# Patient Record
Sex: Male | Born: 1960
Health system: Southern US, Community
[De-identification: ages and names within clinical notes are randomized; demographics above are authoritative.]

## PROBLEM LIST (undated history)

## (undated) ENCOUNTER — Other Ambulatory Visit

## (undated) ENCOUNTER — Ambulatory Visit: Payer: PRIVATE HEALTH INSURANCE

## (undated) ENCOUNTER — Encounter: Attending: Hematology & Oncology | Primary: Hematology & Oncology

## (undated) ENCOUNTER — Ambulatory Visit

## (undated) ENCOUNTER — Telehealth: Attending: Hematology & Oncology | Primary: Hematology & Oncology

## (undated) ENCOUNTER — Encounter: Attending: Oncology | Primary: Oncology

## (undated) ENCOUNTER — Ambulatory Visit: Payer: BLUE CROSS/BLUE SHIELD

## (undated) ENCOUNTER — Encounter

## (undated) ENCOUNTER — Telehealth: Attending: Pulmonary Disease | Primary: Pulmonary Disease

## (undated) ENCOUNTER — Ambulatory Visit: Payer: PRIVATE HEALTH INSURANCE | Attending: Hematology & Oncology | Primary: Hematology & Oncology

## (undated) ENCOUNTER — Encounter: Attending: Adult Health | Primary: Adult Health

## (undated) ENCOUNTER — Telehealth

## (undated) ENCOUNTER — Encounter: Attending: Surgical Oncology | Primary: Surgical Oncology

## (undated) ENCOUNTER — Encounter: Attending: General Practice | Primary: General Practice

## (undated) ENCOUNTER — Ambulatory Visit: Payer: PRIVATE HEALTH INSURANCE | Attending: General Practice | Primary: General Practice

## (undated) ENCOUNTER — Ambulatory Visit: Payer: BLUE CROSS/BLUE SHIELD | Attending: Oncology | Primary: Oncology

## (undated) ENCOUNTER — Encounter: Attending: Radiation Oncology | Primary: Radiation Oncology

## (undated) ENCOUNTER — Telehealth: Attending: Oncology | Primary: Oncology

## (undated) ENCOUNTER — Ambulatory Visit: Payer: BLUE CROSS/BLUE SHIELD | Attending: Hematology & Oncology | Primary: Hematology & Oncology

## (undated) ENCOUNTER — Ambulatory Visit: Payer: PRIVATE HEALTH INSURANCE | Attending: Surgical Oncology | Primary: Surgical Oncology

## (undated) ENCOUNTER — Ambulatory Visit: Payer: PRIVATE HEALTH INSURANCE | Attending: Pulmonary Disease | Primary: Pulmonary Disease

## (undated) DIAGNOSIS — K219 Gastro-esophageal reflux disease without esophagitis: Secondary | ICD-10-CM

## (undated) DIAGNOSIS — I2510 Atherosclerotic heart disease of native coronary artery without angina pectoris: Secondary | ICD-10-CM

## (undated) DIAGNOSIS — Z789 Other specified health status: Secondary | ICD-10-CM

## (undated) DIAGNOSIS — F1090 Alcohol use, unspecified, uncomplicated: Secondary | ICD-10-CM

## (undated) DIAGNOSIS — C140 Malignant neoplasm of pharynx, unspecified: Secondary | ICD-10-CM

## (undated) DIAGNOSIS — Z87891 Personal history of nicotine dependence: Secondary | ICD-10-CM

## (undated) DIAGNOSIS — G4700 Insomnia, unspecified: Secondary | ICD-10-CM

## (undated) DIAGNOSIS — R269 Unspecified abnormalities of gait and mobility: Secondary | ICD-10-CM

## (undated) DIAGNOSIS — I639 Cerebral infarction, unspecified: Secondary | ICD-10-CM

## (undated) DIAGNOSIS — I10 Essential (primary) hypertension: Secondary | ICD-10-CM

## (undated) DIAGNOSIS — M1990 Unspecified osteoarthritis, unspecified site: Secondary | ICD-10-CM

## (undated) DIAGNOSIS — M1120 Other chondrocalcinosis, unspecified site: Secondary | ICD-10-CM

## (undated) DIAGNOSIS — Z7289 Other problems related to lifestyle: Secondary | ICD-10-CM

## (undated) DIAGNOSIS — S62524A Nondisplaced fracture of distal phalanx of right thumb, initial encounter for closed fracture: Secondary | ICD-10-CM

## (undated) DIAGNOSIS — M109 Gout, unspecified: Secondary | ICD-10-CM

## (undated) DIAGNOSIS — E039 Hypothyroidism, unspecified: Secondary | ICD-10-CM

## (undated) HISTORY — DX: Cerebral infarction, unspecified: I63.9

## (undated) HISTORY — DX: Other problems related to lifestyle: Z72.89

## (undated) HISTORY — DX: Insomnia, unspecified: G47.00

## (undated) HISTORY — DX: Personal history of nicotine dependence: Z87.891

## (undated) HISTORY — DX: Other specified health status: Z78.9

## (undated) HISTORY — DX: Unspecified abnormalities of gait and mobility: R26.9

## (undated) HISTORY — PX: HAND SURGERY: SHX662

## (undated) HISTORY — DX: Alcohol use, unspecified, uncomplicated: F10.90

## (undated) HISTORY — DX: Hypothyroidism, unspecified: E03.9

## (undated) HISTORY — PX: KNEE SURGERY: SHX244

## (undated) HISTORY — DX: Essential (primary) hypertension: I10

## (undated) HISTORY — DX: Other chondrocalcinosis, unspecified site: M11.20

## (undated) HISTORY — DX: Malignant neoplasm of pharynx, unspecified: C14.0

---
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
## 1999-01-23 ENCOUNTER — Emergency Department (HOSPITAL_COMMUNITY): Admission: EM | Admit: 1999-01-23 | Discharge: 1999-01-23 | Payer: Self-pay | Admitting: Emergency Medicine

## 2000-10-11 ENCOUNTER — Encounter: Payer: Self-pay | Admitting: Cardiology

## 2000-10-11 ENCOUNTER — Ambulatory Visit (HOSPITAL_COMMUNITY): Admission: RE | Admit: 2000-10-11 | Discharge: 2000-10-11 | Payer: Self-pay | Admitting: Cardiology

## 2001-12-04 ENCOUNTER — Encounter: Payer: Self-pay | Admitting: Emergency Medicine

## 2001-12-04 ENCOUNTER — Emergency Department (HOSPITAL_COMMUNITY): Admission: EM | Admit: 2001-12-04 | Discharge: 2001-12-04 | Payer: Self-pay | Admitting: Emergency Medicine

## 2001-12-06 ENCOUNTER — Encounter: Payer: Self-pay | Admitting: Emergency Medicine

## 2001-12-06 ENCOUNTER — Encounter: Payer: Self-pay | Admitting: Orthopedic Surgery

## 2001-12-06 ENCOUNTER — Emergency Department (HOSPITAL_COMMUNITY): Admission: EM | Admit: 2001-12-06 | Discharge: 2001-12-06 | Payer: Self-pay | Admitting: Emergency Medicine

## 2003-03-29 ENCOUNTER — Emergency Department (HOSPITAL_COMMUNITY): Admission: EM | Admit: 2003-03-29 | Discharge: 2003-03-29 | Payer: Self-pay | Admitting: *Deleted

## 2003-09-29 ENCOUNTER — Emergency Department (HOSPITAL_COMMUNITY): Admission: EM | Admit: 2003-09-29 | Discharge: 2003-09-29 | Payer: Self-pay | Admitting: Emergency Medicine

## 2003-10-06 ENCOUNTER — Observation Stay (HOSPITAL_COMMUNITY): Admission: RE | Admit: 2003-10-06 | Discharge: 2003-10-07 | Payer: Self-pay | Admitting: Orthopedic Surgery

## 2004-04-21 ENCOUNTER — Emergency Department (HOSPITAL_COMMUNITY): Admission: EM | Admit: 2004-04-21 | Discharge: 2004-04-21 | Payer: Self-pay | Admitting: Emergency Medicine

## 2004-04-29 ENCOUNTER — Emergency Department (HOSPITAL_COMMUNITY): Admission: EM | Admit: 2004-04-29 | Discharge: 2004-04-29 | Payer: Self-pay | Admitting: Family Medicine

## 2004-06-13 ENCOUNTER — Ambulatory Visit: Payer: Self-pay | Admitting: Family Medicine

## 2004-11-11 ENCOUNTER — Ambulatory Visit: Payer: Self-pay | Admitting: Family Medicine

## 2005-07-08 ENCOUNTER — Ambulatory Visit: Payer: Self-pay | Admitting: Family Medicine

## 2006-03-01 ENCOUNTER — Ambulatory Visit: Payer: Self-pay | Admitting: Family Medicine

## 2006-03-17 ENCOUNTER — Ambulatory Visit: Payer: Self-pay | Admitting: Family Medicine

## 2006-12-20 ENCOUNTER — Ambulatory Visit: Payer: Self-pay | Admitting: Family Medicine

## 2006-12-20 DIAGNOSIS — M25519 Pain in unspecified shoulder: Secondary | ICD-10-CM | POA: Insufficient documentation

## 2008-05-30 ENCOUNTER — Ambulatory Visit: Payer: Self-pay | Admitting: Family Medicine

## 2008-05-30 DIAGNOSIS — Z85819 Personal history of malignant neoplasm of unspecified site of lip, oral cavity, and pharynx: Secondary | ICD-10-CM | POA: Insufficient documentation

## 2008-05-30 DIAGNOSIS — C140 Malignant neoplasm of pharynx, unspecified: Secondary | ICD-10-CM | POA: Insufficient documentation

## 2008-05-31 ENCOUNTER — Encounter (INDEPENDENT_AMBULATORY_CARE_PROVIDER_SITE_OTHER): Payer: Self-pay | Admitting: Internal Medicine

## 2008-06-01 ENCOUNTER — Encounter (INDEPENDENT_AMBULATORY_CARE_PROVIDER_SITE_OTHER): Payer: Self-pay | Admitting: Internal Medicine

## 2008-07-24 ENCOUNTER — Ambulatory Visit: Payer: Self-pay | Admitting: Family Medicine

## 2008-07-24 DIAGNOSIS — F5104 Psychophysiologic insomnia: Secondary | ICD-10-CM | POA: Insufficient documentation

## 2008-07-24 DIAGNOSIS — G4700 Insomnia, unspecified: Secondary | ICD-10-CM

## 2008-07-24 DIAGNOSIS — F411 Generalized anxiety disorder: Secondary | ICD-10-CM | POA: Insufficient documentation

## 2008-07-24 DIAGNOSIS — B009 Herpesviral infection, unspecified: Secondary | ICD-10-CM | POA: Insufficient documentation

## 2008-08-03 HISTORY — PX: THROAT SURGERY: SHX803

## 2008-08-24 ENCOUNTER — Encounter (INDEPENDENT_AMBULATORY_CARE_PROVIDER_SITE_OTHER): Payer: Self-pay | Admitting: *Deleted

## 2008-11-01 DIAGNOSIS — Z85819 Personal history of malignant neoplasm of unspecified site of lip, oral cavity, and pharynx: Secondary | ICD-10-CM | POA: Insufficient documentation

## 2008-11-01 DIAGNOSIS — C01 Malignant neoplasm of base of tongue: Secondary | ICD-10-CM | POA: Insufficient documentation

## 2009-02-05 ENCOUNTER — Ambulatory Visit: Payer: Self-pay | Admitting: Family Medicine

## 2009-02-05 DIAGNOSIS — F4321 Adjustment disorder with depressed mood: Secondary | ICD-10-CM | POA: Insufficient documentation

## 2009-02-05 DIAGNOSIS — Z87891 Personal history of nicotine dependence: Secondary | ICD-10-CM

## 2009-02-05 DIAGNOSIS — Z789 Other specified health status: Secondary | ICD-10-CM | POA: Insufficient documentation

## 2009-02-05 DIAGNOSIS — Z7289 Other problems related to lifestyle: Secondary | ICD-10-CM | POA: Insufficient documentation

## 2009-02-05 DIAGNOSIS — F1010 Alcohol abuse, uncomplicated: Secondary | ICD-10-CM | POA: Insufficient documentation

## 2009-02-05 HISTORY — DX: Personal history of nicotine dependence: Z87.891

## 2009-02-15 ENCOUNTER — Encounter (INDEPENDENT_AMBULATORY_CARE_PROVIDER_SITE_OTHER): Payer: Self-pay | Admitting: Internal Medicine

## 2009-03-20 ENCOUNTER — Telehealth (INDEPENDENT_AMBULATORY_CARE_PROVIDER_SITE_OTHER): Payer: Self-pay | Admitting: Internal Medicine

## 2009-03-26 ENCOUNTER — Ambulatory Visit: Payer: Self-pay | Admitting: Family Medicine

## 2009-04-18 ENCOUNTER — Telehealth (INDEPENDENT_AMBULATORY_CARE_PROVIDER_SITE_OTHER): Payer: Self-pay | Admitting: Internal Medicine

## 2009-06-06 ENCOUNTER — Ambulatory Visit: Payer: Self-pay | Admitting: Family Medicine

## 2010-08-03 HISTORY — PX: SKIN LESION EXCISION: SHX2412

## 2010-08-13 ENCOUNTER — Ambulatory Visit
Admission: RE | Admit: 2010-08-13 | Discharge: 2010-08-13 | Payer: Self-pay | Source: Home / Self Care | Attending: Family Medicine | Admitting: Family Medicine

## 2010-08-13 DIAGNOSIS — I10 Essential (primary) hypertension: Secondary | ICD-10-CM | POA: Insufficient documentation

## 2010-09-04 NOTE — Assessment & Plan Note (Signed)
Summary: ESTABLISH FROM BILLIE/HAS SPOT ON FORHEAD/RBH   Vital Signs:  Patient profile:   50 year old male Weight:      186.25 pounds BMI:     26.82 Temp:     98.4 degrees F oral Pulse rate:   96 / minute Pulse rhythm:   regular BP sitting:   140 / 100  (left arm) Cuff size:   large  Vitals Entered By: Selena Batten Dance CMA (AAMA) (August 13, 2010 11:32 AM) CC: Check place on forehead, Hypertension Management Comments Bump started as pimple. Patient "dug it out" and it bled and has now turned into a large bump. Been present x2 weeks   History of Present Illness: CC: check place on forehead  3wk h/o bump R forehead.  started as pimple, "dug out" and started bleeding.  Hasn't healed since.  wears hat and sweats which irritates it.  No fevers/chills, redness, not tender.  insomnia - uses wife's sleep med - mylin?? which really helps.  ambien didn't help, melatonin didn't help.  h/o sleep problems since age 5yo.  thinks needs background noise to sleep, sleeps with TV on, not amenable to change that.  quit smoking when dx with throat CA (s/p surgery and doing well).  wife still smokes.  continues to drink, up to 6 pack in one sitting, but not daily thing.    due for CPE.  bp up but worried about coming to doctor and irritated at skin lesion not goign away.  [facility], sbp running 120-130s, dbp running 80-90s.  Current Medications (verified): 1)  Valtrex 1 Gm Tabs (Valacyclovir Hcl) .Marland Kitchen.. 1 Once Daily For Facial Herpes By Mouth As Needed. 2)  Multivitamins   Tabs (Multiple Vitamin) .... One Daily 3)  Fish Oil   Oil (Fish Oil) .... Otc One Daily 4)  Levothyroxine Sodium 25 Mcg Tabs (Levothyroxine Sodium) .... Takes One Tablet Daily  Allergies (verified): No Known Drug Allergies  Past History:  Past Medical History: throat cancer [Dr. Prince Solian [facility]] insomnia  Past Surgical History: Oral Surgeon s/p cancer removal [[facility]] 2010  Exercise treadmill/ cardiolite- neg  (10/2000)  Family History: Father: CAD, HTN Mother:  sister: helathy brother: died age 50 mos from brain tumor  Social History: quit smoking 2010 Marital Status: married, 1 dog Children: none Occupation: self employed--[employer]  Review of Systems       per HPI  Physical Exam  General:  alert, well-developed, well-nourished, and well-hydrated.  7 lb wt gain since 03/26/2009 Head:  1cm growth (erythematous, dome shaped) on R forehead, + dry blood around.     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  on face, referral to derm for eval/removal.    Orders: Dermatology Referral (Derma)  Problem # 2:  INSOMNIA (ICD-780.52)  longterm issue.  with poor sleep hygiene.  unsure what he is currently using.  does not seem amenable to improving sleep hygiene.  monitor for now.  ambien didn't help, melatonin didn't help.    The following medications were removed from the medication list:    Ambien 10 Mg Tabs (Zolpidem tartrate) .Marland Kitchen... 1/2 to 1 at bedtime for sleep  The following medications were removed from the medication list:    Ambien 10 Mg Tabs (Zolpidem tartrate) .Marland Kitchen... 1/2 to 1 at bedtime for sleep  Problem # 3:  Hx of THROAT CANCER (ICD-149.0) throat or tongue, unclear.  seems in remission.  Problem # 4:  ELEVATED BP READING WITHOUT  DX HYPERTENSION (ICD-796.2) recheck at CPE.  Complete Medication List: 1)  Valtrex 1 Gm Tabs (Valacyclovir hcl) .Marland Kitchen.. 1 once daily for facial herpes by mouth as needed. 2)  Multivitamins Tabs (Multiple vitamin) .... One daily 3)  Fish Oil Oil (Fish oil) .... Otc one daily 4)  Levothyroxine Sodium 25 Mcg Tabs (Levothyroxine sodium) .... Takes one tablet daily   Patient Instructions: 1)  looks like a Pyogenic granuloma 2)  referral to derm for evaluation. 3)  pass by Marion's office for appointment. 4)  Return at your convenience for complete physical   Orders Added: 1)  Est. Patient Level III  [16109] 2)  Dermatology Referral [Derma]    Current Allergies (reviewed today): No known allergies

## 2010-09-08 ENCOUNTER — Encounter: Payer: Self-pay | Admitting: Family Medicine

## 2010-09-08 ENCOUNTER — Ambulatory Visit: Payer: Self-pay | Admitting: Family Medicine

## 2010-09-09 ENCOUNTER — Ambulatory Visit: Payer: Self-pay

## 2010-09-16 ENCOUNTER — Ambulatory Visit (INDEPENDENT_AMBULATORY_CARE_PROVIDER_SITE_OTHER): Payer: Self-pay | Admitting: Family Medicine

## 2010-09-16 ENCOUNTER — Encounter: Payer: Self-pay | Admitting: Family Medicine

## 2010-09-16 DIAGNOSIS — I10 Essential (primary) hypertension: Secondary | ICD-10-CM

## 2010-09-18 NOTE — Assessment & Plan Note (Signed)
Summary: b/p check/ could not use nurse schedule jrt   Vital Signs:  Patient profile:   50 year old male Height:      70 inches Weight:      186.25 pounds BMI:     26.82 Pulse rate:   90 / minute Pulse rhythm:   regular BP sitting:   152 / 88  (left arm) Cuff size:   large  Vitals Entered By: Delilah Shan CMA (AAMA) (September 08, 2010 9:15 AM) CC: BP Check - Patient is making an appointment within a few weeks   History of Present Illness: staying high.  likely will need med.  will discuss at OV. Patrick Boyden  MD  September 08, 2010 9:51 AM   Allergies: No Known Drug Allergies   Complete Medication List: 1)  Valtrex 1 Gm Tabs (Valacyclovir hcl) .Marland Kitchen.. 1 once daily for facial herpes by mouth as needed. 2)  Multivitamins Tabs (Multiple vitamin) .... One daily 3)  Fish Oil Oil (Fish oil) .... Otc one daily 4)  Levothyroxine Sodium 25 Mcg Tabs (Levothyroxine sodium) .... Takes one tablet daily

## 2010-09-22 ENCOUNTER — Ambulatory Visit: Payer: Self-pay | Admitting: Family Medicine

## 2010-09-24 NOTE — Letter (Signed)
Summary: Out of Work  [facility] [employer] [facility]  [address] [place], [place] [address]   Phone: [phone]  Fax: [phone]    September 16, 2010   Employee:  Patrick Bailey    To Whom It May Concern:   For Medical reasons, please excuse the above named employee from work for the following dates:  Start:  September 16, 2010   End:  September 16, 2010   If you need additional information, please feel free to contact our office.         Sincerely,    Eustaquio Boyden  MD

## 2010-09-24 NOTE — Assessment & Plan Note (Signed)
Summary: blood pressure elevated/alc   Vital Signs:  Patient profile:   50 year old male Weight:      186 pounds Temp:     98.3 degrees F oral Pulse rate:   96 / minute Pulse rhythm:   regular BP sitting:   132 / 96  (left arm) Cuff size:   large  Vitals Entered By: Selena Batten Dance CMA (AAMA) (September 16, 2010 4:09 PM) CC: B/P has been elevated   History of Present Illness: CC: f/u BP  1. forehead spot - seen by derm, removed.  presumed pyogenic granuloma.  2. HTN - no HA, vision changes, chest pain, tightness, SOB, urinary changes, LE swelling.  Previosulsy blood pressure was 150/100, now started more water, less salt and brought down alone to 130/90s.  very motivated to continue diet and asks about what foods will help keep bp down, wants to try without medications.  Current Medications (verified): 1)  Fish Oil   Oil (Fish Oil) .... Otc One Daily 2)  Levothyroxine Sodium 25 Mcg Tabs (Levothyroxine Sodium) .... Takes One Tablet Daily 3)  Aspirin 81 Mg Tbec (Aspirin) .Marland Kitchen.. 1 By Mouth Once Daily  Allergies (verified): No Known Drug Allergies  Past History:  Social History: Last updated: 08/13/2010 quit smoking 2010 Marital Status: married, 1 dog Children: none Occupation: self employed--[employer]  Past Medical History: throat cancer [Dr. Prince Solian [facility]] insomnia HTN  Past Surgical History: Oral Surgeon s/p cancer removal [[facility]] 2010 Forehead growth removed [[facility]] 08/2010  Exercise treadmill/ cardiolite- neg (10/2000)  Review of Systems       per HPI  Physical Exam  General:  alert, well-developed, well-nourished, and well-hydrated.  Head:  healed scar forehead Mouth:  Oral mucosa and oropharynx without lesions or exudates. MMM Neck:  No deformities, masses, or tenderness noted.  no LAD, no carotid bruits.  residual scar R jawline from surgery Lungs:  Normal respiratory effort, chest expands symmetrically. Lungs are clear to auscultation, no crackles or  wheezes. Heart:  Normal rate and regular rhythm. S1 and S2 normal without gallop, murmur, click, rub or other extra sounds. Pulses:  2+ rad pulses, brisk cap refill Extremities:  no pedal edema   Impression & Recommendations:  Problem # 1:  ESSENTIAL HYPERTENSION (ICD-401.9) Assessment New improved with diet changes.  pt motivated to continue this , hopeful not to need meds.  concerned about sexual dysfunction side effect.  discussed low salt, high potassium, more water and mediterannean diet for overall health.  has cut back on caffeine as well as alcohol.  if needs med, will likely start B blocker as patient has rapid HR at baseline.  BP today: 132/96 Prior BP: 152/88 (09/08/2010)  Prior 10 Yr Risk Heart Disease: Not enough information (08/13/2010)  Problem # 2:  ALCOHOL ABUSE (ICD-305.00) encouraged to reduce alchol intake to help blood pressure.  Complete Medication List: 1)  Fish Oil Oil (Fish oil) .... Otc one daily 2)  Levothyroxine Sodium 25 Mcg Tabs (Levothyroxine sodium) .... Takes one tablet daily 3)  Aspirin 81 Mg Tbec (Aspirin) .Marland Kitchen.. 1 by mouth once daily  Patient Instructions: 1)  return in 1 month for follow up.  sooner if blood pressure running too high.  (cancel 2 wk f/u appointment) 2)  fax latest blood work results to our office at [phone] 3)  Back away from salt (sodium).  more water.  more potassium (like in fruits and vegetables).  Apple, pear, banana, carrots. 4)  Mediterranean diet. 5)  Keep track of  blood pressures.  high blood pressure is >140/90.  pre-high blood pressure is 120-140/80-90.  normal is <120/80. 6)  good to see you today, call clinic with questions   Orders Added: 1)  Est. Patient Level III [16109]    Current Allergies (reviewed today): No known allergies

## 2010-09-29 ENCOUNTER — Encounter: Payer: Self-pay | Admitting: Family Medicine

## 2010-10-13 ENCOUNTER — Ambulatory Visit: Payer: Self-pay | Admitting: Family Medicine

## 2010-10-24 ENCOUNTER — Encounter: Payer: Self-pay | Admitting: Family Medicine

## 2010-10-24 ENCOUNTER — Ambulatory Visit (INDEPENDENT_AMBULATORY_CARE_PROVIDER_SITE_OTHER): Payer: Self-pay | Admitting: Family Medicine

## 2010-10-24 VITALS — BP 126/90 | HR 110 | Temp 98.4°F | Wt 181.0 lb

## 2010-10-24 DIAGNOSIS — M25469 Effusion, unspecified knee: Secondary | ICD-10-CM

## 2010-10-24 DIAGNOSIS — M25461 Effusion, right knee: Secondary | ICD-10-CM

## 2010-10-24 DIAGNOSIS — I10 Essential (primary) hypertension: Secondary | ICD-10-CM

## 2010-10-24 MED ORDER — METOPROLOL TARTRATE 50 MG PO TABS: 50.0000 mg | ORAL_TABLET | Freq: Two times a day (BID) | ORAL | Status: DC

## 2010-10-24 NOTE — Progress Notes (Signed)
  Subjective:    Patient ID: Patrick Bailey, male    DOB: [birth year], 50 y.o.   MRN: [id number]  HPI CC: f/u BP, check knee.  1. HTN - No HA, vision changes, CP/tightness, SOB, leg swelling.  Eating more bananas, more water.  BP running 140-160/95-103 at home.  HR 110 today, anxiety about docs, also rushing to get in today.  But does note that HR tends to run high.  Has lost 5 lbs since last visit. Wt Readings from Last 3 Encounters:  10/24/10 181 lb 0.6 oz (82.119 kg)  09/16/10 186 lb (84.369 kg)  09/08/10 186 lb 4 oz (84.482 kg)    2. Knee - L knee swollen for last month.  Has had penetrating nail injury and chainsaw injury in remote past, none recently.  Not hurting.  Just more stiff.  Feels tight with flexion.  Just wanted to mention this today, however doesn't want extensive work up currently.  Review of Systems No fevers/chills, NS.  Appetite ok. Per HPI    Objective:   Physical Exam  Constitutional: He appears well-developed and well-nourished. No distress.  HENT:  Head: Normocephalic and atraumatic.  Mouth/Throat: Oropharynx is clear and moist.  Eyes: Conjunctivae and EOM are normal. Pupils are equal, round, and reactive to light.  Cardiovascular: Normal rate, normal heart sounds and intact distal pulses.  Exam reveals no gallop.   No murmur heard. Pulmonary/Chest: Effort normal and breath sounds normal. No respiratory distress. He has no wheezes. He has no rales.  Abdominal: Soft. He exhibits no distension and no mass. There is no tenderness. There is no rebound and no guarding.       No abd/renal bruit  Musculoskeletal:       Left knee: He exhibits decreased range of motion, swelling and effusion. He exhibits no erythema and normal patellar mobility. no tenderness found.       Neg mcmurray's bilaterally Neg drawer sign  Skin: Skin is warm and dry. No rash noted.          Assessment & Plan:

## 2010-10-24 NOTE — Patient Instructions (Signed)
For knee - ice to it, try tylenol if any pain.  If starts bothering you, let me know or if decreased mobility. For blood pressure - start metoprolol twice daily.  Once in am and one at night. Will help with blood pressure and with heart rate. Keep track of blood pressures. Return to see me in 3 months.

## 2010-10-25 ENCOUNTER — Encounter: Payer: Self-pay | Admitting: Family Medicine

## 2010-10-25 DIAGNOSIS — M25462 Effusion, left knee: Secondary | ICD-10-CM | POA: Insufficient documentation

## 2010-10-25 NOTE — Assessment & Plan Note (Signed)
asxs currently, pain free, but there is effusion present.   Currently does not desire to pursue.  If decides to pursue, would start with Xray to evaluate joint, consider uric acid, consider aspirating to send fluid for analysis.

## 2010-10-25 NOTE — Assessment & Plan Note (Signed)
BP stable today, diastolic a bit elevated. However, pt states running too high at home.   Amenable to starting metoprolol (to help with heart rate as well).  Hesitant about phamacotherapy mainly 2/2 concern about sexual dysfunction side effect. Continue watching diet, low salt, more potassium/water. F/u 3 mo, sooner if concerns.
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
## 2011-01-26 ENCOUNTER — Ambulatory Visit: Payer: Self-pay | Admitting: Family Medicine

## 2011-01-27 ENCOUNTER — Ambulatory Visit: Payer: Self-pay | Admitting: Family Medicine

## 2011-01-27 DIAGNOSIS — Z0289 Encounter for other administrative examinations: Secondary | ICD-10-CM

## 2011-03-05 ENCOUNTER — Other Ambulatory Visit: Payer: Self-pay | Admitting: *Deleted

## 2011-03-05 MED ORDER — METOPROLOL TARTRATE 50 MG PO TABS: 50.0000 mg | ORAL_TABLET | Freq: Two times a day (BID) | ORAL | Status: DC

## 2011-04-21 ENCOUNTER — Other Ambulatory Visit: Payer: Self-pay | Admitting: *Deleted

## 2011-04-21 MED ORDER — AMITRIPTYLINE HCL 50 MG PO TABS: 50.0000 mg | ORAL_TABLET | Freq: Every day | ORAL | Status: DC

## 2011-04-21 NOTE — Telephone Encounter (Signed)
Sent in.  plz notify pt.  Also due for f/u HTN.

## 2011-04-21 NOTE — Telephone Encounter (Signed)
Phoned request from patient.  Uses [facility] [address]. 

## 2011-04-21 NOTE — Telephone Encounter (Signed)
Rx called in as directed. Attempted to call patient to schedule follow up. Home # is incorrect and cell phone was disconnected. Will try to contact patient later.

## 2011-04-23 NOTE — Telephone Encounter (Signed)
Cell phone still disconnected. Will try again later.

## 2011-04-28 NOTE — Telephone Encounter (Signed)
Phone still out of order. Will send letter to schedule appointment.

## 2011-06-06 ENCOUNTER — Other Ambulatory Visit: Payer: Self-pay | Admitting: Family Medicine

## 2011-09-22 ENCOUNTER — Telehealth: Payer: Self-pay | Admitting: *Deleted

## 2011-09-22 NOTE — Telephone Encounter (Signed)
Noted thanks °

## 2011-09-22 NOTE — Telephone Encounter (Signed)
Patient called and said his dentist told him his BP was elevated and that he needed to see his PCP for eval and possible med change. Appt scheduled.

## 2011-09-25 ENCOUNTER — Encounter: Payer: Self-pay | Admitting: Family Medicine

## 2011-09-25 ENCOUNTER — Ambulatory Visit (INDEPENDENT_AMBULATORY_CARE_PROVIDER_SITE_OTHER): Payer: Self-pay | Admitting: Family Medicine

## 2011-09-25 DIAGNOSIS — Z87891 Personal history of nicotine dependence: Secondary | ICD-10-CM

## 2011-09-25 DIAGNOSIS — I10 Essential (primary) hypertension: Secondary | ICD-10-CM

## 2011-09-25 MED ORDER — METOPROLOL TARTRATE 100 MG PO TABS: 100.0000 mg | ORAL_TABLET | Freq: Two times a day (BID) | ORAL | Status: DC

## 2011-09-25 NOTE — Patient Instructions (Addendum)
Your blood pressure is high. Double up on blood pressure medicine until you run out (100mg  twice daily). Call me in 2-3 weeks with an update on how blood pressures are doing - if too high still we will add a 2nd medicine. Increase fruits and vegetables. Good to see you today! Call us with questions. Ask Dr. Lyndee Leo to send me a copy of blood work.

## 2011-09-25 NOTE — Progress Notes (Signed)
  Subjective:    Patient ID: Patrick Bailey, male    DOB: [birth year], 51 y.o.   MRN: [id number]  HPI CC: HTN   BP has been starting to creep up for last several months.  Takes metoprolol 50mg  bid.  Cuff at home with bp running 150-160 / 93-103.  More stress recently - separated from wife, looking like divorce.  "i'm heartbroken"  H/o throat cancer, last saw Dr. Prince Solian several months ago.  Quit smoking in 2011.  EtOH - a couple twelve packs on weekend.  Caffeine - 2 cups coffee/day, 3-4 cups soda.  Avoids salt.  Good fruits/vegetables daily.  Drinks plenty of water during day.  No HA, vision changes, CP/tightness, SOB, leg swelling.  No skin changes or hair changes, diarrhea/constipation.  BP Readings from Last 3 Encounters:  09/25/11 150/100  10/24/10 126/90  09/16/10 132/96   Past Medical History  Diagnosis Date  . HTN (hypertension)   . Insomnia   . Throat cancer     Dr. Prince Solian [facility])   Review of Systems Per HPI    Objective:   Physical Exam  Nursing note and vitals reviewed. Constitutional: He appears well-developed and well-nourished. No distress.  HENT:  Head: Normocephalic and atraumatic.  Mouth/Throat: Oropharynx is clear and moist. No oropharyngeal exudate.  Eyes: Conjunctivae and EOM are normal. Pupils are equal, round, and reactive to light. No scleral icterus.  Neck: Normal range of motion. Neck supple. Carotid bruit is not present.  Cardiovascular: Normal rate, regular rhythm, normal heart sounds and intact distal pulses.   No murmur heard. Pulmonary/Chest: Breath sounds normal. No respiratory distress. He has no wheezes. He has no rales.  Abdominal: Soft. There is no tenderness.       No abd/renal bruits  Skin: Skin is warm and dry. No rash noted.       Assessment & Plan:

## 2011-09-25 NOTE — Assessment & Plan Note (Signed)
Not seen here since 10/25/2010. bp has been creeping up. Only on metoprolol. Will increase metoprolol to 100mg  bid. Pt to call me in 2-3 wks with update, if staying consentently >140/90, will add on amlodipine. Pt will ask surgeon to fax me results of upcoming blood work.

## 2011-10-06 ENCOUNTER — Other Ambulatory Visit: Payer: Self-pay | Admitting: Family Medicine

## 2011-10-06 ENCOUNTER — Telehealth: Payer: Self-pay | Admitting: *Deleted

## 2011-10-06 MED ORDER — AMLODIPINE BESYLATE 5 MG PO TABS: 5.0000 mg | ORAL_TABLET | Freq: Every day | ORAL | Status: DC

## 2011-10-06 NOTE — Telephone Encounter (Signed)
As per plan from last OV, started amlodipine. Pt will call me in 1-2 wks with update.

## 2011-10-06 NOTE — Telephone Encounter (Signed)
Patient came to the office concerned about his elevated BP. Patient stated that he checked his BP yesterday at [facility] and it was 190/115 and pulse was 111. Patient stated that his face was red and felt like it was on fire. Patient stated that his Metoprolol was increased to 2 pills twice a day recently.  Patient's BP at this time is 140/100 and pulse is 80. Patient put in an exam room for Dr. Sharen Hones to talk with him about his BP.

## 2011-10-13 ENCOUNTER — Ambulatory Visit (INDEPENDENT_AMBULATORY_CARE_PROVIDER_SITE_OTHER): Payer: Self-pay | Admitting: Family Medicine

## 2011-10-13 ENCOUNTER — Encounter: Payer: Self-pay | Admitting: Family Medicine

## 2011-10-13 VITALS — BP 148/106 | HR 88 | Temp 98.0°F | Wt 198.0 lb

## 2011-10-13 DIAGNOSIS — I10 Essential (primary) hypertension: Secondary | ICD-10-CM

## 2011-10-13 MED ORDER — AMLODIPINE BESYLATE 10 MG PO TABS: 10.0000 mg | ORAL_TABLET | Freq: Every day | ORAL | Status: DC

## 2011-10-13 NOTE — Progress Notes (Signed)
  Subjective:    Patient ID: Patrick Bailey, male    DOB: [birth year], 51 y.o.   MRN: [id number]  HPI CC: HTN  Seen here last month with increasing blood pressure - see prior note. Increased metoprolol to 100mg  bid. bp not improving, so added on amlodipine at 5mg  last week (10/06/2011).  Returns today as walk in because bp has been running elevated at home - this morning at home cuff bp 129/115 then again at [facility] 145/105.  130/90s in office today.  Has started taking garlic pill to see if will help blood pressure.  Gets fruits/vegetables daily.  Doesn't drink much water.  Still drinks beer 12 packs on weekends.  BP Readings from Last 3 Encounters:  10/13/11 138/94  09/25/11 150/100  10/24/10 126/90   No HA, vision changes, CP/tightness, SOB, leg swelling.  No blood work recently or EKG - had been holding off 2/2 no insurance.  Review of Systems Per HPI    Objective:   Physical Exam  Nursing note and vitals reviewed. Constitutional: He appears well-developed and well-nourished. No distress.  HENT:  Head: Normocephalic and atraumatic.  Mouth/Throat: Oropharynx is clear and moist. No oropharyngeal exudate.  Eyes: Conjunctivae and EOM are normal. Pupils are equal, round, and reactive to light. No scleral icterus.  Neck: Normal range of motion. Neck supple. Carotid bruit is not present.  Cardiovascular: Normal rate, regular rhythm, normal heart sounds and intact distal pulses.   No murmur heard. Pulmonary/Chest: Effort normal and breath sounds normal. No respiratory distress. He has no wheezes. He has no rales.  Abdominal: Soft. There is no tenderness.       No abd/renal bruits  Musculoskeletal: He exhibits no edema.  Skin: Skin is warm and dry. No rash noted.       Assessment & Plan:

## 2011-10-13 NOTE — Assessment & Plan Note (Signed)
Diastolic blood pressure staying elevated - will increase amlodipine to 10mg  daily.  Compliant with meds, noted some improvement but still not at goal. ?increasd blood vessel compliance. Consider addition of diuretic and backing off beta blocker if not at goal BP. rec back off EtOH intake. rtc 1 mo, keep log blood pressures 1 wk prior. EKG today for baseline - normal sinus rhythm at 85 bpm, normal axis, intervals, no hypertrophy or acute ST/T changes. Still awaiting blood work from cancer doctor.

## 2011-10-13 NOTE — Patient Instructions (Signed)
Increase amlodipine to 10mg  daily. EKG today. Return to see me in 1 month.  Keep log of blood pressures 1 week prior to next appointment. Call me with any questions or concerns. Have cancer doctor fax me a copy of latest blood work.

## 2011-11-11 DIAGNOSIS — C109 Malignant neoplasm of oropharynx, unspecified: Secondary | ICD-10-CM | POA: Insufficient documentation

## 2012-05-07 ENCOUNTER — Other Ambulatory Visit: Payer: Self-pay | Admitting: Family Medicine

## 2012-05-27 ENCOUNTER — Encounter: Payer: Self-pay | Admitting: Family Medicine

## 2012-05-27 ENCOUNTER — Ambulatory Visit (INDEPENDENT_AMBULATORY_CARE_PROVIDER_SITE_OTHER): Payer: Self-pay | Admitting: Family Medicine

## 2012-05-27 VITALS — BP 126/88 | HR 84 | Temp 98.1°F | Wt 197.5 lb

## 2012-05-27 DIAGNOSIS — M7020 Olecranon bursitis, unspecified elbow: Secondary | ICD-10-CM | POA: Insufficient documentation

## 2012-05-27 DIAGNOSIS — M7021 Olecranon bursitis, right elbow: Secondary | ICD-10-CM

## 2012-05-27 MED ORDER — NAPROXEN 500 MG PO TABS: ORAL_TABLET | ORAL | Status: DC

## 2012-05-27 NOTE — Progress Notes (Signed)
  Subjective:    Patient ID: Patrick Bailey, male    DOB: [birth year], 51 y.o.   MRN: [id number]  HPI CC: elbow swelling  Elbow swelling - present for last week.  Started after he hit arm on piece of steel - 3 times on same day.    Staying about the same.  Would like to have this removed.  HTN - great control on current regimen. H/o throat cancer - doing well from this perspective.  Saw Dr. Prince Solian.  Last saw 7-8 mo ago.  Doing well. Wt Readings from Last 3 Encounters:  05/27/12 197 lb 8 oz (89.585 kg)  10/13/11 198 lb (89.812 kg)  09/25/11 195 lb (88.451 kg)   Review of Systems Per HPI    Objective:   Physical Exam Swollen olecranon bursa of R.  Not significantly tender to palpation. FROM at elbow.    Assessment & Plan:  IC obtained and in chart.  Area prepped with betadine and EtOH, anesthesia with ethyl chloride.  1 inch 22g needle attached to 20cc syringe used to aspirate 10cc of serosanguinous fluid from R olecranon bursa.  Pt tolerated procedure well.  Dressed in compression bandage.

## 2012-05-27 NOTE — Patient Instructions (Signed)
You had traumatic olecranon bursitis. Keep compression stocking on for next 3 days to prevent reaccumulation. Let us know if concerns or not improving as expected. Treat with naprosyn prescription strength twice daily with food for 5 days.  Olecranon Bursitis Bursitis is swelling and soreness (inflammation) of a fluid-filled sac (bursa) that covers and protects a joint. Olecranon bursitis occurs over the elbow.  CAUSES Bursitis can be caused by injury, overuse of the joint, arthritis, or infection.  SYMPTOMS   Tenderness, swelling, warmth, or redness over the elbow.  Elbow pain with movement. This is greater with bending the elbow.  Squeaking sound when the bursa is rubbed or moved.  Increasing size of the bursa without pain or discomfort.  Fever with increasing pain and swelling if the bursa becomes infected. HOME CARE INSTRUCTIONS   Put ice on the affected area.  Put ice in a plastic bag.  Place a towel between your skin and the bag.  Leave the ice on for 15 to 20 minutes each hour while awake. Do this for the first 2 days.  When resting, elevate your elbow above the level of your heart. This helps reduce swelling.  Continue to put the joint through a full range of motion 4 times per day. Rest the injured joint at other times. When the pain lessens, begin normal slow movements and usual activities.  Only take over-the-counter or prescription medicines for pain, discomfort, or fever as directed by your caregiver.  Reduce your intake of milk and related dairy products (cheese, yogurt). They may make your condition worse. SEEK IMMEDIATE MEDICAL CARE IF:   Your pain increases even during treatment.  You have a fever.  You have heat and inflammation over the bursa and elbow.  You have a red line that goes up your arm.  You have pain with movement of your elbow. MAKE SURE YOU:   Understand these instructions.  Will watch your condition.  Will get help right away if  you are not doing well or get worse. Document Released: 08/19/2006 Document Revised: 10/12/2011 Document Reviewed: 07/05/2007 [facility] Patient Information 2013 [place], [place].

## 2012-05-27 NOTE — Assessment & Plan Note (Addendum)
Traumatic bursitis. Bloody fluid.  Not cloudy. Aspiration performed. Pt tolerated well. Treat with short term NSAID.  Discussed if recurs, may return for steroid injection. lvl 3 charge as no insurance.

## 2012-06-20 ENCOUNTER — Other Ambulatory Visit: Payer: Self-pay | Admitting: Family Medicine

## 2012-10-26 ENCOUNTER — Other Ambulatory Visit: Payer: Self-pay | Admitting: Family Medicine

## 2012-12-15 ENCOUNTER — Other Ambulatory Visit: Payer: Self-pay | Admitting: Family Medicine

## 2012-12-15 NOTE — Telephone Encounter (Signed)
plz phone in. 

## 2012-12-15 NOTE — Telephone Encounter (Signed)
Ok to refill 

## 2012-12-20 ENCOUNTER — Other Ambulatory Visit: Payer: Self-pay | Admitting: Family Medicine

## 2013-01-21 ENCOUNTER — Other Ambulatory Visit: Payer: Self-pay | Admitting: Family Medicine

## 2013-03-27 DIAGNOSIS — C029 Malignant neoplasm of tongue, unspecified: Secondary | ICD-10-CM | POA: Insufficient documentation

## 2013-08-31 ENCOUNTER — Other Ambulatory Visit: Payer: Self-pay | Admitting: Family Medicine

## 2013-08-31 MED ORDER — METOPROLOL TARTRATE 100 MG PO TABS: ORAL_TABLET | ORAL | Status: DC

## 2013-10-01 DIAGNOSIS — M1120 Other chondrocalcinosis, unspecified site: Secondary | ICD-10-CM

## 2013-10-01 HISTORY — DX: Other chondrocalcinosis, unspecified site: M11.20

## 2013-10-21 ENCOUNTER — Other Ambulatory Visit: Payer: Self-pay | Admitting: Family Medicine

## 2013-10-24 ENCOUNTER — Other Ambulatory Visit: Payer: Self-pay | Admitting: Family Medicine

## 2013-10-31 ENCOUNTER — Ambulatory Visit (INDEPENDENT_AMBULATORY_CARE_PROVIDER_SITE_OTHER)
Admission: RE | Admit: 2013-10-31 | Discharge: 2013-10-31 | Disposition: A | Discharge status: Home / Self Care | Payer: BC Managed Care – PPO | Source: Ambulatory Visit | Attending: Family Medicine | Admitting: Family Medicine

## 2013-10-31 ENCOUNTER — Ambulatory Visit (INDEPENDENT_AMBULATORY_CARE_PROVIDER_SITE_OTHER): Payer: BC Managed Care – PPO | Admitting: Family Medicine

## 2013-10-31 ENCOUNTER — Encounter: Payer: Self-pay | Admitting: Family Medicine

## 2013-10-31 VITALS — BP 148/100 | HR 100 | Temp 97.6°F | Wt 200.0 lb

## 2013-10-31 DIAGNOSIS — M79641 Pain in right hand: Secondary | ICD-10-CM

## 2013-10-31 DIAGNOSIS — M1120 Other chondrocalcinosis, unspecified site: Secondary | ICD-10-CM | POA: Insufficient documentation

## 2013-10-31 DIAGNOSIS — C140 Malignant neoplasm of pharynx, unspecified: Secondary | ICD-10-CM

## 2013-10-31 DIAGNOSIS — M79609 Pain in unspecified limb: Secondary | ICD-10-CM

## 2013-10-31 DIAGNOSIS — I10 Essential (primary) hypertension: Secondary | ICD-10-CM

## 2013-10-31 MED ORDER — METOPROLOL TARTRATE 100 MG PO TABS: ORAL_TABLET | ORAL | Status: DC

## 2013-10-31 MED ORDER — AMLODIPINE BESYLATE 10 MG PO TABS: ORAL_TABLET | ORAL | Status: DC

## 2013-10-31 MED ORDER — MELOXICAM 15 MG PO TABS: ORAL_TABLET | ORAL | Status: DC

## 2013-10-31 NOTE — Progress Notes (Signed)
Pre visit review using our clinic review tool, if applicable. No additional management support is needed unless otherwise documented below in the visit note. 

## 2013-10-31 NOTE — Progress Notes (Signed)
BP 148/100  Pulse 100  Temp(Src) 97.6 F (36.4 C) (Tympanic)  Wt 200 lb (90.719 kg)  SpO2 98%   CC: med refill visit  Subjective:    Patient ID: Patrick Bailey, male    DOB: [birth year], 53 y.o.   MRN: [id number]  HPI: GRAY DOERING is a 53 y.o. male presenting on 10/31/2013 for Hypertension and Hand Pain   HTN - ran out of meds over last few weeks so this is why bp elevated.  Usually compliant with current antihypertensive regimen of amlodipine 10mg  daily and metoprolol 100mg  bid.  Does check blood pressures at home: 130-135/90s.  No low blood pressure readings or symptoms of dizziness/syncope.  Denies HA, vision changes, CP/tightness, SOB, leg swelling.    Throat cancer - sees surgeon once yearly. On amitriptyline 50mg  nightly and levothyroxine 52mcg daily by them.  R hand pain - ongoing over last 8 months.  Swelling at 3rd MCP, with stiffness, limits ability to clench fist.  Weakness noted.  No numbness.  No warmth or redness.  So far has tried nothing for this.  No h/o gout.  Father with h/o gout.  No other joint pains.  No new rashes.  Ex smoker - quit 2010  Relevant past medical, surgical, family and social history reviewed and updated as indicated.  Allergies and medications reviewed and updated. Current Outpatient Prescriptions on File Prior to Visit  Medication Sig  . amitriptyline (ELAVIL) 50 MG tablet TAKE ONE TABLET BY MOUTH AT BEDTIME  . aspirin 325 MG tablet Take 325 mg by mouth daily.  . fish oil-omega-3 fatty acids 1000 MG capsule Take 1 g by mouth daily.    Marland Kitchen GARLIC PO Take by mouth daily.  Marland Kitchen levothyroxine (SYNTHROID, LEVOTHROID) 25 MCG tablet Take 25 mcg by mouth daily.     No current facility-administered medications on file prior to visit.    Review of Systems Per HPI unless specifically indicated above    Objective:    BP 148/100  Pulse 100  Temp(Src) 97.6 F (36.4 C) (Tympanic)  Wt 200 lb (90.719 kg)  SpO2 98%  Physical Exam  Nursing note and  vitals reviewed. Constitutional: He appears well-developed and well-nourished. No distress.  HENT:  Mouth/Throat: Oropharynx is clear and moist. No oropharyngeal exudate.  Eyes: Conjunctivae and EOM are normal. Pupils are equal, round, and reactive to light. No scleral icterus.  Cardiovascular: Normal rate, regular rhythm, normal heart sounds and intact distal pulses.   No murmur heard. Pulmonary/Chest: Effort normal and breath sounds normal. No respiratory distress. He has no wheezes. He has no rales.  Musculoskeletal: He exhibits no edema.  Swelling noted at R 3rd MCP but no tenderness to palpation.  ++tender with flexion/extension of finger at MCP.  No erythema or warmth of joint. Hard nontender swelling noted at base of 1st CMC consistent with ganglion cyst  Skin: Skin is warm and dry. No rash noted.   No results found for this or any previous visit.    Assessment & Plan:   Problem List Items Addressed This Visit   THROAT CANCER     States he's due for f/u with throat cancer doctors.  Continue levothyroxine per them.    Relevant Medications      meloxicam (MOBIC) tablet   ESSENTIAL HYPERTENSION     Chronic, stable. Elevated recently because he ran out of bp meds.  refilled x 1 year today. Advised he return for physcial in next 1-2 months and will check  baseline labs at that time. Prior had declined labs/EKG etc due to financial concerns. Endorses good control when on bp meds.    Relevant Medications      amLODIpine (NORVASC) tablet      metoprolol (LOPRESSOR) tablet   Right hand pain - Primary     Swelling at 3rd MCP noted - xray today without significant erosive arthritis but + joint space narrowing.  ?gout vs pseudogout. Will treat with meloxicam course. Consider uric acid check at physical blood work. Consider colchicine  Xray returned - likely CPPD - would anticipate chronic pseudogout.    Relevant Orders      DG Hand Complete Right (Completed)       Follow up  plan: Return as needed, for physical.

## 2013-10-31 NOTE — Assessment & Plan Note (Signed)
States he's due for f/u with throat cancer doctors.  Continue levothyroxine per them.

## 2013-10-31 NOTE — Assessment & Plan Note (Signed)
Chronic, stable. Elevated recently because he ran out of bp meds.  refilled x 1 year today. Advised he return for physcial in next 1-2 months and will check baseline labs at that time. Prior had declined labs/EKG etc due to financial concerns. Endorses good control when on bp meds.

## 2013-10-31 NOTE — Assessment & Plan Note (Addendum)
Swelling at 3rd MCP noted - xray today without significant erosive arthritis but + joint space narrowing.  ?gout vs pseudogout. Will treat with meloxicam course. Consider uric acid check at physical blood work. Consider colchicine  Xray returned - likely CPPD - would anticipate chronic pseudogout.

## 2013-10-31 NOTE — Patient Instructions (Addendum)
Return in next 1-2 months for physical. Xray of right hand today.  I'm suspicious for gout or gout-like picture. May try meloxicam 15mg  daily for next 3-5 days then as needed (take with food).   We may get blood work at your physical. I've refilled medicines today as well. Good to see you today, call us with questions.  Gout Gout is an inflammatory arthritis caused by a buildup of uric acid crystals in the joints. Uric acid is a chemical that is normally present in the blood. When the level of uric acid in the blood is too high it can form crystals that deposit in your joints and tissues. This causes joint redness, soreness, and swelling (inflammation). Repeat attacks are common. Over time, uric acid crystals can form into masses (tophi) near a joint, destroying bone and causing disfigurement. Gout is treatable and often preventable. CAUSES  The disease begins with elevated levels of uric acid in the blood. Uric acid is produced by your body when it breaks down a naturally found substance called purines. Certain foods you eat, such as meats and fish, contain high amounts of purines. Causes of an elevated uric acid level include:  Being passed down from parent to child (heredity).  Diseases that cause increased uric acid production (such as obesity, psoriasis, and certain cancers).  Excessive alcohol use.  Diet, especially diets rich in meat and seafood.  Medicines, including certain cancer-fighting medicines (chemotherapy), water pills (diuretics), and aspirin.  Chronic kidney disease. The kidneys are no longer able to remove uric acid well.  Problems with metabolism. Conditions strongly associated with gout include:  Obesity.  High blood pressure.  High cholesterol.  Diabetes. Not everyone with elevated uric acid levels gets gout. It is not understood why some people get gout and others do not. Surgery, joint injury, and eating too much of certain foods are some of the factors that  can lead to gout attacks. SYMPTOMS   An attack of gout comes on quickly. It causes intense pain with redness, swelling, and warmth in a joint.  Fever can occur.  Often, only one joint is involved. Certain joints are more commonly involved:  Base of the big toe.  Knee.  Ankle.  Wrist.  Finger. Without treatment, an attack usually goes away in a few days to weeks. Between attacks, you usually will not have symptoms, which is different from many other forms of arthritis. DIAGNOSIS  Your caregiver will suspect gout based on your symptoms and exam. In some cases, tests may be recommended. The tests may include:  Blood tests.  Urine tests.  X-rays.  Joint fluid exam. This exam requires a needle to remove fluid from the joint (arthrocentesis). Using a microscope, gout is confirmed when uric acid crystals are seen in the joint fluid. TREATMENT  There are two phases to gout treatment: treating the sudden onset (acute) attack and preventing attacks (prophylaxis).  Treatment of an Acute Attack.  Medicines are used. These include anti-inflammatory medicines or steroid medicines.  An injection of steroid medicine into the affected joint is sometimes necessary.  The painful joint is rested. Movement can worsen the arthritis.  You may use warm or cold treatments on painful joints, depending which works best for you.  Treatment to Prevent Attacks.  If you suffer from frequent gout attacks, your caregiver may advise preventive medicine. These medicines are started after the acute attack subsides. These medicines either help your kidneys eliminate uric acid from your body or decrease your uric acid  production. You may need to stay on these medicines for a very long time.  The early phase of treatment with preventive medicine can be associated with an increase in acute gout attacks. For this reason, during the first few months of treatment, your caregiver may also advise you to take  medicines usually used for acute gout treatment. Be sure you understand your caregiver's directions. Your caregiver may make several adjustments to your medicine dose before these medicines are effective.  Discuss dietary treatment with your caregiver or dietitian. Alcohol and drinks high in sugar and fructose and foods such as meat, poultry, and seafood can increase uric acid levels. Your caregiver or dietician can advise you on drinks and foods that should be limited. HOME CARE INSTRUCTIONS   Do not take aspirin to relieve pain. This raises uric acid levels.  Only take over-the-counter or prescription medicines for pain, discomfort, or fever as directed by your caregiver.  Rest the joint as much as possible. When in bed, keep sheets and blankets off painful areas.  Keep the affected joint raised (elevated).  Apply warm or cold treatments to painful joints. Use of warm or cold treatments depends on which works best for you.  Use crutches if the painful joint is in your leg.  Drink enough fluids to keep your urine clear or pale yellow. This helps your body get rid of uric acid. Limit alcohol, sugary drinks, and fructose drinks.  Follow your dietary instructions. Pay careful attention to the amount of protein you eat. Your daily diet should emphasize fruits, vegetables, whole grains, and fat-free or low-fat milk products. Discuss the use of coffee, vitamin C, and cherries with your caregiver or dietician. These may be helpful in lowering uric acid levels.  Maintain a healthy body weight. SEEK MEDICAL CARE IF:   You develop diarrhea, vomiting, or any side effects from medicines.  You do not feel better in 24 hours, or you are getting worse. SEEK IMMEDIATE MEDICAL CARE IF:   Your joint becomes suddenly more tender, and you have chills or a fever. MAKE SURE YOU:   Understand these instructions.  Will watch your condition.  Will get help right away if you are not doing well or get  worse. Document Released: 07/17/2000 Document Revised: 11/14/2012 Document Reviewed: 03/02/2012 [facility] Patient Information 2014 [place].

## 2013-11-02 ENCOUNTER — Encounter: Payer: Self-pay | Admitting: *Deleted

## 2013-12-22 ENCOUNTER — Other Ambulatory Visit: Payer: Self-pay | Admitting: Family Medicine

## 2013-12-22 ENCOUNTER — Encounter: Payer: Self-pay | Admitting: Family Medicine

## 2013-12-22 ENCOUNTER — Ambulatory Visit (INDEPENDENT_AMBULATORY_CARE_PROVIDER_SITE_OTHER): Payer: BC Managed Care – PPO | Admitting: Family Medicine

## 2013-12-22 VITALS — BP 138/84 | HR 80 | Temp 98.1°F | Wt 200.0 lb

## 2013-12-22 DIAGNOSIS — M11249 Other chondrocalcinosis, unspecified hand: Secondary | ICD-10-CM

## 2013-12-22 DIAGNOSIS — B009 Herpesviral infection, unspecified: Secondary | ICD-10-CM

## 2013-12-22 MED ORDER — COLCHICINE 0.6 MG PO TABS: 0.6000 mg | ORAL_TABLET | Freq: Two times a day (BID) | ORAL | Status: DC | PRN

## 2013-12-22 MED ORDER — VALACYCLOVIR HCL 500 MG PO TABS: 500.0000 mg | ORAL_TABLET | Freq: Every day | ORAL | Status: DC

## 2013-12-22 NOTE — Assessment & Plan Note (Signed)
Xray consistent with CPPD - did not respond to meloxicam. Treat with colchicine - 0.6 mg bid prn. If not improved, consider referral to rheum. Pt agrees with plan.

## 2013-12-22 NOTE — Telephone Encounter (Signed)
Pt left message with Vilma Meckel on status of amitriptyline. Advised pt [facility] [address] had requested refill.

## 2013-12-22 NOTE — Progress Notes (Signed)
Pre visit review using our clinic review tool, if applicable. No additional management support is needed unless otherwise documented below in the visit note. 

## 2013-12-22 NOTE — Telephone Encounter (Signed)
Ok to refill 

## 2013-12-22 NOTE — Assessment & Plan Note (Signed)
Treat with valtrex 500mg  bid x 3 days then 500mg  daily ppx.

## 2013-12-22 NOTE — Patient Instructions (Addendum)
For hand - start colchicine 1 tablet twice daily for 3 days then back down to once daily and then as needed. Call me with an update in 2 weeks. For herpes - take valtrex 500mg  twice daily for 3 days then back down to once daily preventatively.  Pseudogout Pseudogout is similar to gout. It is an arthritis that causes pain, swelling, and inflammation in a joint. This is due to the presence of calcium pyrophosphate crystals in the joint fluid. This is a different type of crystal than the crystals that cause gout. The joint pain can be severe and may last for days. In some cases, it may last much longer and can mimic rheumatoid arthritis or osteoarthritis. CAUSES  The exact cause of the disease is not known. It develops when crystals of calcium pyrophosphate build up in a joint. These crystals cause inflammation that leads to pain and swelling of the joint.  Chances of developing pseudogout increase with age. It often follows a minor injury.  The condition may be passed down from parent to child (hereditary).  Events such as strokes, heart attacks, or surgery may increase the risk of pseudogout.  Pseudogout can be associated with other disorders (hemophilia, ochronosis, amyloidosis, or hormonal disorders).  Pseudogout can be associated with dehydration, especially following surgery or hospitalization. Patients with known pseudogout should stay well hydrated before and after surgery. SYMPTOMS   Intense, constant pain in one joint that seems to come on for no reason.  The joint area may be hot to the touch, red, swollen, and stiff.  Pain may last from several days to a few weeks. It may then disappear. Later, it may start again, possibly in a different joint.  Pseudogout usually affects the knees. It can also affect the wrists, elbows, shoulders, and ankles. DIAGNOSIS  The diagnosis is often suggested by your exam or is suspected when an abnormal buildup of calcium salts (calcifications) are  seen in the cartilage of joints on X-rays. The final diagnosis is made when fluid from the joint is examined under a special microscope used to find calcium pyrophosphate crystals. The crystals of pseudogout and gout may both be present at the same time. TREATMENT  Nonsteroidal anti-inflammatory drugs (NSAIDs), such as naproxen, treat the pain. Identifying the trigger of pseudogout and treating the underlying cause, such as dehydration, is also important. There is no way to remove the crystals themselves. HOME CARE INSTRUCTIONS   Put ice on the sore joint.  Put ice in a plastic bag.  Place a towel between your skin and the bag.  Leave the ice on for 15-20 minutes at a time, 03-04 times a day, for the first 2 days.  Keep your affected joints raised (elevated) when possible to lessen swelling.  Use crutches (non-weight bearing) as needed. Walk without crutches as the pain allows, or as instructed. Gradually, start bearing weight.  Only take over-the-counter or prescription medicines for pain, discomfort, or fever as directed by your caregiver.  Once you recover from the painful attack, exercise regularly to keep your muscle strength. Not using a sore joint will cause the muscles around it to become weak. This may increase pain. Low-impact exercises, such as swimming, bicycling, water aerobics, and walking, may be best. This will give you energy, strengthen your heart, help you control your weight, and improve your well-being.  Maintain a healthy weight so your joints do not need to bear more weight than necessary. SEEK MEDICAL CARE IF:   You have an increase  in joint pain not relieved with medicine.  You have a fever.  You have more serious symptoms such as skin rash, diarrhea, vomiting, headache, or other joint pains. FOR MORE INFORMATION  Arthritis Foundation: www.arthritis.[facility] of Arth[facility] and Musculoskeletal and Skin Diseases: www.niams.[employer] Document Released:  04/11/2004 Document Revised: 10/12/2011 Document Reviewed: 11/01/2009 [facility] Patient Information 2014 [place], [place].

## 2013-12-22 NOTE — Progress Notes (Signed)
BP 138/84  Pulse 80  Temp(Src) 98.1 F (36.7 C) (Oral)  Wt 200 lb (90.719 kg)   CC: f/u pseudogout  Subjective:    Patient ID: Patrick Bailey, male    DOB: 1961-08-03, 53 y.o.   MRN: [id number]  HPI: Patrick Bailey is a 53 y.o. male presenting on 12/22/2013 for Follow-up   R hand pain - ongoing over last 10 months. Swelling at 3rd MCP, with stiffness, limits ability to clench fist. meloxicam caused trouble with sleep.  Xray returned consistent with pseudogout.  Also with herpes outbreaks that have increased in frequency. Always around the mouth. Wonders if stress related - trouble with ex wife. Prior on daily valtrex which helped. Requests refill.  RIGHT HAND - COMPLETE 3+ VIEW  COMPARISON: None.  FINDINGS:  No acute bony or joint abnormality is identified. Deformity of the  proximal fifth metacarpal is most consistent with old healed  fracture. There is also a remote healed fracture of the diaphysis of  the fourth metacarpal. There is joint space narrowing at the second  and third MCP joints with small osteophytes identified. A small  amount of chondrocalcinosis is seen in the triangular  fibrocartilage. No bony erosion is seen. Ulnar minus variance is  noted.  IMPRESSION:  Second and third MCP joint space narrowing and TFC chondrocalcinosis  are most compatible with CPPD arthropathy.  Remote healed fifth metacarpal fracture.  Ulnar minus variance.  Electronically Signed  By: Inge Rise M.D.  On: 10/31/2013 17:46   Past Medical History  Diagnosis Date  . HTN (hypertension)   . Insomnia   . Throat cancer     Dr. Caryl Pina [facility])  . History of smoking   . Alcohol use   . CPDD (calcium pyrophosphate deposition disease) 10/2013    R hand xray    Past Surgical History  Procedure Laterality Date  . Cancer removal  2010    [facility]man (oral surgeon)  . Pyogenic granuloma  1/22012    [name]    Relevant past medical, surgical, family and social history  reviewed and updated as indicated.  Allergies and medications reviewed and updated. Current Outpatient Prescriptions on File Prior to Visit  Medication Sig  . amitriptyline (ELAVIL) 50 MG tablet TAKE ONE TABLET BY MOUTH AT BEDTIME  . amLODipine (NORVASC) 10 MG tablet TAKE ONE TABLET BY MOUTH EVERY DAY  . aspirin 325 MG tablet Take 325 mg by mouth daily.  . fish oil-omega-3 fatty acids 1000 MG capsule Take 1 g by mouth daily.    Marland Kitchen GARLIC PO Take by mouth daily.  Marland Kitchen levothyroxine (SYNTHROID, LEVOTHROID) 25 MCG tablet Take 25 mcg by mouth daily.    . metoprolol (LOPRESSOR) 100 MG tablet TAKE ONE TABLET BY MOUTH TWICE DAILY   No current facility-administered medications on file prior to visit.    Review of Systems Per HPI unless specifically indicated above    Objective:    BP 138/84  Pulse 80  Temp(Src) 98.1 F (36.7 C) (Oral)  Wt 200 lb (90.719 kg)  Physical Exam  Nursing note and vitals reviewed. Constitutional: He appears well-developed and well-nourished. No distress.  Musculoskeletal: He exhibits edema.  Evident swelling R 3rd MCP  Skin:  Erythematous blisters under lower lip at superior chin, tender   No results found for this or any previous visit.    Assessment & Plan:   Problem List Items Addressed This Visit   Pseudogout of hand     Xray consistent with  CPPD - did not respond to meloxicam. Treat with colchicine - 0.6 mg bid prn. If not improved, consider referral to rheum. Pt agrees with plan.    Relevant Medications      colchicine tablet 0.6 mg   HSV     Treat with valtrex 500mg  bid x 3 days then 500mg  daily ppx.    Relevant Medications      valACYclovir (VALTREX) tablet    Other Visit Diagnoses   HSV-1 infection    -  Primary    Relevant Medications       valACYclovir (VALTREX) tablet        Follow up plan: Return if symptoms worsen or fail to improve.

## 2014-03-11 ENCOUNTER — Emergency Department: Payer: Self-pay | Admitting: Emergency Medicine

## 2014-03-11 LAB — CBC
HCT: 44 % (ref 40.0–52.0)
HGB: 14.8 g/dL (ref 13.0–18.0)
MCH: 33 pg (ref 26.0–34.0)
MCHC: 33.7 g/dL (ref 32.0–36.0)
MCV: 98 fL (ref 80–100)
PLATELETS: 200 10*3/uL (ref 150–440)
RBC: 4.49 10*6/uL (ref 4.40–5.90)
RDW: 12.2 % (ref 11.5–14.5)
WBC: 4.9 10*3/uL (ref 3.8–10.6)

## 2014-03-11 LAB — BASIC METABOLIC PANEL
ANION GAP: 8 (ref 7–16)
BUN: 15 mg/dL (ref 7–18)
CHLORIDE: 104 mmol/L (ref 98–107)
CREATININE: 1.12 mg/dL (ref 0.60–1.30)
Calcium, Total: 8.6 mg/dL (ref 8.5–10.1)
Co2: 25 mmol/L (ref 21–32)
EGFR (African American): >60
EGFR (Non-African Amer.): >60
Glucose: 132 mg/dL — ABNORMAL HIGH (ref 65–99)
OSMOLALITY: 277 (ref 275–301)
POTASSIUM: 4 mmol/L (ref 3.5–5.1)
SODIUM: 137 mmol/L (ref 136–145)

## 2014-03-11 LAB — TROPONIN I: Troponin-I: <0.02 ng/mL

## 2014-03-11 LAB — PRO B NATRIURETIC PEPTIDE: B-Type Natriuretic Peptide: 93 pg/mL (ref 0–125)

## 2014-03-12 ENCOUNTER — Ambulatory Visit (INDEPENDENT_AMBULATORY_CARE_PROVIDER_SITE_OTHER): Payer: BC Managed Care – PPO | Admitting: Cardiovascular Disease

## 2014-03-12 ENCOUNTER — Encounter: Payer: Self-pay | Admitting: Cardiovascular Disease

## 2014-03-12 VITALS — BP 140/100 | HR 98 | Ht 72.0 in | Wt 198.5 lb

## 2014-03-12 DIAGNOSIS — E785 Hyperlipidemia, unspecified: Secondary | ICD-10-CM

## 2014-03-12 DIAGNOSIS — I10 Essential (primary) hypertension: Secondary | ICD-10-CM

## 2014-03-12 DIAGNOSIS — R079 Chest pain, unspecified: Secondary | ICD-10-CM | POA: Insufficient documentation

## 2014-03-12 DIAGNOSIS — F1010 Alcohol abuse, uncomplicated: Secondary | ICD-10-CM

## 2014-03-12 NOTE — Assessment & Plan Note (Signed)
Recommended alcohol cessation. This could exacerbate his GERD

## 2014-03-12 NOTE — Patient Instructions (Addendum)
You are doing well. No medication changes were made.  For gas pain, Take some tums, Soda for belching, take 2 ranitidine  Monitor your blood pressure at home Call if the numbers ar running high,  bottom number >90  Lab work fasting on Friday morning  Please call us if you have new issues that need to be addressed before your next appt.

## 2014-03-12 NOTE — Assessment & Plan Note (Addendum)
Blood pressure is elevated, though he states he is very anxious about his visit today as he was told that he had a recent heart attack yesterday by the nursing staff in the emergency room. We have tried to go through his data with him and there was no clear sign of heart attack. Troponin was negative.  Recommended he closely monitor his blood pressure at home and call our office if his numbers are running high

## 2014-03-12 NOTE — Assessment & Plan Note (Addendum)
Acute onset severe chest pain yesterday consistent with GI etiology. No further symptoms since that time, workup negative in the emergency room. He did an entire morning of chainsaw with no symptoms. He does report eating a significant amount that night, excessively,  which he does periodically.   He does have risk factors for coronary artery disease including prior smoking history, hyperlipidemia. We have suggested if symptoms recur that he call our office for stress testing. We have suggested for GI type symptoms that he try to keep his meals smaller, take TUMS and Pepcid for acute onset of pain, try carbonation and belching for possible hiatal hernia.   Also recommended he decrease his aspirin down to 81 mg daily

## 2014-03-12 NOTE — Progress Notes (Signed)
Patient ID: Patrick Bailey, male    DOB: 08-30-1960, 53 y.o.   MRN: [id number]  HPI Comments: Patrick Bailey is a pleasant 53 year old gentleman with history of hypertension, throat cancer 5 years ago with radical neck dissection on the right, smoking for 87 years from age [age] years old, hypothyroidism who presents for evaluation of chest pain.  He reports that over the weekend he had acute onset of upper epigastric discomfort around his xiphoid area. It radiated from right to left across his chest,  it started at 3:00 in the morning, 10 over 10 in intensity. It hurt to breathe . Symptoms lasted for 30-40 minutes. He reports that he is unable to belch and has always had difficulty doing so. He did not take any significant medications for his discomfort and went back to bed. Symptoms eventually resolved.  In the morning he felt great, went and used his chainsaw for most of the morning with no symptoms of reproducible chest pain.  Over the course of the day today, he has had no symptoms.  He did go to the emergency room 03/11/2014, yesterday for workup. Hospital records were reviewed. Normal, including negative troponin Normal EKG showing normal sinus rhythm with rate 72 beats per minute, no significant ST or T wave changes. He was discharged home with followup today  EKG shows normal sinus rhythm with rate 98 beats per minute, no significant ST or T wave changes    Outpatient Encounter Prescriptions as of 03/12/2014  Medication Sig  . amitriptyline (ELAVIL) 50 MG tablet TAKE ONE TABLET BY MOUTH AT BEDTIME  . amLODipine (NORVASC) 10 MG tablet TAKE ONE TABLET BY MOUTH EVERY DAY  . aspirin 325 MG tablet Take 325 mg by mouth daily.  . colchicine 0.6 MG tablet Take 1 tablet (0.6 mg total) by mouth 2 (two) times daily as needed.  . fish oil-omega-3 fatty acids 1000 MG capsule Take 1 g by mouth daily.    Marland Kitchen GARLIC PO Take by mouth daily.  Marland Kitchen levothyroxine (SYNTHROID, LEVOTHROID) 25 MCG tablet Take 25 mcg  by mouth daily.    . metoprolol (LOPRESSOR) 100 MG tablet TAKE ONE TABLET BY MOUTH TWICE DAILY  . omeprazole (PRILOSEC) 20 MG capsule Take 20 mg by mouth daily.  . valACYclovir (VALTREX) 500 MG tablet Take 1 tablet (500 mg total) by mouth daily.     Review of Systems  Constitutional: Negative.   HENT: Negative.   Eyes: Negative.   Respiratory: Negative.   Cardiovascular: Positive for chest pain.  Gastrointestinal: Negative.   Endocrine: Negative.   Musculoskeletal: Negative.   Skin: Negative.   Allergic/Immunologic: Negative.   Neurological: Negative.   Hematological: Negative.   Psychiatric/Behavioral: Negative.   All other systems reviewed and are negative.   BP 140/100  Pulse 98  Ht 6' (1.829 m)  Wt 198 lb 8 oz (90.039 kg)  BMI 26.92 kg/m2  Physical Exam  Nursing note and vitals reviewed. Constitutional: He is oriented to person, place, and time. He appears well-developed and well-nourished.  Previous surgical scarring on the right side of his neck  HENT:  Head: Normocephalic.  Nose: Nose normal.  Mouth/Throat: Oropharynx is clear and moist.  Eyes: Conjunctivae are normal. Pupils are equal, round, and reactive to light.  Neck: Normal range of motion. Neck supple. No JVD present.  Cardiovascular: Normal rate, regular rhythm, S1 normal, S2 normal, normal heart sounds and intact distal pulses.  Exam reveals no gallop and no friction rub.   No  murmur heard. Pulmonary/Chest: Effort normal and breath sounds normal. No respiratory distress. He has no wheezes. He has no rales. He exhibits no tenderness.  Abdominal: Soft. Bowel sounds are normal. He exhibits no distension. There is no tenderness.  Musculoskeletal: Normal range of motion. He exhibits no edema and no tenderness.  Lymphadenopathy:    He has no cervical adenopathy.  Neurological: He is alert and oriented to person, place, and time. Coordination normal.  Skin: Skin is warm and dry. No rash noted. No erythema.   Psychiatric: He has a normal mood and affect. His behavior is normal. Judgment and thought content normal.      Assessment and Plan

## 2014-03-16 ENCOUNTER — Ambulatory Visit (INDEPENDENT_AMBULATORY_CARE_PROVIDER_SITE_OTHER): Payer: BC Managed Care – PPO | Admitting: *Deleted

## 2014-03-16 DIAGNOSIS — E785 Hyperlipidemia, unspecified: Secondary | ICD-10-CM

## 2014-03-17 LAB — LIPID PANEL
CHOL/HDL RATIO: 5.8 ratio — AB (ref 0.0–5.0)
Cholesterol, Total: 231 mg/dL — ABNORMAL HIGH (ref 100–199)
HDL: 40 mg/dL (ref >39)
LDL Calculated: 123 mg/dL — ABNORMAL HIGH (ref 0–99)
Triglycerides: 342 mg/dL — ABNORMAL HIGH (ref 0–149)
VLDL CHOLESTEROL CAL: 68 mg/dL — AB (ref 5–40)

## 2014-03-28 ENCOUNTER — Other Ambulatory Visit: Payer: Self-pay

## 2014-03-28 MED ORDER — ATORVASTATIN CALCIUM 10 MG PO TABS: 10.0000 mg | ORAL_TABLET | Freq: Every day | ORAL | Status: DC

## 2014-05-22 ENCOUNTER — Encounter: Payer: Self-pay | Admitting: Family Medicine

## 2014-05-22 ENCOUNTER — Telehealth: Payer: Self-pay

## 2014-05-22 ENCOUNTER — Ambulatory Visit (INDEPENDENT_AMBULATORY_CARE_PROVIDER_SITE_OTHER): Payer: 59 | Admitting: Family Medicine

## 2014-05-22 VITALS — BP 146/92 | HR 103 | Temp 98.1°F | Ht 72.0 in | Wt 202.5 lb

## 2014-05-22 DIAGNOSIS — L089 Local infection of the skin and subcutaneous tissue, unspecified: Secondary | ICD-10-CM

## 2014-05-22 DIAGNOSIS — Z23 Encounter for immunization: Secondary | ICD-10-CM

## 2014-05-22 MED ORDER — AMOXICILLIN-POT CLAVULANATE 875-125 MG PO TABS: 1.0000 | ORAL_TABLET | Freq: Two times a day (BID) | ORAL | Status: DC

## 2014-05-22 NOTE — Progress Notes (Signed)
   Subjective:    Patient ID: Patrick Ochoa., male    DOB: Oct 28, 1960, 53 y.o.   MRN: [id number]  HPI Cut his R hand at work last week - on a dirty knife   (works at a [employer])  Not very deep/ did not bleed a lot  Kept working - did not wash it right away   Cut is right below index finger  Also has a scratch on finger from a pc of aluminum   About 3 days later -pain and redness and swelling  No pus from either wound  No trouble moving hand   He tends to scratch his hands up a lot at work -will start wearing gloves    Review of Systems Review of Systems  Constitutional: Negative for fever, appetite change, fatigue and unexpected weight change.  Eyes: Negative for pain and visual disturbance.  Respiratory: Negative for cough and shortness of breath.   Cardiovascular: Negative for cp or palpitations    Gastrointestinal: Negative for nausea, diarrhea and constipation.  Genitourinary: Negative for urgency and frequency.  Skin: Negative for pallor or rash  pos for wounds on R hand Neurological: Negative for weakness, light-headedness, numbness and headaches.  Hematological: Negative for adenopathy. Does not bruise/bleed easily.  Psychiatric/Behavioral: Negative for dysphoric mood. The patient is not nervous/anxious.         Objective:   Physical Exam  Constitutional: He appears well-developed and well-nourished. No distress.  HENT:  Head: Normocephalic and atraumatic.  Eyes: Conjunctivae and EOM are normal. Pupils are equal, round, and reactive to light. No scleral icterus.  Neck: Normal range of motion. Neck supple.  Cardiovascular: Regular rhythm.   Musculoskeletal:  Nl grip/ rom fingers R hand  Nl sensation   Lymphadenopathy:    He has no cervical adenopathy.  Neurological: He is alert. He has normal reflexes. No sensory deficit.  Skin: Skin is warm and dry. No rash noted. There is erythema. No pallor.  R hand: 1 cm superficial laceration on palm just  proximal to MCP with small collar of erythema and swelling but no drainage or fluctuance  Scratches with scant redness on dorsal R index finger   Psychiatric: He has a normal mood and affect.          Assessment & Plan:   Problem List Items Addressed This Visit     Musculoskeletal and Integument   Skin infection - Primary     Pt has superficial wound just proximal to first MCP on palm and 2 scratches on dorsal index finger  All wounds have small collar of redness/swelling with tenderness  No fb seen-pt is certain of this  Tdap today  inst to wash wounds frequently with soap and water  abx ointment or cream otc augmentin- 10 d course Update if not starting to improve in a week or if worsening      Relevant Orders      Tdap vaccine greater than or equal to 7yo IM (Completed)    Other Visit Diagnoses   Need for Tdap vaccination        Relevant Orders       Tdap vaccine greater than or equal to 7yo IM (Completed)

## 2014-05-22 NOTE — Patient Instructions (Signed)
Tetanus shot today  Keep wounds clean with soap and water  Antibiotic cream or ointment over the counter is a good idea as well  A loose dressing like a band aid is ok as well  Take the augmentin as directed- finish it all  If you develop worse redness/swelling or streaking or fever- let us know  Update if not starting to improve in a week or if worsening

## 2014-05-22 NOTE — Progress Notes (Signed)
Pre visit review using our clinic review tool, if applicable. No additional management support is needed unless otherwise documented below in the visit note. 

## 2014-05-22 NOTE — Telephone Encounter (Signed)
Seen in office today  

## 2014-05-22 NOTE — Telephone Encounter (Signed)
Pt walked in ; cut rt hand at work with dirty work knife last week and now cut is red, swollen and hurting; no drainage. Also has cut on rt index finger. Pt request antibiotic. Dr Darnell Level had no available appt but pt will see Dr Glori Bickers this afternoon.

## 2014-05-22 NOTE — Assessment & Plan Note (Signed)
Pt has superficial wound just proximal to first MCP on palm and 2 scratches on dorsal index finger  All wounds have small collar of redness/swelling with tenderness  No fb seen-pt is certain of this  Tdap today  inst to wash wounds frequently with soap and water  abx ointment or cream otc augmentin- 10 d course Update if not starting to improve in a week or if worsening

## 2014-07-16 ENCOUNTER — Other Ambulatory Visit: Payer: Self-pay | Admitting: Family Medicine

## 2014-07-16 NOTE — Telephone Encounter (Signed)
Pt wanted to ck on status of valacyclovir; pt has HSV outbreak at mouth;pt will ck with pharmacy later tonight or in the AM and if does not get a refill pt request cb.[facility] [address]. Pt last seen 12/22/13 for HSV and sick visit 05/22/14. No future appt scheduled.

## 2014-08-10 ENCOUNTER — Encounter: Payer: Self-pay | Admitting: Family Medicine

## 2014-08-10 ENCOUNTER — Ambulatory Visit (INDEPENDENT_AMBULATORY_CARE_PROVIDER_SITE_OTHER): Payer: 59 | Admitting: Family Medicine

## 2014-08-10 ENCOUNTER — Ambulatory Visit (INDEPENDENT_AMBULATORY_CARE_PROVIDER_SITE_OTHER)
Admission: RE | Admit: 2014-08-10 | Discharge: 2014-08-10 | Disposition: A | Discharge status: Home / Self Care | Payer: 59 | Source: Ambulatory Visit | Attending: Family Medicine | Admitting: Family Medicine

## 2014-08-10 VITALS — BP 130/88 | HR 104 | Temp 98.1°F | Wt 201.5 lb

## 2014-08-10 DIAGNOSIS — M79602 Pain in left arm: Secondary | ICD-10-CM

## 2014-08-10 DIAGNOSIS — M79605 Pain in left leg: Secondary | ICD-10-CM

## 2014-08-10 DIAGNOSIS — M79606 Pain in leg, unspecified: Secondary | ICD-10-CM | POA: Insufficient documentation

## 2014-08-10 LAB — CBC WITH DIFFERENTIAL/PLATELET
BASOS ABS: 0.1 10*3/uL (ref 0.0–0.1)
BASOS PCT: 1 % (ref 0–1)
Eosinophils Absolute: 0.2 10*3/uL (ref 0.0–0.7)
Eosinophils Relative: 3 % (ref 0–5)
HEMATOCRIT: 40.4 % (ref 39.0–52.0)
HEMOGLOBIN: 14.4 g/dL (ref 13.0–17.0)
LYMPHS PCT: 34 % (ref 12–46)
Lymphs Abs: 2.1 10*3/uL (ref 0.7–4.0)
MCH: 33.3 pg (ref 26.0–34.0)
MCHC: 35.6 g/dL (ref 30.0–36.0)
MCV: 93.5 fL (ref 78.0–100.0)
MPV: 10.1 fL (ref 8.6–12.4)
Monocytes Absolute: 0.6 10*3/uL (ref 0.1–1.0)
Monocytes Relative: 10 % (ref 3–12)
NEUTROS PCT: 52 % (ref 43–77)
Neutro Abs: 3.2 10*3/uL (ref 1.7–7.7)
Platelets: 209 10*3/uL (ref 150–400)
RBC: 4.32 MIL/uL (ref 4.22–5.81)
RDW: 13 % (ref 11.5–15.5)
WBC: 6.2 10*3/uL (ref 4.0–10.5)

## 2014-08-10 MED ORDER — LEVOTHYROXINE SODIUM 150 MCG PO TABS: 150.0000 ug | ORAL_TABLET | Freq: Every day | ORAL | Status: DC

## 2014-08-10 MED ORDER — DIAZEPAM 5 MG PO TABS: 5.0000 mg | ORAL_TABLET | Freq: Every evening | ORAL | Status: DC | PRN

## 2014-08-10 NOTE — Addendum Note (Signed)
Addended by: Marchia Bond on: 08/10/2014 04:36 PM   Modules accepted: Orders

## 2014-08-10 NOTE — Progress Notes (Signed)
Pre visit review using our clinic review tool, if applicable. No additional management support is needed unless otherwise documented below in the visit note. 

## 2014-08-10 NOTE — Progress Notes (Addendum)
BP 130/88 mmHg  Pulse 104  Temp(Src) 98.1 F (36.7 C) (Oral)  Wt 201 lb 8 oz (91.4 kg)   CC: left leg pain  Subjective:    Patient ID: Patrick Ochoa., male    DOB: 04-24-1961, 54 y.o.   MRN: [id number]  HPI: Patrick Bailey. is a 54 y.o. male presenting on 08/10/2014 for Leg Pain and Medication Refill   L leg pain ongoing over last month. Pain described as deep bone ache/pain. No sharp stabbing, no paresthesias or numbness, no burning, not cramping or restless sensation. Improved with leg movement. Improved with father's valium. Points to anterior leg, no pain in foot. Worse with laying down and laying on left side. Denies inciting trauma or injury. Climbs at work.  No pain during the day.  Denies fevers/chills, back pain, radiculopathy, buttock pain, lateral hip pain, bowel or bladder accidents  H/o CPDD by xray.  H/o throat cancer on levothyroxine 143mcg daily - has been unable to reach [facility] for med refill. I will refill 1 mo of this. States he has been out of med for last 1 week.  Relevant past medical, surgical, family and social history reviewed and updated as indicated. Interim medical history since our last visit reviewed. Allergies and medications reviewed and updated. Current Outpatient Prescriptions on File Prior to Visit  Medication Sig  . amitriptyline (ELAVIL) 50 MG tablet TAKE ONE TABLET BY MOUTH AT BEDTIME  . amLODipine (NORVASC) 10 MG tablet TAKE ONE TABLET BY MOUTH EVERY DAY  . aspirin 325 MG tablet Take 325 mg by mouth daily.  Marland Kitchen atorvastatin (LIPITOR) 10 MG tablet Take 1 tablet (10 mg total) by mouth daily.  . colchicine 0.6 MG tablet Take 1 tablet (0.6 mg total) by mouth 2 (two) times daily as needed.  . fish oil-omega-3 fatty acids 1000 MG capsule Take 1 g by mouth daily.    Marland Kitchen GARLIC PO Take by mouth daily.  . metoprolol (LOPRESSOR) 100 MG tablet TAKE ONE TABLET BY MOUTH TWICE DAILY  . omeprazole (PRILOSEC) 20 MG capsule Take 20 mg by mouth daily.  .  valACYclovir (VALTREX) 500 MG tablet TAKE ONE TABLET BY MOUTH ONCE DAILY   No current facility-administered medications on file prior to visit.    Review of Systems Per HPI unless specifically indicated above     Objective:    BP 130/88 mmHg  Pulse 104  Temp(Src) 98.1 F (36.7 C) (Oral)  Wt 201 lb 8 oz (91.4 kg)  Wt Readings from Last 3 Encounters:  08/10/14 201 lb 8 oz (91.4 kg)  05/22/14 202 lb 8 oz (91.853 kg)  03/12/14 198 lb 8 oz (90.039 kg)    Physical Exam  Constitutional: He appears well-developed and well-nourished. No distress.  Musculoskeletal: He exhibits no edema.  No pain midline spine No paraspinous mm tenderness Neg SLR bilaterally. No pain with int/ext rotation at hip. Neg FABER. No pain at SIJ, GTB or sciatic notch bilaterally.  Right leg WNL Left leg WNL no tenderness to palpation, no erythema or warmth. 2+ DP bilaterally bilat calf circ 39cm  Marked crepitus with extension at knee  Nursing note and vitals reviewed.      Assessment & Plan:   Problem List Items Addressed This Visit    Leg pain, anterior - Primary    Unclear etiology - not consistent with DVT or claudication or venous insufficiency. Good pulses. Check xray femur and tibia given cancer history. Check labs - TSH, BMP, CBC  and CPK. Update if not improved with tylenol qhs prn. Will also provide with short valium course which seems to help patient , discussed temporary course, discussed dependence, tolerance, and addiction potential.    Relevant Orders      DG Tibia/Fibula Left      DG FEMUR MIN 2 VIEWS LEFT      CBC with Differential      Basic metabolic panel      CK       Follow up plan: Return if symptoms worsen or fail to improve.

## 2014-08-10 NOTE — Addendum Note (Signed)
Addended by: Ellamae Sia on: 08/10/2014 04:56 PM   Modules accepted: Orders

## 2014-08-10 NOTE — Assessment & Plan Note (Addendum)
Unclear etiology - not consistent with DVT or claudication or venous insufficiency. Good pulses. Check xray femur and tibia given cancer history. Check labs - TSH, BMP, CBC and CPK. Update if not improved with tylenol qhs prn. Will also provide with short valium course which seems to help patient , discussed temporary course, discussed dependence, tolerance, and addiction potential.

## 2014-08-10 NOTE — Addendum Note (Signed)
Addended by: Ria Bush on: 08/10/2014 04:57 PM   Modules accepted: Orders

## 2014-08-10 NOTE — Patient Instructions (Addendum)
Xray today. Keep leg elevated labwork today to check thyroid and electrolytes and muscle function. May use tylenol 500-1000mg  nightly to see if this will help sleep preventatively Let us know if persistent pain despite tylenol.

## 2014-08-11 LAB — CK: Total CK: 67 U/L (ref 7–232)

## 2014-08-11 LAB — BASIC METABOLIC PANEL
BUN: 16 mg/dL (ref 6–23)
CHLORIDE: 100 meq/L (ref 96–112)
CO2: 26 mEq/L (ref 19–32)
CREATININE: 1.02 mg/dL (ref 0.50–1.35)
Calcium: 9.3 mg/dL (ref 8.4–10.5)
Glucose, Bld: 92 mg/dL (ref 70–99)
Potassium: 4.2 mEq/L (ref 3.5–5.3)
Sodium: 136 mEq/L (ref 135–145)

## 2014-08-13 ENCOUNTER — Encounter: Payer: Self-pay | Admitting: *Deleted

## 2014-08-24 ENCOUNTER — Other Ambulatory Visit: Payer: Self-pay | Admitting: Family Medicine

## 2014-08-29 ENCOUNTER — Other Ambulatory Visit: Payer: Self-pay | Admitting: Family Medicine

## 2014-10-19 ENCOUNTER — Ambulatory Visit (INDEPENDENT_AMBULATORY_CARE_PROVIDER_SITE_OTHER): Payer: 59 | Admitting: Family Medicine

## 2014-10-19 ENCOUNTER — Ambulatory Visit (INDEPENDENT_AMBULATORY_CARE_PROVIDER_SITE_OTHER)
Admission: RE | Admit: 2014-10-19 | Discharge: 2014-10-19 | Disposition: A | Discharge status: Home / Self Care | Payer: 59 | Source: Ambulatory Visit | Attending: Family Medicine | Admitting: Family Medicine

## 2014-10-19 ENCOUNTER — Encounter: Payer: Self-pay | Admitting: Family Medicine

## 2014-10-19 VITALS — BP 140/90 | HR 96 | Temp 98.1°F | Wt 207.0 lb

## 2014-10-19 DIAGNOSIS — M170 Bilateral primary osteoarthritis of knee: Secondary | ICD-10-CM | POA: Insufficient documentation

## 2014-10-19 DIAGNOSIS — S8990XA Unspecified injury of unspecified lower leg, initial encounter: Secondary | ICD-10-CM | POA: Insufficient documentation

## 2014-10-19 DIAGNOSIS — S8992XA Unspecified injury of left lower leg, initial encounter: Secondary | ICD-10-CM

## 2014-10-19 DIAGNOSIS — M1712 Unilateral primary osteoarthritis, left knee: Secondary | ICD-10-CM

## 2014-10-19 DIAGNOSIS — M1120 Other chondrocalcinosis, unspecified site: Secondary | ICD-10-CM | POA: Insufficient documentation

## 2014-10-19 DIAGNOSIS — M1180 Other specified crystal arthropathies, unspecified site: Secondary | ICD-10-CM

## 2014-10-19 MED ORDER — NAPROXEN 500 MG PO TABS: ORAL_TABLET | ORAL | Status: DC

## 2014-10-19 NOTE — Patient Instructions (Addendum)
I think you had bad bony bruise after fall. Start naprosyn twice daily with food for 5 days then as needed. Ice knee, elevate and rest. Use neoprene knee sleeve you can buy at local pharmacy - for extra support.
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
## 2014-10-19 NOTE — Assessment & Plan Note (Addendum)
By hand xray in the past, and knee xray today. Start NSAID.

## 2014-10-19 NOTE — Assessment & Plan Note (Signed)
H/o this by xray in the past.

## 2014-10-19 NOTE — Progress Notes (Signed)
Pre visit review using our clinic review tool, if applicable. No additional management support is needed unless otherwise documented below in the visit note. 
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
## 2014-10-20 ENCOUNTER — Encounter: Payer: Self-pay | Admitting: Family Medicine

## 2014-10-20 NOTE — Addendum Note (Signed)
Addended by: Ria Bush on: 10/20/2014 10:42 AM   Modules accepted: Miquel Dunn

## 2014-10-25 ENCOUNTER — Other Ambulatory Visit: Payer: Self-pay | Admitting: Family Medicine

## 2014-11-05 ENCOUNTER — Other Ambulatory Visit: Payer: Self-pay | Admitting: Family Medicine

## 2014-12-23 ENCOUNTER — Other Ambulatory Visit: Payer: Self-pay | Admitting: Family Medicine

## 2014-12-24 NOTE — Telephone Encounter (Signed)
Ok to refill 

## 2015-02-12 ENCOUNTER — Other Ambulatory Visit: Payer: Self-pay | Admitting: Cardiovascular Disease

## 2015-02-23 ENCOUNTER — Other Ambulatory Visit: Payer: Self-pay | Admitting: Family Medicine

## 2015-03-04 ENCOUNTER — Encounter: Payer: Self-pay | Admitting: Family Medicine

## 2015-03-04 DIAGNOSIS — E039 Hypothyroidism, unspecified: Secondary | ICD-10-CM | POA: Insufficient documentation

## 2015-03-15 ENCOUNTER — Other Ambulatory Visit: Payer: Self-pay | Admitting: Family Medicine

## 2015-04-01 ENCOUNTER — Ambulatory Visit (INDEPENDENT_AMBULATORY_CARE_PROVIDER_SITE_OTHER): Payer: 59 | Admitting: Primary Care

## 2015-04-01 ENCOUNTER — Ambulatory Visit (INDEPENDENT_AMBULATORY_CARE_PROVIDER_SITE_OTHER)
Admission: RE | Admit: 2015-04-01 | Discharge: 2015-04-01 | Disposition: A | Discharge status: Home / Self Care | Payer: 59 | Source: Ambulatory Visit | Attending: Primary Care | Admitting: Primary Care

## 2015-04-01 ENCOUNTER — Encounter: Payer: Self-pay | Admitting: Primary Care

## 2015-04-01 VITALS — BP 126/88 | HR 77 | Temp 97.4°F | Ht 72.0 in | Wt 199.0 lb

## 2015-04-01 DIAGNOSIS — R05 Cough: Secondary | ICD-10-CM

## 2015-04-01 DIAGNOSIS — R059 Cough, unspecified: Secondary | ICD-10-CM

## 2015-04-01 MED ORDER — HYDROCODONE-HOMATROPINE 5-1.5 MG/5ML PO SYRP: 5.0000 mL | ORAL_SOLUTION | Freq: Every evening | ORAL | Status: DC | PRN

## 2015-04-01 NOTE — Progress Notes (Signed)
Subjective:    Patient ID: Patrick Bailey., male    DOB: 27-Nov-1960, 54 y.o.   MRN: [id number]  HPI  Mr. Cogbill is a 54 year old male who presents today with a chief complaint of cough. His cough is productive (greenish mucous) and has been present since Friday last week. Also reports chills and chest/ nasal congestion. Denies known fevers, sore throat, nausea, sinus pressure. He's been taking Mucinex-D starting Friday with assistance in coughing up mucous. Former smoker and history of throat cancer.  Review of Systems  Constitutional: Positive for chills and fatigue. Negative for fever.  HENT: Positive for congestion. Negative for ear pain and sore throat.   Respiratory: Positive for cough. Negative for shortness of breath.   Cardiovascular: Negative for chest pain.  Gastrointestinal: Negative for nausea and vomiting.       Past Medical History  Diagnosis Date  . HTN (hypertension)   . Insomnia   . History of smoking   . Alcohol use   . CPDD (calcium pyrophosphate deposition disease) 10/2013    R hand xray, L knee xray  . Throat cancer     Dr. Caryl Pina [facility])  . Hypothyroidism (acquired)     Social History   Social History  . Marital Status: Single    Spouse Name: N/A  . Number of Children: 0  . Years of Education: N/A   Occupational History  . Self employed     Community education officer   Social History Main Topics  . Smoking status: Former Smoker -- 0.50 packs/day for 15 years    Types: Cigarettes    Quit date: 08/03/2008  . Smokeless tobacco: Never Used  . Alcohol Use: 3.6 oz/week    6 Cans of beer per week     Comment: Beer-every other day or so  . Drug Use: No  . Sexual Activity: Not on file   Other Topics Concern  . Not on file   Social History Narrative    Past Surgical History  Procedure Laterality Date  . Oral cancer removal  2010    [facility] (oral surgeon)  . Skin lesion excision  1/22012    Forehead pyogenic granuloma [name]    Family History  Problem  Relation Age of Onset  . Coronary artery disease Father   . Hypertension Father   . Heart disease Father   . Heart attack Father     No Known Allergies  Current Outpatient Prescriptions on File Prior to Visit  Medication Sig Dispense Refill  . amitriptyline (ELAVIL) 50 MG tablet TAKE ONE TABLET BY MOUTH AT BEDTIME 30 tablet 11  . amLODipine (NORVASC) 10 MG tablet TAKE ONE TABLET BY MOUTH ONCE DAILY 30 tablet 2  . aspirin 325 MG tablet Take 325 mg by mouth daily.    Marland Kitchen atorvastatin (LIPITOR) 10 MG tablet TAKE ONE TABLET BY MOUTH ONCE DAILY 30 tablet 3  . colchicine 0.6 MG tablet Take 1 tablet (0.6 mg total) by mouth 2 (two) times daily as needed. 60 tablet 3  . fish oil-omega-3 fatty acids 1000 MG capsule Take 1 g by mouth daily.      Marland Kitchen GARLIC PO Take by mouth daily.    Marland Kitchen levothyroxine (SYNTHROID, LEVOTHROID) 150 MCG tablet Take 1 tablet (150 mcg total) by mouth daily. 30 tablet 0  . metoprolol (LOPRESSOR) 100 MG tablet Take 1 tablet (100 mg total) by mouth 2 (two) times daily. *needs follow-up appt before future refills are given* 60 tablet 0  .  naproxen (NAPROSYN) 500 MG tablet Take one po bid x 1 week then prn pain, take with food 30 tablet 0  . omeprazole (PRILOSEC) 20 MG capsule Take 20 mg by mouth daily.    . valACYclovir (VALTREX) 500 MG tablet TAKE ONE TABLET BY MOUTH ONCE DAILY 30 tablet 0   No current facility-administered medications on file prior to visit.    BP 126/88 mmHg  Pulse 77  Temp(Src) 97.4 F (36.3 C) (Oral)  Ht 6' (1.829 m)  Wt 199 lb (90.266 kg)  BMI 26.98 kg/m2  SpO2 98%    Objective:   Physical Exam  Constitutional: He is oriented to person, place, and time. He appears well-nourished. He appears ill.  HENT:  Right Ear: Tympanic membrane and ear canal normal.  Left Ear: Tympanic membrane and ear canal normal.  Nose: Nose normal.  Mouth/Throat: Oropharynx is clear and moist.  Neck: Neck supple.  Cardiovascular: Normal rate and regular rhythm.     Pulmonary/Chest: He has rhonchi in the right upper field and the left upper field.  Lymphadenopathy:    He has no cervical adenopathy.  Neurological: He is alert and oriented to person, place, and time.  Skin: Skin is warm and dry.  Psychiatric: He has a normal mood and affect.          Assessment & Plan:  URI:  Cough x 4 days, productive with chills and congestion. Suspect viral involvement at this point. Due to history and questionable exam will obtain chest xray to rule out pneumonia or abnormalities. Robitussin for daytime cough, RX for hycodan PRN HS. Xray pending. Push fluids. Follow up PRN. Information provided to patient regarding course of URI.

## 2015-04-01 NOTE — Patient Instructions (Signed)
Take Robitussin DM for daytime cough. This may be purchased over the counter.  You may take the Hycodan at bedtime as needed for cough and sleep.  Complete xray(s) prior to leaving today. I will contact you regarding your results.  Increase your intake of water to help with mucous and hydration.  It was a pleasure meeting you!  Upper Respiratory Infection, Adult An upper respiratory infection (URI) is also sometimes known as the common cold. The upper respiratory tract includes the nose, sinuses, throat, trachea, and bronchi. Bronchi are the airways leading to the lungs. Most people improve within 1 week, but symptoms can last up to 2 weeks. A residual cough may last even longer.  CAUSES Many different viruses can infect the tissues lining the upper respiratory tract. The tissues become irritated and inflamed and often become very moist. Mucus production is also common. A cold is contagious. You can easily spread the virus to others by oral contact. This includes kissing, sharing a glass, coughing, or sneezing. Touching your mouth or nose and then touching a surface, which is then touched by another person, can also spread the virus. SYMPTOMS  Symptoms typically develop 1 to 3 days after you come in contact with a cold virus. Symptoms vary from person to person. They may include:  Runny nose.  Sneezing.  Nasal congestion.  Sinus irritation.  Sore throat.  Loss of voice (laryngitis).  Cough.  Fatigue.  Muscle aches.  Loss of appetite.  Headache.  Low-grade fever. DIAGNOSIS  You might diagnose your own cold based on familiar symptoms, since most people get a cold 2 to 3 times a year. Your caregiver can confirm this based on your exam. Most importantly, your caregiver can check that your symptoms are not due to another disease such as strep throat, sinusitis, pneumonia, asthma, or epiglottitis. Blood tests, throat tests, and X-rays are not necessary to diagnose a common cold,  but they may sometimes be helpful in excluding other more serious diseases. Your caregiver will decide if any further tests are required. RISKS AND COMPLICATIONS  You may be at risk for a more severe case of the common cold if you smoke cigarettes, have chronic heart disease (such as heart failure) or lung disease (such as asthma), or if you have a weakened immune system. The very young and very old are also at risk for more serious infections. Bacterial sinusitis, middle ear infections, and bacterial pneumonia can complicate the common cold. The common cold can worsen asthma and chronic obstructive pulmonary disease (COPD). Sometimes, these complications can require emergency medical care and may be life-threatening. PREVENTION  The best way to protect against getting a cold is to practice good hygiene. Avoid oral or hand contact with people with cold symptoms. Wash your hands often if contact occurs. There is no clear evidence that vitamin C, vitamin E, echinacea, or exercise reduces the chance of developing a cold. However, it is always recommended to get plenty of rest and practice good nutrition. TREATMENT  Treatment is directed at relieving symptoms. There is no cure. Antibiotics are not effective, because the infection is caused by a virus, not by bacteria. Treatment may include:  Increased fluid intake. Sports drinks offer valuable electrolytes, sugars, and fluids.  Breathing heated mist or steam (vaporizer or shower).  Eating chicken soup or other clear broths, and maintaining good nutrition.  Getting plenty of rest.  Using gargles or lozenges for comfort.  Controlling fevers with ibuprofen or acetaminophen as directed by your caregiver.  Increasing usage of your inhaler if you have asthma. Zinc gel and zinc lozenges, taken in the first 24 hours of the common cold, can shorten the duration and lessen the severity of symptoms. Pain medicines may help with fever, muscle aches, and throat  pain. A variety of non-prescription medicines are available to treat congestion and runny nose. Your caregiver can make recommendations and may suggest nasal or lung inhalers for other symptoms.  HOME CARE INSTRUCTIONS   Only take over-the-counter or prescription medicines for pain, discomfort, or fever as directed by your caregiver.  Use a warm mist humidifier or inhale steam from a shower to increase air moisture. This may keep secretions moist and make it easier to breathe.  Drink enough water and fluids to keep your urine clear or pale yellow.  Rest as needed.  Return to work when your temperature has returned to normal or as your caregiver advises. You may need to stay home longer to avoid infecting others. You can also use a face mask and careful hand washing to prevent spread of the virus. SEEK MEDICAL CARE IF:   After the first few days, you feel you are getting worse rather than better.  You need your caregiver's advice about medicines to control symptoms.  You develop chills, worsening shortness of breath, or brown or red sputum. These may be signs of pneumonia.  You develop yellow or brown nasal discharge or pain in the face, especially when you bend forward. These may be signs of sinusitis.  You develop a fever, swollen neck glands, pain with swallowing, or white areas in the back of your throat. These may be signs of strep throat. SEEK IMMEDIATE MEDICAL CARE IF:   You have a fever.  You develop severe or persistent headache, ear pain, sinus pain, or chest pain.  You develop wheezing, a prolonged cough, cough up blood, or have a change in your usual mucus (if you have chronic lung disease).  You develop sore muscles or a stiff neck. Document Released: 01/13/2001 Document Revised: 10/12/2011 Document Reviewed: 10/25/2013 [facility] Patient Information 2015 [place], [place]. This information is not intended to replace advice given to you by your health care provider. Make sure  you discuss any questions you have with your health care provider.

## 2015-04-01 NOTE — Progress Notes (Signed)
Pre visit review using our clinic review tool, if applicable. No additional management support is needed unless otherwise documented below in the visit note. 

## 2015-04-08 ENCOUNTER — Other Ambulatory Visit: Payer: Self-pay | Admitting: Family Medicine

## 2015-05-13 ENCOUNTER — Other Ambulatory Visit: Payer: Self-pay | Admitting: Family Medicine

## 2015-05-15 ENCOUNTER — Other Ambulatory Visit: Payer: Self-pay

## 2015-05-15 MED ORDER — METOPROLOL TARTRATE 100 MG PO TABS: ORAL_TABLET | ORAL | Status: DC

## 2015-05-15 NOTE — Telephone Encounter (Signed)
Pt left v/m requesting refill metoprolol to [facility] [address]. Spoke with pt and scheduled CPX on 06/17/15 at 2 pm. Refill done #60 x 0. Pt voiced understanding.

## 2015-05-21 ENCOUNTER — Other Ambulatory Visit: Payer: Self-pay | Admitting: *Deleted

## 2015-05-21 DIAGNOSIS — R059 Cough, unspecified: Secondary | ICD-10-CM

## 2015-05-21 DIAGNOSIS — R05 Cough: Secondary | ICD-10-CM

## 2015-05-21 MED ORDER — LEVOTHYROXINE SODIUM 150 MCG PO TABS: 150.0000 ug | ORAL_TABLET | Freq: Every day | ORAL | Status: DC

## 2015-05-24 ENCOUNTER — Other Ambulatory Visit: Payer: Self-pay | Admitting: Family Medicine

## 2015-06-16 ENCOUNTER — Other Ambulatory Visit: Payer: Self-pay | Admitting: Family Medicine

## 2015-06-17 ENCOUNTER — Ambulatory Visit (INDEPENDENT_AMBULATORY_CARE_PROVIDER_SITE_OTHER): Payer: 59 | Admitting: Family Medicine

## 2015-06-17 ENCOUNTER — Encounter: Payer: Self-pay | Admitting: Family Medicine

## 2015-06-17 VITALS — BP 158/88 | HR 107 | Temp 98.1°F | Ht 71.25 in | Wt 200.4 lb

## 2015-06-17 DIAGNOSIS — Z1159 Encounter for screening for other viral diseases: Secondary | ICD-10-CM

## 2015-06-17 DIAGNOSIS — Z0000 Encounter for general adult medical examination without abnormal findings: Secondary | ICD-10-CM | POA: Diagnosis not present

## 2015-06-17 DIAGNOSIS — R2231 Localized swelling, mass and lump, right upper limb: Secondary | ICD-10-CM

## 2015-06-17 DIAGNOSIS — E785 Hyperlipidemia, unspecified: Secondary | ICD-10-CM

## 2015-06-17 DIAGNOSIS — Z0001 Encounter for general adult medical examination with abnormal findings: Secondary | ICD-10-CM | POA: Insufficient documentation

## 2015-06-17 DIAGNOSIS — C140 Malignant neoplasm of pharynx, unspecified: Secondary | ICD-10-CM

## 2015-06-17 DIAGNOSIS — E781 Pure hyperglyceridemia: Secondary | ICD-10-CM | POA: Insufficient documentation

## 2015-06-17 DIAGNOSIS — I10 Essential (primary) hypertension: Secondary | ICD-10-CM

## 2015-06-17 DIAGNOSIS — M1120 Other chondrocalcinosis, unspecified site: Secondary | ICD-10-CM

## 2015-06-17 DIAGNOSIS — Z87891 Personal history of nicotine dependence: Secondary | ICD-10-CM

## 2015-06-17 DIAGNOSIS — E039 Hypothyroidism, unspecified: Secondary | ICD-10-CM | POA: Diagnosis not present

## 2015-06-17 DIAGNOSIS — M1180 Other specified crystal arthropathies, unspecified site: Secondary | ICD-10-CM

## 2015-06-17 DIAGNOSIS — Z789 Other specified health status: Secondary | ICD-10-CM

## 2015-06-17 DIAGNOSIS — Z7289 Other problems related to lifestyle: Secondary | ICD-10-CM

## 2015-06-17 MED ORDER — ATORVASTATIN CALCIUM 10 MG PO TABS: 10.0000 mg | ORAL_TABLET | Freq: Every day | ORAL | Status: DC

## 2015-06-17 MED ORDER — AMLODIPINE BESYLATE 10 MG PO TABS: 10.0000 mg | ORAL_TABLET | Freq: Every day | ORAL | Status: DC

## 2015-06-17 MED ORDER — METOPROLOL TARTRATE 100 MG PO TABS: ORAL_TABLET | ORAL | Status: DC

## 2015-06-17 NOTE — Assessment & Plan Note (Signed)
F/u per onc. Appreciate their care of patient.

## 2015-06-17 NOTE — Assessment & Plan Note (Signed)
Chronic, deteriorated. Pt off metoprolol for last 3 days. Refilled today.

## 2015-06-17 NOTE — Patient Instructions (Addendum)
Return next Friday for fasting labs  We will refer you to hand doctor to evaluate swelling of right wrist - ?ganglion cysts. Restart metoprolol for blood pressure. You are doing well today. Return as needed or in 6 months for follow up visit.  Health Maintenance, Male A healthy lifestyle and preventative care can promote health and wellness.  Maintain regular health, dental, and eye exams.  Eat a healthy diet. Foods like vegetables, fruits, whole grains, low-fat dairy products, and lean protein foods contain the nutrients you need and are low in calories. Decrease your intake of foods high in solid fats, added sugars, and salt. Get information about a proper diet from your health care provider, if necessary.  Regular physical exercise is one of the most important things you can do for your health. Most adults should get at least 150 minutes of moderate-intensity exercise (any activity that increases your heart rate and causes you to sweat) each week. In addition, most adults need muscle-strengthening exercises on 2 or more days a week.   Maintain a healthy weight. The body mass index (BMI) is a screening tool to identify possible weight problems. It provides an estimate of body fat based on height and weight. Your health care provider can find your BMI and can help you achieve or maintain a healthy weight. For males 20 years and older:  A BMI below 18.5 is considered underweight.  A BMI of 18.5 to 24.9 is normal.  A BMI of 25 to 29.9 is considered overweight.  A BMI of 30 and above is considered obese.  Maintain normal blood lipids and cholesterol by exercising and minimizing your intake of saturated fat. Eat a balanced diet with plenty of fruits and vegetables. Blood tests for lipids and cholesterol should begin at age 33 and be repeated every 5 years. If your lipid or cholesterol levels are high, you are over age 56, or you are at high risk for heart disease, you may need your cholesterol  levels checked more frequently.Ongoing high lipid and cholesterol levels should be treated with medicines if diet and exercise are not working.  If you smoke, find out from your health care provider how to quit. If you do not use tobacco, do not start.  Lung cancer screening is recommended for adults aged 71-80 years who are at high risk for developing lung cancer because of a history of smoking. A yearly low-dose CT scan of the lungs is recommended for people who have at least a 30-pack-year history of smoking and are current smokers or have quit within the past 15 years. A pack year of smoking is smoking an average of 1 pack of cigarettes a day for 1 year (for example, a 30-pack-year history of smoking could mean smoking 1 pack a day for 30 years or 2 packs a day for 15 years). Yearly screening should continue until the smoker has stopped smoking for at least 15 years. Yearly screening should be stopped for people who develop a health problem that would prevent them from having lung cancer treatment.  If you choose to drink alcohol, do not have more than 2 drinks per day. One drink is considered to be 12 oz (360 mL) of beer, 5 oz (150 mL) of wine, or 1.5 oz (45 mL) of liquor.  Avoid the use of street drugs. Do not share needles with anyone. Ask for help if you need support or instructions about stopping the use of drugs.  High blood pressure causes heart disease  and increases the risk of stroke. High blood pressure is more likely to develop in:  People who have blood pressure in the end of the normal range (100-139/85-89 mm Hg).  People who are overweight or obese.  People who are African American.  If you are 27-30 years of age, have your blood pressure checked every 3-5 years. If you are 6 years of age or older, have your blood pressure checked every year. You should have your blood pressure measured twice--once when you are at a hospital or clinic, and once when you are not at a hospital or  clinic. Record the average of the two measurements. To check your blood pressure when you are not at a hospital or clinic, you can use:  An automated blood pressure machine at a pharmacy.  A home blood pressure monitor.  If you are 49-51 years old, ask your health care provider if you should take aspirin to prevent heart disease.  Diabetes screening involves taking a blood sample to check your fasting blood sugar level. This should be done once every 3 years after age 79 if you are at a normal weight and without risk factors for diabetes. Testing should be considered at a younger age or be carried out more frequently if you are overweight and have at least 1 risk factor for diabetes.  Colorectal cancer can be detected and often prevented. Most routine colorectal cancer screening begins at the age of 36 and continues through age 102. However, your health care provider may recommend screening at an earlier age if you have risk factors for colon cancer. On a yearly basis, your health care provider may provide home test kits to check for hidden blood in the stool. A small camera at the end of a tube may be used to directly examine the colon (sigmoidoscopy or colonoscopy) to detect the earliest forms of colorectal cancer. Talk to your health care provider about this at age 85 when routine screening begins. A direct exam of the colon should be repeated every 5-10 years through age 60, unless early forms of precancerous polyps or small growths are found.  People who are at an increased risk for hepatitis B should be screened for this virus. You are considered at high risk for hepatitis B if:  You were born in a country where hepatitis B occurs often. Talk with your health care provider about which countries are considered high risk.  Your parents were born in a high-risk country and you have not received a shot to protect against hepatitis B (hepatitis B vaccine).  You have HIV or AIDS.  You use needles  to inject street drugs.  You live with, or have sex with, someone who has hepatitis B.  You are a man who has sex with other men (MSM).  You get hemodialysis treatment.  You take certain medicines for conditions like cancer, organ transplantation, and autoimmune conditions.  Hepatitis C blood testing is recommended for all people born from [birth year] through 1966 and any individual with known risk factors for hepatitis C.  Healthy men should no longer receive prostate-specific antigen (PSA) blood tests as part of routine cancer screening. Talk to your health care provider about prostate cancer screening.  Testicular cancer screening is not recommended for adolescents or adult males who have no symptoms. Screening includes self-exam, a health care provider exam, and other screening tests. Consult with your health care provider about any symptoms you have or any concerns you have about testicular cancer.  Practice safe sex. Use condoms and avoid high-risk sexual practices to reduce the spread of sexually transmitted infections (STIs).  You should be screened for STIs, including gonorrhea and chlamydia if:  You are sexually active and are younger than 24 years.  You are older than 24 years, and your health care provider tells you that you are at risk for this type of infection.  Your sexual activity has changed since you were last screened, and you are at an increased risk for chlamydia or gonorrhea. Ask your health care provider if you are at risk.  If you are at risk of being infected with HIV, it is recommended that you take a prescription medicine daily to prevent HIV infection. This is called pre-exposure prophylaxis (PrEP). You are considered at risk if:  You are a man who has sex with other men (MSM).  You are a heterosexual man who is sexually active with multiple partners.  You take drugs by injection.  You are sexually active with a partner who has HIV.  Talk with your health  care provider about whether you are at high risk of being infected with HIV. If you choose to begin PrEP, you should first be tested for HIV. You should then be tested every 3 months for as long as you are taking PrEP.  Use sunscreen. Apply sunscreen liberally and repeatedly throughout the day. You should seek shade when your shadow is shorter than you. Protect yourself by wearing long sleeves, pants, a wide-brimmed hat, and sunglasses year round whenever you are outdoors.  Tell your health care provider of new moles or changes in moles, especially if there is a change in shape or color. Also, tell your health care provider if a mole is larger than the size of a pencil eraser.  A one-time screening for abdominal aortic aneurysm (AAA) and surgical repair of large AAAs by ultrasound is recommended for men aged 35-75 years who are current or former smokers.  Stay current with your vaccines (immunizations).   This information is not intended to replace advice given to you by your health care provider. Make sure you discuss any questions you have with your health care provider.   Document Released: 01/16/2008 Document Revised: 08/10/2014 Document Reviewed: 12/15/2010 Elsevier Interactive Patient Education [employer].

## 2015-06-17 NOTE — Progress Notes (Signed)
Pre visit review using our clinic review tool, if applicable. No additional management support is needed unless otherwise documented below in the visit note. 

## 2015-06-17 NOTE — Assessment & Plan Note (Addendum)
Stable, no recent colchicine use. Persistent R 3rd  MCP swelling.

## 2015-06-17 NOTE — Assessment & Plan Note (Signed)
Remains abstinent 

## 2015-06-17 NOTE — Assessment & Plan Note (Signed)
Preventative protocols reviewed and updated unless pt declined. Discussed healthy diet and lifestyle.  

## 2015-06-17 NOTE — Progress Notes (Signed)
BP 158/88 mmHg  Pulse 107  Temp(Src) 98.1 F (36.7 C) (Oral)  Ht 5' 11.25" (1.81 m)  Wt 200 lb 6.4 oz (90.901 kg)  BMI 27.75 kg/m2  SpO2 97%   CC: CPE  Subjective:    Patient ID: Patrick Ochoa., male    DOB: 09/19/1960, 54 y.o.   MRN: [id number]  HPI: Patrick Kendall. is a 54 y.o. male presenting on 06/17/2015 for Annual Exam   BP elevated today - ran out of metoprolol 184m last Friday.  2 masses on right radial wrist. Not painful but they are growing. Interested in further evaluation.   Preventative: Colon cancer screening - discussed, would like colonoscopy. Prostate cancer screening - discussed, no fmhx prostate cancer, declines.  flu shot - declines Tdap 05/2014 Seat belt use discussed Sunscreen use discussed. No changing moles on skin.  Lives alone - split from wife. 1 dog Occ: Superior mechanical Activity: stays active at work, started weight lifting Diet: good water, some fruits/vegetables   Relevant past medical, surgical, family and social history reviewed and updated as indicated. Interim medical history since our last visit reviewed. Allergies and medications reviewed and updated. Current Outpatient Prescriptions on File Prior to Visit  Medication Sig  . amitriptyline (ELAVIL) 50 MG tablet TAKE ONE TABLET BY MOUTH AT BEDTIME  . aspirin 325 MG tablet Take 325 mg by mouth daily.  . colchicine 0.6 MG tablet Take 1 tablet (0.6 mg total) by mouth 2 (two) times daily as needed.  . fish oil-omega-3 fatty acids 1000 MG capsule Take 1 g by mouth daily.    .Marland KitchenGARLIC PO Take by mouth daily.  .Marland Kitchenlevothyroxine (SYNTHROID, LEVOTHROID) 150 MCG tablet Take 1 tablet (150 mcg total) by mouth daily.  .Marland Kitchenomeprazole (PRILOSEC) 20 MG capsule Take 20 mg by mouth daily.  . valACYclovir (VALTREX) 500 MG tablet TAKE ONE TABLET BY MOUTH ONCE DAILY  . naproxen (NAPROSYN) 500 MG tablet Take one po bid x 1 week then prn pain, take with food (Patient not taking: Reported on  06/17/2015)   No current facility-administered medications on file prior to visit.    Review of Systems  Constitutional: Negative for fever, chills, activity change, appetite change, fatigue and unexpected weight change.  HENT: Negative for hearing loss.   Eyes: Negative for visual disturbance.  Respiratory: Negative for cough, chest tightness, shortness of breath and wheezing.   Cardiovascular: Negative for chest pain, palpitations and leg swelling.  Gastrointestinal: Negative for nausea, vomiting, abdominal pain, diarrhea, constipation, blood in stool and abdominal distention.  Genitourinary: Negative for hematuria and difficulty urinating.  Musculoskeletal: Negative for myalgias, arthralgias and neck pain.  Skin: Negative for rash.  Neurological: Negative for dizziness, seizures, syncope and headaches.  Hematological: Negative for adenopathy. Does not bruise/bleed easily.  Psychiatric/Behavioral: Negative for dysphoric mood. The patient is not nervous/anxious.    Per HPI unless specifically indicated in ROS section     Objective:    BP 158/88 mmHg  Pulse 107  Temp(Src) 98.1 F (36.7 C) (Oral)  Ht 5' 11.25" (1.81 m)  Wt 200 lb 6.4 oz (90.901 kg)  BMI 27.75 kg/m2  SpO2 97%  Wt Readings from Last 3 Encounters:  06/17/15 200 lb 6.4 oz (90.901 kg)  04/01/15 199 lb (90.266 kg)  10/19/14 207 lb (93.895 kg)    Physical Exam  Constitutional: He is oriented to person, place, and time. He appears well-developed and well-nourished. No distress.  HENT:  Head: Normocephalic and atraumatic.  Right Ear: Hearing, tympanic membrane, external ear and ear canal normal.  Left Ear: Hearing, tympanic membrane, external ear and ear canal normal.  Nose: Nose normal.  Mouth/Throat: Uvula is midline, oropharynx is clear and moist and mucous membranes are normal. No oropharyngeal exudate, posterior oropharyngeal edema or posterior oropharyngeal erythema.  Eyes: Conjunctivae and EOM are normal.  Pupils are equal, round, and reactive to light. No scleral icterus.  Neck: Normal range of motion. Neck supple. No thyromegaly present.  Cardiovascular: Normal rate, regular rhythm, normal heart sounds and intact distal pulses.   No murmur heard. Pulses:      Radial pulses are 2+ on the right side, and 2+ on the left side.  Pulmonary/Chest: Effort normal and breath sounds normal. No respiratory distress. He has no wheezes. He has no rales.  Abdominal: Soft. Bowel sounds are normal. He exhibits no distension and no mass. There is no tenderness. There is no rebound and no guarding.  Musculoskeletal: Normal range of motion. He exhibits no edema.  Swelling persistent at R 4d dorsal MCP Cystic masses R wrist dorsal and ventral radial wrist. Nontender, no erythema or fluctuance  Lymphadenopathy:    He has no cervical adenopathy.  Neurological: He is alert and oriented to person, place, and time.  CN grossly intact, station and gait intact  Skin: Skin is warm and dry. No rash noted.  Psychiatric: He has a normal mood and affect. His behavior is normal. Judgment and thought content normal.  Nursing note and vitals reviewed.  Results for orders placed or performed in visit on 08/10/14  CBC with Differential  Result Value Ref Range   WBC 6.2 4.0 - 10.5 K/uL   RBC 4.32 4.22 - 5.81 MIL/uL   Hemoglobin 14.4 13.0 - 17.0 g/dL   HCT 40.4 39.0 - 52.0 %   MCV 93.5 78.0 - 100.0 fL   MCH 33.3 26.0 - 34.0 pg   MCHC 35.6 30.0 - 36.0 g/dL   RDW 13.0 11.5 - 15.5 %   Platelets 209 150 - 400 K/uL   MPV 10.1 8.6 - 12.4 fL   Neutrophils Relative % 52 43 - 77 %   Neutro Abs 3.2 1.7 - 7.7 K/uL   Lymphocytes Relative 34 12 - 46 %   Lymphs Abs 2.1 0.7 - 4.0 K/uL   Monocytes Relative 10 3 - 12 %   Monocytes Absolute 0.6 0.1 - 1.0 K/uL   Eosinophils Relative 3 0 - 5 %   Eosinophils Absolute 0.2 0.0 - 0.7 K/uL   Basophils Relative 1 0 - 1 %   Basophils Absolute 0.1 0.0 - 0.1 K/uL   Smear Review Criteria for  review not met   Basic metabolic panel  Result Value Ref Range   Sodium 136 135 - 145 mEq/L   Potassium 4.2 3.5 - 5.3 mEq/L   Chloride 100 96 - 112 mEq/L   CO2 26 19 - 32 mEq/L   Glucose, Bld 92 70 - 99 mg/dL   BUN 16 6 - 23 mg/dL   Creat 1.02 0.50 - 1.35 mg/dL   Calcium 9.3 8.4 - 10.5 mg/dL  CK  Result Value Ref Range   Total CK 67 7 - 232 U/L      Assessment & Plan:   Problem List Items Addressed This Visit    TOBACCO ABUSE, HX OF    Remains abstinent.      THROAT CANCER    F/u per onc. Appreciate their care of patient.  Mass of right wrist    Anticipate ganglion cysts x2. As enlarging, pt requests hand surgery eval. Referred today.      Relevant Orders   Ambulatory referral to Hand Surgery   Hypothyroidism (acquired)    Chronic, stable on levothyroxine followed by onc after throat cancer removal.       Relevant Medications   metoprolol (LOPRESSOR) 100 MG tablet   Other Relevant Orders   TSH   T4, free   HLD (hyperlipidemia)    Chronic. Continue lipitor, fish oil. Check FLP next fasting labwork.      Relevant Medications   metoprolol (LOPRESSOR) 100 MG tablet   atorvastatin (LIPITOR) 10 MG tablet   amLODipine (NORVASC) 10 MG tablet   Other Relevant Orders   Comprehensive metabolic panel   Lipid panel   Health maintenance examination - Primary    Preventative protocols reviewed and updated unless pt declined. Discussed healthy diet and lifestyle.       Essential hypertension    Chronic, deteriorated. Pt off metoprolol for last 3 days. Refilled today.      Relevant Medications   metoprolol (LOPRESSOR) 100 MG tablet   atorvastatin (LIPITOR) 10 MG tablet   amLODipine (NORVASC) 10 MG tablet   Calcium pyrophosphate crystal disease    Stable, no recent colchicine use. Persistent R 3rd  MCP swelling.      Alcohol use ([place])   Relevant Orders   Comprehensive metabolic panel    Other Visit Diagnoses    Need for hepatitis C screening test         Relevant Orders    Hepatitis C antibody, reflex        Follow up plan: Return in about 6 months (around 12/15/2015), or as needed, for follow up visit.

## 2015-06-17 NOTE — Assessment & Plan Note (Signed)
Anticipate ganglion cysts x2. As enlarging, pt requests hand surgery eval. Referred today.

## 2015-06-17 NOTE — Assessment & Plan Note (Signed)
Chronic. Continue lipitor, fish oil. Check FLP next fasting labwork.

## 2015-06-17 NOTE — Assessment & Plan Note (Signed)
Chronic, stable on levothyroxine followed by onc after throat cancer removal.

## 2015-07-05 ENCOUNTER — Other Ambulatory Visit: Payer: 59

## 2015-07-23 ENCOUNTER — Other Ambulatory Visit: Payer: Self-pay | Admitting: Family Medicine

## 2015-07-30 ENCOUNTER — Other Ambulatory Visit: Payer: Self-pay | Admitting: Family Medicine

## 2015-08-22 ENCOUNTER — Other Ambulatory Visit: Payer: Self-pay | Admitting: Family Medicine

## 2015-09-20 ENCOUNTER — Other Ambulatory Visit: Payer: Self-pay | Admitting: Family Medicine

## 2015-09-20 ENCOUNTER — Encounter: Payer: Self-pay | Admitting: Family Medicine

## 2015-09-20 ENCOUNTER — Ambulatory Visit (INDEPENDENT_AMBULATORY_CARE_PROVIDER_SITE_OTHER): Payer: 59 | Admitting: Family Medicine

## 2015-09-20 VITALS — BP 138/76 | HR 84 | Temp 98.0°F | Wt 207.8 lb

## 2015-09-20 DIAGNOSIS — Z1159 Encounter for screening for other viral diseases: Secondary | ICD-10-CM

## 2015-09-20 DIAGNOSIS — E785 Hyperlipidemia, unspecified: Secondary | ICD-10-CM

## 2015-09-20 DIAGNOSIS — R2231 Localized swelling, mass and lump, right upper limb: Secondary | ICD-10-CM

## 2015-09-20 DIAGNOSIS — E039 Hypothyroidism, unspecified: Secondary | ICD-10-CM | POA: Diagnosis not present

## 2015-09-20 DIAGNOSIS — Z789 Other specified health status: Secondary | ICD-10-CM | POA: Diagnosis not present

## 2015-09-20 DIAGNOSIS — Z7289 Other problems related to lifestyle: Secondary | ICD-10-CM

## 2015-09-20 LAB — LIPID PANEL
Cholesterol: 205 mg/dL — ABNORMAL HIGH (ref 0–200)
HDL: 36.2 mg/dL — AB (ref >39.00)
Total CHOL/HDL Ratio: 6

## 2015-09-20 LAB — COMPREHENSIVE METABOLIC PANEL
ALK PHOS: 66 U/L (ref 39–117)
ALT: 41 U/L (ref 0–53)
AST: 27 U/L (ref 0–37)
Albumin: 4.3 g/dL (ref 3.5–5.2)
BUN: 16 mg/dL (ref 6–23)
CO2: 29 mEq/L (ref 19–32)
CREATININE: 1.12 mg/dL (ref 0.40–1.50)
Calcium: 9.5 mg/dL (ref 8.4–10.5)
Chloride: 101 mEq/L (ref 96–112)
GFR: 72.23 mL/min (ref >60.00)
GLUCOSE: 106 mg/dL — AB (ref 70–99)
Potassium: 4.5 mEq/L (ref 3.5–5.1)
SODIUM: 136 meq/L (ref 135–145)
TOTAL PROTEIN: 7.2 g/dL (ref 6.0–8.3)
Total Bilirubin: 0.4 mg/dL (ref 0.2–1.2)

## 2015-09-20 LAB — T4, FREE: FREE T4: 0.87 ng/dL (ref 0.60–1.60)

## 2015-09-20 LAB — LDL CHOLESTEROL, DIRECT: LDL DIRECT: 68 mg/dL

## 2015-09-20 LAB — TSH: TSH: 1.16 u[IU]/mL (ref 0.35–4.50)

## 2015-09-20 NOTE — Progress Notes (Signed)
Pre visit review using our clinic review tool, if applicable. No additional management support is needed unless otherwise documented below in the visit note. 

## 2015-09-20 NOTE — Progress Notes (Signed)
   BP 138/76 mmHg  Pulse 84  Temp(Src) 98 F (36.7 C) (Oral)  Wt 207 lb 12 oz (94.235 kg)  SpO2 97%   CC: R hand cyst?  Subjective:    Patient ID: Patrick Bailey., male    DOB: 1960/09/04, 55 y.o.   MRN: [id number]  HPI: [name]. is a 55 y.o. male presenting on 09/20/2015 for Cyst   9 month history of mass L wrist, enlarging. Larger after day at work. Using steel cutter all day long.   Due for labs. Several hours since last meal.  Relevant past medical, surgical, family and social history reviewed and updated as indicated. Interim medical history since our last visit reviewed. Allergies and medications reviewed and updated. Current Outpatient Prescriptions on File Prior to Visit  Medication Sig  . amitriptyline (ELAVIL) 50 MG tablet TAKE ONE TABLET BY MOUTH AT BEDTIME  . amLODipine (NORVASC) 10 MG tablet Take 1 tablet (10 mg total) by mouth daily.  Marland Kitchen aspirin 325 MG tablet Take 325 mg by mouth daily.  Marland Kitchen atorvastatin (LIPITOR) 10 MG tablet Take 1 tablet (10 mg total) by mouth daily.  . colchicine 0.6 MG tablet Take 1 tablet (0.6 mg total) by mouth 2 (two) times daily as needed.  . fish oil-omega-3 fatty acids 1000 MG capsule Take 1 g by mouth daily.    Marland Kitchen GARLIC PO Take by mouth daily.  Marland Kitchen levothyroxine (SYNTHROID, LEVOTHROID) 150 MCG tablet TAKE ONE TABLET BY MOUTH ONCE DAILY  . metoprolol (LOPRESSOR) 100 MG tablet TAKE ONE TABLET BY MOUTH TWICE DAILY  . naproxen (NAPROSYN) 500 MG tablet Take one po bid x 1 week then prn pain, take with food  . omeprazole (PRILOSEC) 20 MG capsule Take 20 mg by mouth daily.  . valACYclovir (VALTREX) 500 MG tablet TAKE ONE CAPLET BY MOUTH ONCE DAILY   No current facility-administered medications on file prior to visit.    Review of Systems Per HPI unless specifically indicated in ROS section     Objective:    BP 138/76 mmHg  Pulse 84  Temp(Src) 98 F (36.7 C) (Oral)  Wt 207 lb 12 oz (94.235 kg)  SpO2 97%  Wt Readings from  Last 3 Encounters:  09/20/15 207 lb 12 oz (94.235 kg)  06/17/15 200 lb 6.4 oz (90.901 kg)  04/01/15 199 lb (90.266 kg)    Physical Exam  Constitutional: He appears well-developed and well-nourished. No distress.  Musculoskeletal:  R wrist with 2 cystic masses, nontender or erythematous or warm.  Nursing note and vitals reviewed.      Assessment & Plan:  Due for labs - will draw today. Problem List Items Addressed This Visit    Mass of right wrist - Primary    Anticipate ganglion cyst x2. Referred to hand surgery back 06/2015 but we nor hand surgery office were able to reach patient to schedule appt. I will have him pass by our referral coordinator to schedule appt today.  No charge visit today.          Follow up plan: No Follow-up on file.

## 2015-09-20 NOTE — Assessment & Plan Note (Signed)
Anticipate ganglion cyst x2. Referred to hand surgery back 06/2015 but we nor hand surgery office were able to reach patient to schedule appt. I will have him pass by our referral coordinator to schedule appt today.  No charge visit today.

## 2015-09-20 NOTE — Patient Instructions (Addendum)
Pass by Marion's or Allison's office to schedule appointment.  No charge today. Labs today.

## 2015-09-20 NOTE — Addendum Note (Signed)
Addended by: Royann Shivers A on: 09/20/2015 02:41 PM   Modules accepted: Orders

## 2015-09-21 ENCOUNTER — Other Ambulatory Visit: Payer: Self-pay | Admitting: Family Medicine

## 2015-09-21 DIAGNOSIS — E785 Hyperlipidemia, unspecified: Secondary | ICD-10-CM

## 2015-09-21 LAB — HEPATITIS C ANTIBODY: HCV AB: NEGATIVE

## 2015-10-01 ENCOUNTER — Other Ambulatory Visit: Payer: Self-pay | Admitting: Orthopedic Surgery

## 2015-10-04 ENCOUNTER — Other Ambulatory Visit: Payer: 59

## 2015-10-11 ENCOUNTER — Telehealth: Payer: Self-pay | Admitting: *Deleted

## 2015-10-11 ENCOUNTER — Encounter: Payer: Self-pay | Admitting: Family Medicine

## 2015-10-11 ENCOUNTER — Ambulatory Visit (INDEPENDENT_AMBULATORY_CARE_PROVIDER_SITE_OTHER): Payer: 59 | Admitting: Family Medicine

## 2015-10-11 ENCOUNTER — Other Ambulatory Visit (INDEPENDENT_AMBULATORY_CARE_PROVIDER_SITE_OTHER): Payer: 59

## 2015-10-11 VITALS — BP 138/82 | HR 84 | Temp 97.6°F | Wt 202.5 lb

## 2015-10-11 DIAGNOSIS — M1120 Other chondrocalcinosis, unspecified site: Secondary | ICD-10-CM | POA: Diagnosis not present

## 2015-10-11 DIAGNOSIS — M25462 Effusion, left knee: Secondary | ICD-10-CM

## 2015-10-11 DIAGNOSIS — E785 Hyperlipidemia, unspecified: Secondary | ICD-10-CM

## 2015-10-11 DIAGNOSIS — M1180 Other specified crystal arthropathies, unspecified site: Secondary | ICD-10-CM

## 2015-10-11 LAB — LIPID PANEL
CHOLESTEROL: 203 mg/dL — AB (ref 0–200)
HDL: 33.5 mg/dL — ABNORMAL LOW (ref >39.00)
Total CHOL/HDL Ratio: 6

## 2015-10-11 LAB — LDL CHOLESTEROL, DIRECT: Direct LDL: 67 mg/dL

## 2015-10-11 MED ORDER — COLCHICINE 0.6 MG PO CAPS: 1.0000 | ORAL_CAPSULE | ORAL | Status: DC

## 2015-10-11 MED ORDER — COLCHICINE 0.6 MG PO TABS: 0.6000 mg | ORAL_TABLET | ORAL | Status: DC

## 2015-10-11 MED ORDER — NAPROXEN 500 MG PO TABS: 500.0000 mg | ORAL_TABLET | Freq: Every evening | ORAL | Status: DC

## 2015-10-11 NOTE — Progress Notes (Signed)
BP 138/82 mmHg  Pulse 84  Temp(Src) 97.6 F (36.4 C) (Oral)  Wt 202 lb 8 oz (91.853 kg)   CC: check knee  Subjective:    Patient ID: Patrick Ochoa., male    DOB: [birth year], 55 y.o.   MRN: [id number]  HPI: Patrick Jannusch. is a 55 y.o. male presenting on 10/11/2015 for Knee Problem   Several month h/o L knee swelling, worse with prolonged time on feet. No significant pain just swelling and tightness worse at end of day. No redness or warmth of knee. Some instability.  Denies recent injury. Denies locking.  Has not regularly been taking colchicine. Has not taken any naprosyn either.   H/o pseudogout by xrays of hand and knee.  H/o ORIF L tibial plateau fracture with screw/pin in place.   Relevant past medical, surgical, family and social history reviewed and updated as indicated. Interim medical history since our last visit reviewed. Allergies and medications reviewed and updated. Current Outpatient Prescriptions on File Prior to Visit  Medication Sig  . amitriptyline (ELAVIL) 50 MG tablet TAKE ONE TABLET BY MOUTH AT BEDTIME  . amLODipine (NORVASC) 10 MG tablet Take 1 tablet (10 mg total) by mouth daily.  Marland Kitchen aspirin 325 MG tablet Take 325 mg by mouth daily.  Marland Kitchen atorvastatin (LIPITOR) 10 MG tablet Take 1 tablet (10 mg total) by mouth daily.  . fish oil-omega-3 fatty acids 1000 MG capsule Take 1 g by mouth daily.    Marland Kitchen GARLIC PO Take by mouth daily.  Marland Kitchen levothyroxine (SYNTHROID, LEVOTHROID) 150 MCG tablet TAKE ONE TABLET BY MOUTH ONCE DAILY  . metoprolol (LOPRESSOR) 100 MG tablet TAKE ONE TABLET BY MOUTH TWICE DAILY  . omeprazole (PRILOSEC) 20 MG capsule Take 20 mg by mouth daily.  . valACYclovir (VALTREX) 500 MG tablet TAKE ONE CAPLET BY MOUTH ONCE DAILY   No current facility-administered medications on file prior to visit.    Review of Systems Per HPI unless specifically indicated in ROS section     Objective:    BP 138/82 mmHg  Pulse 84  Temp(Src) 97.6 F (36.4  C) (Oral)  Wt 202 lb 8 oz (91.853 kg)  Wt Readings from Last 3 Encounters:  10/11/15 202 lb 8 oz (91.853 kg)  09/20/15 207 lb 12 oz (94.235 kg)  06/17/15 200 lb 6.4 oz (90.901 kg)    Physical Exam  Constitutional: He appears well-developed and well-nourished. No distress.  Musculoskeletal: He exhibits no edema.  R knee WNL L Knee exam: No deformity on inspection. No pain with palpation of knee landmarks. Mild swelling noted. FROM in flex/extension without crepitus. No popliteal fullness. Neg drawer test. Neg mcmurray test. No pain with valgus/varus stress. No PFgrind. No abnormal patellar mobility.   Skin: Skin is warm and dry. No rash noted. No erythema.  Psychiatric: He has a normal mood and affect.  Nursing note and vitals reviewed.     Assessment & Plan:   Problem List Items Addressed This Visit    Swelling of left knee joint - Primary    Without significant pain, no evidence of septic joint. No need for steroid injection or knee aspiration today. Treat with knee brace, rest, naprosyn + colchicine course x 1 wk. Update if not improved with treatment.      Calcium pyrophosphate crystal disease    rec colchicine daily x 1 wk then prn.          Follow up plan: Return if symptoms worsen or  fail to improve.

## 2015-10-11 NOTE — Patient Instructions (Addendum)
I do think this is pseudogout with some degenerative arthritis. Buy neoprene elastic sleeve brace for left knee Start taking colchicine 1 tablet in the morning and naprosyn 1 tablet when you get home.  Let us know if not better with this.

## 2015-10-11 NOTE — Telephone Encounter (Signed)
Med changed

## 2015-10-11 NOTE — Assessment & Plan Note (Signed)
rec colchicine daily x 1 wk then prn.

## 2015-10-11 NOTE — Telephone Encounter (Signed)
Insurance will only cover mitigare capsules. Ok to change? If so, please resend to [facility]. Thanks!

## 2015-10-11 NOTE — Assessment & Plan Note (Signed)
Without significant pain, no evidence of septic joint. No need for steroid injection or knee aspiration today. Treat with knee brace, rest, naprosyn + colchicine course x 1 wk. Update if not improved with treatment.

## 2015-10-11 NOTE — Progress Notes (Signed)
Pre visit review using our clinic review tool, if applicable. No additional management support is needed unless otherwise documented below in the visit note. 

## 2015-10-11 NOTE — Addendum Note (Signed)
Addended by: Daralene Milch C on: 10/11/2015 01:27 PM   Modules accepted: Miquel Dunn

## 2015-10-12 ENCOUNTER — Encounter: Payer: Self-pay | Admitting: Family Medicine

## 2015-10-12 ENCOUNTER — Other Ambulatory Visit: Payer: Self-pay | Admitting: Family Medicine

## 2015-10-12 DIAGNOSIS — E781 Pure hyperglyceridemia: Secondary | ICD-10-CM

## 2015-10-12 MED ORDER — FENOFIBRATE 145 MG PO TABS: 145.0000 mg | ORAL_TABLET | Freq: Every day | ORAL | Status: DC

## 2015-10-22 ENCOUNTER — Other Ambulatory Visit: Payer: Self-pay | Admitting: *Deleted

## 2015-10-22 ENCOUNTER — Telehealth: Payer: Self-pay | Admitting: *Deleted

## 2015-10-22 NOTE — Telephone Encounter (Signed)
PA required for fenofibrate 145 mg. Completed and denied because patient must try and fail or have intolerance to fenofibrate 54 and 160 mg. In you IN box for review.

## 2015-10-23 MED ORDER — FENOFIBRATE 160 MG PO TABS: 160.0000 mg | ORAL_TABLET | Freq: Every day | ORAL | Status: DC

## 2015-10-23 NOTE — Telephone Encounter (Signed)
I have sent in 160mg  dose.

## 2015-12-27 ENCOUNTER — Other Ambulatory Visit: Payer: 59

## 2016-01-10 ENCOUNTER — Other Ambulatory Visit: Payer: Self-pay | Admitting: Family Medicine

## 2016-03-13 ENCOUNTER — Encounter (HOSPITAL_BASED_OUTPATIENT_CLINIC_OR_DEPARTMENT_OTHER): Payer: Self-pay | Admitting: *Deleted

## 2016-03-16 ENCOUNTER — Encounter (HOSPITAL_BASED_OUTPATIENT_CLINIC_OR_DEPARTMENT_OTHER)
Admission: RE | Admit: 2016-03-16 | Discharge: 2016-03-16 | Disposition: A | Discharge status: Home / Self Care | Payer: 59 | Source: Ambulatory Visit | Attending: Orthopedic Surgery | Admitting: Orthopedic Surgery

## 2016-03-16 ENCOUNTER — Other Ambulatory Visit: Payer: Self-pay

## 2016-03-16 DIAGNOSIS — Z7982 Long term (current) use of aspirin: Secondary | ICD-10-CM | POA: Diagnosis not present

## 2016-03-16 DIAGNOSIS — Z79899 Other long term (current) drug therapy: Secondary | ICD-10-CM | POA: Diagnosis not present

## 2016-03-16 DIAGNOSIS — K219 Gastro-esophageal reflux disease without esophagitis: Secondary | ICD-10-CM | POA: Diagnosis not present

## 2016-03-16 DIAGNOSIS — Z87891 Personal history of nicotine dependence: Secondary | ICD-10-CM | POA: Diagnosis not present

## 2016-03-16 DIAGNOSIS — I10 Essential (primary) hypertension: Secondary | ICD-10-CM | POA: Diagnosis not present

## 2016-03-16 DIAGNOSIS — M1990 Unspecified osteoarthritis, unspecified site: Secondary | ICD-10-CM | POA: Diagnosis not present

## 2016-03-16 DIAGNOSIS — E039 Hypothyroidism, unspecified: Secondary | ICD-10-CM | POA: Diagnosis not present

## 2016-03-16 DIAGNOSIS — D1721 Benign lipomatous neoplasm of skin and subcutaneous tissue of right arm: Secondary | ICD-10-CM | POA: Diagnosis not present

## 2016-03-16 DIAGNOSIS — R2231 Localized swelling, mass and lump, right upper limb: Secondary | ICD-10-CM | POA: Diagnosis present

## 2016-03-19 ENCOUNTER — Encounter (HOSPITAL_BASED_OUTPATIENT_CLINIC_OR_DEPARTMENT_OTHER)
Admission: RE | Disposition: A | Discharge status: Home / Self Care | Payer: Self-pay | Source: Ambulatory Visit | Attending: Orthopedic Surgery

## 2016-03-19 ENCOUNTER — Encounter (HOSPITAL_BASED_OUTPATIENT_CLINIC_OR_DEPARTMENT_OTHER): Payer: Self-pay | Admitting: Certified Registered"

## 2016-03-19 ENCOUNTER — Ambulatory Visit (HOSPITAL_BASED_OUTPATIENT_CLINIC_OR_DEPARTMENT_OTHER): Payer: 59 | Admitting: Anesthesiology

## 2016-03-19 ENCOUNTER — Ambulatory Visit (HOSPITAL_BASED_OUTPATIENT_CLINIC_OR_DEPARTMENT_OTHER)
Admission: RE | Admit: 2016-03-19 | Discharge: 2016-03-19 | Disposition: A | Discharge status: Home / Self Care | Payer: 59 | Source: Ambulatory Visit | Attending: Orthopedic Surgery | Admitting: Orthopedic Surgery

## 2016-03-19 DIAGNOSIS — Z87891 Personal history of nicotine dependence: Secondary | ICD-10-CM | POA: Insufficient documentation

## 2016-03-19 DIAGNOSIS — Z79899 Other long term (current) drug therapy: Secondary | ICD-10-CM | POA: Insufficient documentation

## 2016-03-19 DIAGNOSIS — D1721 Benign lipomatous neoplasm of skin and subcutaneous tissue of right arm: Secondary | ICD-10-CM | POA: Diagnosis not present

## 2016-03-19 DIAGNOSIS — I10 Essential (primary) hypertension: Secondary | ICD-10-CM | POA: Insufficient documentation

## 2016-03-19 DIAGNOSIS — K219 Gastro-esophageal reflux disease without esophagitis: Secondary | ICD-10-CM | POA: Insufficient documentation

## 2016-03-19 DIAGNOSIS — M1990 Unspecified osteoarthritis, unspecified site: Secondary | ICD-10-CM | POA: Insufficient documentation

## 2016-03-19 DIAGNOSIS — Z7982 Long term (current) use of aspirin: Secondary | ICD-10-CM | POA: Insufficient documentation

## 2016-03-19 DIAGNOSIS — E039 Hypothyroidism, unspecified: Secondary | ICD-10-CM | POA: Insufficient documentation

## 2016-03-19 HISTORY — DX: Essential (primary) hypertension: I10

## 2016-03-19 HISTORY — DX: Gastro-esophageal reflux disease without esophagitis: K21.9

## 2016-03-19 HISTORY — DX: Unspecified osteoarthritis, unspecified site: M19.90

## 2016-03-19 HISTORY — DX: Gout, unspecified: M10.9

## 2016-03-19 HISTORY — PX: MASS EXCISION: SHX2000

## 2016-03-19 SURGERY — EXCISION MASS
Anesthesia: General | Site: Wrist | Laterality: Right

## 2016-03-19 MED ORDER — BUPIVACAINE HCL (PF) 0.25 % IJ SOLN
INTRAMUSCULAR | Status: DC | PRN
  Administered 2016-03-19: 10 mL

## 2016-03-19 MED ORDER — LIDOCAINE 2% (20 MG/ML) 5 ML SYRINGE
INTRAMUSCULAR | Status: AC
  Filled 2016-03-19: qty 5

## 2016-03-19 MED ORDER — DEXAMETHASONE SODIUM PHOSPHATE 10 MG/ML IJ SOLN
INTRAMUSCULAR | Status: AC
  Filled 2016-03-19: qty 1

## 2016-03-19 MED ORDER — HYDROCODONE-ACETAMINOPHEN 5-325 MG PO TABS
1.0000 | ORAL_TABLET | Freq: Once | ORAL | Status: AC
Start: 2016-03-19 — End: 2016-03-19
  Administered 2016-03-19: 1 via ORAL

## 2016-03-19 MED ORDER — SCOPOLAMINE 1 MG/3DAYS TD PT72: 1.0000 | MEDICATED_PATCH | Freq: Once | TRANSDERMAL | Status: DC | PRN

## 2016-03-19 MED ORDER — FENTANYL CITRATE (PF) 100 MCG/2ML IJ SOLN: 25.0000 ug | INTRAMUSCULAR | Status: DC | PRN

## 2016-03-19 MED ORDER — PROPOFOL 10 MG/ML IV BOLUS
INTRAVENOUS | Status: DC | PRN
  Administered 2016-03-19: 300 mg via INTRAVENOUS

## 2016-03-19 MED ORDER — MIDAZOLAM HCL 2 MG/2ML IJ SOLN
INTRAMUSCULAR | Status: AC
  Filled 2016-03-19: qty 2

## 2016-03-19 MED ORDER — HYDROCODONE-ACETAMINOPHEN 5-325 MG PO TABS
ORAL_TABLET | ORAL | Status: AC
  Filled 2016-03-19: qty 1

## 2016-03-19 MED ORDER — 0.9 % SODIUM CHLORIDE (POUR BTL) OPTIME
TOPICAL | Status: DC | PRN
  Administered 2016-03-19: 400 mL

## 2016-03-19 MED ORDER — HYDROCODONE-ACETAMINOPHEN 7.5-325 MG PO TABS: 1.0000 | ORAL_TABLET | Freq: Once | ORAL | Status: DC | PRN

## 2016-03-19 MED ORDER — CHLORHEXIDINE GLUCONATE 4 % EX LIQD: 60.0000 mL | Freq: Once | CUTANEOUS | Status: DC

## 2016-03-19 MED ORDER — GLYCOPYRROLATE 0.2 MG/ML IJ SOLN: 0.2000 mg | Freq: Once | INTRAMUSCULAR | Status: DC | PRN

## 2016-03-19 MED ORDER — ONDANSETRON HCL 4 MG/2ML IJ SOLN: 4.0000 mg | Freq: Once | INTRAMUSCULAR | Status: DC | PRN

## 2016-03-19 MED ORDER — HYDROCODONE-ACETAMINOPHEN 5-325 MG PO TABS: ORAL_TABLET | ORAL | 0 refills | Status: DC

## 2016-03-19 MED ORDER — CEFAZOLIN SODIUM 1 G IJ SOLR
2.0000 g | INTRAMUSCULAR | Status: AC
  Administered 2016-03-19: 2 g via INTRAVENOUS

## 2016-03-19 MED ORDER — FENTANYL CITRATE (PF) 100 MCG/2ML IJ SOLN
50.0000 ug | INTRAMUSCULAR | Status: DC | PRN
  Administered 2016-03-19 (×2): 50 ug via INTRAVENOUS

## 2016-03-19 MED ORDER — ONDANSETRON HCL 4 MG/2ML IJ SOLN
INTRAMUSCULAR | Status: DC | PRN
Start: 2016-03-19 — End: 2016-03-19
  Administered 2016-03-19: 4 mg via INTRAVENOUS

## 2016-03-19 MED ORDER — DEXAMETHASONE SODIUM PHOSPHATE 10 MG/ML IJ SOLN
INTRAMUSCULAR | Status: DC | PRN
  Administered 2016-03-19: 10 mg via INTRAVENOUS

## 2016-03-19 MED ORDER — FENTANYL CITRATE (PF) 100 MCG/2ML IJ SOLN
INTRAMUSCULAR | Status: AC
  Filled 2016-03-19: qty 2

## 2016-03-19 MED ORDER — MIDAZOLAM HCL 2 MG/2ML IJ SOLN
1.0000 mg | INTRAMUSCULAR | Status: DC | PRN
  Administered 2016-03-19: 2 mg via INTRAVENOUS

## 2016-03-19 MED ORDER — CEFAZOLIN SODIUM-DEXTROSE 2-4 GM/100ML-% IV SOLN
INTRAVENOUS | Status: AC
  Filled 2016-03-19: qty 100

## 2016-03-19 MED ORDER — LACTATED RINGERS IV SOLN
INTRAVENOUS | Status: DC
  Administered 2016-03-19 (×2): via INTRAVENOUS

## 2016-03-19 MED ORDER — LIDOCAINE 2% (20 MG/ML) 5 ML SYRINGE
INTRAMUSCULAR | Status: DC | PRN
  Administered 2016-03-19: 80 mg via INTRAVENOUS

## 2016-03-19 MED ORDER — ONDANSETRON HCL 4 MG/2ML IJ SOLN
INTRAMUSCULAR | Status: AC
  Filled 2016-03-19: qty 2

## 2016-03-19 SURGICAL SUPPLY — 50 items
BANDAGE ACE 3X5.8 VEL STRL LF (GAUZE/BANDAGES/DRESSINGS) ×2 IMPLANT
BANDAGE COBAN STERILE 2 (GAUZE/BANDAGES/DRESSINGS) IMPLANT
BENZOIN TINCTURE PRP APPL 2/3 (GAUZE/BANDAGES/DRESSINGS) IMPLANT
BLADE MINI RND TIP GREEN BEAV (BLADE) IMPLANT
BLADE SURG 15 STRL LF DISP TIS (BLADE) ×2 IMPLANT
BLADE SURG 15 STRL SS (BLADE) ×2
BNDG COHESIVE 1X5 TAN STRL LF (GAUZE/BANDAGES/DRESSINGS) IMPLANT
BNDG CONFORM 2 STRL LF (GAUZE/BANDAGES/DRESSINGS) IMPLANT
BNDG ELASTIC 2X5.8 VLCR STR LF (GAUZE/BANDAGES/DRESSINGS) IMPLANT
BNDG ESMARK 4X9 LF (GAUZE/BANDAGES/DRESSINGS) ×2 IMPLANT
BNDG GAUZE 1X2.1 STRL (MISCELLANEOUS) IMPLANT
BNDG GAUZE ELAST 4 BULKY (GAUZE/BANDAGES/DRESSINGS) ×2 IMPLANT
BNDG PLASTER X FAST 3X3 WHT LF (CAST SUPPLIES) IMPLANT
CHLORAPREP W/TINT 26ML (MISCELLANEOUS) ×2 IMPLANT
CORDS BIPOLAR (ELECTRODE) ×2 IMPLANT
COVER BACK TABLE 60X90IN (DRAPES) ×2 IMPLANT
COVER MAYO STAND STRL (DRAPES) ×2 IMPLANT
CUFF TOURNIQUET SINGLE 18IN (TOURNIQUET CUFF) ×2 IMPLANT
DRAPE EXTREMITY T 121X128X90 (DRAPE) ×2 IMPLANT
DRAPE SURG 17X23 STRL (DRAPES) ×2 IMPLANT
GAUZE SPONGE 4X4 12PLY STRL (GAUZE/BANDAGES/DRESSINGS) ×2 IMPLANT
GAUZE XEROFORM 1X8 LF (GAUZE/BANDAGES/DRESSINGS) ×2 IMPLANT
GLOVE BIO SURGEON STRL SZ7.5 (GLOVE) ×2 IMPLANT
GLOVE BIOGEL PI IND STRL 8 (GLOVE) ×2 IMPLANT
GLOVE BIOGEL PI INDICATOR 8 (GLOVE) ×2
GLOVE EXAM NITRILE MD LF STRL (GLOVE) ×2 IMPLANT
GLOVE SURG SS PI 8.0 STRL IVOR (GLOVE) ×2 IMPLANT
GOWN STRL REUS W/ TWL LRG LVL3 (GOWN DISPOSABLE) ×1 IMPLANT
GOWN STRL REUS W/TWL LRG LVL3 (GOWN DISPOSABLE) ×1
GOWN STRL REUS W/TWL XL LVL3 (GOWN DISPOSABLE) ×2 IMPLANT
NEEDLE HYPO 25X1 1.5 SAFETY (NEEDLE) ×2 IMPLANT
NS IRRIG 1000ML POUR BTL (IV SOLUTION) ×2 IMPLANT
PACK BASIN DAY SURGERY FS (CUSTOM PROCEDURE TRAY) ×2 IMPLANT
PAD CAST 3X4 CTTN HI CHSV (CAST SUPPLIES) ×1 IMPLANT
PAD CAST 4YDX4 CTTN HI CHSV (CAST SUPPLIES) IMPLANT
PADDING CAST ABS 4INX4YD NS (CAST SUPPLIES)
PADDING CAST ABS COTTON 4X4 ST (CAST SUPPLIES) IMPLANT
PADDING CAST COTTON 3X4 STRL (CAST SUPPLIES) ×1
PADDING CAST COTTON 4X4 STRL (CAST SUPPLIES)
STOCKINETTE 4X48 STRL (DRAPES) ×2 IMPLANT
STRIP CLOSURE SKIN 1/2X4 (GAUZE/BANDAGES/DRESSINGS) IMPLANT
SUT ETHILON 3 0 PS 1 (SUTURE) IMPLANT
SUT ETHILON 4 0 PS 2 18 (SUTURE) ×2 IMPLANT
SUT ETHILON 5 0 P 3 18 (SUTURE)
SUT NYLON ETHILON 5-0 P-3 1X18 (SUTURE) IMPLANT
SUT VIC AB 4-0 P2 18 (SUTURE) IMPLANT
SYR BULB 3OZ (MISCELLANEOUS) ×2 IMPLANT
SYR CONTROL 10ML LL (SYRINGE) ×2 IMPLANT
TOWEL OR 17X24 6PK STRL BLUE (TOWEL DISPOSABLE) ×4 IMPLANT
UNDERPAD 30X30 (UNDERPADS AND DIAPERS) ×2 IMPLANT

## 2016-03-19 NOTE — Op Note (Signed)
[id number] 

## 2016-03-19 NOTE — Brief Op Note (Signed)
03/19/2016  10:47 AM  PATIENT:  [name].  55 y.o. male  PRE-OPERATIVE DIAGNOSIS:  RIGHT WRIST MASS  POST-OPERATIVE DIAGNOSIS:  RIGHT WRIST MASS  PROCEDURE:  Procedure(s): EXCISION MASS RIGHT WRIST (Right)  SURGEON:  Surgeon(s) and Role:    * Leanora Cover, MD - Primary  PHYSICIAN ASSISTANT:   ASSISTANTS: none   ANESTHESIA:   general  EBL:  Total I/O In: 1500 [I.V.:1500] Out: 2 [Blood:2]  BLOOD ADMINISTERED:none  DRAINS: none   LOCAL MEDICATIONS USED:  MARCAINE     SPECIMEN:  Source of Specimen:  right wrist  DISPOSITION OF SPECIMEN:  PATHOLOGY  COUNTS:  YES  TOURNIQUET:   Total Tourniquet Time Documented: Upper Arm (Right) - 48 minutes Total: Upper Arm (Right) - 48 minutes   DICTATION: .Other Dictation: Dictation Number [id number]  PLAN OF CARE: Discharge to home after PACU  PATIENT DISPOSITION:  PACU - hemodynamically stable.

## 2016-03-19 NOTE — Anesthesia Postprocedure Evaluation (Signed)
Anesthesia Post Note  Patient: Patrick Bailey.  Procedure(s) Performed: Procedure(s) (LRB): EXCISION MASS RIGHT WRIST (Right)  Patient location during evaluation: PACU Anesthesia Type: General Level of consciousness: awake and alert Pain management: pain level controlled Vital Signs Assessment: post-procedure vital signs reviewed and stable Respiratory status: spontaneous breathing, nonlabored ventilation, respiratory function stable and patient connected to nasal cannula oxygen Cardiovascular status: blood pressure returned to baseline and stable Postop Assessment: no signs of nausea or vomiting Anesthetic complications: no    Last Vitals:  Vitals:   03/19/16 1100 03/19/16 1115  BP:  128/89  Pulse: 71 71  Resp: (!) 21 14  Temp:      Last Pain:  Vitals:   03/19/16 1130  TempSrc:   PainSc: 0-No pain                 Zenaida Deed

## 2016-03-19 NOTE — Anesthesia Preprocedure Evaluation (Addendum)
Anesthesia Evaluation  Patient identified by MRN, date of birth, ID band Patient awake    Reviewed: Allergy & Precautions, H&P , NPO status , Patient's Chart, lab work & pertinent test results  History of Anesthesia Complications Negative for: history of anesthetic complications  Airway Mallampati: II  TM Distance: >3 FB Neck ROM: full    Dental no notable dental hx.    Pulmonary former smoker,    Pulmonary exam normal breath sounds clear to auscultation       Cardiovascular hypertension, Normal cardiovascular exam Rhythm:regular Rate:Normal     Neuro/Psych PSYCHIATRIC DISORDERS Anxiety negative neurological ROS     GI/Hepatic Neg liver ROS, GERD  ,  Endo/Other  Hypothyroidism   Renal/GU negative Renal ROS     Musculoskeletal  (+) Arthritis ,   Abdominal   Peds  Hematology negative hematology ROS (+)   Anesthesia Other Findings   Reproductive/Obstetrics negative OB ROS                             Anesthesia Physical Anesthesia Plan  ASA: II  Anesthesia Plan: General   Post-op Pain Management:    Induction: Intravenous  Airway Management Planned: LMA  Additional Equipment:   Intra-op Plan:   Post-operative Plan: Extubation in OR  Informed Consent: I have reviewed the patients History and Physical, chart, labs and discussed the procedure including the risks, benefits and alternatives for the proposed anesthesia with the patient or authorized representative who has indicated his/her understanding and acceptance.   Dental Advisory Given  Plan Discussed with: Anesthesiologist, CRNA and Surgeon  Anesthesia Plan Comments: (Patient denies bier block option, desires GA)       Anesthesia Quick Evaluation

## 2016-03-19 NOTE — Transfer of Care (Signed)
Immediate Anesthesia Transfer of Care Note  Patient: Patrick Bailey.  Procedure(s) Performed: Procedure(s): EXCISION MASS RIGHT WRIST (Right)  Patient Location: PACU  Anesthesia Type:General  Level of Consciousness: awake, sedated and responds to stimulation  Airway & Oxygen Therapy: Patient Spontanous Breathing and Patient connected to face mask oxygen  Post-op Assessment: Report given to RN, Post -op Vital signs reviewed and stable and Patient moving all extremities  Post vital signs: Reviewed and stable  Last Vitals:  Vitals:   03/19/16 0725  BP: (!) 143/93  Pulse: 73  Resp: 16  Temp: 36.6 C    Last Pain:  Vitals:   03/19/16 0725  TempSrc: Oral      Patients Stated Pain Goal: 0 ([id number])  Complications: No apparent anesthesia complications

## 2016-03-19 NOTE — H&P (Signed)
Patrick Bailey. is an 55 y.o. male.   Chief Complaint: right wrist mass HPI: 55 yo rhd male states he has had a mass of right wrist  X 1 year.  It causes an aching pain.  He wishes to have it removed.  Allergies: No Known Allergies  Past Medical History:  Diagnosis Date  . Alcohol use ([place])   . Arthritis   . CPDD (calcium pyrophosphate deposition disease) 10/2013   R hand xray, L knee xray  . GERD (gastroesophageal reflux disease)   . Gout   . History of smoking   . HTN (hypertension)   . Hypertension   . Hypothyroidism (acquired)   . Insomnia   . Throat cancer ([place])    Dr. Caryl P[facility])  . TOBACCO ABUSE, HX OF 02/05/2009   Quit 2011      Past Surgical History:  Procedure Laterality Date  . oral cancer removal  2010   [facility] (oral surgeon)  . SKIN LESION EXCISION  1/22012   Forehead pyogenic granuloma [name]    Family History: Family History  Problem Relation Age of Onset  . Coronary artery disease Father     MI  . Hypertension Father     Social History:   reports that he quit smoking about 7 years ago. His smoking use included Cigarettes. He has a 7.50 pack-year smoking history. He has never used smokeless tobacco. He reports that he drinks about 3.6 oz of alcohol per week . He reports that he does not use drugs.  Medications: Medications Prior to Admission  Medication Sig Dispense Refill  . amitriptyline (ELAVIL) 50 MG tablet TAKE ONE TABLET BY MOUTH AT BEDTIME 30 tablet 11  . amLODipine (NORVASC) 10 MG tablet Take 1 tablet (10 mg total) by mouth daily. 30 tablet 11  . aspirin 325 MG tablet Take 325 mg by mouth daily.    . Colchicine (MITIGARE) 0.6 MG CAPS Take 1 capsule by mouth every morning. For 1 week then as needed 30 capsule 3  . fenofibrate 160 MG tablet Take 1 tablet (160 mg total) by mouth daily. 30 tablet 11  . fish oil-omega-3 fatty acids 1000 MG capsule Take 1 g by mouth daily.      Marland Kitchen levothyroxine (SYNTHROID, LEVOTHROID) 150 MCG tablet TAKE  ONE TABLET BY MOUTH ONCE DAILY 30 tablet 11  . metoprolol (LOPRESSOR) 100 MG tablet TAKE ONE TABLET BY MOUTH TWICE DAILY 60 tablet 11  . omeprazole (PRILOSEC) 20 MG capsule Take 20 mg by mouth daily.    Marland Kitchen GARLIC PO Take by mouth daily.      No results found for this or any previous visit (from the past 48 hour(s)).  No results found.   A comprehensive review of systems was negative.  Blood pressure (!) 143/93, pulse 73, temperature 97.9 F (36.6 C), temperature source Oral, resp. rate 16, height 5\' 11"  (1.803 m), weight 88 kg (194 lb), SpO2 99 %.  General appearance: alert, cooperative and appears stated age Head: Normocephalic, without obvious abnormality, atraumatic Neck: supple, symmetrical, trachea midline Resp: clear to auscultation bilaterally Cardio: regular rate and rhythm GI: non-tender Extremities: Intact sensation and capillary refill all digits.  +epl/fpl/io.  No wounds.  Pulses: 2+ and symmetric Skin: Skin color, texture, turgor normal. No rashes or lesions Neurologic: Grossly normal Incision/Wound:none  Assessment/Plan Right wrist mass.  Non operative and operative treatment options were discussed with the patient and patient wishes to proceed with operative treatment. Risks, benefits, and alternatives of  surgery were discussed and the patient agrees with the plan of care.   Patrick Bailey R 03/19/2016, 9:20 AM

## 2016-03-19 NOTE — Op Note (Signed)
NAMEZACKERI, DOUCETTE              ACCOUNT NO.:  [id number]  MEDICAL RECORD NO.:  [id number]  LOCATION:                                 FACILITY:  PHYSICIAN:  Leanora Cover, MD             DATE OF BIRTH:  DATE OF PROCEDURE:  03/19/2016 DATE OF DISCHARGE:                              OPERATIVE REPORT   PREOPERATIVE DIAGNOSIS:  Right wrist mass.  POSTOPERATIVE DIAGNOSIS:  Right wrist lipoma.  PROCEDURE:  Excision of subfascial mass, right wrist, measuring 4.5 cm x 2.5 cm x 1 cm.  SURGEON:  Leanora Cover, MD.  ASSISTANT:  None.  ANESTHESIA:  General.  IV FLUIDS:  Per anesthesia flow sheet.  ESTIMATED BLOOD LOSS:  Minimal.  COMPLICATIONS:  None.  SPECIMENS:  Right wrist mass to Pathology.  TOURNIQUET TIME:  48 minutes.  DISPOSITION:  Stable to PACU.  INDICATIONS:  Mr. Caporusso is a 55 year old male, who has noted a mass in his right wrist for approximately year.  It is bothersome to him.  He wished to have it removed.  Risks, benefits, and alternative of surgery were discussed including risk of blood loss; infection; damage to nerves, vessels, tendons, ligaments, bone; failure of surgery; need for additional surgery; complications with wound healing; continued pain; recurrence of mass.  He voiced understanding of these risks and elected to proceed.  OPERATIVE COURSE:  After being identified preoperatively by myself, the patient and I agreed upon procedure and site of procedure.  Surgical site was marked.  The risks, benefits, and alternatives of the surgery were reviewed and he wished to proceed.  Surgical consent had been signed.  He was given IV Ancef as preoperative antibiotic prophylaxis. He was transferred to the operating room and placed on the operating room table in supine position with the right upper extremity on arm board.  General anesthesia induced by Anesthesiology.  Right upper extremity was prepped and draped in normal sterile orthopedic fashion. Surgical  pause was performed between surgeons, anesthesia, and operating staff; and all were in agreement as to the patient, procedure, and site of procedure.  Tourniquet at the proximal aspect of the extremity was inflated to 250 mmHg after exsanguination of the limb with Esmarch bandage.  Incision was made in a zigzag fashion at the volar aspect of the wrist over the mass.  This was carried into subcutaneous tissues by spreading technique.  Bipolar electrocautery was used to obtain hemostasis.  The mass was identified and cleared of soft tissue attachments.  It appeared to be a fatty mass.  It was not a ganglion. The radial artery was identified and protected.  The mass coursed to the ulnar side of the radial artery deep into the wrist toward the radial side of the radius.  An incision was then made over the mass in the dorsum of the wrist.  This again was carried into subcutaneous tissues by spreading technique.  Bipolar electrocautery was used to obtain hemostasis.  The mass was freed of any soft tissue attachments.  It was deep to the fascia which was incised.  The radial artery was stuck to the mass at its proximal aspect.  This  was carefully protected.  The mass was cleared of soft tissue attachments.  The mass was contiguous. It was not able to be brought out through a single wound.  It was then released at its nerve point and each section taken out through its respective wound.  It measured approximately 4.5 cm x 2.5 cm x 1 cm. The wound was inspected.  No mass appeared to be remaining.  The mass was adherent at its deep portion.  The wound was copiously irrigated with sterile saline and closed with 4-0 nylon in a horizontal mattress fashion.  It was injected with 10 mL of 0.25% plain Marcaine to aid in postoperative analgesia.  The radial artery was visualized, it was intact.  The wound was then dressed with sterile Xeroform, 4x4s, and ABD and wrapped with Kerlix and Ace bandage.   Tourniquet deflated at 48 minutes.  Fingertips were all pink with brisk capillary refill after deflation of tourniquet.  Operative drapes were broken down, and the patient was awoken from anesthesia safely.  He was transferred back to stretcher and taken to PACU in stable condition.  I will see him back in the office in 1 week for postoperative followup.  I will give him Norco 5/325, 1-2 p.o. q.6 hours p.r.n. pain, dispensed #30.     Leanora Cover, MD     KK/MEDQ  D:  03/19/2016  T:  03/19/2016  Job:  [id number]

## 2016-03-19 NOTE — Anesthesia Procedure Notes (Signed)
Procedure Name: LMA Insertion Date/Time: 03/19/2016 9:48 AM Performed by: Baxter Flattery Pre-anesthesia Checklist: Patient identified, Emergency Drugs available, Suction available and Patient being monitored Patient Re-evaluated:Patient Re-evaluated prior to inductionOxygen Delivery Method: Circle system utilized Preoxygenation: Pre-oxygenation with 100% oxygen Intubation Type: IV induction Ventilation: Mask ventilation without difficulty LMA: LMA inserted LMA Size: 5.0 Number of attempts: 1 Airway Equipment and Method: Bite block Placement Confirmation: positive ETCO2 and breath sounds checked- equal and bilateral Tube secured with: Tape Dental Injury: Teeth and Oropharynx as per pre-operative assessment

## 2016-03-19 NOTE — Discharge Instructions (Addendum)
[facility] Instructions °Hand Surgery ° °Wound Care: °Keep your hand elevated above the level of your heart.  Do not allow it to dangle by your side.  Keep the dressing dry and do not remove it unless your doctor advises you to do so.  He will usually change it at the time of your post-op visit.  Moving your fingers is advised to stimulate circulation but will depend on the site of your surgery.  If you have a splint applied, your doctor will advise you regarding movement. ° °Activity: °Do not drive or operate machinery today.  Rest today and then you may return to your normal activity and work as indicated by your physician. ° °Diet:  °Drink liquids today or eat a light diet.  You may resume a regular diet tomorrow.   ° ° ° °Post Anesthesia Home Care Instructions ° °Activity: °Get plenty of rest for the remainder of the day. A responsible adult should stay with you for 24 hours following the procedure.  °For the next 24 hours, DO NOT: °-Drive a car °-Operate machinery °-Drink alcoholic beverages °-Take any medication unless instructed by your physician °-Make any legal decisions or sign important papers. ° °Meals: °Start with liquid foods such as gelatin or soup. Progress to regular foods as tolerated. Avoid greasy, spicy, heavy foods. If nausea and/or vomiting occur, drink only clear liquids until the nausea and/or vomiting subsides. Call your physician if vomiting continues. ° °Special Instructions/Symptoms: °Your throat may feel dry or sore from the anesthesia or the breathing tube placed in your throat during surgery. If this causes discomfort, gargle with warm salt water. The discomfort should disappear within 24 hours. ° °If you had a scopolamine patch placed behind your ear for the management of post- operative nausea and/or vomiting: ° °1. The medication in the patch is effective for 72 hours, after which it should be removed.  Wrap patch in a tissue and discard in the trash. Wash hands thoroughly with  soap and water. °2. You may remove the patch earlier than 72 hours if you experience unpleasant side effects which may include dry mouth, dizziness or visual disturbances. °3. Avoid touching the patch. Wash your hands with soap and water after contact with the patch. °  ° °General expectations: °Pain for two to three days. °Fingers may become slightly swollen. ° °Call your doctor if any of the following occur: °Severe pain not relieved by pain medication. °Elevated temperature. °Dressing soaked with blood. °Inability to move fingers. °White or bluish color to fingers. ° °

## 2016-03-20 ENCOUNTER — Encounter (HOSPITAL_BASED_OUTPATIENT_CLINIC_OR_DEPARTMENT_OTHER): Payer: Self-pay | Admitting: Orthopedic Surgery

## 2016-03-21 ENCOUNTER — Encounter: Payer: Self-pay | Admitting: Family Medicine

## 2016-04-24 ENCOUNTER — Other Ambulatory Visit: Payer: Self-pay | Admitting: Family Medicine

## 2016-05-26 ENCOUNTER — Emergency Department (HOSPITAL_COMMUNITY)
Admission: EM | Admit: 2016-05-26 | Discharge: 2016-05-26 | Disposition: A | Discharge status: Home / Self Care | Payer: 59 | Attending: Emergency Medicine | Admitting: Emergency Medicine

## 2016-05-26 ENCOUNTER — Telehealth: Payer: Self-pay | Admitting: Family Medicine

## 2016-05-26 ENCOUNTER — Encounter (HOSPITAL_COMMUNITY): Payer: Self-pay | Admitting: Emergency Medicine

## 2016-05-26 ENCOUNTER — Encounter: Payer: Self-pay | Admitting: Internal Medicine

## 2016-05-26 ENCOUNTER — Ambulatory Visit: Payer: 59 | Admitting: Internal Medicine

## 2016-05-26 DIAGNOSIS — Z87891 Personal history of nicotine dependence: Secondary | ICD-10-CM | POA: Insufficient documentation

## 2016-05-26 DIAGNOSIS — Z8521 Personal history of malignant neoplasm of larynx: Secondary | ICD-10-CM | POA: Insufficient documentation

## 2016-05-26 DIAGNOSIS — I10 Essential (primary) hypertension: Secondary | ICD-10-CM | POA: Diagnosis not present

## 2016-05-26 DIAGNOSIS — Z1211 Encounter for screening for malignant neoplasm of colon: Secondary | ICD-10-CM

## 2016-05-26 DIAGNOSIS — R319 Hematuria, unspecified: Secondary | ICD-10-CM | POA: Diagnosis present

## 2016-05-26 DIAGNOSIS — E039 Hypothyroidism, unspecified: Secondary | ICD-10-CM | POA: Diagnosis not present

## 2016-05-26 DIAGNOSIS — Z87448 Personal history of other diseases of urinary system: Secondary | ICD-10-CM

## 2016-05-26 DIAGNOSIS — Z7982 Long term (current) use of aspirin: Secondary | ICD-10-CM | POA: Insufficient documentation

## 2016-05-26 LAB — CBC
HCT: 41.1 % (ref 39.0–52.0)
Hemoglobin: 14.4 g/dL (ref 13.0–17.0)
MCH: 32.7 pg (ref 26.0–34.0)
MCHC: 35 g/dL (ref 30.0–36.0)
MCV: 93.4 fL (ref 78.0–100.0)
PLATELETS: 218 10*3/uL (ref 150–400)
RBC: 4.4 MIL/uL (ref 4.22–5.81)
RDW: 12.6 % (ref 11.5–15.5)
WBC: 5 10*3/uL (ref 4.0–10.5)

## 2016-05-26 LAB — COMPREHENSIVE METABOLIC PANEL
ALBUMIN: 4 g/dL (ref 3.5–5.0)
ALT: 51 U/L (ref 17–63)
AST: 33 U/L (ref 15–41)
Alkaline Phosphatase: 68 U/L (ref 38–126)
Anion gap: 10 (ref 5–15)
BUN: 12 mg/dL (ref 6–20)
CHLORIDE: 100 mmol/L — AB (ref 101–111)
CO2: 26 mmol/L (ref 22–32)
CREATININE: 1 mg/dL (ref 0.61–1.24)
Calcium: 9.3 mg/dL (ref 8.9–10.3)
GFR calc Af Amer: >60 mL/min (ref >60)
GFR calc non Af Amer: >60 mL/min (ref >60)
Glucose, Bld: 104 mg/dL — ABNORMAL HIGH (ref 65–99)
POTASSIUM: 4.1 mmol/L (ref 3.5–5.1)
SODIUM: 136 mmol/L (ref 135–145)
Total Bilirubin: 0.7 mg/dL (ref 0.3–1.2)
Total Protein: 7 g/dL (ref 6.5–8.1)

## 2016-05-26 LAB — URINALYSIS, ROUTINE W REFLEX MICROSCOPIC
BILIRUBIN URINE: NEGATIVE
Glucose, UA: NEGATIVE mg/dL
Hgb urine dipstick: NEGATIVE
KETONES UR: NEGATIVE mg/dL
Leukocytes, UA: NEGATIVE
NITRITE: NEGATIVE
PROTEIN: NEGATIVE mg/dL
SPECIFIC GRAVITY, URINE: 1.014 (ref 1.005–1.030)
pH: 6 (ref 5.0–8.0)

## 2016-05-26 LAB — PROTIME-INR
INR: 0.93
Prothrombin Time: 12.5 seconds (ref 11.4–15.2)

## 2016-05-26 NOTE — ED Provider Notes (Signed)
[facility] DEPT Provider Note   CSN: [id number] Arrival date & time: 05/26/16  [id number]     History   Chief Complaint Chief Complaint  Patient presents with  . Hematuria     HPI Patrick Asbill. is a 55 y.o. male.  HPI   55 year old man history of head and neck cancer presents today stating that he has had blood in his urine. Patient states that he waited 2 AM and noted blood in his urine with some "chunks". He did not think there was blood in his urine this morning. He gave a urine sample prior to my evaluation. He denies any similar symptoms in the past. He states he has had some creased frequency of urination. He denies fever, chills, nausea, vomiting, or diarrhea. Mild suprapubic pressure described as 4 out of 10 on and off for 3 weeks which he states got better when he stopped drinking alcohol several weeks ago. He restarted drinking and feels that it has returned. Is drinking a 12 pack a day but states he is now drinking a sixpack a day. He denies any change in his voice although he feels he may have some change in his swallowing. Does not have any weight loss or weight gain  Past Medical History:  Diagnosis Date  . Alcohol use   . Arthritis   . CPDD (calcium pyrophosphate deposition disease) 10/2013   R hand xray, L knee xray  . GERD (gastroesophageal reflux disease)   . Gout   . History of smoking   . HTN (hypertension)   . Hypertension   . Hypothyroidism (acquired)   . Insomnia   . Throat cancer ([place])    Dr. Caryl Pina [facility])  . TOBACCO ABUSE, HX OF 02/05/2009   Quit 2011      Patient Active Problem List   Diagnosis Date Noted  . Health maintenance examination 06/17/2015  . Mass of right wrist 06/17/2015  . Hypertriglyceridemia 06/17/2015  . Hypothyroidism (acquired)   . Swelling of left knee joint 10/19/2014  . Calcium pyrophosphate crystal disease 10/19/2014  . Chest pain 03/12/2014  . Olecranon bursitis of right elbow 05/27/2012  . Essential hypertension  08/13/2010  . Alcohol use ([place]) 02/05/2009  . DEPRESSION, SITUATIONAL 02/05/2009  . TOBACCO ABUSE, HX OF 02/05/2009  . HSV 07/24/2008  . ANXIETY 07/24/2008  . INSOMNIA 07/24/2008  . THROAT CANCER 05/30/2008  . SHOULDER PAIN, RIGHT 12/20/2006    Past Surgical History:  Procedure Laterality Date  . MASS EXCISION Right 03/19/2016   EXCISION LIPOMA RIGHT WRIST;  Surgeon: Leanora Cover, MD  . NECK SURGERY  2010   oral cancer excision - [facility] ([facility])  . SKIN LESION EXCISION  08/2010   Forehead pyogenic granuloma [name]       Home Medications    Prior to Admission medications   Medication Sig Start Date End Date Taking? Authorizing Provider  amitriptyline (ELAVIL) 50 MG tablet TAKE ONE TABLET BY MOUTH AT BEDTIME 01/10/16   Ria Bush, MD  amLODipine (NORVASC) 10 MG tablet Take 1 tablet (10 mg total) by mouth daily. 06/17/15   Ria Bush, MD  aspirin 325 MG tablet Take 325 mg by mouth daily.    Historical Provider, MD  Colchicine 0.6 MG CAPS TAKE ONE CAPSULE BY MOUTH IN THE MORNING FOR  ONE  WEEK  THEN  AS  NEEDED 04/24/16   Ria Bush, MD  fenofibrate 160 MG tablet Take 1 tablet (160 mg total) by mouth daily. 10/23/15   Ria Bush,  MD  fish oil-omega-3 fatty acids 1000 MG capsule Take 1 g by mouth daily.      Historical Provider, MD  GARLIC PO Take by mouth daily.    Historical Provider, MD  HYDROcodone-acetaminophen [facility]) 5-325 MG tablet 1-2 tabs po q6 hours prn pain 03/19/16   Leanora Cover, MD  levothyroxine (SYNTHROID, LEVOTHROID) 150 MCG tablet TAKE ONE TABLET BY MOUTH ONCE DAILY 08/22/15   Ria Bush, MD  metoprolol (LOPRESSOR) 100 MG tablet TAKE ONE TABLET BY MOUTH TWICE DAILY 06/17/15   Ria Bush, MD  omeprazole (PRILOSEC) 20 MG capsule Take 20 mg by mouth daily.    Historical Provider, MD    Family History Family History  Problem Relation Age of Onset  . Coronary artery disease Father     MI  . Hypertension Father     Social  History Social History  Substance Use Topics  . Smoking status: Former Smoker    Packs/day: 0.50    Years: 15.00    Types: Cigarettes    Quit date: 08/03/2008  . Smokeless tobacco: Never Used  . Alcohol use 3.6 oz/week    6 Cans of beer per week     Comment: Beer-every other day or so     Allergies   Review of patient's allergies indicates no known allergies.   Review of Systems Review of Systems   Physical Exam Updated Vital Signs BP 142/96 (BP Location: Right Arm)   Pulse 74   Temp 97.4 F (36.3 C) (Oral)   Ht 6' (1.829 m)   Wt 90.7 kg   SpO2 99%   BMI 27.12 kg/m   Physical Exam  Constitutional: He is oriented to person, place, and time. He appears well-developed and well-nourished.  HENT:  Head: Normocephalic and atraumatic.  Right Ear: External ear normal.  Left Ear: External ear normal.  Nose: Nose normal.  Mouth/Throat: Oropharynx is clear and moist.  Eyes: Conjunctivae and EOM are normal. Pupils are equal, round, and reactive to light.  Neck: Normal range of motion. Neck supple.  Cardiovascular: Normal rate, regular rhythm, normal heart sounds and intact distal pulses.   Pulmonary/Chest: Effort normal and breath sounds normal. No respiratory distress. He has no wheezes. He exhibits no tenderness.  Abdominal: Soft. Bowel sounds are normal. He exhibits no distension and no mass. There is no tenderness. There is no guarding.  Musculoskeletal: Normal range of motion.  Neurological: He is alert and oriented to person, place, and time. He has normal reflexes. He exhibits normal muscle tone. Coordination normal.  Skin: Skin is warm and dry.  Psychiatric: He has a normal mood and affect. His behavior is normal. Judgment and thought content normal.  Nursing note and vitals reviewed.    ED Treatments / Results  Labs (all labs ordered are listed, but only abnormal results are displayed) Labs Reviewed  URINALYSIS, ROUTINE W REFLEX MICROSCOPIC (NOT AT [facility])     EKG  EKG Interpretation None       Radiology No results found.  Procedures Procedures (including critical care time)  Medications Ordered in ED Medications - No data to display   Initial Impression / Assessment and Plan / ED Course  I have reviewed the triage vital signs and the nursing notes.  Pertinent labs & imaging results that were available during my care of the patient were reviewed by me and considered in my medical decision making (see chart for details).  Clinical Course   55 y.o. Male with report of hematuria.  Work up here normal. Urine without blood and labs normal.  Discussed return precautions and need for follow up and patient voices understanding.   Final Clinical Impressions(s) / ED Diagnoses   Final diagnoses:  History of hematuria    New Prescriptions New Prescriptions   No medications on file     Pattricia Boss, MD 05/27/16 1110

## 2016-05-26 NOTE — Telephone Encounter (Signed)
Pt called wanting colonoscopy as soon as we can get scheduled.  Explained that we can get the first available for him, he understood it may not be as soon as he would like.  Offered a hospital followup appointment with PCP, pt declined.  Pt stated he was concerned his cancer "was back".  Best number to call pt is [phone], spoke with patient to let him know that [facility] would be contacting him.  Explained need for ER followup, pt agreed.

## 2016-05-26 NOTE — ED Notes (Signed)
Patient brushing his teeth when RN entered the room. Patient refused to change into gown. Also, RN informed patient that an urine specimen is needed. Patient stated "I'll finish my Coke first." Patient then sat down on stool.

## 2016-05-26 NOTE — ED Triage Notes (Signed)
Patient presents to ED with reports of groin pain and blood in urine since last night. Pain 5/10.

## 2016-05-26 NOTE — Telephone Encounter (Signed)
Would agree with ER f/u office visit but patient declined.  Colonoscopy referral placed.

## 2016-05-26 NOTE — ED Notes (Signed)
Attempted to help pt change into gown pt states "I haven't even been seen by a doctor why do I have to change "I explained to pt that the reason you should change is so the provider can exam you pt still refused primary RN aware

## 2016-05-26 NOTE — ED Notes (Signed)
Papers reviewed and pt. Verbalizes understanding. Leaving ambulatory

## 2016-05-26 NOTE — Discharge Instructions (Signed)
No blood was seen in your urine here.  Your labs are normal.  Please follow up with your primary care doctor.

## 2016-05-27 ENCOUNTER — Ambulatory Visit: Payer: 59 | Admitting: Internal Medicine

## 2016-06-12 ENCOUNTER — Ambulatory Visit: Payer: 59 | Admitting: Family Medicine

## 2016-06-12 DIAGNOSIS — Z0289 Encounter for other administrative examinations: Secondary | ICD-10-CM

## 2016-06-16 ENCOUNTER — Telehealth: Payer: Self-pay | Admitting: Family Medicine

## 2016-06-16 ENCOUNTER — Other Ambulatory Visit: Payer: Self-pay | Admitting: Family Medicine

## 2016-06-16 NOTE — Telephone Encounter (Signed)
Patient did not come in for their appointment on 06/12/16 for ed follow up. Please let me know if patient needs to be contacted immediately for follow up or no follow up needed.

## 2016-06-19 NOTE — Telephone Encounter (Signed)
No f/u at this time. 

## 2016-07-11 ENCOUNTER — Emergency Department: Payer: 59

## 2016-07-11 ENCOUNTER — Encounter: Payer: Self-pay | Admitting: Radiology

## 2016-07-11 ENCOUNTER — Emergency Department
Admission: EM | Admit: 2016-07-11 | Discharge: 2016-07-11 | Disposition: A | Discharge status: Home / Self Care | Payer: 59 | Attending: Emergency Medicine | Admitting: Emergency Medicine

## 2016-07-11 DIAGNOSIS — E039 Hypothyroidism, unspecified: Secondary | ICD-10-CM | POA: Diagnosis not present

## 2016-07-11 DIAGNOSIS — R42 Dizziness and giddiness: Secondary | ICD-10-CM | POA: Diagnosis not present

## 2016-07-11 DIAGNOSIS — Z87891 Personal history of nicotine dependence: Secondary | ICD-10-CM | POA: Insufficient documentation

## 2016-07-11 DIAGNOSIS — Z79899 Other long term (current) drug therapy: Secondary | ICD-10-CM | POA: Diagnosis not present

## 2016-07-11 DIAGNOSIS — I10 Essential (primary) hypertension: Secondary | ICD-10-CM | POA: Insufficient documentation

## 2016-07-11 LAB — CBC
HEMATOCRIT: 43.7 % (ref 40.0–52.0)
HEMOGLOBIN: 15.4 g/dL (ref 13.0–18.0)
MCH: 33 pg (ref 26.0–34.0)
MCHC: 35.3 g/dL (ref 32.0–36.0)
MCV: 93.4 fL (ref 80.0–100.0)
Platelets: 256 10*3/uL (ref 150–440)
RBC: 4.68 MIL/uL (ref 4.40–5.90)
RDW: 12.3 % (ref 11.5–14.5)
WBC: 6 10*3/uL (ref 3.8–10.6)

## 2016-07-11 LAB — BASIC METABOLIC PANEL
Anion gap: 8 (ref 5–15)
BUN: 13 mg/dL (ref 6–20)
CALCIUM: 9.4 mg/dL (ref 8.9–10.3)
CHLORIDE: 101 mmol/L (ref 101–111)
CO2: 26 mmol/L (ref 22–32)
CREATININE: 0.93 mg/dL (ref 0.61–1.24)
GFR calc Af Amer: >60 mL/min (ref >60)
GFR calc non Af Amer: >60 mL/min (ref >60)
GLUCOSE: 120 mg/dL — AB (ref 65–99)
Potassium: 3.7 mmol/L (ref 3.5–5.1)
Sodium: 135 mmol/L (ref 135–145)

## 2016-07-11 LAB — VITAMIN B12: Vitamin B-12: 282 pg/mL (ref 180–914)

## 2016-07-11 LAB — TROPONIN I: Troponin I: <0.03 ng/mL (ref <0.03)

## 2016-07-11 LAB — FOLATE: FOLATE: 12.7 ng/mL (ref >5.9)

## 2016-07-11 MED ORDER — PANTOPRAZOLE SODIUM 20 MG PO TBEC
20.0000 mg | DELAYED_RELEASE_TABLET | Freq: Once | ORAL | Status: AC
  Administered 2016-07-11: 20 mg via ORAL

## 2016-07-11 MED ORDER — PANTOPRAZOLE SODIUM 40 MG PO TBEC
DELAYED_RELEASE_TABLET | ORAL | Status: AC
  Administered 2016-07-11: 20 mg via ORAL
  Filled 2016-07-11: qty 1

## 2016-07-11 MED ORDER — ASPIRIN 81 MG PO CHEW
324.0000 mg | CHEWABLE_TABLET | Freq: Once | ORAL | Status: AC
  Administered 2016-07-11: 324 mg via ORAL
  Filled 2016-07-11: qty 4

## 2016-07-11 MED ORDER — IOPAMIDOL (ISOVUE-370) INJECTION 76%
75.0000 mL | Freq: Once | INTRAVENOUS | Status: AC | PRN
  Administered 2016-07-11: 75 mL via INTRAVENOUS

## 2016-07-11 MED ORDER — MECLIZINE HCL 12.5 MG PO TABS: 12.5000 mg | ORAL_TABLET | Freq: Three times a day (TID) | ORAL | 1 refills | Status: DC | PRN

## 2016-07-11 NOTE — ED Notes (Signed)
Pt taken to MRI  

## 2016-07-11 NOTE — ED Notes (Signed)

## 2016-07-11 NOTE — Discharge Instructions (Signed)
Begin taking a baby aspirin (81 mg) daily.  We believe your symptoms were caused by benign vertigo.  Please read through the included information and take any prescribed medication(s).  Follow up with your doctor as listed above.  If you develop any new or worsening symptoms that concern you, including but not limited to persistent dizziness/vertigo, numbness or weakness in your arms or legs, altered mental status, persistent vomiting, or fever greater than 101, please return immediately to the Emergency Department.

## 2016-07-11 NOTE — ED Notes (Signed)
Pt returned from MRI. Pt hooked back up to the monitor by this RN. Pt is alert and oriented. Will continue to monitor for further patient needs.

## 2016-07-11 NOTE — ED Notes (Signed)
This RN to bedside at this time. NAD noted. Pt resting in bed with friend at bedside. Pt requesting to know when he would go to MRI or have something to eat. This RN explained delay to patient, will continue to monitor for further patient needs.

## 2016-07-11 NOTE — ED Provider Notes (Signed)
[facility] Emergency Department Provider Note   ____________________________________________   First MD Initiated Contact with Patient 07/11/16 [phone]     (approximate)  I have reviewed the triage vital signs and the nursing notes.   HISTORY  Chief Complaint Neurologic Problem    HPI Patrick Bailey. is a 55 y.o. male previous medical history of high blood pressure, previous smoker, head and neck cancer, alcohol use.  Patient reports that 11:00 yesterday morning, while walking he suddenly started feeling very dizzy. He said he "almost fell into the ditch" because he felt so off balance and keeps lifts listing to the side whenever he tries to walk. States he doesn't really notice much when he sitting still, but whenever he goes to walk or move it start feeling very off balance. He does not express a trouble speaking, no numbness or tingling in the hands or face, no specific weakness in any arms or legs reports his balance just seems off. Is able to use his arms well, hasn't noticed trouble using her hands or picking things up.  No pain. No recent illness or infection. He had a little trouble with his left ear that felt blocked a while ago, but this is resolved. Denies any history of vertigo.   Past Medical History:  Diagnosis Date  . Alcohol use   . Arthritis   . CPDD (calcium pyrophosphate deposition disease) 10/2013   R hand xray, L knee xray  . GERD (gastroesophageal reflux disease)   . Gout   . History of smoking   . HTN (hypertension)   . Hypertension   . Hypothyroidism (acquired)   . Insomnia   . Throat cancer ([place])    Dr. Caryl [facility])  . TOBACCO ABUSE, HX OF 02/05/2009   Quit 2011      Patient Active Problem List   Diagnosis Date Noted  . Health maintenance examination 06/17/2015  . Mass of right wrist 06/17/2015  . Hypertriglyceridemia 06/17/2015  . Hypothyroidism (acquired)   . Swelling of left knee joint 10/19/2014  . Calcium  pyrophosphate crystal disease 10/19/2014  . Chest pain 03/12/2014  . Olecranon bursitis of right elbow 05/27/2012  . Essential hypertension 08/13/2010  . Alcohol use ([place]) 02/05/2009  . DEPRESSION, SITUATIONAL 02/05/2009  . TOBACCO ABUSE, HX OF 02/05/2009  . HSV 07/24/2008  . ANXIETY 07/24/2008  . INSOMNIA 07/24/2008  . THROAT CANCER 05/30/2008  . SHOULDER PAIN, RIGHT 12/20/2006    Past Surgical History:  Procedure Laterality Date  . MASS EXCISION Right 03/19/2016   EXCISION LIPOMA RIGHT WRIST;  Surgeon: Leanora Cover, MD  . NECK SURGERY  2010   oral cancer excision - [place] ([facility])  . SKIN LESION EXCISION  08/2010   Forehead pyogenic granuloma [name]    Prior to Admission medications   Medication Sig Start Date End Date Taking? Authorizing Provider  amitriptyline (ELAVIL) 50 MG tablet TAKE ONE TABLET BY MOUTH AT BEDTIME 01/10/16  Yes Ria Bush, MD  amLODipine (NORVASC) 10 MG tablet TAKE ONE TABLET BY MOUTH ONCE DAILY 06/16/16  Yes Ria Bush, MD  aspirin 325 MG tablet Take 325 mg by mouth daily.   Yes Historical Provider, MD  Colchicine 0.6 MG CAPS TAKE ONE CAPSULE BY MOUTH IN THE MORNING FOR  ONE  WEEK  THEN  AS  NEEDED 04/24/16  Yes Ria Bush, MD  fenofibrate 160 MG tablet Take 1 tablet (160 mg total) by mouth daily. 10/23/15  Yes Ria Bush, MD  fish oil-omega-3  fatty acids 1000 MG capsule Take 1 g by mouth daily.     Yes Historical Provider, MD  GARLIC PO Take by mouth daily.   Yes Historical Provider, MD  levothyroxine (SYNTHROID, LEVOTHROID) 150 MCG tablet TAKE ONE TABLET BY MOUTH ONCE DAILY 08/22/15  Yes Ria Bush, MD  metoprolol (LOPRESSOR) 100 MG tablet TAKE ONE TABLET BY MOUTH TWICE DAILY 06/17/15  Yes Ria Bush, MD  omeprazole (PRILOSEC) 20 MG capsule Take 20 mg by mouth daily.   Yes Historical Provider, MD  HYDROcodone-acetaminophen [facility]) 5-325 MG tablet 1-2 tabs po q6 hours prn pain 03/19/16   Leanora Cover, MD  meclizine  (ANTIVERT) 12.5 MG tablet Take 1 tablet (12.5 mg total) by mouth 3 (three) times daily as needed for dizziness or nausea. 07/11/16   Delman Kitten, MD    Allergies Patient has no known allergies.  Family History  Problem Relation Age of Onset  . Coronary artery disease Father     MI  . Hypertension Father     Social History Social History  Substance Use Topics  . Smoking status: Former Smoker    Packs/day: 0.50    Years: 15.00    Types: Cigarettes    Quit date: 08/03/2008  . Smokeless tobacco: Never Used  . Alcohol use 3.6 oz/week    6 Cans of beer per week     Comment: Beer-every other day or so    Review of Systems Constitutional: No fever/chills Eyes: Feels like his vision has just been slightly off, treated to the feeling of feeling off balance. He is not lost any vision, and denies any changes in except occasionally it feels just off, Described much more. ENT: No sore throat. Cardiovascular: Denies chest pain. Respiratory: Denies shortness of breath. Gastrointestinal: No abdominal pain.  No nausea, no vomiting.   Musculoskeletal: Negative for back pain. No neck pain. Skin: Negative for rash. Neurological: Negative for headaches, focal weakness or numbness.  10-point ROS otherwise negative.  ____________________________________________   PHYSICAL EXAM:  VITAL SIGNS: ED Triage Vitals [07/11/16 0911]  Enc Vitals Group     BP (!) 150/87     Pulse Rate 76     Resp 20     Temp 97.6 F (36.4 C)     Temp Source Oral     SpO2 98 %     Weight 200 lb (90.7 kg)     Height 6' (1.829 m)     Head Circumference      Peak Flow      Pain Score      Pain Loc      Pain Edu?      Excl. in [place]?     Constitutional: Alert and oriented. Well appearing and in no acute distress. Eyes: Conjunctivae are normal. PERRL. EOMI. Head: Atraumatic. Nose: No congestion/rhinnorhea. Mouth/Throat: Mucous membranes are moist.  Oropharynx non-erythematous.Old scarring right lateral  neck. Neck: No stridor.   Cardiovascular: Normal rate, regular rhythm. Grossly normal heart sounds.  Good peripheral circulation. Respiratory: Normal respiratory effort.  No retractions. Lungs CTAB. Gastrointestinal: Soft and nontender. No distention. No abdominal bruits. No CVA tenderness. Musculoskeletal: No lower extremity tenderness nor edema.  No joint effusions. Neurologic:  Normal speech and language. No gross focal neurologic deficits are appreciated.  NIH score equals 0, performed by me at bedside. The patient has no pronator drift. The patient has normal cranial nerve exam. Extraocular movements are normal. Visual fields are normal. Patient has 5 out of 5 strength in all  extremities. There is no numbness or gross, acute sensory abnormality in the extremities bilaterally. No speech disturbance. No dysarthria. No aphasia. No ataxia. Normal finger nose finger bilat. Patient speaking in full and clear sentences.   Skin:  Skin is warm, dry and intact. No rash noted. Psychiatric: Mood and affect are normal. Speech and behavior are normal.  ____________________________________________   LABS (all labs ordered are listed, but only abnormal results are displayed)  Labs Reviewed  BASIC METABOLIC PANEL - Abnormal; Notable for the following:       Result Value   Glucose, Bld 120 (*)    All other components within normal limits  CBC  TROPONIN I  VITAMIN B12  FOLATE   ____________________________________________  EKG  ED ECG REPORT I, Jashiya Bassett, the attending physician, personally viewed and interpreted this ECG.  Date: 07/11/2016 EKG Time: 920 Rate: 75 Rhythm: normal sinus rhythm QRS Axis: normal Intervals: normal ST/T Wave abnormalities: normal Conduction Disturbances: none Narrative Interpretation: unremarkable  ____________________________________________  RADIOLOGY  Ct Angio Head W Or Wo Contrast  Result Date: 07/11/2016 CLINICAL DATA:  Difficulty  walking/balance EXAM: CT ANGIOGRAPHY HEAD AND NECK TECHNIQUE: Multidetector CT imaging of the head and neck was performed using the standard protocol during bolus administration of intravenous contrast. Multiplanar CT image reconstructions and MIPs were obtained to evaluate the vascular anatomy. Carotid stenosis measurements (when applicable) are obtained utilizing NASCET criteria, using the distal internal carotid diameter as the denominator. CONTRAST:  75 mL Isovue 370 IV COMPARISON:  MRI head 07/11/2016 FINDINGS: CT HEAD FINDINGS Brain: Ventricle size normal. Negative for acute infarct. Negative for hemorrhage or mass. Skull: Negative Sinuses: Mild mucosal edema paranasal sinuses. Orbits: Negative Review of the MIP images confirms the above findings CTA NECK FINDINGS Aortic arch: Atherosclerotic calcification aortic arch. Left common carotid origin from the innominate artery, a normal variant. Right carotid system: Right common carotid artery widely patent. Mild atherosclerotic disease in the carotid bulb on the right without significant stenosis. Surgical clips in the region of the bifurcation likely due to prior carotid endarterectomy. Left carotid system: Mild stenosis distal common carotid artery at the bifurcation. Mild atherosclerotic disease in the left internal carotid artery without significant stenosis. External carotid artery widely patent. Vertebral arteries: Left vertebral artery dominant and widely patent to the basilar. Small right vertebral artery ends in muscular branches below the foramen magnum. Review of the prior MRI does reveal a normal size distal right vertebral artery which appears occluded. Skeleton: Cervical disc degeneration and spurring C5-6 and C6-7. No acute skeletal abnormality. Other neck: Negative for mass or adenopathy. Upper chest: Negative Review of the MIP images confirms the above findings CTA HEAD FINDINGS Anterior circulation: Mild atherosclerotic calcification in the  cavernous carotid bilaterally without stenosis. Anterior and middle cerebral arteries patent bilaterally without stenosis. No vascular malformation. Posterior circulation: Left vertebral artery dominant and widely patent without stenosis. Left PICA patent. Small right vertebral artery ends in muscular branches below the foramen magnum. There is some retrograde flow in the distal right vertebral artery which then occludes. The basilar is widely patent. Right AICA patent. Superior cerebellar and posterior cerebral arteries patent. Fetal origin left posterior cerebral artery. Venous sinuses: Patent Anatomic variants: None Delayed phase: Normal enhancement on delayed imaging. Review of the MIP images confirms the above findings IMPRESSION: Surgical clips around the right carotid bifurcation suggesting prior carotid endarterectomy. No significant right carotid stenosis. Mild atherosclerotic disease left carotid bifurcation without significant stenosis Left vertebral artery dominant widely patent. Small  right vertebral artery ends in muscular branches below the foramen magnum. 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 No acute intracranial abnormality. Electronically Signed   By: Franchot Gallo M.D.   On: 07/11/2016 14:55   Ct Angio Neck W And/or Wo Contrast  Result Date: 07/11/2016 CLINICAL DATA:  Difficulty walking/balance EXAM: CT ANGIOGRAPHY HEAD AND NECK TECHNIQUE: Multidetector CT imaging of the head and neck was performed using the standard protocol during bolus administration of intravenous contrast. Multiplanar CT image reconstructions and MIPs were obtained to evaluate the vascular anatomy. Carotid stenosis measurements (when applicable) are obtained utilizing NASCET criteria, using the distal internal carotid diameter as the denominator. CONTRAST:  75 mL Isovue 370 IV COMPARISON:  MRI head 07/11/2016 FINDINGS: CT HEAD FINDINGS Brain: Ventricle size normal.  Negative for acute infarct. Negative for hemorrhage or mass. Skull: Negative Sinuses: Mild mucosal edema paranasal sinuses. Orbits: Negative Review of the MIP images confirms the above findings CTA NECK FINDINGS Aortic arch: Atherosclerotic calcification aortic arch. Left common carotid origin from the innominate artery, a normal variant. Right carotid system: Right common carotid artery widely patent. Mild atherosclerotic disease in the carotid bulb on the right without significant stenosis. Surgical clips in the region of the bifurcation likely due to prior carotid endarterectomy. Left carotid system: Mild stenosis distal common carotid artery at the bifurcation. Mild atherosclerotic disease in the left internal carotid artery without significant stenosis. External carotid artery widely patent. Vertebral arteries: Left vertebral artery dominant and widely patent to the basilar. Small right vertebral artery ends in muscular branches below the foramen magnum. Review of the prior MRI does reveal a normal size distal right vertebral artery which appears occluded. Skeleton: Cervical disc degeneration and spurring C5-6 and C6-7. No acute skeletal abnormality. Other neck: Negative for mass or adenopathy. Upper chest: Negative Review of the MIP images confirms the above findings CTA HEAD FINDINGS Anterior circulation: Mild atherosclerotic calcification in the cavernous carotid bilaterally without stenosis. Anterior and middle cerebral arteries patent bilaterally without stenosis. No vascular malformation. Posterior circulation: Left vertebral artery dominant and widely patent without stenosis. Left PICA patent. Small right vertebral artery ends in muscular branches below the foramen magnum. There is some retrograde flow in the distal right vertebral artery which then occludes. The basilar is widely patent. Right AICA patent. Superior cerebellar and posterior cerebral arteries patent. Fetal origin left posterior cerebral  artery. Venous sinuses: Patent Anatomic variants: None Delayed phase: Normal enhancement on delayed imaging. Review of the MIP images confirms the above findings IMPRESSION: Surgical clips around the right carotid bifurcation suggesting prior carotid endarterectomy. No significant right carotid stenosis. Mild atherosclerotic disease left carotid bifurcation without significant stenosis Left vertebral artery dominant widely patent. Small right vertebral artery ends in muscular branches below the foramen magnum. 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 No acute intracranial abnormality. Electronically Signed   By: Franchot Gallo M.D.   On: 07/11/2016 14:55   Mr Brain Wo Contrast  Result Date: 07/11/2016 CLINICAL DATA:  Difficulty with gait 24 hours.  Rule out stroke EXAM: MRI HEAD WITHOUT CONTRAST TECHNIQUE: Multiplanar, multiecho pulse sequences of the brain and surrounding structures were obtained without intravenous contrast. COMPARISON:  None. FINDINGS: Brain: Negative for acute infarct. Ventricle size and cerebral volume normal. Scattered small white matter hyperintensities consistent with mild chronic microvascular ischemia. Negative for hemorrhage or mass. Vascular: Loss of flow void in the distal right vertebral artery which is probably  occluded. Left vertebral artery and basilar patent. Both internal carotid arteries are patent bilaterally. Skull and upper cervical spine: Negative Sinuses/Orbits: Mucosal edema in the paranasal sinuses. Normal orbit. Other: None IMPRESSION: Negative for acute infarct. Mild chronic microvascular ischemic change in the white matter. Probable occlusion of the distal right vertebral artery of indeterminate age. Electronically Signed   By: Franchot Gallo M.D.   On: 07/11/2016 13:13    ____________________________________________   PROCEDURES  Procedure(s) performed: None  Procedures  Critical Care performed:  No  ____________________________________________   INITIAL IMPRESSION / ASSESSMENT AND PLAN / ED COURSE  Pertinent labs & imaging results that were available during my care of the patient were reviewed by me and considered in my medical decision making (see chart for details).  Patient presents for evaluation of poor balance and difficulty walking that suddenly occurred suddenly about 11 AM yesterday. His neurologic exam appears to be normal at this time, but I have not yet tested his gait. He does report that certainly he is having trouble walking and balance is felt off. He carries a previous history of smoking, hypertension, and also previous head and neck cancer. I am suspicious for vertigo,'s reports the symptoms are most prominent when up and moving or walking. I attempted date's Hallpike but did not identify an obvious inducing of vertigo. Based on his previous history, risk factors we'll proceed with MRI the brain without contrast as I feel it is most likely peripheral or bone on vertigo, however wish to exclude stroke as a possible diagnosis versus tumor or mass.  Clinical Course as of Jul 11 1552  Sat Jul 11, 2016  1354 Reviewed MRI results with Dr. Irish Elders. He advises obtaining a CT angiography of the head neck, follow-up on results with him. Still likely to be discharged home, as no acute infarction is noted on MRI.  [MQ]    Clinical Course User Index [MQ] Delman Kitten, MD    ----------------------------------------- 3:53 PM on 07/11/2016 -----------------------------------------  Careful return precautions and follow-up discussed with patient. He does work as a [employer] social research officer, advised him that he should follow up with his primary, provided time off through Monday. Patient understanding that he is to be extremely careful, not to be up at heights until his vertigo/symptoms have resolved. Patient agreeable. Friend driving.  Return precautions and treatment recommendations and  follow-up discussed with the patient who is agreeable with the plan.  ____________________________________________   FINAL CLINICAL IMPRESSION(S) / ED DIAGNOSES  Final diagnoses:  Vertigo      NEW MEDICATIONS STARTED DURING THIS VISIT:  New Prescriptions   MECLIZINE (ANTIVERT) 12.5 MG TABLET    Take 1 tablet (12.5 mg total) by mouth 3 (three) times daily as needed for dizziness or nausea.     Note:  This document was prepared using Dragon voice recognition software and may include unintentional dictation errors.     Delman Kitten, MD 07/11/16 [phone]

## 2016-07-11 NOTE — ED Triage Notes (Signed)
Pt reports since yesterday at about lunch time he has had unsteady gait swaying to the right and blurry vision in left eye.

## 2016-07-11 NOTE — ED Notes (Signed)
Pt transported to CT ?

## 2016-07-11 NOTE — ED Notes (Signed)
This RN explained to patient that MRI had a patient they were currently working on but that he was next. Pt states understanding at this time. Pt noted to be mildly agitated at this time due to having to wait. Will continue to check on patient for further patient needs at this time.

## 2016-07-11 NOTE — ED Notes (Signed)
Pt states at this time, "If they don't have me to MRI by 1pm, I'm probably going to leave".

## 2016-07-14 ENCOUNTER — Ambulatory Visit (INDEPENDENT_AMBULATORY_CARE_PROVIDER_SITE_OTHER): Payer: 59 | Admitting: Internal Medicine

## 2016-07-14 ENCOUNTER — Encounter: Payer: Self-pay | Admitting: Internal Medicine

## 2016-07-14 VITALS — BP 124/84 | HR 81 | Temp 98.0°F | Wt 203.0 lb

## 2016-07-14 DIAGNOSIS — H6123 Impacted cerumen, bilateral: Secondary | ICD-10-CM | POA: Diagnosis not present

## 2016-07-14 DIAGNOSIS — R270 Ataxia, unspecified: Secondary | ICD-10-CM

## 2016-07-14 DIAGNOSIS — R42 Dizziness and giddiness: Secondary | ICD-10-CM | POA: Diagnosis not present

## 2016-07-14 LAB — TSH: TSH: 1.22 u[IU]/mL (ref 0.35–4.50)

## 2016-07-14 LAB — T4, FREE: Free T4: 1.08 ng/dL (ref 0.60–1.60)

## 2016-07-14 NOTE — Patient Instructions (Signed)
Dizziness Dizziness is a common problem. It makes you feel unsteady or lightheaded. You may feel like you are about to pass out (faint). Dizziness can lead to injury if you stumble or fall. Anyone can get dizzy, but dizziness is more common in older adults. This condition can be caused by a number of things, including:  Medicines.  Dehydration.  Illness. Follow these instructions at home: Following these instructions may help with your condition: Eating and drinking  Drink enough fluid to keep your pee (urine) clear or pale yellow. This helps to keep you from getting dehydrated. Try to drink more clear fluids, such as water.  Do not drink alcohol.  Limit how much caffeine you drink or eat if told by your doctor.  Limit how much salt you drink or eat if told by your doctor. Activity  Avoid making quick movements.  When you stand up from sitting in a chair, steady yourself until you feel okay.  In the morning, first sit up on the side of the bed. When you feel okay, stand slowly while you hold onto something. Do this until you know that your balance is fine.  Move your legs often if you need to stand in one place for a long time. Tighten and relax your muscles in your legs while you are standing.  Do not drive or use heavy machinery if you feel dizzy.  Avoid bending down if you feel dizzy. Place items in your home so that they are easy for you to reach without leaning over. Lifestyle  Do not use any tobacco products, including cigarettes, chewing tobacco, or electronic cigarettes. If you need help quitting, ask your doctor.  Try to lower your stress level, such as with yoga or meditation. Talk with your doctor if you need help. General instructions  Watch your dizziness for any changes.  Take medicines only as told by your doctor. Talk with your doctor if you think that your dizziness is caused by a medicine that you are taking.  Tell a friend or a family member that you are  feeling dizzy. If he or she notices any changes in your behavior, have this person call your doctor.  Keep all follow-up visits as told by your doctor. This is important. Contact a doctor if:  Your dizziness does not go away.  Your dizziness or light-headedness gets worse.  You feel sick to your stomach (nauseous).  You have trouble hearing.  You have new symptoms.  You are unsteady on your feet or you feel like the room is spinning. Get help right away if:  You throw up (vomit) or have diarrhea and are unable to eat or drink anything.  You have trouble:  Talking.  Walking.  Swallowing.  Using your arms, hands, or legs.  You feel generally weak.  You are not thinking clearly or you have trouble forming sentences. It may take a friend or family member to notice this.  You have:  Chest pain.  Pain in your belly (abdomen).  Shortness of breath.  Sweating.  Your vision changes.  You are bleeding.  You have a headache.  You have neck pain or a stiff neck.  You have a fever. This information is not intended to replace advice given to you by your health care provider. Make sure you discuss any questions you have with your health care provider. Document Released: 07/09/2011 Document Revised: 12/26/2015 Document Reviewed: 07/16/2014 Elsevier Interactive Patient Education  2017 [employer]  

## 2016-07-14 NOTE — Progress Notes (Signed)
Subjective:    Patient ID: Patrick Bailey., male    DOB: [birth year], 55 y.o.   MRN: [id number]  HPI  Pt presents to the clinic today for ER follow up for Vertigo. He went to the ER on 12/9 with c/o dizziness. ECG was unchanged. Labs were normal. He had a MRI brian which showed no acute findings. He had a CTA head/neck which showed mild calcifications in the carotids bilaterally, without stenosis, otherwise no acute changes. He was given a RX for Meclizine and advised to follow up with PCP. He reports the Meclizine has not helped. The dizziness is constant, except not at night. He describes it more as unsteadiness as opposed to the room spinning. It is worse with position changes and walking. Laying down make it better. He denies confusion, headache, ear pain, runny nose, nasal congestion, cough, chest pain or shortness of breath. He does note some blurred vision but reports this is not new. He denies changes in diet or medications recently. He does report remote history of head and neck cancer, s/p excision. He report when they did this surgery, they nicked a vein, which bled. He reports they told him a [facility] that scar tissue had formed at that site, and the blood was flowing backwards. He reports they did not seem concerned about it, but he thinks this is related to his dizziness and wants further evaluation. He does have a history of hypothyroidism, labs last checked 09/2015.  Review of Systems      Past Medical History:  Diagnosis Date  . Alcohol use   . Arthritis   . CPDD (calcium pyrophosphate deposition disease) 10/2013   R hand xray, L knee xray  . GERD (gastroesophageal reflux disease)   . Gout   . History of smoking   . HTN (hypertension)   . Hypertension   . Hypothyroidism (acquired)   . Insomnia   . Throat cancer ([place])    Dr. Caryl Pina [facility])  . TOBACCO ABUSE, HX OF 02/05/2009   Quit 2011      Current Outpatient Prescriptions  Medication Sig Dispense Refill  . amitriptyline  (ELAVIL) 50 MG tablet TAKE ONE TABLET BY MOUTH AT BEDTIME 30 tablet 11  . amLODipine (NORVASC) 10 MG tablet TAKE ONE TABLET BY MOUTH ONCE DAILY 30 tablet 0  . aspirin 325 MG tablet Take 325 mg by mouth daily.    . Colchicine 0.6 MG CAPS TAKE ONE CAPSULE BY MOUTH IN THE MORNING FOR  ONE  WEEK  THEN  AS  NEEDED 30 capsule 3  . fenofibrate 160 MG tablet Take 1 tablet (160 mg total) by mouth daily. 30 tablet 11  . fish oil-omega-3 fatty acids 1000 MG capsule Take 1 g by mouth daily.      Marland Kitchen GARLIC PO Take by mouth daily.    Marland Kitchen HYDROcodone-acetaminophen (NORCO) 5-325 MG tablet 1-2 tabs po q6 hours prn pain 30 tablet 0  . levothyroxine (SYNTHROID, LEVOTHROID) 150 MCG tablet TAKE ONE TABLET BY MOUTH ONCE DAILY 30 tablet 11  . meclizine (ANTIVERT) 12.5 MG tablet Take 1 tablet (12.5 mg total) by mouth 3 (three) times daily as needed for dizziness or nausea. 30 tablet 1  . metoprolol (LOPRESSOR) 100 MG tablet TAKE ONE TABLET BY MOUTH TWICE DAILY 60 tablet 11  . omeprazole (PRILOSEC) 20 MG capsule Take 20 mg by mouth daily.     No current facility-administered medications for this visit.     No Known Allergies  Family  History  Problem Relation Age of Onset  . Coronary artery disease Father     MI  . Hypertension Father     Social History   Social History  . Marital status: Single    Spouse name: N/A  . Number of children: 0  . Years of education: N/A   Occupational History  . Self employed     Community education officer   Social History Main Topics  . Smoking status: Former Smoker    Packs/day: 0.50    Years: 15.00    Types: Cigarettes    Quit date: 08/03/2008  . Smokeless tobacco: Never Used  . Alcohol use 3.6 oz/week    6 Cans of beer per week     Comment: Beer-every other day or so  . Drug use: No  . Sexual activity: Not on file   Other Topics Concern  . Not on file   Social History Narrative   Lives alone - split from wife. 1 dog   Occ: [employer]   Activity: stays active at work,  started weight lifting   Diet: good water, some fruits/vegetables      Constitutional: Denies fever, malaise, fatigue, headache or abrupt weight changes.  HEENT: Denies eye pain, eye redness, ear pain, ringing in the ears, wax buildup, runny nose, nasal congestion, bloody nose, or sore throat. Respiratory: Denies difficulty breathing, shortness of breath, cough or sputum production.   Cardiovascular: Denies chest pain, chest tightness, palpitations or swelling in the hands or feet.  Musculoskeletal: Pt reports difficulty with gait. Denies decrease in range of motion, difficulty with gait, muscle pain or joint pain and swelling.  Skin: Denies redness, rashes, lesions or ulcercations.  Neurological: Pt reports dizziness and problems with balance. Denies difficulty with memory, difficulty with speech or problems and coordination.    No other specific complaints in a complete review of systems (except as listed in HPI above).  Objective:   Physical Exam   BP 124/84   Pulse 81   Temp 98 F (36.7 C) (Oral)   Wt 203 lb (92.1 kg)   SpO2 99%   BMI 27.53 kg/m  Wt Readings from Last 3 Encounters:  07/14/16 203 lb (92.1 kg)  07/11/16 200 lb (90.7 kg)  05/26/16 200 lb (90.7 kg)    General: Appears his stated age, well developed, well nourished in NAD.  HEENT: Head: normal shape and size; Eyes: sclera white, no icterus, conjunctiva pink, PERRLA and EOMs intact; Ears: bilateral cerumen impaction; Neck:  Neck supple, trachea midline. No masses, lumps or thyromegaly present.  Cardiovascular: Normal rate and rhythm. S1,S2 noted.  No murmur, rubs or gallops noted. No JVD or BLE edema. No carotid bruits noted. Pulmonary/Chest: Normal effort and positive vesicular breath sounds. No respiratory distress. No wheezes, rales or ronchi noted.  Musculoskeletal: Ataxic gait. He can not walk heel to toe. He can walk on toes and heels, just unsteady. Neurological: Alert and oriented. Cranial nerves II-XII  grossly intact. Coordination normal. Negative Romberg, negative pronator drift.   BMET    Component Value Date/Time   NA 135 07/11/2016 0928   NA 137 03/11/2014 1025   K 3.7 07/11/2016 0928   K 4.0 03/11/2014 1025   CL 101 07/11/2016 0928   CL 104 03/11/2014 1025   CO2 26 07/11/2016 0928   CO2 25 03/11/2014 1025   GLUCOSE 120 (H) 07/11/2016 0928   GLUCOSE 132 (H) 03/11/2014 1025   BUN 13 07/11/2016 0928   BUN 15 03/11/2014 1025  CREATININE 0.93 07/11/2016 0928   CREATININE 1.02 08/10/2014 1644   CALCIUM 9.4 07/11/2016 0928   CALCIUM 8.6 03/11/2014 1025   GFRNONAA >60 07/11/2016 0928   GFRNONAA >60 03/11/2014 1025   GFRAA >60 07/11/2016 0928   GFRAA >60 03/11/2014 1025    Lipid Panel     Component Value Date/Time   CHOL 203 (H) 10/11/2015 0833   CHOL 231 (H) 03/16/2014 0809   TRIG (H) 10/11/2015 0833    643.0 Triglyceride is over 400; calculations on Lipids are invalid.   HDL 33.50 (L) 10/11/2015 0833   HDL 40 03/16/2014 0809   CHOLHDL 6 10/11/2015 0833   LDLCALC 123 (H) 03/16/2014 0809    CBC    Component Value Date/Time   WBC 6.0 07/11/2016 0928   RBC 4.68 07/11/2016 0928   HGB 15.4 07/11/2016 0928   HGB 14.8 03/11/2014 1025   HCT 43.7 07/11/2016 0928   HCT 44.0 03/11/2014 1025   PLT 256 07/11/2016 0928   PLT 200 03/11/2014 1025   MCV 93.4 07/11/2016 0928   MCV 98 03/11/2014 1025   MCH 33.0 07/11/2016 0928   MCHC 35.3 07/11/2016 0928   RDW 12.3 07/11/2016 0928   RDW 12.2 03/11/2014 1025   LYMPHSABS 2.1 08/10/2014 1644   MONOABS 0.6 08/10/2014 1644   EOSABS 0.2 08/10/2014 1644   BASOSABS 0.1 08/10/2014 1644    Hgb A1C No results found for: HGBA1C         Assessment & Plan:   ER followup for vertigo:  ER notes, labs and imaging reviewed Meclizine no help Orthostatics normal Will check TSH, T4 today Referral placed to neurology for further evaluation  Cerumen impaction, bilateral:  Manual lavage by CMA Advised him to try Debrox OTC  to see if that helps  Will follow up after labs, to ER if worse RTC as needed  Webb Silversmith, NP

## 2016-07-17 ENCOUNTER — Other Ambulatory Visit: Payer: Self-pay | Admitting: Family Medicine

## 2016-07-17 ENCOUNTER — Encounter: Payer: Self-pay | Admitting: Neurology

## 2016-07-17 ENCOUNTER — Ambulatory Visit (INDEPENDENT_AMBULATORY_CARE_PROVIDER_SITE_OTHER): Payer: 59 | Admitting: Neurology

## 2016-07-17 DIAGNOSIS — R269 Unspecified abnormalities of gait and mobility: Secondary | ICD-10-CM

## 2016-07-17 DIAGNOSIS — I639 Cerebral infarction, unspecified: Secondary | ICD-10-CM

## 2016-07-17 DIAGNOSIS — Z8673 Personal history of transient ischemic attack (TIA), and cerebral infarction without residual deficits: Secondary | ICD-10-CM | POA: Insufficient documentation

## 2016-07-17 HISTORY — DX: Unspecified abnormalities of gait and mobility: R26.9

## 2016-07-17 HISTORY — DX: Cerebral infarction, unspecified: I63.9

## 2016-07-17 NOTE — Patient Instructions (Addendum)
Fall Prevention in the Home Falls can cause injuries and can affect people from all age groups. There are many simple things that you can do to make your home safe and to help prevent falls. What can I do on the outside of my home?  Regularly repair the edges of walkways and driveways and fix any cracks.  Remove high doorway thresholds.  Trim any shrubbery on the main path into your home.  Use bright outdoor lighting.  Clear walkways of debris and clutter, including tools and rocks.  Regularly check that handrails are securely fastened and in good repair. Both sides of any steps should have handrails.  Install guardrails along the edges of any raised decks or porches.  Have leaves, snow, and ice cleared regularly.  Use sand or salt on walkways during winter months.  In the garage, clean up any spills right away, including grease or oil spills. What can I do in the bathroom?  Use night lights.  Install grab bars by the toilet and in the tub and shower. Do not use towel bars as grab bars.  Use non-skid mats or decals on the floor of the tub or shower.  If you need to sit down while you are in the shower, use a plastic, non-slip stool.  Keep the floor dry. Immediately clean up any water that spills on the floor.  Remove soap buildup in the tub or shower on a regular basis.  Attach bath mats securely with double-sided non-slip rug tape.  Remove throw rugs and other tripping hazards from the floor. What can I do in the bedroom?  Use night lights.  Make sure that a bedside light is easy to reach.  Do not use oversized bedding that drapes onto the floor.  Have a firm chair that has side arms to use for getting dressed.  Remove throw rugs and other tripping hazards from the floor. What can I do in the kitchen?  Clean up any spills right away.  Avoid walking on wet floors.  Place frequently used items in easy-to-reach places.  If you need to reach for something above  you, use a sturdy step stool that has a grab bar.  Keep electrical cables out of the way.  Do not use floor polish or wax that makes floors slippery. If you have to use wax, make sure that it is non-skid floor wax.  Remove throw rugs and other tripping hazards from the floor. What can I do in the stairways?  Do not leave any items on the stairs.  Make sure that there are handrails on both sides of the stairs. Fix handrails that are broken or loose. Make sure that handrails are as long as the stairways.  Check any carpeting to make sure that it is firmly attached to the stairs. Fix any carpet that is loose or worn.  Avoid having throw rugs at the top or bottom of stairways, or secure the rugs with carpet tape to prevent them from moving.  Make sure that you have a light switch at the top of the stairs and the bottom of the stairs. If you do not have them, have them installed. What are some other fall prevention tips?  Wear closed-toe shoes that fit well and support your feet. Wear shoes that have rubber soles or low heels.  When you use a stepladder, make sure that it is completely opened and that the sides are firmly locked. Have someone hold the ladder while you are using   it. Do not climb a closed stepladder.  Add color or contrast paint or tape to grab bars and handrails in your home. Place contrasting color strips on the first and last steps.  Use mobility aids as needed, such as canes, walkers, scooters, and crutches.  Turn on lights if it is dark. Replace any light bulbs that burn out.  Set up furniture so that there are clear paths. Keep the furniture in the same spot.  Fix any uneven floor surfaces.  Choose a carpet design that does not hide the edge of steps of a stairway.  Be aware of any and all pets.  Review your medicines with your healthcare provider. Some medicines can cause dizziness or changes in blood pressure, which increase your risk of falling. Talk with  your health care provider about other ways that you can decrease your risk of falls. This may include working with a physical therapist or trainer to improve your strength, balance, and endurance. This information is not intended to replace advice given to you by your health care provider. Make sure you discuss any questions you have with your health care provider. Document Released: 07/10/2002 Document Revised: 12/17/2015 Document Reviewed: 08/24/2014 Elsevier Interactive Patient Education  2017 Elsevier Inc.  

## 2016-07-17 NOTE — Progress Notes (Signed)
Reason for visit: Gait ataxia  Referring physician: Dr. Honor Junes R Carmello Twing. is a 55 y.o. male  History of present illness:  Patrick Bailey is a 55 year old white male with a history of onset of gait instability that occurred suddenly 7 days prior to this evaluation. The patient was paying bills, and he suddenly noted that he was not able to walk straight, he was staggering, usually going to the right side as if something was pulling him to the right. The patient also noted onset of a mild occipital headache that began around same time. The patient denied any numbness or weakness of the extremities, he has had some tingling in the feet but this predates the onset of these symptoms. The patient has not actually fallen but he is having a lot of difficulty staying upright. When he sits down or lies down the symptoms of sensation of instability go away completely. He denies a slurred speech or difficulty with swallowing. The patient has gone for medical evaluation and MRI of the brain shows mild small vessel disease that is chronic, no acute changes were seen. 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 The patient is now on low-dose aspirin. He had not been on aspirin previously. He is sent to this office for further evaluation.  Past Medical History:  Diagnosis Date  . Abnormality of gait 07/17/2016  . Alcohol use   . Arthritis   . CPDD (calcium pyrophosphate deposition disease) 10/2013   R hand xray, L knee xray  . GERD (gastroesophageal reflux disease)   . Gout   . History of smoking   . HTN (hypertension)   . Hypertension   . Hypothyroidism (acquired)   . Insomnia   . Throat cancer ([place])    Dr. Caryl Pina [facility])  . TOBACCO ABUSE, HX OF 02/05/2009   Quit 2011      Past Surgical History:  Procedure Laterality Date  . MASS EXCISION  Right 03/19/2016   EXCISION LIPOMA RIGHT WRIST;  Surgeon: Leanora Cover, MD  . NECK SURGERY  2010   oral cancer excision - [place] ([facility])  . SKIN LESION EXCISION  08/2010   Forehead pyogenic granuloma [name]    Family History  Problem Relation Age of Onset  . Coronary artery disease Father     MI  . Hypertension Father   . Stroke Father   . Cancer Mother   . Cancer Brother     Brain    Social history:  reports that he quit smoking about 7 years ago. His smoking use included Cigarettes. He has a 7.50 pack-year smoking history. He has never used smokeless tobacco. He reports that he drinks about 3.6 oz of alcohol per week . He reports that he does not use drugs.  Medications:  Prior to Admission medications   Medication Sig Start Date End Date Taking? Authorizing Provider  amitriptyline (ELAVIL) 50 MG tablet TAKE ONE TABLET BY MOUTH AT BEDTIME 01/10/16  Yes Ria Bush, MD  amLODipine (NORVASC) 10 MG tablet TAKE ONE TABLET BY MOUTH ONCE DAILY 06/16/16  Yes Ria Bush, MD  aspirin 325 MG tablet Take 325 mg by mouth daily.   Yes Historical Provider, MD  fenofibrate 160 MG tablet Take 1 tablet (160 mg total) by mouth daily. 10/23/15  Yes Ria Bush, MD  fish oil-omega-3  fatty acids 1000 MG capsule Take 1 g by mouth daily.     Yes Historical Provider, MD  GARLIC PO Take by mouth daily.   Yes Historical Provider, MD  levothyroxine (SYNTHROID, LEVOTHROID) 150 MCG tablet TAKE ONE TABLET BY MOUTH ONCE DAILY 08/22/15  Yes Ria Bush, MD  meclizine (ANTIVERT) 12.5 MG tablet Take 1 tablet (12.5 mg total) by mouth 3 (three) times daily as needed for dizziness or nausea. 07/11/16  Yes Delman Kitten, MD  metoprolol (LOPRESSOR) 100 MG tablet TAKE ONE TABLET BY MOUTH TWICE DAILY 06/17/15  Yes Ria Bush, MD  omeprazole (PRILOSEC) 20 MG capsule Take 20 mg by mouth daily.   Yes Historical Provider, MD     No Known Allergies  ROS:  Out of a complete 14 system review of  symptoms, the patient complains only of the following symptoms, and all other reviewed systems are negative.  Gait instability  Blood pressure (!) 148/95, pulse 71, height 6' (1.829 m), weight 201 lb 12 oz (91.5 kg).  Physical Exam  General: The patient is alert and cooperative at the time of the examination.  Eyes: Pupils are equal, round, and reactive to light. Discs are flat bilaterally.  Neck: The neck is supple, no carotid bruits are noted.  Respiratory: The respiratory examination is clear.  Cardiovascular: The cardiovascular examination reveals a regular rate and rhythm, no obvious murmurs or rubs are noted.  Skin: Extremities are without significant edema.  Neurologic Exam  Mental status: The patient is alert and oriented x 3 at the time of the examination. The patient has apparent normal recent and remote memory, with an apparently normal attention span and concentration ability.  Cranial nerves: Facial symmetry is present. There is good sensation of the face to pinprick and soft touch bilaterally. The strength of the facial muscles and the muscles to head turning and shoulder shrug are normal bilaterally. Speech is well enunciated, no aphasia or dysarthria is noted. Extraocular movements are full. Visual fields are full. The tongue is midline, and the patient has symmetric elevation of the soft palate. No obvious hearing deficits are noted.  Motor: The motor testing reveals 5 over 5 strength of all 4 extremities. Good symmetric motor tone is noted throughout.  Sensory: Sensory testing is intact to pinprick, soft touch, vibration sensation, and position sense on all 4 extremities. No evidence of extinction is noted.  Coordination: Cerebellar testing reveals good finger-nose-finger and heel-to-shin bilaterally.  Gait and station: Gait is slightly wide-based, tandem gait is unsteady. Romberg is negative, but is unsteady, the patient will tend to lean to the right. No drift is  seen.  Reflexes: Deep tendon reflexes are symmetric and normal bilaterally. Toes are downgoing bilaterally.   Assessment/Plan:  1. 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 MRI of the brain may miss a small brain stem event. The patient has already gained some improvement with his ability to ambulate, he will likely do well in the future. The patient is to remain on aspirin, he will be set up for physical therapy for gait and safety training. He will follow-up through this office in about 6 weeks.  Jill Alexanders MD 07/17/2016 10:55 AM  [facility] [address] [place], [place] [address]  Phone [phone] Fax [phone]

## 2016-07-18 ENCOUNTER — Encounter: Payer: Self-pay | Admitting: Family Medicine

## 2016-07-20 LAB — VITAMIN B12: Vitamin B-12: 432 pg/mL (ref 232–1245)

## 2016-07-20 LAB — RPR: RPR: NONREACTIVE

## 2016-07-20 LAB — SEDIMENTATION RATE: Sed Rate: 2 mm/hr (ref 0–30)

## 2016-07-20 LAB — ANA W/REFLEX: Anti Nuclear Antibody(ANA): NEGATIVE

## 2016-07-20 LAB — B. BURGDORFI ANTIBODIES: Lyme IgG/IgM Ab: <0.91 {ISR} (ref 0.00–0.90)

## 2016-07-21 ENCOUNTER — Telehealth: Payer: Self-pay

## 2016-07-21 NOTE — Telephone Encounter (Signed)
-----   Message from Kathrynn Ducking, MD sent at 07/20/2016  5:24 PM EST -----   The blood work results are unremarkable. Please call the patient.  ----- Message ----- From: Lavone Neri Lab Results In Sent: 07/18/2016   7:41 AM To: Kathrynn Ducking, MD

## 2016-07-21 NOTE — Telephone Encounter (Signed)
Called w/ unremarkable lab results. Verbalized understanding and appreciation for call. 

## 2016-07-22 ENCOUNTER — Ambulatory Visit: Payer: 59 | Attending: Neurology | Admitting: Physical Therapy

## 2016-07-22 DIAGNOSIS — R42 Dizziness and giddiness: Secondary | ICD-10-CM

## 2016-07-22 DIAGNOSIS — R2689 Other abnormalities of gait and mobility: Secondary | ICD-10-CM

## 2016-07-22 DIAGNOSIS — R2681 Unsteadiness on feet: Secondary | ICD-10-CM

## 2016-07-22 NOTE — Therapy (Signed)
[facility] [address] [place], [place], [address] Phone: [phone]   Fax:  [phone]  Physical Therapy Evaluation  Patient Details  Name: Patrick Bailey. MRN: [id number] Date of Birth: September 06, 1960 Referring Provider: [name]  Encounter Date: 07/22/2016      PT End of Session - 07/22/16 0916    Visit Number 1   Number of Visits 14   Date for PT Re-Evaluation 09/21/16   Authorization Type UHC   PT Start Time 0803   PT Stop Time 0900   PT Time Calculation (min) 57 min   Activity Tolerance Patient tolerated treatment well   Behavior During Therapy [facility] for tasks assessed/performed      Past Medical History:  Diagnosis Date  . Abnormality of gait 07/17/2016  . Alcohol use   . Arthritis   . Cerebellar stroke ([place]) 07/17/2016   Presumed, causing gait abnormality s/p eval by neuro [name]) 07/2016 overall improving.   Marland Kitchen CPDD (calcium pyrophosphate deposition disease) 10/2013   R hand xray, L knee xray  . GERD (gastroesophageal reflux disease)   . Gout   . History of smoking   . HTN (hypertension)   . Hypertension   . Hypothyroidism (acquired)   . Insomnia   . Throat cancer ([place])    Dr. Caryl Pina [facility])  . TOBACCO ABUSE, HX OF 02/05/2009   Quit 2011      Past Surgical History:  Procedure Laterality Date  . MASS EXCISION Right 03/19/2016   EXCISION LIPOMA RIGHT WRIST;  Surgeon: Leanora Cover, MD  . NECK SURGERY  2010   oral cancer excision - [facility] ([facility])  . SKIN LESION EXCISION  08/2010   Forehead pyogenic granuloma [name]    There were no vitals filed for this visit.       Subjective Assessment - 07/22/16 0806    Subjective Pt reports he had a mini stroke on 07/10/16 "in back of neck".  When it happened, he was trying to get out of car and noticed staggering with walking.  Pt feels it is improving.  Pt has not fallen, though he reports "catching himself."  Pt describes sensation like floating.   Pertinent History History of cerebellar CVA, HTN   Patient Stated Goals Pt's goal for therapy is to go back to work.     Currently in Pain? No/denies     BP beginning of session 148/97, end of session 150/107 (pt reports not taking BP meds today, will take immediately upon return home).  No reports of symptoms associated with BP readings.  Educated in CVA and HTN warning signs and symptoms.      [facility] PT Assessment - 07/22/16 0810      Assessment   Medical Diagnosis abnormality of gait, cerebellar CVA   Referring Provider [name]   Onset Date/Surgical Date 07/10/16     Precautions   Precautions Fall     Balance Screen   Has the patient fallen in the past 6 months No   Has the patient had a decrease in activity level because of a fear of falling?  No   Is the patient reluctant to leave their home because of a fear of falling?  No     Home Environment   Living Environment Private residence   Living Arrangements Alone   Type of [place] to enter   Entrance Stairs-Number of Steps 2   Entrance Stairs-Rails Can reach both;Right;Left   Home  Layout One level     Prior Function   Level of Independence Independent   Vocation Full time employment  pipe fitter, Optometrist climbing, welding, tunnels, ladders  heavy lifting     Observation/Other Assessments   Focus on Therapeutic Outcomes (FOTO)  Started, but not completed     Posture/Postural Control   Posture/Postural Control Postural limitations   Postural Limitations Rounded Shoulders     ROM / Strength   AROM / PROM / Strength Strength     Strength   Overall Strength Within functional limits for tasks performed     Transfers   Transfers Sit to Stand;Stand to Sit   Sit to Stand 7: Independent   Stand to Sit 7: Independent     Ambulation/Gait   Ambulation/Gait Yes   Ambulation/Gait Assistance 5: Supervision   Ambulation/Gait Assistance Details Veers to the  right with gait, slowed gait with environmental scanning   Ambulation Distance (Feet) 300 Feet   Assistive device None   Gait Pattern Step-through pattern;Wide base of support  cross-over steps at times   Ambulation Surface Level;Indoor   Gait velocity 12.53= 2.62 ft/sec   Stairs Yes   Stairs Assistance 5: Supervision   Stairs Assistance Details (indicate cue type and reason) increase trunk lean, increase c/o unsteadiness with stair negotiation no rail   Stair Management Technique No rails;Alternating pattern   Number of Stairs 4   Height of Stairs 6     Standardized Balance Assessment   Standardized Balance Assessment Timed Up and Go Test     Timed Up and Go Test   Normal TUG (seconds) 13.78   TUG Comments Scores >13.5 seconds indicate increased fall risk.     Functional Gait  Assessment   Gait assessed  Yes   Gait Level Surface Walks 20 ft in less than 7 sec but greater than 5.5 sec, uses assistive device, slower speed, mild gait deviations, or deviates 6-10 in outside of the 12 in walkway width.   Change in Gait Speed Makes only minor adjustments to walking speed, or accomplishes a change in speed with significant gait deviations, deviates 10-15 in outside the 12 in walkway width, or changes speed but loses balance but is able to recover and continue walking.   Gait with Horizontal Head Turns Performs head turns with moderate changes in gait velocity, slows down, deviates 10-15 in outside 12 in walkway width but recovers, can continue to walk.   Gait with Vertical Head Turns Performs task with moderate change in gait velocity, slows down, deviates 10-15 in outside 12 in walkway width but recovers, can continue to walk.   Gait and Pivot Turn Pivot turns safely within 3 sec and stops quickly with no loss of balance.   Step Over Obstacle Is able to step over one shoe box (4.5 in total height) but must slow down and adjust steps to clear box safely. May require verbal cueing.   Gait with  Narrow Base of Support Ambulates less than 4 steps heel to toe or cannot perform without assistance.   Gait with Eyes Closed Cannot walk 20 ft without assistance, severe gait deviations or imbalance, deviates greater than 15 in outside 12 in walkway width or will not attempt task.   Ambulating Backwards Walks 20 ft, uses assistive device, slower speed, mild gait deviations, deviates 6-10 in outside 12 in walkway width.  11.14 sec   Steps Alternating feet, no rail.  "c/o being swimmy-headed"   Total  Score 14   FGA comment: Scores <22/30 indicate increased fall risk.            Vestibular Assessment - 07/22/16 0827      Vestibular Assessment   General Observation Baseline rating of dizziness 3/10     Symptom Behavior   Type of Dizziness Comment  "swimmy-headed", like floating   Frequency of Dizziness c/o increased sensation when walking   Duration of Dizziness while walking; goes away within several minutes upon sititng   Aggravating Factors Turning head quickly;Turning body quickly;Supine to sit  Going up steps   Relieving Factors Comments  Not moving     Occulomotor Exam   Smooth Pursuits Intact     Vestibulo-Occular Reflex   VOR 1 Head Only (x 1 viewing) rates increase in dizziness to 5-6/10      Visual Acuity   Static Line 7 on Eye Chart   Dynamic unable to perform with head movements; once head movements stopped, pt reads line 4     Positional Testing   Dix-Hallpike Dix-Hallpike Right;Dix-Hallpike Left   Horizontal Canal Testing Horizontal Canal Right;Horizontal Canal Left     Dix-Hallpike Right   Dix-Hallpike Right Duration None     Dix-Hallpike Left   Dix-Hallpike Left Duration 3-5 sec   Dix-Hallpike Left Symptoms No nystagmus  c/o spinning with return to sitting     Horizontal Canal Right   Horizontal Canal Right Duration none     Horizontal Canal Left   Horizontal Canal Left Duration none               [facility] Adult PT Treatment/Exercise -  07/22/16 0810      Standardized Balance Assessment   Standardized Balance Assessment Timed Up and Go Test     Timed Up and Go Test   TUG Normal TUG   Normal TUG (seconds) 13.78         Vestibular Treatment/Exercise - 07/22/16 0001      Vestibular Treatment/Exercise   Vestibular Treatment Provided Canalith Repositioning   Canalith Repositioning Epley Manuever Left      EPLEY MANUEVER LEFT   Number of Reps  1               PT Education - 07/22/16 0914    Education provided Yes   Education Details Provided BPPV handout and discussed BPPV versus central vertigo; BP measurements taken, with education provided on warning signs and symptoms   Person(s) Educated Patient   Methods Explanation   Comprehension Verbalized understanding          PT Short Term Goals - 07/22/16 [id number]      PT SHORT TERM GOAL #1   Title Pt will be independent with HEP for improved balance, functional mobility and gait.  TARGET 08/21/16   Time 4   Period Weeks   Status New     PT SHORT TERM GOAL #2   Title Pt will improve TUG to less than or equal to 13 seconds for decreased fall risk.   Time 4   Period Weeks   Status New     PT SHORT TERM GOAL #3   Title PT will rate dizziness increase less than 2 points during functional activities and gait.   Time 4   Period Weeks   Status New     PT SHORT TERM GOAL #4   Title Pt will verbalize understanding of CVA warning signs and risk factors.   Time 4   Period Weeks  Status New           PT Long Term Goals - 07/22/16 [id number]      PT LONG TERM GOAL #1   Title Pt will verbalize understanding of fall prevention in home environment.  TARGET 09/21/16   Time 7   Period Weeks   Status New     PT LONG TERM GOAL #2   Title Pt will improve Functional Gait Assessment to at least 20/30 for decreased fall risk, improved funcitonal mobility.   Time 7   Period Weeks   Status New     PT LONG TERM GOAL #3   Title Pt will improve gait velocity to  at least 3 ft/sec for improved gait efficiency and safety.   Time 7   Period Weeks   Status New     PT LONG TERM GOAL #4   Title Pt will negotiate 4 steps, no rail, while carrying objects, no LOB, independently for improved stair negotiation in preparation for return to work.   Time 7   Period Weeks   Status New               Plan - 07/22/16 0916    Clinical Impression Statement Pt is a 55 year old male who presents to OP PT with recent history of likely cerebellar CVA.  Pt experienced dizziness, LOB, and difficulty with gait on 07/10/16, with feelings of gait unsteadiness and dizziness continuing.  Positional testing for BPPV performed based on patient's symptoms, with symptoms quickly resolved; however, pt continues to c/o baseline dizziness ("swimmy headed, lightheaded") as 3/10.  In addition to dizziness c/o, pt demonstrates decreased balance, decreased safety and steadiness with gait, decreased ability for independent obstacle and stair negotiation.  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   Rehab Potential Good   PT Frequency 2x / week  1x/wk for 1 week, then   PT Duration 6 weeks   PT Treatment/Interventions ADLs/Self Care Home Management;Canalith Repostioning;Functional mobility training;Stair training;Gait training;Therapeutic activities;Therapeutic exercise;Balance training;Neuromuscular re-education;Patient/family education;Vestibular   PT Next Visit Plan Assess dizziness-BPPV versus central; initiate HEP for balance, vestibular exercises   Consulted and Agree with Plan of Care Patient      Patient will benefit from skilled therapeutic intervention in order to improve the following deficits and impairments:  Abnormal gait, Decreased  activity tolerance, Decreased balance, Decreased mobility, Dizziness, Difficulty walking  Visit Diagnosis: Other abnormalities of gait and mobility  Unsteadiness on feet  Dizziness and giddiness     Problem List Patient Active Problem List   Diagnosis Date Noted  . Cerebellar stroke ([place]) 07/17/2016  . Health maintenance examination 06/17/2015  . Mass of right wrist 06/17/2015  . Hypertriglyceridemia 06/17/2015  . Hypothyroidism (acquired)   . Swelling of left knee joint 10/19/2014  . Calcium pyrophosphate crystal disease 10/19/2014  . Chest pain 03/12/2014  . Olecranon bursitis of right elbow 05/27/2012  . Essential hypertension 08/13/2010  . Alcohol use ([place]) 02/05/2009  . DEPRESSION, SITUATIONAL 02/05/2009  . TOBACCO ABUSE, HX OF 02/05/2009  . HSV 07/24/2008  . ANXIETY 07/24/2008  . INSOMNIA 07/24/2008  . THROAT CANCER 05/30/2008  . SHOULDER PAIN, RIGHT 12/20/2006    Concetta Guion W. 07/22/2016, 9:30 AM [place] [address] [facility] [address] [place],  [place], [address] Phone: [phone]   Fax:  [phone]  Name: Patrick Bailey. MRN: [id number] Date of Birth: 1961-07-21

## 2016-07-24 ENCOUNTER — Ambulatory Visit: Payer: 59

## 2016-07-24 VITALS — BP 134/93 | HR 75

## 2016-07-24 DIAGNOSIS — R2689 Other abnormalities of gait and mobility: Secondary | ICD-10-CM

## 2016-07-24 DIAGNOSIS — R42 Dizziness and giddiness: Secondary | ICD-10-CM

## 2016-07-24 DIAGNOSIS — R2681 Unsteadiness on feet: Secondary | ICD-10-CM

## 2016-07-24 NOTE — Patient Instructions (Addendum)
Perform with chair in front of you, while standing in a corner.  Feet Together, Head Motion - Eyes Open    With eyes open, feet together, move head slowly: up and down 5 times and side to side 5 times. Repeat __3__ times per session. Do __1__ sessions per day.  Copyright  VHI. All rights reserved.  Feet Together, Varied Arm Positions - Eyes Closed    Stand with feet together and arms out. Close eyes and visualize upright position. Hold _10___ seconds. Repeat __3__ times per session. Do _1___ sessions per day.  Copyright  VHI. All rights reserved.

## 2016-07-24 NOTE — Therapy (Signed)
[facility] [address] [place], [place], [address] Phone: [phone]   Fax:  [phone]  Physical Therapy Treatment  Patient Details  Name: Patrick Bailey. MRN: [id number] Date of Birth: 17-May-1961 Referring Provider: [name]  Encounter Date: 07/24/2016      PT End of Session - 07/24/16 1138    Visit Number 2   Number of Visits 14   Date for PT Re-Evaluation 09/21/16   Authorization Type UHC   PT Start Time 0848   PT Stop Time 0931   PT Time Calculation (min) 43 min   Equipment Utilized During Treatment --  SOT harness   Activity Tolerance Other (comment)  pt required rest breaks 2/2 dizziness and nausea   Behavior During Therapy WFL for tasks assessed/performed      Past Medical History:  Diagnosis Date  . Abnormality of gait 07/17/2016  . Alcohol use   . Arthritis   . Cerebellar stroke ([place]) 07/17/2016   Presumed, causing gait abnormality s/p eval by neuro [name]) 07/2016 overall improving.   Marland Kitchen CPDD (calcium pyrophosphate deposition disease) 10/2013   R hand xray, L knee xray  . GERD (gastroesophageal reflux disease)   . Gout   . History of smoking   . HTN (hypertension)   . Hypertension   . Hypothyroidism (acquired)   . Insomnia   . Throat cancer ([place])    Dr. Caryl Pina [facility])  . TOBACCO ABUSE, HX OF 02/05/2009   Quit 2011      Past Surgical History:  Procedure Laterality Date  . MASS EXCISION Right 03/19/2016   EXCISION LIPOMA RIGHT WRIST;  Surgeon: Leanora Cover, MD  . NECK SURGERY  2010   oral cancer excision - [facility] (OMFS)  . SKIN LESION EXCISION  08/2010   Forehead pyogenic granuloma [name]    Vitals:   07/24/16 0920 07/24/16 0923  BP: (!) 141/98 (!) 134/93  Pulse: 76 75        Subjective Assessment - 07/24/16 0851    Subjective Pt denied falls or changes since last visit. "I catch myself so I don't fall, I still feel swimmyheaded". Pt reported he took BP meds this morning.  The dizziness is still the same.   Pertinent History History of cerebellar CVA, HTN   Patient Stated Goals Pt's goal for therapy is to go back to work.     Currently in Pain? No/denies            Neuro re-ed: Neuro re-ed: sensory organization test performed with following results: Conditions: 1:  1 trial below normal, 2 trial WNL 2:  3 trials below normal 3:  3 trials below normal 4: 3 trials below normal 5: 3 "fall" trials 6: 2 trials WNL and one below Composite score:  43 Sensory Analysis Som: ~78, below normal Vis:~55, below normal Vest:~1, below normal Pref: WNL Strategy analysis:      More ankle dominant during LOB. COG alignment:      R bias and anterior direction  Pt performed balance HEP, see pt instructions for details. Performed in corner with chair and S for safety: feet apart and together with eyes open/closed and head turns.  Cues for technique.                       PT Education - 07/24/16 1137    Education provided Yes   Education Details PT discussed SOT results and provided pt with balance HEP. PT educated pt  that lightheadedness/dizziness is likely related to CVA vs. BPPV based on sx's. PT reiterated the importance of pt assessing BP at rest and during activity at home and that it should be approx. 120/80 and to notify MD if BP is elevated or go to hospital if BP is elevated and he has CVA sx's.    Person(s) Educated Patient   Methods Explanation;Demonstration;Verbal cues;Handout   Comprehension Verbalized understanding;Returned demonstration          PT Short Term Goals - 07/22/16 [id number]      PT SHORT TERM GOAL #1   Title Pt will be independent with HEP for improved balance, functional mobility and gait.  TARGET 08/21/16   Time 4   Period Weeks   Status New     PT SHORT TERM GOAL #2   Title Pt will improve TUG to less than or equal to 13 seconds for decreased fall risk.   Time 4   Period Weeks   Status New     PT SHORT  TERM GOAL #3   Title PT will rate dizziness increase less than 2 points during functional activities and gait.   Time 4   Period Weeks   Status New     PT SHORT TERM GOAL #4   Title Pt will verbalize understanding of CVA warning signs and risk factors.   Time 4   Period Weeks   Status New           PT Long Term Goals - 07/24/16 1142      PT LONG TERM GOAL #1   Title Pt will verbalize understanding of fall prevention in home environment.  TARGET 09/21/16   Time 7   Period Weeks   Status New     PT LONG TERM GOAL #2   Title Pt will improve Functional Gait Assessment to at least 20/30 for decreased fall risk, improved funcitonal mobility.   Time 7   Period Weeks   Status New     PT LONG TERM GOAL #3   Title Pt will improve gait velocity to at least 3 ft/sec for improved gait efficiency and safety.   Time 7   Period Weeks   Status New     PT LONG TERM GOAL #4   Title Pt will negotiate 4 steps, no rail, while carrying objects, no LOB, independently for improved stair negotiation in preparation for return to work.   Time 7   Period Weeks   Status New     PT LONG TERM GOAL #5   Title Pt will improve SOT score to >/=70 (or WNL for his 97-27 year old age group) to improve balance and safety.   Status New               Plan - 07/24/16 1139    Clinical Impression Statement Pt's SOT composite score (43) below normal for his age group of 10-32 years old (~14). SOT results indicate pt has little vestibular input, which is likely contributing to his imbalance. SOT results also indicated pt has decr. use of vision and somatosenory systems during balance. Pt's dizziness and nausea sx's are more consistent with central nervous system disorder (recent CVA) vs. BPPV. PT will trial habituation exercises next session as tolerated. Continue with POC.     Rehab Potential Good   PT Frequency 2x / week  1x/wk for 1 week, then   PT Duration 6 weeks   PT Treatment/Interventions  ADLs/Self Care Home Management;Canalith Repostioning;Functional mobility training;Stair  training;Gait training;Therapeutic activities;Therapeutic exercise;Balance training;Neuromuscular re-education;Patient/family education;Vestibular   PT Next Visit Plan Add activities to HEP for balance and walking with head turns.   PT Home Exercise Plan Corner balance HEP   Consulted and Agree with Plan of Care Patient      Patient will benefit from skilled therapeutic intervention in order to improve the following deficits and impairments:  Abnormal gait, Decreased activity tolerance, Decreased balance, Decreased mobility, Dizziness, Difficulty walking  Visit Diagnosis: Dizziness and giddiness  Other abnormalities of gait and mobility  Unsteadiness on feet     Problem List Patient Active Problem List   Diagnosis Date Noted  . Cerebellar stroke ([place]) 07/17/2016  . Health maintenance examination 06/17/2015  . Mass of right wrist 06/17/2015  . Hypertriglyceridemia 06/17/2015  . Hypothyroidism (acquired)   . Swelling of left knee joint 10/19/2014  . Calcium pyrophosphate crystal disease 10/19/2014  . Chest pain 03/12/2014  . Olecranon bursitis of right elbow 05/27/2012  . Essential hypertension 08/13/2010  . Alcohol use ([place]) 02/05/2009  . DEPRESSION, SITUATIONAL 02/05/2009  . TOBACCO ABUSE, HX OF 02/05/2009  . HSV 07/24/2008  . ANXIETY 07/24/2008  . INSOMNIA 07/24/2008  . THROAT CANCER 05/30/2008  . SHOULDER PAIN, RIGHT 12/20/2006    Patrick Bailey L 07/24/2016, 11:44 AM  [facility] [address] [place], [place], [address] Phone: [phone]   Fax:  [phone]  Name: Patrick Bailey. MRN: [id number] Date of Birth: 1960/12/23   Geoffry Paradise, PT,DPT 07/24/16 11:47 AM Phone: [phone] Fax: [phone]

## 2016-07-29 ENCOUNTER — Telehealth: Payer: Self-pay

## 2016-07-29 DIAGNOSIS — Z79899 Other long term (current) drug therapy: Secondary | ICD-10-CM

## 2016-07-29 NOTE — Telephone Encounter (Signed)
Pt came by office; pt is out of levothyroxine 150 mcg. I spoke with Colletta Maryland at [facility] [address] and has to change manufacturer and request cb with OK to change manufacturer and does pt need sooner labs because of change. Pt request cb.

## 2016-07-29 NOTE — Telephone Encounter (Signed)
Pharmacy notified ok to start new manufacturer. Message left advising patient to call and schedule 6 week lab appt for thyroid check after starting new med.

## 2016-07-29 NOTE — Telephone Encounter (Signed)
Stephanie from [facility] called; pt is there and [facility] not heard if can change manufacturers. Colletta Maryland notified per chart note that it is OK to start new manufacturer. Colletta Maryland voiced understanding.

## 2016-07-31 ENCOUNTER — Ambulatory Visit: Payer: 59 | Admitting: Internal Medicine

## 2016-07-31 ENCOUNTER — Ambulatory Visit: Payer: 59 | Admitting: Physical Therapy

## 2016-07-31 DIAGNOSIS — R2689 Other abnormalities of gait and mobility: Secondary | ICD-10-CM | POA: Diagnosis not present

## 2016-07-31 DIAGNOSIS — R2681 Unsteadiness on feet: Secondary | ICD-10-CM

## 2016-07-31 DIAGNOSIS — R42 Dizziness and giddiness: Secondary | ICD-10-CM

## 2016-07-31 NOTE — Therapy (Signed)
[facility] [address], [place], [address] Phone: [phone]   Fax:  [phone]  Physical Therapy Treatment  Patient Details  Name: Patrick Bailey. MRN: [id number] Date of Birth: [birth year] Referring Provider: [name]  Encounter Date: 07/31/2016      PT End of Session - 07/31/16 0840    Visit Number 3   Number of Visits 14   Date for PT Re-Evaluation 09/21/16   Authorization Type UHC   PT Start Time 0800   PT Stop Time 0840   PT Time Calculation (min) 40 min   Equipment Utilized During Treatment Gait belt   Activity Tolerance Patient tolerated treatment well   Behavior During Therapy WFL for tasks assessed/performed      Past Medical History:  Diagnosis Date  . Abnormality of gait 07/17/2016  . Alcohol use   . Arthritis   . Cerebellar stroke ([place]) 07/17/2016   Presumed, causing gait abnormality s/p eval by neuro [name]) 07/2016 overall improving.   Marland Kitchen CPDD (calcium pyrophosphate deposition disease) 10/2013   R hand xray, L knee xray  . GERD (gastroesophageal reflux disease)   . Gout   . History of smoking   . HTN (hypertension)   . Hypertension   . Hypothyroidism (acquired)   . Insomnia   . Throat cancer ([place])    Dr. Caryl Pina [facility])  . TOBACCO ABUSE, HX OF 02/05/2009   Quit 2011      Past Surgical History:  Procedure Laterality Date  . MASS EXCISION Right 03/19/2016   EXCISION LIPOMA RIGHT WRIST;  Surgeon: Leanora Cover, MD  . NECK SURGERY  2010   oral cancer excision - [facility] (OM[facility])  . SKIN LESION EXCISION  08/2010   Forehead pyogenic granuloma [name]    There were no vitals filed for this visit.      Subjective Assessment - 07/31/16 0801    Subjective still having episodes of "swimmy headed" and feeling like he's going to the right.  reports symptoms just come on suddenly   Pertinent History History of cerebellar CVA, HTN   Patient Stated Goals Pt's goal for therapy is to go  back to work.     Currently in Pain? No/denies                Vestibular Assessment - 07/31/16 0807      Orthostatics   BP supine (x 5 minutes) 136/87   HR supine (x 5 minutes) 75   BP standing (after 1 minute) 110/75   HR standing (after 1 minute) 80   BP standing (after 3 minutes) 135/86   HR standing (after 3 minutes) 81                      Balance Exercises - 07/31/16 0828      Balance Exercises: Standing   Standing Eyes Opened Solid surface;Narrow base of support (BOS);Head turns   Standing Eyes Closed Solid surface;Narrow base of support (BOS);5 reps;10 secs;Wide (BOA);Head turns   Rockerboard Lateral  with/without visual cues for midline awareness           PT Education - 07/31/16 0843    Education provided Yes   Education Details orthostatic hypotension   Person(s) Educated Patient   Methods Explanation;Handout   Comprehension Verbalized understanding          PT Short Term Goals - 07/22/16 0922      PT SHORT TERM GOAL #1   Title Pt  will be independent with HEP for improved balance, functional mobility and gait.  TARGET 08/21/16   Time 4   Period Weeks   Status New     PT SHORT TERM GOAL #2   Title Pt will improve TUG to less than or equal to 13 seconds for decreased fall risk.   Time 4   Period Weeks   Status New     PT SHORT TERM GOAL #3   Title PT will rate dizziness increase less than 2 points during functional activities and gait.   Time 4   Period Weeks   Status New     PT SHORT TERM GOAL #4   Title Pt will verbalize understanding of CVA warning signs and risk factors.   Time 4   Period Weeks   Status New           PT Long Term Goals - 07/24/16 1142      PT LONG TERM GOAL #1   Title Pt will verbalize understanding of fall prevention in home environment.  TARGET 09/21/16   Time 7   Period Weeks   Status New     PT LONG TERM GOAL #2   Title Pt will improve Functional Gait Assessment to at least 20/30 for  decreased fall risk, improved funcitonal mobility.   Time 7   Period Weeks   Status New     PT LONG TERM GOAL #3   Title Pt will improve gait velocity to at least 3 ft/sec for improved gait efficiency and safety.   Time 7   Period Weeks   Status New     PT LONG TERM GOAL #4   Title Pt will negotiate 4 steps, no rail, while carrying objects, no LOB, independently for improved stair negotiation in preparation for return to work.   Time 7   Period Weeks   Status New     PT LONG TERM GOAL #5   Title Pt will improve SOT score to >/=70 (or WNL for his >/=56 (or WNL for his 56 year old age group)) to improve balance and safety.   Status New               Plan - 07/31/16 0840    Clinical Impression Statement Assessed orthostatic hypotension today and pt positive with symptoms.  Reviewed handout and recommended pt discuss tx options with MD.  Pt verbalized understanding.  Cotninues to have decreased midline awarenss with lean to R so session focused on that today.  Will continue to benefit from PT to maximize function.   PT Treatment/Interventions ADLs/Self Care Home Management;Canalith Repostioning;Functional mobility training;Stair training;Gait training;Therapeutic activities;Therapeutic exercise;Balance training;Neuromuscular re-education;Patient/family education;Vestibular   PT Next Visit Plan Add activities to HEP for balance and walking with head turns.   PT Home Exercise Plan Corner balance HEP   Consulted and Agree with Plan of Care Patient      Patient will benefit from skilled therapeutic intervention in order to improve the following deficits and impairments:  Abnormal gait, Decreased activity tolerance, Decreased balance, Decreased mobility, Dizziness, Difficulty walking  Visit Diagnosis: Dizziness and giddiness  Other abnormalities of gait and mobility  Unsteadiness on feet     Problem List Patient Active Problem List   Diagnosis Date Noted  . Cerebellar stroke ([place]) 07/17/2016   . Health maintenance examination 06/17/2015  . Mass of right wrist 06/17/2015  . Hypertriglyceridemia 06/17/2015  . Hypothyroidism (acquired)   . Swelling of left knee joint 10/19/2014  . Calcium pyrophosphate crystal disease 10/19/2014  .  Chest pain 03/12/2014  . Olecranon bursitis of right elbow 05/27/2012  . Essential hypertension 08/13/2010  . Alcohol use ([place]) 02/05/2009  . DEPRESSION, SITUATIONAL 02/05/2009  . TOBACCO ABUSE, HX OF 02/05/2009  . HSV 07/24/2008  . ANXIETY 07/24/2008  . INSOMNIA 07/24/2008  . THROAT CANCER 05/30/2008  . SHOULDER PAIN, RIGHT 12/20/2006     Laureen Abrahams, PT, DPT 07/31/16 8:44 AM     [facility] [address] [place], [place], [address] Phone: [phone]   Fax:  [phone]  Name: Kristien Mehaffey. MRN: [id number] Date of Birth: [birth year]

## 2016-08-04 ENCOUNTER — Encounter: Payer: Self-pay | Admitting: Family Medicine

## 2016-08-04 ENCOUNTER — Ambulatory Visit (INDEPENDENT_AMBULATORY_CARE_PROVIDER_SITE_OTHER): Payer: 59 | Admitting: Family Medicine

## 2016-08-04 VITALS — BP 130/78 | HR 78 | Temp 98.4°F | Wt 208.5 lb

## 2016-08-04 DIAGNOSIS — I639 Cerebral infarction, unspecified: Secondary | ICD-10-CM

## 2016-08-04 DIAGNOSIS — L989 Disorder of the skin and subcutaneous tissue, unspecified: Secondary | ICD-10-CM

## 2016-08-04 NOTE — Progress Notes (Signed)
Pre visit review using our clinic review tool, if applicable. No additional management support is needed unless otherwise documented below in the visit note. 

## 2016-08-04 NOTE — Patient Instructions (Signed)
No charge today Pass by our referral coordinators to schedule skin doctor appointment to check 2 spots on scalp

## 2016-08-04 NOTE — Progress Notes (Signed)
BP 130/78   Pulse 78   Temp 98.4 F (36.9 C) (Oral)   Wt 208 lb 8 oz (94.6 kg)   BMI 28.28 kg/m    CC: check mole Subjective:    Patient ID: Patrick Ochoa., male    DOB: 12-25-1960, 56 y.o.   MRN: [id number]  HPI: Patrick Anders. is a 56 y.o. male presenting on 08/04/2016 for check mole on top of head   Endorses lesion on scalp present over last 6-8 months. Pt states area started as mole that he can break off skin. He has no tried anything for this yet. Denies other changing moles on skin. He wants it removed - irritating and falls off when he combs his hair.   Known h/o throat cancer s/p excision 2010.   Relevant past medical, surgical, family and social history reviewed and updated as indicated. Interim medical history since our last visit reviewed. Allergies and medications reviewed and updated. Current Outpatient Prescriptions on File Prior to Visit  Medication Sig  . amitriptyline (ELAVIL) 50 MG tablet TAKE ONE TABLET BY MOUTH AT BEDTIME  . amLODipine (NORVASC) 10 MG tablet TAKE ONE TABLET BY MOUTH ONCE DAILY *PLEASE HAVE PATIENT CALL AND SCHEDULE A PHYSICAL*  . aspirin 325 MG tablet Take 325 mg by mouth daily.  . fenofibrate 160 MG tablet Take 1 tablet (160 mg total) by mouth daily.  . fish oil-omega-3 fatty acids 1000 MG capsule Take 1 g by mouth daily.    Marland Kitchen GARLIC PO Take by mouth daily.  Marland Kitchen levothyroxine (SYNTHROID, LEVOTHROID) 150 MCG tablet TAKE ONE TABLET BY MOUTH ONCE DAILY  . metoprolol (LOPRESSOR) 100 MG tablet TAKE ONE TABLET BY MOUTH TWICE DAILY  . omeprazole (PRILOSEC) 20 MG capsule Take 20 mg by mouth daily.  . meclizine (ANTIVERT) 12.5 MG tablet Take 1 tablet (12.5 mg total) by mouth 3 (three) times daily as needed for dizziness or nausea. (Patient not taking: Reported on 08/04/2016)   No current facility-administered medications on file prior to visit.    Past Medical History:  Diagnosis Date  . Abnormality of gait 07/17/2016  . Alcohol use   .  Arthritis   . Cerebellar stroke ([place]) 07/17/2016   Presumed, causing gait abnormality s/p eval by neuro [name]) 07/2016 overall improving.   Marland Kitchen CPDD (calcium pyrophosphate deposition disease) 10/2013   R hand xray, L knee xray  . GERD (gastroesophageal reflux disease)   . Gout   . History of smoking   . HTN (hypertension)   . Hypertension   . Hypothyroidism (acquired)   . Insomnia   . Throat cancer ([place])    Dr. Caryl Pina [facility])  . TOBACCO ABUSE, HX OF 02/05/2009   Quit 2011      Past Surgical History:  Procedure Laterality Date  . MASS EXCISION Right 03/19/2016   EXCISION LIPOMA RIGHT WRIST;  Surgeon: Leanora Cover, MD  . NECK SURGERY  2010   oral cancer excision - [facility] (OMFS)  . SKIN LESION EXCISION  08/2010   Forehead pyogenic granuloma [name]    Social History  Substance Use Topics  . Smoking status: Former Smoker    Packs/day: 0.50    Years: 15.00    Types: Cigarettes    Quit date: 08/03/2008  . Smokeless tobacco: Never Used  . Alcohol use 3.6 oz/week    6 Cans of beer per week     Comment: 6 pack per day    Review of Systems Per HPI unless  specifically indicated in ROS section     Objective:    BP 130/78   Pulse 78   Temp 98.4 F (36.9 C) (Oral)   Wt 208 lb 8 oz (94.6 kg)   BMI 28.28 kg/m   Wt Readings from Last 3 Encounters:  08/04/16 208 lb 8 oz (94.6 kg)  07/17/16 201 lb 12 oz (91.5 kg)  07/14/16 203 lb (92.1 kg)    Physical Exam  Constitutional: He appears well-developed and well-nourished. No distress.  Skin: Skin is warm and dry. No rash noted.  R anterior scalp with nontender hardened scaly nodule without surrounding erythema Posterior to lesion is small poorly healing erosion with scab  Nursing note and vitals reviewed.      Assessment & Plan:   Problem List Items Addressed This Visit    Cerebellar stroke ([place])    Continues vestibular PT for balance training after cerebellar stroke.  On full dose aspirin, fenofibrate, beta blocker.         Scalp lesion - Primary    Anticipate benign lesion - desires removal - will refer to derm for this and for evaluation of poorly healing would posterior scalp.  For poorly healing wound - rec vasoline daily for next 1-2 weeks to give wound a chance to heal.       Relevant Orders   Ambulatory referral to Dermatology       Follow up plan: No Follow-up on file.  Ria Bush, MD

## 2016-08-04 NOTE — Assessment & Plan Note (Addendum)
Anticipate benign lesion - desires removal - will refer to derm for this and for evaluation of poorly healing would posterior scalp.  For poorly healing wound - rec vasoline daily for next 1-2 weeks to give wound a chance to heal.

## 2016-08-04 NOTE — Assessment & Plan Note (Addendum)
Continues vestibular PT for balance training after cerebellar stroke.  On full dose aspirin, fenofibrate, beta blocker.

## 2016-08-05 ENCOUNTER — Ambulatory Visit: Payer: 59 | Attending: Neurology | Admitting: Physical Therapy

## 2016-08-05 DIAGNOSIS — R2681 Unsteadiness on feet: Secondary | ICD-10-CM | POA: Insufficient documentation

## 2016-08-05 DIAGNOSIS — R2689 Other abnormalities of gait and mobility: Secondary | ICD-10-CM | POA: Diagnosis not present

## 2016-08-05 DIAGNOSIS — R42 Dizziness and giddiness: Secondary | ICD-10-CM | POA: Insufficient documentation

## 2016-08-05 NOTE — Patient Instructions (Signed)
Gaze Stabilization: Standing Feet Apart    Feet shoulder width apart, keeping eyes on target on wall _5___ feet away,  move head side to side for __30__ seconds. Repeat while moving head up and down for __30__ seconds. Do _2-3___ sessions per day.  Copyright  VHI. All rights reserved.    Gaze Stabilization: Tip Card  1.Target must remain in focus, not blurry, and appear stationary while head is in motion. 2.Perform exercises with small head movements (45 to either side of midline). 3.Increase speed of head motion so long as target is in focus. 4.If you wear eyeglasses, be sure you can see target through lens (therapist will give specific instructions for bifocal / progressive lenses). 5.These exercises may provoke dizziness or nausea. Work through these symptoms. If too dizzy, slow head movement slightly. Rest between each exercise. 6.Exercises demand concentration; avoid distractions. 7.For safety, perform standing exercises close to a counter, wall, corner, or next to someone.  Copyright  VHI. All rights reserved.

## 2016-08-05 NOTE — Therapy (Signed)
[facility] [address] [place], [place], [address] Phone: [phone]   Fax:  [phone]  Physical Therapy Treatment  Patient Details  Name: Patrick Bailey. MRN: [id number] Date of Birth: 01/06/1961 Referring Provider: [name]  Encounter Date: 08/05/2016      PT End of Session - 08/05/16 0853    Visit Number 4   Number of Visits 14   Date for PT Re-Evaluation 09/21/16   Authorization Type UHC   PT Start Time 0810   PT Stop Time 0850   PT Time Calculation (min) 40 min   Equipment Utilized During Treatment Gait belt   Activity Tolerance Patient tolerated treatment well   Behavior During Therapy WFL for tasks assessed/performed      Past Medical History:  Diagnosis Date  . Abnormality of gait 07/17/2016  . Alcohol use   . Arthritis   . Cerebellar stroke ([place]) 07/17/2016   Presumed, causing gait abnormality s/p eval by neuro [name]) 07/2016 overall improving.   Marland Kitchen CPDD (calcium pyrophosphate deposition disease) 10/2013   R hand xray, L knee xray  . GERD (gastroesophageal reflux disease)   . Gout   . History of smoking   . HTN (hypertension)   . Hypertension   . Hypothyroidism (acquired)   . Insomnia   . Throat cancer ([place])    Dr. Caryl Pina [facility])  . TOBACCO ABUSE, HX OF 02/05/2009   Quit 2011      Past Surgical History:  Procedure Laterality Date  . MASS EXCISION Right 03/19/2016   EXCISION LIPOMA RIGHT WRIST;  Surgeon: Leanora Cover, MD  . NECK SURGERY  2010   oral cancer excision - [facility] (OMFS)  . SKIN LESION EXCISION  08/2010   Forehead pyogenic granuloma [name]    There were no vitals filed for this visit.      Subjective Assessment - 08/05/16 0808    Subjective went to PCP yesterday for skin lesion - referral to dermatologist.  Still feeling dizzy and feels like the bottom of feet are going numb and "getting worse."   Pertinent History History of cerebellar CVA, HTN   Patient Stated  Goals Pt's goal for therapy is to go back to work.     Currently in Pain? No/denies                          Vestibular Treatment/Exercise - 08/05/16 0824      Vestibular Treatment/Exercise   Vestibular Treatment Provided Gaze   Gaze Exercises X1 Viewing Vertical;X1 Viewing Horizontal;Eye/Head Exercise Horizontal     X1 Viewing Horizontal   Foot Position apart   Time --  30 sec   Reps 2   Comments up to 7/10 symptoms     X1 Viewing Vertical   Foot Position apart   Time --  30 sec   Reps 2   Comments pt reports decreased symptoms compared to horizontal     Eye/Head Exercise Horizontal   Foot Position seated   Reps 10  bil   Comments corrective saccades            Balance Exercises - 08/05/16 0835      Balance Exercises: Standing   Gait with Head Turns Forward  horizontal/vertical 40'x4; up to 7/10 symptoms with lateral   Other Standing Exercises amb with ball toss horizontal and vertical 200' each; mild increase in symptoms with each  PT Education - 08/05/16 0853    Education provided Yes   Education Details standing gaze   Person(s) Educated Patient   Methods Explanation;Demonstration;Handout   Comprehension Verbalized understanding          PT Short Term Goals - 07/22/16 0922      PT SHORT TERM GOAL #1   Title Pt will be independent with HEP for improved balance, functional mobility and gait.  TARGET 08/21/16   Time 4   Period Weeks   Status New     PT SHORT TERM GOAL #2   Title Pt will improve TUG to less than or equal to 13 seconds for decreased fall risk.   Time 4   Period Weeks   Status New     PT SHORT TERM GOAL #3   Title PT will rate dizziness increase less than 2 points during functional activities and gait.   Time 4   Period Weeks   Status New     PT SHORT TERM GOAL #4   Title Pt will verbalize understanding of CVA warning signs and risk factors.   Time 4   Period Weeks   Status New            PT Long Term Goals - 07/24/16 1142      PT LONG TERM GOAL #1   Title Pt will verbalize understanding of fall prevention in home environment.  TARGET 09/21/16   Time 7   Period Weeks   Status New     PT LONG TERM GOAL #2   Title Pt will improve Functional Gait Assessment to at least 20/30 for decreased fall risk, improved funcitonal mobility.   Time 7   Period Weeks   Status New     PT LONG TERM GOAL #3   Title Pt will improve gait velocity to at least 3 ft/sec for improved gait efficiency and safety.   Time 7   Period Weeks   Status New     PT LONG TERM GOAL #4   Title Pt will negotiate 4 steps, no rail, while carrying objects, no LOB, independently for improved stair negotiation in preparation for return to work.   Time 7   Period Weeks   Status New     PT LONG TERM GOAL #5   Title Pt will improve SOT score to >/=70 (or WNL for his 53-48 year old age group) to improve balance and safety.   Status New               Plan - 08/05/16 [id number]    Clinical Impression Statement Pt with increase in symptoms with all activities today up to 7/10 from 4/10 baseline.  Rest breaks needed to recover to baseline, but pt motivated and eager to continue.  Issued standing gaze x 1 HEP today.  Pt also expressed eagerness to return to strength training (has home gym).  Will plan to initiate strength training program next session.   PT Treatment/Interventions ADLs/Self Care Home Management;Canalith Repostioning;Functional mobility training;Stair training;Gait training;Therapeutic activities;Therapeutic exercise;Balance training;Neuromuscular re-education;Patient/family education;Vestibular   PT Next Visit Plan review gaze and progress PRN (feet together v/s compliant surface v/s full field stimulus), initiate strength program focusing on low resistance/weight, high reps   Consulted and Agree with Plan of Care Patient      Patient will benefit from skilled therapeutic intervention in order to  improve the following deficits and impairments:  Abnormal gait, Decreased activity tolerance, Decreased balance, Decreased mobility, Dizziness, Difficulty walking  Visit Diagnosis: Dizziness and giddiness  Other abnormalities of gait and mobility  Unsteadiness on feet     Problem List Patient Active Problem List   Diagnosis Date Noted  . Scalp lesion 08/04/2016  . Cerebellar stroke ([place]) 07/17/2016  . Health maintenance examination 06/17/2015  . Mass of right wrist 06/17/2015  . Hypertriglyceridemia 06/17/2015  . Hypothyroidism (acquired)   . Swelling of left knee joint 10/19/2014  . Calcium pyrophosphate crystal disease 10/19/2014  . Chest pain 03/12/2014  . Olecranon bursitis of right elbow 05/27/2012  . Essential hypertension 08/13/2010  . Alcohol use ([place]) 02/05/2009  . DEPRESSION, SITUATIONAL 02/05/2009  . TOBACCO ABUSE, HX OF 02/05/2009  . HSV 07/24/2008  . ANXIETY 07/24/2008  . INSOMNIA 07/24/2008  . THROAT CANCER 05/30/2008  . SHOULDER PAIN, RIGHT 12/20/2006      Laureen Abrahams, PT, DPT 08/05/16 9:02 AM    [facility] [address] [place], [place], [address] Phone: [phone]   Fax:  [phone]  Name: Patrick Bailey. MRN: [id number] Date of Birth: Jul 01, 1961

## 2016-08-07 ENCOUNTER — Telehealth: Payer: Self-pay | Admitting: Neurology

## 2016-08-07 ENCOUNTER — Ambulatory Visit: Payer: 59

## 2016-08-07 VITALS — BP 148/95 | HR 77

## 2016-08-07 DIAGNOSIS — R2689 Other abnormalities of gait and mobility: Secondary | ICD-10-CM

## 2016-08-07 DIAGNOSIS — R42 Dizziness and giddiness: Secondary | ICD-10-CM | POA: Diagnosis not present

## 2016-08-07 NOTE — Telephone Encounter (Signed)
I called the patient, talk with the father, the patient is having some numbness in the feet. I will evaluate this issue when I see him in office on the 17th.

## 2016-08-07 NOTE — Patient Instructions (Addendum)
Gaze Stabilization: Standing Feet Apart    Feet shoulder width apart, keeping eyes on target on wall _5___ feet away, TUCK CHIN ABOUT 30 DEGREES and move head side to side for __30__ seconds. Repeat while moving head up and down for __30__ seconds. Do _2-3___ sessions per day.  Copyright  VHI. All rights reserved.    Gaze Stabilization: Tip Card  1.Target must remain in focus, not blurry, and appear stationary while head is in motion. 2.Perform exercises with small head movements (45 to either side of midline). 3.Increase speed of head motion so long as target is in focus. 4.If you wear eyeglasses, be sure you can see target through lens (therapist will give specific instructions for bifocal / progressive lenses). 5.These exercises may provoke dizziness or nausea. Work through these symptoms. If too dizzy, slow head movement slightly. Rest between each exercise. 6.Exercises demand concentration; avoid distractions. 7.For safety, perform standing exercises close to a counter, wall, corner, or next to someone.  Copyright  VHI. All rights reserved.   Bending / Picking Up Objects    Sitting, slowly bend head down and pick up object on the floor (or just bring nose to lap). Return to upright position. Hold position until symptoms subside. Repeat __5__ times per session. Do __1-2__ sessions per day.  Copyright  VHI. All rights reserved.   HIP / KNEE: Flexion / Extension, Wall Squat    Stance: shoulder-width on floor, against wall. Place feet in front of hips, about 12-18 inches from wall. Bend hips and knees to about 90 degrees (as tolerated). Keep back straight. Do not allow knees to bend past toes. Hold for 5 seconds. Squeeze glutes and quads to stand. _5__ reps per set, _2__ sets per day, __3_ days per week  Copyright  VHI. All rights reserved.   Side Stepping With Semi-Squat    Tie red band above knees and perform a mini-squat (make sure knees don't go past toes).  Then Step to side (about 10 feet along counter) with hand support on counter as needed. Keep heels on ground. Perform _2__ laps side stepping right. Perform _2__ laps side stepping left.  Copyright  VHI. All rights reserved.   Dip ([place])    Perform on a flat surface (solid sofa). Lower body almost to floor (keep knees bent in squat position vs. The picture above with legs straight), elbows close to sides, press upward until arms are straight. Do __10__ reps. Perform 3 sets. Perform 3 days a week.  [email or web address].exer.[email or web address]   Copyright  VHI. All rights reserved.

## 2016-08-07 NOTE — Therapy (Signed)
[facility] [address] [place], [place], [address] Phone: [phone]   Fax:  [phone]  Physical Therapy Treatment  Patient Details  Name: Patrick Bailey. MRN: [id number] Date of Birth: 1961-08-01 Referring Provider: [name]  Encounter Date: 08/07/2016      PT End of Session - 08/07/16 0931    Visit Number 5   Number of Visits 14   Date for PT Re-Evaluation 09/21/16   Authorization Type UHC   PT Start Time 0846   PT Stop Time 0931   PT Time Calculation (min) 45 min   Equipment Utilized During Treatment --  S for safety   Activity Tolerance Patient tolerated treatment well   Behavior During Therapy [facility] for tasks assessed/performed      Past Medical History:  Diagnosis Date  . Abnormality of gait 07/17/2016  . Alcohol use   . Arthritis   . Cerebellar stroke ([place]) 07/17/2016   Presumed, causing gait abnormality s/p eval by neuro [name]) 07/2016 overall improving.   Marland Kitchen CPDD (calcium pyrophosphate deposition disease) 10/2013   R hand xray, L knee xray  . GERD (gastroesophageal reflux disease)   . Gout   . History of smoking   . HTN (hypertension)   . Hypertension   . Hypothyroidism (acquired)   . Insomnia   . Throat cancer ([place])    Dr. Caryl Pina [facility])  . TOBACCO ABUSE, HX OF 02/05/2009   Quit 2011      Past Surgical History:  Procedure Laterality Date  . MASS EXCISION Right 03/19/2016   EXCISION LIPOMA RIGHT WRIST;  Surgeon: Leanora Cover, MD  . NECK SURGERY  2010   oral cancer excision - [facility] (OMFS)  . SKIN LESION EXCISION  08/2010   Forehead pyogenic granuloma [name]    Vitals:   08/07/16 0931 08/07/16 1358  BP: (!) 152/97 (!) 148/95  Pulse: 77         Subjective Assessment - 08/07/16 0849    Subjective Pt denied changes or falls since last visit. pt reported gaze stabilization HEP still makes him feel dizzy. Pt reports N/T in ankles/feet is getting worse.   Pertinent History History  of cerebellar CVA, HTN   Patient Stated Goals Pt's goal for therapy is to go back to work.     Currently in Pain? No/denies          Neuro re-ed and Therex: Pt performed gaze and habituation activities with cues for technique and strengthening HEP with cues and demo for technique. Performed with S. Please see pt instructions for details. Pt's BP elevated after session, and pt reported he'd inform MD and was ok with PT sending note to MD.                        PT Education - 08/07/16 [id number]    Education provided Yes   Education Details PT educated pt on informing MD regarding N/T in B feet/ankles. PT also reviewed gaze stab. HEP and added habituation and strengthening to HEP. PT educated pt on BP Colletta [place], other PT with pt at end of session to finish assessing BP 2/2 time contraints) also reiterated importance of informing MD re: feet N/T and elevated BP.   Person(s) Educated Patient   Methods Explanation;Demonstration;Verbal cues;Handout   Comprehension Verbalized understanding;Returned demonstration          PT Short Term Goals - 07/22/16 0922      PT SHORT TERM GOAL #1  Title Pt will be independent with HEP for improved balance, functional mobility and gait.  TARGET 08/21/16   Time 4   Period Weeks   Status New     PT SHORT TERM GOAL #2   Title Pt will improve TUG to less than or equal to 13 seconds for decreased fall risk.   Time 4   Period Weeks   Status New     PT SHORT TERM GOAL #3   Title PT will rate dizziness increase less than 2 points during functional activities and gait.   Time 4   Period Weeks   Status New     PT SHORT TERM GOAL #4   Title Pt will verbalize understanding of CVA warning signs and risk factors.   Time 4   Period Weeks   Status New           PT Long Term Goals - 07/24/16 1142      PT LONG TERM GOAL #1   Title Pt will verbalize understanding of fall prevention in home environment.  TARGET 09/21/16   Time 7    Period Weeks   Status New     PT LONG TERM GOAL #2   Title Pt will improve Functional Gait Assessment to at least 20/30 for decreased fall risk, improved funcitonal mobility.   Time 7   Period Weeks   Status New     PT LONG TERM GOAL #3   Title Pt will improve gait velocity to at least 3 ft/sec for improved gait efficiency and safety.   Time 7   Period Weeks   Status New     PT LONG TERM GOAL #4   Title Pt will negotiate 4 steps, no rail, while carrying objects, no LOB, independently for improved stair negotiation in preparation for return to work.   Time 7   Period Weeks   Status New     PT LONG TERM GOAL #5   Title Pt will improve SOT score to >/=70 (or WNL for his 57-62 year old age group) to improve balance and safety.   Status New               Plan - 08/07/16 1400    Clinical Impression Statement Pt demonstrated progress, as he was able to tolerate strengthening HEP (to improve strength and endurance for work activities) and neuro re-ed activities without rest breaks. However, pt's BP elevated at end of session. PT will send note to MD to notify MD about pt's elevated BP and B feet N/T. continue with POC and monitor vitals as indicated.    PT Treatment/Interventions ADLs/Self Care Home Management;Canalith Repostioning;Functional mobility training;Stair training;Gait training;Therapeutic activities;Therapeutic exercise;Balance training;Neuromuscular re-education;Patient/family education;Vestibular   PT Next Visit Plan review gaze and progress PRN (feet together v/s compliant surface v/s full field stimulus), high level balance and gait (simulate work activities)   Oncologist with Plan of Care Patient      Patient will benefit from skilled therapeutic intervention in order to improve the following deficits and impairments:  Abnormal gait, Decreased activity tolerance, Decreased balance, Decreased mobility, Dizziness, Difficulty walking  Visit  Diagnosis: Dizziness and giddiness  Other abnormalities of gait and mobility     Problem List Patient Active Problem List   Diagnosis Date Noted  . Scalp lesion 08/04/2016  . Cerebellar stroke ([place]) 07/17/2016  . Health maintenance examination 06/17/2015  . Mass of right wrist 06/17/2015  . Hypertriglyceridemia 06/17/2015  . Hypothyroidism (acquired)   .  Swelling of left knee joint 10/19/2014  . Calcium pyrophosphate crystal disease 10/19/2014  . Chest pain 03/12/2014  . Olecranon bursitis of right elbow 05/27/2012  . Essential hypertension 08/13/2010  . Alcohol use ([place]) 02/05/2009  . DEPRESSION, SITUATIONAL 02/05/2009  . TOBACCO ABUSE, HX OF 02/05/2009  . HSV 07/24/2008  . ANXIETY 07/24/2008  . INSOMNIA 07/24/2008  . THROAT CANCER 05/30/2008  . SHOULDER PAIN, RIGHT 12/20/2006    Miller,Jennifer L 08/07/2016, 2:02 PM  [facility] [address] [place], [place], [address] Phone: [phone]   Fax:  [phone]  Name: Charbel Mccleaf. MRN: [id number] Date of Birth: 08/15/1960  Geoffry Paradise, PT,DPT 08/07/16 2:04 PM Phone: [phone] Fax: [phone]

## 2016-08-07 NOTE — Telephone Encounter (Signed)
Patient went to PT today and they suggested he stop by to tell us that his feet are going numb. He has an apt on the 17th.  Best call back is [phone]

## 2016-08-07 NOTE — Telephone Encounter (Signed)
Patrick Bailey was seen as a new patient on 07/17/16 for gait ataxia, probable medullary stroke and is currently participating in PT. C/o bilat foot numbness during therapy. He does have a follow-up appt scheduled 08/19/16.

## 2016-08-12 ENCOUNTER — Other Ambulatory Visit: Payer: Self-pay | Admitting: Family Medicine

## 2016-08-12 ENCOUNTER — Ambulatory Visit: Payer: 59 | Admitting: Physical Therapy

## 2016-08-12 DIAGNOSIS — R42 Dizziness and giddiness: Secondary | ICD-10-CM

## 2016-08-12 DIAGNOSIS — R2689 Other abnormalities of gait and mobility: Secondary | ICD-10-CM

## 2016-08-12 DIAGNOSIS — R2681 Unsteadiness on feet: Secondary | ICD-10-CM

## 2016-08-12 NOTE — Telephone Encounter (Signed)
Pt came by requesting status of refill amlodipine and metoprolol; pt scheduled CPX on 08/22/15 at 12 noon. Refill done x 1 until appt. Pt voiced understanding. Last CPX on 06/17/15.

## 2016-08-12 NOTE — Patient Instructions (Signed)
  For eye exercise looking at target and moving head side/side and up/down; put your feet together on 1-2 pillows or a cushion for added challenge.

## 2016-08-12 NOTE — Therapy (Signed)
[facility] [address] [place], [place], [address] Phone: [phone]   Fax:  [phone]  Physical Therapy Treatment  Patient Details  Name: Patrick Bailey. MRN: [id number] Date of Birth: [birth year] Referring Provider: [name]  Encounter Date: 08/12/2016      PT End of Session - 08/12/16 0839    Visit Number 6   Number of Visits 14   Date for PT Re-Evaluation 09/21/16   Authorization Type UHC   PT Start Time 0800   PT Stop Time 0839   PT Time Calculation (min) 39 min   Activity Tolerance Patient tolerated treatment well   Behavior During Therapy [facility] for tasks assessed/performed      Past Medical History:  Diagnosis Date  . Abnormality of gait 07/17/2016  . Alcohol use   . Arthritis   . Cerebellar stroke ([place]) 07/17/2016   Presumed, causing gait abnormality s/p eval by neuro [name]) 07/2016 overall improving.   Marland Kitchen CPDD (calcium pyrophosphate deposition disease) 10/2013   R hand xray, L knee xray  . GERD (gastroesophageal reflux disease)   . Gout   . History of smoking   . HTN (hypertension)   . Hypertension   . Hypothyroidism (acquired)   . Insomnia   . Throat cancer ([place])    Dr. Caryl Pina [facility])  . TOBACCO ABUSE, HX OF 02/05/2009   Quit 2011      Past Surgical History:  Procedure Laterality Date  . MASS EXCISION Right 03/19/2016   EXCISION LIPOMA RIGHT WRIST;  Surgeon: Leanora Cover, MD  . NECK SURGERY  2010   oral cancer excision - [facility] (OMFS)  . SKIN LESION EXCISION  08/2010   Forehead pyogenic granuloma [name]    There were no vitals filed for this visit.      Subjective Assessment - 08/12/16 0800    Subjective pt states exercises are "easy."  bending over and coming back up isn't making pt dizzy anymore.   Pertinent History History of cerebellar CVA, HTN   Patient Stated Goals Pt's goal for therapy is to go back to work.     Currently in Pain? No/denies                          [facility] Adult PT Treatment/Exercise - 08/12/16 0816      Exercises   Exercises Knee/Hip     Knee/Hip Exercises: Standing   Functional Squat Limitations sidestepping with squats 15' x2 with red theraband   Wall Squat 2 sets;5 reps;5 seconds         Vestibular Treatment/Exercise - 08/12/16 0817      Vestibular Treatment/Exercise   Gaze Exercises X1 Viewing Vertical;X1 Viewing Horizontal;Eye/Head Exercise Horizontal     X1 Viewing Horizontal   Foot Position apart and together on compliant surface   Time --  30 sec   Reps 1  each   Comments increase in symptoms with compliant surface     X1 Viewing Vertical   Foot Position apart and together on compliant surface   Time --  30 sec   Reps 1  each   Comments increase in symptoms with compliant surface            Balance Exercises - 08/12/16 0837      Balance Exercises: Standing   Other Standing Exercises work simulated activities: crawling and tall kneeling forwards/backwards with head motions (laterally, up/down and diagonals); walking with 30# plus crate 220' without  difficulty           PT Education - 08/12/16 0839    Education provided Yes   Education Details progressed gaze to feet together on compliant surface   Person(s) Educated Patient   Methods Explanation;Demonstration;Handout   Comprehension Verbalized understanding;Returned demonstration          PT Short Term Goals - 07/22/16 [id number]      PT SHORT TERM GOAL #1   Title Pt will be independent with HEP for improved balance, functional mobility and gait.  TARGET 08/21/16   Time 4   Period Weeks   Status New     PT SHORT TERM GOAL #2   Title Pt will improve TUG to less than or equal to 13 seconds for decreased fall risk.   Time 4   Period Weeks   Status New     PT SHORT TERM GOAL #3   Title PT will rate dizziness increase less than 2 points during functional activities and gait.   Time 4   Period Weeks    Status New     PT SHORT TERM GOAL #4   Title Pt will verbalize understanding of CVA warning signs and risk factors.   Time 4   Period Weeks   Status New           PT Long Term Goals - 07/24/16 1142      PT LONG TERM GOAL #1   Title Pt will verbalize understanding of fall prevention in home environment.  TARGET 09/21/16   Time 7   Period Weeks   Status New     PT LONG TERM GOAL #2   Title Pt will improve Functional Gait Assessment to at least 20/30 for decreased fall risk, improved funcitonal mobility.   Time 7   Period Weeks   Status New     PT LONG TERM GOAL #3   Title Pt will improve gait velocity to at least 3 ft/sec for improved gait efficiency and safety.   Time 7   Period Weeks   Status New     PT LONG TERM GOAL #4   Title Pt will negotiate 4 steps, no rail, while carrying objects, no LOB, independently for improved stair negotiation in preparation for return to work.   Time 7   Period Weeks   Status New     PT LONG TERM GOAL #5   Title Pt will improve SOT score to >/=70 (or WNL for his 57-57 year old age group) to improve balance and safety.   Status New               Plan - 08/12/16 0840    Clinical Impression Statement Session today focused on new HEP review and able to progress gaze today as pt with improving symptoms.  Work simulated activities provoked mild increase in symptoms with head turns, which quickly resolved.  Will continue to benefit from PT to maximize function.   PT Treatment/Interventions ADLs/Self Care Home Management;Canalith Repostioning;Functional mobility training;Stair training;Gait training;Therapeutic activities;Therapeutic exercise;Balance training;Neuromuscular re-education;Patient/family education;Vestibular   PT Next Visit Plan progress gaze PRN, continue work simulation activities   Consulted and Agree with Plan of Care Patient      Patient will benefit from skilled therapeutic intervention in order to improve the  following deficits and impairments:  Abnormal gait, Decreased activity tolerance, Decreased balance, Decreased mobility, Dizziness, Difficulty walking  Visit Diagnosis: Dizziness and giddiness  Other abnormalities of gait and mobility  Unsteadiness on feet  Problem List Patient Active Problem List   Diagnosis Date Noted  . Scalp lesion 08/04/2016  . Cerebellar stroke ([place]) 07/17/2016  . Health maintenance examination 06/17/2015  . Mass of right wrist 06/17/2015  . Hypertriglyceridemia 06/17/2015  . Hypothyroidism (acquired)   . Swelling of left knee joint 10/19/2014  . Calcium pyrophosphate crystal disease 10/19/2014  . Chest pain 03/12/2014  . Olecranon bursitis of right elbow 05/27/2012  . Essential hypertension 08/13/2010  . Alcohol use ([place]) 02/05/2009  . DEPRESSION, SITUATIONAL 02/05/2009  . TOBACCO ABUSE, HX OF 02/05/2009  . HSV 07/24/2008  . ANXIETY 07/24/2008  . INSOMNIA 07/24/2008  . THROAT CANCER 05/30/2008  . SHOULDER PAIN, RIGHT 12/20/2006      Laureen Abrahams, PT, DPT 08/12/16 8:42 AM    [facility] [address] [place], [place], [address] Phone: [phone]   Fax:  [phone]  Name: Patrick Bailey. MRN: [id number] Date of Birth: March 02, 1961

## 2016-08-13 ENCOUNTER — Telehealth: Payer: Self-pay

## 2016-08-13 NOTE — Telephone Encounter (Signed)
[place] paperwork completed and signed. Pt out of work x 6 wks w/ return date 09/01/15. Forms to medical records for payment/processing.

## 2016-08-14 ENCOUNTER — Encounter: Payer: Self-pay | Admitting: Physical Therapy

## 2016-08-14 ENCOUNTER — Ambulatory Visit: Payer: 59 | Admitting: Physical Therapy

## 2016-08-14 ENCOUNTER — Telehealth: Payer: Self-pay | Admitting: *Deleted

## 2016-08-14 DIAGNOSIS — R2689 Other abnormalities of gait and mobility: Secondary | ICD-10-CM

## 2016-08-14 DIAGNOSIS — D485 Neoplasm of uncertain behavior of skin: Secondary | ICD-10-CM | POA: Diagnosis not present

## 2016-08-14 DIAGNOSIS — L814 Other melanin hyperpigmentation: Secondary | ICD-10-CM | POA: Diagnosis not present

## 2016-08-14 DIAGNOSIS — L821 Other seborrheic keratosis: Secondary | ICD-10-CM | POA: Diagnosis not present

## 2016-08-14 DIAGNOSIS — R2681 Unsteadiness on feet: Secondary | ICD-10-CM

## 2016-08-14 DIAGNOSIS — R42 Dizziness and giddiness: Secondary | ICD-10-CM | POA: Diagnosis not present

## 2016-08-14 DIAGNOSIS — L570 Actinic keratosis: Secondary | ICD-10-CM | POA: Diagnosis not present

## 2016-08-14 DIAGNOSIS — B078 Other viral warts: Secondary | ICD-10-CM | POA: Diagnosis not present

## 2016-08-14 DIAGNOSIS — D225 Melanocytic nevi of trunk: Secondary | ICD-10-CM | POA: Diagnosis not present

## 2016-08-14 NOTE — Therapy (Signed)
[facility] [address] [place], [place], [address] Phone: [phone]   Fax:  [phone]  Physical Therapy Treatment  Patient Details  Name: Patrick Bailey. MRN: [id number] Date of Birth: 01-11-1961 Referring Provider: [name]  Encounter Date: 08/14/2016      PT End of Session - 08/14/16 0854    Visit Number 7   Number of Visits 14   Date for PT Re-Evaluation 09/21/16   Authorization Type UHC   PT Start Time 0850   PT Stop Time 0930   PT Time Calculation (min) 40 min   Activity Tolerance Patient tolerated treatment well   Behavior During Therapy [facility] for tasks assessed/performed      Past Medical History:  Diagnosis Date  . Abnormality of gait 07/17/2016  . Alcohol use   . Arthritis   . Cerebellar stroke ([place]) 07/17/2016   Presumed, causing gait abnormality s/p eval by neuro [name]) 07/2016 overall improving.   Marland Kitchen CPDD (calcium pyrophosphate deposition disease) 10/2013   R hand xray, L knee xray  . GERD (gastroesophageal reflux disease)   . Gout   . History of smoking   . HTN (hypertension)   . Hypertension   . Hypothyroidism (acquired)   . Insomnia   . Throat cancer ([place])    Dr. Caryl Pina [facility])  . TOBACCO ABUSE, HX OF 02/05/2009   Quit 2011      Past Surgical History:  Procedure Laterality Date  . MASS EXCISION Right 03/19/2016   EXCISION LIPOMA RIGHT WRIST;  Surgeon: Leanora Cover, MD  . NECK SURGERY  2010   oral cancer excision - [facility] (OMFS)  . SKIN LESION EXCISION  08/2010   Forehead pyogenic granuloma [name]    There were no vitals filed for this visit.      Subjective Assessment - 08/14/16 0853    Subjective No new complaints. Reports HEP is getting easy   Pertinent History History of cerebellar CVA, HTN   Patient Stated Goals Pt's goal for therapy is to go back to work.     Currently in Pain? No/denies             Vestibular Treatment/Exercise - 08/14/16 0855      X1  Viewing Horizontal   Foot Position apart and together on compliant surface   Time --  15 seconds each   Reps 3  each   Comments mild dizziness each rep that resolved within 5-10 sec's     X1 Viewing Vertical   Foot Position apart and together on compliant surface   Time --  15 sec's each   Reps 3  x 3 reps   Comments mild dizziness reported that resolved within 5-10 seconds            Balance Exercises - 08/14/16 1229      Balance Exercises: Standing   Standing Eyes Opened Foam/compliant surface;Other reps (comment);Limitations   Other Standing Exercises standing on doubled blue mat: bending forward to do figure 8 with ball around legs, back up to perform 180 degree turn, back down for another figure 8 around legs- repeated x 10 turns with supervision, minimal dizziness reported. Gait around track for 230 feet with self ball toss, tracking ball with eyes, no dizzness reported.  Work simulation activites: crawling fwd/bwd across both red mats with head movements left<>right, then up<>down x 4 laps each with mild symptoms reported that resolved within 8-10 seconds of rest; with step stool- bending down to retrieve sticky  note off floor then climbing up to place high on wall x 10 reps, no symptoms reported, standing on top of step stool- moving sticky notes from top to bottom of wall with eyes tracking movements x 10 with minimal dizziness reported, resolved very quickly; on full ladder: moving sticky notes from low to high/lateral spots on right and left, tracking with eyes x 5 each way with no symptoms noted.                                 Balance Exercises: Standing   Standing Eyes Opened Limitations on minitramp: small bouncing with head movements left<>right and up<>down x 10 reps each, mild dzziness that resolved quickly; alternating marching with head movements up<>down and left<>right x 10 each with mild symptoms reported that resolved quickly.                                           PT Short Term Goals - 07/22/16 [id number]      PT SHORT TERM GOAL #1   Title Pt will be independent with HEP for improved balance, functional mobility and gait.  TARGET 08/21/16   Time 4   Period Weeks   Status New     PT SHORT TERM GOAL #2   Title Pt will improve TUG to less than or equal to 13 seconds for decreased fall risk.   Time 4   Period Weeks   Status New     PT SHORT TERM GOAL #3   Title PT will rate dizziness increase less than 2 points during functional activities and gait.   Time 4   Period Weeks   Status New     PT SHORT TERM GOAL #4   Title Pt will verbalize understanding of CVA warning signs and risk factors.   Time 4   Period Weeks   Status New           PT Long Term Goals - 07/24/16 1142      PT LONG TERM GOAL #1   Title Pt will verbalize understanding of fall prevention in home environment.  TARGET 09/21/16   Time 7   Period Weeks   Status New     PT LONG TERM GOAL #2   Title Pt will improve Functional Gait Assessment to at least 20/30 for decreased fall risk, improved funcitonal mobility.   Time 7   Period Weeks   Status New     PT LONG TERM GOAL #3   Title Pt will improve gait velocity to at least 3 ft/sec for improved gait efficiency and safety.   Time 7   Period Weeks   Status New     PT LONG TERM GOAL #4   Title Pt will negotiate 4 steps, no rail, while carrying objects, no LOB, independently for improved stair negotiation in preparation for return to work.   Time 7   Period Weeks   Status New     PT LONG TERM GOAL #5   Title Pt will improve SOT score to >/=70 (or WNL for his 26-43 year old age group) to improve balance and safety.   Status New          Plan - 08/14/16 0854    Clinical Impression Statement Today's skilled session continued to address balance and work  simulation activities. Continues to report mild symptoms with some activities that resolve quickly. Pt is progressing toward goals and should benefit from  continued PT to progress toward unmet goals.    Rehab Potential Good   PT Frequency 2x / week   PT Duration 6 weeks   PT Treatment/Interventions ADLs/Self Care Home Management;Canalith Repostioning;Functional mobility training;Stair training;Gait training;Therapeutic activities;Therapeutic exercise;Balance training;Neuromuscular re-education;Patient/family education;Vestibular   PT Next Visit Plan progress gaze PRN, continue work simulation activities   Consulted and Agree with Plan of Care Patient      Patient will benefit from skilled therapeutic intervention in order to improve the following deficits and impairments:  Abnormal gait, Decreased activity tolerance, Decreased balance, Decreased mobility, Dizziness, Difficulty walking  Visit Diagnosis: Dizziness and giddiness  Other abnormalities of gait and mobility  Unsteadiness on feet     Problem List Patient Active Problem List   Diagnosis Date Noted  . Scalp lesion 08/04/2016  . Cerebellar stroke ([place]) 07/17/2016  . Health maintenance examination 06/17/2015  . Mass of right wrist 06/17/2015  . Hypertriglyceridemia 06/17/2015  . Hypothyroidism (acquired)   . Swelling of left knee joint 10/19/2014  . Calcium pyrophosphate crystal disease 10/19/2014  . Chest pain 03/12/2014  . Olecranon bursitis of right elbow 05/27/2012  . Essential hypertension 08/13/2010  . Alcohol use ([place]) 02/05/2009  . DEPRESSION, SITUATIONAL 02/05/2009  . TOBACCO ABUSE, HX OF 02/05/2009  . HSV 07/24/2008  . ANXIETY 07/24/2008  . INSOMNIA 07/24/2008  . THROAT CANCER 05/30/2008  . SHOULDER PAIN, RIGHT 12/20/2006    Willow Ora, PTA, [facility] Outpatient Neuro  [facility] [address] [place], [place] [address] [phone] 08/14/16, 12:41 PM   Name: Patrick Bailey. MRN: [id number] Date of Birth: [birth year]

## 2016-08-14 NOTE — Telephone Encounter (Signed)
Pt form faxed to [facility] on 08/14/16.

## 2016-08-18 ENCOUNTER — Telehealth: Payer: Self-pay

## 2016-08-18 NOTE — Telephone Encounter (Addendum)
Called pt to r/s tomorrow's appt as office will be closed due to weather. Tentatively scheduled for 1 wk from today. Spoke to pt's father who will have pt call back to reschedule.

## 2016-08-18 NOTE — Telephone Encounter (Signed)
Patrick Bailey with [employer] is calling in reference to disability paperwork.  She is needing clarification on Section D (impairments) with the boxes that were chosen.  Please call

## 2016-08-18 NOTE — Telephone Encounter (Signed)
Attempted to call [facility] but unable to reach anyone. Call was picked up and then disconnected multiple times. Section D on pt's paperwork should state moderate limitation of functional capacity: capable of clerical/adminstrative (sedentary) activity (lifting 10 lbs max). Forms re-faxed F # [id number].

## 2016-08-19 ENCOUNTER — Ambulatory Visit: Payer: 59 | Admitting: Physical Therapy

## 2016-08-19 ENCOUNTER — Ambulatory Visit: Payer: Self-pay | Admitting: Neurology

## 2016-08-21 ENCOUNTER — Ambulatory Visit: Payer: 59 | Admitting: Physical Therapy

## 2016-08-21 ENCOUNTER — Encounter: Payer: 59 | Admitting: Family Medicine

## 2016-08-21 DIAGNOSIS — R2689 Other abnormalities of gait and mobility: Secondary | ICD-10-CM

## 2016-08-21 DIAGNOSIS — R42 Dizziness and giddiness: Secondary | ICD-10-CM

## 2016-08-21 DIAGNOSIS — R2681 Unsteadiness on feet: Secondary | ICD-10-CM

## 2016-08-21 NOTE — Patient Instructions (Signed)
Ischemic Stroke An ischemic stroke is the sudden death of brain tissue. Blood carries oxygen to all areas of the body. This type of stroke happens when your blood does not flow to your brain like normal. Your brain cannot get the oxygen it needs. This is an emergency. It must be treated right away. Symptoms of a stroke usually happen all of a sudden. You may notice them when you wake up. They can include:  Weakness or loss of feeling in your face, arm, or leg. This often happens on one side of the body.  Trouble walking.  Trouble moving your arms or legs.  Loss of balance or coordination.  Feeling confused.  Trouble talking or understanding what people are saying.  Slurred speech.  Trouble seeing.  Seeing two of one object (double vision).  Feeling dizzy.  Feeling sick to your stomach (nauseous) and throwing up (vomiting).  A very bad headache for no reason. Get help as soon as any of these problems start. This is important. Some treatments work better if they are given right away. These include:  Aspirin.  Medicines to control blood pressure.  A shot (injection) of medicine to break up the blood clot.  Treatments given in the blood vessel (artery) to take out the clot or break it up. Other treatments may include:  Oxygen.  Fluids given through an IV tube.  Medicines to thin out your blood.  Procedures to help your blood flow better. What increases the risk? Certain things may make you more likely to have a stroke. Some of these are things that you can change, such as:  Being very overweight (obesity).  Smoking.  Taking birth control pills.  Not being active.  Drinking too much alcohol.  Using drugs. Other risk factors include:  High blood pressure.  High cholesterol.  Diabetes.  Heart disease.  Being African American, Native American, Hispanic, or [place] Native.  Being over age 60.  Family history of stroke.  Having had blood clots, stroke,  or warning stroke (transient ischemic attack, TIA) in the past.  Sickle cell disease.  Being a woman with a history of high blood pressure in pregnancy (preeclampsia).  Migraine headache.  Sleep apnea.  Having an irregular heartbeat (atrial fibrillation).  Long-term (chronic) diseases that cause soreness and swelling (inflammation).  Disorders that affect how your blood clots. Follow these instructions at home: Medicines  Take over-the-counter and prescription medicines only as told by your doctor.  If you were told to take aspirin or another medicine to thin your blood, take it exactly as told by your doctor.  Taking too much of the medicine can cause bleeding.  If you do not take enough, it may not work as well.  Know the side effects of your medicines. If you are taking a blood thinner, make sure you:  Hold pressure over any cuts for longer than usual.  Tell your dentist and other doctors that you take this medicine.  Avoid activities that may cause damage or injury to your body. Eating and drinking  Follow instructions from your doctor about what you cannot eat or drink.  Eat healthy foods.  If you have trouble with swallowing, do these things to avoid choking:  Take small bites when eating.  Eat foods that are soft or pureed. Safety  Follow instructions from your health care team about physical activity.  Use a walker or cane as told by your doctor.  Keep your home safe so you do not fall. This   may include:  Having experts look at your home to make sure it is safe.  Putting grab bars in the bedroom and bathroom.  Using raised toilets.  Putting a seat in the shower. General instructions  Do not use any tobacco products.  Examples of these are cigarettes, chewing tobacco, and e-cigarettes.  If you need help quitting, ask your doctor.  Limit how much alcohol you drink. This means no more than 1 drink a day for nonpregnant women and 2 drinks a day  for men. One drink equals 12 oz of beer, 5 oz of wine, or 1 oz of hard liquor.  If you need help to stop using drugs or alcohol, ask your doctor to refer you to a program or specialist.  Stay active. Exercise as told by your doctor.  Keep all follow-up visits as told by your doctor. This is important. Get help right away if:  You suddenly:  Have weakness or loss of feeling in your face, arm, or leg.  Feel confused.  Have trouble talking or understanding what people are saying.  Have trouble seeing.  Have trouble walking.  Have trouble moving your arms or legs.  Feel dizzy.  Lose your balance or coordination.  Have a very bad headache and you do not know why.  You pass out (lose consciousness) or almost pass out.  You have jerky movements that you cannot control (seizure). These symptoms may be an emergency. Do not wait to see if the symptoms will go away. Get medical help right away. Call your local emergency services (911 in the [place] Do not drive yourself to the hospital.  This information is not intended to replace advice given to you by your health care provider. Make sure you discuss any questions you have with your health care provider. Document Released: 07/09/2011 Document Revised: 12/31/2015 Document Reviewed: 10/16/2015 Elsevier Interactive Patient Education  2017 Elsevier Inc.  

## 2016-08-21 NOTE — Therapy (Signed)
[facility] [address] [place], [place], [address] Phone: [phone]   Fax:  [phone]  Physical Therapy Treatment  Patient Details  Name: Patrick Bailey. MRN: [id number] Date of Birth: 02-Jul-1961 Referring Provider: [name]  Encounter Date: 08/21/2016      PT End of Session - 08/21/16 0840    Visit Number 8   Number of Visits 14   Date for PT Re-Evaluation 09/21/16   Authorization Type UHC   PT Start Time 0758   PT Stop Time 0836   PT Time Calculation (min) 38 min   Activity Tolerance Patient tolerated treatment well   Behavior During Therapy [facility] for tasks assessed/performed      Past Medical History:  Diagnosis Date  . Abnormality of gait 07/17/2016  . Alcohol use   . Arthritis   . Cerebellar stroke ([place]) 07/17/2016   Presumed, causing gait abnormality s/p eval by neuro [name]) 07/2016 overall improving.   Marland Kitchen CPDD (calcium pyrophosphate deposition disease) 10/2013   R hand xray, L knee xray  . GERD (gastroesophageal reflux disease)   . Gout   . History of smoking   . HTN (hypertension)   . Hypertension   . Hypothyroidism (acquired)   . Insomnia   . Throat cancer ([place])    Dr. Caryl Pina [facility])  . TOBACCO ABUSE, HX OF 02/05/2009   Quit 2011      Past Surgical History:  Procedure Laterality Date  . MASS EXCISION Right 03/19/2016   EXCISION LIPOMA RIGHT WRIST;  Surgeon: Leanora Cover, MD  . NECK SURGERY  2010   oral cancer excision - [facility] ([facility])  . SKIN LESION EXCISION  08/2010   Forehead pyogenic granuloma [name]    There were no vitals filed for this visit.      Subjective Assessment - 08/21/16 0801    Subjective doing well, reports only symptoms with getting up too fast.   Pertinent History History of cerebellar CVA, HTN   Patient Stated Goals Pt's goal for therapy is to go back to work.     Currently in Pain? No/denies            [facility] PT Assessment - 08/21/16 0001      Ambulation/Gait   Gait velocity 7.15 sec = 4.59 ft/sec   Stairs Yes   Stairs Assistance 7: Independent   Stairs Assistance Details (indicate cue type and reason) carrying 10#   Stair Management Technique No rails;Alternating pattern   Number of Stairs 4   Height of Stairs 6     Timed Up and Go Test   Normal TUG (seconds) 8.28     Functional Gait  Assessment   Gait Level Surface Walks 20 ft in less than 5.5 sec, no assistive devices, good speed, no evidence for imbalance, normal gait pattern, deviates no more than 6 in outside of the 12 in walkway width.   Change in Gait Speed Able to smoothly change walking speed without loss of balance or gait deviation. Deviate no more than 6 in outside of the 12 in walkway width.   Gait with Horizontal Head Turns Performs head turns smoothly with no change in gait. Deviates no more than 6 in outside 12 in walkway width   Gait with Vertical Head Turns Performs head turns with no change in gait. Deviates no more than 6 in outside 12 in walkway width.   Gait and Pivot Turn Pivot turns safely within 3 sec and stops quickly with no  loss of balance.   Step Over Obstacle Is able to step over 2 stacked shoe boxes taped together (9 in total height) without changing gait speed. No evidence of imbalance.   Gait with Narrow Base of Support Is able to ambulate for 10 steps heel to toe with no staggering.   Gait with Eyes Closed Walks 20 ft, uses assistive device, slower speed, mild gait deviations, deviates 6-10 in outside 12 in walkway width. Ambulates 20 ft in less than 9 sec but greater than 7 sec.   Ambulating Backwards Walks 20 ft, no assistive devices, good speed, no evidence for imbalance, normal gait   Steps Alternating feet, no rail.   Total Score 29         Neuro re-ed: sensory organization test performed with following results: Conditions: 1:  WNL 2:  WNL (slightly below avg trial 2) 3:  Slightly below avg all trials 4: WNL 5: fall x 3 (pt able to  complete 15-18 sec each trial before LOB) 6: below avg trial 1; above avg trials 2 and 3 Composite score:  58 (avg ~ 68%) Sensory Analysis Som: at avg Vis: above avg Vest: shows no input pref:WNL Strategy analysis:      Ankle dominant on condition 5; otherwise WNL COG alignment:      To R                       PT Education - 08/21/16 0840    Education provided Yes   Education Details CVA ed; deferred fall prevention as pt no longer fall risk   Person(s) Educated Patient   Methods Explanation;Handout   Comprehension Verbalized understanding          PT Short Term Goals - 08/21/16 0841      PT SHORT TERM GOAL #1   Title Pt will be independent with HEP for improved balance, functional mobility and gait.  TARGET 08/21/16   Status Achieved     PT SHORT TERM GOAL #2   Title Pt will improve TUG to less than or equal to 13 seconds for decreased fall risk.   Status Achieved     PT SHORT TERM GOAL #3   Title PT will rate dizziness increase less than 2 points during functional activities and gait.   Status Achieved     PT SHORT TERM GOAL #4   Title Pt will verbalize understanding of CVA warning signs and risk factors.   Status Achieved           PT Long Term Goals - 08/21/16 0841      PT LONG TERM GOAL #1   Title Pt will verbalize understanding of fall prevention in home environment.  TARGET 09/21/16   Status Deferred     PT LONG TERM GOAL #2   Title Pt will improve Functional Gait Assessment to at least 20/30 for decreased fall risk, improved funcitonal mobility.   Status Achieved     PT LONG TERM GOAL #3   Title Pt will improve gait velocity to at least 3 ft/sec for improved gait efficiency and safety.   Status Achieved     PT LONG TERM GOAL #4   Title Pt will negotiate 4 steps, no rail, while carrying objects, no LOB, independently for improved stair negotiation in preparation for return to work.   Status Achieved     PT LONG TERM GOAL #5    Title Pt will improve SOT score to >/=70 (or WNL  for his 73-6 year old age group) to improve balance and safety.   Status Not Met               Plan - 08/21/16 0950    Clinical Impression Statement Pt has met LTGs #2,3,4 and deferred LTG #1 as pt is no longer considered high fall risk as indicated by objective measures.  Continues to demonstrate decreased vestibular input for balance as indicated by sensory organization test.  Feel SOT not fully reflective of vestibular input given pt able to tolerate 15-18 sec of all trials of condition 5 prior to losing balance.  Pt to return to work on Monday and requesting d/c at this time.     PT Treatment/Interventions ADLs/Self Care Home Management;Canalith Repostioning;Functional mobility training;Stair training;Gait training;Therapeutic activities;Therapeutic exercise;Balance training;Neuromuscular re-education;Patient/family education;Vestibular   PT Next Visit Plan d/c PT   Consulted and Agree with Plan of Care Patient      Patient will benefit from skilled therapeutic intervention in order to improve the following deficits and impairments:  Abnormal gait, Decreased activity tolerance, Decreased balance, Decreased mobility, Dizziness, Difficulty walking  Visit Diagnosis: Dizziness and giddiness  Other abnormalities of gait and mobility  Unsteadiness on feet     Problem List Patient Active Problem List   Diagnosis Date Noted  . Scalp lesion 08/04/2016  . Cerebellar stroke ([place]) 07/17/2016  . Health maintenance examination 06/17/2015  . Mass of right wrist 06/17/2015  . Hypertriglyceridemia 06/17/2015  . Hypothyroidism (acquired)   . Swelling of left knee joint 10/19/2014  . Calcium pyrophosphate crystal disease 10/19/2014  . Chest pain 03/12/2014  . Olecranon bursitis of right elbow 05/27/2012  . Essential hypertension 08/13/2010  . Alcohol use ([place]) 02/05/2009  . DEPRESSION, SITUATIONAL 02/05/2009  . TOBACCO ABUSE, HX OF  02/05/2009  . HSV 07/24/2008  . ANXIETY 07/24/2008  . INSOMNIA 07/24/2008  . THROAT CANCER 05/30/2008  . SHOULDER PAIN, RIGHT 12/20/2006      Laureen Abrahams, PT, DPT 08/21/16 9:56 AM    [facility] [address] [place], [place], [address] Phone: [phone]   Fax:  [phone]  Name: Patrick Bailey. MRN: [id number] Date of Birth: 1960/09/25          PHYSICAL THERAPY DISCHARGE SUMMARY  Visits from Start of Care: 8  Current functional level related to goals / functional outcomes: See above   Remaining deficits: Pt continues to demonstrate decreased vestibular input with balance.  Has HEP to address and pt reports he feels some symptoms are baseline   Education / Equipment: HEP, CVA ed  Plan: Patient agrees to discharge.  Patient goals were partially met. Patient is being discharged due to being pleased with the current functional level.  ?????     Laureen Abrahams, PT, DPT 08/21/16 9:57 AM   [facility] [address]. [place], [place] [address]  [phone] (office) [phone] (fax)

## 2016-08-25 ENCOUNTER — Ambulatory Visit: Payer: Self-pay | Admitting: Neurology

## 2016-08-26 ENCOUNTER — Ambulatory Visit: Payer: 59 | Admitting: Physical Therapy

## 2016-08-26 ENCOUNTER — Other Ambulatory Visit: Payer: Self-pay | Admitting: Family Medicine

## 2016-08-28 ENCOUNTER — Ambulatory Visit: Payer: 59 | Admitting: Physical Therapy

## 2016-09-01 ENCOUNTER — Other Ambulatory Visit: Payer: Self-pay | Admitting: Family Medicine

## 2016-09-01 NOTE — Telephone Encounter (Signed)
Ok to refill? Not on current med list. 

## 2016-09-04 ENCOUNTER — Other Ambulatory Visit (INDEPENDENT_AMBULATORY_CARE_PROVIDER_SITE_OTHER): Payer: 59

## 2016-09-04 DIAGNOSIS — E781 Pure hyperglyceridemia: Secondary | ICD-10-CM | POA: Diagnosis not present

## 2016-09-04 DIAGNOSIS — R7989 Other specified abnormal findings of blood chemistry: Secondary | ICD-10-CM | POA: Diagnosis not present

## 2016-09-04 DIAGNOSIS — Z79899 Other long term (current) drug therapy: Secondary | ICD-10-CM | POA: Diagnosis not present

## 2016-09-04 LAB — LDL CHOLESTEROL, DIRECT: LDL DIRECT: 117 mg/dL

## 2016-09-04 LAB — LIPID PANEL
Cholesterol: 253 mg/dL — ABNORMAL HIGH (ref 0–200)
HDL: 32.4 mg/dL — ABNORMAL LOW (ref >39.00)
NonHDL: 220.6
Total CHOL/HDL Ratio: 8
Triglycerides: 387 mg/dL — ABNORMAL HIGH (ref 0.0–149.0)
VLDL: 77.4 mg/dL — AB (ref 0.0–40.0)

## 2016-09-04 LAB — TSH: TSH: 0.49 u[IU]/mL (ref 0.35–4.50)

## 2016-09-11 ENCOUNTER — Encounter: Payer: Self-pay | Admitting: Family Medicine

## 2016-09-11 ENCOUNTER — Ambulatory Visit (INDEPENDENT_AMBULATORY_CARE_PROVIDER_SITE_OTHER): Payer: 59 | Admitting: Family Medicine

## 2016-09-11 VITALS — BP 150/96 | HR 88 | Temp 98.2°F | Ht 71.25 in | Wt 204.5 lb

## 2016-09-11 DIAGNOSIS — Z0000 Encounter for general adult medical examination without abnormal findings: Secondary | ICD-10-CM

## 2016-09-11 DIAGNOSIS — Z7289 Other problems related to lifestyle: Secondary | ICD-10-CM

## 2016-09-11 DIAGNOSIS — R2231 Localized swelling, mass and lump, right upper limb: Secondary | ICD-10-CM

## 2016-09-11 DIAGNOSIS — M1120 Other chondrocalcinosis, unspecified site: Secondary | ICD-10-CM | POA: Diagnosis not present

## 2016-09-11 DIAGNOSIS — Z85819 Personal history of malignant neoplasm of unspecified site of lip, oral cavity, and pharynx: Secondary | ICD-10-CM

## 2016-09-11 DIAGNOSIS — Z789 Other specified health status: Secondary | ICD-10-CM | POA: Diagnosis not present

## 2016-09-11 DIAGNOSIS — E039 Hypothyroidism, unspecified: Secondary | ICD-10-CM

## 2016-09-11 DIAGNOSIS — I639 Cerebral infarction, unspecified: Secondary | ICD-10-CM | POA: Diagnosis not present

## 2016-09-11 DIAGNOSIS — E781 Pure hyperglyceridemia: Secondary | ICD-10-CM

## 2016-09-11 DIAGNOSIS — M25462 Effusion, left knee: Secondary | ICD-10-CM

## 2016-09-11 DIAGNOSIS — I10 Essential (primary) hypertension: Secondary | ICD-10-CM

## 2016-09-11 MED ORDER — FENOFIBRATE 160 MG PO TABS: 160.0000 mg | ORAL_TABLET | Freq: Every day | ORAL | 6 refills | Status: DC

## 2016-09-11 MED ORDER — FENOFIBRATE 160 MG PO TABS: 80.0000 mg | ORAL_TABLET | Freq: Every day | ORAL | 6 refills | Status: DC

## 2016-09-11 MED ORDER — AMLODIPINE BESYLATE 10 MG PO TABS: ORAL_TABLET | ORAL | 3 refills | Status: DC

## 2016-09-11 MED ORDER — ATORVASTATIN CALCIUM 40 MG PO TABS: 40.0000 mg | ORAL_TABLET | Freq: Every day | ORAL | 6 refills | Status: DC

## 2016-09-11 MED ORDER — LEVOTHYROXINE SODIUM 150 MCG PO TABS: 150.0000 ug | ORAL_TABLET | Freq: Every day | ORAL | 3 refills | Status: DC

## 2016-09-11 MED ORDER — METOPROLOL TARTRATE 100 MG PO TABS: ORAL_TABLET | ORAL | 3 refills | Status: DC

## 2016-09-11 NOTE — Progress Notes (Signed)
BP (!) 150/96 (BP Location: Right Arm, Cuff Size: Large)   Pulse 88   Temp 98.2 F (36.8 C) (Oral)   Ht 5' 11.25" (1.81 m)   Wt 204 lb 8 oz (92.8 kg)   BMI 28.32 kg/m    CC: CPE Subjective:    Patient ID: Patrick Ochoa., male    DOB: [birth year], 56 y.o.   MRN: [id number]  HPI: Patrick Gaston. is a 56 y.o. male presenting on 09/11/2016 for Annual Exam   Cerebellar CVA 07/2016 affected gait, with mild residual imbalance. Has seen Dr [name] neurology, pending f/u in next few months.   Ongoing L knee swelling/pain for years, worse with going up and down steps and ladders. No locking or instability. Anterior knee pain. Naprosyn doesn't help. Knee brace didn't help. Doesn't endorse inciting trauma/injury  Pseudogout of L knee by imaging - on colchicine daily.   Preventative: Colon cancer screening - discussed, wants colonoscopy. Currently deferred due to recent CVA. Has appt 10/2016 Carlean Purl) Prostate cancer screening - discussed, no fmhx prostate cancer, declines.  Flu shot - declines Tdap 05/2014 Seat belt use discussed Sunscreen use discussed. No changing moles on skin. Ex smoker - quit 2010.  Alcohol - down 3-4 beers/day  Lives alone - split from wife. 1 dog Occ: [employer] Activity: physically active at work Diet: good water, some fruits/vegetables   Relevant past medical, surgical, family and social history reviewed and updated as indicated. Interim medical history since our last visit reviewed. Allergies and medications reviewed and updated. Current Outpatient Prescriptions on File Prior to Visit  Medication Sig  . amitriptyline (ELAVIL) 50 MG tablet TAKE ONE TABLET BY MOUTH AT BEDTIME  . aspirin 325 MG tablet Take 325 mg by mouth daily.  . fish oil-omega-3 fatty acids 1000 MG capsule Take 1 g by mouth daily.    Marland Kitchen GARLIC PO Take by mouth daily.  Marland Kitchen MITIGARE 0.6 MG CAPS TAKE ONE CAPSULE BY MOUTH IN THE MORNING FOR  ONE  WEEK  THEN  AS   NEEDED  . omeprazole (PRILOSEC) 20 MG capsule Take 20 mg by mouth daily.   No current facility-administered medications on file prior to visit.     Review of Systems  Constitutional: Negative for activity change, appetite change, chills, fatigue, fever and unexpected weight change.  HENT: Negative for hearing loss.   Eyes: Negative for visual disturbance.  Respiratory: Negative for cough, chest tightness, shortness of breath and wheezing.   Cardiovascular: Negative for chest pain, palpitations and leg swelling.  Gastrointestinal: Negative for abdominal distention, abdominal pain, blood in stool, constipation, diarrhea, nausea and vomiting.  Genitourinary: Negative for difficulty urinating and hematuria.  Musculoskeletal: Negative for arthralgias, myalgias and neck pain.  Skin: Negative for rash.  Neurological: Positive for headaches. Negative for dizziness, seizures and syncope.  Hematological: Negative for adenopathy. Does not bruise/bleed easily.  Psychiatric/Behavioral: Negative for dysphoric mood. The patient is not nervous/anxious.    Per HPI unless specifically indicated in ROS section     Objective:    BP (!) 150/96 (BP Location: Right Arm, Cuff Size: Large)   Pulse 88   Temp 98.2 F (36.8 C) (Oral)   Ht 5' 11.25" (1.81 m)   Wt 204 lb 8 oz (92.8 kg)   BMI 28.32 kg/m   Wt Readings from Last 3 Encounters:  09/11/16 204 lb 8 oz (92.8 kg)  08/04/16 208 lb 8 oz (94.6 kg)  07/17/16 201 lb 12  oz (91.5 kg)    Physical Exam  Constitutional: He is oriented to person, place, and time. He appears well-developed and well-nourished. No distress.  HENT:  Head: Normocephalic and atraumatic.  Right Ear: Hearing, tympanic membrane, external ear and ear canal normal.  Left Ear: Hearing, tympanic membrane, external ear and ear canal normal.  Nose: Nose normal.  Mouth/Throat: Uvula is midline, oropharynx is clear and moist and mucous membranes are normal. No oropharyngeal exudate,  posterior oropharyngeal edema or posterior oropharyngeal erythema.  Eyes: Conjunctivae and EOM are normal. Pupils are equal, round, and reactive to light. No scleral icterus.  Neck: Normal range of motion. Neck supple. No thyromegaly present.  Cardiovascular: Normal rate, regular rhythm, normal heart sounds and intact distal pulses.   No murmur heard. Pulses:      Radial pulses are 2+ on the right side, and 2+ on the left side.  Pulmonary/Chest: Effort normal and breath sounds normal. No respiratory distress. He has no wheezes. He has no rales.  Abdominal: Soft. Bowel sounds are normal. He exhibits no distension and no mass. There is no tenderness. There is no rebound and no guarding.  Musculoskeletal: Normal range of motion. He exhibits no edema.  R knee WNL L Knee exam: No deformity on inspection. No pain with palpation of knee landmarks. Swelling medial leg proximal to knee  FROM in flex/extension with crepitus. No popliteal fullness. Neg drawer test. Neg mcmurray test. No pain with valgus/varus stress. No PFgrind. No abnormal patellar mobility.   Lymphadenopathy:    He has no cervical adenopathy.  Neurological: He is alert and oriented to person, place, and time.  CN grossly intact, station and gait intact  Skin: Skin is warm and dry. No rash noted.  Psychiatric: He has a normal mood and affect. His behavior is normal. Judgment and thought content normal.  Nursing note and vitals reviewed.  Results for orders placed or performed in visit on 09/04/16  Lipid panel  Result Value Ref Range   Cholesterol 253 (H) 0 - 200 mg/dL   Triglycerides 387.0 (H) 0.0 - 149.0 mg/dL   HDL 32.40 (L) >39.00 mg/dL   VLDL 77.4 (H) 0.0 - 40.0 mg/dL   Total CHOL/HDL Ratio 8    NonHDL 220.60   TSH  Result Value Ref Range   TSH 0.49 0.35 - 4.50 uIU/mL  LDL cholesterol, direct  Result Value Ref Range   Direct LDL 117.0 mg/dL      Assessment & Plan:   Problem List Items Addressed This Visit      Alcohol use ([place])    Continues to cut down from 6 beers/day to 3-4. Encouraged continued titration.       Calcium pyrophosphate crystal disease    Clinical diagnosis and based on imaging findings (hand, knee xray).. Colchicine has significantly helped.  Will continue.       Cerebellar stroke ([place])    07/2016 Continue full dose aspirin, add lipitor - hopeful to be able to come off fibate.  He did undergo vestibular PT for balance training.  Continue f/u with [facility]      Relevant Medications   atorvastatin (LIPITOR) 40 MG tablet   metoprolol (LOPRESSOR) 100 MG tablet   amLODipine (NORVASC) 10 MG tablet   fenofibrate 160 MG tablet   Essential hypertension    Chronic, mildly elevated today despite compliance with amlodipine 10mg  qd and metoprolol 100mg  bid.  Discussed low salt diet, increased potassium rich foods.  No med changes today, reassess at f/u.  Relevant Medications   atorvastatin (LIPITOR) 40 MG tablet   metoprolol (LOPRESSOR) 100 MG tablet   amLODipine (NORVASC) 10 MG tablet   fenofibrate 160 MG tablet   Health maintenance examination - Primary    Preventative protocols reviewed and updated unless pt declined. Discussed healthy diet and lifestyle.       History of throat cancer    Yearly onc f/u.       Hypertriglyceridemia    lipitor changed to fibrate 10/2015. He did run out of fibrate for 1 wk.  LDL deteriorated - will restart lipitor, decrease fenofibrate to 80mg  daily (1/2 tab of 160mg ). Continue fish oil which patient states is helpful for joints.  RTC 3 mo lab visit recheck.       Relevant Medications   atorvastatin (LIPITOR) 40 MG tablet   metoprolol (LOPRESSOR) 100 MG tablet   amLODipine (NORVASC) 10 MG tablet   fenofibrate 160 MG tablet   Other Relevant Orders   Lipid panel   Comprehensive metabolic panel   Hypothyroidism (acquired)    Chronic, continue current levothyroxine dose.      Relevant Medications   metoprolol (LOPRESSOR)  100 MG tablet   levothyroxine (SYNTHROID, LEVOTHROID) 150 MCG tablet   Mass of right wrist    S/p lipoma excision by Dr Fredna Dow (03/2016), worsening wrist pain at site of surgery. Monitor for now.       Swelling of left knee joint    No palpable lipoma.  Chronic persistent effusion, in setting of presumed CPPD.  Bothersome to patient but not painful. Asks about aspiration.  Provided with exercises for PFPS in case this is contributing.  If no better, consider MRI vs ortho referral.           Follow up plan: Return in about 6 months (around 03/11/2017) for follow up visit.  Ria Bush, MD

## 2016-09-11 NOTE — Progress Notes (Signed)
Pre visit review using our clinic review tool, if applicable. No additional management support is needed unless otherwise documented below in the visit note. 

## 2016-09-11 NOTE — Patient Instructions (Addendum)
Continue current medicines, add lipitor 40mg  daily and return in 3 months for lab visit only to recheck cholesterol. Decrease fenofibrate to 80mg  daily (1/2 tablet) Try exercises for knee pain. If no better, we will refer you to knee doctor.  Good to see you today, call us with questions. Return in 6 months for follow up visit.   Health Maintenance, Male A healthy lifestyle and preventative care can promote health and wellness.  Maintain regular health, dental, and eye exams.  Eat a healthy diet. Foods like vegetables, fruits, whole grains, low-fat dairy products, and lean protein foods contain the nutrients you need and are low in calories. Decrease your intake of foods high in solid fats, added sugars, and salt. Get information about a proper diet from your health care provider, if necessary.  Regular physical exercise is one of the most important things you can do for your health. Most adults should get at least 150 minutes of moderate-intensity exercise (any activity that increases your heart rate and causes you to sweat) each week. In addition, most adults need muscle-strengthening exercises on 2 or more days a week.   Maintain a healthy weight. The body mass index (BMI) is a screening tool to identify possible weight problems. It provides an estimate of body fat based on height and weight. Your health care provider can find your BMI and can help you achieve or maintain a healthy weight. For males 20 years and older:  A BMI below 18.5 is considered underweight.  A BMI of 18.5 to 24.9 is normal.  A BMI of 25 to 29.9 is considered overweight.  A BMI of 30 and above is considered obese.  Maintain normal blood lipids and cholesterol by exercising and minimizing your intake of saturated fat. Eat a balanced diet with plenty of fruits and vegetables. Blood tests for lipids and cholesterol should begin at age 34 and be repeated every 5 years. If your lipid or cholesterol levels are high, you  are over age 65, or you are at high risk for heart disease, you may need your cholesterol levels checked more frequently.Ongoing high lipid and cholesterol levels should be treated with medicines if diet and exercise are not working.  If you smoke, find out from your health care provider how to quit. If you do not use tobacco, do not start.  Lung cancer screening is recommended for adults aged 53-80 years who are at high risk for developing lung cancer because of a history of smoking. A yearly low-dose CT scan of the lungs is recommended for people who have at least a 30-pack-year history of smoking and are current smokers or have quit within the past 15 years. A pack year of smoking is smoking an average of 1 pack of cigarettes a day for 1 year (for example, a 30-pack-year history of smoking could mean smoking 1 pack a day for 30 years or 2 packs a day for 15 years). Yearly screening should continue until the smoker has stopped smoking for at least 15 years. Yearly screening should be stopped for people who develop a health problem that would prevent them from having lung cancer treatment.  If you choose to drink alcohol, do not have more than 2 drinks per day. One drink is considered to be 12 oz (360 mL) of beer, 5 oz (150 mL) of wine, or 1.5 oz (45 mL) of liquor.  Avoid the use of street drugs. Do not share needles with anyone. Ask for help if you need  support or instructions about stopping the use of drugs.  High blood pressure causes heart disease and increases the risk of stroke. High blood pressure is more likely to develop in:  People who have blood pressure in the end of the normal range (100-139/85-89 mm Hg).  People who are overweight or obese.  People who are African American.  If you are 91-55 years of age, have your blood pressure checked every 3-5 years. If you are 63 years of age or older, have your blood pressure checked every year. You should have your blood pressure measured  twice-once when you are at a hospital or clinic, and once when you are not at a hospital or clinic. Record the average of the two measurements. To check your blood pressure when you are not at a hospital or clinic, you can use:  An automated blood pressure machine at a pharmacy.  A home blood pressure monitor.  If you are 38-45 years old, ask your health care provider if you should take aspirin to prevent heart disease.  Diabetes screening involves taking a blood sample to check your fasting blood sugar level. This should be done once every 3 years after age 4 if you are at a normal weight and without risk factors for diabetes. Testing should be considered at a younger age or be carried out more frequently if you are overweight and have at least 1 risk factor for diabetes.  Colorectal cancer can be detected and often prevented. Most routine colorectal cancer screening begins at the age of 44 and continues through age 2. However, your health care provider may recommend screening at an earlier age if you have risk factors for colon cancer. On a yearly basis, your health care provider may provide home test kits to check for hidden blood in the stool. A small camera at the end of a tube may be used to directly examine the colon (sigmoidoscopy or colonoscopy) to detect the earliest forms of colorectal cancer. Talk to your health care provider about this at age 81 when routine screening begins. A direct exam of the colon should be repeated every 5-10 years through age 75, unless early forms of precancerous polyps or small growths are found.  People who are at an increased risk for hepatitis B should be screened for this virus. You are considered at high risk for hepatitis B if:  You were born in a country where hepatitis B occurs often. Talk with your health care provider about which countries are considered high risk.  Your parents were born in a high-risk country and you have not received a shot to  protect against hepatitis B (hepatitis B vaccine).  You have HIV or AIDS.  You use needles to inject street drugs.  You live with, or have sex with, someone who has hepatitis B.  You are a man who has sex with other men (MSM).  You get hemodialysis treatment.  You take certain medicines for conditions like cancer, organ transplantation, and autoimmune conditions.  Hepatitis C blood testing is recommended for all people born from [birth year] through 1966 and any individual with known risk factors for hepatitis C.  Healthy men should no longer receive prostate-specific antigen (PSA) blood tests as part of routine cancer screening. Talk to your health care provider about prostate cancer screening.  Testicular cancer screening is not recommended for adolescents or adult males who have no symptoms. Screening includes self-exam, a health care provider exam, and other screening tests. Consult with your  health care provider about any symptoms you have or any concerns you have about testicular cancer.  Practice safe sex. Use condoms and avoid high-risk sexual practices to reduce the spread of sexually transmitted infections (STIs).  You should be screened for STIs, including gonorrhea and chlamydia if:  You are sexually active and are younger than 24 years.  You are older than 24 years, and your health care provider tells you that you are at risk for this type of infection.  Your sexual activity has changed since you were last screened, and you are at an increased risk for chlamydia or gonorrhea. Ask your health care provider if you are at risk.  If you are at risk of being infected with HIV, it is recommended that you take a prescription medicine daily to prevent HIV infection. This is called pre-exposure prophylaxis (PrEP). You are considered at risk if:  You are a man who has sex with other men (MSM).  You are a heterosexual man who is sexually active with multiple partners.  You take drugs by  injection.  You are sexually active with a partner who has HIV.  Talk with your health care provider about whether you are at high risk of being infected with HIV. If you choose to begin PrEP, you should first be tested for HIV. You should then be tested every 3 months for as long as you are taking PrEP.  Use sunscreen. Apply sunscreen liberally and repeatedly throughout the day. You should seek shade when your shadow is shorter than you. Protect yourself by wearing long sleeves, pants, a wide-brimmed hat, and sunglasses year round whenever you are outdoors.  Tell your health care provider of new moles or changes in moles, especially if there is a change in shape or color. Also, tell your health care provider if a mole is larger than the size of a pencil eraser.  A one-time screening for abdominal aortic aneurysm (AAA) and surgical repair of large AAAs by ultrasound is recommended for men aged 60-75 years who are current or former smokers.  Stay current with your vaccines (immunizations). This information is not intended to replace advice given to you by your health care provider. Make sure you discuss any questions you have with your health care provider. Document Released: 01/16/2008 Document Revised: 08/10/2014 Document Reviewed: 04/23/2015 Elsevier Interactive Patient Education  2017 [employer].

## 2016-09-11 NOTE — Assessment & Plan Note (Signed)
Preventative protocols reviewed and updated unless pt declined. Discussed healthy diet and lifestyle.  

## 2016-09-12 NOTE — Assessment & Plan Note (Signed)
Chronic, mildly elevated today despite compliance with amlodipine 10mg  qd and metoprolol 100mg  bid.  Discussed low salt diet, increased potassium rich foods.  No med changes today, reassess at f/u.

## 2016-09-12 NOTE — Assessment & Plan Note (Addendum)
S/p lipoma excision by Dr Fredna Dow (03/2016), worsening wrist pain at site of surgery. Monitor for now.

## 2016-09-12 NOTE — Assessment & Plan Note (Addendum)
07/2016 Continue full dose aspirin, add lipitor - hopeful to be able to come off fibate.  He did undergo vestibular PT for balance training.  Continue f/u with [name]

## 2016-09-12 NOTE — Assessment & Plan Note (Signed)
Continues to cut down from 6 beers/day to 3-4. Encouraged continued titration.

## 2016-09-12 NOTE — Assessment & Plan Note (Signed)
Yearly onc f/u.

## 2016-09-12 NOTE — Assessment & Plan Note (Addendum)
lipitor changed to fibrate 10/2015. He did run out of fibrate for 1 wk.  LDL deteriorated - will restart lipitor, decrease fenofibrate to 80mg  daily (1/2 tab of 160mg ). Continue fish oil which patient states is helpful for joints.  RTC 3 mo lab visit recheck.

## 2016-09-12 NOTE — Assessment & Plan Note (Signed)
Chronic, continue current levothyroxine dose.

## 2016-09-12 NOTE — Assessment & Plan Note (Signed)
No palpable lipoma.  Chronic persistent effusion, in setting of presumed CPPD.  Bothersome to patient but not painful. Asks about aspiration.  Provided with exercises for PFPS in case this is contributing.  If no better, consider MRI vs ortho referral.

## 2016-09-12 NOTE — Assessment & Plan Note (Addendum)
Clinical diagnosis and based on imaging findings (hand, knee xray).. Colchicine has significantly helped.  Will continue.

## 2016-09-28 ENCOUNTER — Ambulatory Visit: Payer: Self-pay | Admitting: Neurology

## 2016-09-28 ENCOUNTER — Telehealth: Payer: Self-pay | Admitting: Neurology

## 2016-09-28 NOTE — Telephone Encounter (Signed)
This patient did not show for a RV appointment today. 

## 2016-10-01 ENCOUNTER — Encounter: Payer: Self-pay | Admitting: Neurology

## 2016-10-02 ENCOUNTER — Ambulatory Visit: Payer: 59 | Admitting: Internal Medicine

## 2016-10-05 ENCOUNTER — Telehealth: Payer: Self-pay

## 2016-10-05 NOTE — Telephone Encounter (Signed)
No show letter from missed 10/02/16 visit mailed to patient.

## 2016-10-09 ENCOUNTER — Encounter (INDEPENDENT_AMBULATORY_CARE_PROVIDER_SITE_OTHER): Payer: Self-pay

## 2016-10-09 ENCOUNTER — Encounter: Payer: Self-pay | Admitting: Family Medicine

## 2016-10-09 ENCOUNTER — Ambulatory Visit (INDEPENDENT_AMBULATORY_CARE_PROVIDER_SITE_OTHER)
Admission: RE | Admit: 2016-10-09 | Discharge: 2016-10-09 | Disposition: A | Discharge status: Home / Self Care | Payer: 59 | Source: Ambulatory Visit | Attending: Family Medicine | Admitting: Family Medicine

## 2016-10-09 ENCOUNTER — Ambulatory Visit (INDEPENDENT_AMBULATORY_CARE_PROVIDER_SITE_OTHER): Payer: 59 | Admitting: Family Medicine

## 2016-10-09 VITALS — BP 120/84 | HR 78 | Temp 97.6°F | Ht 71.25 in | Wt 205.5 lb

## 2016-10-09 DIAGNOSIS — S62524A Nondisplaced fracture of distal phalanx of right thumb, initial encounter for closed fracture: Secondary | ICD-10-CM | POA: Diagnosis not present

## 2016-10-09 DIAGNOSIS — M79644 Pain in right finger(s): Secondary | ICD-10-CM

## 2016-10-09 DIAGNOSIS — S62521A Displaced fracture of distal phalanx of right thumb, initial encounter for closed fracture: Secondary | ICD-10-CM | POA: Diagnosis not present

## 2016-10-09 DIAGNOSIS — S6702XA Crushing injury of left thumb, initial encounter: Secondary | ICD-10-CM

## 2016-10-09 DIAGNOSIS — S6701XA Crushing injury of right thumb, initial encounter: Secondary | ICD-10-CM

## 2016-10-09 DIAGNOSIS — S61111A Laceration without foreign body of right thumb with damage to nail, initial encounter: Secondary | ICD-10-CM | POA: Diagnosis not present

## 2016-10-09 HISTORY — DX: Nondisplaced fracture of distal phalanx of right thumb, initial encounter for closed fracture: S62.524A

## 2016-10-09 NOTE — Progress Notes (Signed)
   Subjective:    Patient ID: Patrick Ochoa., male    DOB: July 06, 1961, 56 y.o.   MRN: [id number]  HPI  56 year old male pt of Dr. Darnell Level presents following steel pipe falling on right thumb 5 days ago. He reports  feell directly on nail bed on right thumb. Immediate swelling of tip of thumb, severe pain He has continue to work.  He has noted swelling, bleeding at medial nail edge.  Hears popping when bends thumb. Swelling ans pain now is decreasing slightly.   Did not put any ice on it. Did clean with peroxide.  No fever. No spreading redness.  He has noted numbness in tip of thumb presents since injury.. Thumb tip feels slightly cold to him.   Review of Systems  Constitutional: Negative for fatigue.  HENT: Negative for ear pain.   Eyes: Negative for pain.  Respiratory: Negative for shortness of breath and wheezing.   Cardiovascular: Negative for chest pain, palpitations and leg swelling.  Gastrointestinal: Negative for abdominal distention.       Objective:   Physical Exam  Constitutional: Vital signs are normal. He appears well-developed and well-nourished.   Very dirty hands  HENT:  Head: Normocephalic.  Right Ear: Hearing normal.  Left Ear: Hearing normal.  Nose: Nose normal.  Mouth/Throat: Oropharynx is clear and moist and mucous membranes are normal.  Neck: Trachea normal. Carotid bruit is not present. No thyroid mass and no thyromegaly present.  Cardiovascular: Normal rate, regular rhythm and normal pulses.  Exam reveals no gallop, no distant heart sounds and no friction rub.   No murmur heard. No peripheral edema  Pulmonary/Chest: Effort normal and breath sounds normal. No respiratory distress.  Musculoskeletal:  No wrist or hand ttp  Pain in distal right first phalanx, contusion and slight amount of bleeding at nail edge. Decrease range of motion of thumb,  Decreased sensation at thumb tip.  Skin: Skin is warm, dry and intact. No rash noted.  Psychiatric:  He has a normal mood and affect. His speech is normal and behavior is normal. Thought content normal.          Assessment & Plan:

## 2016-10-09 NOTE — Assessment & Plan Note (Signed)
Referred to hand surgeon for management ASAP.

## 2016-10-09 NOTE — Patient Instructions (Signed)
Please stop at the front desk to set up referral.  

## 2016-10-09 NOTE — Progress Notes (Signed)
Pre visit review using our clinic review tool, if applicable. No additional management support is needed unless otherwise documented below in the visit note. 

## 2016-10-16 DIAGNOSIS — S61111A Laceration without foreign body of right thumb with damage to nail, initial encounter: Secondary | ICD-10-CM | POA: Diagnosis not present

## 2016-10-16 DIAGNOSIS — S62524A Nondisplaced fracture of distal phalanx of right thumb, initial encounter for closed fracture: Secondary | ICD-10-CM | POA: Diagnosis not present

## 2016-10-23 DIAGNOSIS — S61111D Laceration without foreign body of right thumb with damage to nail, subsequent encounter: Secondary | ICD-10-CM | POA: Diagnosis not present

## 2016-10-23 DIAGNOSIS — S62524D Nondisplaced fracture of distal phalanx of right thumb, subsequent encounter for fracture with routine healing: Secondary | ICD-10-CM | POA: Diagnosis not present

## 2016-12-11 ENCOUNTER — Other Ambulatory Visit (INDEPENDENT_AMBULATORY_CARE_PROVIDER_SITE_OTHER): Payer: 59

## 2016-12-11 ENCOUNTER — Encounter: Payer: Self-pay | Admitting: Internal Medicine

## 2016-12-11 ENCOUNTER — Telehealth: Payer: Self-pay

## 2016-12-11 ENCOUNTER — Ambulatory Visit (INDEPENDENT_AMBULATORY_CARE_PROVIDER_SITE_OTHER): Payer: 59 | Admitting: Internal Medicine

## 2016-12-11 VITALS — BP 128/84 | HR 86 | Temp 98.1°F | Wt 197.0 lb

## 2016-12-11 DIAGNOSIS — M25462 Effusion, left knee: Secondary | ICD-10-CM | POA: Diagnosis not present

## 2016-12-11 DIAGNOSIS — M25562 Pain in left knee: Secondary | ICD-10-CM | POA: Diagnosis not present

## 2016-12-11 DIAGNOSIS — E781 Pure hyperglyceridemia: Secondary | ICD-10-CM | POA: Diagnosis not present

## 2016-12-11 LAB — COMPREHENSIVE METABOLIC PANEL
ALBUMIN: 4.4 g/dL (ref 3.5–5.2)
ALT: 52 U/L (ref 0–53)
AST: 34 U/L (ref 0–37)
Alkaline Phosphatase: 69 U/L (ref 39–117)
BILIRUBIN TOTAL: 0.4 mg/dL (ref 0.2–1.2)
BUN: 13 mg/dL (ref 6–23)
CALCIUM: 9.5 mg/dL (ref 8.4–10.5)
CO2: 30 mEq/L (ref 19–32)
CREATININE: 1.07 mg/dL (ref 0.40–1.50)
Chloride: 102 mEq/L (ref 96–112)
GFR: 75.8 mL/min (ref >60.00)
Glucose, Bld: 98 mg/dL (ref 70–99)
Potassium: 4.6 mEq/L (ref 3.5–5.1)
Sodium: 137 mEq/L (ref 135–145)
Total Protein: 7.1 g/dL (ref 6.0–8.3)

## 2016-12-11 LAB — LIPID PANEL
Cholesterol: 135 mg/dL (ref 0–200)
HDL: 31 mg/dL — ABNORMAL LOW (ref >39.00)
Total CHOL/HDL Ratio: 4

## 2016-12-11 LAB — LDL CHOLESTEROL, DIRECT: Direct LDL: 37 mg/dL

## 2016-12-11 NOTE — Progress Notes (Signed)
Subjective:    Patient ID: Patrick Ochoa., male    DOB: Mar 16, 1961, 56 y.o.   MRN: [id number]  HPI  Pt presents to the clinic today with c/o pain and swelling of his left knee. He noticed this 1 year ago, but reports the pain has progressively gotten worse until the point it is occurring daily. He describes the pain as throbbing. The pain does not radiate. He occasionally hears it "pop". It is interfering with his gait. It is swollen all the time. He denies redness or warmth of the knee. He denies recent fall or injury to the knee. He had an xray of his left knee 10/2014, which showed:  IMPRESSION: 1. Moderate joint effusion without an acute osseous abnormality. Ligamentous injury is not excluded. 2. Chondrocalcinosis suggesting CPPD arthropathy. 3. Increasing size of loose bodies. 4. Mild degenerative changes have progressed.  He has intermittently worn a knee brace which has helped with the swelling. He has tried Tylenol and Ibuprofen with minimal relief.  Review of Systems      Past Medical History:  Diagnosis Date  . Abnormality of gait 07/17/2016  . Alcohol use   . Arthritis   . Cerebellar stroke ([place]) 07/17/2016   Presumed, causing gait abnormality s/p eval by neuro [name]) 07/2016 overall improving.   Marland Kitchen CPDD (calcium pyrophosphate deposition disease) 10/2013   R hand xray, L knee xray  . GERD (gastroesophageal reflux disease)   . Gout   . History of smoking   . HTN (hypertension)   . Hypertension   . Hypothyroidism (acquired)   . Insomnia   . Throat cancer ([place])    Dr. Caryl Pina [facility])  . TOBACCO ABUSE, HX OF 02/05/2009   Quit 2011      Current Outpatient Prescriptions  Medication Sig Dispense Refill  . amitriptyline (ELAVIL) 50 MG tablet TAKE ONE TABLET BY MOUTH AT BEDTIME 30 tablet 11  . amLODipine (NORVASC) 10 MG tablet TAKE ONE TABLET BY MOUTH ONCE DAILY 90 tablet 3  . aspirin 325 MG tablet Take 325 mg by mouth daily.    Marland Kitchen atorvastatin (LIPITOR) 40 MG  tablet Take 1 tablet (40 mg total) by mouth daily. 30 tablet 6  . DENTAGEL 1.1 % GEL dental gel     . fenofibrate 160 MG tablet Take 0.5 tablets (80 mg total) by mouth daily. 30 tablet 6  . fish oil-omega-3 fatty acids 1000 MG capsule Take 1 g by mouth daily.      Marland Kitchen GARLIC PO Take by mouth daily.    Marland Kitchen levothyroxine (SYNTHROID, LEVOTHROID) 150 MCG tablet Take 1 tablet (150 mcg total) by mouth daily. 90 tablet 3  . metoprolol (LOPRESSOR) 100 MG tablet TAKE ONE TABLET BY MOUTH TWICE DAILY 180 tablet 3  . MITIGARE 0.6 MG CAPS TAKE ONE CAPSULE BY MOUTH IN THE MORNING FOR  ONE  WEEK  THEN  AS  NEEDED 30 capsule 3  . omeprazole (PRILOSEC) 20 MG capsule Take 20 mg by mouth daily.     No current facility-administered medications for this visit.     No Known Allergies  Family History  Problem Relation Age of Onset  . Coronary artery disease Father        MI  . Hypertension Father   . Stroke Father   . Cancer Mother   . Cancer Brother        Brain    Social History   Social History  . Marital status: Single  Spouse name: N/A  . Number of children: 0  . Years of education: N/A   Occupational History  . Self employed Nurse, children's   Social History Main Topics  . Smoking status: Former Smoker    Packs/day: 0.50    Years: 15.00    Types: Cigarettes    Quit date: 08/03/2008  . Smokeless tobacco: Never Used  . Alcohol use 3.6 oz/week    6 Cans of beer per week     Comment: 6 pack per day  . Drug use: No  . Sexual activity: Not on file   Other Topics Concern  . Not on file   Social History Narrative   Lives alone - split from wife. 1 dog   Occ: [employer]   Activity: stays active at work, started weight lifting   Diet: good water, some fruits/vegetables    Right-handed but does a lot of things left-handed   Caffeine: 2 cups per day     Constitutional: Denies fever, malaise, fatigue, headache or abrupt weight changes.  Musculoskeletal: Pt  reports knee pain and swelling. Denies decrease in range of motion,  muscle pain.  Skin: Denies redness, rashes, lesions or ulcercations.    No other specific complaints in a complete review of systems (except as listed in HPI above).  Objective:   Physical Exam  BP 128/84   Pulse 86   Temp 98.1 F (36.7 C) (Oral)   Wt 197 lb (89.4 kg)   SpO2 98%   BMI 27.28 kg/m  Wt Readings from Last 3 Encounters:  12/11/16 197 lb (89.4 kg)  10/09/16 205 lb 8 oz (93.2 kg)  09/11/16 204 lb 8 oz (92.8 kg)    General: Appears his stated age, in NAD. Musculoskeletal: Normal flexion, and extension of the knee. Pain with palpation over the medical joint line. Large effusion of the distal, medial thigh. Positive Lachman's. Negative Mcmurray.  He is able to walk on heels and toes. He is limping with normal gait.  BMET    Component Value Date/Time   NA 137 12/11/2016 0829   NA 137 03/11/2014 1025   K 4.6 12/11/2016 0829   K 4.0 03/11/2014 1025   CL 102 12/11/2016 0829   CL 104 03/11/2014 1025   CO2 30 12/11/2016 0829   CO2 25 03/11/2014 1025   GLUCOSE 98 12/11/2016 0829   GLUCOSE 132 (H) 03/11/2014 1025   BUN 13 12/11/2016 0829   BUN 15 03/11/2014 1025   CREATININE 1.07 12/11/2016 0829   CREATININE 1.02 08/10/2014 1644   CALCIUM 9.5 12/11/2016 0829   CALCIUM 8.6 03/11/2014 1025   GFRNONAA >60 07/11/2016 0928   GFRNONAA >60 03/11/2014 1025   GFRAA >60 07/11/2016 0928   GFRAA >60 03/11/2014 1025    Lipid Panel     Component Value Date/Time   CHOL 135 12/11/2016 0829   CHOL 231 (H) 03/16/2014 0809   TRIG (H) 12/11/2016 0829    576.0 Triglyceride is over 400; calculations on Lipids are invalid.   HDL 31.00 (L) 12/11/2016 0829   HDL 40 03/16/2014 0809   CHOLHDL 4 12/11/2016 0829   VLDL 77.4 (H) 09/04/2016 0940   LDLCALC 123 (H) 03/16/2014 0809    CBC    Component Value Date/Time   WBC 6.0 07/11/2016 0928   RBC 4.68 07/11/2016 0928   HGB 15.4 07/11/2016 0928   HGB 14.8  03/11/2014 1025   HCT 43.7 07/11/2016 0928   HCT 44.0 03/11/2014 1025  PLT 256 07/11/2016 0928   PLT 200 03/11/2014 1025   MCV 93.4 07/11/2016 0928   MCV 98 03/11/2014 1025   MCH 33.0 07/11/2016 0928   MCHC 35.3 07/11/2016 0928   RDW 12.3 07/11/2016 0928   RDW 12.2 03/11/2014 1025   LYMPHSABS 2.1 08/10/2014 1644   MONOABS 0.6 08/10/2014 1644   EOSABS 0.2 08/10/2014 1644   BASOSABS 0.1 08/10/2014 1644    Hgb A1C No results found for: HGBA1C         Assessment & Plan:   Left Knee Pain:  Concern for meniscal or ligament tear MRI of left knee ordered Advised him to start wearing the compression brace again Can use ice and Ibuprofen for pain and swelling May need PT vs ortho referral  Will follow up after MRI, RTC as needed Webb Silversmith, NP

## 2016-12-11 NOTE — Patient Instructions (Signed)

## 2016-12-11 NOTE — Telephone Encounter (Signed)
Pt has empty bottle of metoprolol and request refill; # 180 x 3 available refills at [address].  Sybil at [facility] will get rx ready for pick up. Pt voiced understanding.

## 2016-12-12 ENCOUNTER — Other Ambulatory Visit: Payer: Self-pay | Admitting: Family Medicine

## 2016-12-12 MED ORDER — FENOFIBRATE 160 MG PO TABS: 160.0000 mg | ORAL_TABLET | Freq: Every day | ORAL | 3 refills | Status: DC

## 2016-12-12 MED ORDER — ATORVASTATIN CALCIUM 40 MG PO TABS: 40.0000 mg | ORAL_TABLET | Freq: Every day | ORAL | 3 refills | Status: DC

## 2016-12-14 ENCOUNTER — Encounter: Payer: Self-pay | Admitting: *Deleted

## 2016-12-25 ENCOUNTER — Ambulatory Visit
Admission: RE | Admit: 2016-12-25 | Discharge: 2016-12-25 | Disposition: A | Discharge status: Home / Self Care | Payer: 59 | Source: Ambulatory Visit | Attending: Internal Medicine | Admitting: Internal Medicine

## 2016-12-25 DIAGNOSIS — M2342 Loose body in knee, left knee: Secondary | ICD-10-CM | POA: Insufficient documentation

## 2016-12-25 DIAGNOSIS — M1712 Unilateral primary osteoarthritis, left knee: Secondary | ICD-10-CM | POA: Insufficient documentation

## 2016-12-25 DIAGNOSIS — M25462 Effusion, left knee: Secondary | ICD-10-CM | POA: Insufficient documentation

## 2016-12-25 DIAGNOSIS — M25562 Pain in left knee: Secondary | ICD-10-CM

## 2017-01-01 ENCOUNTER — Other Ambulatory Visit: Payer: Self-pay | Admitting: Internal Medicine

## 2017-01-01 DIAGNOSIS — M1712 Unilateral primary osteoarthritis, left knee: Secondary | ICD-10-CM

## 2017-01-15 ENCOUNTER — Other Ambulatory Visit: Payer: Self-pay | Admitting: Family Medicine

## 2017-01-29 DIAGNOSIS — M1712 Unilateral primary osteoarthritis, left knee: Secondary | ICD-10-CM | POA: Diagnosis not present

## 2017-01-29 DIAGNOSIS — M25562 Pain in left knee: Secondary | ICD-10-CM | POA: Diagnosis not present

## 2017-03-12 ENCOUNTER — Ambulatory Visit: Payer: 59 | Admitting: Family Medicine

## 2017-03-19 ENCOUNTER — Other Ambulatory Visit: Payer: Self-pay | Admitting: Family Medicine

## 2017-04-01 ENCOUNTER — Encounter: Payer: Self-pay | Admitting: Family Medicine

## 2017-04-01 ENCOUNTER — Ambulatory Visit (INDEPENDENT_AMBULATORY_CARE_PROVIDER_SITE_OTHER): Payer: 59 | Admitting: Family Medicine

## 2017-04-01 ENCOUNTER — Encounter (INDEPENDENT_AMBULATORY_CARE_PROVIDER_SITE_OTHER): Payer: Self-pay

## 2017-04-01 VITALS — BP 120/80 | HR 83 | Temp 97.4°F | Ht 71.25 in | Wt 198.5 lb

## 2017-04-01 DIAGNOSIS — M545 Low back pain, unspecified: Secondary | ICD-10-CM

## 2017-04-01 MED ORDER — TIZANIDINE HCL 4 MG PO TABS: 4.0000 mg | ORAL_TABLET | Freq: Every evening | ORAL | 2 refills | Status: AC

## 2017-04-01 MED ORDER — PREDNISONE 20 MG PO TABS: ORAL_TABLET | ORAL | 0 refills | Status: DC

## 2017-04-01 NOTE — Progress Notes (Signed)
Dr. Frederico Hamman T. Douglas Smolinsky, MD, [facility] [place], [address] Phone: [phone] Fax: [phone]  04/01/2017  Patient: Patrick Koors., MRN: [id number], DOB: August 21, 1960, 56 y.o.  Primary Physician:  Ria Bush, MD   Chief Complaint  Patient presents with  . Back Pain    Slipped carrying wood down a hill yesterday   Subjective:   Patrick Gentile. is a 56 y.o. very pleasant male patient who presents with the following: Back Pain  ongoing for approximately: 1 d The patient has had back pain before. The back pain is localized into the lumbar spine area. They also describe no radiculopathy.  Slipped carrying some wood. Slipped. Fell back and ? To bottom of R back.  No back history.   No numbness or tingling. No bowel or bladder incontinence. No focal weakness. Prior interventions: none Physical therapy: No Chiropractic manipulations: No Acupuncture: No Osteopathic manipulation: No Heat or cold: Minimal effect  Past Medical History, Surgical History, Family History, Medications, Allergies have been reviewed and updated if relevant.  Patient Active Problem List   Diagnosis Date Noted  . Nondisplaced fracture of distal phalanx of right thumb, initial encounter for closed fracture 10/09/2016  . Scalp lesion 08/04/2016  . Cerebellar stroke ([place]) 07/17/2016  . Health maintenance examination 06/17/2015  . Mass of right wrist 06/17/2015  . Hypertriglyceridemia 06/17/2015  . Hypothyroidism (acquired)   . Swelling of left knee joint 10/19/2014  . Calcium pyrophosphate crystal disease 10/19/2014  . Chest pain 03/12/2014  . Olecranon bursitis of right elbow 05/27/2012  . Essential hypertension 08/13/2010  . Alcohol use ([place]) 02/05/2009  . DEPRESSION, SITUATIONAL 02/05/2009  . TOBACCO ABUSE, HX OF 02/05/2009  . HSV 07/24/2008  . ANXIETY 07/24/2008  . INSOMNIA 07/24/2008  . History of throat cancer  05/30/2008    Past Medical History:  Diagnosis Date  . Abnormality of gait 07/17/2016  . Alcohol use   . Arthritis   . Cerebellar stroke ([place]) 07/17/2016   Presumed, causing gait abnormality s/p eval by neuro [name]) 07/2016 overall improving.   Marland Kitchen CPDD (calcium pyrophosphate deposition disease) 10/2013   R hand xray, L knee xray  . GERD (gastroesophageal reflux disease)   . Gout   . History of smoking   . HTN (hypertension)   . Hypertension   . Hypothyroidism (acquired)   . Insomnia   . Throat cancer ([place])    Dr. Caryl Pina [facility])  . TOBACCO ABUSE, HX OF 02/05/2009   Quit 2011      Past Surgical History:  Procedure Laterality Date  . MASS EXCISION Right 03/19/2016   EXCISION LIPOMA RIGHT WRIST;  Surgeon: Leanora Cover, MD  . NECK SURGERY  2010   oral cancer excision - Hackman (OMFS)  . SKIN LESION EXCISION  08/2010   Forehead pyogenic granuloma [name]    Social History   Social History  . Marital status: Single    Spouse name: N/A  . Number of children: 0  . Years of education: N/A   Occupational History  . Self employed Nurse, children's   Social History Main Topics  . Smoking status: Former Smoker    Packs/day: 0.50    Years: 15.00    Types: Cigarettes    Quit date: 08/03/2008  . Smokeless tobacco: Never Used  . Alcohol use 3.6 oz/week    6 Cans of beer per week     Comment: 6  pack per day  . Drug use: No  . Sexual activity: Not on file   Other Topics Concern  . Not on file   Social History Narrative   Lives alone - split from wife. 1 dog   Occ: [employer]   Activity: stays active at work, started weight lifting   Diet: good water, some fruits/vegetables    Right-handed but does a lot of things left-handed   Caffeine: 2 cups per day    Family History  Problem Relation Age of Onset  . Coronary artery disease Father        MI  . Hypertension Father   . Stroke Father   . Cancer Mother   . Cancer Brother        Brain     No Known Allergies  Medication list reviewed and updated in full in [place].  GEN: No fevers, chills. Nontoxic. Primarily MSK c/o today. MSK: Detailed in the HPI GI: tolerating PO intake without difficulty Neuro: As above  Otherwise the pertinent positives of the ROS are noted above.    Objective:   Blood pressure 120/80, pulse 83, temperature (!) 97.4 F (36.3 C), temperature source Oral, height 5' 11.25" (1.81 m), weight 198 lb 8 oz (90 kg).  Gen: Well-developed,well-nourished,in no acute distress; alert,appropriate and cooperative throughout examination HEENT: Normocephalic and atraumatic without obvious abnormalities.  Ears, externally no deformities Pulm: Breathing comfortably in no respiratory distress Range of motion at  the waist: Flexion, rotation and lateral bending: Limited in full flexion to approximately 30. Extension is normal. Lateral movements are approaching normal.  No echymosis or edema Rises to examination table with no difficulty Gait: minimally antalgic  Inspection/Deformity: No abnormality Paraspinus T:  Diffusely tender from L2-L5, predominantly on the right side.  B Ankle Dorsiflexion (L5,4): 5/5 B Great Toe Dorsiflexion (L5,4): 5/5 Heel Walk (L5): WNL Toe Walk (S1): WNL Rise/Squat (L4): WNL, mild pain  SENSORY B Medial Foot (L4): WNL B Dorsum (L5): WNL B Lateral (S1): WNL Light Touch: WNL Pinprick: WNL This is all on the right, on the left foot, the patient has a baseline decreased sensation secondary to the CVA.  REFLEXES Knee (L4): 2+ Ankle (S1): 2+  B SLR, seated: neg B SLR, supine: neg B FABER: neg B Reverse FABER: neg B Greater Troch: NT B Log Roll: neg B Stork: NT B Sciatic Notch: NT  Radiology: No results found.  Assessment and Plan:   Acute right-sided low back pain without sciatica  Anatomy reviewed. 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   Most injury such as this improved with time and if he has further difficulties greater than 10 days, then asked him to follow-up.  Follow-up: No Follow-up on file.  Meds ordered this encounter  Medications  . predniSONE (DELTASONE) 20 MG tablet    Sig: 2 tabs po for 5 days, then 1 tab po for 5 days    Dispense:  15 tablet    Refill:  0  . tiZANidine (ZANAFLEX) 4 MG tablet    Sig: Take 1 tablet (4 mg total) by mouth Nightly.    Dispense:  30 tablet    Refill:  2   Signed,  [place] T. Jamelle Goldston, MD   Allergies as of 04/01/2017   No Known Allergies     Medication List       Accurate  as of 04/01/17 11:28 AM. Always use your most recent med list.          amitriptyline 50 MG tablet Commonly known as:  ELAVIL TAKE ONE TABLET BY MOUTH AT BEDTIME   amLODipine 10 MG tablet Commonly known as:  NORVASC TAKE ONE TABLET BY MOUTH ONCE DAILY   aspirin 325 MG tablet Take 325 mg by mouth daily.   atorvastatin 40 MG tablet Commonly known as:  LIPITOR Take 1 tablet (40 mg total) by mouth daily.   DENTAGEL 1.1 % Gel dental gel Generic drug:  sodium fluoride   fenofibrate 160 MG tablet Take 1 tablet (160 mg total) by mouth daily.   fish oil-omega-3 fatty acids 1000 MG capsule Take 1 g by mouth daily.   GARLIC PO Take by mouth daily.   levothyroxine 150 MCG tablet Commonly known as:  SYNTHROID, LEVOTHROID Take 1 tablet (150 mcg total) by mouth daily.   metoprolol tartrate 100 MG tablet Commonly known as:  LOPRESSOR TAKE ONE TABLET BY MOUTH TWICE DAILY   MITIGARE 0.6 MG Caps Generic drug:  Colchicine TAKE 1 CAPSULE BY MOUTH IN THE MORNING AS NEEDED   omeprazole 20 MG capsule Commonly known as:  PRILOSEC Take 20 mg by mouth daily.   predniSONE 20 MG tablet Commonly known as:  DELTASONE 2 tabs po for 5 days, then 1 tab po for 5 days   tiZANidine 4 MG tablet Commonly known as:  ZANAFLEX Take 1  tablet (4 mg total) by mouth Nightly.            Discharge Care Instructions        Start     Ordered   04/01/17 0000  predniSONE (DELTASONE) 20 MG tablet     04/01/17 1040   04/01/17 0000  tiZANidine (ZANAFLEX) 4 MG tablet  (Dosepack) Nightly - one time     04/01/17 1040

## 2017-06-17 ENCOUNTER — Ambulatory Visit
Admit: 2017-06-17 | Discharge: 2017-06-17 | Disposition: A | Discharge status: Home / Self Care | Payer: Commercial Managed Care - PPO

## 2017-06-17 DIAGNOSIS — C029 Malignant neoplasm of tongue, unspecified: Principal | ICD-10-CM

## 2017-06-18 DIAGNOSIS — M1712 Unilateral primary osteoarthritis, left knee: Secondary | ICD-10-CM | POA: Diagnosis not present

## 2017-06-20 ENCOUNTER — Telehealth: Payer: Self-pay | Admitting: Family Medicine

## 2017-06-20 NOTE — Telephone Encounter (Addendum)
Received preop clearance request from Ralie[facility] Ip on patient. H/o cerebellar CVA - request has been sent to his neurologist Dr [name] for neurological clearance. RCRI = 1 (h/o CVA) placing him at 0.9% risk of major cardiac event.  Caution in h/o habitual alcohol use (3-4 beers/day) Will defer to neurology regarding aspirin 325mg  dosing No need for further intervention at this time - medical and cardiac clearance filled out and placed in my out box.  Lab Results  Component Value Date   TSH 0.49 09/04/2016

## 2017-06-23 ENCOUNTER — Ambulatory Visit: Admission: RE | Admit: 2017-06-23 | Discharge: 2017-06-23 | Disposition: A | Discharge status: Home / Self Care

## 2017-06-23 DIAGNOSIS — C01 Malignant neoplasm of base of tongue: Principal | ICD-10-CM

## 2017-07-12 ENCOUNTER — Telehealth: Payer: Self-pay | Admitting: *Deleted

## 2017-07-12 NOTE — Telephone Encounter (Signed)
Tried home number [phone]), call could not be completed per automated message. Tried mobile [phone]), went to VM, VM full, unable to LVM. Will try again later.  Cx appt so they will not be charged NS fee. Office closed tomorrow d/t inclement weather.

## 2017-07-13 ENCOUNTER — Ambulatory Visit: Payer: Self-pay | Admitting: Neurology

## 2017-07-14 ENCOUNTER — Ambulatory Visit: Payer: 59 | Admitting: Neurology

## 2017-07-14 ENCOUNTER — Encounter: Payer: Self-pay | Admitting: Family Medicine

## 2017-07-14 ENCOUNTER — Encounter: Payer: Self-pay | Admitting: Neurology

## 2017-07-14 ENCOUNTER — Ambulatory Visit: Payer: 59 | Admitting: Family Medicine

## 2017-07-14 VITALS — BP 143/96 | HR 78 | Wt 203.5 lb

## 2017-07-14 VITALS — BP 126/82 | HR 96 | Temp 98.6°F | Wt 203.0 lb

## 2017-07-14 DIAGNOSIS — I639 Cerebral infarction, unspecified: Secondary | ICD-10-CM | POA: Diagnosis not present

## 2017-07-14 DIAGNOSIS — E781 Pure hyperglyceridemia: Secondary | ICD-10-CM

## 2017-07-14 DIAGNOSIS — Z7289 Other problems related to lifestyle: Secondary | ICD-10-CM

## 2017-07-14 DIAGNOSIS — Z01818 Encounter for other preprocedural examination: Secondary | ICD-10-CM | POA: Diagnosis not present

## 2017-07-14 DIAGNOSIS — M1712 Unilateral primary osteoarthritis, left knee: Secondary | ICD-10-CM | POA: Diagnosis not present

## 2017-07-14 DIAGNOSIS — Z85819 Personal history of malignant neoplasm of unspecified site of lip, oral cavity, and pharynx: Secondary | ICD-10-CM | POA: Diagnosis not present

## 2017-07-14 DIAGNOSIS — Z789 Other specified health status: Secondary | ICD-10-CM

## 2017-07-14 DIAGNOSIS — E039 Hypothyroidism, unspecified: Secondary | ICD-10-CM

## 2017-07-14 DIAGNOSIS — I10 Essential (primary) hypertension: Secondary | ICD-10-CM | POA: Diagnosis not present

## 2017-07-14 NOTE — Progress Notes (Signed)
BP 126/82 (BP Location: Left Arm, Patient Position: Sitting, Cuff Size: Normal)   Pulse 96   Temp 98.6 F (37 C) (Oral)   Wt 203 lb (92.1 kg)   SpO2 98%   BMI 28.11 kg/m    CC: preop evaluation Subjective:    Patient ID: Patrick Ochoa., male    DOB: 12/03/1960, 56 y.o.   MRN: [id number]  HPI: Patrick Mcquitty. is a 56 y.o. male presenting on 07/14/2017 for Pre-op Exam (for total left knee replacement end of 08/2017)   Planned knee replacement end of 08/2017 by Patrick Bailey. Current severe knee osteoarthritis, activity limiting. H/o cerebellar CVA 07/2016. Saw Patrick Bailey today for preop eval, note reviewed.  See phone note from 06/20/2017 - I provided clearance at that time.   Has cut down on alcohol to 3-4 beers/day.  Has tolerated GETA in the past, latest surgery was R wrist lipoma excision 2017, prior had neck surgery for oral squamous cell cancer excision 2010.   Daily PPI controls GERD - breakthrough sxs if missed dose.   Denies chest pain, dyspnea, palpitations, dizziness, headaches.   Relevant past medical, surgical, family and social history reviewed and updated as indicated. Interim medical history since our last visit reviewed. Allergies and medications reviewed and updated. Outpatient Medications Prior to Visit  Medication Sig Dispense Refill  . amitriptyline (ELAVIL) 50 MG tablet TAKE ONE TABLET BY MOUTH AT BEDTIME 90 tablet 2  . amLODipine (NORVASC) 10 MG tablet TAKE ONE TABLET BY MOUTH ONCE DAILY 90 tablet 3  . aspirin 325 MG tablet Take 325 mg by mouth daily.    Marland Kitchen atorvastatin (LIPITOR) 40 MG tablet Take 1 tablet (40 mg total) by mouth daily. 90 tablet 3  . DENTAGEL 1.1 % GEL dental gel     . fenofibrate 160 MG tablet Take 1 tablet (160 mg total) by mouth daily. 90 tablet 3  . fish oil-omega-3 fatty acids 1000 MG capsule Take 1 g by mouth daily.      Marland Kitchen levothyroxine (SYNTHROID, LEVOTHROID) 150 MCG tablet Take 1 tablet (150 mcg total) by mouth daily. 90 tablet  3  . metoprolol (LOPRESSOR) 100 MG tablet TAKE ONE TABLET BY MOUTH TWICE DAILY 180 tablet 3  . MITIGARE 0.6 MG CAPS TAKE 1 CAPSULE BY MOUTH IN THE MORNING AS NEEDED 90 capsule 2  . omeprazole (PRILOSEC) 20 MG capsule Take 20 mg by mouth daily.    Marland Kitchen GARLIC PO Take by mouth daily.    . predniSONE (DELTASONE) 20 MG tablet 2 tabs po for 5 days, then 1 tab po for 5 days 15 tablet 0   No facility-administered medications prior to visit.     Past Medical History:  Diagnosis Date  . Abnormality of gait 07/17/2016  . Alcohol use   . Arthritis   . Cerebellar stroke ([place]) 07/17/2016   Presumed, causing gait abnormality s/p eval by neuro Patrick Bailey) 07/2016 overall improving.   Marland Kitchen CPDD (calcium pyrophosphate deposition disease) 10/2013   R hand xray, L knee xray  . GERD (gastroesophageal reflux disease)   . Gout   . History of smoking   . HTN (hypertension)   . Hypertension   . Hypothyroidism (acquired)   . Insomnia   . Throat cancer ([place])    Patrick Bailey [facility])  . TOBACCO ABUSE, HX OF 02/05/2009   Quit 2011      Past Surgical History:  Procedure Laterality Date  . MASS EXCISION Right 03/19/2016  EXCISION LIPOMA RIGHT WRIST;  Surgeon: Patrick Cover, MD  . NECK SURGERY  2010   oral cancer excision - Patrick Bailey (OMFS)  . SKIN LESION EXCISION  08/2010   Forehead pyogenic granuloma Patrick Bailey)    Family History  Problem Relation Age of Onset  . Coronary artery disease Father        MI  . Hypertension Father   . Stroke Father   . Cancer Mother   . Cancer Brother        Brain    Social History   Tobacco Use  . Smoking status: Former Smoker    Packs/day: 0.50    Years: 15.00    Pack years: 7.50    Types: Cigarettes    Last attempt to quit: 08/03/2008    Years since quitting: 8.9  . Smokeless tobacco: Never Used  Substance Use Topics  . Alcohol use: Yes    Alcohol/week: 1.8 oz    Types: 3 Cans of beer per week    Comment: 3-4 beers/day  . Drug use: No    Per HPI unless specifically  indicated in ROS section below Review of Systems     Objective:    BP 126/82 (BP Location: Left Arm, Patient Position: Sitting, Cuff Size: Normal)   Pulse 96   Temp 98.6 F (37 C) (Oral)   Wt 203 lb (92.1 kg)   SpO2 98%   BMI 28.11 kg/m   Wt Readings from Last 3 Encounters:  07/14/17 203 lb (92.1 kg)  07/14/17 203 lb 8 oz (92.3 kg)  04/01/17 198 lb 8 oz (90 kg)    Physical Exam  Constitutional: He appears well-developed and well-nourished. No distress.  HENT:  Mouth/Throat: Oropharynx is clear and moist. No oropharyngeal exudate.  Cardiovascular: Normal rate, regular rhythm, normal heart sounds and intact distal pulses.  No murmur heard. Pulmonary/Chest: Effort normal and breath sounds normal. No respiratory distress. He has no wheezes. He has no rales.  Musculoskeletal: He exhibits no edema.  Skin: Skin is warm and dry. No rash noted.  Psychiatric: He has a normal mood and affect.  Nursing note and vitals reviewed.  Results for orders placed or performed in visit on 12/11/16  Lipid panel  Result Value Ref Range   Cholesterol 135 0 - 200 mg/dL   Triglycerides (H) 0.0 - 149.0 mg/dL    576.0 Triglyceride is over 400; calculations on Lipids are invalid.   HDL 31.00 (L) >39.00 mg/dL   Total CHOL/HDL Ratio 4   Comprehensive metabolic panel  Result Value Ref Range   Sodium 137 135 - 145 mEq/L   Potassium 4.6 3.5 - 5.1 mEq/L   Chloride 102 96 - 112 mEq/L   CO2 30 19 - 32 mEq/L   Glucose, Bld 98 70 - 99 mg/dL   BUN 13 6 - 23 mg/dL   Creatinine, Ser 1.07 0.40 - 1.50 mg/dL   Total Bilirubin 0.4 0.2 - 1.2 mg/dL   Alkaline Phosphatase 69 39 - 117 U/L   AST 34 0 - 37 U/L   ALT 52 0 - 53 U/L   Total Protein 7.1 6.0 - 8.3 g/dL   Albumin 4.4 3.5 - 5.2 g/dL   Calcium 9.5 8.4 - 10.5 mg/dL   GFR 75.80 >60.00 mL/min  LDL cholesterol, direct  Result Value Ref Range   Direct LDL 37.0 mg/dL      Assessment & Plan:  Declines flu shot Problem List Items Addressed This Visit     Alcohol use (  [place])    He has cut down to 3-4 beers/day. Habitual alcohol use.       Cerebellar stroke ([facility])    Appreciate neuro clearance.       Essential hypertension    Chronic, stable. Continue current regimen.       History of throat cancer   Hypertriglyceridemia    Update FLP on fenofibrate, lipitor, fish oil.       Relevant Orders   Lipid panel   Hypothyroidism (acquired)    Update TSH.       Relevant Orders   TSH   Preoperative clearance - Primary    Upcoming total L knee replacement by Patrick Bailey.  RCRI = 1 (h/o CVA) placing him at 0.9% risk of major cardiac event.  H/o cerebellar CVA 07/2016 - saw neurologist Patrick Bailey for neurological clearance today. Will defer to neurology/ortho regarding aspirin 325mg  dosing perioperatively.  Caution in h/o habitual alcohol use (3-4 beers/day) Check labwork today in preparation for surgery. No need for further intervention at this time - medical and cardiac clearance provided.       Relevant Orders   Comprehensive metabolic panel   CBC with Differential/Platelet   Protime-INR   Primary localized osteoarthritis of left knee    Upcoming total knee replacement.           Follow up plan: Return if symptoms worsen or fail to improve.  Ria Bush, MD

## 2017-07-14 NOTE — Assessment & Plan Note (Signed)
Update FLP on fenofibrate, lipitor, fish oil.

## 2017-07-14 NOTE — Assessment & Plan Note (Signed)
Appreciate neuro clearance.

## 2017-07-14 NOTE — Assessment & Plan Note (Signed)
Update TSH

## 2017-07-14 NOTE — Patient Instructions (Addendum)
Labs today I think you should do fine with surgery - form will be faxed back to [facility] ortho office.  Good luck with surgery! Good to see you today, call us with questions.

## 2017-07-14 NOTE — Assessment & Plan Note (Signed)
Upcoming total knee replacement.

## 2017-07-14 NOTE — Assessment & Plan Note (Signed)
Chronic, stable. Continue current regimen. 

## 2017-07-14 NOTE — Assessment & Plan Note (Addendum)
Upcoming total L knee replacement by Dr Noemi Chapel.  RCRI = 1 (h/o CVA) placing him at 0.9% risk of major cardiac event.  H/o cerebellar CVA 07/2016 - saw neurologist Dr [name] for neurological clearance today. Will defer to neurology/ortho regarding aspirin 325mg  dosing perioperatively.  Caution in h/o habitual alcohol use (3-4 beers/day) Check labwork today in preparation for surgery. No need for further intervention at this time - medical and cardiac clearance provided.

## 2017-07-14 NOTE — Progress Notes (Signed)
Reason for visit: Stroke follow-up  Patrick Soules. is an 56 y.o. male  History of present illness:  Patrick Bailey is a 56 year old right-handed white male who was seen about a year ago with onset of headache and gait instability.  The patient was noted to have occlusion of the right vertebral artery.  MRI of the brain did not show an acute stroke but he was presumed to have a small medullary infarct that was not apparent on MRI.  The patient underwent physical therapy and he improved over time.  The patient has not had any residual deficits with balance or headache.  The patient is contemplating a total knee replacement on the left knee in January 2019.  The patient comes in for neurologic and medical clearance for the surgery.  The patient has been back to work, he is working full-time without difficulty.  He denies any double vision, loss of vision, swallowing problems, or new numbness or weakness of the face, arms, or legs.  He remains on blood pressure medications, cholesterol medications, and aspirin therapy.  The patient has modified his diet, he is no longer eating fried foods.  He quit smoking several years ago.  Past Medical History:  Diagnosis Date  . Abnormality of gait 07/17/2016  . Alcohol use   . Arthritis   . Cerebellar stroke ([place]) 07/17/2016   Presumed, causing gait abnormality s/p eval by neuro [name]) 07/2016 overall improving.   Marland Kitchen CPDD (calcium pyrophosphate deposition disease) 10/2013   R hand xray, L knee xray  . GERD (gastroesophageal reflux disease)   . Gout   . History of smoking   . HTN (hypertension)   . Hypertension   . Hypothyroidism (acquired)   . Insomnia   . Throat cancer ([place])    Dr. Caryl Pina [facility])  . TOBACCO ABUSE, HX OF 02/05/2009   Quit 2011      Past Surgical History:  Procedure Laterality Date  . MASS EXCISION Right 03/19/2016   EXCISION LIPOMA RIGHT WRIST;  Surgeon: Leanora Cover, MD  . NECK SURGERY  2010   oral cancer excision - [facility]  ([facility])  . SKIN LESION EXCISION  08/2010   Forehead pyogenic granuloma [name]    Family History  Problem Relation Age of Onset  . Coronary artery disease Father        MI  . Hypertension Father   . Stroke Father   . Cancer Mother   . Cancer Brother        Brain    Social history:  reports that he quit smoking about 8 years ago. His smoking use included cigarettes. He has a 7.50 pack-year smoking history. he has never used smokeless tobacco. He reports that he drinks about 3.6 oz of alcohol per week. He reports that he does not use drugs.   No Known Allergies  Medications:  Prior to Admission medications   Medication Sig Start Date End Date Taking? Authorizing Provider  amitriptyline (ELAVIL) 50 MG tablet TAKE ONE TABLET BY MOUTH AT BEDTIME 01/15/17  Yes Ria Bush, MD  amLODipine (NORVASC) 10 MG tablet TAKE ONE TABLET BY MOUTH ONCE DAILY 09/11/16  Yes Ria Bush, MD  aspirin 325 MG tablet Take 325 mg by mouth daily.   Yes [provider]  atorvastatin (LIPITOR) 40 MG tablet Take 1 tablet (40 mg total) by mouth daily. 12/12/16  Yes Ria Bush, MD  DENTAGEL 1.1 % GEL dental gel  09/22/16  Yes [provider]  fenofibrate  160 MG tablet Take 1 tablet (160 mg total) by mouth daily. 12/12/16  Yes Ria Bush, MD  fish oil-omega-3 fatty acids 1000 MG capsule Take 1 g by mouth daily.     Yes [provider]  GARLIC PO Take by mouth daily.   Yes [provider]  levothyroxine (SYNTHROID, LEVOTHROID) 150 MCG tablet Take 1 tablet (150 mcg total) by mouth daily. 09/11/16  Yes Ria Bush, MD  metoprolol (LOPRESSOR) 100 MG tablet TAKE ONE TABLET BY MOUTH TWICE DAILY 09/11/16  Yes Ria Bush, MD  MITIGARE 0.6 MG CAPS TAKE 1 CAPSULE BY MOUTH IN THE MORNING AS NEEDED 01/15/17  Yes Ria Bush, MD  omeprazole (PRILOSEC) 20 MG capsule Take 20 mg by mouth daily.   Yes [provider]  predniSONE (DELTASONE) 20 MG  tablet 2 tabs po for 5 days, then 1 tab po for 5 days 04/01/17  Yes Copland, Spencer, MD    ROS:  Out of a complete 14 system review of symptoms, the patient complains only of the following symptoms, and all other reviewed systems are negative.  Knee pain  Blood pressure (!) 143/96, pulse 78, weight 203 lb 8 oz (92.3 kg).  Physical Exam  General: The patient is alert and cooperative at the time of the examination.  Respiratory: Lung fields are clear.  Cardiovascular: Regular rate and rhythm, no murmurs or rubs are noted.  Neck: Neck is supple, no carotid bruits are noted.  Skin: No significant peripheral edema is noted.   Neurologic Exam  Mental status: The patient is alert and oriented x 3 at the time of the examination. The patient has apparent normal recent and remote memory, with an apparently normal attention span and concentration ability.   Cranial nerves: Facial symmetry is present. Speech is normal, no aphasia or dysarthria is noted. Extraocular movements are full. Visual fields are full.  Motor: The patient has good strength in all 4 extremities.  Sensory examination: Soft touch sensation is symmetric on the face, arms, and legs.  Coordination: The patient has good finger-nose-finger and heel-to-shin bilaterally.  Gait and station: The patient has a normal gait. Tandem gait is normal. Romberg is negative. No drift is seen.  Reflexes: Deep tendon reflexes are symmetric.   Assessment/Plan:  1.  Cerebrovascular disease, prior small medullary stroke  The patient has done well following the stroke event, he has no residual neurologic deficits.  The patient is being managed for his blood pressure, he is altered his diet, he does not smoke cigarettes.  He remains on aspirin therapy.  There are no neurologic contraindications to having a total knee replacement in January 2019.  The patient will follow-up through this office on an as-needed basis.   Jill Alexanders  MD 07/14/2017 10:09 AM  [facility] [address] [place], [place] [address]  Phone [phone] Fax [phone]

## 2017-07-14 NOTE — Assessment & Plan Note (Signed)
He has cut down to 3-4 beers/day. Habitual alcohol use.

## 2017-07-15 LAB — CBC WITH DIFFERENTIAL/PLATELET
BASOS PCT: 0.3 % (ref 0.0–3.0)
Basophils Absolute: 0 10*3/uL (ref 0.0–0.1)
EOS PCT: 5.8 % — AB (ref 0.0–5.0)
Eosinophils Absolute: 0.4 10*3/uL (ref 0.0–0.7)
HCT: 43.5 % (ref 39.0–52.0)
HEMOGLOBIN: 14.8 g/dL (ref 13.0–17.0)
LYMPHS ABS: 2.2 10*3/uL (ref 0.7–4.0)
Lymphocytes Relative: 33.9 % (ref 12.0–46.0)
MCHC: 34.1 g/dL (ref 30.0–36.0)
MCV: 95.4 fl (ref 78.0–100.0)
MONO ABS: 0.6 10*3/uL (ref 0.1–1.0)
Monocytes Relative: 9.6 % (ref 3.0–12.0)
NEUTROS ABS: 3.2 10*3/uL (ref 1.4–7.7)
NEUTROS PCT: 50.4 % (ref 43.0–77.0)
PLATELETS: 278 10*3/uL (ref 150.0–400.0)
RBC: 4.55 Mil/uL (ref 4.22–5.81)
RDW: 12.5 % (ref 11.5–15.5)
WBC: 6.4 10*3/uL (ref 4.0–10.5)

## 2017-07-15 LAB — COMPREHENSIVE METABOLIC PANEL
ALT: 35 U/L (ref 0–53)
AST: 24 U/L (ref 0–37)
Albumin: 4.7 g/dL (ref 3.5–5.2)
Alkaline Phosphatase: 60 U/L (ref 39–117)
BUN: 12 mg/dL (ref 6–23)
CHLORIDE: 101 meq/L (ref 96–112)
CO2: 30 meq/L (ref 19–32)
Calcium: 9.6 mg/dL (ref 8.4–10.5)
Creatinine, Ser: 1.07 mg/dL (ref 0.40–1.50)
GFR: 75.64 mL/min (ref >60.00)
GLUCOSE: 84 mg/dL (ref 70–99)
POTASSIUM: 4.3 meq/L (ref 3.5–5.1)
SODIUM: 139 meq/L (ref 135–145)
Total Bilirubin: 0.4 mg/dL (ref 0.2–1.2)
Total Protein: 7.6 g/dL (ref 6.0–8.3)

## 2017-07-15 LAB — LIPID PANEL
Cholesterol: 151 mg/dL (ref 0–200)
HDL: 39.8 mg/dL (ref >39.00)
NONHDL: 111.36
Total CHOL/HDL Ratio: 4
Triglycerides: 331 mg/dL — ABNORMAL HIGH (ref 0.0–149.0)
VLDL: 66.2 mg/dL — AB (ref 0.0–40.0)

## 2017-07-15 LAB — LDL CHOLESTEROL, DIRECT: LDL DIRECT: 71 mg/dL

## 2017-07-15 LAB — TSH: TSH: 4.77 u[IU]/mL — AB (ref 0.35–4.50)

## 2017-07-15 LAB — PROTIME-INR
INR: 1 ratio (ref 0.8–1.0)
Prothrombin Time: 10.3 s (ref 9.6–13.1)

## 2017-08-09 DIAGNOSIS — M1712 Unilateral primary osteoarthritis, left knee: Secondary | ICD-10-CM | POA: Diagnosis not present

## 2017-08-09 MED ORDER — DENTAGEL 1.1 %
7 refills | 0 days | Status: CP
Start: 2017-08-09 — End: 2018-06-21

## 2017-08-09 NOTE — H&P (Signed)
PREOPERATIVE H&P Patient ID: Patrick Bailey. MRN: [id number] DOB/AGE: 07-Mar-1961 57 y.o.  Chief Complaint: OA LEFT KNEE  Planned Procedure Date: 1/22/199 Medical and Cardiac Clearance by Dr. Danise Mina   Additional clearance by Dr. Margette Fast, Neurology  HPI: Patrick Law. is a 57 y.o. male with a history of right sided neck and tongue cancer, reportedly in remission, presumed cerebellar TIA w/o residual sxs 2017 on ASA 81 mg, HTN, CPPD, Remote L Tibial Plateau Fx w/ cannulated screw fixation, and GERD who presents for evaluation of OA LEFT KNEE. The patient has a history of pain and functional disability in the left knee due to arthritis and has failed non-surgical conservative treatments for greater than 12 weeks to include NSAID's and/or analgesics, corticosteriod injections and activity modification.  Onset of symptoms was gradual, starting 2 years ago with gradually worsening course since that time.  Patient currently rates pain at 7 out of 10 with activity. Patient has night pain, worsening of pain with activity and weight bearing and pain that interferes with activities of daily living.  Patient has evidence of subchondral cysts, periarticular osteophytes and joint space narrowing by imaging studies. There is no active infection.  Past Medical History:  Diagnosis Date  . Abnormality of gait 07/17/2016  . Alcohol use   . Arthritis   . Cerebellar stroke ([place]) 07/17/2016   Presumed, causing gait abnormality s/p eval by neuro [name]) 07/2016 overall improving.   Marland Kitchen CPDD (calcium pyrophosphate deposition disease) 10/2013   R hand xray, L knee xray  . GERD (gastroesophageal reflux disease)   . Gout   . History of smoking   . HTN (hypertension)   . Hypertension   . Hypothyroidism (acquired)   . Insomnia   . Throat cancer ([place])    Dr. Caryl Pina [facility])  . TOBACCO ABUSE, HX OF 02/05/2009   Quit 2011     Past Surgical History:  Procedure Laterality Date  . MASS EXCISION Right  03/19/2016   EXCISION LIPOMA RIGHT WRIST;  Surgeon: Leanora Cover, MD  . NECK SURGERY  2010   oral cancer excision - [facility] ([facility])  . SKIN LESION EXCISION  08/2010   Forehead pyogenic granuloma [name]   No Known Allergies   Prior to Admission medications   Medication Sig Start Date End Date Taking? Authorizing Provider  amitriptyline (ELAVIL) 50 MG tablet TAKE ONE TABLET BY MOUTH AT BEDTIME 01/15/17  Yes Ria Bush, MD  amLODipine (NORVASC) 10 MG tablet TAKE ONE TABLET BY MOUTH ONCE DAILY Patient taking differently: Take 10 mg by mouth daily.  09/11/16  Yes Ria Bush, MD  aspirin EC 81 MG tablet Take 81 mg by mouth daily.   Yes [provider]  atorvastatin (LIPITOR) 40 MG tablet Take 1 tablet (40 mg total) by mouth daily. 12/12/16  Yes Ria Bush, MD  DENTAGEL 1.1 % GEL dental gel Place 1 application onto teeth at bedtime.  09/22/16  Yes [provider]  fenofibrate 160 MG tablet Take 1 tablet (160 mg total) by mouth daily. 12/12/16  Yes Ria Bush, MD  fish oil-omega-3 fatty acids 1000 MG capsule Take 1 g by mouth daily.     Yes [provider]  Ibuprofen-Diphenhydramine Cit (ADVIL PM PO) Take 1 tablet by mouth at bedtime as needed (SLEEP).   Yes [provider]  levothyroxine (SYNTHROID, LEVOTHROID) 150 MCG tablet Take 1 tablet (150 mcg total) by mouth daily. 09/11/16  Yes Ria Bush, MD  metoprolol (LOPRESSOR) 100 MG tablet  TAKE ONE TABLET BY MOUTH TWICE DAILY Patient taking differently: Take 100 mg by mouth 2 (two) times daily.  09/11/16  Yes Ria Bush, MD  MITIGARE 0.6 MG CAPS TAKE 1 CAPSULE BY MOUTH IN THE MORNING AS NEEDED Patient taking differently: TAKE 1 CAPSULE BY MOUTH IN THE MORNING EVERY OTHER DAY 01/15/17  Yes Ria Bush, MD  omeprazole (PRILOSEC) 20 MG capsule Take 20 mg by mouth daily.   Yes [provider]   Social History: Divorced, former smoker - 1 ppd x 5 yr.  Quit 11 yrs ago.   Drinks 6 beers per day, but less lately.  He has gone significant periods at a time without drinking and without withdraw sxs.  He lives alone in a single floor residence on the same land as his father.    Family History  Problem Relation Age of Onset  . Coronary artery disease Father        MI  . Hypertension Father   . Stroke Father   . Cancer Mother   . Cancer Brother        Brain    ROS: Currently denies lightheadedness, dizziness, Fever, chills, CP, SOB.   No personal history of DVT, PE, or MI No loose teeth or dentures All other systems have been reviewed and were otherwise currently negative with the exception of those mentioned in the HPI and as above.  Objective: Vitals: Ht: 6 feet 0 in. wt: 199 temp: 97.4 BP: 133/93 pulse: 81 O2 98 % on room air. Physical Exam: General: Alert, NAD.  Antalgic Gait  HEENT: EOMI, Good Neck Extension  Pulm: No increased work of breathing.  Clear B/L A/P w/o crackle or wheeze.  CV: RRR, No m/g/r appreciated  GI: soft, NT, ND Neuro: Neuro without gross focal deficit.  Sensation intact distally Skin: No lesions in the area of chief complaint MSK/Surgical Site: Left knee w/o redness or effusion.  + JLT. ROM 2-120.  5/5 strength in extension and flexion.  +EHL/FHL.  NVI.  Stable varus and valgus stress.    Imaging Review Plain radiographs demonstrate severe degenerative joint disease of the left knee.   Assessment: OA LEFT KNEE Principal Problem:   Primary localized osteoarthritis of left knee Active Problems:   History of throat cancer   Essential hypertension   Calcium pyrophosphate crystal disease   Hypertriglyceridemia   Cerebellar stroke ([place])   Plan: Plan for Procedure(s): LEFT TOTAL KNEE ARTHROPLASTY LEFT KNEE HARDWARE REMOVAL  The patient history, physical exam, clinical judgement of the provider and imaging are consistent with end stage degenerative joint disease and total joint arthroplasty is deemed medically necessary.  The treatment options including medical management, injection therapy, and arthroplasty were discussed at length. The risks and benefits of Procedure(s): LEFT TOTAL KNEE ARTHROPLASTY LEFT KNEE HARDWARE REMOVAL were presented and reviewed.  The risks of nonoperative treatment, versus surgical intervention including but not limited to continued pain, aseptic loosening, stiffness, dislocation/subluxation, infection, bleeding, nerve injury, blood clots, cardiopulmonary complications, morbidity, mortality, among others were discussed. The patient verbalizes understanding and wishes to proceed with the plan.  Patient is being admitted for inpatient treatment for surgery, pain control, PT, OT, prophylactic antibiotics, VTE prophylaxis, progressive ambulation, ADL's and discharge planning.   Dental prophylaxis discussed and recommended for 2 years postoperatively.   The patient does meet the criteria for TXA which will be used perioperatively via IV.    ASA 325 mg will be used postoperatively for DVT prophylaxis in addition to SCDs, and  early ambulation.  Continue omeprazole for gastroprotection postop  The patient is planning to be discharged home with home health services ([facility]) in care of his Father.  Severity of Illness: The appropriate patient status for this patient is OBSERVATION. Observation status is judged to be reasonable and necessary in order to provide the required intensity of service to ensure the patient's safety. The patient's presenting symptoms, physical exam findings, and initial radiographic and laboratory data in the context of their medical condition is felt to place them at decreased risk for further clinical deterioration. Furthermore, it is anticipated that the patient will be medically stable for discharge from the hospital within 2 midnights of admission. The following factors support the patient status of observation.    Patrick Bailey III, PA-C 08/09/2017 5:08  PM

## 2017-08-12 ENCOUNTER — Inpatient Hospital Stay (HOSPITAL_COMMUNITY): Admission: RE | Admit: 2017-08-12 | Payer: 59 | Source: Ambulatory Visit

## 2017-08-13 ENCOUNTER — Ambulatory Visit: Payer: 59 | Admitting: Family Medicine

## 2017-08-13 ENCOUNTER — Encounter: Payer: Self-pay | Admitting: Family Medicine

## 2017-08-13 VITALS — BP 124/78 | HR 76 | Temp 98.2°F | Wt 201.5 lb

## 2017-08-13 DIAGNOSIS — J22 Unspecified acute lower respiratory infection: Secondary | ICD-10-CM | POA: Diagnosis not present

## 2017-08-13 MED ORDER — HYDROCODONE-HOMATROPINE 5-1.5 MG/5ML PO SYRP: 5.0000 mL | ORAL_SOLUTION | Freq: Three times a day (TID) | ORAL | 0 refills | Status: DC | PRN

## 2017-08-13 MED ORDER — BENZONATATE 100 MG PO CAPS: 100.0000 mg | ORAL_CAPSULE | Freq: Three times a day (TID) | ORAL | 0 refills | Status: DC | PRN

## 2017-08-13 MED ORDER — DOXYCYCLINE HYCLATE 100 MG PO CAPS: 100.0000 mg | ORAL_CAPSULE | Freq: Two times a day (BID) | ORAL | 0 refills | Status: DC

## 2017-08-13 NOTE — Patient Instructions (Signed)
Can take ceterizine (generic Zyrtec) for nasal drainage  If you use night time cough medicine, do not take any PM medication (tylenol PM)

## 2017-08-13 NOTE — Progress Notes (Signed)
Subjective:    Patient ID: [name]., male    DOB: 1961/02/16, 57 y.o.   MRN: [id number]  HPI This is a 57 yo male who presents today with cough x 3 weeks. Started as cold, has nasal drainage clear, intermittent headache, no ear pain. No SOB, some wheezing with cough.  Feels fatigued, subjective fever. Has been taking Mucinex DM, Dayquil, Nyquil. Cough worse at night.   Past Medical History:  Diagnosis Date  . Abnormality of gait 07/17/2016  . Alcohol use   . Arthritis   . Cerebellar stroke ([place]) 07/17/2016   Presumed, causing gait abnormality s/p eval by neuro [name]) 07/2016 overall improving.   Marland Kitchen CPDD (calcium pyrophosphate deposition disease) 10/2013   R hand xray, L knee xray  . GERD (gastroesophageal reflux disease)   . Gout   . History of smoking   . HTN (hypertension)   . Hypertension   . Hypothyroidism (acquired)   . Insomnia   . Throat cancer ([place])    Dr. Caryl Pina [facility])  . TOBACCO ABUSE, HX OF 02/05/2009   Quit 2011     Past Surgical History:  Procedure Laterality Date  . MASS EXCISION Right 03/19/2016   EXCISION LIPOMA RIGHT WRIST;  Surgeon: Leanora Cover, MD  . NECK SURGERY  2010   oral cancer excision - [facility] ([facility])  . SKIN LESION EXCISION  08/2010   Forehead pyogenic granuloma [name]   Family History  Problem Relation Age of Onset  . Coronary artery disease Father        MI  . Hypertension Father   . Stroke Father   . Cancer Mother   . Cancer Brother        Brain   Social History   Tobacco Use  . Smoking status: Former Smoker    Packs/day: 0.50    Years: 15.00    Pack years: 7.50    Types: Cigarettes    Last attempt to quit: 08/03/2008    Years since quitting: 9.0  . Smokeless tobacco: Never Used  Substance Use Topics  . Alcohol use: Yes    Alcohol/week: 1.8 oz    Types: 3 Cans of beer per week    Comment: 3-4 beers/day  . Drug use: No      Review of Systems Per HPI    Objective:   Physical Exam Physical Exam    Constitutional: Oriented to person, place, and time. He appears well-developed and well-nourished.  HENT:  Head: Normocephalic and atraumatic.  Eyes: Conjunctivae are normal.  Neck: Normal range of motion. Neck supple.  Cardiovascular: Normal rate, regular rhythm and normal heart sounds.   Pulmonary/Chest: Effort normal and breath sounds normal.  Musculoskeletal: Normal range of motion.  Neurological: Alert and oriented to person, place, and time.  Skin: Skin is warm and dry.  Psychiatric: Normal mood and affect. Behavior is normal. Judgment and thought content normal.  Vitals reviewed.     BP 124/78   Pulse 76   Temp 98.2 F (36.8 C) (Oral)   Wt 201 lb 8 oz (91.4 kg)   SpO2 98%   BMI 27.91 kg/m  Wt Readings from Last 3 Encounters:  08/13/17 201 lb 8 oz (91.4 kg)  07/14/17 203 lb (92.1 kg)  07/14/17 203 lb 8 oz (92.3 kg)       Assessment & Plan:  1. Lower respiratory infection - given duration, will cover for bacterial infection - HYDROcodone-homatropine (HYCODAN) 5-1.5 MG/5ML syrup; Take 5 mLs by mouth  every 8 (eight) hours as needed for cough.  Dispense: 120 mL; Refill: 0 - doxycycline (VIBRAMYCIN) 100 MG capsule; Take 1 capsule (100 mg total) by mouth 2 (two) times daily.  Dispense: 14 capsule; Refill: 0 - benzonatate (TESSALON) 100 MG capsule; Take 1-2 capsules (100-200 mg total) by mouth 3 (three) times daily as needed for cough.  Dispense: 40 capsule; Refill: 0 -  Patient Instructions  Can take ceterizine (generic Zyrtec) for nasal drainage  If you use night time cough medicine, do not take any PM medication (tylenol PM)    Clarene Reamer, FNP-BC  [facility], [place]  08/13/2017 9:28 AM

## 2017-08-13 NOTE — Pre-Procedure Instructions (Signed)
[name].  08/13/2017      [place], [place] - [address] [place] [address] Phone: [phone] Fax: [phone]    Your procedure is scheduled on Tuesday January 22.  Report to [facility] Admitting at 12:30 P.M.  Call this number if you have problems the morning of surgery:  [phone]   Remember:  Do not eat food or drink liquids after midnight.  Take these medicines the morning of surgery with A SIP OF WATER :   Metoprolol (lopressor) Amlodipine (norvasc) Omeprazole (prilosec) Doxycycline (vibramycin) Levothyroxine (synthroid) Hydrocodone-acetaminophen (norco) if needed  7 days prior to surgery STOP taking any  Aleve, Naproxen, Ibuprofen, Motrin, Advil, Goody's, BC's, all herbal medications, fish oil, and all vitamins  **Follow surgeon's instructions on stopping Aspirin. If no instructions were given, please call your surgeon's office**     Do not wear jewelry, make-up or nail polish.  Do not wear lotions, powders, or perfumes, or deodorant.  Do not shave 48 hours prior to surgery.  Men may shave face and neck.  Do not bring valuables to the hospital.  [facility] is not responsible for any belongings or valuables.  Contacts, dentures or bridgework may not be worn into surgery.  Leave your suitcase in the car.  After surgery it may be brought to your room.  For patients admitted to the hospital, discharge time will be determined by your treatment team.  Patients discharged the day of surgery will not be allowed to drive home.   Special instructions:    [facility]- Preparing For Surgery  Before surgery, you can play an important role. Because skin is not sterile, your skin needs to be as free of germs as possible. You can reduce the number of germs on your skin by washing with CHG (chlorahexidine gluconate) Soap before surgery.  CHG is an antiseptic cleaner which kills germs and bonds with  the skin to continue killing germs even after washing.  Please do not use if you have an allergy to CHG or antibacterial soaps. If your skin becomes reddened/irritated stop using the CHG.  Do not shave (including legs and underarms) for at least 48 hours prior to first CHG shower. It is OK to shave your face.  Please follow these instructions carefully.   1. Shower the NIGHT BEFORE SURGERY and the MORNING OF SURGERY with CHG.   2. If you chose to wash your hair, wash your hair first as usual with your normal shampoo.  3. After you shampoo, rinse your hair and body thoroughly to remove the shampoo.  4. Use CHG as you would any other liquid soap. You can apply CHG directly to the skin and wash gently with a scrungie or a clean washcloth.   5. Apply the CHG Soap to your body ONLY FROM THE NECK DOWN.  Do not use on open wounds or open sores. Avoid contact with your eyes, ears, mouth and genitals (private parts). Wash Face and genitals (private parts)  with your normal soap.  6. Wash thoroughly, paying special attention to the area where your surgery will be performed.  7. Thoroughly rinse your body with warm water from the neck down.  8. DO NOT shower/wash with your normal soap after using and rinsing off the CHG Soap.  9. Pat yourself dry with a CLEAN TOWEL.  10. Wear CLEAN PAJAMAS to bed the night before surgery, wear comfortable clothes the morning of  surgery  11. Place CLEAN SHEETS on your bed the night of your first shower and DO NOT SLEEP WITH PETS.    Day of Surgery: Do not apply any deodorants/lotions. Please wear clean clothes to the hospital/surgery center.      Please read over the following fact sheets that you were given. Coughing and Deep Breathing, Total Joint Packet, MRSA Information and Surgical Site Infection Prevention

## 2017-08-16 ENCOUNTER — Other Ambulatory Visit: Payer: Self-pay

## 2017-08-16 ENCOUNTER — Other Ambulatory Visit: Payer: Self-pay | Admitting: Family Medicine

## 2017-08-16 ENCOUNTER — Encounter (HOSPITAL_COMMUNITY): Payer: Self-pay

## 2017-08-16 ENCOUNTER — Encounter (HOSPITAL_COMMUNITY)
Admission: RE | Admit: 2017-08-16 | Discharge: 2017-08-16 | Disposition: A | Discharge status: Home / Self Care | Payer: 59 | Source: Ambulatory Visit | Attending: Orthopedic Surgery | Admitting: Orthopedic Surgery

## 2017-08-16 DIAGNOSIS — I10 Essential (primary) hypertension: Secondary | ICD-10-CM | POA: Insufficient documentation

## 2017-08-16 DIAGNOSIS — Z01812 Encounter for preprocedural laboratory examination: Secondary | ICD-10-CM | POA: Diagnosis not present

## 2017-08-16 DIAGNOSIS — Z01810 Encounter for preprocedural cardiovascular examination: Secondary | ICD-10-CM | POA: Insufficient documentation

## 2017-08-16 LAB — COMPREHENSIVE METABOLIC PANEL
ALBUMIN: 3.9 g/dL (ref 3.5–5.0)
ALT: 28 U/L (ref 17–63)
AST: 27 U/L (ref 15–41)
Alkaline Phosphatase: 74 U/L (ref 38–126)
Anion gap: 11 (ref 5–15)
BILIRUBIN TOTAL: 0.8 mg/dL (ref 0.3–1.2)
BUN: 18 mg/dL (ref 6–20)
CALCIUM: 9.3 mg/dL (ref 8.9–10.3)
CO2: 22 mmol/L (ref 22–32)
Chloride: 103 mmol/L (ref 101–111)
Creatinine, Ser: 0.96 mg/dL (ref 0.61–1.24)
GFR calc Af Amer: >60 mL/min (ref >60)
GFR calc non Af Amer: >60 mL/min (ref >60)
GLUCOSE: 100 mg/dL — AB (ref 65–99)
POTASSIUM: 4.1 mmol/L (ref 3.5–5.1)
Sodium: 136 mmol/L (ref 135–145)
TOTAL PROTEIN: 6.9 g/dL (ref 6.5–8.1)

## 2017-08-16 LAB — SURGICAL PCR SCREEN
MRSA, PCR: NEGATIVE
Staphylococcus aureus: NEGATIVE

## 2017-08-16 LAB — CBC
HEMATOCRIT: 42 % (ref 39.0–52.0)
HEMOGLOBIN: 14.5 g/dL (ref 13.0–17.0)
MCH: 31.7 pg (ref 26.0–34.0)
MCHC: 34.5 g/dL (ref 30.0–36.0)
MCV: 91.7 fL (ref 78.0–100.0)
Platelets: 339 10*3/uL (ref 150–400)
RBC: 4.58 MIL/uL (ref 4.22–5.81)
RDW: 12.1 % (ref 11.5–15.5)
WBC: 6.2 10*3/uL (ref 4.0–10.5)

## 2017-08-16 NOTE — Progress Notes (Signed)
PCP - Duane Lope  EKG - 08/16/2017  CXR: 06/23/17 Stress Test - 2002, normal  Aspirin Instructions: Pt states he was told to stop Aspirin 7 days prior to surgery  Patient denies shortness of breath, fever, cough and chest pain at PAT appointment  Patient verbalized understanding of instructions that were given to them at the PAT appointment. Patient was also instructed that they will need to review over the PAT instructions again at home before surgery.

## 2017-08-17 ENCOUNTER — Ambulatory Visit: Payer: Self-pay | Admitting: *Deleted

## 2017-08-17 ENCOUNTER — Telehealth: Payer: Self-pay | Admitting: Family Medicine

## 2017-08-17 NOTE — Telephone Encounter (Signed)
Pt was at work and could only talk briefly.   He saw Patrick Bailey on Friday for URI symptoms.   He is no better.   Still has all the same symptoms.  Coughing up green stuff and his voice is very hoarse and he is coughing while I was talking to him on the phone.    He didn't know if he was running fever or not and could not talk long due to being at work. He mentioned that Ms. Carlean Purl would call him in something else if he wasn't better when he saw her.  He is for surgery on Jan. 22nd and is concerned that he is still sick.   Been sick for 24 days now.  I routed a high priority note to [facility] practice making them aware of his situation.

## 2017-08-17 NOTE — Telephone Encounter (Addendum)
Seen 4 d ago, treated with 1 wk doxy course.  I tried to call  x2 went to voicemail, mailbox full.  plz call - he should still have 2 more days of doxycycline?  Is he having coughing fits or fevers >101 or productive cough or sweating?

## 2017-08-17 NOTE — Telephone Encounter (Signed)
Patrick Carrow RN also noted; He saw Ms. Patrick Bailey last Friday with URI.  He is for surgery Jan 22nd (knee) and is still sick with all the same symptoms.  He said she would call him in something else if he didn't get better.  He has been sick for 24 days now and is concerned with the upcoming surgery. (Routing comment)

## 2017-08-17 NOTE — Telephone Encounter (Signed)
D Gessner FNP out of office today.Please advise.  

## 2017-08-18 NOTE — Telephone Encounter (Signed)
Called, unable to reach patient - mailbox full. If you reach him please verify phone # as I cannot reach him at # provided. Have tried calling 4 times.  I want to know - is he having coughing fits or fever >101 or productive cough?  Should be finishing doxy course now.

## 2017-08-18 NOTE — Telephone Encounter (Signed)
Spoke with pt about cough.  Says he is having a lot of drainage which is triggering the cough but is coughing up the mucous. States he has felt feverish but not able to check it.  Finished doxycyline.

## 2017-08-19 MED ORDER — AZITHROMYCIN 250 MG PO TABS: ORAL_TABLET | ORAL | 0 refills | Status: DC

## 2017-08-19 NOTE — Telephone Encounter (Signed)
Spoke with pt notifying him a Zpack rx was sent to the pharmacy.  Expresses his thanks.

## 2017-08-19 NOTE — Addendum Note (Signed)
Addended by: Ria Bush on: 08/19/2017 07:36 AM   Modules accepted: Orders

## 2017-08-19 NOTE — Telephone Encounter (Signed)
Will rx zpack sent to pharmacy. Plz notify pt.

## 2017-08-23 MED ORDER — TRANEXAMIC ACID 1000 MG/10ML IV SOLN
1000.0000 mg | INTRAVENOUS | Status: AC
  Administered 2017-08-24: 1000 mg via INTRAVENOUS
  Filled 2017-08-23: qty 1100

## 2017-08-24 ENCOUNTER — Observation Stay (HOSPITAL_COMMUNITY): Payer: 59

## 2017-08-24 ENCOUNTER — Observation Stay (HOSPITAL_COMMUNITY)
Admission: RE | Admit: 2017-08-24 | Discharge: 2017-08-25 | Disposition: A | Discharge status: Home / Self Care | Payer: 59 | Source: Ambulatory Visit | Attending: Orthopedic Surgery | Admitting: Orthopedic Surgery

## 2017-08-24 ENCOUNTER — Inpatient Hospital Stay (HOSPITAL_COMMUNITY): Payer: 59 | Admitting: Anesthesiology

## 2017-08-24 ENCOUNTER — Encounter (HOSPITAL_COMMUNITY)
Admission: RE | Disposition: A | Discharge status: Home / Self Care | Payer: Self-pay | Source: Ambulatory Visit | Attending: Orthopedic Surgery

## 2017-08-24 ENCOUNTER — Encounter (HOSPITAL_COMMUNITY): Payer: Self-pay | Admitting: Anesthesiology

## 2017-08-24 DIAGNOSIS — M109 Gout, unspecified: Secondary | ICD-10-CM | POA: Diagnosis not present

## 2017-08-24 DIAGNOSIS — M170 Bilateral primary osteoarthritis of knee: Secondary | ICD-10-CM | POA: Diagnosis present

## 2017-08-24 DIAGNOSIS — M1120 Other chondrocalcinosis, unspecified site: Secondary | ICD-10-CM | POA: Diagnosis present

## 2017-08-24 DIAGNOSIS — Z96652 Presence of left artificial knee joint: Secondary | ICD-10-CM | POA: Diagnosis not present

## 2017-08-24 DIAGNOSIS — Z79899 Other long term (current) drug therapy: Secondary | ICD-10-CM | POA: Insufficient documentation

## 2017-08-24 DIAGNOSIS — G4700 Insomnia, unspecified: Secondary | ICD-10-CM | POA: Diagnosis not present

## 2017-08-24 DIAGNOSIS — Z87891 Personal history of nicotine dependence: Secondary | ICD-10-CM | POA: Insufficient documentation

## 2017-08-24 DIAGNOSIS — Z471 Aftercare following joint replacement surgery: Secondary | ICD-10-CM | POA: Diagnosis not present

## 2017-08-24 DIAGNOSIS — K219 Gastro-esophageal reflux disease without esophagitis: Secondary | ICD-10-CM | POA: Diagnosis not present

## 2017-08-24 DIAGNOSIS — M1712 Unilateral primary osteoarthritis, left knee: Secondary | ICD-10-CM

## 2017-08-24 DIAGNOSIS — I639 Cerebral infarction, unspecified: Secondary | ICD-10-CM

## 2017-08-24 DIAGNOSIS — Z85810 Personal history of malignant neoplasm of tongue: Secondary | ICD-10-CM | POA: Diagnosis not present

## 2017-08-24 DIAGNOSIS — E039 Hypothyroidism, unspecified: Secondary | ICD-10-CM | POA: Insufficient documentation

## 2017-08-24 DIAGNOSIS — Z96659 Presence of unspecified artificial knee joint: Secondary | ICD-10-CM

## 2017-08-24 DIAGNOSIS — I10 Essential (primary) hypertension: Secondary | ICD-10-CM | POA: Diagnosis not present

## 2017-08-24 DIAGNOSIS — E781 Pure hyperglyceridemia: Secondary | ICD-10-CM | POA: Diagnosis present

## 2017-08-24 DIAGNOSIS — G8918 Other acute postprocedural pain: Secondary | ICD-10-CM | POA: Diagnosis not present

## 2017-08-24 DIAGNOSIS — M1710 Unilateral primary osteoarthritis, unspecified knee: Secondary | ICD-10-CM | POA: Diagnosis present

## 2017-08-24 DIAGNOSIS — Z8673 Personal history of transient ischemic attack (TIA), and cerebral infarction without residual deficits: Secondary | ICD-10-CM | POA: Diagnosis not present

## 2017-08-24 DIAGNOSIS — Z85819 Personal history of malignant neoplasm of unspecified site of lip, oral cavity, and pharynx: Secondary | ICD-10-CM | POA: Diagnosis present

## 2017-08-24 DIAGNOSIS — T84093A Other mechanical complication of internal left knee prosthesis, initial encounter: Secondary | ICD-10-CM | POA: Diagnosis not present

## 2017-08-24 DIAGNOSIS — Z7982 Long term (current) use of aspirin: Secondary | ICD-10-CM | POA: Diagnosis not present

## 2017-08-24 DIAGNOSIS — T84498A Other mechanical complication of other internal orthopedic devices, implants and grafts, initial encounter: Secondary | ICD-10-CM | POA: Diagnosis not present

## 2017-08-24 HISTORY — PX: TOTAL KNEE ARTHROPLASTY: SHX125

## 2017-08-24 HISTORY — PX: HARDWARE REMOVAL: SHX979

## 2017-08-24 SURGERY — ARTHROPLASTY, KNEE, TOTAL
Anesthesia: Spinal | Site: Knee | Laterality: Left

## 2017-08-24 MED ORDER — AMLODIPINE BESYLATE 10 MG PO TABS
10.0000 mg | ORAL_TABLET | Freq: Every day | ORAL | Status: DC
  Administered 2017-08-25: 10 mg via ORAL
  Filled 2017-08-24: qty 1

## 2017-08-24 MED ORDER — MIDAZOLAM HCL 2 MG/2ML IJ SOLN
2.0000 mg | Freq: Once | INTRAMUSCULAR | Status: AC
  Administered 2017-08-24: 2 mg via INTRAVENOUS

## 2017-08-24 MED ORDER — DOCUSATE SODIUM 100 MG PO CAPS
100.0000 mg | ORAL_CAPSULE | Freq: Two times a day (BID) | ORAL | Status: DC
  Administered 2017-08-24 – 2017-08-25 (×2): 100 mg via ORAL
  Filled 2017-08-24 (×2): qty 1

## 2017-08-24 MED ORDER — METOCLOPRAMIDE HCL 5 MG PO TABS: 5.0000 mg | ORAL_TABLET | Freq: Three times a day (TID) | ORAL | Status: DC | PRN

## 2017-08-24 MED ORDER — LACTATED RINGERS IV SOLN
INTRAVENOUS | Status: DC
  Administered 2017-08-24: 08:00:00 via INTRAVENOUS

## 2017-08-24 MED ORDER — DEXAMETHASONE SODIUM PHOSPHATE 10 MG/ML IJ SOLN
INTRAMUSCULAR | Status: DC | PRN
  Administered 2017-08-24: 10 mg via INTRAVENOUS

## 2017-08-24 MED ORDER — CELECOXIB 200 MG PO CAPS
200.0000 mg | ORAL_CAPSULE | Freq: Two times a day (BID) | ORAL | Status: DC
  Administered 2017-08-24 – 2017-08-25 (×2): 200 mg via ORAL
  Filled 2017-08-24 (×2): qty 1

## 2017-08-24 MED ORDER — ROPIVACAINE HCL 5 MG/ML IJ SOLN
INTRAMUSCULAR | Status: DC | PRN
  Administered 2017-08-24 (×6): 5 mL via PERINEURAL

## 2017-08-24 MED ORDER — LACTATED RINGERS IV SOLN
INTRAVENOUS | Status: DC
  Administered 2017-08-24 (×2): via INTRAVENOUS

## 2017-08-24 MED ORDER — FENOFIBRATE 160 MG PO TABS
160.0000 mg | ORAL_TABLET | Freq: Every day | ORAL | Status: DC
  Administered 2017-08-25: 160 mg via ORAL
  Filled 2017-08-24: qty 1

## 2017-08-24 MED ORDER — HYDROMORPHONE HCL 1 MG/ML IJ SOLN
0.2500 mg | INTRAMUSCULAR | Status: DC | PRN
  Administered 2017-08-24 (×2): 0.5 mg via INTRAVENOUS

## 2017-08-24 MED ORDER — KETOROLAC TROMETHAMINE 30 MG/ML IJ SOLN
30.0000 mg | Freq: Once | INTRAMUSCULAR | Status: DC | PRN
  Administered 2017-08-24: 30 mg via INTRAVENOUS

## 2017-08-24 MED ORDER — METHOCARBAMOL 500 MG PO TABS
500.0000 mg | ORAL_TABLET | Freq: Four times a day (QID) | ORAL | Status: DC | PRN
  Administered 2017-08-24 – 2017-08-25 (×3): 500 mg via ORAL
  Filled 2017-08-24 (×3): qty 1

## 2017-08-24 MED ORDER — SODIUM CHLORIDE FLUSH 0.9 % IV SOLN
INTRAVENOUS | Status: DC | PRN
  Administered 2017-08-24 (×2): 10 mL

## 2017-08-24 MED ORDER — AMITRIPTYLINE HCL 50 MG PO TABS
50.0000 mg | ORAL_TABLET | Freq: Every day | ORAL | Status: DC
  Administered 2017-08-24: 50 mg via ORAL
  Filled 2017-08-24: qty 1

## 2017-08-24 MED ORDER — FENTANYL CITRATE (PF) 100 MCG/2ML IJ SOLN
100.0000 ug | Freq: Once | INTRAMUSCULAR | Status: AC
  Administered 2017-08-24: 100 ug via INTRAVENOUS

## 2017-08-24 MED ORDER — METHOCARBAMOL 500 MG PO TABS: 500.0000 mg | ORAL_TABLET | Freq: Four times a day (QID) | ORAL | 0 refills | Status: DC | PRN

## 2017-08-24 MED ORDER — ONDANSETRON HCL 4 MG/2ML IJ SOLN
INTRAMUSCULAR | Status: AC
  Filled 2017-08-24: qty 2

## 2017-08-24 MED ORDER — SODIUM CHLORIDE 0.9 % IR SOLN
Status: DC | PRN
  Administered 2017-08-24: 3000 mL

## 2017-08-24 MED ORDER — OXYCODONE HCL 5 MG PO TABS
ORAL_TABLET | ORAL | Status: AC
  Filled 2017-08-24: qty 2

## 2017-08-24 MED ORDER — OXYCODONE HCL 5 MG PO TABS
5.0000 mg | ORAL_TABLET | ORAL | Status: DC | PRN
  Administered 2017-08-25: 5 mg via ORAL
  Filled 2017-08-24: qty 1

## 2017-08-24 MED ORDER — ATORVASTATIN CALCIUM 40 MG PO TABS: 40.0000 mg | ORAL_TABLET | Freq: Every day | ORAL | Status: DC

## 2017-08-24 MED ORDER — ACETAMINOPHEN 325 MG PO TABS
650.0000 mg | ORAL_TABLET | ORAL | Status: DC | PRN
  Administered 2017-08-24: 650 mg via ORAL
  Filled 2017-08-24: qty 2

## 2017-08-24 MED ORDER — PROPOFOL 10 MG/ML IV BOLUS
INTRAVENOUS | Status: AC
  Filled 2017-08-24: qty 20

## 2017-08-24 MED ORDER — OXYCODONE HCL 5 MG PO TABS: 5.0000 mg | ORAL_TABLET | ORAL | 0 refills | Status: AC | PRN

## 2017-08-24 MED ORDER — FENTANYL CITRATE (PF) 250 MCG/5ML IJ SOLN
INTRAMUSCULAR | Status: AC
  Filled 2017-08-24: qty 5

## 2017-08-24 MED ORDER — ASPIRIN EC 325 MG PO TBEC: 325.0000 mg | DELAYED_RELEASE_TABLET | Freq: Every day | ORAL | 0 refills | Status: DC

## 2017-08-24 MED ORDER — GABAPENTIN 300 MG PO CAPS
300.0000 mg | ORAL_CAPSULE | Freq: Once | ORAL | Status: AC
  Administered 2017-08-24: 300 mg via ORAL

## 2017-08-24 MED ORDER — CEFAZOLIN SODIUM-DEXTROSE 2-4 GM/100ML-% IV SOLN
2.0000 g | INTRAVENOUS | Status: AC
  Administered 2017-08-24: 2 g via INTRAVENOUS

## 2017-08-24 MED ORDER — LEVOTHYROXINE SODIUM 75 MCG PO TABS
150.0000 ug | ORAL_TABLET | Freq: Every day | ORAL | Status: DC
  Administered 2017-08-25: 150 ug via ORAL
  Filled 2017-08-24: qty 2

## 2017-08-24 MED ORDER — ACETAMINOPHEN 650 MG RE SUPP: 650.0000 mg | RECTAL | Status: DC | PRN

## 2017-08-24 MED ORDER — METOCLOPRAMIDE HCL 5 MG/ML IJ SOLN: 5.0000 mg | Freq: Three times a day (TID) | INTRAMUSCULAR | Status: DC | PRN

## 2017-08-24 MED ORDER — EPHEDRINE SULFATE 50 MG/ML IJ SOLN
INTRAMUSCULAR | Status: DC | PRN
  Administered 2017-08-24 (×3): 10 mg via INTRAVENOUS

## 2017-08-24 MED ORDER — LIDOCAINE HCL (CARDIAC) 20 MG/ML IV SOLN
INTRAVENOUS | Status: DC | PRN
Start: 2017-08-24 — End: 2017-08-24
  Administered 2017-08-24: 100 mg via INTRATRACHEAL

## 2017-08-24 MED ORDER — CEFAZOLIN SODIUM-DEXTROSE 2-4 GM/100ML-% IV SOLN
INTRAVENOUS | Status: AC
  Filled 2017-08-24: qty 100

## 2017-08-24 MED ORDER — DEXAMETHASONE SODIUM PHOSPHATE 10 MG/ML IJ SOLN
INTRAMUSCULAR | Status: AC
  Filled 2017-08-24: qty 1

## 2017-08-24 MED ORDER — HYDROMORPHONE HCL 1 MG/ML IJ SOLN: 0.5000 mg | INTRAMUSCULAR | Status: DC | PRN

## 2017-08-24 MED ORDER — ONDANSETRON HCL 4 MG PO TABS
4.0000 mg | ORAL_TABLET | Freq: Four times a day (QID) | ORAL | Status: DC | PRN
  Administered 2017-08-24: 4 mg via ORAL
  Filled 2017-08-24: qty 1

## 2017-08-24 MED ORDER — SENNA 8.6 MG PO TABS
1.0000 | ORAL_TABLET | Freq: Two times a day (BID) | ORAL | Status: DC
  Administered 2017-08-24 – 2017-08-25 (×2): 8.6 mg via ORAL
  Filled 2017-08-24 (×2): qty 1

## 2017-08-24 MED ORDER — CHLORHEXIDINE GLUCONATE 4 % EX LIQD: 60.0000 mL | Freq: Once | CUTANEOUS | Status: DC

## 2017-08-24 MED ORDER — DEXAMETHASONE SODIUM PHOSPHATE 10 MG/ML IJ SOLN
10.0000 mg | Freq: Once | INTRAMUSCULAR | Status: AC
  Administered 2017-08-25: 10 mg via INTRAVENOUS
  Filled 2017-08-24: qty 1

## 2017-08-24 MED ORDER — SORBITOL 70 % SOLN: 30.0000 mL | Freq: Every day | Status: DC | PRN

## 2017-08-24 MED ORDER — ACETAMINOPHEN 500 MG PO TABS
1000.0000 mg | ORAL_TABLET | Freq: Once | ORAL | Status: AC
  Administered 2017-08-24: 1000 mg via ORAL

## 2017-08-24 MED ORDER — ACETAMINOPHEN 500 MG PO TABS
ORAL_TABLET | ORAL | Status: AC
  Administered 2017-08-24: 1000 mg via ORAL
  Filled 2017-08-24: qty 2

## 2017-08-24 MED ORDER — MEPERIDINE HCL 25 MG/ML IJ SOLN: 6.2500 mg | INTRAMUSCULAR | Status: DC | PRN

## 2017-08-24 MED ORDER — PHENYLEPHRINE HCL 10 MG/ML IJ SOLN
INTRAVENOUS | Status: DC | PRN
  Administered 2017-08-24: 50 ug/min via INTRAVENOUS

## 2017-08-24 MED ORDER — LACTATED RINGERS IV SOLN
INTRAVENOUS | Status: DC
  Administered 2017-08-24: 18:00:00 via INTRAVENOUS

## 2017-08-24 MED ORDER — PHENYLEPHRINE HCL 10 MG/ML IJ SOLN
INTRAMUSCULAR | Status: DC | PRN
  Administered 2017-08-24: 40 ug via INTRAVENOUS
  Administered 2017-08-24: 80 ug via INTRAVENOUS
  Administered 2017-08-24: 40 ug via INTRAVENOUS
  Administered 2017-08-24: 120 ug via INTRAVENOUS
  Administered 2017-08-24 (×2): 40 ug via INTRAVENOUS

## 2017-08-24 MED ORDER — PHENOL 1.4 % MT LIQD: 1.0000 | OROMUCOSAL | Status: DC | PRN

## 2017-08-24 MED ORDER — MENTHOL 3 MG MT LOZG: 1.0000 | LOZENGE | OROMUCOSAL | Status: DC | PRN

## 2017-08-24 MED ORDER — CELECOXIB 200 MG PO CAPS: 200.0000 mg | ORAL_CAPSULE | Freq: Two times a day (BID) | ORAL | 0 refills | Status: DC

## 2017-08-24 MED ORDER — KETOROLAC TROMETHAMINE 30 MG/ML IJ SOLN
INTRAMUSCULAR | Status: DC | PRN
  Administered 2017-08-24: 30 mg

## 2017-08-24 MED ORDER — ASPIRIN EC 325 MG PO TBEC
325.0000 mg | DELAYED_RELEASE_TABLET | Freq: Every day | ORAL | Status: DC
  Administered 2017-08-25: 325 mg via ORAL
  Filled 2017-08-24: qty 1

## 2017-08-24 MED ORDER — DOCUSATE SODIUM 100 MG PO CAPS: 100.0000 mg | ORAL_CAPSULE | Freq: Two times a day (BID) | ORAL | 0 refills | Status: DC

## 2017-08-24 MED ORDER — HYDROMORPHONE HCL 1 MG/ML IJ SOLN
INTRAMUSCULAR | Status: AC
  Filled 2017-08-24: qty 1

## 2017-08-24 MED ORDER — BUPIVACAINE HCL (PF) 0.25 % IJ SOLN
INTRAMUSCULAR | Status: AC
  Filled 2017-08-24: qty 30

## 2017-08-24 MED ORDER — KETOROLAC TROMETHAMINE 30 MG/ML IJ SOLN
INTRAMUSCULAR | Status: AC
  Filled 2017-08-24: qty 1

## 2017-08-24 MED ORDER — DEXTROSE 5 % IV SOLN
500.0000 mg | Freq: Four times a day (QID) | INTRAVENOUS | Status: DC | PRN
  Filled 2017-08-24: qty 5

## 2017-08-24 MED ORDER — PROPOFOL 10 MG/ML IV BOLUS
INTRAVENOUS | Status: DC | PRN
  Administered 2017-08-24: 160 mg via INTRAVENOUS

## 2017-08-24 MED ORDER — ONDANSETRON HCL 4 MG PO TABS: 4.0000 mg | ORAL_TABLET | Freq: Three times a day (TID) | ORAL | 0 refills | Status: DC | PRN

## 2017-08-24 MED ORDER — OXYCODONE HCL 5 MG PO TABS
10.0000 mg | ORAL_TABLET | ORAL | Status: DC | PRN
  Administered 2017-08-24 – 2017-08-25 (×7): 10 mg via ORAL
  Filled 2017-08-24 (×5): qty 2

## 2017-08-24 MED ORDER — ACETAMINOPHEN 500 MG PO TABS: 1000.0000 mg | ORAL_TABLET | Freq: Three times a day (TID) | ORAL | 0 refills | Status: AC

## 2017-08-24 MED ORDER — ACETAMINOPHEN 500 MG PO TABS
1000.0000 mg | ORAL_TABLET | Freq: Three times a day (TID) | ORAL | Status: DC
  Administered 2017-08-24 – 2017-08-25 (×2): 1000 mg via ORAL
  Filled 2017-08-24 (×2): qty 2

## 2017-08-24 MED ORDER — DIPHENHYDRAMINE HCL 12.5 MG/5ML PO ELIX: 12.5000 mg | ORAL_SOLUTION | ORAL | Status: DC | PRN

## 2017-08-24 MED ORDER — ONDANSETRON HCL 4 MG/2ML IJ SOLN
INTRAMUSCULAR | Status: DC | PRN
  Administered 2017-08-24: 4 mg via INTRAVENOUS

## 2017-08-24 MED ORDER — FENTANYL CITRATE (PF) 100 MCG/2ML IJ SOLN
INTRAMUSCULAR | Status: DC | PRN
  Administered 2017-08-24 (×4): 25 ug via INTRAVENOUS
  Administered 2017-08-24 (×2): 50 ug via INTRAVENOUS

## 2017-08-24 MED ORDER — FENTANYL CITRATE (PF) 100 MCG/2ML IJ SOLN
INTRAMUSCULAR | Status: AC
  Administered 2017-08-24: 100 ug via INTRAVENOUS
  Filled 2017-08-24: qty 2

## 2017-08-24 MED ORDER — BUPIVACAINE HCL (PF) 0.25 % IJ SOLN
INTRAMUSCULAR | Status: DC | PRN
  Administered 2017-08-24: 30 mL

## 2017-08-24 MED ORDER — GABAPENTIN 300 MG PO CAPS
ORAL_CAPSULE | ORAL | Status: AC
  Administered 2017-08-24: 300 mg via ORAL
  Filled 2017-08-24: qty 1

## 2017-08-24 MED ORDER — 0.9 % SODIUM CHLORIDE (POUR BTL) OPTIME
TOPICAL | Status: DC | PRN
  Administered 2017-08-24: 1000 mL

## 2017-08-24 MED ORDER — CEFAZOLIN SODIUM-DEXTROSE 1-4 GM/50ML-% IV SOLN
1.0000 g | Freq: Four times a day (QID) | INTRAVENOUS | Status: AC
  Administered 2017-08-25: 1 g via INTRAVENOUS
  Filled 2017-08-24 (×2): qty 50

## 2017-08-24 MED ORDER — METOPROLOL TARTRATE 100 MG PO TABS
100.0000 mg | ORAL_TABLET | Freq: Two times a day (BID) | ORAL | Status: DC
  Administered 2017-08-24 – 2017-08-25 (×2): 100 mg via ORAL
  Filled 2017-08-24 (×2): qty 1

## 2017-08-24 MED ORDER — MIDAZOLAM HCL 2 MG/2ML IJ SOLN
INTRAMUSCULAR | Status: AC
  Administered 2017-08-24: 2 mg via INTRAVENOUS
  Filled 2017-08-24: qty 2

## 2017-08-24 MED ORDER — POLYETHYLENE GLYCOL 3350 17 G PO PACK: 17.0000 g | PACK | Freq: Every day | ORAL | Status: DC | PRN

## 2017-08-24 MED ORDER — PROMETHAZINE HCL 25 MG/ML IJ SOLN: 6.2500 mg | INTRAMUSCULAR | Status: DC | PRN

## 2017-08-24 MED ORDER — SODIUM FLUORIDE 1.1 % DT GEL: 1.0000 "application " | Freq: Every day | DENTAL | Status: DC

## 2017-08-24 MED ORDER — ONDANSETRON HCL 4 MG/2ML IJ SOLN: 4.0000 mg | Freq: Four times a day (QID) | INTRAMUSCULAR | Status: DC | PRN

## 2017-08-24 MED ORDER — PANTOPRAZOLE SODIUM 40 MG PO TBEC
40.0000 mg | DELAYED_RELEASE_TABLET | Freq: Every day | ORAL | Status: DC
  Administered 2017-08-25: 40 mg via ORAL
  Filled 2017-08-24: qty 1

## 2017-08-24 SURGICAL SUPPLY — 55 items
BANDAGE ESMARK 6X9 LF (GAUZE/BANDAGES/DRESSINGS) ×2 IMPLANT
BLADE SAG 18X100X1.27 (BLADE) ×6 IMPLANT
BNDG CMPR 9X6 STRL LF SNTH (GAUZE/BANDAGES/DRESSINGS) ×1
BNDG ELASTIC 6X10 VLCR STRL LF (GAUZE/BANDAGES/DRESSINGS) ×3 IMPLANT
BNDG ELASTIC 6X15 VLCR STRL LF (GAUZE/BANDAGES/DRESSINGS) ×3 IMPLANT
BNDG ESMARK 6X9 LF (GAUZE/BANDAGES/DRESSINGS) ×3
BOWL SMART MIX CTS (DISPOSABLE) ×3 IMPLANT
CAPT KNEE TRIATH TK-4 ×3 IMPLANT
CLSR STERI-STRIP ANTIMIC 1/2X4 (GAUZE/BANDAGES/DRESSINGS) ×3 IMPLANT
COVER SURGICAL LIGHT HANDLE (MISCELLANEOUS) ×3 IMPLANT
CUFF TOURNIQUET SINGLE 34IN LL (TOURNIQUET CUFF) ×3 IMPLANT
DRAPE C-ARM 42X72 X-RAY (DRAPES) ×3 IMPLANT
DRAPE EXTREMITY T 121X128X90 (DRAPE) ×3 IMPLANT
DRAPE HALF SHEET 40X57 (DRAPES) ×3 IMPLANT
DRAPE U-SHAPE 47X51 STRL (DRAPES) ×3 IMPLANT
DRSG MEPILEX BORDER 4X12 (GAUZE/BANDAGES/DRESSINGS) ×3 IMPLANT
DRSG MEPILEX BORDER 4X4 (GAUZE/BANDAGES/DRESSINGS) ×3 IMPLANT
DRSG MEPILEX BORDER 4X8 (GAUZE/BANDAGES/DRESSINGS) ×3 IMPLANT
DURAPREP 26ML APPLICATOR (WOUND CARE) ×6 IMPLANT
ELECT CAUTERY BLADE 6.4 (BLADE) ×3 IMPLANT
ELECT REM PT RETURN 9FT ADLT (ELECTROSURGICAL) ×3
ELECTRODE REM PT RTRN 9FT ADLT (ELECTROSURGICAL) ×2 IMPLANT
FACESHIELD WRAPAROUND (MASK) ×6 IMPLANT
GLOVE BIO SURGEON STRL SZ7.5 (GLOVE) ×6 IMPLANT
GLOVE BIOGEL PI IND STRL 8 (GLOVE) ×4 IMPLANT
GLOVE BIOGEL PI INDICATOR 8 (GLOVE) ×2
GLOVE SURG ORTHO 8.0 STRL STRW (GLOVE) ×3 IMPLANT
GOWN STRL REUS W/ TWL LRG LVL3 (GOWN DISPOSABLE) ×4 IMPLANT
GOWN STRL REUS W/ TWL XL LVL3 (GOWN DISPOSABLE) ×2 IMPLANT
GOWN STRL REUS W/TWL LRG LVL3 (GOWN DISPOSABLE) ×2
GOWN STRL REUS W/TWL XL LVL3 (GOWN DISPOSABLE) ×1
HANDPIECE INTERPULSE COAX TIP (DISPOSABLE)
IMMOBILIZER KNEE 22 UNIV (SOFTGOODS) ×3 IMPLANT
KIT BASIN OR (CUSTOM PROCEDURE TRAY) ×3 IMPLANT
KIT ROOM TURNOVER OR (KITS) ×3 IMPLANT
MANIFOLD NEPTUNE II (INSTRUMENTS) ×3 IMPLANT
NEEDLE 22X1 1/2 (OR ONLY) (NEEDLE) ×3 IMPLANT
NS IRRIG 1000ML POUR BTL (IV SOLUTION) ×3 IMPLANT
PACK TOTAL JOINT (CUSTOM PROCEDURE TRAY) ×3 IMPLANT
PAD ARMBOARD 7.5X6 YLW CONV (MISCELLANEOUS) ×3 IMPLANT
SET HNDPC FAN SPRY TIP SCT (DISPOSABLE) IMPLANT
SUCTION FRAZIER HANDLE 10FR (MISCELLANEOUS) ×1
SUCTION TUBE FRAZIER 10FR DISP (MISCELLANEOUS) ×2 IMPLANT
SUT MNCRL AB 4-0 PS2 18 (SUTURE) ×6 IMPLANT
SUT MON AB 2-0 CT1 27 (SUTURE) ×3 IMPLANT
SUT MON AB 2-0 CT1 36 (SUTURE) ×3 IMPLANT
SUT VIC AB 0 CT1 27 (SUTURE) ×1
SUT VIC AB 0 CT1 27XBRD ANBCTR (SUTURE) ×2 IMPLANT
SUT VIC AB 1 CT1 27 (SUTURE) ×2
SUT VIC AB 1 CT1 27XBRD ANBCTR (SUTURE) ×4 IMPLANT
SYR 50ML LL SCALE MARK (SYRINGE) ×3 IMPLANT
TOWEL OR 17X24 6PK STRL BLUE (TOWEL DISPOSABLE) ×3 IMPLANT
TOWEL OR 17X26 10 PK STRL BLUE (TOWEL DISPOSABLE) ×3 IMPLANT
TRAY CATH 16FR W/PLASTIC CATH (SET/KITS/TRAYS/PACK) IMPLANT
TRAY FOLEY BAG SILVER LF 14FR (CATHETERS) IMPLANT

## 2017-08-24 NOTE — Anesthesia Procedure Notes (Addendum)
Anesthesia Regional Block: Adductor canal block   Pre-Anesthetic Checklist: ,, timeout performed, Correct Patient, Correct Site, Correct Laterality, Correct Procedure, Correct Position, site marked, Risks and benefits discussed,  Surgical consent,  Pre-op evaluation,  At surgeon's request and post-op pain management  Laterality: Left     Needles:  Injection technique: Single-shot  Needle Type: Echogenic Stimulator Needle     Needle Length: 9cm  Needle Gauge: 21     Additional Needles:   Procedures:,,,, ultrasound used (permanent image in chart),,,,  Narrative:  Start time: 08/24/2017 8:50 AM End time: 08/24/2017 9:00 AM Injection made incrementally with aspirations every 5 mL.  Performed by: Personally  Anesthesiologist: Lyn Hollingshead, MD

## 2017-08-24 NOTE — Op Note (Signed)
DATE OF SURGERY:  08/24/2017 TIME: 11:50 AM  PATIENT NAME:  Patrick Bailey.   AGE: 57 y.o.    PRE-OPERATIVE DIAGNOSIS:  OA LEFT KNEE  POST-OPERATIVE DIAGNOSIS:  Same  PROCEDURE:  Procedure(s): LEFT TOTAL KNEE ARTHROPLASTY LEFT KNEE HARDWARE REMOVAL   SURGEON:  Torez Beauregard, Ernesta Amble, MD   ASSISTANT:  Roxan Hockey, PA-C, he was present and scrubbed throughout the case, critical for completion in a timely fashion, and for retraction, instrumentation, and closure.    OPERATIVE IMPLANTS: Stryker Triathlon Posterior Stabilized. Press fit knee  Femur size 5, Tibia size 5, Patella size 32 3-peg oval button, with a 9 mm polyethylene insert.   PREOPERATIVE INDICATIONS:  Patrick Bailey. is a 57 y.o. year old male with end stage bone on bone degenerative arthritis of the knee who failed conservative treatment, including injections, antiinflammatories, activity modification, and assistive devices, and had significant impairment of their activities of daily living, and elected for Total Knee Arthroplasty.   The risks, benefits, and alternatives were discussed at length including but not limited to the risks of infection, bleeding, nerve injury, stiffness, blood clots, the need for revision surgery, cardiopulmonary complications, among others, and they were willing to proceed.   OPERATIVE DESCRIPTION:  The patient was brought to the operative room and placed in a supine position.  General anesthesia was administered.  IV antibiotics were given.  The lower extremity was prepped and draped in the usual sterile fashion.  Time out was performed.  The leg was elevated and exsanguinated and the tourniquet was inflated.  Anterior approach was performed.  The patella was everted and osteophytes were removed.  The anterior horn of the medial and lateral meniscus was removed.   The distal femur was opened with the drill and the intramedullary distal femoral cutting jig was utilized, set at 5  degrees resecting 8 mm off the distal femur.  Care was taken to protect the collateral ligaments.  The distal femoral sizing jig was applied, taking care to avoid notching.  Then the 4-in-1 cutting jig was applied and the anterior and posterior femur was cut, along with the chamfer cuts.  All posterior osteophytes were removed.  The flexion gap was then measured and was symmetric with the extension gap.  Then the extramedullary tibial cutting jig was utilized making the appropriate cut using the anterior tibial crest as a reference building in appropriate posterior slope.  Care was taken during the cut to protect the medial and collateral ligaments.  The proximal tibia was removed along with the posterior horns of the menisci.  The PCL was sacrificed.    The extensor gap was measured and was approximately 60mm.    I completed the distal femoral preparation using the appropriate jig to prepare the box.  The patella was then measured, and cut with the saw.    The proximal tibia sized and prepared accordingly with the reamer and the punch, and then all components were trialed with the above sized poly insert.  The knee was found to have excellent balance and full motion.    The above named components were then impacted into place and Poly tibial piece and patella were inserted.  I was very happy with his stability and ROM  I performed a periarticular injection with marcaine and toradol  The knee was easily taken through a range of motion and the patella tracked well and the knee irrigated copiously and the parapatellar and subcutaneous tissue closed with vicryl, and monocryl  with steri strips for the skin.  The incision was dressed with sterile gauze and the tourniquet released and the patient was awakened and returned to the PACU in stable and satisfactory condition.  There were no complications.  Total tourniquet time was roughly 75 minutes.   POSTOPERATIVE PLAN: post op Abx, DVT px: SCD's,  TED's, Early ambulation and chemical px

## 2017-08-24 NOTE — Anesthesia Preprocedure Evaluation (Addendum)
Anesthesia Evaluation  Patient identified by MRN, date of birth, ID band Patient awake    Reviewed: Allergy & Precautions, NPO status , Patient's Chart, lab work & pertinent test results  Airway Mallampati: I       Dental no notable dental hx. (+) Teeth Intact   Pulmonary former smoker,    Pulmonary exam normal breath sounds clear to auscultation       Cardiovascular hypertension, Pt. on medications and Pt. on home beta blockers Normal cardiovascular exam Rhythm:Regular Rate:Normal     Neuro/Psych    GI/Hepatic GERD  Medicated and Controlled,  Endo/Other    Renal/GU   negative genitourinary   Musculoskeletal   Abdominal Normal abdominal exam  (+)   Peds  Hematology negative hematology ROS (+)   Anesthesia Other Findings   Reproductive/Obstetrics                             Anesthesia Physical  Anesthesia Plan  ASA: II  Anesthesia Plan: General   Post-op Pain Management:  Regional for Post-op pain   Induction: Intravenous  PONV Risk Score and Plan: 1 and Ondansetron  Airway Management Planned: Oral ETT  Additional Equipment:   Intra-op Plan:   Post-operative Plan: Extubation in OR  Informed Consent: I have reviewed the patients History and Physical, chart, labs and discussed the procedure including the risks, benefits and alternatives for the proposed anesthesia with the patient or authorized representative who has indicated his/her understanding and acceptance.       Plan Discussed with: CRNA and Surgeon  Anesthesia Plan Comments:         Anesthesia Quick Evaluation  

## 2017-08-24 NOTE — Transfer of Care (Signed)
Immediate Anesthesia Transfer of Care Note  Patient: Patrick Bailey.  Procedure(s) Performed: LEFT TOTAL KNEE ARTHROPLASTY (Left Knee) LEFT KNEE HARDWARE REMOVAL (Left )  Patient Location: PACU  Anesthesia Type:GA combined with regional for post-op pain  Level of Consciousness: awake, alert , oriented and patient cooperative  Airway & Oxygen Therapy: Patient Spontanous Breathing  Post-op Assessment: Report given to RN and Post -op Vital signs reviewed and stable  Post vital signs: Reviewed and stable  Last Vitals:  Vitals:   08/24/17 0855 08/24/17 1235  BP: 107/77   Pulse: 66   Resp: 16   Temp:  (!) (P) 36.3 C  SpO2: 98%     Last Pain:  Vitals:   08/24/17 1235  TempSrc:   PainSc: (P) 0-No pain         Complications: No apparent anesthesia complications

## 2017-08-24 NOTE — Anesthesia Postprocedure Evaluation (Signed)
Anesthesia Post Note  Patient: Patrick Bailey.  Procedure(s) Performed: LEFT TOTAL KNEE ARTHROPLASTY (Left Knee) LEFT KNEE HARDWARE REMOVAL (Left )     Patient location during evaluation: PACU Anesthesia Type: General Level of consciousness: awake Pain management: pain level controlled Vital Signs Assessment: post-procedure vital signs reviewed and stable Respiratory status: spontaneous breathing Cardiovascular status: stable Postop Assessment: no apparent nausea or vomiting Anesthetic complications: no    Last Vitals:  Vitals:   08/24/17 1304 08/24/17 1319  BP: (!) 133/96 110/81  Pulse: 92 93  Resp: 16 12  Temp:    SpO2: 96% 96%    Last Pain:  Vitals:   08/24/17 1310  TempSrc:   PainSc: 5    Pain Goal: Patients Stated Pain Goal: 5 (08/24/17 1310)  LLE Motor Response: Purposeful movement (08/24/17 1315) LLE Sensation: Full sensation (08/24/17 1315) RLE Motor Response: Purposeful movement (08/24/17 1315) RLE Sensation: Full sensation (08/24/17 1315)      Malcolm

## 2017-08-24 NOTE — Progress Notes (Signed)
Orthopedic Tech Progress Note Patient Details:  Patrick Bailey 13-Oct-1960 [id number]  CPM Left Knee CPM Left Knee: On Left Knee Flexion (Degrees): 90 Left Knee Extension (Degrees): 0 Additional Comments: Left knee.  pt tolerated application very well.  bone foam provided at bedside.   Left knee.     Kristopher Oppenheim 08/24/2017, 1:19 PM

## 2017-08-24 NOTE — Interval H&P Note (Signed)
History and Physical Interval Note:  08/24/2017 3:11 PM  Patrick Bailey.  has presented today for surgery, with the diagnosis of OA LEFT KNEE  The various methods of treatment have been discussed with the patient and family. After consideration of risks, benefits and other options for treatment, the patient has consented to  Procedure(s): LEFT TOTAL KNEE ARTHROPLASTY (Left) LEFT KNEE HARDWARE REMOVAL (Left) as a surgical intervention .  The patient's history has been reviewed, patient examined, no change in status, stable for surgery.  I have reviewed the patient's chart and labs.  Questions were answered to the patient's satisfaction.     Rudolf Blizard D

## 2017-08-24 NOTE — Discharge Instructions (Signed)
INSTRUCTIONS AFTER JOINT REPLACEMENT   o Remove items at home which could result in a fall. This includes throw rugs or furniture in walking pathways o ICE to the affected joint every three hours while awake for 30 minutes at a time, for at least the first 3-5 days, and then as needed for pain and swelling.  Continue to use ice for pain and swelling. You may notice swelling that will progress down to the foot and ankle.  This is normal after surgery.  Elevate your leg when you are not up walking on it.   o Continue to use the breathing machine you got in the hospital (incentive spirometer) which will help keep your temperature down.  It is common for your temperature to cycle up and down following surgery, especially at night when you are not up moving around and exerting yourself.  The breathing machine keeps your lungs expanded and your temperature down.   DIET:  As you were doing prior to hospitalization, we recommend a well-balanced diet.  DRESSING / WOUND CARE / SHOWERING  You may shower 3 days after surgery, but keep the wounds dry during showering.  You may use an occlusive plastic wrap (Press'n Seal for example) with blue painter's tape at edges, NO SOAKING/SUBMERGING IN THE BATHTUB.  If the bandage gets wet, change with a clean dry gauze.  If the incision gets wet, pat the wound dry with a clean towel.  ACTIVITY  o Increase activity slowly as tolerated, but follow the weight bearing instructions below.   o No driving for 6 weeks or until further direction given by your physician.  You cannot drive while taking narcotics.  o No lifting or carrying greater than 10 lbs. until further directed by your surgeon. o Avoid periods of inactivity such as sitting longer than an hour when not asleep. This helps prevent blood clots.  o You may return to work once you are authorized by your doctor.     WEIGHT BEARING   Weight bearing as tolerated with assist device (walker, cane, etc) as directed,  use it as long as suggested by your surgeon or therapist, typically at least 4-6 weeks.   EXERCISES  Results after joint replacement surgery are often greatly improved when you follow the exercise, range of motion and muscle strengthening exercises prescribed by your doctor. Safety measures are also important to protect the joint from further injury. Any time any of these exercises cause you to have increased pain or swelling, decrease what you are doing until you are comfortable again and then slowly increase them. If you have problems or questions, call your caregiver or physical therapist for advice.   Rehabilitation is important following a joint replacement. After just a few days of immobilization, the muscles of the leg can become weakened and shrink (atrophy).  These exercises are designed to build up the tone and strength of the thigh and leg muscles and to improve motion. Often times heat used for twenty to thirty minutes before working out will loosen up your tissues and help with improving the range of motion but do not use heat for the first two weeks following surgery (sometimes heat can increase post-operative swelling).   These exercises can be done on a training (exercise) mat, on the floor, on a table or on a bed. Use whatever works the best and is most comfortable for you.    Use music or television while you are exercising so that the exercises are a pleasant  break in your day. This will make your life better with the exercises acting as a break in your routine that you can look forward to.   Perform all exercises about fifteen times, three times per day or as directed.  You should exercise both the operative leg and the other leg as well.  Exercises include:    Quad Sets - Tighten up the muscle on the front of the thigh (Quad) and hold for 5-10 seconds.    Straight Leg Raises - With your knee straight (if you were given a brace, keep it on), lift the leg to 60 degrees, hold for 3  seconds, and slowly lower the leg.  Perform this exercise against resistance later as your leg gets stronger.   Leg Slides: Lying on your back, slowly slide your foot toward your buttocks, bending your knee up off the floor (only go as far as is comfortable). Then slowly slide your foot back down until your leg is flat on the floor again.   Angel Wings: Lying on your back spread your legs to the side as far apart as you can without causing discomfort.   Hamstring Strength:  Lying on your back, push your heel against the floor with your leg straight by tightening up the muscles of your buttocks.  Repeat, but this time bend your knee to a comfortable angle, and push your heel against the floor.  You may put a pillow under the heel to make it more comfortable if necessary.   A rehabilitation program following joint replacement surgery can speed recovery and prevent re-injury in the future due to weakened muscles. Contact your doctor or a physical therapist for more information on knee rehabilitation.    CONSTIPATION  Constipation is defined medically as fewer than three stools per week and severe constipation as less than one stool per week.  Even if you have a regular bowel pattern at home, your normal regimen is likely to be disrupted due to multiple reasons following surgery.  Combination of anesthesia, postoperative narcotics, change in appetite and fluid intake all can affect your bowels.   YOU MUST use at least one of the following options; they are listed in order of increasing strength to get the job done.  They are all available over the counter, and you may need to use some, POSSIBLY even all of these options:    Drink plenty of fluids (prune juice may be helpful) and high fiber foods Colace 100 mg by mouth twice a day  Senokot for constipation as directed and as needed Dulcolax (bisacodyl), take with full glass of water  Miralax (polyethylene glycol) once or twice a day as needed.  If  you have tried all these things and are unable to have a bowel movement in the first 3-4 days after surgery call either your surgeon or your primary doctor.    If you experience loose stools or diarrhea, hold the medications until you stool forms back up.  If your symptoms do not get better within 1 week or if they get worse, check with your doctor.  If you experience "the worst abdominal pain ever" or develop nausea or vomiting, please contact the office immediately for further recommendations for treatment.   ITCHING:  If you experience itching with your medications, try taking only a single pain pill, or even half a pain pill at a time.  You can also use Benadryl over the counter for itching or also to help with sleep.  TED HOSE STOCKINGS:  Use stockings on both legs until for at least 2 weeks or as directed by physician office. They may be removed at night for sleeping.  MEDICATIONS:  See your medication summary on the After Visit Summary that nursing will review with you.  You may have some home medications which will be placed on hold until you complete the course of blood thinner medication.  It is important for you to complete the blood thinner medication as prescribed.  PRECAUTIONS:  If you experience chest pain or shortness of breath - call 911 immediately for transfer to the hospital emergency department.   If you develop a fever greater that 101 F, purulent drainage from wound, increased redness or drainage from wound, foul odor from the wound/dressing, or calf pain - CONTACT YOUR SURGEON.                                                   FOLLOW-UP APPOINTMENTS:  If you do not already have a post-op appointment, please call the office for an appointment to be seen by your surgeon.  Guidelines for how soon to be seen are listed in your After Visit Summary, but are typically between 1-4 weeks after surgery.  OTHER INSTRUCTIONS:   Knee Replacement:  Do not place pillow under knee,  focus on keeping the knee straight while resting. CPM instructions: 0-90 degrees, 2 hours in the morning, 2 hours in the afternoon, and 2 hours in the evening. Place foam block, curve side up under heel at all times except when in CPM or when walking.  DO NOT modify, tear, cut, or change the foam block in any way.  MAKE SURE YOU:   Understand these instructions.   Get help right away if you are not doing well or get worse.    Thank you for letting us be a part of your medical care team.  It is a privilege we respect greatly.  We hope these instructions will help you stay on track for a fast and full recovery!

## 2017-08-24 NOTE — Anesthesia Procedure Notes (Signed)
Procedure Name: LMA Insertion Date/Time: 08/24/2017 10:26 AM Performed by: Lance Coon, CRNA Pre-anesthesia Checklist: Emergency Drugs available, Suction available, Patient being monitored, Timeout performed and Patient identified Patient Re-evaluated:Patient Re-evaluated prior to induction Oxygen Delivery Method: Circle system utilized Preoxygenation: Pre-oxygenation with 100% oxygen Induction Type: IV induction LMA: LMA inserted LMA Size: 4.0 Number of attempts: 1 Placement Confirmation: positive ETCO2 and breath sounds checked- equal and bilateral Tube secured with: Tape Dental Injury: Teeth and Oropharynx as per pre-operative assessment

## 2017-08-25 ENCOUNTER — Encounter (HOSPITAL_COMMUNITY): Payer: Self-pay | Admitting: Orthopedic Surgery

## 2017-08-25 DIAGNOSIS — M1712 Unilateral primary osteoarthritis, left knee: Secondary | ICD-10-CM | POA: Diagnosis not present

## 2017-08-25 DIAGNOSIS — T84498A Other mechanical complication of other internal orthopedic devices, implants and grafts, initial encounter: Secondary | ICD-10-CM | POA: Diagnosis not present

## 2017-08-25 NOTE — Evaluation (Signed)
Physical Therapy Evaluation Patient Details Name: Patrick Bailey. 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  Talked with him about safety and reviewed handout of precautions including car transfers.  He is initially resistant then more open to education about HEP and safety.  Has a visitor who is planning to assist in transition home and will see this afternoon for BID visit if he is not home yet.      Follow Up Recommendations Supervision - Intermittent;DC plan and follow up therapy as arranged by surgeon    Equipment Recommendations  Rolling walker with 5" wheels    Recommendations for Other Services       Precautions / Restrictions Precautions Precautions: Fall(Has L knee immobilizer with no order) Precaution Comments: refused immobilizer Required Braces or Orthoses: (immobilizer is not ordered but in room ) Restrictions Weight Bearing Restrictions: Yes Other Position/Activity Restrictions: WBAT      Mobility  Bed Mobility Overal bed mobility: Modified Independent             General bed mobility comments: extra time and effort to side of bed out of CPM and back  Transfers Overall transfer level: Needs assistance Equipment used: Rolling walker (2 wheeled);1 person hand held assist Transfers: Sit to/from Stand Sit to Stand: Supervision         General transfer comment: supervised for safety while pt was resistant to using walker and brace  Ambulation/Gait Ambulation/Gait assistance: Min guard(for safety due to pain meds) Ambulation Distance (Feet): 140 Feet Assistive device: Rolling walker (2 wheeled);1 person hand held assist Gait Pattern/deviations:  Step-through pattern;Decreased stride length;Wide base of support;Antalgic Gait velocity: reduced Gait velocity interpretation: Below normal speed for age/gender General Gait Details: Pt was able to walk with RW but cued for posture, safety and speed to decrease fall risk as he is declining to use non ordered immobilizer in his room.    Stairs Stairs: (declined)          Engineer, building services [place] (Stroke Patients Only)       Balance Overall balance assessment: Needs assistance Sitting-balance support: Feet supported Sitting balance-Leahy Scale: Good     Standing balance support: Bilateral upper extremity supported;During functional activity Standing balance-Leahy Scale: Fair                               Pertinent Vitals/Pain Pain Assessment: Faces Faces Pain Scale: Hurts little more Pain Location: L knee Pain Descriptors / Indicators: Operative site guarding;Aching Pain Intervention(s): Limited activity within patient's tolerance;Monitored during session;Premedicated before session;Repositioned;Ice applied    Home Living Family/patient expects to be discharged to:: Private residence Living Arrangements: Alone Available Help at Discharge: Family;Available PRN/intermittently Type of Home: House Home Access: Level entry     Home Layout: One level Home Equipment: Walker - 2 wheels;Bedside commode;Crutches Additional Comments: pt is expecting to get a TED stocking for LLE    Prior Function Level of Independence: Independent         Comments: was working as a Press photographer [employer]   Dominant Hand:  Right    Extremity/Trunk Assessment   Upper Extremity Assessment Upper Extremity Assessment: Overall WFL for tasks assessed    Lower Extremity Assessment Lower Extremity Assessment: LLE deficits/detail LLE Deficits / Details: L Knee TKA with stiffness to extend mainly LLE Coordination: decreased gross  motor;decreased fine motor    Cervical / Trunk Assessment Cervical / Trunk Assessment: Normal  Communication   Communication: No difficulties  Cognition Arousal/Alertness: Awake/alert Behavior During Therapy: Impulsive Overall Cognitive Status: No family/caregiver present to determine baseline cognitive functioning                                 General Comments: pt was impulsive about movement and resistant initially to limitations from PT      General Comments General comments (skin integrity, edema, etc.): pt is confident about mobility but asked him to decrease speed and use safety information    Exercises Total Joint Exercises Ankle Circles/Pumps: AROM;Both;5 reps Quad Sets: AROM;Both;10 reps Straight Leg Raises: AROM;AAROM;5 reps;Both   Assessment/Plan    PT Assessment Patient needs continued PT services  PT Problem List Decreased strength;Decreased range of motion;Decreased activity tolerance;Decreased balance;Decreased mobility;Decreased coordination;Decreased knowledge of use of DME;Decreased safety awareness;Decreased skin integrity;Pain       PT Treatment Interventions DME instruction;Gait training;Functional mobility training;Therapeutic activities;Therapeutic exercise;Balance training;Neuromuscular re-education;Patient/family education    PT Goals (Current goals can be found in the Care Plan section)  Acute Rehab PT Goals Patient Stated Goal: to walk and get back to work PT Goal Formulation: With patient Time For Goal Achievement: 09/08/17 Potential to Achieve Goals: Good    Frequency 7X/week   Barriers to discharge Decreased caregiver support home alone but family involved    Co-evaluation               AM-PAC PT "6 Clicks" Daily Activity  Outcome Measure Difficulty turning over in bed (including adjusting bedclothes, sheets and blankets)?: A Little Difficulty moving from lying on back to sitting on the side of the bed? : A  Little Difficulty sitting down on and standing up from a chair with arms (e.g., wheelchair, bedside commode, etc,.)?: A Little Help needed moving to and from a bed to chair (including a wheelchair)?: A Little Help needed walking in hospital room?: A Little Help needed climbing 3-5 steps with a railing? : A Little 6 Click Score: 18    End of Session   Activity Tolerance: Patient tolerated treatment well;Patient limited by fatigue;No increased pain Patient left: in bed;with call bell/phone within reach;Other (comment)(CPM resumed with ice replaced) Nurse Communication: Mobility status;Other (comment)(ice replaced) PT Visit Diagnosis: Unsteadiness on feet (R26.81);Muscle weakness (generalized) (M62.81);Pain;Difficulty in walking, not elsewhere classified (R26.2) Pain - Right/Left: Left Pain - part of body: Knee    Time: 1100-1132 PT Time Calculation (min) (ACUTE ONLY): 32 min   Charges:   PT Evaluation $PT Eval Moderate Complexity: 1 Mod PT Treatments $Gait Training: 8-22 mins   PT G Codes:   PT G-Codes **NOT FOR INPATIENT CLASS** Functional Assessment Tool Used: AM-PAC 6 Clicks Basic Mobility    Ramond Dial 08/25/2017, 12:02 PM   Mee Hives, PT MS Acute Rehab Dept. Number: [place] [facility] [place]

## 2017-08-25 NOTE — Progress Notes (Signed)
Orthopedic Tech Progress Note Patient Details:  Patrick Bailey 01-22-1961 [id number]  Patient ID: Patrick Ochoa., male   DOB: Feb 09, 1961, 57 y.o.   MRN: [id number] Pt will call when ready for cpm.  Karolee Stamps 08/25/2017, 5:54 AM

## 2017-08-25 NOTE — Evaluation (Signed)
Occupational Therapy Evaluation Patient Details Name: Patrick Bailey. MRN: [id number] DOB: June 24, 1961 Today's Date: 08/25/2017    History of Present Illness 57 yo male with failed conservative measures for L OA now has TKA with PMHx:  L tibial plateau fracture, TIA from cerebellum, gout   Clinical Impression   Pt doing well and is able to completed ADLs Mod I, using RW with cues for safety (impulsive). Pt anxious and ready to d/c home today. All education completed and no furhter acute OT is indicated at this time    Follow Up Recommendations  DC plan and follow up therapy as arranged by surgeon;Supervision - Intermittent    Equipment Recommendations  None recommended by OT    Recommendations for Other Services       Precautions / Restrictions Precautions Precautions: Fall Precaution Comments: reviewed pillow under knee Required Braces or Orthoses: (immobilizer is not ordered but in room ) Restrictions Weight Bearing Restrictions: Yes Other Position/Activity Restrictions: WBAT      Mobility Bed Mobility Overal bed mobility: Modified Independent             General bed mobility comments: extra time and effort to side of bed out of CPM and back  Transfers Overall transfer level: Needs assistance Equipment used: Rolling walker (2 wheeled);1 person hand held assist Transfers: Sit to/from Stand Sit to Stand: Supervision         General transfer comment: supervised for safety while pt was resistant to using walker and brace    Balance Overall balance assessment: Needs assistance Sitting-balance support: Feet supported;No upper extremity supported Sitting balance-Leahy Scale: Good     Standing balance support: Bilateral upper extremity supported;During functional activity;Single extremity supported Standing balance-Leahy Scale: Fair                             ADL either performed or assessed with clinical judgement   ADL Overall ADL's :  Modified independent                                       General ADL Comments: pt able to fully dress self without assist     Vision Patient Visual Report: No change from baseline       Perception     Praxis      Pertinent Vitals/Pain Pain Assessment: 0-10 Pain Score: 3  Faces Pain Scale: Hurts little more Pain Location: L knee Pain Descriptors / Indicators: Operative site guarding;Aching Pain Intervention(s): Monitored during session;Premedicated before session;Repositioned     Hand Dominance Right   Extremity/Trunk Assessment Upper Extremity Assessment Upper Extremity Assessment: Overall WFL for tasks assessed   Lower Extremity Assessment Lower Extremity Assessment: Defer to PT evaluation LLE Deficits / Details: L Knee TKA with stiffness to extend mainly LLE Coordination: decreased gross motor;decreased fine motor   Cervical / Trunk Assessment Cervical / Trunk Assessment: Normal   Communication Communication Communication: No difficulties   Cognition Arousal/Alertness: Awake/alert Behavior During Therapy: Impulsive Overall Cognitive Status: Within Functional Limits for tasks assessed                                 General Comments: pt was impulsive about movement and resistant initially to limitations from PT   General Comments  pt is confident about mobility but asked  him to decrease speed and use safety information    Exercises Exercises: Total Joint Total Joint Exercises Ankle Circles/Pumps: AROM;Both;5 reps Quad Sets: AROM;Both;10 reps Straight Leg Raises: AROM;AAROM;5 reps;Both   Shoulder Instructions      Home Living Family/patient expects to be discharged to:: Private residence Living Arrangements: Alone Available Help at Discharge: Family;Available PRN/intermittently Type of Home: House Home Access: Level entry     Home Layout: One level         Bathroom Toilet: Standard     Home Equipment: Walker -  2 wheels;Bedside commode;Crutches   Additional Comments: pt is expecting to get a TED stocking for LLE      Prior Functioning/Environment Level of Independence: Independent        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, music        OT Problem List: Impaired balance (sitting and/or standing);Pain      OT Treatment/Interventions:      OT Goals(Current goals can be found in the care plan section) Acute Rehab OT Goals Patient Stated Goal: to walk and get back to work OT Goal Formulation: With patient  OT Frequency:     Barriers to D/C:    no barriers       Co-evaluation              AM-PAC PT "6 Clicks" Daily Activity     Outcome Measure Help from another person eating meals?: None Help from another person taking care of personal grooming?: None Help from another person toileting, which includes using toliet, bedpan, or urinal?: None Help from another person bathing (including washing, rinsing, drying)?: None Help from another person to put on and taking off regular upper body clothing?: None Help from another person to put on and taking off regular lower body clothing?: None 6 Click Score: 24   End of Session Equipment Utilized During Treatment: Rolling walker CPM Left Knee CPM Left Knee: Off Left Knee Flexion (Degrees): 90 Left Knee Extension (Degrees): 0  Activity Tolerance:   Patient left: in bed(sitting EOB)  OT Visit Diagnosis: Unsteadiness on feet (R26.81);Pain Pain - Right/Left: Left Pain - part of body: Knee                Time: 1610-9604 OT Time Calculation (min): 15 min Charges:  OT General Charges $OT Visit: 1 Visit OT Evaluation $OT Eval Low Complexity: 1 Low G-Codes: OT G-codes **NOT FOR INPATIENT CLASS** Functional Assessment Tool Used: AM-PAC 6 Clicks Daily Activity     Britt Bottom 08/25/2017, 12:10 PM

## 2017-08-25 NOTE — Discharge Summary (Signed)
Discharge Summary  Patient ID: Patrick Bailey. MRN: [id number] DOB/AGE: 1960/12/01 57 y.o.  Admit date: 08/24/2017 Discharge date: 08/25/2017  Admission Diagnoses:  Primary localized osteoarthritis of left knee  Discharge Diagnoses:  Principal Problem:   Primary localized osteoarthritis of left knee Active Problems:   History of throat cancer   Essential hypertension   Calcium pyrophosphate crystal disease   Hypertriglyceridemia   Cerebellar stroke ([place])   Primary osteoarthritis of knee   Past Medical History:  Diagnosis Date  . Abnormality of gait 07/17/2016  . Alcohol use   . Arthritis   . Cerebellar stroke ([place]) 07/17/2016   Presumed, causing gait abnormality s/p eval by neuro [name]) 07/2016 overall improving.   Marland Kitchen CPDD (calcium pyrophosphate deposition disease) 10/2013   R hand xray, L knee xray  . GERD (gastroesophageal reflux disease)   . Gout   . History of smoking   . HTN (hypertension)   . Hypertension   . Hypothyroidism (acquired)   . Insomnia   . Throat cancer ([place])    Dr. Caryl Pina [facility])  . TOBACCO ABUSE, HX OF 02/05/2009   Quit 2011      Surgeries: Procedure(s): LEFT TOTAL KNEE ARTHROPLASTY LEFT KNEE HARDWARE REMOVAL on 08/24/2017   Consultants (if any):   Discharged Condition: Improved  Hospital Course: Kartier Bennison. is an 57 y.o. male who was admitted 08/24/2017 with a diagnosis of Primary localized osteoarthritis of left knee and went to the operating room on 08/24/2017 and underwent the above named procedures.    He was given perioperative antibiotics:  Anti-infectives (From admission, onward)   Start     Dose/Rate Route Frequency Ordered Stop   08/24/17 1800  ceFAZolin (ANCEF) IVPB 1 g/50 mL premix     1 g 100 mL/hr over 30 Minutes Intravenous Every 6 hours 08/24/17 1412 08/25/17 0559   08/24/17 0801  ceFAZolin (ANCEF) 2-4 GM/100ML-% IVPB    Comments:  Rosenberger, Meredit: cabinet override      08/24/17 0801 08/24/17 1031   08/24/17  0759  ceFAZolin (ANCEF) IVPB 2g/100 mL premix     2 g 200 mL/hr over 30 Minutes Intravenous On call to O.R. 08/24/17 0759 08/24/17 1031    .  He was given sequential compression devices, early ambulation, and Aspirin for DVT prophylaxis.  He benefited maximally from the hospital stay and there were no complications.    Recent vital signs:  Vitals:   08/25/17 0018 08/25/17 0408  BP: 121/78 125/78  Pulse: 88 86  Resp: 18 18  Temp: 97.9 F (36.6 C) 97.6 F (36.4 C)  SpO2: 97% (!) 7%    Recent laboratory studies:  Lab Results  Component Value Date   HGB 14.5 08/16/2017   HGB 14.8 07/14/2017   HGB 15.4 07/11/2016   Lab Results  Component Value Date   WBC 6.2 08/16/2017   PLT 339 08/16/2017   Lab Results  Component Value Date   INR 1.0 07/14/2017   Lab Results  Component Value Date   NA 136 08/16/2017   K 4.1 08/16/2017   CL 103 08/16/2017   CO2 22 08/16/2017   BUN 18 08/16/2017   CREATININE 0.96 08/16/2017   GLUCOSE 100 (H) 08/16/2017    Discharge Medications:   Allergies as of 08/25/2017   No Known Allergies     Medication List    STOP taking these medications   azithromycin 250 MG tablet Commonly known as:  ZITHROMAX   doxycycline 100 MG capsule Commonly  known as:  VIBRAMYCIN   HYDROcodone-homatropine 5-1.5 MG/5ML syrup Commonly known as:  HYCODAN     TAKE these medications   acetaminophen 500 MG tablet Commonly known as:  TYLENOL Take 2 tablets (1,000 mg total) by mouth every 8 (eight) hours for 14 days. For Pain.   ADVIL PM PO Take 1 tablet by mouth at bedtime as needed (SLEEP).   amitriptyline 50 MG tablet Commonly known as:  ELAVIL TAKE ONE TABLET BY MOUTH AT BEDTIME   amLODipine 10 MG tablet Commonly known as:  NORVASC TAKE 1 TABLET BY MOUTH ONCE DAILY   aspirin EC 325 MG tablet Take 1 tablet (325 mg total) by mouth daily. For 30 days post op for DVT Prophylaxis What changed:    medication strength  how much to  take  additional instructions   atorvastatin 40 MG tablet Commonly known as:  LIPITOR Take 1 tablet (40 mg total) by mouth daily.   benzonatate 100 MG capsule Commonly known as:  TESSALON Take 1-2 capsules (100-200 mg total) by mouth 3 (three) times daily as needed for cough.   celecoxib 200 MG capsule Commonly known as:  CELEBREX Take 1 capsule (200 mg total) by mouth 2 (two) times daily. For 2 weeks post op for pain and inflammation.  Discontinue Ibuprofen or other Anti-inflammatory medicine when taking this medicine.   DENTAGEL 1.1 % Gel dental gel Generic drug:  sodium fluoride Place 1 application onto teeth at bedtime.   docusate sodium 100 MG capsule Commonly known as:  COLACE Take 1 capsule (100 mg total) by mouth 2 (two) times daily. To prevent constipation while taking pain medication.   fenofibrate 160 MG tablet Take 1 tablet (160 mg total) by mouth daily.   fish oil-omega-3 fatty acids 1000 MG capsule Take 1 g by mouth daily.   levothyroxine 150 MCG tablet Commonly known as:  SYNTHROID, LEVOTHROID Take 1 tablet (150 mcg total) by mouth daily.   methocarbamol 500 MG tablet Commonly known as:  ROBAXIN Take 1 tablet (500 mg total) by mouth every 6 (six) hours as needed for muscle spasms.   metoprolol tartrate 100 MG tablet Commonly known as:  LOPRESSOR TAKE ONE TABLET BY MOUTH TWICE DAILY What changed:    how much to take  how to take this  when to take this  additional instructions   MITIGARE 0.6 MG Caps Generic drug:  Colchicine TAKE 1 CAPSULE BY MOUTH IN THE MORNING AS NEEDED What changed:  See the new instructions.   omeprazole 20 MG capsule Commonly known as:  PRILOSEC Take 20 mg by mouth daily.   ondansetron 4 MG tablet Commonly known as:  ZOFRAN Take 1 tablet (4 mg total) by mouth every 8 (eight) hours as needed for nausea or vomiting.   oxyCODONE 5 MG immediate release tablet Commonly known as:  ROXICODONE Take 1-2 tablets (5-10 mg  total) by mouth every 4 (four) hours as needed for breakthrough pain.       Diagnostic Studies: Dg Knee Left Port  Result Date: 08/24/2017 CLINICAL DATA:  Total left knee replacement. EXAM: PORTABLE LEFT KNEE - 1-2 VIEW COMPARISON:  12/25/2016. FINDINGS: Total left knee replacement. Hardware intact. Anatomic alignment. No acute bony abnormality. IMPRESSION: Total left knee replacement with anatomic alignment. Electronically Signed   By: Marcello Moores  Register   On: 08/24/2017 13:13    Disposition: 01-Home or Self Care  Discharge Instructions    Discharge patient   Complete by:  As directed    After  therapy evaluations   Discharge disposition:  01-Home or Self Care   Discharge patient date:  08/25/2017      Follow-up Information    Renette Butters, MD.   Specialty:  Orthopedic Surgery Contact information: [address] [place] [address] [phone]            Signed: Prudencio Burly III PA-C 08/25/2017, 8:02 AM

## 2017-08-25 NOTE — Progress Notes (Signed)
    Subjective: Patient reports pain as mild to moderate.  Tolerating diet.  Urinating.  +Flatus.  No CP, SOB.  OOB mobilizing independently in the room with his walker.  Objective:   VITALS:   Vitals:   08/24/17 1715 08/24/17 2054 08/25/17 0018 08/25/17 0408  BP: (!) 139/111 (!) 143/75 121/78 125/78  Pulse: (!) 112 (!) 128 88 86  Resp: 19 19 18 18   Temp: (!) 97.5 F (36.4 C) 97.9 F (36.6 C) 97.9 F (36.6 C) 97.6 F (36.4 C)  TempSrc: Oral Oral Oral Oral  SpO2: 95% 98% 97% (!) 7%   CBC Latest Ref Rng & Units 08/16/2017 07/14/2017 07/11/2016  WBC 4.0 - 10.5 K/uL 6.2 6.4 6.0  Hemoglobin 13.0 - 17.0 g/dL 14.5 14.8 15.4  Hematocrit 39.0 - 52.0 % 42.0 43.5 43.7  Platelets 150 - 400 K/uL 339 278.0 256   BMP Latest Ref Rng & Units 08/16/2017 07/14/2017 12/11/2016  Glucose 65 - 99 mg/dL 100(H) 84 98  BUN 6 - 20 mg/dL 18 12 13   Creatinine 0.61 - 1.24 mg/dL 0.96 1.07 1.07  Sodium 135 - 145 mmol/L 136 139 137  Potassium 3.5 - 5.1 mmol/L 4.1 4.3 4.6  Chloride 101 - 111 mmol/L 103 101 102  CO2 22 - 32 mmol/L 22 30 30   Calcium 8.9 - 10.3 mg/dL 9.3 9.6 9.5   Intake/Output      01/22 0701 - 01/23 0700 01/23 0701 - 01/24 0700   P.O. 480    I.V. 4091.7    IV Piggyback 50    Total Intake 4621.7    Urine 1450    Blood 150    Total Output 1600    Net +3021.7            Physical Exam: General: NAD.  Walking back to bed firm restroom on arrival.  No increased work of breathing MSK Neurovascularly intact Sensation intact distally Intact pulses distally Dorsiflexion/Plantar flexion intact Incision: dressing C/D/I  Assessment / Plan: 1 Day Post-Op  S/P Procedure(s) (LRB): LEFT TOTAL KNEE ARTHROPLASTY (Left) LEFT KNEE HARDWARE REMOVAL (Left) by Dr. Ernesta Amble. Murphy on 08/24/2017  Principal Problem:   Primary localized osteoarthritis of left knee Active Problems:   History of throat cancer   Essential hypertension   Calcium pyrophosphate crystal disease  Hypertriglyceridemia   Cerebellar stroke ([facility])   Primary osteoarthritis of knee   Status post left total knee arthroplasty AFVSN.  Doing well.   Eating, drinking, and voiding.  Pain controlled.  Mobilizing well in room.    Up with therapy D/C IV fluids Incentive Spirometry Elevate and apply ice CPM and bone foam  Weight Bearing: Weight Bearing as Tolerated (WBAT)  Dressings: Maintain Mepilex.  VTE prophylaxis: Aspirin, SCDs, ambulation Dispo: Home today after therapy evaluations   Prudencio Burly III, PA-C 08/25/2017, 7:57 AM

## 2017-08-25 NOTE — Progress Notes (Signed)
RN gave Pt discharge instructions pt stated understanding. Pt not in any distress equipment and family at bedside.

## 2017-08-26 DIAGNOSIS — Z471 Aftercare following joint replacement surgery: Secondary | ICD-10-CM | POA: Diagnosis not present

## 2017-08-26 DIAGNOSIS — M109 Gout, unspecified: Secondary | ICD-10-CM | POA: Diagnosis not present

## 2017-08-26 DIAGNOSIS — I10 Essential (primary) hypertension: Secondary | ICD-10-CM | POA: Diagnosis not present

## 2017-08-31 DIAGNOSIS — M1712 Unilateral primary osteoarthritis, left knee: Secondary | ICD-10-CM | POA: Diagnosis not present

## 2017-09-01 DIAGNOSIS — Z471 Aftercare following joint replacement surgery: Secondary | ICD-10-CM | POA: Diagnosis not present

## 2017-09-01 DIAGNOSIS — M109 Gout, unspecified: Secondary | ICD-10-CM | POA: Diagnosis not present

## 2017-09-01 DIAGNOSIS — I10 Essential (primary) hypertension: Secondary | ICD-10-CM | POA: Diagnosis not present

## 2017-09-07 DIAGNOSIS — I10 Essential (primary) hypertension: Secondary | ICD-10-CM | POA: Diagnosis not present

## 2017-09-07 DIAGNOSIS — Z471 Aftercare following joint replacement surgery: Secondary | ICD-10-CM | POA: Diagnosis not present

## 2017-09-07 DIAGNOSIS — M109 Gout, unspecified: Secondary | ICD-10-CM | POA: Diagnosis not present

## 2017-09-11 DIAGNOSIS — M109 Gout, unspecified: Secondary | ICD-10-CM | POA: Diagnosis not present

## 2017-09-11 DIAGNOSIS — I10 Essential (primary) hypertension: Secondary | ICD-10-CM | POA: Diagnosis not present

## 2017-09-11 DIAGNOSIS — Z471 Aftercare following joint replacement surgery: Secondary | ICD-10-CM | POA: Diagnosis not present

## 2017-09-15 DIAGNOSIS — M25562 Pain in left knee: Secondary | ICD-10-CM | POA: Diagnosis not present

## 2017-09-15 DIAGNOSIS — Z471 Aftercare following joint replacement surgery: Secondary | ICD-10-CM | POA: Diagnosis not present

## 2017-09-15 DIAGNOSIS — M1712 Unilateral primary osteoarthritis, left knee: Secondary | ICD-10-CM | POA: Diagnosis not present

## 2017-09-17 ENCOUNTER — Ambulatory Visit
Admit: 2017-09-17 | Discharge: 2017-09-18 | Payer: PRIVATE HEALTH INSURANCE | Attending: General Practice | Primary: General Practice

## 2017-09-17 DIAGNOSIS — Z0120 Encounter for dental examination and cleaning without abnormal findings: Principal | ICD-10-CM

## 2017-09-18 ENCOUNTER — Other Ambulatory Visit: Payer: Self-pay | Admitting: Family Medicine

## 2017-09-21 DIAGNOSIS — M1712 Unilateral primary osteoarthritis, left knee: Secondary | ICD-10-CM | POA: Diagnosis not present

## 2017-09-21 DIAGNOSIS — Z471 Aftercare following joint replacement surgery: Secondary | ICD-10-CM | POA: Diagnosis not present

## 2017-09-21 DIAGNOSIS — M25562 Pain in left knee: Secondary | ICD-10-CM | POA: Diagnosis not present

## 2017-09-24 DIAGNOSIS — Z471 Aftercare following joint replacement surgery: Secondary | ICD-10-CM | POA: Diagnosis not present

## 2017-09-24 DIAGNOSIS — M25562 Pain in left knee: Secondary | ICD-10-CM | POA: Diagnosis not present

## 2017-09-24 DIAGNOSIS — M1712 Unilateral primary osteoarthritis, left knee: Secondary | ICD-10-CM | POA: Diagnosis not present

## 2017-09-28 DIAGNOSIS — M1712 Unilateral primary osteoarthritis, left knee: Secondary | ICD-10-CM | POA: Diagnosis not present

## 2017-09-28 DIAGNOSIS — M25562 Pain in left knee: Secondary | ICD-10-CM | POA: Diagnosis not present

## 2017-09-28 DIAGNOSIS — Z471 Aftercare following joint replacement surgery: Secondary | ICD-10-CM | POA: Diagnosis not present

## 2017-09-30 ENCOUNTER — Encounter: Payer: Self-pay | Admitting: Gastroenterology

## 2017-09-30 ENCOUNTER — Other Ambulatory Visit: Payer: Self-pay | Admitting: Family Medicine

## 2017-09-30 DIAGNOSIS — M1712 Unilateral primary osteoarthritis, left knee: Secondary | ICD-10-CM | POA: Diagnosis not present

## 2017-09-30 DIAGNOSIS — M25562 Pain in left knee: Secondary | ICD-10-CM | POA: Diagnosis not present

## 2017-09-30 DIAGNOSIS — Z471 Aftercare following joint replacement surgery: Secondary | ICD-10-CM | POA: Diagnosis not present

## 2017-10-01 ENCOUNTER — Other Ambulatory Visit: Payer: Self-pay | Admitting: Family Medicine

## 2017-10-01 DIAGNOSIS — E039 Hypothyroidism, unspecified: Secondary | ICD-10-CM

## 2017-10-01 MED ORDER — LEVOTHYROXINE SODIUM 150 MCG PO TABS: 150.0000 ug | ORAL_TABLET | Freq: Every day | ORAL | 0 refills | Status: DC

## 2017-10-01 NOTE — Telephone Encounter (Signed)
  levothyroxine refill Last OV: 07/14/16 Last Refill:09/30/17 Pharmacy:[facility].  Pharmacist asking if it is ok to change manufacturers.

## 2017-10-01 NOTE — Telephone Encounter (Signed)
Copied from [place]. Topic: Quick Communication - Rx Refill/Question >> Oct 01, 2017 10:28 AM Burnis Medin, NT wrote: Medication: levothyroxine (SYNTHROID, LEVOTHROID) 150 MCG tablet   Has the patient contacted their pharmacy? Yes. Several request has been sent to office. Pt has been out off medication for a few days.    (Agent: If no, request that the patient contact the pharmacy for the refill.)  [place], [place] [phone] (Phone) [phone] (Fax)   Preferred Pharmacy (with phone number or street name):    Agent: Please be advised that RX refills may take up to 3 business days. We ask that you follow-up with your pharmacy.

## 2017-10-01 NOTE — Telephone Encounter (Signed)
Ok to [employer], pt will need to come in for lab visit for rpt TFTs, ordered

## 2017-10-05 ENCOUNTER — Telehealth: Payer: Self-pay

## 2017-10-05 DIAGNOSIS — E039 Hypothyroidism, unspecified: Secondary | ICD-10-CM

## 2017-10-05 DIAGNOSIS — M25671 Stiffness of right ankle, not elsewhere classified: Secondary | ICD-10-CM | POA: Diagnosis not present

## 2017-10-05 DIAGNOSIS — M1712 Unilateral primary osteoarthritis, left knee: Secondary | ICD-10-CM | POA: Diagnosis not present

## 2017-10-05 DIAGNOSIS — R262 Difficulty in walking, not elsewhere classified: Secondary | ICD-10-CM | POA: Diagnosis not present

## 2017-10-05 NOTE — Telephone Encounter (Signed)
Spoke with Steffanie Dunn at [facility] notifying her Dr. Darnell Level is ok with manufacturer change.  Says she will make note and fill rx.

## 2017-10-05 NOTE — Telephone Encounter (Signed)
Received faxed Need for Clarification from [place] asking to change manufacturer for levothyroxine 150 mcg.  Placed in Dr. Synthia Innocent box.

## 2017-10-05 NOTE — Telephone Encounter (Signed)
Attempted to contact pt. No answer. Vm is full.  Need to inform him, per [place], there will be a manufacturer change with the levothyroxine.  So Dr. Darnell Level wants pt to call and schedule a lab visit when pt starts the new rx.

## 2017-10-06 NOTE — Telephone Encounter (Signed)
Attempted to contact pt. No answer. VM box is full.  Need to inform pt due to manufacturer change, he will receive another levothyroxine rx.  After 6 wks of taking new rx, he needs to schedule thyroid lab visit.

## 2017-10-06 NOTE — Telephone Encounter (Signed)
Ok to change - pt needs to come in 6 wks after taking new formulary for thyroid check. Labs ordered.  See refill request from 10/01/2017 - this was already addressed. Pt needs to be contacted

## 2017-10-07 DIAGNOSIS — Z471 Aftercare following joint replacement surgery: Secondary | ICD-10-CM | POA: Diagnosis not present

## 2017-10-07 DIAGNOSIS — M25562 Pain in left knee: Secondary | ICD-10-CM | POA: Diagnosis not present

## 2017-10-07 DIAGNOSIS — M1712 Unilateral primary osteoarthritis, left knee: Secondary | ICD-10-CM | POA: Diagnosis not present

## 2017-10-07 NOTE — Telephone Encounter (Signed)
Spoke with pt. See Refill encounter, 10/01/17.

## 2017-10-07 NOTE — Telephone Encounter (Signed)
Spoke with pt informing him of change in manufacturer for thyroid med.  Pt verbalized understanding and stated he started new rx 10/04/17.  Informed him Dr. Darnell Level wanted to have labs 6 wks after starting the new rx.  Pt is scheduled for Fri, 11/12/17 @ 9:00 AM.

## 2017-10-11 ENCOUNTER — Telehealth: Payer: Self-pay | Admitting: *Deleted

## 2017-10-11 NOTE — Telephone Encounter (Signed)
Having left leg numbness from knee down.  Please call asap  Call [phone] if no answer at other number.  Okay to leave detailed message at either number.

## 2017-10-11 NOTE — Telephone Encounter (Signed)
Pt stopped back in office. [place] desk spoke with pt. I advised they could offer 10/18/17 at 11:00am. They found cx on Thursday 10/14/17 and scheduled pt.

## 2017-10-11 NOTE — Telephone Encounter (Signed)
I called the patient.  The patient indicates a 53-month history of gradually worsening numbness of the left leg, currently going up to the hip.  He does have a history of cerebrovascular disease, but this problem has been coming on very slowly.  He had a knee surgery recently, since the surgery the numbness has worsened.  We will get the patient set up for a work in revisit to evaluate this issue.

## 2017-10-12 ENCOUNTER — Ambulatory Visit (HOSPITAL_COMMUNITY)
Admission: RE | Admit: 2017-10-12 | Discharge: 2017-10-12 | Disposition: A | Discharge status: Home / Self Care | Payer: 59 | Source: Ambulatory Visit | Attending: Cardiovascular Disease | Admitting: Cardiovascular Disease

## 2017-10-12 ENCOUNTER — Other Ambulatory Visit (HOSPITAL_COMMUNITY): Payer: Self-pay | Admitting: Orthopedic Surgery

## 2017-10-12 DIAGNOSIS — M79605 Pain in left leg: Secondary | ICD-10-CM | POA: Insufficient documentation

## 2017-10-12 DIAGNOSIS — M25562 Pain in left knee: Secondary | ICD-10-CM | POA: Diagnosis not present

## 2017-10-12 DIAGNOSIS — M1712 Unilateral primary osteoarthritis, left knee: Secondary | ICD-10-CM | POA: Diagnosis not present

## 2017-10-12 DIAGNOSIS — Z471 Aftercare following joint replacement surgery: Secondary | ICD-10-CM | POA: Diagnosis not present

## 2017-10-14 ENCOUNTER — Encounter: Payer: Self-pay | Admitting: Neurology

## 2017-10-14 ENCOUNTER — Ambulatory Visit: Payer: 59 | Admitting: Neurology

## 2017-10-14 VITALS — BP 153/94 | HR 102 | Wt 206.5 lb

## 2017-10-14 DIAGNOSIS — Z471 Aftercare following joint replacement surgery: Secondary | ICD-10-CM | POA: Diagnosis not present

## 2017-10-14 DIAGNOSIS — R200 Anesthesia of skin: Secondary | ICD-10-CM | POA: Diagnosis not present

## 2017-10-14 DIAGNOSIS — M1712 Unilateral primary osteoarthritis, left knee: Secondary | ICD-10-CM | POA: Diagnosis not present

## 2017-10-14 DIAGNOSIS — M25562 Pain in left knee: Secondary | ICD-10-CM | POA: Diagnosis not present

## 2017-10-14 NOTE — Progress Notes (Signed)
Reason for visit: Left leg numbness  Patrick Bailey. is an 57 y.o. male  History of present illness:  Patrick Bailey is a 57 year old right-handed white male with a history of gait ataxia felt secondary to a small brainstem stroke in the past.  The patient just recently had surgery on the left knee on 24 August 2017.  The patient had an adductor canal block for regional anesthesia.  The patient indicates that prior to surgery had some slight numbness of the left foot, he did not notice any numbness of the leg until well after surgery.  He just happened to notice that the sensations of temperature were different when he took a shower.  His right leg felt that the water was hot, left leg could not feel this.  The entire leg is altered in sensation.  He has not improved over time, he is 7 weeks out of surgery now.  The patient does not note any weakness of the left leg, he feels that his balance has worsened over the last 2 weeks.  He does have some slight low back pain without pain radiating down the legs on either side.  He reports no troubles controlling the bowels or the bladder.  He has no numbness on the body or face or arms.  He comes to this office for an evaluation.  Past Medical History:  Diagnosis Date  . Abnormality of gait 07/17/2016  . Alcohol use   . Arthritis   . Cerebellar stroke ([place]) 07/17/2016   Presumed, causing gait abnormality s/p eval by neuro [name]) 07/2016 overall improving.   Marland Kitchen CPDD (calcium pyrophosphate deposition disease) 10/2013   R hand xray, L knee xray  . GERD (gastroesophageal reflux disease)   . Gout   . History of smoking   . HTN (hypertension)   . Hypertension   . Hypothyroidism (acquired)   . Insomnia   . Throat cancer ([place])    Dr. Caryl Pina [facility])  . TOBACCO ABUSE, HX OF 02/05/2009   Quit 2011      Past Surgical History:  Procedure Laterality Date  . HAND SURGERY    . HARDWARE REMOVAL Left 08/24/2017   Procedure: LEFT KNEE HARDWARE REMOVAL;   Surgeon: Renette Butters, MD;  Location: [place];  Service: Orthopedics;  Laterality: Left;  Marland Kitchen MASS EXCISION Right 03/19/2016   EXCISION LIPOMA RIGHT WRIST;  Surgeon: Leanora Cover, MD  . NECK SURGERY  2010   oral cancer excision - [place] ([facility])  . SKIN LESION EXCISION  08/2010   Forehead pyogenic granuloma [name]  . THROAT SURGERY     "cut out some of my tongue and throat" d/t throat cancer  . TOTAL KNEE ARTHROPLASTY Left 08/24/2017   Procedure: LEFT TOTAL KNEE ARTHROPLASTY;  Surgeon: Renette Butters, MD;  Location: [place];  Service: Orthopedics;  Laterality: Left;    Family History  Problem Relation Age of Onset  . Coronary artery disease Father        MI  . Hypertension Father   . Stroke Father   . Cancer Mother   . Cancer Brother        Brain    Social history:  reports that he quit smoking about 9 years ago. His smoking use included cigarettes. He has a 7.50 pack-year smoking history. he has never used smokeless tobacco. He reports that he drinks about 1.8 oz of alcohol per week. He reports that he does not use drugs.   No Known  Allergies  Medications:  Prior to Admission medications   Medication Sig Start Date End Date Taking? Authorizing Provider  amitriptyline (ELAVIL) 50 MG tablet TAKE ONE TABLET BY MOUTH AT BEDTIME 01/15/17  Yes Ria Bush, MD  amLODipine (NORVASC) 10 MG tablet TAKE 1 TABLET BY MOUTH ONCE DAILY 08/17/17  Yes Ria Bush, MD  aspirin EC 325 MG tablet Take 1 tablet (325 mg total) by mouth daily. For 30 days post op for DVT Prophylaxis 08/24/17  Yes Prudencio Burly III, PA-C  atorvastatin (LIPITOR) 40 MG tablet Take 1 tablet (40 mg total) by mouth daily. 12/12/16  Yes Ria Bush, MD  DENTAGEL 1.1 % GEL dental gel Place 1 application onto teeth at bedtime.  09/22/16  Yes [provider]  fenofibrate 160 MG tablet Take 1 tablet (160 mg total) by mouth daily. 12/12/16  Yes Ria Bush, MD  fish oil-omega-3 fatty acids  1000 MG capsule Take 1 g by mouth daily.     Yes [provider]  Ibuprofen-Diphenhydramine Cit (ADVIL PM PO) Take 1 tablet by mouth at bedtime as needed (SLEEP).   Yes [provider]  levothyroxine (SYNTHROID, LEVOTHROID) 150 MCG tablet Take 1 tablet (150 mcg total) by mouth daily. 10/01/17  Yes Ria Bush, MD  metoprolol tartrate (LOPRESSOR) 100 MG tablet TAKE 1 TABLET BY MOUTH TWICE DAILY 09/21/17  Yes Ria Bush, MD  MITIGARE 0.6 MG CAPS TAKE 1 CAPSULE BY MOUTH IN THE MORNING AS NEEDED Patient taking differently: TAKE 1 CAPSULE BY MOUTH IN THE MORNING EVERY OTHER DAY 01/15/17  Yes Ria Bush, MD  omeprazole (PRILOSEC) 20 MG capsule Take 20 mg by mouth daily.   Yes [provider]    ROS:  Out of a complete 14 system review of symptoms, the patient complains only of the following symptoms, and all other reviewed systems are negative.  Leg numbness  Blood pressure (!) 153/94, pulse (!) 102, weight 206 lb 8 oz (93.7 kg).  Physical Exam  General: The patient is alert and cooperative at the time of the examination.  Skin: No significant peripheral edema is noted.  There is some swelling around the left knee.   Neurologic Exam  Mental status: The patient is alert and oriented x 3 at the time of the examination. The patient has apparent normal recent and remote memory, with an apparently normal attention span and concentration ability.   Cranial nerves: Facial symmetry is present. Speech is normal, no aphasia or dysarthria is noted. Extraocular movements are full. Visual fields are full.  Motor: The patient has good strength in all 4 extremities.  Sensory examination: Soft touch sensation is symmetric on the face and arms, decreased pinprick sensation and vibration sensation on the left leg as compared to the right.  Coordination: The patient has good finger-nose-finger and heel-to-shin bilaterally.  Gait and station: The patient has a  normal gait. Tandem gait is normal. Romberg is negative. No drift is seen.  Reflexes: Deep tendon reflexes are symmetric.   Assessment/Plan:  1.  Left leg numbness, etiology unclear  2.  History of cerebrovascular disease  Given the patient's history of a prior brainstem stroke, the patient will undergo MRI of the brain to exclude the possibility of a recurring stroke event.  The patient reports painless numbness of the entirety of the left leg unassociated with any weakness.  This dichotomy would go against a neuropathy issue causing his leg numbness.  I will contact the patient once the MRI of the brain  has been done.  Jill Alexanders MD 10/14/2017 2:28 PM  [facility] [address] [place], [place] [address]  Phone [phone] Fax [phone]

## 2017-10-14 NOTE — Patient Instructions (Signed)
    We will check MRI of the brain. 

## 2017-10-15 ENCOUNTER — Telehealth: Payer: Self-pay | Admitting: Neurology

## 2017-10-15 ENCOUNTER — Ambulatory Visit (AMBULATORY_SURGERY_CENTER): Payer: Self-pay | Admitting: *Deleted

## 2017-10-15 ENCOUNTER — Other Ambulatory Visit: Payer: Self-pay

## 2017-10-15 VITALS — Ht 72.0 in | Wt 207.0 lb

## 2017-10-15 DIAGNOSIS — Z1211 Encounter for screening for malignant neoplasm of colon: Secondary | ICD-10-CM

## 2017-10-15 MED ORDER — NA SULFATE-K SULFATE-MG SULF 17.5-3.13-1.6 GM/177ML PO SOLN
ORAL | 0 refills | Status: DC
Start: 2017-10-15 — End: 2018-01-28

## 2017-10-15 NOTE — Progress Notes (Signed)
Patient denies any allergies to eggs or soy. Patient denies any problems with anesthesia/sedation. Patient denies any oxygen use at home. Patient denies taking any diet/weight loss medications or blood thinners. EMMI education declined by pt, no email no computer use. Suprep coupon given to pt.

## 2017-10-15 NOTE — Telephone Encounter (Signed)
[employer] Auth: [id number] (exp. 10/14/17 to 11/28/17)  10/15/17 i try calling cell # the voicemail box hasn't been set up i LVM on home phone for pt to call back about scheduling mri

## 2017-10-19 DIAGNOSIS — M25562 Pain in left knee: Secondary | ICD-10-CM | POA: Diagnosis not present

## 2017-10-19 DIAGNOSIS — Z471 Aftercare following joint replacement surgery: Secondary | ICD-10-CM | POA: Diagnosis not present

## 2017-10-19 DIAGNOSIS — M1712 Unilateral primary osteoarthritis, left knee: Secondary | ICD-10-CM | POA: Diagnosis not present

## 2017-10-20 ENCOUNTER — Ambulatory Visit (INDEPENDENT_AMBULATORY_CARE_PROVIDER_SITE_OTHER): Payer: 59

## 2017-10-20 DIAGNOSIS — R200 Anesthesia of skin: Secondary | ICD-10-CM | POA: Diagnosis not present

## 2017-10-21 ENCOUNTER — Telehealth: Payer: Self-pay | Admitting: Neurology

## 2017-10-21 DIAGNOSIS — R200 Anesthesia of skin: Secondary | ICD-10-CM

## 2017-10-21 NOTE — Telephone Encounter (Signed)
I called the patient.  The MRI of the brain is unremarkable, no evidence of a stroke that would cause the left leg numbness.  I will go ahead and get EMG and nerve conduction study evaluation to look at the leg.  We will do nerve conduction studies on both legs, EMG on the left leg.   MRI brain 10/21/17:  IMPRESSION:   Abnormal MRI brain (without) demonstrating: - Mild periventricular and subcortical chronic small vessel ischemic disease. - Right vertebral artery has abnormal signal and may represent occlusion or slow flow. - No change from MRI on 07/11/16.

## 2017-10-23 ENCOUNTER — Other Ambulatory Visit: Payer: Self-pay | Admitting: Family Medicine

## 2017-10-25 NOTE — Telephone Encounter (Signed)
Electronic refill request Last refill 01/15/17 #90/2 Last office visit 08/13/17/acute

## 2017-10-29 ENCOUNTER — Encounter: Payer: 59 | Admitting: Gastroenterology

## 2017-10-29 ENCOUNTER — Telehealth: Payer: Self-pay | Admitting: Gastroenterology

## 2017-10-29 NOTE — Telephone Encounter (Signed)
ok 

## 2017-10-29 NOTE — Telephone Encounter (Signed)
Hi Dr. Silverio Decamp, this pt had a colon scheduled today at 2:30pm but had to cancel because he is sick. He has rescheduled to 5/17. Thank you.

## 2017-11-12 ENCOUNTER — Other Ambulatory Visit (INDEPENDENT_AMBULATORY_CARE_PROVIDER_SITE_OTHER): Payer: 59

## 2017-11-12 DIAGNOSIS — E039 Hypothyroidism, unspecified: Secondary | ICD-10-CM

## 2017-11-12 LAB — T4, FREE: Free T4: 0.91 ng/dL (ref 0.60–1.60)

## 2017-11-12 LAB — TSH: TSH: 1.49 u[IU]/mL (ref 0.35–4.50)

## 2017-11-18 NOTE — Telephone Encounter (Signed)
FYI

## 2017-11-23 ENCOUNTER — Encounter: Payer: 59 | Admitting: Neurology

## 2017-11-29 ENCOUNTER — Other Ambulatory Visit: Payer: Self-pay

## 2017-11-29 MED ORDER — FENOFIBRATE 160 MG PO TABS: 160.0000 mg | ORAL_TABLET | Freq: Every day | ORAL | 0 refills | Status: DC

## 2017-11-29 NOTE — Telephone Encounter (Signed)
Sent refill

## 2017-12-03 ENCOUNTER — Other Ambulatory Visit: Payer: Self-pay

## 2017-12-03 DIAGNOSIS — M1712 Unilateral primary osteoarthritis, left knee: Secondary | ICD-10-CM | POA: Diagnosis not present

## 2017-12-03 DIAGNOSIS — M25562 Pain in left knee: Secondary | ICD-10-CM | POA: Diagnosis not present

## 2017-12-03 MED ORDER — ATORVASTATIN CALCIUM 40 MG PO TABS: 40.0000 mg | ORAL_TABLET | Freq: Every day | ORAL | 0 refills | Status: DC

## 2017-12-03 NOTE — Telephone Encounter (Signed)
Sent refill

## 2017-12-15 ENCOUNTER — Other Ambulatory Visit: Payer: Self-pay | Admitting: Family Medicine

## 2017-12-17 ENCOUNTER — Encounter: Payer: 59 | Admitting: Gastroenterology

## 2017-12-22 ENCOUNTER — Encounter: Payer: 59 | Admitting: Neurology

## 2017-12-24 DIAGNOSIS — M1712 Unilateral primary osteoarthritis, left knee: Secondary | ICD-10-CM | POA: Diagnosis not present

## 2017-12-30 ENCOUNTER — Other Ambulatory Visit: Payer: Self-pay | Admitting: Family Medicine

## 2017-12-31 ENCOUNTER — Encounter: Payer: 59 | Admitting: Gastroenterology

## 2018-01-10 ENCOUNTER — Encounter: Payer: 59 | Admitting: Neurology

## 2018-01-14 ENCOUNTER — Ambulatory Visit
Admit: 2018-01-14 | Discharge: 2018-01-15 | Payer: PRIVATE HEALTH INSURANCE | Attending: General Practice | Primary: General Practice

## 2018-01-14 DIAGNOSIS — Z0120 Encounter for dental examination and cleaning without abnormal findings: Principal | ICD-10-CM

## 2018-01-28 ENCOUNTER — Ambulatory Visit (INDEPENDENT_AMBULATORY_CARE_PROVIDER_SITE_OTHER): Payer: 59 | Admitting: Family Medicine

## 2018-01-28 ENCOUNTER — Encounter: Payer: Self-pay | Admitting: Family Medicine

## 2018-01-28 VITALS — BP 136/86 | HR 76 | Temp 97.8°F | Ht 70.5 in | Wt 202.2 lb

## 2018-01-28 DIAGNOSIS — E039 Hypothyroidism, unspecified: Secondary | ICD-10-CM | POA: Diagnosis not present

## 2018-01-28 DIAGNOSIS — E781 Pure hyperglyceridemia: Secondary | ICD-10-CM

## 2018-01-28 DIAGNOSIS — F5101 Primary insomnia: Secondary | ICD-10-CM

## 2018-01-28 DIAGNOSIS — I10 Essential (primary) hypertension: Secondary | ICD-10-CM | POA: Diagnosis not present

## 2018-01-28 DIAGNOSIS — I639 Cerebral infarction, unspecified: Secondary | ICD-10-CM

## 2018-01-28 DIAGNOSIS — Z0000 Encounter for general adult medical examination without abnormal findings: Secondary | ICD-10-CM

## 2018-01-28 DIAGNOSIS — Z7289 Other problems related to lifestyle: Secondary | ICD-10-CM

## 2018-01-28 DIAGNOSIS — Z125 Encounter for screening for malignant neoplasm of prostate: Secondary | ICD-10-CM

## 2018-01-28 DIAGNOSIS — Z789 Other specified health status: Secondary | ICD-10-CM | POA: Diagnosis not present

## 2018-01-28 DIAGNOSIS — M1712 Unilateral primary osteoarthritis, left knee: Secondary | ICD-10-CM | POA: Diagnosis not present

## 2018-01-28 MED ORDER — METOPROLOL TARTRATE 100 MG PO TABS: 100.0000 mg | ORAL_TABLET | Freq: Two times a day (BID) | ORAL | 3 refills | Status: DC

## 2018-01-28 MED ORDER — LEVOTHYROXINE SODIUM 150 MCG PO TABS: 150.0000 ug | ORAL_TABLET | Freq: Every day | ORAL | 3 refills | Status: DC

## 2018-01-28 MED ORDER — ATORVASTATIN CALCIUM 40 MG PO TABS: 40.0000 mg | ORAL_TABLET | Freq: Every day | ORAL | 3 refills | Status: DC

## 2018-01-28 MED ORDER — AMITRIPTYLINE HCL 75 MG PO TABS: 75.0000 mg | ORAL_TABLET | Freq: Every day | ORAL | 3 refills | Status: DC

## 2018-01-28 MED ORDER — FENOFIBRATE 160 MG PO TABS
160.0000 mg | ORAL_TABLET | Freq: Every day | ORAL | 3 refills | Status: DC
Start: 2018-01-28 — End: 2021-03-17

## 2018-01-28 NOTE — Assessment & Plan Note (Signed)
Ongoing pain after surgery. Followed by ortho.

## 2018-01-28 NOTE — Progress Notes (Signed)
BP 136/86 (BP Location: Left Arm, Patient Position: Sitting, Cuff Size: Normal)   Pulse 76   Temp 97.8 F (36.6 C) (Oral)   Ht 5' 10.5" (1.791 m)   Wt 202 lb 4 oz (91.7 kg)   SpO2 97%   BMI 28.61 kg/m    CC: CPE Subjective:    Patient ID: Patrick Bailey., male    DOB: 1961/01/09, 57 y.o.   MRN: [id number]  HPI: Patrick Bailey. is a 57 y.o. male presenting on 01/28/2018 for Annual Exam (Wants to discuss increasing amitriptyline.)   H/o throat cancer s/p surgery and radiation therapy 2010  Cerebellar CVA 07/2016 affected gait, with mild residual imbalance. Has seen Dr [name] neurology.   Poor results from L knee surgery 09/2017.  Pseudogout of L knee by imaging - on colchicine daily.   Ongoing sleep maintenance insomnia - requests increase in amitriptyline.   Preventative: Colonoscopy pending next month.  Prostate cancer screening - discussed, no fmhx prostate cancer, declines.  Flu shot - declines Tdap 05/2014 Seat belt use discussed  Sunscreen use discussed. No changing moles on skin. Ex smoker - quit 2010. Around other smokers.  Alcohol - down 3-4 beers on weekends  Dentist - regularly - some tooth grinding Eye exam - not recently  Lives alone - split from wife. 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 Interim medical history since our last visit reviewed. Allergies and medications reviewed and updated. Outpatient Medications Prior to Visit  Medication Sig Dispense Refill  . amLODipine (NORVASC) 10 MG tablet TAKE 1 TABLET BY MOUTH ONCE DAILY 90 tablet 3  . aspirin EC 81 MG tablet Take 81 mg by mouth daily.    . DENTAGEL 1.1 % GEL dental gel Place 1 application onto teeth at bedtime.     . fish oil-omega-3 fatty acids 1000 MG capsule Take 1 g by mouth daily.      .  Ibuprofen-Diphenhydramine Cit (ADVIL PM PO) Take 1 tablet by mouth at bedtime as needed (SLEEP).    Marland Kitchen MITIGARE 0.6 MG CAPS TAKE 1 CAPSULE BY MOUTH IN THE MORNING AS NEEDED 90 capsule 0  . omeprazole (PRILOSEC) 20 MG capsule Take 20 mg by mouth daily.    Marland Kitchen amitriptyline (ELAVIL) 50 MG tablet TAKE 1 TABLET BY MOUTH AT BEDTIME 90 tablet 2  . atorvastatin (LIPITOR) 40 MG tablet Take 1 tablet (40 mg total) by mouth daily. 90 tablet 0  . fenofibrate 160 MG tablet Take 1 tablet (160 mg total) by mouth daily. 90 tablet 0  . levothyroxine (SYNTHROID, LEVOTHROID) 150 MCG tablet Take 1 tablet (150 mcg total) by mouth daily. 90 tablet 0  . metoprolol tartrate (LOPRESSOR) 100 MG tablet TAKE 1 TABLET BY MOUTH TWICE DAILY 60 tablet 1  . Na Sulfate-K Sulfate-Mg Sulf 17.5-3.13-1.6 GM/177ML SOLN Suprep (no substitutions)-TAKE AS DIRECTED. 354 mL 0   No facility-administered medications prior to visit.      Per HPI unless specifically indicated in ROS section below Review of Systems  Constitutional: Negative for activity change, appetite change, chills, fatigue, fever and unexpected weight change.  HENT: Negative for hearing loss.   Eyes: Negative for visual disturbance.  Respiratory: Negative for cough, chest tightness, shortness of breath and wheezing.   Cardiovascular: Negative for chest pain, palpitations and leg swelling.  Gastrointestinal: Negative for  abdominal distention, abdominal pain, blood in stool, constipation, diarrhea, nausea and vomiting.  Genitourinary: Negative for difficulty urinating and hematuria.  Musculoskeletal: Negative for arthralgias, myalgias and neck pain.  Skin: Negative for rash.  Neurological: Negative for dizziness, seizures, syncope and headaches.  Hematological: Negative for adenopathy. Does not bruise/bleed easily.  Psychiatric/Behavioral: Negative for dysphoric mood. The patient is not nervous/anxious.        Objective:    BP 136/86 (BP Location: Left Arm, Patient  Position: Sitting, Cuff Size: Normal)   Pulse 76   Temp 97.8 F (36.6 C) (Oral)   Ht 5' 10.5" (1.791 m)   Wt 202 lb 4 oz (91.7 kg)   SpO2 97%   BMI 28.61 kg/m   Wt Readings from Last 3 Encounters:  01/28/18 202 lb 4 oz (91.7 kg)  10/15/17 207 lb (93.9 kg)  10/14/17 206 lb 8 oz (93.7 kg)    Physical Exam  Constitutional: He is oriented to person, place, and time. He appears well-developed and well-nourished. No distress.  HENT:  Head: Normocephalic and atraumatic.  Right Ear: Hearing, tympanic membrane, external ear and ear canal normal.  Left Ear: Hearing, tympanic membrane, external ear and ear canal normal.  Nose: Nose normal.  Mouth/Throat: Uvula is midline, oropharynx is clear and moist and mucous membranes are normal. No oropharyngeal exudate, posterior oropharyngeal edema or posterior oropharyngeal erythema.  Eyes: Pupils are equal, round, and reactive to light. Conjunctivae and EOM are normal. No scleral icterus.  Neck: Normal range of motion. No thyromegaly present.  Woody consistency of R neck  Cardiovascular: Normal rate, regular rhythm, normal heart sounds and intact distal pulses.  No murmur heard. Pulses:      Radial pulses are 2+ on the right side, and 2+ on the left side.  Pulmonary/Chest: Effort normal and breath sounds normal. No respiratory distress. He has no wheezes. He has no rales.  Abdominal: Soft. Bowel sounds are normal. He exhibits no distension and no mass. There is no tenderness. There is no rebound and no guarding.  Musculoskeletal: Normal range of motion. He exhibits no edema.  Lymphadenopathy:    He has no cervical adenopathy.  Neurological: He is alert and oriented to person, place, and time.  CN grossly intact, station and gait intact  Skin: Skin is warm and dry. No rash noted.  Psychiatric: He has a normal mood and affect. His behavior is normal. Judgment and thought content normal.  Nursing note and vitals reviewed.  Results for orders placed  or performed in visit on 11/12/17  T4, free  Result Value Ref Range   Free T4 0.91 0.60 - 1.60 ng/dL  TSH  Result Value Ref Range   TSH 1.49 0.35 - 4.50 uIU/mL      Assessment & Plan:   Problem List Items Addressed This Visit    Primary localized osteoarthritis of left knee    Ongoing pain after surgery. Followed by ortho.       Insomnia    Sleep maintenance insomnia. Will increase amitriptyline to 75mg  nightly.      Hypothyroidism    Chronic, stable readings after recent formulary change. Continue levothyroxine 158mcg daily.       Relevant Medications   metoprolol tartrate (LOPRESSOR) 100 MG tablet   levothyroxine (SYNTHROID, LEVOTHROID) 150 MCG tablet   Hypertriglyceridemia    Chronic. Continue fibrate, lipitor, fish oil. Update FLP next week when he returns fasting The ASCVD Risk score Mikey Bussing DC Brooke Bonito., et al., 2013) failed to calculate for  the following reasons:   The patient has a prior MI or stroke diagnosis       Relevant Medications   atorvastatin (LIPITOR) 40 MG tablet   fenofibrate 160 MG tablet   metoprolol tartrate (LOPRESSOR) 100 MG tablet   Other Relevant Orders   Comprehensive metabolic panel   Lipid panel   Health maintenance examination - Primary    Preventative protocols reviewed and updated unless pt declined. Discussed healthy diet and lifestyle.       Essential hypertension    Chronic, stable. Continue current regimen       Relevant Medications   atorvastatin (LIPITOR) 40 MG tablet   fenofibrate 160 MG tablet   metoprolol tartrate (LOPRESSOR) 100 MG tablet   Cerebellar stroke ([place])    Presumed 07/2016. On aspirin, statin, continue tight blood pressure control.      Relevant Medications   atorvastatin (LIPITOR) 40 MG tablet   fenofibrate 160 MG tablet   metoprolol tartrate (LOPRESSOR) 100 MG tablet   Alcohol use ([facility])    Continues cutting down - states only on weekends.       Relevant Orders   CBC with Differential/Platelet    Other  Visit Diagnoses    Special screening for malignant neoplasm of prostate       Relevant Orders   PSA       Meds ordered this encounter  Medications  . atorvastatin (LIPITOR) 40 MG tablet    Sig: Take 1 tablet (40 mg total) by mouth daily.    Dispense:  90 tablet    Refill:  3  . fenofibrate 160 MG tablet    Sig: Take 1 tablet (160 mg total) by mouth daily.    Dispense:  90 tablet    Refill:  3  . metoprolol tartrate (LOPRESSOR) 100 MG tablet    Sig: Take 1 tablet (100 mg total) by mouth 2 (two) times daily.    Dispense:  180 tablet    Refill:  3  . levothyroxine (SYNTHROID, LEVOTHROID) 150 MCG tablet    Sig: Take 1 tablet (150 mcg total) by mouth daily.    Dispense:  90 tablet    Refill:  3  . amitriptyline (ELAVIL) 75 MG tablet    Sig: Take 1 tablet (75 mg total) by mouth at bedtime.    Dispense:  90 tablet    Refill:  3   Orders Placed This Encounter  Procedures  . Comprehensive metabolic panel    Standing Status:   Future    Standing Expiration Date:   01/29/2019  . Lipid panel    Standing Status:   Future    Standing Expiration Date:   01/29/2019  . PSA    Standing Status:   Future    Standing Expiration Date:   01/29/2019  . CBC with Differential/Platelet    Standing Status:   Future    Standing Expiration Date:   01/29/2019    Follow up plan: Return in about 1 year (around 01/29/2019) for annual exam, prior fasting for blood work.  Ria Bush, MD

## 2018-01-28 NOTE — Assessment & Plan Note (Addendum)
Chronic. Continue fibrate, lipitor, fish oil. Update FLP next week when he returns fasting The ASCVD Risk score Mikey Bussing DC Jr., et al., 2013) failed to calculate for the following reasons:   The patient has a prior MI or stroke diagnosis

## 2018-01-28 NOTE — Assessment & Plan Note (Signed)
Chronic, stable. Continue current regimen. 

## 2018-01-28 NOTE — Assessment & Plan Note (Signed)
Chronic, stable readings after recent formulary change. Continue levothyroxine 152mcg daily.

## 2018-01-28 NOTE — Assessment & Plan Note (Signed)
Preventative protocols reviewed and updated unless pt declined. Discussed healthy diet and lifestyle.  

## 2018-01-28 NOTE — Assessment & Plan Note (Signed)
Sleep maintenance insomnia. Will increase amitriptyline to 75mg  nightly.

## 2018-01-28 NOTE — Assessment & Plan Note (Addendum)
Presumed 07/2016. On aspirin, statin, continue tight blood pressure control.

## 2018-01-28 NOTE — Patient Instructions (Addendum)
Return next Friday for fasting labs - let them know up front. You are doing well today. Continue current medicines. I have increased amitriptyline to 75mg  nightly Return as needed or in 1 year for next physical  Health Maintenance, Male A healthy lifestyle and preventive care is important for your health and wellness. Ask your health care provider about what schedule of regular examinations is right for you. What should I know about weight and diet? Eat a Healthy Diet  Eat plenty of vegetables, fruits, whole grains, low-fat dairy products, and lean protein.  Do not eat a lot of foods high in solid fats, added sugars, or salt.  Maintain a Healthy Weight Regular exercise can help you achieve or maintain a healthy weight. You should:  Do at least 150 minutes of exercise each week. The exercise should increase your heart rate and make you sweat (moderate-intensity exercise).  Do strength-training exercises at least twice a week.  Watch Your Levels of Cholesterol and Blood Lipids  Have your blood tested for lipids and cholesterol every 5 years starting at 58 years of age. If you are at high risk for heart disease, you should start having your blood tested when you are 58 years old. You may need to have your cholesterol levels checked more often if: ? Your lipid or cholesterol levels are high. ? You are older than 58 years of age. ? You are at high risk for heart disease.  What should I know about cancer screening? Many types of cancers can be detected early and may often be prevented. Lung Cancer  You should be screened every year for lung cancer if: ? You are a current smoker who has smoked for at least 30 years. ? You are a former smoker who has quit within the past 15 years.  Talk to your health care provider about your screening options, when you should start screening, and how often you should be screened.  Colorectal Cancer  Routine colorectal cancer screening usually begins  at 58 years of age and should be repeated every 5-10 years until you are 58 years old. You may need to be screened more often if early forms of precancerous polyps or small growths are found. Your health care provider may recommend screening at an earlier age if you have risk factors for colon cancer.  Your health care provider may recommend using home test kits to check for hidden blood in the stool.  A small camera at the end of a tube can be used to examine your colon (sigmoidoscopy or colonoscopy). This checks for the earliest forms of colorectal cancer.  Prostate and Testicular Cancer  Depending on your age and overall health, your health care provider may do certain tests to screen for prostate and testicular cancer.  Talk to your health care provider about any symptoms or concerns you have about testicular or prostate cancer.  Skin Cancer  Check your skin from head to toe regularly.  Tell your health care provider about any new moles or changes in moles, especially if: ? There is a change in a mole's size, shape, or color. ? You have a mole that is larger than a pencil eraser.  Always use sunscreen. Apply sunscreen liberally and repeat throughout the day.  Protect yourself by wearing long sleeves, pants, a wide-brimmed hat, and sunglasses when outside.  What should I know about heart disease, diabetes, and high blood pressure?  If you are 43-73 years of age, have your blood pressure checked  every 3-5 years. If you are 47 years of age or older, have your blood pressure checked every year. You should have your blood pressure measured twice-once when you are at a hospital or clinic, and once when you are not at a hospital or clinic. Record the average of the two measurements. To check your blood pressure when you are not at a hospital or clinic, you can use: ? An automated blood pressure machine at a pharmacy. ? A home blood pressure monitor.  Talk to your health care provider about  your target blood pressure.  If you are between 30-1 years old, ask your health care provider if you should take aspirin to prevent heart disease.  Have regular diabetes screenings by checking your fasting blood sugar level. ? If you are at a normal weight and have a low risk for diabetes, have this test once every three years after the age of 66. ? If you are overweight and have a high risk for diabetes, consider being tested at a younger age or more often.  A one-time screening for abdominal aortic aneurysm (AAA) by ultrasound is recommended for men aged 2-75 years who are current or former smokers. What should I know about preventing infection? Hepatitis B If you have a higher risk for hepatitis B, you should be screened for this virus. Talk with your health care provider to find out if you are at risk for hepatitis B infection. Hepatitis C Blood testing is recommended for:  Everyone born from [birth year] through 1966.  Anyone with known risk factors for hepatitis C.  Sexually Transmitted Diseases (STDs)  You should be screened each year for STDs including gonorrhea and chlamydia if: ? You are sexually active and are younger than 58 years of age. ? You are older than 58 years of age and your health care provider tells you that you are at risk for this type of infection. ? Your sexual activity has changed since you were last screened and you are at an increased risk for chlamydia or gonorrhea. Ask your health care provider if you are at risk.  Talk with your health care provider about whether you are at high risk of being infected with HIV. Your health care provider may recommend a prescription medicine to help prevent HIV infection.  What else can I do?  Schedule regular health, dental, and eye exams.  Stay current with your vaccines (immunizations).  Do not use any tobacco products, such as cigarettes, chewing tobacco, and e-cigarettes. If you need help quitting, ask your health care  provider.  Limit alcohol intake to no more than 2 drinks per day. One drink equals 12 ounces of beer, 5 ounces of wine, or 1 ounces of hard liquor.  Do not use street drugs.  Do not share needles.  Ask your health care provider for help if you need support or information about quitting drugs.  Tell your health care provider if you often feel depressed.  Tell your health care provider if you have ever been abused or do not feel safe at home. This information is not intended to replace advice given to you by your health care provider. Make sure you discuss any questions you have with your health care provider. Document Released: 01/16/2008 Document Revised: 03/18/2016 Document Reviewed: 04/23/2015 Elsevier Interactive Patient Education  [employer].

## 2018-01-28 NOTE — Assessment & Plan Note (Signed)
Continues cutting down - states only on weekends.

## 2018-02-04 ENCOUNTER — Other Ambulatory Visit (INDEPENDENT_AMBULATORY_CARE_PROVIDER_SITE_OTHER): Payer: 59

## 2018-02-04 DIAGNOSIS — Z125 Encounter for screening for malignant neoplasm of prostate: Secondary | ICD-10-CM

## 2018-02-04 DIAGNOSIS — Z789 Other specified health status: Secondary | ICD-10-CM | POA: Diagnosis not present

## 2018-02-04 DIAGNOSIS — E781 Pure hyperglyceridemia: Secondary | ICD-10-CM | POA: Diagnosis not present

## 2018-02-04 DIAGNOSIS — Z7289 Other problems related to lifestyle: Secondary | ICD-10-CM

## 2018-02-04 LAB — COMPREHENSIVE METABOLIC PANEL
ALBUMIN: 4.5 g/dL (ref 3.5–5.2)
ALK PHOS: 58 U/L (ref 39–117)
ALT: 30 U/L (ref 0–53)
AST: 23 U/L (ref 0–37)
BILIRUBIN TOTAL: 0.5 mg/dL (ref 0.2–1.2)
BUN: 18 mg/dL (ref 6–23)
CO2: 29 mEq/L (ref 19–32)
CREATININE: 0.92 mg/dL (ref 0.40–1.50)
Calcium: 9.4 mg/dL (ref 8.4–10.5)
Chloride: 102 mEq/L (ref 96–112)
GFR: 89.87 mL/min (ref >60.00)
GLUCOSE: 115 mg/dL — AB (ref 70–99)
Potassium: 4.7 mEq/L (ref 3.5–5.1)
SODIUM: 138 meq/L (ref 135–145)
TOTAL PROTEIN: 6.8 g/dL (ref 6.0–8.3)

## 2018-02-04 LAB — CBC WITH DIFFERENTIAL/PLATELET
BASOS PCT: 0.7 % (ref 0.0–3.0)
Basophils Absolute: 0 10*3/uL (ref 0.0–0.1)
EOS ABS: 0.2 10*3/uL (ref 0.0–0.7)
EOS PCT: 4.2 % (ref 0.0–5.0)
HEMATOCRIT: 38.9 % — AB (ref 39.0–52.0)
HEMOGLOBIN: 13.6 g/dL (ref 13.0–17.0)
LYMPHS PCT: 20.5 % (ref 12.0–46.0)
Lymphs Abs: 1.1 10*3/uL (ref 0.7–4.0)
MCHC: 35 g/dL (ref 30.0–36.0)
MCV: 93.2 fl (ref 78.0–100.0)
Monocytes Absolute: 0.6 10*3/uL (ref 0.1–1.0)
Monocytes Relative: 10.8 % (ref 3.0–12.0)
Neutro Abs: 3.3 10*3/uL (ref 1.4–7.7)
Neutrophils Relative %: 63.8 % (ref 43.0–77.0)
PLATELETS: 258 10*3/uL (ref 150.0–400.0)
RBC: 4.17 Mil/uL — ABNORMAL LOW (ref 4.22–5.81)
RDW: 13 % (ref 11.5–15.5)
WBC: 5.2 10*3/uL (ref 4.0–10.5)

## 2018-02-04 LAB — LIPID PANEL
CHOL/HDL RATIO: 5
CHOLESTEROL: 153 mg/dL (ref 0–200)
HDL: 33.6 mg/dL — ABNORMAL LOW (ref >39.00)
Triglycerides: 491 mg/dL — ABNORMAL HIGH (ref 0.0–149.0)

## 2018-02-04 LAB — PSA: PSA: 0.68 ng/mL (ref 0.10–4.00)

## 2018-02-04 LAB — LDL CHOLESTEROL, DIRECT: Direct LDL: 61 mg/dL

## 2018-02-07 ENCOUNTER — Other Ambulatory Visit: Payer: Self-pay | Admitting: Family Medicine

## 2018-02-07 MED ORDER — FISH OIL 1000 MG PO CAPS: 2.0000 | ORAL_CAPSULE | Freq: Every day | ORAL | 0 refills | Status: DC

## 2018-02-11 ENCOUNTER — Ambulatory Visit (AMBULATORY_SURGERY_CENTER): Payer: Self-pay

## 2018-02-11 VITALS — Ht 71.0 in | Wt 201.6 lb

## 2018-02-11 DIAGNOSIS — Z1211 Encounter for screening for malignant neoplasm of colon: Secondary | ICD-10-CM

## 2018-02-11 MED ORDER — NA SULFATE-K SULFATE-MG SULF 17.5-3.13-1.6 GM/177ML PO SOLN
1.0000 | Freq: Once | ORAL | 0 refills | Status: AC
Start: 2018-02-11 — End: 2018-02-11

## 2018-02-11 NOTE — Progress Notes (Signed)
Pt's states no medical or surgical changes since previsit or office visit. 

## 2018-02-11 NOTE — Progress Notes (Signed)
Per pt, no allergies to soy or egg products.Pt not taking any weight loss meds or using  O2 at home.  Pt refused emmi video. 

## 2018-02-23 ENCOUNTER — Ambulatory Visit (INDEPENDENT_AMBULATORY_CARE_PROVIDER_SITE_OTHER): Payer: 59 | Admitting: Neurology

## 2018-02-23 ENCOUNTER — Encounter: Payer: Self-pay | Admitting: Neurology

## 2018-02-23 ENCOUNTER — Ambulatory Visit: Payer: 59 | Admitting: Neurology

## 2018-02-23 DIAGNOSIS — I639 Cerebral infarction, unspecified: Secondary | ICD-10-CM

## 2018-02-23 DIAGNOSIS — G603 Idiopathic progressive neuropathy: Secondary | ICD-10-CM | POA: Diagnosis not present

## 2018-02-23 DIAGNOSIS — R200 Anesthesia of skin: Secondary | ICD-10-CM

## 2018-02-23 NOTE — Progress Notes (Addendum)
The patient comes in today for EMG nerve conduction study evaluation.  Nerve conduction studies of both legs show some mild motor and sensory involvement, there may be a very low-grade neuropathy.  EMG of the left leg does not show evidence of an overlying lumbosacral radiculopathy.  The patient will have blood work done today, is not clear what is causing all of his symptoms.  The patient began having alteration in sensation of the left leg following a nerve block, the may be some mild sympathetic nerve damage to the leg, the patient has left knee swelling and pain following the surgery.  He also has some numbness and tingling in both feet, worse on the left.  If the blood work is unremarkable, at some point in the future we may consider MRI of the lumbar spine.  The patient did report a transient event 2 weeks ago of difficulty with walking and staggering similar to his prior stroke event, it was better by the morning, he did not go to the hospital.  If this recurs, the patient is to call 911 and go to the hospital.   [facility]    Nerve / Sites Muscle Latency Ref. Amplitude Ref. Rel Amp Segments Distance Velocity Ref. Area    ms ms mV mV %  cm m/s m/s mVms  R Peroneal - EDB     Ankle EDB 5.1 ?6.5 0.6 ?2.0 100 Ankle - EDB 9   3.5     Fib head EDB 12.7  0.6  96.4 Fib head - Ankle 34 44 ?44 3.2     Pop fossa EDB 15.0  0.7  111 Pop fossa - Fib head 10 44 ?44 4.1         Pop fossa - Ankle      L Peroneal - EDB     Ankle EDB 5.6 ?6.5 1.9 ?2.0 100 Ankle - EDB 9   6.6     Fib head EDB 13.2  2.2  117 Fib head - Ankle 34 45 ?44 10.0     Pop fossa EDB 15.4  2.1  94.6 Pop fossa - Fib head 10 45 ?44 9.8         Pop fossa - Ankle      R Tibial - AH     Ankle AH 4.9 ?5.8 3.6 ?4.0 100 Ankle - AH 9   6.3     Pop fossa AH 15.6  1.7  48.3 Pop fossa - Ankle 40 38 ?41 6.6  L Tibial - AH     Ankle AH 4.3 ?5.8 4.8 ?4.0 100 Ankle - AH 9   11.7     Pop fossa AH 14.1  4.1  85.2 Pop fossa - Ankle 40 41 ?41 10.8      SNC    Nerve / Sites Rec. Site Peak Lat Ref.  Amp Ref. Segments Distance    ms ms V V  cm  R Sural - Ankle (Calf)     Calf Ankle 4.2 ?4.4 3 ?6 Calf - Ankle 14  L Sural - Ankle (Calf)     Calf Ankle 4.0 ?4.4 4 ?6 Calf - Ankle 14  R Superficial peroneal - Ankle     Lat leg Ankle 4.6 ?4.4 3 ?6 Lat leg - Ankle 14  L Superficial peroneal - Ankle     Lat leg Ankle 4.3 ?4.4 2 ?6 Lat leg - Ankle 14              F  Wave    Nerve F Lat Ref.   ms ms  R Tibial - AH 58.3 ?56.0  L Tibial - AH 55.5 ?56.0

## 2018-02-23 NOTE — Procedures (Signed)
     HISTORY:  Patrick Bailey is a 57 year old gentleman with a history of altered sensation of the left leg following knee surgery, he has ongoing pain and swelling around the left knee.  He has some tingling in both feet.  He is being evaluated for a possible neuropathy or a lumbosacral radiculopathy.  NERVE CONDUCTION STUDIES:  Nerve conduction studies were performed on both lower extremities.  The distal motor latencies for the peroneal and posterior tibial nerves were normal bilaterally with low motor amplitudes seen for the peroneal nerves bilaterally and for the right posterior tibial nerve, normal for the left posterior tibial nerve.  Slight slowing was seen for the right posterior tibial nerve, otherwise nerve conduction velocities were normal for the peroneal nerves and for the left posterior tibial nerve.  The sensory latencies for the peroneal nerves were slightly prolonged on the right, normal on the left and normal for the sural nerve bilaterally.  The amplitudes for the sensory responses were low bilaterally.  The F-wave latencies for the posterior tibial nerves were prolonged bilaterally.  EMG STUDIES:  EMG study was performed on the left lower extremity:  The tibialis anterior muscle reveals 2 to 4K motor units with full recruitment. No fibrillations or positive waves were seen. The peroneus tertius muscle reveals 2 to 4K motor units with full recruitment. No fibrillations or positive waves were seen. Mild polyphasic waves were seen. The medial gastrocnemius muscle reveals 1 to 3K motor units with full recruitment. No fibrillations or positive waves were seen. The vastus lateralis muscle reveals 2 to 4K motor units with full recruitment. No fibrillations or positive waves were seen. The iliopsoas muscle reveals 2 to 4K motor units with full recruitment. No fibrillations or positive waves were seen. The biceps femoris muscle (long head) reveals 2 to 4K motor units with full  recruitment. No fibrillations or positive waves were seen. The lumbosacral paraspinal muscles were tested at 3 levels, and revealed no abnormalities of insertional activity at all 3 levels tested. There was good relaxation.   IMPRESSION:  Nerve conduction studies done on both lower extremities show probably motor findings with lower motor amplitudes and slight slowing of the right posterior tibial nerve, mild sensory alterations were also noted.  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 EMG evaluation of the left lower extremity did not show findings of an overlying radiculopathy.  Jill Alexanders MD 02/23/2018 4:27 PM  [facility] [address] [place], [place] [address]  Phone [phone] Fax [phone]

## 2018-02-23 NOTE — Progress Notes (Signed)
Please refer to EMG and NCV procedure note. 

## 2018-02-27 LAB — MULTIPLE MYELOMA PANEL, SERUM
ALBUMIN SERPL ELPH-MCNC: 4.1 g/dL (ref 2.9–4.4)
ALPHA 1: 0.2 g/dL (ref 0.0–0.4)
ALPHA2 GLOB SERPL ELPH-MCNC: 0.8 g/dL (ref 0.4–1.0)
Albumin/Glob SerPl: 1.4 (ref 0.7–1.7)
B-Globulin SerPl Elph-Mcnc: 1 g/dL (ref 0.7–1.3)
Gamma Glob SerPl Elph-Mcnc: 1 g/dL (ref 0.4–1.8)
Globulin, Total: 3 g/dL (ref 2.2–3.9)
IGA/IMMUNOGLOBULIN A, SERUM: 230 mg/dL (ref 90–386)
IGG (IMMUNOGLOBIN G), SERUM: 1016 mg/dL (ref 700–1600)
IGM (IMMUNOGLOBULIN M), SRM: 106 mg/dL (ref 20–172)
Total Protein: 7.1 g/dL (ref 6.0–8.5)

## 2018-02-27 LAB — B. BURGDORFI ANTIBODIES

## 2018-02-27 LAB — ANGIOTENSIN CONVERTING ENZYME: ANGIO CONVERT ENZYME: 28 U/L (ref 14–82)

## 2018-02-27 LAB — VITAMIN B12: Vitamin B-12: 568 pg/mL (ref 232–1245)

## 2018-02-27 LAB — ANA W/REFLEX: ANA: NEGATIVE

## 2018-02-27 LAB — RPR: RPR Ser Ql: NONREACTIVE

## 2018-02-27 LAB — HIV ANTIBODY (ROUTINE TESTING W REFLEX): HIV SCREEN 4TH GENERATION: NONREACTIVE

## 2018-02-27 LAB — SEDIMENTATION RATE: Sed Rate: 2 mm/hr (ref 0–30)

## 2018-02-28 ENCOUNTER — Telehealth: Payer: Self-pay | Admitting: *Deleted

## 2018-02-28 NOTE — Telephone Encounter (Signed)
Called, LVM for pt about unremarkable labs per CW,MD note. Gave [facility] phone number if they have further questions or concerns.

## 2018-02-28 NOTE — Telephone Encounter (Signed)
-----   Message from Kathrynn Ducking, MD sent at 02/27/2018  5:53 PM EDT -----  The blood work results are unremarkable. Please call the patient.  ----- Message ----- From: Lavone Neri Lab Results In Sent: 02/24/2018   7:39 AM To: Kathrynn Ducking, MD

## 2018-03-03 HISTORY — PX: COLONOSCOPY: SHX174

## 2018-03-04 IMAGING — MR MR KNEE*L* W/O CM
7 series · 37 of 40 positions shown · non-contrast
Comparison: Radiographs dated 10/19/2014

CLINICAL DATA: Pain, swelling, and popping for 7 months.

EXAM:
MRI OF THE LEFT KNEE WITHOUT CONTRAST
TECHNIQUE: Multiplanar, multisequence MR imaging of the knee was performed. No
intravenous contrast was administered.

[Series 3: PD fat-sat · axial · 3.0mm · 0.50mm/px · z∈[-77,+42]mm · 8 of 37 slices shown (1 of 4)]
[im 1/37]
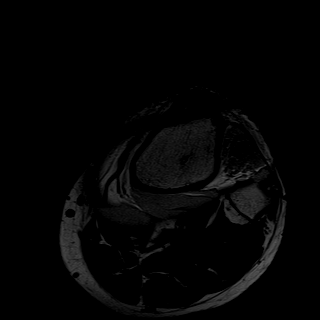
[im 6/37]
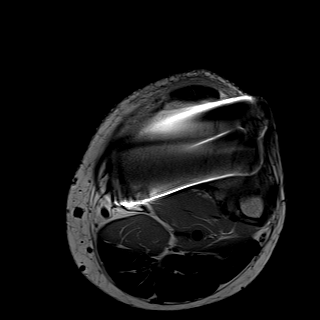
[im 11/37]
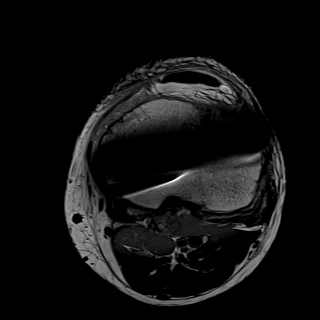
[im 16/37]
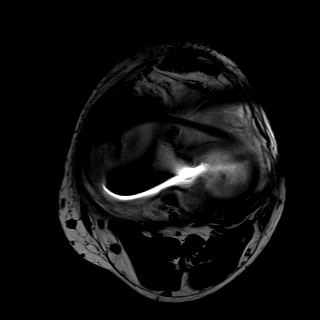
[im 21/37]
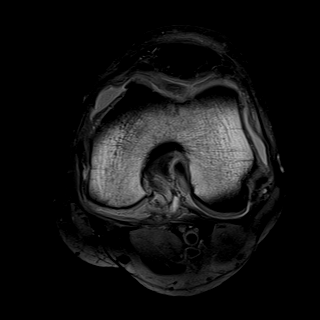
[im 26/37]
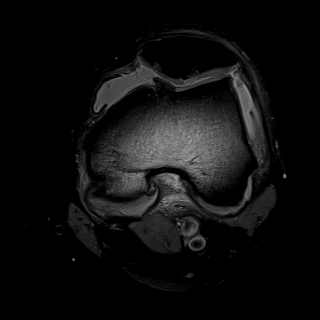
[im 31/37]
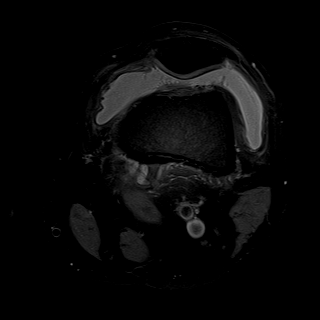
[im 37/37]
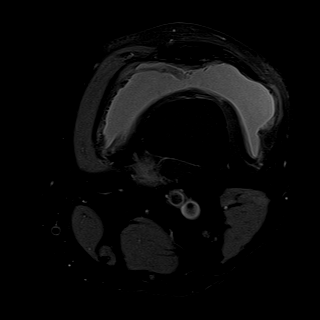

[Series 4: T1 · coronal · 3.0mm · 0.50mm/px · 6 of 27 slices shown]
[im 1/27]
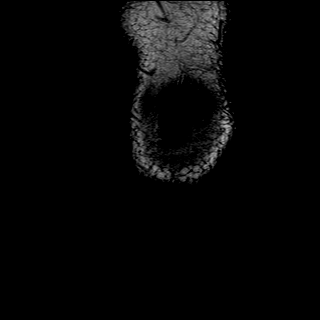
[im 6/27]
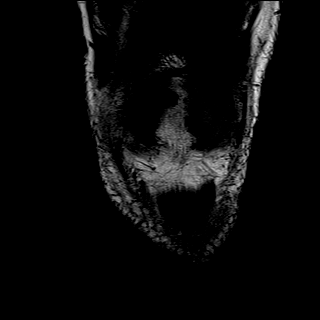
[im 11/27]
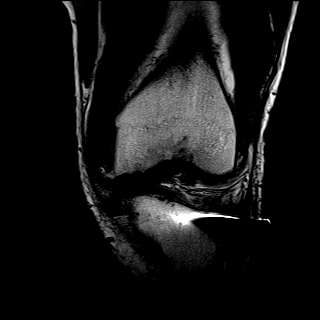
[im 16/27]
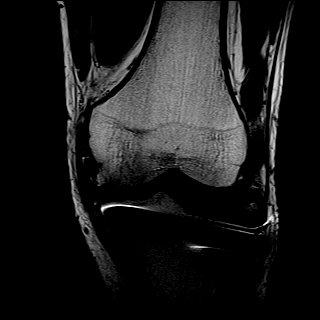
[im 21/27]
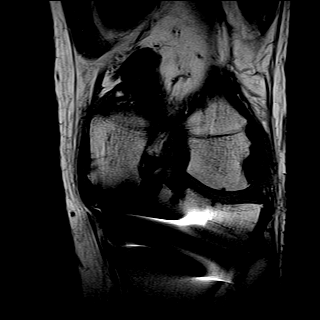
[im 27/27]
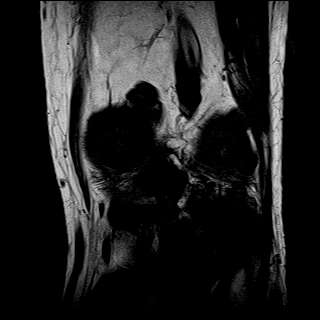

[Series 5: PD fat-sat · sagittal · 3.0mm · 0.50mm/px · 6 of 30 slices shown (2 of 4)]
[im 1/30]
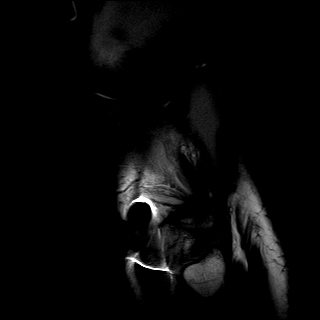
[im 6/30]
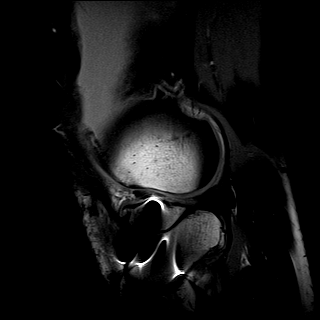
[im 12/30]
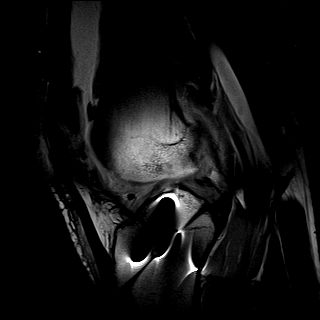
[im 18/30]
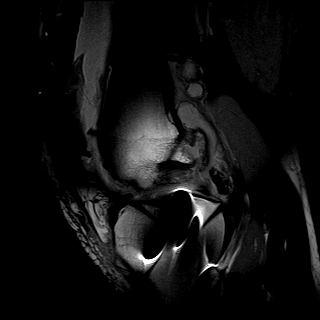
[im 24/30]
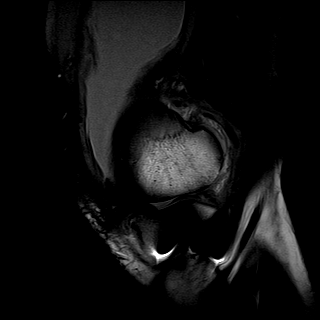
[im 30/30]
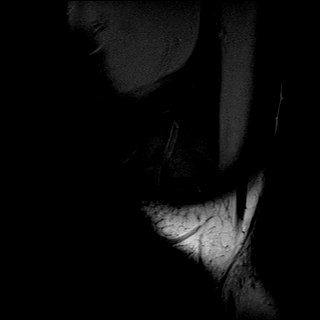

[Series 6: T2 · coronal · 3.0mm · 0.50mm/px · 3 of 28 slices shown]
[im 1/28]
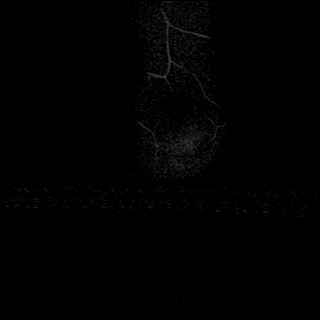
[im 6/28]
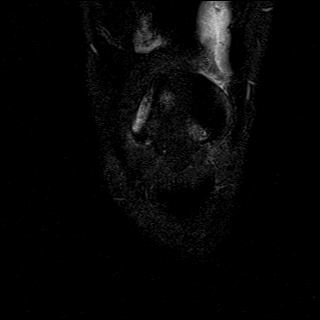
[im 11/28]
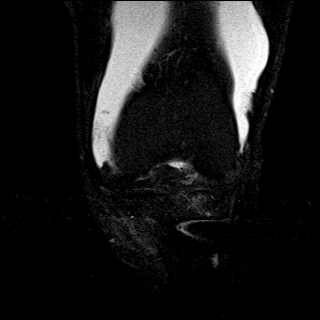

[Series 7: T2 fat-sat · coronal · 3.0mm · 0.31mm/px · 6 of 28 slices shown]
[im 1/28]
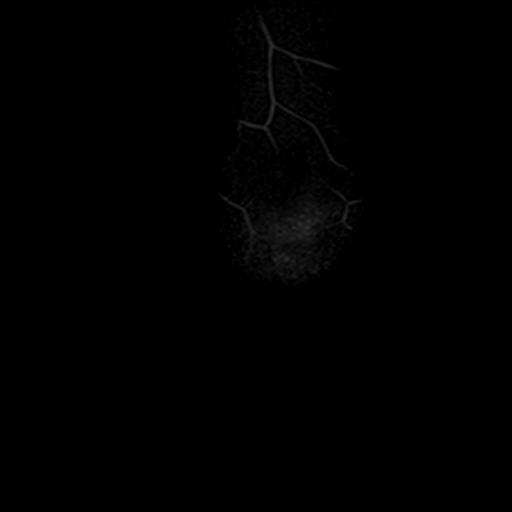
[im 6/28]
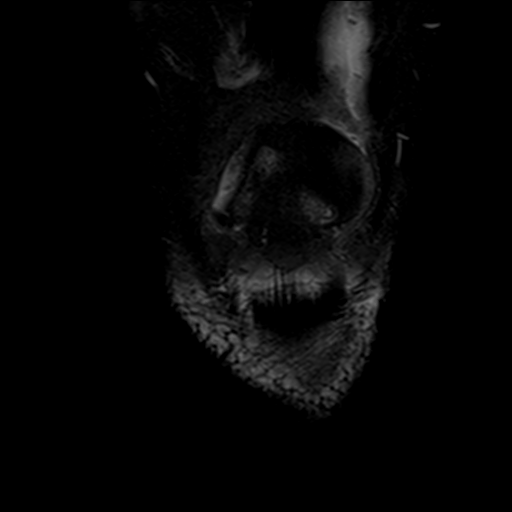
[im 11/28]
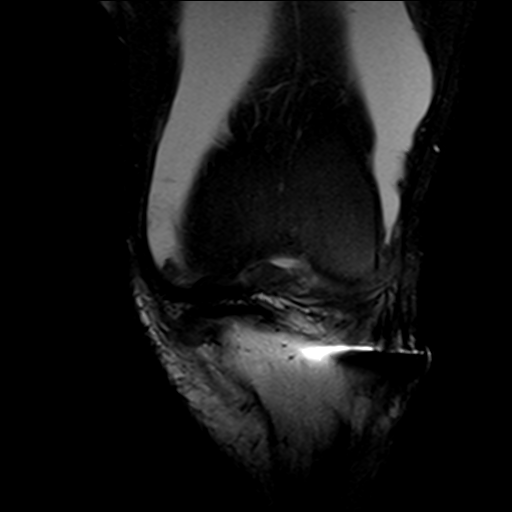
[im 17/28]
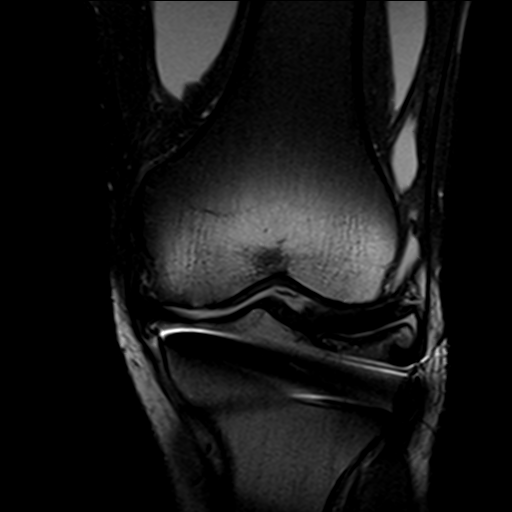
[im 22/28]
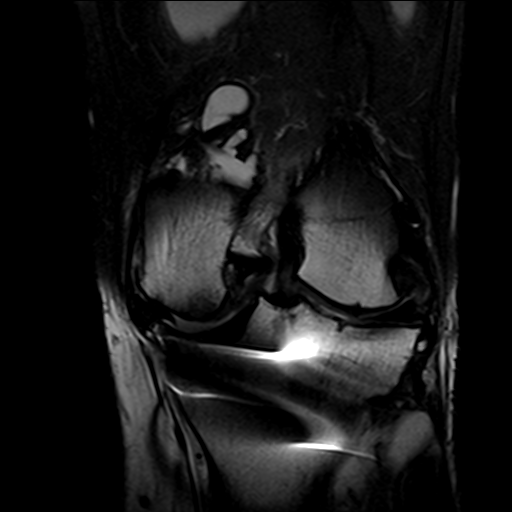
[im 28/28]
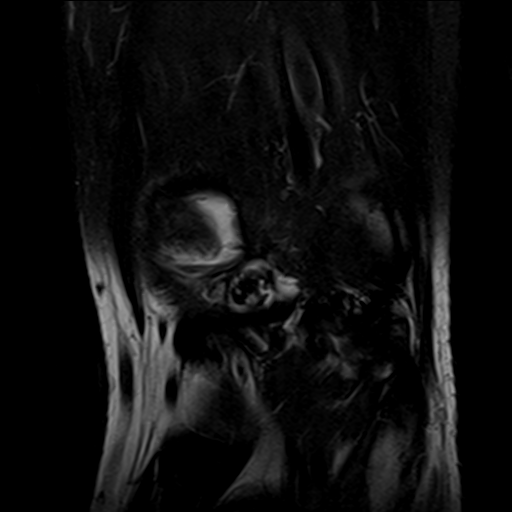

[Series 8: PD fat-sat · coronal · 3.0mm · 0.50mm/px · 6 of 28 slices shown (3 of 4)]
[im 1/28]
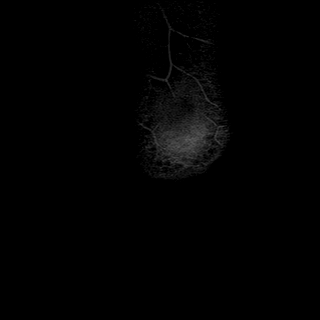
[im 6/28]
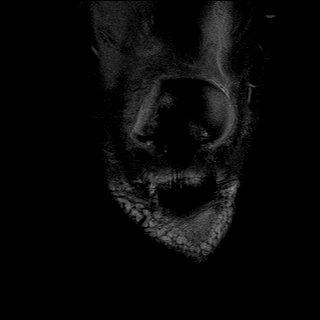
[im 11/28]
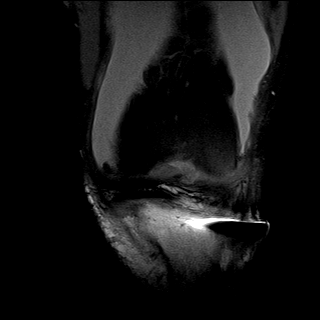
[im 17/28]
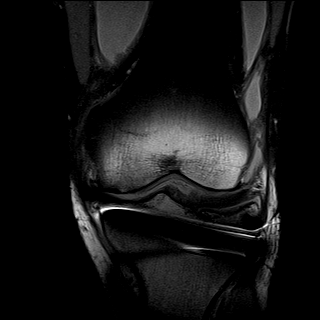
[im 22/28]
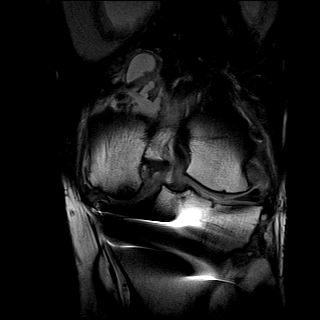
[im 28/28]
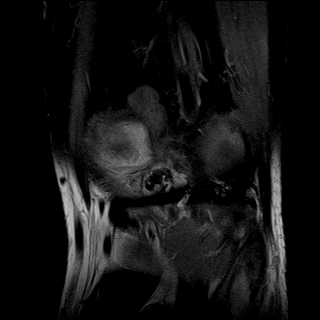

[Series 9: PD fat-sat · oblique · 2.0mm · 0.62mm/px · 2 of 7 slices shown (4 of 4)]
[im 1/7]
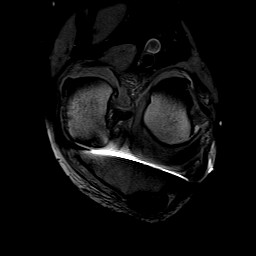
[im 7/7]
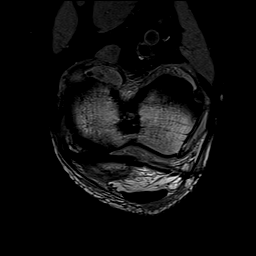

[37 of 40 positions shown; findings below may reference images not displayed]

FINDINGS: MENISCI

Medial meniscus: No definitive tear. Suboptimally visualized due to
metallic artifact from the screws in the proximal tibia.

Lateral meniscus:  Intact.

LIGAMENTS

Cruciates:  Intact ACL and PCL.

Collaterals: Medial collateral ligament is intact. Lateral
collateral ligament complex is intact.

CARTILAGE

Patellofemoral: Multiple areas of partial and full-thickness
cartilage loss involving the apex and medial and lateral facets of
the patella. Focal full-thickness cartilage loss of the lateral
aspect of the trochlear groove.

Medial: Large area of full-thickness cartilage loss of the central
portion of the femoral condyle with subcortical sclerosis and small
cortical erosions of the condyle. Detail of the cartilage of the
tibia is obscured by metallic artifact.

Lateral:  Suboptimally visualized.  No focal lesions identified.

Joint: Large joint effusion. 2 cm irregularly calcified loose body
in the posterior central aspect of the joint posterior to the
posterior cruciate ligament.

Popliteal Fossa: No Baker's cyst. Complex ganglion cysts adjacent to
the origin of the medial head of the gastrocnemius. Less prominent
complex ganglion cyst adjacent to the origin of the lateral head of
the gastrocnemius.

Extensor Mechanism:  Intact quadriceps tendon and patellar tendon.

Bones:  Tricompartmental marginal osteophytes.

Other: None
IMPRESSION: 1. Moderate osteoarthritis of the medial and patellofemoral
compartments.
2. Large joint effusion with large calcified loose body in the
posterior central aspect of the joint
3. The menisci are suboptimally visualized due to metallic artifact
but there are no discrete meniscal tears.

## 2018-03-11 ENCOUNTER — Encounter: Payer: Self-pay | Admitting: Gastroenterology

## 2018-03-11 ENCOUNTER — Ambulatory Visit (AMBULATORY_SURGERY_CENTER): Payer: 59 | Admitting: Gastroenterology

## 2018-03-11 VITALS — BP 143/91 | HR 63 | Temp 97.8°F | Resp 14 | Ht 70.0 in | Wt 202.0 lb

## 2018-03-11 DIAGNOSIS — Z1211 Encounter for screening for malignant neoplasm of colon: Secondary | ICD-10-CM

## 2018-03-11 DIAGNOSIS — D124 Benign neoplasm of descending colon: Secondary | ICD-10-CM

## 2018-03-11 DIAGNOSIS — K635 Polyp of colon: Secondary | ICD-10-CM

## 2018-03-11 DIAGNOSIS — D125 Benign neoplasm of sigmoid colon: Secondary | ICD-10-CM

## 2018-03-11 DIAGNOSIS — D123 Benign neoplasm of transverse colon: Secondary | ICD-10-CM

## 2018-03-11 MED ORDER — SODIUM CHLORIDE 0.9 % IV SOLN
500.0000 mL | Freq: Once | INTRAVENOUS | Status: DC
Start: 2018-03-11 — End: 2018-03-11

## 2018-03-11 NOTE — Op Note (Signed)
[facility] Patient Name: Patrick Bailey Procedure Date: 03/11/2018 9:56 AM MRN: [id number] Endoscopist: Mauri Pole , MD Age: 57 Referring MD:  Date of Birth: [birth year] Gender: Male Account #: [id number] Procedure:                Colonoscopy Indications:              Screening for colorectal malignant neoplasm Medicines:                Monitored Anesthesia Care Procedure:                Pre-Anesthesia Assessment:                           - Prior to the procedure, a History and Physical                            was performed, and patient medications and                            allergies were reviewed. The patient's tolerance of                            previous anesthesia was also reviewed. The risks                            and benefits of the procedure and the sedation                            options and risks were discussed with the patient.                            All questions were answered, and informed consent                            was obtained. Prior Anticoagulants: The patient has                            taken no previous anticoagulant or antiplatelet                            agents. ASA Grade Assessment: II - A patient with                            mild systemic disease. After reviewing the risks                            and benefits, the patient was deemed in                            satisfactory condition to undergo the procedure.                           After obtaining informed consent, the colonoscope  was passed under direct vision. Throughout the                            procedure, the patient's blood pressure, pulse, and                            oxygen saturations were monitored continuously. The                            Colonoscope was introduced through the anus and                            advanced to the the terminal ileum, with                            identification of the  appendiceal orifice and IC                            valve. The colonoscopy was performed without                            difficulty. The patient tolerated the procedure                            well. The quality of the bowel preparation was                            adequate. The terminal ileum, ileocecal valve,                            appendiceal orifice, and rectum were photographed. Scope In: 10:04:05 AM Scope Out: 10:20:44 AM Scope Withdrawal Time: 0 hours 12 minutes 22 seconds  Total Procedure Duration: 0 hours 16 minutes 39 seconds  Findings:                 The perianal and digital rectal examinations were                            normal.                           Two sessile polyps were found in the descending                            colon and transverse colon. The polyps were 1 to 3                            mm in size. These polyps were removed with a cold                            biopsy forceps. Resection and retrieval were                            complete.  A 5 mm polyp was found in the sigmoid colon. The                            polyp was sessile. The polyp was removed with a                            cold snare. Resection and retrieval were complete.                           A 14 mm polyp was found in the sigmoid colon. The                            polyp was pedunculated. The polyp was removed with                            a hot snare. Resection and retrieval were complete.                           A few small and large-mouthed diverticula were                            found in the sigmoid colon, ascending colon and                            cecum.                           Non-bleeding internal hemorrhoids were found during                            retroflexion. The hemorrhoids were small. Complications:            No immediate complications. Estimated Blood Loss:     Estimated blood loss was  minimal. Impression:               - Two 1 to 3 mm polyps in the descending colon and                            in the transverse colon, removed with a cold biopsy                            forceps. Resected and retrieved.                           - One 5 mm polyp in the sigmoid colon, removed with                            a cold snare. Resected and retrieved.                           - One 14 mm polyp in the sigmoid colon, removed  with a hot snare. Resected and retrieved.                           - Diverticulosis in the sigmoid colon, in the                            ascending colon and in the cecum.                           - Non-bleeding internal hemorrhoids. Recommendation:           - Patient has a contact number available for                            emergencies. The signs and symptoms of potential                            delayed complications were discussed with the                            patient. Return to normal activities tomorrow.                            Written discharge instructions were provided to the                            patient.                           - Resume previous diet.                           - Continue present medications.                           - Await pathology results.                           - Repeat colonoscopy in 3 years for surveillance                            based on pathology results.                           - No aspirin, ibuprofen, naproxen, or other                            non-steroidal anti-inflammatory drugs for 2 weeks. Mauri Pole, MD 03/11/2018 10:26:59 AM This report has been signed electronically.

## 2018-03-11 NOTE — Progress Notes (Signed)
Pt's states no medical or surgical changes since previsit or office visit. 

## 2018-03-11 NOTE — Progress Notes (Signed)
Called to room to assist during endoscopic procedure.  Patient ID and intended procedure confirmed with present staff. Received instructions for my participation in the procedure from the performing physician.  

## 2018-03-11 NOTE — Progress Notes (Signed)
Patient states he is going to continue taking his Aspirin, 81 mg, due to history of stroke. Patient awake in Recovery and requesting his father to be with him in Recovery to review results with Dr. Silverio Decamp. Patient requesting father to be present to review discharge instructions in Recovery.

## 2018-03-11 NOTE — Progress Notes (Signed)
Report to PACU, RN, vss, BBS= Clear.  

## 2018-03-11 NOTE — Patient Instructions (Signed)
No Aspirin, Ibuprofen, or Naproxen or other non-steriodal anti-inflammatory drugs for 2 weeks. Continue present medications.  YOU HAD AN ENDOSCOPIC PROCEDURE TODAY AT [facility]:   Refer to the procedure report that was given to you for any specific questions about what was found during the examination.  If the procedure report does not answer your questions, please call your gastroenterologist to clarify.  If you requested that your care partner not be given the details of your procedure findings, then the procedure report has been included in a sealed envelope for you to review at your convenience later.  YOU SHOULD EXPECT: Some feelings of bloating in the abdomen. Passage of more gas than usual.  Walking can help get rid of the air that was put into your GI tract during the procedure and reduce the bloating. If you had a lower endoscopy (such as a colonoscopy or flexible sigmoidoscopy) you may notice spotting of blood in your stool or on the toilet paper. If you underwent a bowel prep for your procedure, you may not have a normal bowel movement for a few days.  Please Note:  You might notice some irritation and congestion in your nose or some drainage.  This is from the oxygen used during your procedure.  There is no need for concern and it should clear up in a day or so.  SYMPTOMS TO REPORT IMMEDIATELY:   Following lower endoscopy (colonoscopy or flexible sigmoidoscopy):  Excessive amounts of blood in the stool  Significant tenderness or worsening of abdominal pains  Swelling of the abdomen that is new, acute  Fever of 100F or higher    For urgent or emergent issues, a gastroenterologist can be reached at any hour by calling [phone].   DIET:  We do recommend a small meal at first, but then you may proceed to your regular diet.  Drink plenty of fluids but you should avoid alcoholic beverages for 24 hours.  ACTIVITY:  You should plan to take it easy for the rest  of today and you should NOT DRIVE or use heavy machinery until tomorrow (because of the sedation medicines used during the test).    FOLLOW UP: Our staff will call the number listed on your records the next business day following your procedure to check on you and address any questions or concerns that you may have regarding the information given to you following your procedure. If we do not reach you, we will leave a message.  However, if you are feeling well and you are not experiencing any problems, there is no need to return our call.  We will assume that you have returned to your regular daily activities without incident.  If any biopsies were taken you will be contacted by phone or by letter within the next 1-3 weeks.  Please call us at [phone] if you have not heard about the biopsies in 3 weeks.    SIGNATURES/CONFIDENTIALITY: You and/or your care partner have signed paperwork which will be entered into your electronic medical record.  These signatures attest to the fact that that the information above on your After Visit Summary has been reviewed and is understood.  Full responsibility of the confidentiality of this discharge information lies with you and/or your care-partner.

## 2018-03-13 ENCOUNTER — Encounter: Payer: Self-pay | Admitting: Family Medicine

## 2018-03-13 DIAGNOSIS — G609 Hereditary and idiopathic neuropathy, unspecified: Secondary | ICD-10-CM | POA: Insufficient documentation

## 2018-03-13 DIAGNOSIS — G629 Polyneuropathy, unspecified: Secondary | ICD-10-CM | POA: Insufficient documentation

## 2018-03-14 ENCOUNTER — Telehealth: Payer: Self-pay

## 2018-03-14 NOTE — Telephone Encounter (Signed)
  Follow up Call-  Call back number 03/11/2018  Post procedure Call Back phone  # [phone]  Permission to leave phone message Yes  Some recent data might be hidden     Patient questions:  Do you have a fever, pain , or abdominal swelling? No. Pain Score  0 *  Have you tolerated food without any problems? Yes.    Have you been able to return to your normal activities? Yes.    Do you have any questions about your discharge instructions: Diet   No. Medications  No. Follow up visit  No.  Do you have questions or concerns about your Care? No.  Actions: * If pain score is 4 or above: No action needed, pain <4.

## 2018-03-17 ENCOUNTER — Encounter: Payer: Self-pay | Admitting: Gastroenterology

## 2018-03-23 ENCOUNTER — Encounter: Payer: Self-pay | Admitting: Family Medicine

## 2018-04-01 DIAGNOSIS — M1712 Unilateral primary osteoarthritis, left knee: Secondary | ICD-10-CM | POA: Diagnosis not present

## 2018-05-04 ENCOUNTER — Encounter: Payer: Self-pay | Admitting: Family Medicine

## 2018-05-04 ENCOUNTER — Ambulatory Visit: Payer: 59 | Admitting: Family Medicine

## 2018-05-04 ENCOUNTER — Ambulatory Visit (INDEPENDENT_AMBULATORY_CARE_PROVIDER_SITE_OTHER)
Admission: RE | Admit: 2018-05-04 | Discharge: 2018-05-04 | Disposition: A | Discharge status: Home / Self Care | Payer: 59 | Source: Ambulatory Visit | Attending: Family Medicine | Admitting: Family Medicine

## 2018-05-04 ENCOUNTER — Encounter (INDEPENDENT_AMBULATORY_CARE_PROVIDER_SITE_OTHER): Payer: Self-pay

## 2018-05-04 VITALS — BP 122/82 | HR 72 | Temp 97.8°F | Ht 70.5 in | Wt 204.5 lb

## 2018-05-04 DIAGNOSIS — M7989 Other specified soft tissue disorders: Secondary | ICD-10-CM | POA: Diagnosis not present

## 2018-05-04 DIAGNOSIS — M25521 Pain in right elbow: Secondary | ICD-10-CM | POA: Diagnosis not present

## 2018-05-04 DIAGNOSIS — S59909A Unspecified injury of unspecified elbow, initial encounter: Secondary | ICD-10-CM | POA: Diagnosis not present

## 2018-05-04 DIAGNOSIS — M7021 Olecranon bursitis, right elbow: Secondary | ICD-10-CM

## 2018-05-04 NOTE — Progress Notes (Signed)
BP 122/82 (BP Location: Left Arm, Patient Position: Sitting, Cuff Size: Normal)   Pulse 72   Temp 97.8 F (36.6 C) (Oral)   Ht 5' 10.5" (1.791 m)   Wt 204 lb 8 oz (92.8 kg)   SpO2 98%   BMI 28.93 kg/m    CC: R elbow swelling Subjective:    Patient ID: [name]., male    DOB: 13-Jul-1961, 57 y.o.   MRN: [id number]  HPI: Darryll Raju. is a 57 y.o. male presenting on 05/04/2018 for Joint Swelling (C/o right elbow swelling. Pt fell yesterday at home hitting is elbow on the concrete. Area is painful and swollen. )   DOI: yesterday morning Tripped in the dark at home, tripped over cord and landed on R elbow and knees - mechanical fall. Since then having R elbow pain and swelling. Treated with icing elbow, some ibuprofen for discomfort.   Able to fully flex/extend arm at elbow but with pain  Declines flu shot  Relevant past medical, surgical, family and social history reviewed and updated as indicated. Interim medical history since our last visit reviewed. Allergies and medications reviewed and updated. Outpatient Medications Prior to Visit  Medication Sig Dispense Refill  . amitriptyline (ELAVIL) 75 MG tablet Take 1 tablet (75 mg total) by mouth at bedtime. (Patient taking differently: Take 50 mg by mouth at bedtime. ) 90 tablet 3  . amLODipine (NORVASC) 10 MG tablet TAKE 1 TABLET BY MOUTH ONCE DAILY 90 tablet 3  . aspirin EC 81 MG tablet Take 81 mg by mouth daily.    Marland Kitchen atorvastatin (LIPITOR) 40 MG tablet Take 1 tablet (40 mg total) by mouth daily. 90 tablet 3  . DENTAGEL 1.1 % GEL dental gel Place 1 application onto teeth at bedtime.     . fenofibrate 160 MG tablet Take 1 tablet (160 mg total) by mouth daily. 90 tablet 3  . Ibuprofen-Diphenhydramine Cit (ADVIL PM PO) Take 1 tablet by mouth at bedtime as needed (SLEEP).    Marland Kitchen levothyroxine (SYNTHROID, LEVOTHROID) 150 MCG tablet Take 1 tablet (150 mcg total) by mouth daily. 90 tablet 3  . metoprolol tartrate (LOPRESSOR)  100 MG tablet Take 1 tablet (100 mg total) by mouth 2 (two) times daily. 180 tablet 3  . MITIGARE 0.6 MG CAPS TAKE 1 CAPSULE BY MOUTH IN THE MORNING AS NEEDED 90 capsule 0  . Omega-3 Fatty Acids (FISH OIL) 1000 MG CAPS Take 2 capsules (2,000 mg total) by mouth daily. (Patient taking differently: Take 1 capsule by mouth daily. )  0  . omeprazole (PRILOSEC) 20 MG capsule Take 20 mg by mouth daily.     No facility-administered medications prior to visit.      Per HPI unless specifically indicated in ROS section below Review of Systems     Objective:    BP 122/82 (BP Location: Left Arm, Patient Position: Sitting, Cuff Size: Normal)   Pulse 72   Temp 97.8 F (36.6 C) (Oral)   Ht 5' 10.5" (1.791 m)   Wt 204 lb 8 oz (92.8 kg)   SpO2 98%   BMI 28.93 kg/m   Wt Readings from Last 3 Encounters:  05/04/18 204 lb 8 oz (92.8 kg)  03/11/18 202 lb (91.6 kg)  02/11/18 201 lb 9.6 oz (91.4 kg)    Physical Exam  Constitutional: He appears well-developed and well-nourished. No distress.  Musculoskeletal: Normal range of motion. He exhibits edema.  L elbow WNL R elbow - FROM  flexion/extension at elbow  Tender swelling present at olecranon Tender to palpation throughout posterior elbow   Nursing note and vitals reviewed.  Lab Results  Component Value Date   CREATININE 0.92 02/04/2018   BUN 18 02/04/2018   NA 138 02/04/2018   K 4.7 02/04/2018   CL 102 02/04/2018   CO2 29 02/04/2018       Assessment & Plan:   Problem List Items Addressed This Visit    Olecranon bursitis - Primary    Traumatic olecranon bursitis.  Check xray r/o fracture given marked tenderness throughout posterior elbow - anticipate no fracture as he has preserved ROM in flexion/extension.  Xray - possible fracture of olecranon - will await radiology read.  Discussed etiology and planned treatment.  Swelling already improving on its own - rec against aspiration at this time. Will place in compression wrap, supportive  care reviewed.  Discussed ibuprofen use - will recommend no heavy lifting for 1 wk - letter provided today.       Relevant Orders   DG Elbow Complete Right    Other Visit Diagnoses    Elbow injury, initial encounter       Relevant Orders   DG Elbow Complete Right       No orders of the defined types were placed in this encounter.  Orders Placed This Encounter  Procedures  . DG Elbow Complete Right    Standing Status:   Future    Number of Occurrences:   1    Standing Expiration Date:   07/05/2019    Order Specific Question:   Reason for Exam (SYMPTOM  OR DIAGNOSIS REQUIRED)    Answer:   R elbow injury after fall with bursitis    Order Specific Question:   Preferred imaging location?    Answer:   [facility]    Order Specific Question:   Radiology Contrast Protocol - do NOT remove file path    Answer:   \\[email or web address]    Follow up plan: Return if symptoms worsen or fail to improve.  Ria Bush, MD

## 2018-05-04 NOTE — Assessment & Plan Note (Addendum)
Traumatic olecranon bursitis.  Check xray r/o fracture given marked tenderness throughout posterior elbow - anticipate no fracture as he has preserved ROM in flexion/extension.  Xray - possible fracture of olecranon - will await radiology read.  Discussed etiology and planned treatment.  Swelling already improving on its own - rec against aspiration at this time. Will place in compression wrap, supportive care reviewed.  Discussed ibuprofen use - will recommend no heavy lifting for 1 wk - letter provided today.

## 2018-05-04 NOTE — Patient Instructions (Addendum)
You have a traumatic bursitis of the right elbow.  Compression wrap placed today. We will be in touch with xray results.  Should improve on its own. May continue ibuprofen as needed for discomfort, ice to elbow (wrapped in towel).

## 2018-06-02 ENCOUNTER — Other Ambulatory Visit: Payer: Self-pay | Admitting: Family Medicine

## 2018-06-17 DIAGNOSIS — M7021 Olecranon bursitis, right elbow: Secondary | ICD-10-CM | POA: Diagnosis not present

## 2018-06-17 DIAGNOSIS — M1712 Unilateral primary osteoarthritis, left knee: Secondary | ICD-10-CM | POA: Diagnosis not present

## 2018-06-21 MED ORDER — DENTAGEL 1.1 %
7 refills | 0 days | Status: CP
Start: 2018-06-21 — End: ?

## 2018-06-22 ENCOUNTER — Encounter: Payer: Self-pay | Admitting: Family Medicine

## 2018-06-22 ENCOUNTER — Ambulatory Visit: Payer: 59 | Admitting: Family Medicine

## 2018-06-22 ENCOUNTER — Ambulatory Visit (INDEPENDENT_AMBULATORY_CARE_PROVIDER_SITE_OTHER)
Admission: RE | Admit: 2018-06-22 | Discharge: 2018-06-22 | Disposition: A | Discharge status: Home / Self Care | Payer: 59 | Source: Ambulatory Visit | Attending: Family Medicine | Admitting: Family Medicine

## 2018-06-22 VITALS — BP 134/80 | HR 84 | Temp 97.8°F | Ht 73.0 in | Wt 200.8 lb

## 2018-06-22 DIAGNOSIS — M1120 Other chondrocalcinosis, unspecified site: Secondary | ICD-10-CM | POA: Diagnosis not present

## 2018-06-22 DIAGNOSIS — M25474 Effusion, right foot: Secondary | ICD-10-CM | POA: Diagnosis not present

## 2018-06-22 DIAGNOSIS — L03031 Cellulitis of right toe: Secondary | ICD-10-CM

## 2018-06-22 DIAGNOSIS — S99921A Unspecified injury of right foot, initial encounter: Secondary | ICD-10-CM

## 2018-06-22 DIAGNOSIS — S90221A Contusion of right lesser toe(s) with damage to nail, initial encounter: Secondary | ICD-10-CM | POA: Diagnosis not present

## 2018-06-22 MED ORDER — DOXYCYCLINE HYCLATE 100 MG PO TABS: 100.0000 mg | ORAL_TABLET | Freq: Two times a day (BID) | ORAL | 0 refills | Status: DC

## 2018-06-22 MED ORDER — MITIGARE 0.6 MG PO CAPS: ORAL_CAPSULE | ORAL | 0 refills | Status: DC

## 2018-06-22 NOTE — Assessment & Plan Note (Signed)
Colchicine refilled to use PRN.

## 2018-06-22 NOTE — Patient Instructions (Signed)
Toenail drained today.  xrays today.  Continue dressing changes for drainage.  Take antibiotic sent to pharmacy. Let us know if not improving over time.  Elevate leg, nail may likely fall off over time.

## 2018-06-22 NOTE — Assessment & Plan Note (Addendum)
Dropped heavy metal onto great toe - crushing injury of >400 lbs. Xray performed today - clear on my read ?old injury of 1st MTPJ - no pain at this joint.  Trephination completed for subungal hematoma. Doxycycline to cover possible developing paronychia.  Update if not improving with treatment. Discussed likely nail will fall off.

## 2018-06-22 NOTE — Progress Notes (Signed)
BP 134/80 (BP Location: Left Arm, Patient Position: Sitting, Cuff Size: Normal)   Pulse 84   Temp 97.8 F (36.6 C) (Oral)   Ht 6\' 1"  (1.854 m)   Wt 200 lb 12 oz (91.1 kg)   SpO2 100%   BMI 26.49 kg/m    CC: R foot injury Subjective:    Patient ID: Patrick Bailey., male    DOB: Mar 04, 1961, 57 y.o.   MRN: [id number]  HPI: Patrick Bailey. is a 57 y.o. male presenting on 06/22/2018 for Foot Injury (Pt injured anterior right foot and great toe on 06/16/18. Pt dropped a motor head on the foot. Noticed toe bleeds off and on and is draining pus. )   DOI: 06/16/2018 Dropped heavy piece of steel onto R great toe. Intermittent bleeding, draining fluid since injury from lateral great toenail, darkening of nail.   Tdap 05/2014.   Relevant past medical, surgical, family and social history reviewed and updated as indicated. Interim medical history since our last visit reviewed. Allergies and medications reviewed and updated. Outpatient Medications Prior to Visit  Medication Sig Dispense Refill  . amitriptyline (ELAVIL) 50 MG tablet TAKE 1 TABLET BY MOUTH AT BEDTIME 90 tablet 2  . amLODipine (NORVASC) 10 MG tablet TAKE 1 TABLET BY MOUTH ONCE DAILY 90 tablet 3  . aspirin EC 81 MG tablet Take 81 mg by mouth daily.    Marland Kitchen atorvastatin (LIPITOR) 40 MG tablet Take 1 tablet (40 mg total) by mouth daily. 90 tablet 3  . DENTAGEL 1.1 % GEL dental gel Place 1 application onto teeth at bedtime.     . fenofibrate 160 MG tablet Take 1 tablet (160 mg total) by mouth daily. 90 tablet 3  . Ibuprofen-Diphenhydramine Cit (ADVIL PM PO) Take 1 tablet by mouth at bedtime as needed (SLEEP).    Marland Kitchen levothyroxine (SYNTHROID, LEVOTHROID) 150 MCG tablet Take 1 tablet (150 mcg total) by mouth daily. 90 tablet 3  . metoprolol tartrate (LOPRESSOR) 100 MG tablet Take 1 tablet (100 mg total) by mouth 2 (two) times daily. 180 tablet 3  . Omega-3 Fatty Acids (FISH OIL) 1000 MG CAPS Take 2 capsules (2,000 mg total) by  mouth daily. (Patient taking differently: Take 1 capsule by mouth daily. )  0  . omeprazole (PRILOSEC) 20 MG capsule Take 20 mg by mouth daily.    Marland Kitchen MITIGARE 0.6 MG CAPS TAKE 1 CAPSULE BY MOUTH IN THE MORNING AS NEEDED 90 capsule 0   No facility-administered medications prior to visit.      Per HPI unless specifically indicated in ROS section below Review of Systems     Objective:    BP 134/80 (BP Location: Left Arm, Patient Position: Sitting, Cuff Size: Normal)   Pulse 84   Temp 97.8 F (36.6 C) (Oral)   Ht 6\' 1"  (1.854 m)   Wt 200 lb 12 oz (91.1 kg)   SpO2 100%   BMI 26.49 kg/m   Wt Readings from Last 3 Encounters:  06/22/18 200 lb 12 oz (91.1 kg)  05/04/18 204 lb 8 oz (92.8 kg)  03/11/18 202 lb (91.6 kg)    Physical Exam  Constitutional: He appears well-developed and well-nourished. No distress.  Musculoskeletal: He exhibits no edema.  2+ DP on right Tender to palpation across lateral metatarsals as well as marked tenderness with axial loading of great toe. Able to flex/extend at toe. No pain at 1st MTPJ.  Hematoma under R great toenail.  Abrasion at nailbed  Neurological: He is alert.  Sensation intact Cap refill intact  Skin: Skin is warm and dry. No rash noted. There is erythema (mild redness around base of R 1st great toenail).  Nursing note and vitals reviewed.  Trephination performed on R great toenail, pt tolerated well. Dressing placed, wound care discussed at home.     Assessment & Plan:   Problem List Items Addressed This Visit    Pseudogout    Colchicine refilled to use PRN.      Injury of right foot - Primary    Dropped heavy metal onto great toe - crushing injury of >400 lbs. Xray performed today - clear on my read ?old injury of 1st MTPJ - no pain at this joint.  Trephination completed for subungal hematoma. Doxycycline to cover possible developing paronychia.  Update if not improving with treatment. Discussed likely nail will fall off.        Relevant Orders   DG Foot Complete Right    Other Visit Diagnoses    Traumatic subungual hematoma of toe of right foot, initial encounter       Acute paronychia of toe of right foot           Meds ordered this encounter  Medications  . doxycycline (VIBRA-TABS) 100 MG tablet    Sig: Take 1 tablet (100 mg total) by mouth 2 (two) times daily.    Dispense:  14 tablet    Refill:  0  . MITIGARE 0.6 MG CAPS    Sig: TAKE 1 CAPSULE BY MOUTH IN THE MORNING AS NEEDED    Dispense:  90 capsule    Refill:  0   Orders Placed This Encounter  Procedures  . DG Foot Complete Right    Standing Status:   Future    Number of Occurrences:   1    Standing Expiration Date:   08/23/2019    Order Specific Question:   Reason for Exam (SYMPTOM  OR DIAGNOSIS REQUIRED)    Answer:   R great toe injury dropping metal onto toe    Order Specific Question:   Preferred imaging location?    Answer:   [facility]    Order Specific Question:   Radiology Contrast Protocol - do NOT remove file path    Answer:   \\[email or web address]    Follow up plan: Return if symptoms worsen or fail to improve.  Patrick Bush, MD

## 2018-07-08 DIAGNOSIS — Z0120 Encounter for dental examination and cleaning without abnormal findings: Principal | ICD-10-CM

## 2018-08-14 ENCOUNTER — Other Ambulatory Visit: Payer: Self-pay | Admitting: Family Medicine

## 2018-08-19 DIAGNOSIS — K029 Dental caries, unspecified: Principal | ICD-10-CM

## 2018-08-19 MED ORDER — AMOXICILLIN 500 MG TABLET
ORAL_TABLET | Freq: Three times a day (TID) | ORAL | 0 refills | 0 days | Status: CP
Start: 2018-08-19 — End: 2018-08-26

## 2018-08-19 MED ORDER — OXYCODONE-ACETAMINOPHEN 5 MG-325 MG TABLET
ORAL_TABLET | ORAL | 0 refills | 0 days | Status: CP | PRN
Start: 2018-08-19 — End: 2018-08-23

## 2018-08-19 MED ORDER — IBUPROFEN 600 MG TABLET
ORAL_TABLET | Freq: Four times a day (QID) | ORAL | 0 refills | 0.00000 days | Status: CP | PRN
Start: 2018-08-19 — End: 2018-08-26

## 2018-08-26 ENCOUNTER — Ambulatory Visit
Admit: 2018-08-26 | Discharge: 2018-08-27 | Payer: PRIVATE HEALTH INSURANCE | Attending: General Practice | Primary: General Practice

## 2018-08-26 DIAGNOSIS — Z09 Encounter for follow-up examination after completed treatment for conditions other than malignant neoplasm: Principal | ICD-10-CM

## 2018-09-02 DIAGNOSIS — M25461 Effusion, right knee: Secondary | ICD-10-CM | POA: Diagnosis not present

## 2018-11-01 IMAGING — DX DG KNEE 1-2V PORT*L*
2 series · 2 of 2 positions shown · non-contrast
Comparison: 12/25/2016.

CLINICAL DATA: Total left knee replacement.

EXAM:
PORTABLE LEFT KNEE - 1-2 VIEW

[knee ap]
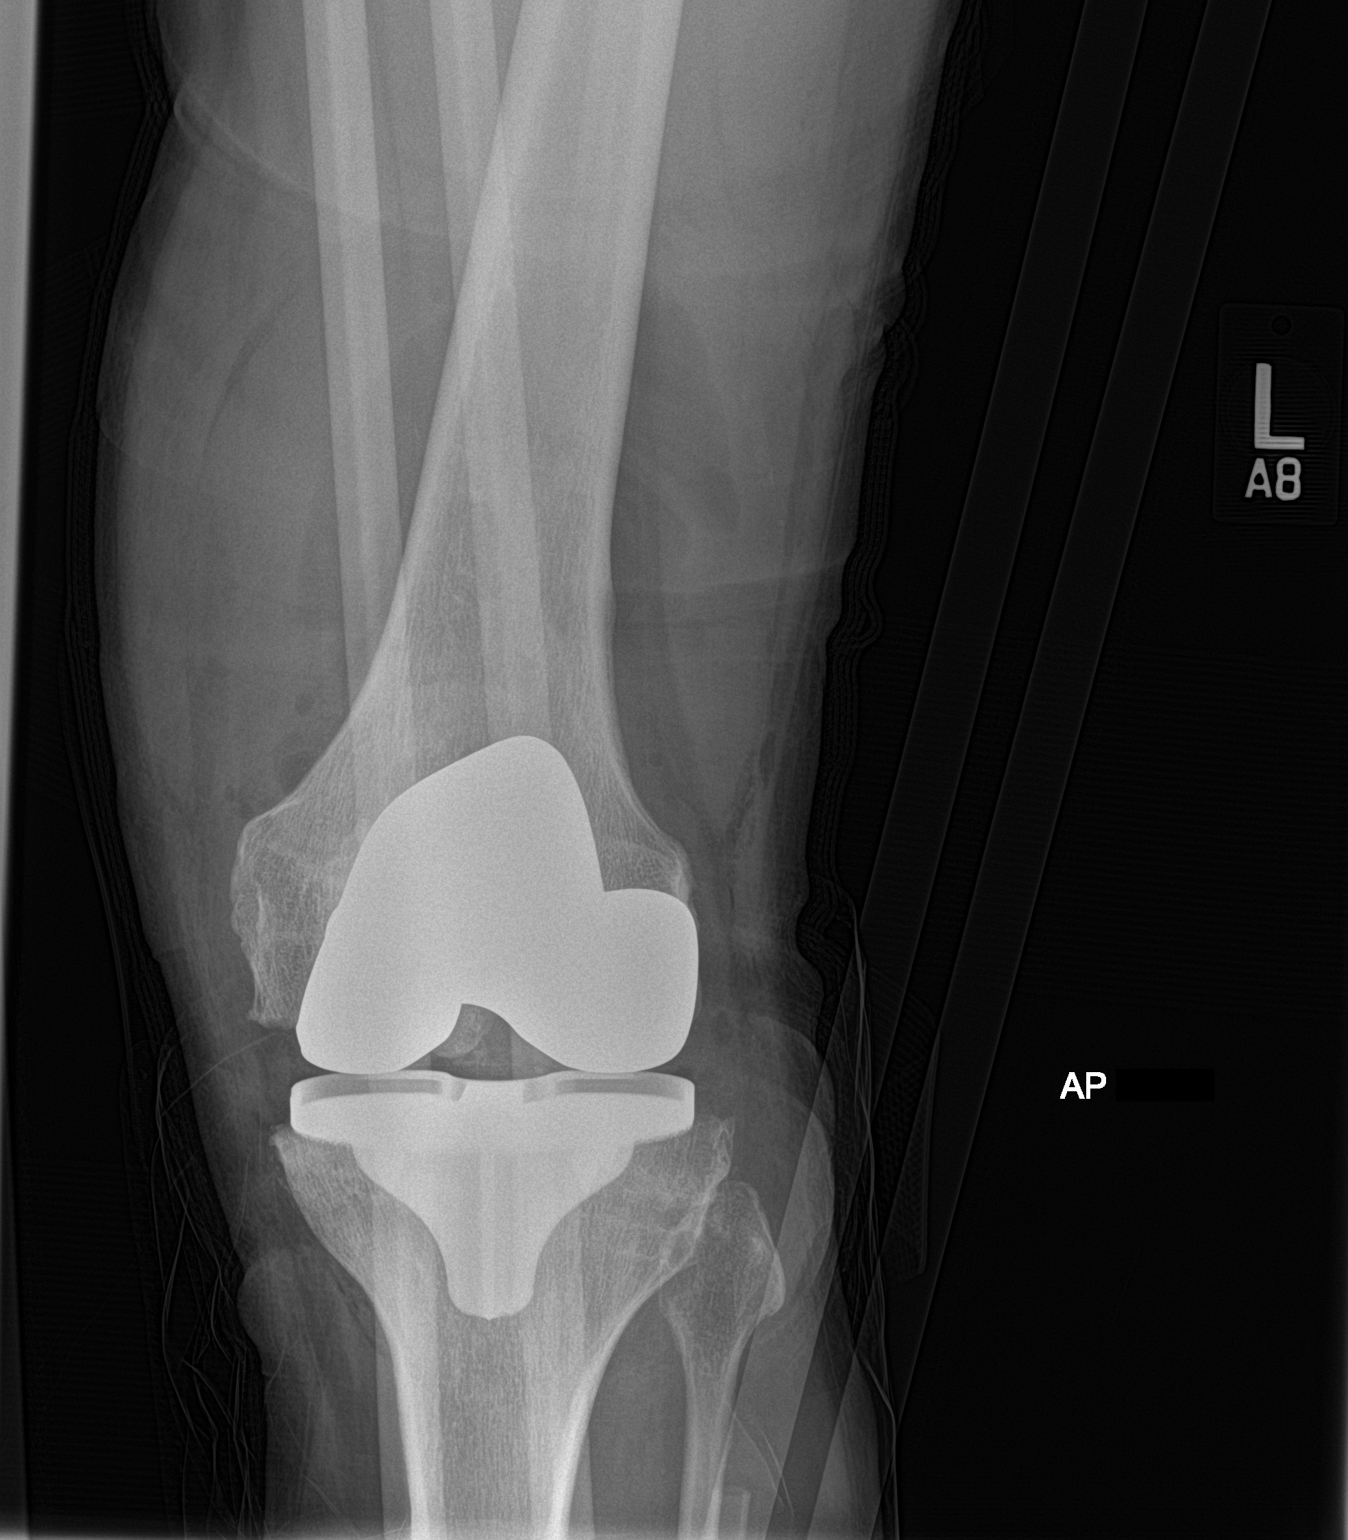

[knee lat]
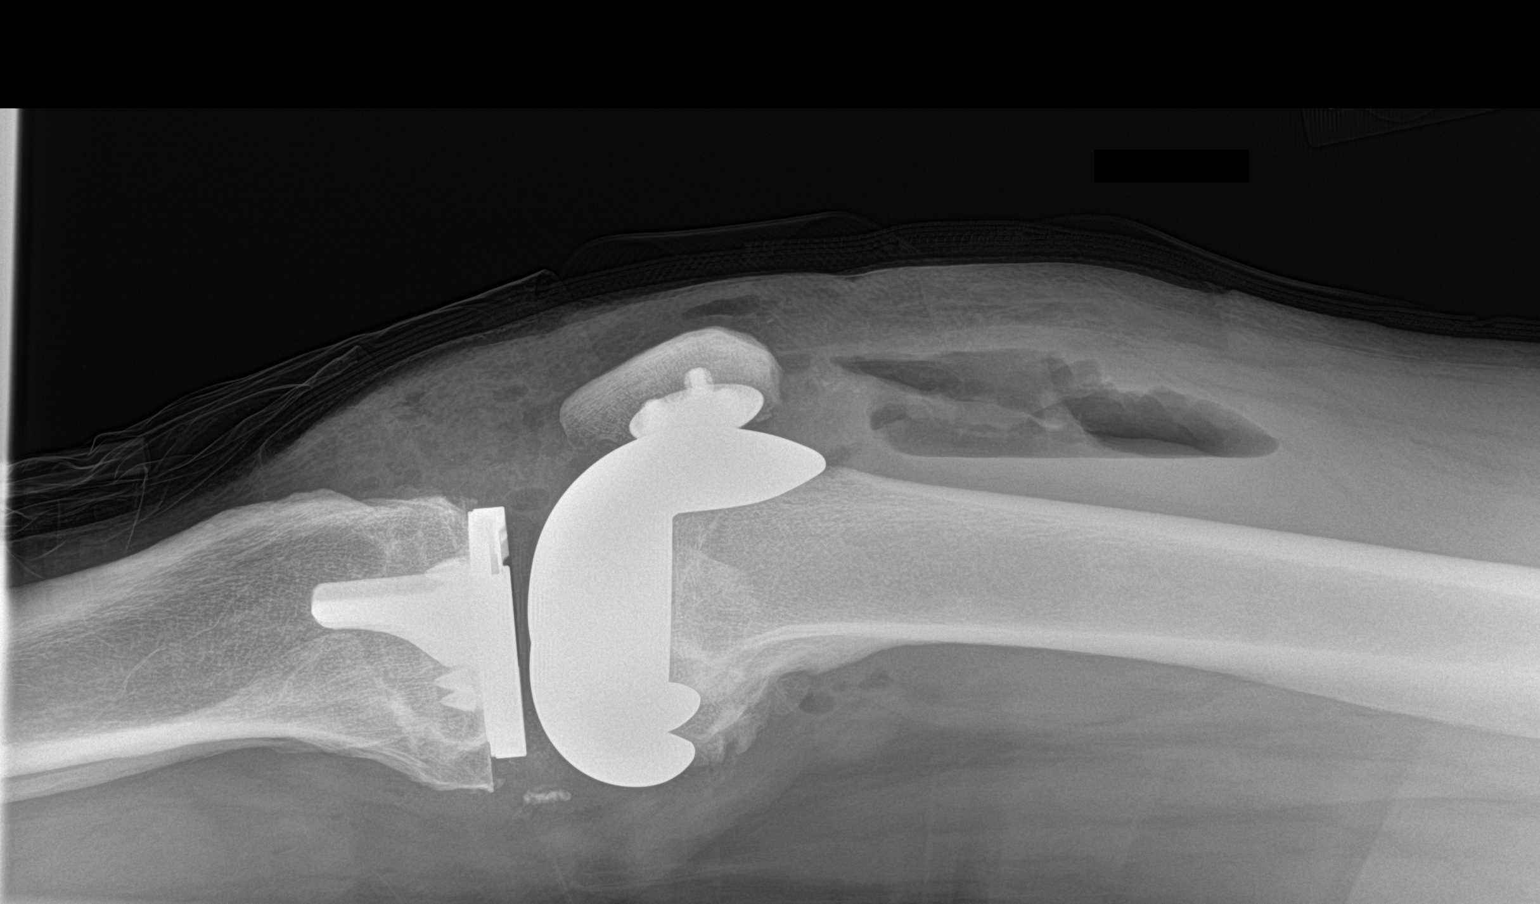

[2 of 2 positions shown; findings below may reference images not displayed]

FINDINGS: Total left knee replacement. Hardware intact. Anatomic alignment. No
acute bony abnormality.
IMPRESSION: Total left knee replacement with anatomic alignment.

## 2018-12-02 ENCOUNTER — Telehealth: Payer: Self-pay | Admitting: Family Medicine

## 2018-12-02 DIAGNOSIS — B009 Herpesviral infection, unspecified: Secondary | ICD-10-CM

## 2018-12-02 MED ORDER — VALACYCLOVIR HCL 1 G PO TABS: 2000.0000 mg | ORAL_TABLET | Freq: Two times a day (BID) | ORAL | 3 refills | Status: DC

## 2018-12-02 NOTE — Telephone Encounter (Signed)
Pt came into office to request Valtrex. States he has taken it in the past. I advised him that he may need an appointment but he does not want to schedule one since he has take medication in the past. He is requesting it to be called into [address].

## 2018-12-02 NOTE — Telephone Encounter (Signed)
Last OV:  06/22/18, acute Next OV:  none

## 2018-12-02 NOTE — Telephone Encounter (Signed)
Spoke with patient - having herpes outbreak on lip. Would like refilled. Started 1 wk ago. Will Rx valtrex treatment dose. Doesn't tend to get frequent flares - none recently.

## 2018-12-13 NOTE — Telephone Encounter (Signed)
Pt said he took the valacyclovir 1000mg  as instructed x 2 different days and it took 2 wks to clear up. Pt wants to get smaller dosage that he can take daily on regular basis. [facility] [address]. Pt request cb.

## 2018-12-14 MED ORDER — VALACYCLOVIR HCL 1 G PO TABS: 2000.0000 mg | ORAL_TABLET | Freq: Two times a day (BID) | ORAL | 1 refills | Status: DC

## 2018-12-14 NOTE — Addendum Note (Signed)
Addended by: Ria Bush on: 12/14/2018 09:37 AM   Modules accepted: Orders

## 2018-12-14 NOTE — Telephone Encounter (Addendum)
I think this is because we started valayclovir too late. Most effective when started right at onset of cold sore. Don't recommend daily treatment given how infrequently he gets a flare (<1 per year).  Recommend we have prescription on hand to start right at onset of symptoms - he should pick up Rx to have on hand for next flare.  If flares are becoming more frequent, let me know.

## 2018-12-14 NOTE — Telephone Encounter (Signed)
Spoke with pt relaying Dr. G's message. Pt verbalizes understanding.  

## 2019-01-02 ENCOUNTER — Telehealth: Payer: Self-pay | Admitting: Family Medicine

## 2019-01-02 NOTE — Telephone Encounter (Signed)
Received preop clearance request from Dr Percell Miller for R knee replacement. I have form. plz call pt and schedule in-office preop visit. Will need EKG, CXR and possibly labs.

## 2019-01-03 ENCOUNTER — Telehealth: Payer: Self-pay

## 2019-01-03 NOTE — Telephone Encounter (Signed)
Received a fax from [employer] asking for Dr. [name] to sign off on a surgical clearance form. Pt will be having right knee replacement  Dr. [name] has signed off on the clearance and I have faxed to [phone].  Confirmation received.

## 2019-01-04 NOTE — Telephone Encounter (Signed)
Pt scheduled 01/06/19 @ 12pm

## 2019-01-06 ENCOUNTER — Other Ambulatory Visit: Payer: Self-pay

## 2019-01-06 ENCOUNTER — Encounter: Payer: Self-pay | Admitting: Family Medicine

## 2019-01-06 ENCOUNTER — Ambulatory Visit: Payer: 59 | Admitting: Family Medicine

## 2019-01-06 ENCOUNTER — Ambulatory Visit (INDEPENDENT_AMBULATORY_CARE_PROVIDER_SITE_OTHER)
Admission: RE | Admit: 2019-01-06 | Discharge: 2019-01-06 | Disposition: A | Discharge status: Home / Self Care | Payer: 59 | Source: Ambulatory Visit | Attending: Family Medicine | Admitting: Family Medicine

## 2019-01-06 VITALS — BP 134/68 | HR 88 | Temp 98.2°F | Ht 73.0 in | Wt 197.4 lb

## 2019-01-06 DIAGNOSIS — Z85819 Personal history of malignant neoplasm of unspecified site of lip, oral cavity, and pharynx: Secondary | ICD-10-CM

## 2019-01-06 DIAGNOSIS — M170 Bilateral primary osteoarthritis of knee: Secondary | ICD-10-CM

## 2019-01-06 DIAGNOSIS — Z8673 Personal history of transient ischemic attack (TIA), and cerebral infarction without residual deficits: Secondary | ICD-10-CM

## 2019-01-06 DIAGNOSIS — E781 Pure hyperglyceridemia: Secondary | ICD-10-CM | POA: Diagnosis not present

## 2019-01-06 DIAGNOSIS — R739 Hyperglycemia, unspecified: Secondary | ICD-10-CM | POA: Diagnosis not present

## 2019-01-06 DIAGNOSIS — Z01818 Encounter for other preprocedural examination: Secondary | ICD-10-CM

## 2019-01-06 DIAGNOSIS — Z7289 Other problems related to lifestyle: Secondary | ICD-10-CM

## 2019-01-06 DIAGNOSIS — I10 Essential (primary) hypertension: Secondary | ICD-10-CM

## 2019-01-06 DIAGNOSIS — E039 Hypothyroidism, unspecified: Secondary | ICD-10-CM | POA: Diagnosis not present

## 2019-01-06 DIAGNOSIS — Z789 Other specified health status: Secondary | ICD-10-CM

## 2019-01-06 LAB — CBC WITH DIFFERENTIAL/PLATELET
Basophils Absolute: 0.1 10*3/uL (ref 0.0–0.1)
Basophils Relative: 0.9 % (ref 0.0–3.0)
Eosinophils Absolute: 0.2 10*3/uL (ref 0.0–0.7)
Eosinophils Relative: 3.6 % (ref 0.0–5.0)
HCT: 39.7 % (ref 39.0–52.0)
Hemoglobin: 14 g/dL (ref 13.0–17.0)
Lymphocytes Relative: 23.3 % (ref 12.0–46.0)
Lymphs Abs: 1.4 10*3/uL (ref 0.7–4.0)
MCHC: 35.3 g/dL (ref 30.0–36.0)
MCV: 95.3 fl (ref 78.0–100.0)
Monocytes Absolute: 0.6 10*3/uL (ref 0.1–1.0)
Monocytes Relative: 9.8 % (ref 3.0–12.0)
Neutro Abs: 3.7 10*3/uL (ref 1.4–7.7)
Neutrophils Relative %: 62.4 % (ref 43.0–77.0)
Platelets: 232 10*3/uL (ref 150.0–400.0)
RBC: 4.16 Mil/uL — ABNORMAL LOW (ref 4.22–5.81)
RDW: 13 % (ref 11.5–15.5)
WBC: 6 10*3/uL (ref 4.0–10.5)

## 2019-01-06 LAB — COMPREHENSIVE METABOLIC PANEL
ALT: 37 U/L (ref 0–53)
AST: 27 U/L (ref 0–37)
Albumin: 4.5 g/dL (ref 3.5–5.2)
Alkaline Phosphatase: 59 U/L (ref 39–117)
BUN: 14 mg/dL (ref 6–23)
CO2: 29 mEq/L (ref 19–32)
Calcium: 9.4 mg/dL (ref 8.4–10.5)
Chloride: 100 mEq/L (ref 96–112)
Creatinine, Ser: 1.01 mg/dL (ref 0.40–1.50)
GFR: 75.68 mL/min (ref >60.00)
Glucose, Bld: 94 mg/dL (ref 70–99)
Potassium: 3.7 mEq/L (ref 3.5–5.1)
Sodium: 136 mEq/L (ref 135–145)
Total Bilirubin: 0.5 mg/dL (ref 0.2–1.2)
Total Protein: 7 g/dL (ref 6.0–8.3)

## 2019-01-06 LAB — PROTIME-INR
INR: 1 ratio (ref 0.8–1.0)
Prothrombin Time: 11.3 s (ref 9.6–13.1)

## 2019-01-06 LAB — LIPID PANEL
Cholesterol: 142 mg/dL (ref 0–200)
HDL: 35.7 mg/dL — ABNORMAL LOW (ref >39.00)
Total CHOL/HDL Ratio: 4
Triglycerides: 427 mg/dL — ABNORMAL HIGH (ref 0.0–149.0)

## 2019-01-06 LAB — BRAIN NATRIURETIC PEPTIDE: Pro B Natriuretic peptide (BNP): 29 pg/mL (ref 0.0–100.0)

## 2019-01-06 LAB — TSH: TSH: 0.83 u[IU]/mL (ref 0.35–4.50)

## 2019-01-06 LAB — LDL CHOLESTEROL, DIRECT: Direct LDL: 56 mg/dL

## 2019-01-06 LAB — HEMOGLOBIN A1C: Hgb A1c MFr Bld: 5.7 % (ref 4.6–6.5)
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
## 2019-01-06 NOTE — Assessment & Plan Note (Addendum)
Drinks 6 beers on weekends, maybe 1 beer on weekdays. Encouraged ongoing efforts at cutting down on weekend alcohol intake.

## 2019-01-06 NOTE — Assessment & Plan Note (Addendum)
H/o this in 07/2016 s/p neurology eval although MRI largely unrevealing. Recovered well.

## 2019-01-06 NOTE — Patient Instructions (Signed)
Labs today EKG and chest xray today.  We will send all results to Dr Percell Miller in preparation for upcoming surgery. I expect you should do well.

## 2019-01-06 NOTE — Assessment & Plan Note (Signed)
Update labs. On fibrate, lipitor, fish oil.

## 2019-01-06 NOTE — Assessment & Plan Note (Signed)
Chronic, stable. Continue current regimen. 

## 2019-01-06 NOTE — Assessment & Plan Note (Signed)
S/p L knee replacement pending R

## 2019-01-06 NOTE — Assessment & Plan Note (Addendum)
Upcoming R total knee replacement by Dr Percell Miller.  RCRI = 1 (h/o mini stroke).  Check EKG, CXR, labs including BNP. If normal, anticipate adequately low risk to proceed with surgery.

## 2019-01-06 NOTE — Assessment & Plan Note (Signed)
Update labs.  

## 2019-01-09 ENCOUNTER — Telehealth: Payer: Self-pay | Admitting: Family Medicine

## 2019-01-09 NOTE — Telephone Encounter (Signed)
Patient is requesting a call back to discuss his cholesterol  He spoke with a nurse earlier today about his lab results but forgot to discuss this with her.

## 2019-01-09 NOTE — Telephone Encounter (Signed)
Spoke with the pt.  

## 2019-01-10 DIAGNOSIS — M1711 Unilateral primary osteoarthritis, right knee: Secondary | ICD-10-CM | POA: Diagnosis present
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
## 2019-01-18 ENCOUNTER — Telehealth: Payer: Self-pay | Admitting: Family Medicine

## 2019-01-18 ENCOUNTER — Ambulatory Visit (INDEPENDENT_AMBULATORY_CARE_PROVIDER_SITE_OTHER): Payer: 59 | Admitting: Family Medicine

## 2019-01-18 ENCOUNTER — Encounter: Payer: Self-pay | Admitting: Family Medicine

## 2019-01-18 DIAGNOSIS — F5104 Psychophysiologic insomnia: Secondary | ICD-10-CM

## 2019-01-18 MED ORDER — DOXEPIN HCL 25 MG PO CAPS: 25.0000 mg | ORAL_CAPSULE | Freq: Every day | ORAL | 3 refills | Status: DC

## 2019-01-18 MED ORDER — DOXEPIN HCL 3 MG PO TABS: 1.0000 | ORAL_TABLET | Freq: Every day | ORAL | 3 refills | Status: DC

## 2019-01-18 NOTE — Telephone Encounter (Signed)
Pt came in said he has not slept in 2 weeks well. His boss has sent him home 3 days. He needs a different medicine. I scheduled a phone visit since he wants to talk with provider asap.

## 2019-01-18 NOTE — Assessment & Plan Note (Signed)
Chronic sleep maintenance insomnia. Previously controlled with amitriptyline, now having increasing trouble less effective despite 75mg  tablets. Sleep hygiene measures reviewed with patient.  Rx silenor 3mg  nightly, if unaffordable may try doxepin 25mg  nightly. Discussed slow taper off amitriptyline.  Update with effect (has f/u in 2 wks). Pt agrees with plan.

## 2019-01-18 NOTE — Progress Notes (Signed)
   Patrick Bailey. - 58 y.o. male  MRN [id number]  Date of Birth: Jul 30, 1961  PCP: Ria Bush, MD  This service was provided via telemedicine. Phone Visit performed on 01/18/2019    Rationale for phone visit along with limitations reviewed. Patient consented to telephone encounter.    Location of patient: home Location of provider: in office, [place] @ [facility] Name of referring provider: N/A   Names of persons and role in encounter: Provider: Ria Bush, MD  Patient: Patrick Bailey.  Other: N/A   Time on call: 12:59pm - 1:09pm   Subjective: Chief Complaint  Patient presents with  . Insomnia    C/o trouble sleeping. Says amitriptyline is no longer working. Pt was sent home from work.      HPI:  Known sleep maintenance insomnia previously well controlled with amitriptyline 50mg  - last year we increased to 75mg  with improvement but seems to be losing effect. Interested in trial of different med. Bedtime 9pm, has routine. wakes up at 4:30am for work. Wakes up every 2 hours - no nocturia. Calm quiet dark environment to sleep. Doesn't have TV in bedroom. A few hours between dinner and bedtime. Caffeine - 1-2 cups in the morning.   Has also tried advil PM and tylenol PM not as effective as he would like. Has tried melatonin without benefit.   Alcohol - continues about 1 beer during week days but more on weekend.   Upcoming TKR Percell Miller) at end of the month. Wants to schedule office visit prior to surgery for rpt FLP - as he's made some healthy diet changes since last reading.    Objective/Observations:  No physical exam or vital signs collected unless specifically identified below.   BP 130/88   Pulse 84   Temp 98 F (36.7 C)   Ht 6\' 1"  (1.854 m)   Wt 202 lb (91.6 kg)   BMI 26.65 kg/m    Respiratory status: speaks in complete sentences without evident shortness of breath.   Assessment/Plan:  Chronic insomnia Chronic sleep maintenance  insomnia. Previously controlled with amitriptyline, now having increasing trouble less effective despite 75mg  tablets. Sleep hygiene measures reviewed with patient.  Rx silenor 3mg  nightly, if unaffordable may try doxepin 25mg  nightly. Discussed slow taper off amitriptyline.  Update with effect (has f/u in 2 wks). Pt agrees with plan.    I discussed the assessment and treatment plan with the patient. The patient was provided an opportunity to ask questions and all were answered. The patient agreed with the plan and demonstrated an understanding of the instructions.  Lab Orders  No laboratory test(s) ordered today    Meds ordered this encounter  Medications  . Doxepin HCl 3 MG TABS    Sig: Take 1 tablet (3 mg total) by mouth at bedtime.    Dispense:  30 tablet    Refill:  3    Preference for silenor 3mg  and not doxepin 25mg  - pending cost to patient.  . doxepin (SINEQUAN) 25 MG capsule    Sig: Take 1 capsule (25 mg total) by mouth at bedtime.    Dispense:  30 capsule    Refill:  3    See other note. Fill if silenor 3mg  unaffordable.    The patient was advised to call back or seek an in-person evaluation if the symptoms worsen or if the condition fails to improve as anticipated.  Ria Bush, MD

## 2019-01-20 NOTE — Patient Instructions (Addendum)
Patrick Bailey.    Your procedure is scheduled on: 01-31-2019   Report to [facility] Main  Entrance    Report to admitting at 745 AM   YOU NEED TO HAVE A COVID 19 TEST ON 01-27-19 @ 9:27 AM, THIS TEST MUST BE DONE BEFORE SURGERY, COME TO [facility]. ONCE YOUR COVID TEST IS COMPLETED, PLEASE BEGIN THE QUARANTINE INSTRUCTIONS AS OUTLINED IN YOUR HANDOUT.   Call this number if you have problems the morning of surgery [phone]    Remember:  NO SOLID FOOD AFTER MIDNIGHT THE NIGHT PRIOR TO SURGERY. NOTHING BY MOUTH EXCEPT CLEAR LIQUIDS UNTIL 715 AM. PLEASE FINISH ENSURE DRINK PER SURGEON ORDER 3 HOURS PRIOR TO SCHEDULED SURGERY TIME WHICH NEEDS TO BE COMPLETED AT 54 AM.    CLEAR LIQUID DIET   Foods Allowed                                                                     Foods Excluded  Coffee and tea, regular and decaf                             liquids that you cannot  Plain Jell-O in any flavor                                             see through such as: Fruit ices (not with fruit pulp)                                     milk, soups, orange juice  Iced Popsicles                                    All solid food Carbonated beverages, regular and diet                                    Cranberry, grape and apple juices Sports drinks like Gatorade Lightly seasoned clear broth or consume(fat free) Sugar, honey syrup  Sample Menu Breakfast                                Lunch                                     Supper Cranberry juice                    Beef broth                            Chicken broth Jell-O  Grape juice                           Apple juice Coffee or tea                        Jell-O                                      Popsicle                                                Coffee or tea                        Coffee or  tea  _____________________________________________________________________     Take these medicines the morning of surgery with A SIP OF WATER: Amlodipine, Atorvastatin, Levothyroxine, Metoprolol, and Omeprazole  BRUSH YOUR TEETH MORNING OF SURGERY AND RINSE YOUR MOUTH OUT, NO CHEWING GUM CANDY OR MINTS.                               You may not have any metal on your body including hair pins and              piercings     Do not wear jewelry, cologne, lotions, powders or deodorant                         Men may shave face and neck.   Do not bring valuables to the hospital. [name]  Contacts, dentures or bridgework may not be worn into surgery.     _____________________________________________________________________             [name] - Preparing for Surgery Before surgery, you can play an important role.  Because skin is not sterile, your skin needs to be as free of germs as possible.  You can reduce the number of germs on your skin by washing with CHG (chlorahexidine gluconate) soap before surgery.  CHG is an antiseptic cleaner which kills germs and bonds with the skin to continue killing germs even after washing. Please DO NOT use if you have an allergy to CHG or antibacterial soaps.  If your skin becomes reddened/irritated stop using the CHG and inform your nurse when you arrive at Short Stay. Do not shave (including legs and underarms) for at least 48 hours prior to the first CHG shower.  You may shave your face/neck. Please follow these instructions carefully:  1.  Shower with CHG Soap the night before surgery and the  morning of Surgery.  2.  If you choose to wash your hair, wash your hair first as usual with your  normal  shampoo.  3.  After you shampoo, rinse your hair and body thoroughly to remove the  shampoo.                           4.  Use CHG as you would any other liquid soap.  You can apply chg directly  to  the skin and wash                       Gently with a scrungie or clean washcloth.  5.  Apply the CHG Soap to your body ONLY FROM THE NECK DOWN.   Do not use on face/ open                           Wound or open sores. Avoid contact with eyes, ears mouth and genitals (private parts).                       Wash face,  Genitals (private parts) with your normal soap.             6.  Wash thoroughly, paying special attention to the area where your surgery  will be performed.  7.  Thoroughly rinse your body with warm water from the neck down.  8.  DO NOT shower/wash with your normal soap after using and rinsing off  the CHG Soap.                9.  Pat yourself dry with a clean towel.            10.  Wear clean pajamas.            11.  Place clean sheets on your bed the night of your first shower and do not  sleep with pets. Day of Surgery : Do not apply any lotions/deodorants the morning of surgery.  Please wear clean clothes to the hospital/surgery center.  FAILURE TO FOLLOW THESE INSTRUCTIONS MAY RESULT IN THE CANCELLATION OF YOUR SURGERY PATIENT SIGNATURE_________________________________  NURSE SIGNATURE__________________________________  ________________________________________________________________________   Patrick Bailey  An incentive spirometer is a tool that can help keep your lungs clear and active. This tool measures how well you are filling your lungs with each breath. Taking long deep breaths may help reverse or decrease the chance of developing breathing (pulmonary) problems (especially infection) following:  A long period of time when you are unable to move or be active. BEFORE THE PROCEDURE   If the spirometer includes an indicator to show your best effort, your nurse or respiratory therapist will set it to a desired goal.  If possible, sit up straight or lean slightly forward. Try not to slouch.  Hold the incentive spirometer in an upright position. INSTRUCTIONS  FOR USE  1. Sit on the edge of your bed if possible, or sit up as far as you can in bed or on a chair. 2. Hold the incentive spirometer in an upright position. 3. Breathe out normally. 4. Place the mouthpiece in your mouth and seal your lips tightly around it. 5. Breathe in slowly and as deeply as possible, raising the piston or the ball toward the top of the column. 6. Hold your breath for 3-5 seconds or for as long as possible. Allow the piston or ball to fall to the bottom of the column. 7. Remove the mouthpiece from your mouth and breathe out normally. 8. Rest for a few seconds and repeat Steps 1 through 7 at least 10 times every 1-2 hours when you are awake. Take your time and take a few normal breaths between deep breaths. 9. The spirometer may include an indicator to show your best effort. Use the indicator as a goal to work  toward during each repetition. 10. After each set of 10 deep breaths, practice coughing to be sure your lungs are clear. If you have an incision (the cut made at the time of surgery), support your incision when coughing by placing a pillow or rolled up towels firmly against it. Once you are able to get out of bed, walk around indoors and cough well. You may stop using the incentive spirometer when instructed by your caregiver.  RISKS AND COMPLICATIONS  Take your time so you do not get dizzy or light-headed.  If you are in pain, you may need to take or ask for pain medication before doing incentive spirometry. It is harder to take a deep breath if you are having pain. AFTER USE  Rest and breathe slowly and easily.  It can be helpful to keep track of a log of your progress. Your caregiver can provide you with a simple table to help with this. If you are using the spirometer at home, follow these instructions: [place] IF:   You are having difficultly using the spirometer.  You have trouble using the spirometer as often as instructed.  Your pain  medication is not giving enough relief while using the spirometer.  You develop fever of 100.5 F (38.1 C) or higher. SEEK IMMEDIATE MEDICAL CARE IF:   You cough up bloody sputum that had not been present before.  You develop fever of 102 F (38.9 C) or greater.  You develop worsening pain at or near the incision site. MAKE SURE YOU:   Understand these instructions.  Will watch your condition.  Will get help right away if you are not doing well or get worse. Document Released: 11/30/2006 Document Revised: 10/12/2011 Document Reviewed: 01/31/2007 [facility] Patient Information 2014 [place], [place].   ________________________________________________________________________

## 2019-01-20 NOTE — Progress Notes (Signed)
MEDICAL CLEARANCE NOTE DR Danise Mina 01-06-19 EKG 01-06-19 EPIC

## 2019-01-23 ENCOUNTER — Other Ambulatory Visit: Payer: Self-pay

## 2019-01-23 ENCOUNTER — Encounter (HOSPITAL_COMMUNITY): Payer: Self-pay

## 2019-01-23 ENCOUNTER — Encounter (HOSPITAL_COMMUNITY)
Admission: RE | Admit: 2019-01-23 | Discharge: 2019-01-23 | Disposition: A | Discharge status: Home / Self Care | Payer: 59 | Source: Ambulatory Visit | Attending: Orthopedic Surgery | Admitting: Orthopedic Surgery

## 2019-01-23 DIAGNOSIS — I69398 Other sequelae of cerebral infarction: Secondary | ICD-10-CM | POA: Diagnosis not present

## 2019-01-23 DIAGNOSIS — Z01818 Encounter for other preprocedural examination: Secondary | ICD-10-CM | POA: Insufficient documentation

## 2019-01-23 DIAGNOSIS — Z923 Personal history of irradiation: Secondary | ICD-10-CM | POA: Insufficient documentation

## 2019-01-23 DIAGNOSIS — E039 Hypothyroidism, unspecified: Secondary | ICD-10-CM | POA: Insufficient documentation

## 2019-01-23 DIAGNOSIS — Z7982 Long term (current) use of aspirin: Secondary | ICD-10-CM | POA: Diagnosis not present

## 2019-01-23 DIAGNOSIS — Z79890 Hormone replacement therapy (postmenopausal): Secondary | ICD-10-CM | POA: Diagnosis not present

## 2019-01-23 DIAGNOSIS — K219 Gastro-esophageal reflux disease without esophagitis: Secondary | ICD-10-CM | POA: Diagnosis not present

## 2019-01-23 DIAGNOSIS — M109 Gout, unspecified: Secondary | ICD-10-CM | POA: Diagnosis not present

## 2019-01-23 DIAGNOSIS — M1711 Unilateral primary osteoarthritis, right knee: Secondary | ICD-10-CM | POA: Diagnosis not present

## 2019-01-23 DIAGNOSIS — Z79899 Other long term (current) drug therapy: Secondary | ICD-10-CM | POA: Insufficient documentation

## 2019-01-23 DIAGNOSIS — Z87891 Personal history of nicotine dependence: Secondary | ICD-10-CM | POA: Insufficient documentation

## 2019-01-23 DIAGNOSIS — Z9221 Personal history of antineoplastic chemotherapy: Secondary | ICD-10-CM | POA: Diagnosis not present

## 2019-01-23 DIAGNOSIS — I10 Essential (primary) hypertension: Secondary | ICD-10-CM | POA: Insufficient documentation

## 2019-01-23 DIAGNOSIS — Z85819 Personal history of malignant neoplasm of unspecified site of lip, oral cavity, and pharynx: Secondary | ICD-10-CM | POA: Diagnosis not present

## 2019-01-23 DIAGNOSIS — R269 Unspecified abnormalities of gait and mobility: Secondary | ICD-10-CM | POA: Insufficient documentation

## 2019-01-23 LAB — BASIC METABOLIC PANEL
Anion gap: 11 (ref 5–15)
BUN: 16 mg/dL (ref 6–20)
CO2: 26 mmol/L (ref 22–32)
Calcium: 9.9 mg/dL (ref 8.9–10.3)
Chloride: 103 mmol/L (ref 98–111)
Creatinine, Ser: 0.89 mg/dL (ref 0.61–1.24)
GFR calc Af Amer: >60 mL/min (ref >60)
GFR calc non Af Amer: >60 mL/min (ref >60)
Glucose, Bld: 120 mg/dL — ABNORMAL HIGH (ref 70–99)
Potassium: 4.7 mmol/L (ref 3.5–5.1)
Sodium: 140 mmol/L (ref 135–145)

## 2019-01-23 LAB — CBC
HCT: 43.9 % (ref 39.0–52.0)
Hemoglobin: 14.8 g/dL (ref 13.0–17.0)
MCH: 32.8 pg (ref 26.0–34.0)
MCHC: 33.7 g/dL (ref 30.0–36.0)
MCV: 97.3 fL (ref 80.0–100.0)
Platelets: 249 10*3/uL (ref 150–400)
RBC: 4.51 MIL/uL (ref 4.22–5.81)
RDW: 12.5 % (ref 11.5–15.5)
WBC: 6.7 10*3/uL (ref 4.0–10.5)
nRBC: 0 % (ref 0.0–0.2)

## 2019-01-23 LAB — SURGICAL PCR SCREEN
MRSA, PCR: NEGATIVE
Staphylococcus aureus: NEGATIVE
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
## 2019-01-27 ENCOUNTER — Other Ambulatory Visit: Payer: Self-pay

## 2019-01-27 ENCOUNTER — Encounter: Payer: Self-pay | Admitting: Family Medicine

## 2019-01-27 ENCOUNTER — Other Ambulatory Visit (HOSPITAL_COMMUNITY)
Admission: RE | Admit: 2019-01-27 | Discharge: 2019-01-27 | Disposition: A | Discharge status: Home / Self Care | Payer: 59 | Source: Ambulatory Visit | Attending: Orthopedic Surgery | Admitting: Orthopedic Surgery

## 2019-01-27 ENCOUNTER — Other Ambulatory Visit: Payer: Self-pay | Admitting: Family Medicine

## 2019-01-27 ENCOUNTER — Other Ambulatory Visit (INDEPENDENT_AMBULATORY_CARE_PROVIDER_SITE_OTHER): Payer: 59

## 2019-01-27 ENCOUNTER — Ambulatory Visit (INDEPENDENT_AMBULATORY_CARE_PROVIDER_SITE_OTHER): Payer: 59 | Admitting: Family Medicine

## 2019-01-27 VITALS — BP 130/88 | HR 73 | Temp 97.8°F | Ht 73.0 in | Wt 196.4 lb

## 2019-01-27 DIAGNOSIS — R399 Unspecified symptoms and signs involving the genitourinary system: Secondary | ICD-10-CM | POA: Insufficient documentation

## 2019-01-27 DIAGNOSIS — R351 Nocturia: Secondary | ICD-10-CM

## 2019-01-27 DIAGNOSIS — E781 Pure hyperglyceridemia: Secondary | ICD-10-CM | POA: Diagnosis not present

## 2019-01-27 DIAGNOSIS — F5104 Psychophysiologic insomnia: Secondary | ICD-10-CM

## 2019-01-27 DIAGNOSIS — Z1159 Encounter for screening for other viral diseases: Secondary | ICD-10-CM | POA: Insufficient documentation

## 2019-01-27 LAB — LDL CHOLESTEROL, DIRECT: Direct LDL: 49 mg/dL

## 2019-01-27 LAB — LIPID PANEL
Cholesterol: 147 mg/dL (ref 0–200)
HDL: 37.2 mg/dL — ABNORMAL LOW (ref >39.00)
Total CHOL/HDL Ratio: 4
Triglycerides: 453 mg/dL — ABNORMAL HIGH (ref 0.0–149.0)

## 2019-01-27 LAB — SARS CORONAVIRUS 2 (TAT 6-24 HRS): SARS Coronavirus 2: NEGATIVE

## 2019-01-27 MED ORDER — TAMSULOSIN HCL 0.4 MG PO CAPS: 0.4000 mg | ORAL_CAPSULE | Freq: Every day | ORAL | 1 refills | Status: DC

## 2019-01-27 MED ORDER — FISH OIL 1000 MG PO CAPS: 2.0000 | ORAL_CAPSULE | Freq: Two times a day (BID) | ORAL | 0 refills | Status: DC

## 2019-01-27 NOTE — Assessment & Plan Note (Addendum)
Compliant with both lipitor, fibrate, and fish oil 2 gm daily. Will update FLP today. The ASCVD Risk score Mikey Bussing DC Jr., et al., 2013) failed to calculate for the following reasons:   The patient has a prior MI or stroke diagnosis

## 2019-01-27 NOTE — Assessment & Plan Note (Addendum)
States doxepin 25mg  not very effective. Will price out silenor 3mg  dose.  Anticipate alcohol use may be contributing to sleep maintenance insomnia.  ?nocturia contributing. He is interested in trying flomax - but will start post-surgery.

## 2019-01-27 NOTE — Patient Instructions (Addendum)
Trial silenor (doxepin) 3mg  at bedtime for sleep (price out) After surgery, may try flomax at night.  Return after surgery for physical - schedule up front.

## 2019-01-27 NOTE — Assessment & Plan Note (Signed)
Endorses x2-3 - ?contributing to insomnia. He is interested in trying flomax - but will start post-surgery. Will trial flomax 0.4mg  next month.

## 2019-01-27 NOTE — Progress Notes (Signed)
This visit was conducted in person.  BP 130/88 (BP Location: Left Arm, Patient Position: Sitting, Cuff Size: Normal)   Pulse 73   Temp 97.8 F (36.6 C) (Tympanic)   Ht 6\' 1"  (1.854 m)   Wt 196 lb 7 oz (89.1 kg)   SpO2 98%   BMI 25.92 kg/m    CC: HLD f/u Subjective:    Patient ID: Patrick Ochoa., male    DOB: 09-Jan-1961, 58 y.o.   MRN: [id number]  HPI: Patrick Carlberg. is a 58 y.o. male presenting on 01/27/2019 for Hyperlipidemia (Here for f/u.) and Insomnia (States he's still having trouble sleeping. )   See prior notes for details. Upcoming knee replacement surgery next week.   Known sleep maintenance insomnia previously we started doxepin 3mg  daily - he filled doxepin 25mg . Bedtime 10pm, wakes up at 4am. Waking up regularly at night time every hour. Nocturia as well.  Lab Results  Component Value Date   PSA 0.68 02/04/2018     HLD - compliant with lipitor 40mg  daily and fenofibrate 160 mg daily. Requests rpt FLP as prior triglycerides markedly elevated.      Relevant past medical, surgical, family and social history reviewed and updated as indicated. Interim medical history since our last visit reviewed. Allergies and medications reviewed and updated. Outpatient Medications Prior to Visit  Medication Sig Dispense Refill  . amLODipine (NORVASC) 10 MG tablet TAKE 1 TABLET BY MOUTH ONCE DAILY 90 tablet 1  . aspirin EC 81 MG tablet Take 81 mg by mouth daily.    Marland Kitchen atorvastatin (LIPITOR) 40 MG tablet Take 1 tablet (40 mg total) by mouth daily. 90 tablet 3  . DENTAGEL 1.1 % GEL dental gel Place 1 application onto teeth at bedtime.     Marland Kitchen doxepin (SINEQUAN) 25 MG capsule Take 1 capsule (25 mg total) by mouth at bedtime. 30 capsule 3  . fenofibrate 160 MG tablet Take 1 tablet (160 mg total) by mouth daily. 90 tablet 3  . Ibuprofen-Diphenhydramine Cit (ADVIL PM PO) Take 1 tablet by mouth at bedtime as needed (SLEEP).    Marland Kitchen levothyroxine (SYNTHROID, LEVOTHROID) 150 MCG tablet  Take 1 tablet (150 mcg total) by mouth daily. (Patient taking differently: Take 150 mcg by mouth daily before breakfast. ) 90 tablet 3  . metoprolol tartrate (LOPRESSOR) 100 MG tablet Take 1 tablet (100 mg total) by mouth 2 (two) times daily. 180 tablet 3  . MITIGARE 0.6 MG CAPS TAKE 1 CAPSULE BY MOUTH IN THE MORNING AS NEEDED 90 capsule 0  . Omega-3 Fatty Acids (FISH OIL) 1000 MG CAPS Take 2 capsules (2,000 mg total) by mouth daily.  0  . omeprazole (PRILOSEC) 20 MG capsule Take 20 mg by mouth daily.    . valACYclovir (VALTREX) 1000 MG tablet Take 2 tablets (2,000 mg total) by mouth 2 (two) times daily. Take 4 tablets per flare, start at onset of symptoms (Patient taking differently: Take 4,000 mg by mouth See admin instructions. Take 4 tablets per flare, start at onset of symptoms) 20 tablet 1  . Doxepin HCl 3 MG TABS Take 1 tablet (3 mg total) by mouth at bedtime. 30 tablet 3   No facility-administered medications prior to visit.      Per HPI unless specifically indicated in ROS section below Review of Systems Objective:    BP 130/88 (BP Location: Left Arm, Patient Position: Sitting, Cuff Size: Normal)   Pulse 73   Temp 97.8 F (36.6 C) (  Tympanic)   Ht 6\' 1"  (1.854 m)   Wt 196 lb 7 oz (89.1 kg)   SpO2 98%   BMI 25.92 kg/m   Wt Readings from Last 3 Encounters:  01/27/19 196 lb 7 oz (89.1 kg)  01/23/19 197 lb 9 oz (89.6 kg)  01/18/19 202 lb (91.6 kg)    Physical Exam Vitals signs and nursing note reviewed.  Constitutional:      Appearance: Normal appearance. He is not ill-appearing.  Neurological:     Mental Status: He is alert.  Psychiatric:        Mood and Affect: Mood normal.        Behavior: Behavior normal.       Results for orders placed or performed in visit on 01/27/19  Lipid panel  Result Value Ref Range   Cholesterol 147 0 - 200 mg/dL   Triglycerides (H) 0.0 - 149.0 mg/dL    453.0 Triglyceride is over 400; calculations on Lipids are invalid.   HDL 37.20 (L)  >39.00 mg/dL   Total CHOL/HDL Ratio 4   LDL cholesterol, direct  Result Value Ref Range   Direct LDL 49.0 mg/dL   Lab Results  Component Value Date   TSH 0.83 01/06/2019    Lab Results  Component Value Date   CHOL 147 01/27/2019   HDL 37.20 (L) 01/27/2019   LDLCALC 123 (H) 03/16/2014   LDLDIRECT 49.0 01/27/2019   TRIG (H) 01/27/2019    453.0 Triglyceride is over 400; calculations on Lipids are invalid.   CHOLHDL 4 01/27/2019    Assessment & Plan:   Problem List Items Addressed This Visit    Nocturia    Endorses x2-3 - ?contributing to insomnia. He is interested in trying flomax - but will start post-surgery. Will trial flomax 0.4mg  next month.       Hypertriglyceridemia - Primary    Compliant with both lipitor, fibrate, and fish oil 2 gm daily. Will update FLP today. The ASCVD Risk score Mikey Bussing DC Jr., et al., 2013) failed to calculate for the following reasons:   The patient has a prior MI or stroke diagnosis       Relevant Orders   Lipid panel (Completed)   Chronic insomnia    States doxepin 25mg  not very effective. Will price out silenor 3mg  dose.  Anticipate alcohol use may be contributing to sleep maintenance insomnia.  ?nocturia contributing. He is interested in trying flomax - but will start post-surgery.           Meds ordered this encounter  Medications  . tamsulosin (FLOMAX) 0.4 MG CAPS capsule    Sig: Take 1 capsule (0.4 mg total) by mouth daily after supper.    Dispense:  30 capsule    Refill:  1   Orders Placed This Encounter  Procedures  . Lipid panel  . LDL cholesterol, direct    Follow up plan: Return for annual exam, prior fasting for blood work.  Ria Bush, MD

## 2019-01-30 ENCOUNTER — Other Ambulatory Visit: Payer: Self-pay | Admitting: Family Medicine

## 2019-01-30 MED ORDER — BUPIVACAINE LIPOSOME 1.3 % IJ SUSP
20.0000 mL | Freq: Once | INTRAMUSCULAR | Status: DC
  Filled 2019-01-30: qty 20

## 2019-01-31 ENCOUNTER — Encounter (HOSPITAL_COMMUNITY): Payer: Self-pay

## 2019-01-31 ENCOUNTER — Ambulatory Visit (HOSPITAL_COMMUNITY): Payer: 59 | Admitting: Certified Registered Nurse Anesthetist

## 2019-01-31 ENCOUNTER — Observation Stay (HOSPITAL_COMMUNITY)
Admission: RE | Admit: 2019-01-31 | Discharge: 2019-02-01 | Disposition: A | Discharge status: Home / Self Care | Payer: 59 | Attending: Orthopedic Surgery | Admitting: Orthopedic Surgery

## 2019-01-31 ENCOUNTER — Encounter (HOSPITAL_COMMUNITY)
Admission: RE | Disposition: A | Discharge status: Home / Self Care | Payer: Self-pay | Source: Home / Self Care | Attending: Orthopedic Surgery

## 2019-01-31 ENCOUNTER — Ambulatory Visit (HOSPITAL_COMMUNITY): Payer: 59 | Admitting: Physician Assistant

## 2019-01-31 ENCOUNTER — Other Ambulatory Visit: Payer: Self-pay

## 2019-01-31 ENCOUNTER — Observation Stay (HOSPITAL_COMMUNITY): Payer: 59

## 2019-01-31 DIAGNOSIS — Z8249 Family history of ischemic heart disease and other diseases of the circulatory system: Secondary | ICD-10-CM | POA: Insufficient documentation

## 2019-01-31 DIAGNOSIS — Z809 Family history of malignant neoplasm, unspecified: Secondary | ICD-10-CM | POA: Insufficient documentation

## 2019-01-31 DIAGNOSIS — Z791 Long term (current) use of non-steroidal anti-inflammatories (NSAID): Secondary | ICD-10-CM | POA: Diagnosis not present

## 2019-01-31 DIAGNOSIS — E781 Pure hyperglyceridemia: Secondary | ICD-10-CM | POA: Diagnosis not present

## 2019-01-31 DIAGNOSIS — Z87891 Personal history of nicotine dependence: Secondary | ICD-10-CM | POA: Diagnosis not present

## 2019-01-31 DIAGNOSIS — F411 Generalized anxiety disorder: Secondary | ICD-10-CM

## 2019-01-31 DIAGNOSIS — Z7289 Other problems related to lifestyle: Secondary | ICD-10-CM

## 2019-01-31 DIAGNOSIS — K219 Gastro-esophageal reflux disease without esophagitis: Secondary | ICD-10-CM | POA: Insufficient documentation

## 2019-01-31 DIAGNOSIS — Z8673 Personal history of transient ischemic attack (TIA), and cerebral infarction without residual deficits: Secondary | ICD-10-CM | POA: Insufficient documentation

## 2019-01-31 DIAGNOSIS — M1120 Other chondrocalcinosis, unspecified site: Secondary | ICD-10-CM | POA: Diagnosis not present

## 2019-01-31 DIAGNOSIS — I700 Atherosclerosis of aorta: Secondary | ICD-10-CM | POA: Diagnosis not present

## 2019-01-31 DIAGNOSIS — Z7982 Long term (current) use of aspirin: Secondary | ICD-10-CM | POA: Diagnosis not present

## 2019-01-31 DIAGNOSIS — Z96659 Presence of unspecified artificial knee joint: Secondary | ICD-10-CM

## 2019-01-31 DIAGNOSIS — Z823 Family history of stroke: Secondary | ICD-10-CM | POA: Diagnosis not present

## 2019-01-31 DIAGNOSIS — I10 Essential (primary) hypertension: Secondary | ICD-10-CM | POA: Insufficient documentation

## 2019-01-31 DIAGNOSIS — G4700 Insomnia, unspecified: Secondary | ICD-10-CM | POA: Insufficient documentation

## 2019-01-31 DIAGNOSIS — F419 Anxiety disorder, unspecified: Secondary | ICD-10-CM | POA: Diagnosis not present

## 2019-01-31 DIAGNOSIS — M1711 Unilateral primary osteoarthritis, right knee: Principal | ICD-10-CM | POA: Insufficient documentation

## 2019-01-31 DIAGNOSIS — E039 Hypothyroidism, unspecified: Secondary | ICD-10-CM | POA: Diagnosis not present

## 2019-01-31 DIAGNOSIS — R269 Unspecified abnormalities of gait and mobility: Secondary | ICD-10-CM | POA: Diagnosis not present

## 2019-01-31 DIAGNOSIS — F1090 Alcohol use, unspecified, uncomplicated: Secondary | ICD-10-CM

## 2019-01-31 DIAGNOSIS — Z79899 Other long term (current) drug therapy: Secondary | ICD-10-CM | POA: Diagnosis not present

## 2019-01-31 DIAGNOSIS — Z808 Family history of malignant neoplasm of other organs or systems: Secondary | ICD-10-CM | POA: Diagnosis not present

## 2019-01-31 DIAGNOSIS — Z789 Other specified health status: Secondary | ICD-10-CM

## 2019-01-31 HISTORY — PX: TOTAL KNEE ARTHROPLASTY: SHX125

## 2019-01-31 SURGERY — ARTHROPLASTY, KNEE, TOTAL
Anesthesia: General | Site: Knee | Laterality: Right

## 2019-01-31 MED ORDER — LACTATED RINGERS IV SOLN
INTRAVENOUS | Status: DC
  Administered 2019-01-31: 14:00:00 via INTRAVENOUS

## 2019-01-31 MED ORDER — FENTANYL CITRATE (PF) 250 MCG/5ML IJ SOLN
INTRAMUSCULAR | Status: DC | PRN
  Administered 2019-01-31 (×7): 50 ug via INTRAVENOUS

## 2019-01-31 MED ORDER — ROCURONIUM BROMIDE 10 MG/ML (PF) SYRINGE
PREFILLED_SYRINGE | INTRAVENOUS | Status: AC
  Filled 2019-01-31: qty 10

## 2019-01-31 MED ORDER — ACETAMINOPHEN 500 MG PO TABS: 1000.0000 mg | ORAL_TABLET | Freq: Three times a day (TID) | ORAL | 0 refills | Status: AC

## 2019-01-31 MED ORDER — ROCURONIUM BROMIDE 10 MG/ML (PF) SYRINGE
PREFILLED_SYRINGE | INTRAVENOUS | Status: DC | PRN
  Administered 2019-01-31: 40 mg via INTRAVENOUS
  Administered 2019-01-31: 20 mg via INTRAVENOUS

## 2019-01-31 MED ORDER — SUGAMMADEX SODIUM 200 MG/2ML IV SOLN
INTRAVENOUS | Status: DC | PRN
  Administered 2019-01-31: 200 mg via INTRAVENOUS

## 2019-01-31 MED ORDER — DOCUSATE SODIUM 100 MG PO CAPS
100.0000 mg | ORAL_CAPSULE | Freq: Two times a day (BID) | ORAL | Status: DC
  Administered 2019-01-31 – 2019-02-01 (×2): 100 mg via ORAL
  Filled 2019-01-31 (×2): qty 1

## 2019-01-31 MED ORDER — FENTANYL CITRATE (PF) 100 MCG/2ML IJ SOLN
INTRAMUSCULAR | Status: AC
  Filled 2019-01-31: qty 2

## 2019-01-31 MED ORDER — OXYCODONE HCL 5 MG PO TABS: 5.0000 mg | ORAL_TABLET | Freq: Once | ORAL | Status: DC | PRN

## 2019-01-31 MED ORDER — POLYETHYLENE GLYCOL 3350 17 G PO PACK: 17.0000 g | PACK | Freq: Every day | ORAL | Status: DC | PRN

## 2019-01-31 MED ORDER — LORAZEPAM 2 MG/ML IJ SOLN: 1.0000 mg | Freq: Four times a day (QID) | INTRAMUSCULAR | Status: DC | PRN

## 2019-01-31 MED ORDER — LIDOCAINE 2% (20 MG/ML) 5 ML SYRINGE
INTRAMUSCULAR | Status: AC
  Filled 2019-01-31: qty 5

## 2019-01-31 MED ORDER — MIDAZOLAM HCL 5 MG/5ML IJ SOLN
INTRAMUSCULAR | Status: DC | PRN
  Administered 2019-01-31: 2 mg via INTRAVENOUS

## 2019-01-31 MED ORDER — PROPOFOL 10 MG/ML IV BOLUS
INTRAVENOUS | Status: AC
  Filled 2019-01-31: qty 20

## 2019-01-31 MED ORDER — OXYCODONE HCL 5 MG PO TABS
5.0000 mg | ORAL_TABLET | ORAL | Status: DC | PRN
  Administered 2019-01-31 – 2019-02-01 (×5): 10 mg via ORAL
  Filled 2019-01-31 (×5): qty 2

## 2019-01-31 MED ORDER — ASPIRIN 81 MG PO CHEW
81.0000 mg | CHEWABLE_TABLET | Freq: Two times a day (BID) | ORAL | Status: DC
  Administered 2019-01-31 – 2019-02-01 (×2): 81 mg via ORAL
  Filled 2019-01-31 (×2): qty 1

## 2019-01-31 MED ORDER — BUPIVACAINE LIPOSOME 1.3 % IJ SUSP
INTRAMUSCULAR | Status: DC | PRN
  Administered 2019-01-31: 20 mL

## 2019-01-31 MED ORDER — ONDANSETRON HCL 4 MG PO TABS: 4.0000 mg | ORAL_TABLET | Freq: Three times a day (TID) | ORAL | 0 refills | Status: DC | PRN

## 2019-01-31 MED ORDER — MIDAZOLAM HCL 2 MG/2ML IJ SOLN
1.0000 mg | INTRAMUSCULAR | Status: DC
  Administered 2019-01-31: 2 mg via INTRAVENOUS
  Filled 2019-01-31: qty 2

## 2019-01-31 MED ORDER — ONDANSETRON HCL 4 MG/2ML IJ SOLN
INTRAMUSCULAR | Status: DC | PRN
  Administered 2019-01-31: 4 mg via INTRAVENOUS

## 2019-01-31 MED ORDER — ACETAMINOPHEN 500 MG PO TABS
1000.0000 mg | ORAL_TABLET | Freq: Three times a day (TID) | ORAL | Status: DC
  Administered 2019-01-31 – 2019-02-01 (×3): 1000 mg via ORAL
  Filled 2019-01-31 (×3): qty 2

## 2019-01-31 MED ORDER — SODIUM FLUORIDE 1.1 % DT GEL: 1.0000 "application " | Freq: Every day | DENTAL | Status: DC

## 2019-01-31 MED ORDER — FENOFIBRATE 160 MG PO TABS
160.0000 mg | ORAL_TABLET | Freq: Every day | ORAL | Status: DC
  Administered 2019-01-31 – 2019-02-01 (×2): 160 mg via ORAL
  Filled 2019-01-31 (×2): qty 1

## 2019-01-31 MED ORDER — POVIDONE-IODINE 10 % EX SWAB: 2.0000 "application " | Freq: Once | CUTANEOUS | Status: DC

## 2019-01-31 MED ORDER — PHENOL 1.4 % MT LIQD
1.0000 | OROMUCOSAL | Status: DC | PRN
  Filled 2019-01-31: qty 177

## 2019-01-31 MED ORDER — TRANEXAMIC ACID-NACL 1000-0.7 MG/100ML-% IV SOLN
1000.0000 mg | INTRAVENOUS | Status: AC
  Administered 2019-01-31: 1000 mg via INTRAVENOUS
  Filled 2019-01-31: qty 100

## 2019-01-31 MED ORDER — METHOCARBAMOL 500 MG IVPB - SIMPLE MED
500.0000 mg | Freq: Four times a day (QID) | INTRAVENOUS | Status: DC | PRN
  Administered 2019-01-31: 500 mg via INTRAVENOUS
  Filled 2019-01-31: qty 50

## 2019-01-31 MED ORDER — CELECOXIB 200 MG PO CAPS
200.0000 mg | ORAL_CAPSULE | Freq: Two times a day (BID) | ORAL | Status: DC
  Administered 2019-01-31 – 2019-02-01 (×2): 200 mg via ORAL
  Filled 2019-01-31 (×2): qty 1

## 2019-01-31 MED ORDER — DIPHENHYDRAMINE HCL 12.5 MG/5ML PO ELIX: 12.5000 mg | ORAL_SOLUTION | ORAL | Status: DC | PRN

## 2019-01-31 MED ORDER — GABAPENTIN 300 MG PO CAPS: 300.0000 mg | ORAL_CAPSULE | Freq: Two times a day (BID) | ORAL | 0 refills | Status: DC

## 2019-01-31 MED ORDER — ATORVASTATIN CALCIUM 40 MG PO TABS
40.0000 mg | ORAL_TABLET | Freq: Every day | ORAL | Status: DC
  Administered 2019-02-01: 40 mg via ORAL
  Filled 2019-01-31: qty 1

## 2019-01-31 MED ORDER — ADULT MULTIVITAMIN W/MINERALS CH
1.0000 | ORAL_TABLET | Freq: Every day | ORAL | Status: DC
  Administered 2019-01-31 – 2019-02-01 (×2): 1 via ORAL
  Filled 2019-01-31 (×2): qty 1

## 2019-01-31 MED ORDER — METHOCARBAMOL 500 MG PO TABS
500.0000 mg | ORAL_TABLET | Freq: Four times a day (QID) | ORAL | Status: DC | PRN
  Administered 2019-01-31 – 2019-02-01 (×2): 500 mg via ORAL
  Filled 2019-01-31 (×2): qty 1

## 2019-01-31 MED ORDER — HYDROMORPHONE HCL 1 MG/ML IJ SOLN
INTRAMUSCULAR | Status: AC
  Filled 2019-01-31: qty 1

## 2019-01-31 MED ORDER — CELECOXIB 200 MG PO CAPS: 200.0000 mg | ORAL_CAPSULE | Freq: Two times a day (BID) | ORAL | 0 refills | Status: AC

## 2019-01-31 MED ORDER — METOCLOPRAMIDE HCL 5 MG/ML IJ SOLN: 5.0000 mg | Freq: Three times a day (TID) | INTRAMUSCULAR | Status: DC | PRN

## 2019-01-31 MED ORDER — TAMSULOSIN HCL 0.4 MG PO CAPS
0.4000 mg | ORAL_CAPSULE | Freq: Every day | ORAL | Status: DC
  Administered 2019-01-31: 0.4 mg via ORAL
  Filled 2019-01-31: qty 1

## 2019-01-31 MED ORDER — SODIUM CHLORIDE 0.9 % IR SOLN
Status: DC | PRN
  Administered 2019-01-31: 1000 mL

## 2019-01-31 MED ORDER — SORBITOL 70 % SOLN
30.0000 mL | Freq: Every day | Status: DC | PRN
  Filled 2019-01-31: qty 30

## 2019-01-31 MED ORDER — CEFAZOLIN SODIUM-DEXTROSE 2-4 GM/100ML-% IV SOLN
2.0000 g | INTRAVENOUS | Status: AC
  Administered 2019-01-31: 2 g via INTRAVENOUS
  Filled 2019-01-31: qty 100

## 2019-01-31 MED ORDER — SUCCINYLCHOLINE CHLORIDE 200 MG/10ML IV SOSY
PREFILLED_SYRINGE | INTRAVENOUS | Status: AC
  Filled 2019-01-31: qty 10

## 2019-01-31 MED ORDER — SODIUM CHLORIDE 0.9 % IV SOLN
INTRAVENOUS | Status: DC | PRN
  Administered 2019-01-31: 50 ug/min via INTRAVENOUS

## 2019-01-31 MED ORDER — PROMETHAZINE HCL 25 MG/ML IJ SOLN: 6.2500 mg | INTRAMUSCULAR | Status: DC | PRN

## 2019-01-31 MED ORDER — SODIUM CHLORIDE 0.9% FLUSH
INTRAVENOUS | Status: DC | PRN
  Administered 2019-01-31: 30 mL

## 2019-01-31 MED ORDER — METOPROLOL TARTRATE 50 MG PO TABS
100.0000 mg | ORAL_TABLET | Freq: Two times a day (BID) | ORAL | Status: DC
  Administered 2019-01-31 – 2019-02-01 (×2): 100 mg via ORAL
  Filled 2019-01-31 (×2): qty 2

## 2019-01-31 MED ORDER — METOCLOPRAMIDE HCL 5 MG PO TABS: 5.0000 mg | ORAL_TABLET | Freq: Three times a day (TID) | ORAL | Status: DC | PRN

## 2019-01-31 MED ORDER — BUPIVACAINE-EPINEPHRINE (PF) 0.5% -1:200000 IJ SOLN
INTRAMUSCULAR | Status: DC | PRN
  Administered 2019-01-31: 30 mL via PERINEURAL

## 2019-01-31 MED ORDER — ONDANSETRON HCL 4 MG PO TABS: 4.0000 mg | ORAL_TABLET | Freq: Four times a day (QID) | ORAL | Status: DC | PRN

## 2019-01-31 MED ORDER — LEVOTHYROXINE SODIUM 75 MCG PO TABS
150.0000 ug | ORAL_TABLET | Freq: Every day | ORAL | Status: DC
  Administered 2019-02-01: 150 ug via ORAL
  Filled 2019-01-31: qty 2

## 2019-01-31 MED ORDER — GABAPENTIN 300 MG PO CAPS
300.0000 mg | ORAL_CAPSULE | Freq: Three times a day (TID) | ORAL | Status: DC
  Administered 2019-01-31 – 2019-02-01 (×3): 300 mg via ORAL
  Filled 2019-01-31 (×3): qty 1

## 2019-01-31 MED ORDER — ACETAMINOPHEN 325 MG PO TABS: 325.0000 mg | ORAL_TABLET | Freq: Four times a day (QID) | ORAL | Status: DC | PRN

## 2019-01-31 MED ORDER — DOXEPIN HCL 25 MG PO CAPS
25.0000 mg | ORAL_CAPSULE | Freq: Every day | ORAL | Status: DC
  Administered 2019-01-31: 25 mg via ORAL
  Filled 2019-01-31 (×2): qty 1

## 2019-01-31 MED ORDER — SUCCINYLCHOLINE CHLORIDE 200 MG/10ML IV SOSY
PREFILLED_SYRINGE | INTRAVENOUS | Status: DC | PRN
  Administered 2019-01-31: 120 mg via INTRAVENOUS

## 2019-01-31 MED ORDER — CEFAZOLIN SODIUM-DEXTROSE 1-4 GM/50ML-% IV SOLN
1.0000 g | Freq: Four times a day (QID) | INTRAVENOUS | Status: AC
  Administered 2019-01-31 (×2): 1 g via INTRAVENOUS
  Filled 2019-01-31 (×2): qty 50

## 2019-01-31 MED ORDER — 0.9 % SODIUM CHLORIDE (POUR BTL) OPTIME
TOPICAL | Status: DC | PRN
  Administered 2019-01-31: 1000 mL

## 2019-01-31 MED ORDER — SODIUM CHLORIDE (PF) 0.9 % IJ SOLN
INTRAMUSCULAR | Status: AC
  Filled 2019-01-31: qty 30

## 2019-01-31 MED ORDER — DEXAMETHASONE SODIUM PHOSPHATE 10 MG/ML IJ SOLN
10.0000 mg | Freq: Once | INTRAMUSCULAR | Status: AC
  Administered 2019-02-01: 10 mg via INTRAVENOUS
  Filled 2019-01-31: qty 1

## 2019-01-31 MED ORDER — CHLORHEXIDINE GLUCONATE 4 % EX LIQD: 60.0000 mL | Freq: Once | CUTANEOUS | Status: DC

## 2019-01-31 MED ORDER — FOLIC ACID 1 MG PO TABS
1.0000 mg | ORAL_TABLET | Freq: Every day | ORAL | Status: DC
  Administered 2019-01-31 – 2019-02-01 (×2): 1 mg via ORAL
  Filled 2019-01-31 (×2): qty 1

## 2019-01-31 MED ORDER — OXYCODONE HCL 5 MG/5ML PO SOLN: 5.0000 mg | Freq: Once | ORAL | Status: DC | PRN

## 2019-01-31 MED ORDER — ONDANSETRON HCL 4 MG/2ML IJ SOLN
INTRAMUSCULAR | Status: AC
  Filled 2019-01-31: qty 2

## 2019-01-31 MED ORDER — HYDROMORPHONE HCL 1 MG/ML IJ SOLN: 0.5000 mg | INTRAMUSCULAR | Status: DC | PRN

## 2019-01-31 MED ORDER — MEPERIDINE HCL 50 MG/ML IJ SOLN: 6.2500 mg | INTRAMUSCULAR | Status: DC | PRN

## 2019-01-31 MED ORDER — VITAMIN B-1 100 MG PO TABS
100.0000 mg | ORAL_TABLET | Freq: Every day | ORAL | Status: DC
  Administered 2019-01-31 – 2019-02-01 (×2): 100 mg via ORAL
  Filled 2019-01-31 (×2): qty 1

## 2019-01-31 MED ORDER — THIAMINE HCL 100 MG/ML IJ SOLN
100.0000 mg | Freq: Every day | INTRAMUSCULAR | Status: DC
  Filled 2019-01-31 (×2): qty 1

## 2019-01-31 MED ORDER — LACTATED RINGERS IV SOLN
INTRAVENOUS | Status: DC
  Administered 2019-01-31 (×2): via INTRAVENOUS

## 2019-01-31 MED ORDER — AMLODIPINE BESYLATE 10 MG PO TABS
10.0000 mg | ORAL_TABLET | Freq: Every day | ORAL | Status: DC
  Administered 2019-02-01: 10 mg via ORAL
  Filled 2019-01-31: qty 1

## 2019-01-31 MED ORDER — FENTANYL CITRATE (PF) 250 MCG/5ML IJ SOLN
INTRAMUSCULAR | Status: AC
  Filled 2019-01-31: qty 5

## 2019-01-31 MED ORDER — METHOCARBAMOL 500 MG PO TABS: 500.0000 mg | ORAL_TABLET | Freq: Three times a day (TID) | ORAL | 0 refills | Status: DC | PRN

## 2019-01-31 MED ORDER — MENTHOL 3 MG MT LOZG: 1.0000 | LOZENGE | OROMUCOSAL | Status: DC | PRN

## 2019-01-31 MED ORDER — CELECOXIB 200 MG PO CAPS
400.0000 mg | ORAL_CAPSULE | Freq: Once | ORAL | Status: AC
  Administered 2019-01-31: 400 mg via ORAL
  Filled 2019-01-31: qty 2

## 2019-01-31 MED ORDER — ACETAMINOPHEN 500 MG PO TABS
1000.0000 mg | ORAL_TABLET | Freq: Once | ORAL | Status: AC
  Administered 2019-01-31: 1000 mg via ORAL
  Filled 2019-01-31: qty 2

## 2019-01-31 MED ORDER — PANTOPRAZOLE SODIUM 40 MG PO TBEC
40.0000 mg | DELAYED_RELEASE_TABLET | Freq: Every day | ORAL | Status: DC
  Administered 2019-02-01: 40 mg via ORAL
  Filled 2019-01-31: qty 1

## 2019-01-31 MED ORDER — ASPIRIN EC 81 MG PO TBEC: 81.0000 mg | DELAYED_RELEASE_TABLET | Freq: Two times a day (BID) | ORAL | 0 refills | Status: DC

## 2019-01-31 MED ORDER — METHOCARBAMOL 500 MG IVPB - SIMPLE MED
INTRAVENOUS | Status: AC
  Filled 2019-01-31: qty 50

## 2019-01-31 MED ORDER — FENTANYL CITRATE (PF) 100 MCG/2ML IJ SOLN
50.0000 ug | INTRAMUSCULAR | Status: DC
  Administered 2019-01-31: 50 ug via INTRAVENOUS
  Filled 2019-01-31: qty 2

## 2019-01-31 MED ORDER — HYDROMORPHONE HCL 1 MG/ML IJ SOLN
0.2500 mg | INTRAMUSCULAR | Status: DC | PRN
  Administered 2019-01-31 (×4): 0.5 mg via INTRAVENOUS

## 2019-01-31 MED ORDER — DEXAMETHASONE SODIUM PHOSPHATE 10 MG/ML IJ SOLN
INTRAMUSCULAR | Status: AC
  Filled 2019-01-31: qty 1

## 2019-01-31 MED ORDER — CLONIDINE HCL (ANALGESIA) 100 MCG/ML EP SOLN
EPIDURAL | Status: DC | PRN
  Administered 2019-01-31: 80 ug

## 2019-01-31 MED ORDER — PROPOFOL 10 MG/ML IV BOLUS
INTRAVENOUS | Status: DC | PRN
  Administered 2019-01-31: 150 mg via INTRAVENOUS

## 2019-01-31 MED ORDER — MAGNESIUM CITRATE PO SOLN: 1.0000 | Freq: Once | ORAL | Status: DC | PRN

## 2019-01-31 MED ORDER — DEXAMETHASONE SODIUM PHOSPHATE 10 MG/ML IJ SOLN
INTRAMUSCULAR | Status: DC | PRN
  Administered 2019-01-31: 10 mg via INTRAVENOUS

## 2019-01-31 MED ORDER — ONDANSETRON HCL 4 MG/2ML IJ SOLN: 4.0000 mg | Freq: Four times a day (QID) | INTRAMUSCULAR | Status: DC | PRN

## 2019-01-31 MED ORDER — LORAZEPAM 1 MG PO TABS: 1.0000 mg | ORAL_TABLET | Freq: Four times a day (QID) | ORAL | Status: DC | PRN

## 2019-01-31 MED ORDER — DOCUSATE SODIUM 100 MG PO CAPS: 100.0000 mg | ORAL_CAPSULE | Freq: Two times a day (BID) | ORAL | 0 refills | Status: DC

## 2019-01-31 MED ORDER — MIDAZOLAM HCL 2 MG/2ML IJ SOLN
INTRAMUSCULAR | Status: AC
  Filled 2019-01-31: qty 2

## 2019-01-31 MED ORDER — OXYCODONE HCL 5 MG PO TABS: 5.0000 mg | ORAL_TABLET | ORAL | 0 refills | Status: AC | PRN

## 2019-01-31 SURGICAL SUPPLY — 47 items
BEARIN INSERT TIBIAL SZ6 9 (Orthopedic Implant) ×2 IMPLANT
BEARING INSERT TIBIAL SZ6 9 (Orthopedic Implant) IMPLANT
BLADE HEX COATED 2.75 (ELECTRODE) ×2 IMPLANT
BLADE SAG 18X100X1.27 (BLADE) ×2 IMPLANT
BLADE SAGITTAL 25.0X1.37X90 (BLADE) ×2 IMPLANT
BLADE SURG 15 STRL LF DISP TIS (BLADE) ×1 IMPLANT
BLADE SURG 15 STRL SS (BLADE) ×1
BLADE SURG SZ10 CARB STEEL (BLADE) ×4 IMPLANT
BNDG ELASTIC 6X10 VLCR STRL LF (GAUZE/BANDAGES/DRESSINGS) ×2 IMPLANT
BOWL SMART MIX CTS (DISPOSABLE) IMPLANT
CHLORAPREP W/TINT 26 (MISCELLANEOUS) ×4 IMPLANT
CLSR STERI-STRIP ANTIMIC 1/2X4 (GAUZE/BANDAGES/DRESSINGS) ×2 IMPLANT
COVER SURGICAL LIGHT HANDLE (MISCELLANEOUS) ×2 IMPLANT
COVER WAND RF STERILE (DRAPES) IMPLANT
CUFF TOURN SGL QUICK 34 (TOURNIQUET CUFF) ×1
CUFF TRNQT CYL 34X4.125X (TOURNIQUET CUFF) ×1 IMPLANT
DECANTER SPIKE VIAL GLASS SM (MISCELLANEOUS) IMPLANT
DRAPE U-SHAPE 47X51 STRL (DRAPES) ×2 IMPLANT
DRSG MEPILEX BORDER 4X12 (GAUZE/BANDAGES/DRESSINGS) ×2 IMPLANT
FEMORAL POSTERIOR SZ5 RT (Femur) IMPLANT
GLOVE BIO SURGEON STRL SZ7.5 (GLOVE) ×4 IMPLANT
GLOVE BIOGEL PI IND STRL 8 (GLOVE) ×2 IMPLANT
GLOVE BIOGEL PI INDICATOR 8 (GLOVE) ×2
GOWN STRL REUS W/ TWL LRG LVL3 (GOWN DISPOSABLE) ×2 IMPLANT
GOWN STRL REUS W/TWL LRG LVL3 (GOWN DISPOSABLE) ×2
HANDPIECE INTERPULSE COAX TIP (DISPOSABLE) ×1
HOLDER FOLEY CATH W/STRAP (MISCELLANEOUS) IMPLANT
IMMOBILIZER KNEE 22 UNIV (SOFTGOODS) ×3 IMPLANT
KIT TURNOVER KIT A (KITS) IMPLANT
KNEE PATELLA ASYMMETRIC 10X32 (Knees) ×1 IMPLANT
KNEE TIBIAL COMPONENT SZ6 (Knees) ×1 IMPLANT
MANIFOLD NEPTUNE II (INSTRUMENTS) ×2 IMPLANT
NS IRRIG 1000ML POUR BTL (IV SOLUTION) ×2 IMPLANT
PACK TOTAL KNEE CUSTOM (KITS) ×2 IMPLANT
PIN FLUTED HEDLESS FIX 3.5X1/8 (PIN) ×1 IMPLANT
POSTERIOR FEMORAL SZ5 RT (Femur) ×2 IMPLANT
PROTECTOR NERVE ULNAR (MISCELLANEOUS) ×2 IMPLANT
SET HNDPC FAN SPRY TIP SCT (DISPOSABLE) ×1 IMPLANT
SUT MNCRL AB 4-0 PS2 18 (SUTURE) ×2 IMPLANT
SUT VIC AB 0 CT1 36 (SUTURE) ×3 IMPLANT
SUT VIC AB 1 CT1 36 (SUTURE) ×4 IMPLANT
SUT VIC AB 2-0 CT1 27 (SUTURE) ×1
SUT VIC AB 2-0 CT1 TAPERPNT 27 (SUTURE) ×1 IMPLANT
TRAY FOLEY MTR SLVR 14FR STAT (SET/KITS/TRAYS/PACK) IMPLANT
TRAY FOLEY MTR SLVR 16FR STAT (SET/KITS/TRAYS/PACK) ×2 IMPLANT
WRAP KNEE MAXI GEL POST OP (GAUZE/BANDAGES/DRESSINGS) ×1 IMPLANT
YANKAUER SUCT BULB TIP 10FT TU (MISCELLANEOUS) ×2 IMPLANT

## 2019-01-31 NOTE — Anesthesia Procedure Notes (Signed)
Anesthesia Regional Block: Adductor canal block   Pre-Anesthetic Checklist: ,, timeout performed, Correct Patient, Correct Site, Correct Laterality, Correct Procedure, Correct Position, site marked, Risks and benefits discussed,  Surgical consent,  Pre-op evaluation,  At surgeon's request and post-op pain management  Laterality: Right  Prep: chloraprep       Needles:  Injection technique: Single-shot  Needle Type: Stimiplex     Needle Length: 9cm  Needle Gauge: 21     Additional Needles:   Procedures:,,,, ultrasound used (permanent image in chart),,,,  Narrative:  Start time: 01/31/2019 8:55 AM End time: 01/31/2019 9:00 AM Injection made incrementally with aspirations every 5 mL.  Performed by: Personally  Anesthesiologist: Nolon Nations, MD  Additional Notes: BP cuff, EKG monitors applied. Sedation begun. Artery and nerve location verified with U/S and anesthetic injected incrementally, slowly, and after negative aspirations under direct u/s guidance. Good fascial /perineural spread. Tolerated well.

## 2019-01-31 NOTE — Anesthesia Procedure Notes (Signed)
Procedure Name: Intubation Date/Time: 01/31/2019 10:08 AM Performed by: Mitzie Na, CRNA Pre-anesthesia Checklist: Patient identified, Emergency Drugs available, Suction available and Patient being monitored Patient Re-evaluated:Patient Re-evaluated prior to induction Oxygen Delivery Method: Circle system utilized Preoxygenation: Pre-oxygenation with 100% oxygen Induction Type: IV induction and Rapid sequence Laryngoscope Size: Mac and 4 Grade View: Grade II Tube type: Oral Tube size: 7.5 mm Number of attempts: 1 Airway Equipment and Method: Stylet and Oral airway Placement Confirmation: ETT inserted through vocal cords under direct vision,  positive ETCO2 and breath sounds checked- equal and bilateral Secured at: 24 cm Tube secured with: Tape Dental Injury: Teeth and Oropharynx as per pre-operative assessment

## 2019-01-31 NOTE — Discharge Instructions (Signed)

## 2019-01-31 NOTE — Evaluation (Signed)
Physical Therapy Evaluation Patient Details Name: Patrick Bailey. MRN: [id number] DOB: 02/02/1961 Today's Date: 01/31/2019   History of Present Illness  58 yo male s/p R TKR on 01/31/19. PMH includes cerebellar CVA 2017, OA, HTN, throat cancer, L TKR.  Clinical Impression  Pt presents with R knee pain, decreased R knee ROM, increased time and effort to perform mobility tasks, and decreased activity tolerance post-surgically. Pt to benefit from acute PT to address deficits. Pt ambulated hallway distance with RW with min guard assist, verbal cuing for form and safety provided. Pt educated on ankle pumps (20/hour) to perform this afternoon/evening to increase circulation, to pt's tolerance and limited by pain. PT to progress mobility as tolerated, and will continue to follow acutely.        Follow Up Recommendations Follow surgeon's recommendation for DC plan and follow-up therapies;Supervision for mobility/OOB    Equipment Recommendations  None recommended by PT    Recommendations for Other Services       Precautions / Restrictions Precautions Precautions: Fall Restrictions Weight Bearing Restrictions: No Other Position/Activity Restrictions: WBAT      Mobility  Bed Mobility Overal bed mobility: Needs Assistance Bed Mobility: Supine to Sit     Supine to sit: HOB elevated;Min guard     General bed mobility comments: Min guard for safety, increased time and effort.  Transfers Overall transfer level: Needs assistance Equipment used: Rolling walker (2 wheeled) Transfers: Sit to/from Stand Sit to Stand: Min guard;From elevated surface         General transfer comment: Min guard for safety, verbal cuing for hand placement on RW and bed when rising.  Ambulation/Gait Ambulation/Gait assistance: Min guard Gait Distance (Feet): 90 Feet Assistive device: Rolling walker (2 wheeled) Gait Pattern/deviations: Step-to pattern;Decreased step length - left;Decreased step length  - right;Trunk flexed     General Gait Details: Min guard for safety, verbal cuing for placement in RW, sequencing. Pt walks with slight limp, suspect antalgic gait vs LLD.  Stairs            Wheelchair Mobility    Modified Rankin (Stroke Patients Only)       Balance Overall balance assessment: Mild deficits observed, not formally tested                                           Pertinent Vitals/Pain Pain Assessment: 0-10 Pain Score: 3  Pain Location: R knee Pain Descriptors / Indicators: Sore;Aching Pain Intervention(s): Repositioned;Premedicated before session;Monitored during session;Limited activity within patient's tolerance;Ice applied    Home Living Family/patient expects to be discharged to:: Private residence Living Arrangements: Alone Available Help at Discharge: Family;Available PRN/intermittently(dad lives closeby) Type of Home: Mobile home Home Access: Stairs to enter Entrance Stairs-Rails: Right Entrance Stairs-Number of Steps: 2 Home Layout: One level Home Equipment: Environmental consultant - 2 wheels;Crutches Additional Comments: pt states "I don't need a potty chair"    Prior Function Level of Independence: Independent         Comments: Pt is a pipe fitter and welder     Hand Dominance   Dominant Hand: Right    Extremity/Trunk Assessment   Upper Extremity Assessment Upper Extremity Assessment: Overall WFL for tasks assessed    Lower Extremity Assessment Lower Extremity Assessment: Overall WFL for tasks assessed;RLE deficits/detail RLE Deficits / Details: suspected post-surgical weakness; able to perform ankle pumps, quad set, heel slide, SLR  without lift assist or quad lag RLE Sensation: WNL    Cervical / Trunk Assessment Cervical / Trunk Assessment: Normal  Communication   Communication: No difficulties  Cognition Arousal/Alertness: Awake/alert Behavior During Therapy: WFL for tasks assessed/performed Overall Cognitive  Status: Within Functional Limits for tasks assessed                                        General Comments      Exercises Total Joint Exercises Goniometric ROM: knee aarom ~5-90*, limited by pain   Assessment/Plan    PT Assessment Patient needs continued PT services  PT Problem List Decreased strength;Decreased range of motion;Decreased activity tolerance;Decreased balance;Pain;Decreased knowledge of use of DME;Decreased mobility       PT Treatment Interventions DME instruction;Therapeutic activities;Gait training;Therapeutic exercise;Patient/family education;Balance training;Stair training;Functional mobility training    PT Goals (Current goals can be found in the Care Plan section)  Acute Rehab PT Goals Patient Stated Goal: go home PT Goal Formulation: With patient Time For Goal Achievement: 02/07/19 Potential to Achieve Goals: Good    Frequency 7X/week   Barriers to discharge        Co-evaluation               AM-PAC PT "6 Clicks" Mobility  Outcome Measure Help needed turning from your back to your side while in a flat bed without using bedrails?: A Little Help needed moving from lying on your back to sitting on the side of a flat bed without using bedrails?: A Little Help needed moving to and from a bed to a chair (including a wheelchair)?: A Little Help needed standing up from a chair using your arms (e.g., wheelchair or bedside chair)?: A Little Help needed to walk in hospital room?: A Little Help needed climbing 3-5 steps with a railing? : A Little 6 Click Score: 18    End of Session Equipment Utilized During Treatment: Gait belt Activity Tolerance: Patient tolerated treatment well Patient left: in chair;with chair alarm set;with SCD's reapplied;with call bell/phone within reach Nurse Communication: Mobility status PT Visit Diagnosis: Difficulty in walking, not elsewhere classified (R26.2);Other abnormalities of gait and mobility  (R26.89)    Time: 1601-0932 PT Time Calculation (min) (ACUTE ONLY): 20 min   Charges:   PT Evaluation $PT Eval Low Complexity: 1 Low         Haelee Bolen Conception Chancy, PT Acute Rehabilitation Services Pager [phone]  Office [phone]   Rhyker Silversmith D Elonda Husky 01/31/2019, 4:54 PM

## 2019-01-31 NOTE — Interval H&P Note (Signed)
I participated in the care of this patient and agree with the above history, physical and evaluation. I performed a review of the history and a physical exam as detailed   Timothy Daniel Murphy MD  

## 2019-01-31 NOTE — Anesthesia Postprocedure Evaluation (Signed)
Anesthesia Post Note  Patient: Patrick Bailey.  Procedure(s) Performed: TOTAL KNEE ARTHROPLASTY (Right Knee)     Patient location during evaluation: PACU Anesthesia Type: General Level of consciousness: awake and alert Pain management: pain level controlled Vital Signs Assessment: post-procedure vital signs reviewed and stable Respiratory status: spontaneous breathing, nonlabored ventilation, respiratory function stable and patient connected to nasal cannula oxygen Cardiovascular status: blood pressure returned to baseline and stable Postop Assessment: no apparent nausea or vomiting Anesthetic complications: no    Last Vitals:  Vitals:   01/31/19 1300 01/31/19 1317  BP: (!) 136/96 (!) 137/99  Pulse: 84 84  Resp: 12 14  Temp: 36.5 C 36.7 C  SpO2: 98% 97%    Last Pain:  Vitals:   01/31/19 1317  TempSrc: Oral  PainSc: 3                  Alma Mohiuddin

## 2019-01-31 NOTE — Progress Notes (Signed)
Assisted Dr. Germeroth with right, ultrasound guided, adductor canal block. Side rails up, monitors on throughout procedure. See vital signs in flow sheet. Tolerated Procedure well. 

## 2019-01-31 NOTE — Op Note (Signed)
DATE OF SURGERY:  01/31/2019 TIME: 9:53 AM  PATIENT NAME:  Patrick Bailey.   AGE: 58 y.o.    PRE-OPERATIVE DIAGNOSIS:  OA RIGHT KNEE  POST-OPERATIVE DIAGNOSIS:  Same  PROCEDURE:  Procedure(s): TOTAL KNEE ARTHROPLASTY   SURGEON:  Renette Butters, MD   ASSISTANT:  Roxan Hockey, PA-C, he was present and scrubbed throughout the case, critical for completion in a timely fashion, and for retraction, instrumentation, and closure.    OPERATIVE IMPLANTS: [employer]ker Triathlon Posterior Stabilized. Press fit knee  Femur size 5, Tibia size 6, Patella size 32 3-peg oval button, with a 9 mm polyethylene insert.   PREOPERATIVE INDICATIONS:  Patrick Bailey. is a 58 y.o. year old male with end stage bone on bone degenerative arthritis of the knee who failed conservative treatment, including injections, antiinflammatories, activity modification, and assistive devices, and had significant impairment of their activities of daily living, and elected for Total Knee Arthroplasty.   The risks, benefits, and alternatives were discussed at length including but not limited to the risks of infection, bleeding, nerve injury, stiffness, blood clots, the need for revision surgery, cardiopulmonary complications, among others, and they were willing to proceed.   OPERATIVE DESCRIPTION:  The patient was brought to the operative room and placed in a supine position.  General anesthesia was administered.  IV antibiotics were given.  The lower extremity was prepped and draped in the usual sterile fashion.  Time out was performed.  The leg was elevated and exsanguinated and the tourniquet was inflated.  Anterior approach was performed.  The patella was everted and osteophytes were removed.  The anterior horn of the medial and lateral meniscus was removed.   The distal femur was opened with the drill and the intramedullary distal femoral cutting jig was utilized, set at 5 degrees resecting 8 mm off the distal  femur.  Care was taken to protect the collateral ligaments.  The distal femoral sizing jig was applied, taking care to avoid notching.  Then the 4-in-1 cutting jig was applied and the anterior and posterior femur was cut, along with the chamfer cuts.  All posterior osteophytes were removed.  The flexion gap was then measured and was symmetric with the extension gap.  Then the extramedullary tibial cutting jig was utilized making the appropriate cut using the anterior tibial crest as a reference building in appropriate posterior slope.  Care was taken during the cut to protect the medial and collateral ligaments.  The proximal tibia was removed along with the posterior horns of the menisci.  The PCL was sacrificed.    The extensor gap was measured and was approximately 25mm.    I completed the distal femoral preparation using the appropriate jig to prepare the box.  The patella was then measured, and cut with the saw.    The proximal tibia sized and prepared accordingly with the reamer and the punch, and then all components were trialed with the above sized poly insert.  The knee was found to have excellent balance and full motion.    The above named components were then impacted into place and Poly tibial piece and patella were inserted.  I was very happy with his stability and ROM  I performed a periarticular injection with marcaine and toradol  The knee was easily taken through a range of motion and the patella tracked well and the knee irrigated copiously and the parapatellar and subcutaneous tissue closed with vicryl, and monocryl with steri strips for the  skin.  The incision was dressed with sterile gauze and the tourniquet released and the patient was awakened and returned to the PACU in stable and satisfactory condition.  There were no complications.  Total tourniquet time was roughly 75 minutes.   POSTOPERATIVE PLAN: post op Abx, DVT px: SCD's, TED's, Early ambulation and chemical  px

## 2019-01-31 NOTE — Anesthesia Preprocedure Evaluation (Signed)
Anesthesia Evaluation  Patient identified by MRN, date of birth, ID band Patient awake    Reviewed: Allergy & Precautions, NPO status , Patient's Chart, lab work & pertinent test results  Airway Mallampati: I       Dental no notable dental hx. (+) Teeth Intact   Pulmonary former smoker,    Pulmonary exam normal breath sounds clear to auscultation       Cardiovascular hypertension, Pt. on medications and Pt. on home beta blockers Normal cardiovascular exam Rhythm:Regular Rate:Normal     Neuro/Psych    GI/Hepatic GERD  Medicated and Controlled,  Endo/Other    Renal/GU   negative genitourinary   Musculoskeletal   Abdominal Normal abdominal exam  (+)   Peds  Hematology negative hematology ROS (+)   Anesthesia Other Findings   Reproductive/Obstetrics                             Anesthesia Physical  Anesthesia Plan  ASA: II  Anesthesia Plan: General   Post-op Pain Management:  Regional for Post-op pain   Induction: Intravenous  PONV Risk Score and Plan: 1 and Ondansetron  Airway Management Planned: Oral ETT  Additional Equipment:   Intra-op Plan:   Post-operative Plan: Extubation in OR  Informed Consent: I have reviewed the patients History and Physical, chart, labs and discussed the procedure including the risks, benefits and alternatives for the proposed anesthesia with the patient or authorized representative who has indicated his/her understanding and acceptance.       Plan Discussed with: CRNA and Surgeon  Anesthesia Plan Comments:         Anesthesia Quick Evaluation

## 2019-01-31 NOTE — Transfer of Care (Signed)
Immediate Anesthesia Transfer of Care Note  Patient: Patrick Bailey.  Procedure(s) Performed: TOTAL KNEE ARTHROPLASTY (Right Knee)  Patient Location: PACU  Anesthesia Type:General  Level of Consciousness: awake, alert , oriented and patient cooperative  Airway & Oxygen Therapy: Patient Spontanous Breathing and Patient connected to face mask oxygen  Post-op Assessment: Report given to RN, Post -op Vital signs reviewed and stable and Patient moving all extremities  Post vital signs: Reviewed and stable  Last Vitals:  Vitals Value Taken Time  BP 142/94 01/31/19 1205  Temp    Pulse 72 01/31/19 1208  Resp 11 01/31/19 1208  SpO2 96 % 01/31/19 1208  Vitals shown include unvalidated device data.  Last Pain:  Vitals:   01/31/19 0902  TempSrc:   PainSc: 0-No pain      Patients Stated Pain Goal: 3 (42/90/37 [id number])  Complications: No apparent anesthesia complications

## 2019-02-01 ENCOUNTER — Encounter (HOSPITAL_COMMUNITY): Payer: Self-pay | Admitting: Orthopedic Surgery

## 2019-02-01 DIAGNOSIS — M1711 Unilateral primary osteoarthritis, right knee: Secondary | ICD-10-CM | POA: Diagnosis not present

## 2019-02-01 NOTE — Discharge Summary (Signed)
Discharge Summary  Patient ID: Patrick Bailey. MRN: [id number] DOB/AGE: September 18, 1960 58 y.o.  Admit date: 01/31/2019 Discharge date: 02/01/2019  Admission Diagnoses:  Primary osteoarthritis of right knee  Discharge Diagnoses:  Principal Problem:   Primary osteoarthritis of right knee Active Problems:   Anxiety state   Alcohol use (HCC)   Essential hypertension   Pseudogout   Hypothyroidism   Hypertriglyceridemia   History of cerebellar stroke   S/P total knee arthroplasty   Past Medical History:  Diagnosis Date  . Abnormality of gait 07/17/2016  . Alcohol use   . Arthritis   . Cerebellar stroke ([place]) 07/17/2016   Presumed, causing gait abnormality s/p eval by neuro [name]) 07/2016 overall improving.   Marland Kitchen CPDD (calcium pyrophosphate deposition disease) 10/2013   R hand xray, L knee xray  . GERD (gastroesophageal reflux disease)   . Gout   . History of smoking   . HTN (hypertension)   . Hypertension   . Hypothyroidism (acquired)   . Insomnia   . Throat cancer ([place]) 11 years ago    Dr. Caryl Pina ([facility])/ 33 radiation treatments/ 2 shots chemo  . TOBACCO ABUSE, HX OF 02/05/2009   Quit 2011      Surgeries: Procedure(s): TOTAL KNEE ARTHROPLASTY on 01/31/2019   Consultants (if any):   Discharged Condition: Improved  Hospital Course: [name]. is an 58 y.o. male who was admitted 01/31/2019 with a diagnosis of Primary osteoarthritis of right knee and went to the operating room on 01/31/2019 and underwent the above named procedures.    He was given perioperative antibiotics:  Anti-infectives (From admission, onward)   Start     Dose/Rate Route Frequency Ordered Stop   01/31/19 1600  ceFAZolin (ANCEF) IVPB 1 g/50 mL premix     1 g 100 mL/hr over 30 Minutes Intravenous Every 6 hours 01/31/19 1319 01/31/19 2023   01/31/19 0800  ceFAZolin (ANCEF) IVPB 2g/100 mL premix     2 g 200 mL/hr over 30 Minutes Intravenous On call to O.R. 01/31/19 [id number] 01/31/19 1010    .  He  was given sequential compression devices, early ambulation, and aspirin for DVT prophylaxis.  He benefited maximally from the hospital stay and there were no complications.    Recent vital signs:  Vitals:   02/01/19 0005 02/01/19 0510  BP: (!) 150/86 116/74  Pulse: 88 85  Resp: 16 16  Temp: 98 F (36.7 C) (!) 97.5 F (36.4 C)  SpO2: 99% 98%    Recent laboratory studies:  Lab Results  Component Value Date   HGB 14.8 01/23/2019   HGB 14.0 01/06/2019   HGB 13.6 02/04/2018   Lab Results  Component Value Date   WBC 6.7 01/23/2019   PLT 249 01/23/2019   Lab Results  Component Value Date   INR 1.0 01/06/2019   Lab Results  Component Value Date   NA 140 01/23/2019   K 4.7 01/23/2019   CL 103 01/23/2019   CO2 26 01/23/2019   BUN 16 01/23/2019   CREATININE 0.89 01/23/2019   GLUCOSE 120 (H) 01/23/2019    Discharge Medications:   Allergies as of 02/01/2019   No Known Allergies     Medication List    STOP taking these medications   ADVIL PM PO     TAKE these medications   acetaminophen 500 MG tablet Commonly known as: TYLENOL Take 2 tablets (1,000 mg total) by mouth every 8 (eight) hours for 10 days. For Pain.  amLODipine 10 MG tablet Commonly known as: NORVASC TAKE 1 TABLET BY MOUTH ONCE DAILY   aspirin EC 81 MG tablet Take 1 tablet (81 mg total) by mouth 2 (two) times daily. For DVT prophylaxis for 30 days after surgery. What changed:   when to take this  additional instructions   atorvastatin 40 MG tablet Commonly known as: LIPITOR Take 1 tablet (40 mg total) by mouth daily.   celecoxib 200 MG capsule Commonly known as: CeleBREX Take 1 capsule (200 mg total) by mouth 2 (two) times daily for 14 days. For 2 weeks post op for pain.  Discontinue Ibuprofen.   DentaGel 1.1 % Gel dental gel Generic drug: sodium fluoride Place 1 application onto teeth at bedtime.   docusate sodium 100 MG capsule Commonly known as: Colace Take 1 capsule (100 mg total)  by mouth 2 (two) times daily. To prevent constipation while taking pain medication.   doxepin 25 MG capsule Commonly known as: SINEQUAN Take 1 capsule (25 mg total) by mouth at bedtime.   fenofibrate 160 MG tablet Take 1 tablet (160 mg total) by mouth daily.   Fish Oil 1000 MG Caps Take 2 capsules (2,000 mg total) by mouth 2 (two) times a day.   gabapentin 300 MG capsule Commonly known as: Neurontin Take 1 capsule (300 mg total) by mouth 2 (two) times daily for 14 days. For 2 weeks post op for pain.   levothyroxine 150 MCG tablet Commonly known as: SYNTHROID Take 1 tablet (150 mcg total) by mouth daily. What changed: when to take this   methocarbamol 500 MG tablet Commonly known as: Robaxin Take 1 tablet (500 mg total) by mouth every 8 (eight) hours as needed for muscle spasms.   metoprolol tartrate 100 MG tablet Commonly known as: LOPRESSOR Take 1 tablet (100 mg total) by mouth 2 (two) times daily.   Mitigare 0.6 MG Caps Generic drug: Colchicine TAKE 1 CAPSULE BY MOUTH IN THE MORNING AS NEEDED   omeprazole 20 MG capsule Commonly known as: PRILOSEC Take 20 mg by mouth daily.   ondansetron 4 MG tablet Commonly known as: Zofran Take 1 tablet (4 mg total) by mouth every 8 (eight) hours as needed for nausea or vomiting.   oxyCODONE 5 MG immediate release tablet Commonly known as: Roxicodone Take 1 tablet (5 mg total) by mouth every 4 (four) hours as needed for up to 7 days for breakthrough pain.   tamsulosin 0.4 MG Caps capsule Commonly known as: FLOMAX Take 1 capsule (0.4 mg total) by mouth daily after supper.   valACYclovir 1000 MG tablet Commonly known as: VALTREX Take 2 tablets (2,000 mg total) by mouth 2 (two) times daily. Take 4 tablets per flare, start at onset of symptoms What changed:   how much to take  when to take this       Diagnostic Studies: Dg Chest 2 View  Result Date: 01/07/2019 CLINICAL DATA:  58 year old male under preoperative evaluation.  EXAM: CHEST - 2 VIEW COMPARISON:  Chest x-ray 04/01/2015. FINDINGS: Mild diffuse peribronchial cuffing. Lung volumes are normal. No consolidative airspace disease. No pleural effusions. No pneumothorax. No pulmonary nodule or mass noted. Pulmonary vasculature and the cardiomediastinal silhouette are within normal limits. Atherosclerosis in the thoracic aorta. IMPRESSION: 1. Mild diffuse peribronchial cuffing may suggest mild bronchitis. 2. Aortic atherosclerosis. Electronically Signed   By: Vinnie Langton M.D.   On: 01/07/2019 19:12   Dg Knee Right Port  Result Date: 01/31/2019 CLINICAL DATA:  Status post right  knee replacement. EXAM: PORTABLE RIGHT KNEE - 1-2 VIEW COMPARISON:  None. FINDINGS: Patient is status post right knee replacement. Hardware is in good position. Postoperative air is seen in the soft tissues. IMPRESSION: Right knee replacement as above. Electronically Signed   By: Dorise Bullion III M.D   On: 01/31/2019 12:43    Disposition: Discharge disposition: 01-Home or Self Care       Discharge Instructions    Discharge patient   Complete by: As directed    Discharge disposition: 01-Home or Self Care   Discharge patient date: 02/01/2019      Follow-up Information    Renette Butters, MD.   Specialty: Orthopedic Surgery Contact information: [address] [place] [address] [phone]            Signed: Prudencio Burly III PA-C 02/01/2019, 7:55 AM

## 2019-02-01 NOTE — Progress Notes (Signed)
Physical Therapy Treatment Patient Details Name: Patrick Bailey. MRN: [id number] DOB: 06/22/1961 Today's Date: 02/01/2019    History of Present Illness 58 yo male s/p R TKR on 01/31/19. PMH includes cerebellar CVA 2017, OA, HTN, throat cancer, L TKR.    PT Comments    Pt ambulated in hallway and practiced safe stair technique.  Pt also performed LE exercises.  Pt reports being familiar with therapy from hx of L TKA.  Pt feels ready for d/c home today and had no further questions.  Follow Up Recommendations  Follow surgeon's recommendation for DC plan and follow-up therapies;Supervision for mobility/OOB     Equipment Recommendations  None recommended by PT    Recommendations for Other Services       Precautions / Restrictions Precautions Precautions: Fall;Knee Restrictions Other Position/Activity Restrictions: WBAT    Mobility  Bed Mobility Overal bed mobility: Needs Assistance Bed Mobility: Supine to Sit     Supine to sit: Supervision        Transfers Overall transfer level: Needs assistance Equipment used: Rolling walker (2 wheeled) Transfers: Sit to/from Stand Sit to Stand: Supervision         General transfer comment: verbal cues for hand placement  Ambulation/Gait Ambulation/Gait assistance: Min guard;Supervision Gait Distance (Feet): 160 Feet Assistive device: Rolling walker (2 wheeled) Gait Pattern/deviations: Step-through pattern;Decreased stride length     General Gait Details: verbal cues for step length, heel strike; pt pleased he is no longer "limping"   Stairs Stairs: Yes Stairs assistance: Min guard Stair Management: Step to pattern;Forwards;Two rails Number of Stairs: 3 General stair comments: verbal cues for sequence and safety, pt performs steps easily and denies increase in pain   Wheelchair Mobility    Modified Rankin (Stroke Patients Only)       Balance                                             Cognition Arousal/Alertness: Awake/alert Behavior During Therapy: WFL for tasks assessed/performed Overall Cognitive Status: Within Functional Limits for tasks assessed                                        Exercises Total Joint Exercises Ankle Circles/Pumps: AROM;10 reps;Both Quad Sets: AROM;Right;10 reps Heel Slides: AROM;Right;10 reps;Supine Hip ABduction/ADduction: AROM;Right;10 reps Straight Leg Raises: AROM;Right;10 reps Long Arc Quad: AROM;Right;Seated;10 reps Knee Flexion: AROM;Right;Seated;10 reps Goniometric ROM: R knee AAROM approx -5-100*    General Comments        Pertinent Vitals/Pain Pain Assessment: 0-10 Pain Score: 2  Pain Location: R knee Pain Descriptors / Indicators: Sore;Aching Pain Intervention(s): Monitored during session;Limited activity within patient's tolerance;Repositioned;Ice applied    Home Living                      Prior Function            PT Goals (current goals can now be found in the care plan section) Progress towards PT goals: Progressing toward goals    Frequency    7X/week      PT Plan Current plan remains appropriate    Co-evaluation              AM-PAC PT "6 Clicks" Mobility   Outcome Measure  Help needed  turning from your back to your side while in a flat bed without using bedrails?: A Little Help needed moving from lying on your back to sitting on the side of a flat bed without using bedrails?: A Little Help needed moving to and from a bed to a chair (including a wheelchair)?: A Little Help needed standing up from a chair using your arms (e.g., wheelchair or bedside chair)?: A Little Help needed to walk in hospital room?: A Little Help needed climbing 3-5 steps with a railing? : A Little 6 Click Score: 18    End of Session Equipment Utilized During Treatment: Gait belt Activity Tolerance: Patient tolerated treatment well Patient left: in chair;with call bell/phone within  reach   PT Visit Diagnosis: Difficulty in walking, not elsewhere classified (R26.2);Other abnormalities of gait and mobility (R26.89)     Time: 2482-5003 PT Time Calculation (min) (ACUTE ONLY): 23 min  Charges:  $Gait Training: 8-22 mins $Therapeutic Exercise: 8-22 mins                     Carmelia Bake, PT, DPT Acute Rehabilitation Services Office: [phone] Pager: [phone]  Trena Platt 02/01/2019, 12:47 PM

## 2019-02-01 NOTE — TOC Transition Note (Signed)
Transition of Care [facility]) - CM/SW Discharge Note   Patient Details  Name: Patrick Bailey. MRN: [id number] Date of Birth: 01-15-1961  Transition of Care [facility]) CM/SW Contact:  Leeroy Cha, RN Phone Number: 02/01/2019, 9:37 AM   Clinical Narrative:    Home with equipment and hhc-kah   Final next level of care: [place] Barriers to Discharge: No Barriers Identified   Patient Goals and CMS Choice Patient states their goals for this hospitalization and ongoing recovery are:: to go home CMS Medicare.gov Compare Post Acute Care list provided to:: Patient Choice offered to / list presented to : Patient  Discharge Placement                       Discharge Plan and Services   Discharge Planning Services: CM Consult Post Acute Care Choice: Durable Medical Equipment, Home Health          DME Arranged: Walker rolling, 3-N-1 DME Agency: [employer] Date DME Agency Contacted: 02/01/19 Time DME Agency Contacted: 0900 Representative spoke with at DME Agency: brent HH Arranged: PT [place]: [facility] [employer] Date [place]: 02/01/19 Time [place]: 0900 Representative spoke with at [place]: mike  Social Determinants of Health ([place]) Interventions     Readmission Risk Interventions No flowsheet data found.

## 2019-02-01 NOTE — Progress Notes (Signed)
    Subjective: Patient reports pain as mild to moderate.  Tolerating diet.  Urinating.  No CP, SOB.  Good early mobilization.    Objective:   VITALS:   Vitals:   01/31/19 1639 01/31/19 2035 02/01/19 0005 02/01/19 0510  BP: (!) 110/91 138/87 (!) 150/86 116/74  Pulse: (!) 102 (!) 111 88 85  Resp: 15 16 16 16   Temp: 97.9 F (36.6 C) 98 F (36.7 C) 98 F (36.7 C) (!) 97.5 F (36.4 C)  TempSrc: Oral     SpO2: 94% 99% 99% 98%  Weight:      Height:       CBC Latest Ref Rng & Units 01/23/2019 01/06/2019 02/04/2018  WBC 4.0 - 10.5 K/uL 6.7 6.0 5.2  Hemoglobin 13.0 - 17.0 g/dL 14.8 14.0 13.6  Hematocrit 39.0 - 52.0 % 43.9 39.7 38.9(L)  Platelets 150 - 400 K/uL 249 232.0 258.0   BMP Latest Ref Rng & Units 01/23/2019 01/06/2019 02/04/2018  Glucose 70 - 99 mg/dL 120(H) 94 115(H)  BUN 6 - 20 mg/dL 16 14 18   Creatinine 0.61 - 1.24 mg/dL 0.89 1.01 0.92  Sodium 135 - 145 mmol/L 140 136 138  Potassium 3.5 - 5.1 mmol/L 4.7 3.7 4.7  Chloride 98 - 111 mmol/L 103 100 102  CO2 22 - 32 mmol/L 26 29 29   Calcium 8.9 - 10.3 mg/dL 9.9 9.4 9.4   Intake/Output      06/30 0701 - 07/01 0700 07/01 0701 - 07/02 0700   P.O. 1080    I.V. (mL/kg) 2457.6 (27.6)    IV Piggyback 268.2    Total Intake(mL/kg) 3805.8 (42.7)    Urine (mL/kg/hr) 3050    Stool 0    Blood 25    Total Output 3075    Net +730.8         Stool Occurrence 0 x       Physical Exam: General: NAD.  Upright in bed eating breakfast.  Calm, conversant. Resp: No increased wob Cardio: regular rate and rhythm ABD soft Neurologically intact MSK RLE: Neurovascularly intact Sensation intact distally Feet warm Dorsiflexion/Plantar flexion intact Incision: dressing C/D/I   Assessment: 1 Day Post-Op  S/P Procedure(s) (LRB): TOTAL KNEE ARTHROPLASTY (Right) by Dr. Ernesta Amble. Percell Miller on 01/31/2019  Principal Problem:   Primary osteoarthritis of right knee Active Problems:   Anxiety state   Alcohol use (HCC)   Essential hypertension   Pseudogout   Hypothyroidism   Hypertriglyceridemia   History of cerebellar stroke   S/P total knee arthroplasty   Primary osteoarthritis, status post right total knee arthroplasty Doing well postop day 1 Eating, drinking, voiding Pain controlled Mobilizing well   Plan: Up with therapy D/C IV fluids Incentive Spirometry Elevate and Apply ice CPM, bone foam  Weight Bearing: Weight Bearing as Tolerated (WBAT) RLE Dressings: Maintain Mepilex.   VTE prophylaxis: Aspirin, SCDs, ambulation Dispo: Home today    Patient's anticipated LOS is less than 2 midnights, meeting these requirements: - Younger than 35 - Lives within 1 hour of care - Has a competent adult at home to recover with post-op recover - NO history of  - Chronic pain requiring opiods  - Diabetes  - Coronary Artery Disease  - Heart failure  - Heart attack  - Stroke  - DVT/VTE  - Cardiac arrhythmia  - Respiratory Failure/COPD  - Renal failure  - Anemia  - Advanced Liver disease    Prudencio Burly III, PA-C 02/01/2019, 7:51 AM

## 2019-02-02 ENCOUNTER — Telehealth: Payer: Self-pay | Admitting: Family Medicine

## 2019-02-02 NOTE — Telephone Encounter (Signed)
Elective surgery no TCM.

## 2019-02-14 ENCOUNTER — Telehealth: Payer: Self-pay | Admitting: Family Medicine

## 2019-02-15 NOTE — Telephone Encounter (Signed)
Best number [phone] Pt called checking on his rx Pt is out of meds

## 2019-02-15 NOTE — Telephone Encounter (Signed)
E-scribed amlodipine refill.  Amitriptyline denied due to Dr. Darnell Level changing strength from 75 mg to 50 mg.   Plz schedule for CPE and labs.

## 2019-02-16 NOTE — Telephone Encounter (Signed)
Pt is scheduled for labs 03/31/19 and cpe on 04/07/19.

## 2019-02-16 NOTE — Telephone Encounter (Signed)
Noted  

## 2019-02-17 MED ORDER — AMITRIPTYLINE HCL 50 MG PO TABS: 50.0000 mg | ORAL_TABLET | Freq: Every day | ORAL | 1 refills | Status: DC

## 2019-02-17 NOTE — Telephone Encounter (Addendum)
E-scribed refill for amitriptyline 50 mg tab.   Spoke with pt notifying him refill was sent.  Pt expresses his thanks.

## 2019-02-17 NOTE — Addendum Note (Signed)
Addended by: Brenton Grills on: 10/30/1914 60:60 AM   Modules accepted: Orders

## 2019-02-22 ENCOUNTER — Other Ambulatory Visit: Payer: Self-pay | Admitting: Family Medicine

## 2019-03-31 ENCOUNTER — Other Ambulatory Visit (INDEPENDENT_AMBULATORY_CARE_PROVIDER_SITE_OTHER): Payer: 59

## 2019-03-31 ENCOUNTER — Other Ambulatory Visit: Payer: Self-pay | Admitting: Family Medicine

## 2019-03-31 ENCOUNTER — Other Ambulatory Visit: Payer: Self-pay

## 2019-03-31 DIAGNOSIS — Z125 Encounter for screening for malignant neoplasm of prostate: Secondary | ICD-10-CM

## 2019-03-31 DIAGNOSIS — E781 Pure hyperglyceridemia: Secondary | ICD-10-CM | POA: Diagnosis not present

## 2019-03-31 DIAGNOSIS — E039 Hypothyroidism, unspecified: Secondary | ICD-10-CM

## 2019-03-31 DIAGNOSIS — I10 Essential (primary) hypertension: Secondary | ICD-10-CM

## 2019-03-31 LAB — COMPREHENSIVE METABOLIC PANEL
ALT: 30 U/L (ref 0–53)
AST: 24 U/L (ref 0–37)
Albumin: 4.5 g/dL (ref 3.5–5.2)
Alkaline Phosphatase: 65 U/L (ref 39–117)
BUN: 11 mg/dL (ref 6–23)
CO2: 29 mEq/L (ref 19–32)
Calcium: 9.8 mg/dL (ref 8.4–10.5)
Chloride: 98 mEq/L (ref 96–112)
Creatinine, Ser: 0.97 mg/dL (ref 0.40–1.50)
GFR: 79.23 mL/min (ref >60.00)
Glucose, Bld: 101 mg/dL — ABNORMAL HIGH (ref 70–99)
Potassium: 5.2 mEq/L — ABNORMAL HIGH (ref 3.5–5.1)
Sodium: 135 mEq/L (ref 135–145)
Total Bilirubin: 0.6 mg/dL (ref 0.2–1.2)
Total Protein: 6.9 g/dL (ref 6.0–8.3)

## 2019-03-31 LAB — LIPID PANEL
Cholesterol: 141 mg/dL (ref 0–200)
HDL: 37.5 mg/dL — ABNORMAL LOW (ref >39.00)
NonHDL: 103.4
Total CHOL/HDL Ratio: 4
Triglycerides: 293 mg/dL — ABNORMAL HIGH (ref 0.0–149.0)
VLDL: 58.6 mg/dL — ABNORMAL HIGH (ref 0.0–40.0)

## 2019-03-31 LAB — PSA: PSA: 0.54 ng/mL (ref 0.10–4.00)

## 2019-03-31 LAB — LDL CHOLESTEROL, DIRECT: Direct LDL: 53 mg/dL

## 2019-03-31 LAB — TSH: TSH: 0.46 u[IU]/mL (ref 0.35–4.50)

## 2019-04-04 ENCOUNTER — Other Ambulatory Visit: Payer: Self-pay

## 2019-04-04 ENCOUNTER — Other Ambulatory Visit: Payer: 59

## 2019-04-04 DIAGNOSIS — I10 Essential (primary) hypertension: Secondary | ICD-10-CM | POA: Diagnosis not present

## 2019-04-04 LAB — MICROALBUMIN / CREATININE URINE RATIO
Creatinine,U: 34.2 mg/dL
Microalb Creat Ratio: 2 mg/g (ref 0.0–30.0)
Microalb, Ur: <0.7 mg/dL (ref 0.0–1.9)

## 2019-04-07 ENCOUNTER — Ambulatory Visit (INDEPENDENT_AMBULATORY_CARE_PROVIDER_SITE_OTHER): Payer: 59 | Admitting: Family Medicine

## 2019-04-07 ENCOUNTER — Encounter: Payer: Self-pay | Admitting: Family Medicine

## 2019-04-07 ENCOUNTER — Other Ambulatory Visit: Payer: Self-pay

## 2019-04-07 VITALS — BP 134/90 | HR 90 | Temp 98.4°F | Ht 70.0 in | Wt 203.4 lb

## 2019-04-07 DIAGNOSIS — R351 Nocturia: Secondary | ICD-10-CM | POA: Diagnosis not present

## 2019-04-07 DIAGNOSIS — E039 Hypothyroidism, unspecified: Secondary | ICD-10-CM

## 2019-04-07 DIAGNOSIS — I10 Essential (primary) hypertension: Secondary | ICD-10-CM

## 2019-04-07 DIAGNOSIS — E781 Pure hyperglyceridemia: Secondary | ICD-10-CM

## 2019-04-07 DIAGNOSIS — Z0001 Encounter for general adult medical examination with abnormal findings: Secondary | ICD-10-CM

## 2019-04-07 DIAGNOSIS — F5104 Psychophysiologic insomnia: Secondary | ICD-10-CM

## 2019-04-07 DIAGNOSIS — Z87891 Personal history of nicotine dependence: Secondary | ICD-10-CM

## 2019-04-07 DIAGNOSIS — Z85819 Personal history of malignant neoplasm of unspecified site of lip, oral cavity, and pharynx: Secondary | ICD-10-CM

## 2019-04-07 DIAGNOSIS — Z789 Other specified health status: Secondary | ICD-10-CM

## 2019-04-07 DIAGNOSIS — Z7289 Other problems related to lifestyle: Secondary | ICD-10-CM | POA: Diagnosis not present

## 2019-04-07 DIAGNOSIS — M170 Bilateral primary osteoarthritis of knee: Secondary | ICD-10-CM

## 2019-04-07 DIAGNOSIS — Z8673 Personal history of transient ischemic attack (TIA), and cerebral infarction without residual deficits: Secondary | ICD-10-CM

## 2019-04-07 DIAGNOSIS — Z96653 Presence of artificial knee joint, bilateral: Secondary | ICD-10-CM

## 2019-04-07 LAB — POC URINALSYSI DIPSTICK (AUTOMATED)
Bilirubin, UA: NEGATIVE
Blood, UA: NEGATIVE
Glucose, UA: NEGATIVE
Ketones, UA: NEGATIVE
Leukocytes, UA: NEGATIVE
Nitrite, UA: NEGATIVE
Protein, UA: NEGATIVE
Spec Grav, UA: 1.02 (ref 1.010–1.025)
Urobilinogen, UA: 0.2 E.U./dL
pH, UA: 6 (ref 5.0–8.0)

## 2019-04-07 MED ORDER — FINASTERIDE 5 MG PO TABS: 5.0000 mg | ORAL_TABLET | Freq: Every day | ORAL | 3 refills | Status: DC

## 2019-04-07 MED ORDER — AMITRIPTYLINE HCL 50 MG PO TABS: 50.0000 mg | ORAL_TABLET | Freq: Every day | ORAL | 1 refills | Status: DC

## 2019-04-07 MED ORDER — TAMSULOSIN HCL 0.4 MG PO CAPS: 0.4000 mg | ORAL_CAPSULE | Freq: Every day | ORAL | 1 refills | Status: DC

## 2019-04-07 NOTE — Assessment & Plan Note (Signed)
Continue aspirin, statin.  

## 2019-04-07 NOTE — Assessment & Plan Note (Signed)
Chronic, stable. Continue current regimen. 

## 2019-04-07 NOTE — Assessment & Plan Note (Signed)
Increased alcohol use since he's been out of work after knee replacement. Continue to encourage cessation. Reviewed health risks of high alcohol intake.

## 2019-04-07 NOTE — Assessment & Plan Note (Addendum)
Continue lipitor, fibrate, fish oil. LDL at goal <70. Reviewed diet choices to improve triglyceride control. The ASCVD Risk score Mikey Bussing DC Jr., et al., 2013) failed to calculate for the following reasons:   The patient has a prior MI or stroke diagnosis

## 2019-04-07 NOTE — Progress Notes (Signed)
This visit was conducted in person.  BP 134/90 (BP Location: Right Arm, Patient Position: Sitting, Cuff Size: Normal)   Pulse 90   Temp 98.4 F (36.9 C) (Temporal)   Ht 5\' 10"  (1.778 m)   Wt 203 lb 7 oz (92.3 kg)   SpO2 98%   BMI 29.19 kg/m    CC: CPE Subjective:    Patient ID: Patrick Ochoa., male    DOB: 10/26/1960, 58 y.o.   MRN: [id number]  HPI: Patrick Foutch. is a 58 y.o. male presenting on 04/07/2019 for Annual Exam   H/o throat cancer s/p surgery/radiation 2010.  Cerebellar CVA 07/2016 affecting gait with mild residual imbalance, has seen Dr [name] neurology.  Recent R knee replacement surgery 02/2019. S/p L knee replacement 08/2017.  Insomnia and nocturia - started flomax. Doxepin 25mg  ineffective. Trial 3mg  - ineffective. Flomax hasn't helped. Continued nocturia x2-4 at night, and 20 times during the day. No stress or urge incontinence. He has tried OTC prostate medicine from [facility].   Preventative: COLONOSCOPY 03/2018 - mult TAs, diverticulosis, rpt 3 yrs (Nandigam) Prostate cancer screening - discussed, no fmhx prostate cancer, declines. Lung cancer screening -  Flu shot - declines Tdap 05/2014 Seat belt use discussed  Sunscreen use discussed. No changing moles on skin.  Ex smoker - quit 2010. Around other smokers.  Alcohol - increased since off work - 3-4 beers daily Dentist - regularly - some tooth grinding Eye exam - not recently  Lives alone - separated from wife. 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 Interim medical history since our last visit reviewed. Allergies and medications reviewed and updated. Outpatient Medications Prior to Visit  Medication Sig Dispense Refill  . amLODipine (NORVASC) 10 MG tablet Take 1 tablet by mouth once daily 90 tablet 0  .  aspirin EC 81 MG tablet Take 1 tablet (81 mg total) by mouth 2 (two) times daily. For DVT prophylaxis for 30 days after surgery. 60 tablet 0  . atorvastatin (LIPITOR) 40 MG tablet Take 1 tablet by mouth once daily 30 tablet 2  . DENTAGEL 1.1 % GEL dental gel Place 1 application onto teeth at bedtime.     . docusate sodium (COLACE) 100 MG capsule Take 1 capsule (100 mg total) by mouth 2 (two) times daily. To prevent constipation while taking pain medication. 30 capsule 0  . fenofibrate 160 MG tablet Take 1 tablet (160 mg total) by mouth daily. 90 tablet 3  . levothyroxine (EUTHYROX) 150 MCG tablet Take 1 tablet (150 mcg total) by mouth daily before breakfast. 30 tablet 2  . methocarbamol (ROBAXIN) 500 MG tablet Take 1 tablet (500 mg total) by mouth every 8 (eight) hours as needed for muscle spasms. 20 tablet 0  . metoprolol tartrate (LOPRESSOR) 100 MG tablet Take 1 tablet by mouth twice daily 60 tablet 2  . MITIGARE 0.6 MG CAPS TAKE 1 CAPSULE BY MOUTH IN THE MORNING AS NEEDED 90 capsule 0  . Omega-3 Fatty Acids (FISH OIL) 1000 MG CAPS Take 2 capsules (2,000 mg total) by mouth 2 (two) times a day.  0  . omeprazole (PRILOSEC) 20 MG capsule Take 20 mg by mouth daily.    . ondansetron (ZOFRAN) 4 MG tablet Take 1 tablet (4 mg total) by mouth every 8 (eight) hours as needed for nausea or  vomiting. 20 tablet 0  . valACYclovir (VALTREX) 1000 MG tablet Take 2 tablets (2,000 mg total) by mouth 2 (two) times daily. Take 4 tablets per flare, start at onset of symptoms (Patient taking differently: Take 4,000 mg by mouth See admin instructions. Take 4 tablets per flare, start at onset of symptoms) 20 tablet 1  . amitriptyline (ELAVIL) 50 MG tablet Take 1 tablet (50 mg total) by mouth at bedtime. 30 tablet 1  . doxepin (SINEQUAN) 25 MG capsule Take 1 capsule (25 mg total) by mouth at bedtime. 30 capsule 3  . gabapentin (NEURONTIN) 300 MG capsule Take 1 capsule (300 mg total) by mouth 2 (two) times daily for 14 days.  For 2 weeks post op for pain. 28 capsule 0  . tamsulosin (FLOMAX) 0.4 MG CAPS capsule Take 1 capsule (0.4 mg total) by mouth daily after supper. 30 capsule 1   No facility-administered medications prior to visit.      Per HPI unless specifically indicated in ROS section below Review of Systems  Constitutional: Negative for activity change, appetite change, chills, fatigue, fever and unexpected weight change.  HENT: Negative for hearing loss.   Eyes: Negative for visual disturbance.  Respiratory: Negative for cough, chest tightness, shortness of breath and wheezing.   Cardiovascular: Negative for chest pain, palpitations and leg swelling.  Gastrointestinal: Negative for abdominal distention, abdominal pain, blood in stool, constipation, diarrhea, nausea and vomiting.  Genitourinary: Negative for difficulty urinating and hematuria.  Musculoskeletal: Negative for arthralgias, myalgias and neck pain.  Skin: Negative for rash.  Neurological: Negative for dizziness, seizures, syncope and headaches.  Hematological: Negative for adenopathy. Does not bruise/bleed easily.  Psychiatric/Behavioral: Negative for dysphoric mood. The patient is not nervous/anxious.    Objective:    BP 134/90 (BP Location: Right Arm, Patient Position: Sitting, Cuff Size: Normal)   Pulse 90   Temp 98.4 F (36.9 C) (Temporal)   Ht 5\' 10"  (1.778 m)   Wt 203 lb 7 oz (92.3 kg)   SpO2 98%   BMI 29.19 kg/m   Wt Readings from Last 3 Encounters:  04/07/19 203 lb 7 oz (92.3 kg)  01/31/19 196 lb 7 oz (89.1 kg)  01/27/19 196 lb 7 oz (89.1 kg)    Physical Exam Vitals signs and nursing note reviewed.  Constitutional:      General: He is not in acute distress.    Appearance: Normal appearance. He is well-developed. He is not ill-appearing.  HENT:     Head: Normocephalic and atraumatic.     Right Ear: Hearing, tympanic membrane, ear canal and external ear normal.     Left Ear: Hearing, tympanic membrane, ear canal and  external ear normal.     Nose: Nose normal.     Mouth/Throat:     Mouth: Mucous membranes are moist.     Pharynx: Uvula midline. No oropharyngeal exudate or posterior oropharyngeal erythema.  Eyes:     General: No scleral icterus.    Extraocular Movements: Extraocular movements intact.     Conjunctiva/sclera: Conjunctivae normal.     Pupils: Pupils are equal, round, and reactive to light.  Neck:     Musculoskeletal: Normal range of motion.     Vascular: No carotid bruit.     Comments: Woody consistency to neck Cardiovascular:     Rate and Rhythm: Normal rate and regular rhythm.     Pulses: Normal pulses.          Radial pulses are 2+ on the right side  and 2+ on the left side.     Heart sounds: Normal heart sounds. No murmur.  Pulmonary:     Effort: Pulmonary effort is normal. No respiratory distress.     Breath sounds: Normal breath sounds. No wheezing, rhonchi or rales.  Abdominal:     General: Abdomen is flat. Bowel sounds are normal. There is no distension.     Palpations: Abdomen is soft. There is no mass.     Tenderness: There is no abdominal tenderness. There is no guarding or rebound.     Hernia: No hernia is present.  Genitourinary:    Comments: Pt declines DRE Musculoskeletal: Normal range of motion.  Lymphadenopathy:     Cervical: No cervical adenopathy.  Skin:    General: Skin is warm and dry.     Findings: No rash.  Neurological:     General: No focal deficit present.     Mental Status: He is alert and oriented to person, place, and time.     Comments: CN grossly intact, station and gait intact  Psychiatric:        Mood and Affect: Mood normal.        Behavior: Behavior normal.        Thought Content: Thought content normal.        Judgment: Judgment normal.       Results for orders placed or performed in visit on 04/04/19  Microalbumin / creatinine urine ratio  Result Value Ref Range   Microalb, Ur <0.7 0.0 - 1.9 mg/dL   Creatinine,U 34.2 mg/dL    Microalb Creat Ratio 2.0 0.0 - 30.0 mg/g   Assessment & Plan:   Problem List Items Addressed This Visit    TOBACCO ABUSE, HX OF    Encouraged continued abstinence. Will discuss lung cancer screening.       S/P total knee arthroplasty   Nocturia    Nocturia and daytime frequency. Presumed from BPH. Did not respond to flomax trial. Will trial finasteride/flomax x 2 months then reassess at f/u visit. I recommended DRE for further evaluation, he declines. Discussed uro eval if no improvement with above.  I-PSS = 29-6, severe symptoms.       Hypothyroidism    Chronic, stable. Continue current regimen.       Hypertriglyceridemia    Continue lipitor, fibrate, fish oil. LDL at goal <70. Reviewed diet choices to improve triglyceride control. The ASCVD Risk score Mikey Bussing DC Jr., et al., 2013) failed to calculate for the following reasons:   The patient has a prior MI or stroke diagnosis       History of throat cancer   History of cerebellar stroke    Continue aspirin, statin.      Essential hypertension    Chronic, stable. Continue current regimen.       Encounter for well adult exam with abnormal findings - Primary    Preventative protocols reviewed and updated unless pt declined. Discussed healthy diet and lifestyle.       Degenerative arthritis of knee, bilateral   Chronic insomnia    Doxepin, silenor ineffective. Has restarted amitriptyline. Anticipate alcohol contributing as well as nocturia.      Alcohol use ([place])    Increased alcohol use since he's been out of work after knee replacement. Continue to encourage cessation. Reviewed health risks of high alcohol intake.           Meds ordered this encounter  Medications  . amitriptyline (ELAVIL) 50 MG tablet  Sig: Take 1 tablet (50 mg total) by mouth at bedtime.    Dispense:  90 tablet    Refill:  1  . tamsulosin (FLOMAX) 0.4 MG CAPS capsule    Sig: Take 1 capsule (0.4 mg total) by mouth daily after supper.     Dispense:  30 capsule    Refill:  1  . finasteride (PROSCAR) 5 MG tablet    Sig: Take 1 tablet (5 mg total) by mouth daily.    Dispense:  30 tablet    Refill:  3   No orders of the defined types were placed in this encounter.   Follow up plan: Return in about 2 months (around 06/07/2019) for follow up visit.  Ria Bush, MD

## 2019-04-07 NOTE — Assessment & Plan Note (Signed)
Doxepin, silenor ineffective. Has restarted amitriptyline. Anticipate alcohol contributing as well as nocturia.

## 2019-04-07 NOTE — Assessment & Plan Note (Signed)
Nocturia and daytime frequency. Presumed from BPH. Did not respond to flomax trial. Will trial finasteride/flomax x 2 months then reassess at f/u visit. I recommended DRE for further evaluation, he declines. Discussed uro eval if no improvement with above.  I-PSS = 29-6, severe symptoms.

## 2019-04-07 NOTE — Assessment & Plan Note (Addendum)
Encouraged continued abstinence. Will discuss lung cancer screening.

## 2019-04-07 NOTE — Addendum Note (Signed)
Addended by: Brenton Grills on: [id number] PM   Modules accepted: Orders

## 2019-04-07 NOTE — Assessment & Plan Note (Signed)
Preventative protocols reviewed and updated unless pt declined. Discussed healthy diet and lifestyle.  

## 2019-04-07 NOTE — Patient Instructions (Addendum)
Decrease alcohol - for better sleep, for improved urinary symptoms, and for liver health. Increase water to help keep potassium levels under control.  Bring [place] urine sample at your convenience.  For possible enlarged prostate, restart flomax but add finasteride daily (slowly shrinks prostate).  Return in 2 months for follow up visit.   Health Maintenance, Male Adopting a healthy lifestyle and getting preventive care are important in promoting health and wellness. Ask your health care provider about:  The right schedule for you to have regular tests and exams.  Things you can do on your own to prevent diseases and keep yourself healthy. What should I know about diet, weight, and exercise? Eat a healthy diet   Eat a diet that includes plenty of vegetables, fruits, low-fat dairy products, and lean protein.  Do not eat a lot of foods that are high in solid fats, added sugars, or sodium. Maintain a healthy weight Body mass index (BMI) is a measurement that can be used to identify possible weight problems. It estimates body fat based on height and weight. Your health care provider can help determine your BMI and help you achieve or maintain a healthy weight. Get regular exercise Get regular exercise. This is one of the most important things you can do for your health. Most adults should:  Exercise for at least 150 minutes each week. The exercise should increase your heart rate and make you sweat (moderate-intensity exercise).  Do strengthening exercises at least twice a week. This is in addition to the moderate-intensity exercise.  Spend less time sitting. Even light physical activity can be beneficial. Watch cholesterol and blood lipids Have your blood tested for lipids and cholesterol at 59 years of age, then have this test every 5 years. You may need to have your cholesterol levels checked more often if:  Your lipid or cholesterol levels are high.  You are older than 59 years of  age.  You are at high risk for heart disease. What should I know about cancer screening? Many types of cancers can be detected early and may often be prevented. Depending on your health history and family history, you may need to have cancer screening at various ages. This may include screening for:  Colorectal cancer.  Prostate cancer.  Skin cancer.  Lung cancer. What should I know about heart disease, diabetes, and high blood pressure? Blood pressure and heart disease  High blood pressure causes heart disease and increases the risk of stroke. This is more likely to develop in people who have high blood pressure readings, are of African descent, or are overweight.  Talk with your health care provider about your target blood pressure readings.  Have your blood pressure checked: ? Every 3-5 years if you are 4-71 years of age. ? Every year if you are 80 years old or older.  If you are between the ages of 73 and 57 and are a current or former smoker, ask your health care provider if you should have a one-time screening for abdominal aortic aneurysm (AAA). Diabetes Have regular diabetes screenings. This checks your fasting blood sugar level. Have the screening done:  Once every three years after age 37 if you are at a normal weight and have a low risk for diabetes.  More often and at a younger age if you are overweight or have a high risk for diabetes. What should I know about preventing infection? Hepatitis B If you have a higher risk for hepatitis B, you should be  screened for this virus. Talk with your health care provider to find out if you are at risk for hepatitis B infection. Hepatitis C Blood testing is recommended for:  Everyone born from [birth year] through 1966.  Anyone with known risk factors for hepatitis C. Sexually transmitted infections (STIs)  You should be screened each year for STIs, including gonorrhea and chlamydia, if: ? You are sexually active and are younger  than 59 years of age. ? You are older than 59 years of age and your health care provider tells you that you are at risk for this type of infection. ? Your sexual activity has changed since you were last screened, and you are at increased risk for chlamydia or gonorrhea. Ask your health care provider if you are at risk.  Ask your health care provider about whether you are at high risk for HIV. Your health care provider may recommend a prescription medicine to help prevent HIV infection. If you choose to take medicine to prevent HIV, you should first get tested for HIV. You should then be tested every 3 months for as long as you are taking the medicine. Follow these instructions at home: Lifestyle  Do not use any products that contain nicotine or tobacco, such as cigarettes, e-cigarettes, and chewing tobacco. If you need help quitting, ask your health care provider.  Do not use street drugs.  Do not share needles.  Ask your health care provider for help if you need support or information about quitting drugs. Alcohol use  Do not drink alcohol if your health care provider tells you not to drink.  If you drink alcohol: ? Limit how much you have to 0-2 drinks a day. ? Be aware of how much alcohol is in your drink. In the [place] one drink equals one 12 oz bottle of beer (355 mL), one 5 oz glass of wine (148 mL), or one 1 oz glass of hard liquor (44 mL). General instructions  Schedule regular health, dental, and eye exams.  Stay current with your vaccines.  Tell your health care provider if: ? You often feel depressed. ? You have ever been abused or do not feel safe at home. Summary  Adopting a healthy lifestyle and getting preventive care are important in promoting health and wellness.  Follow your health care provider's instructions about healthy diet, exercising, and getting tested or screened for diseases.  Follow your health care provider's instructions on monitoring your  cholesterol and blood pressure. This information is not intended to replace advice given to you by your health care provider. Make sure you discuss any questions you have with your health care provider. Document Released: 01/16/2008 Document Revised: 07/13/2018 Document Reviewed: 07/13/2018 Elsevier Patient Education  2020 [employer].

## 2019-05-05 ENCOUNTER — Other Ambulatory Visit: Payer: Self-pay | Admitting: Orthopedic Surgery

## 2019-05-05 DIAGNOSIS — T84033A Mechanical loosening of internal left knee prosthetic joint, initial encounter: Secondary | ICD-10-CM

## 2019-05-12 ENCOUNTER — Encounter (HOSPITAL_COMMUNITY): Payer: 59

## 2019-05-12 ENCOUNTER — Other Ambulatory Visit: Payer: Self-pay | Admitting: Family Medicine

## 2019-05-14 ENCOUNTER — Other Ambulatory Visit: Payer: Self-pay | Admitting: Family Medicine

## 2019-05-15 NOTE — Telephone Encounter (Signed)
Last office visit 04/07/2019 for CPE.  Last refilled 12/14/2018 for #20 with 1 refill.  No future appointments with PCP.

## 2019-05-19 ENCOUNTER — Other Ambulatory Visit: Payer: Self-pay

## 2019-05-19 ENCOUNTER — Encounter (HOSPITAL_COMMUNITY): Payer: Self-pay

## 2019-05-19 ENCOUNTER — Encounter (HOSPITAL_COMMUNITY)
Admission: RE | Admit: 2019-05-19 | Discharge: 2019-05-19 | Disposition: A | Discharge status: Home / Self Care | Payer: 59 | Source: Ambulatory Visit | Attending: Orthopedic Surgery | Admitting: Orthopedic Surgery

## 2019-05-19 ENCOUNTER — Encounter (HOSPITAL_COMMUNITY): Admission: RE | Admit: 2019-05-19 | Payer: 59 | Source: Ambulatory Visit

## 2019-05-19 DIAGNOSIS — T84033A Mechanical loosening of internal left knee prosthetic joint, initial encounter: Secondary | ICD-10-CM | POA: Insufficient documentation

## 2019-05-19 MED ORDER — REGADENOSON 0.4 MG/5ML IV SOLN: 0.4000 mg | Freq: Once | INTRAVENOUS | Status: DC

## 2019-05-29 ENCOUNTER — Other Ambulatory Visit: Payer: Self-pay | Admitting: Family Medicine

## 2019-05-29 NOTE — Telephone Encounter (Signed)
Valtrex Last filled:  05/15/19, #4 Last OV:  04/07/19, CPE Next OV:  none

## 2019-05-30 ENCOUNTER — Other Ambulatory Visit: Payer: Self-pay | Admitting: Family Medicine

## 2019-06-02 ENCOUNTER — Other Ambulatory Visit: Payer: Self-pay | Admitting: Family Medicine

## 2019-06-09 ENCOUNTER — Encounter (HOSPITAL_COMMUNITY)
Admission: RE | Admit: 2019-06-09 | Discharge: 2019-06-09 | Disposition: A | Discharge status: Home / Self Care | Payer: 59 | Source: Ambulatory Visit | Attending: Orthopedic Surgery | Admitting: Orthopedic Surgery

## 2019-06-09 ENCOUNTER — Other Ambulatory Visit: Payer: Self-pay

## 2019-06-09 DIAGNOSIS — T84033A Mechanical loosening of internal left knee prosthetic joint, initial encounter: Secondary | ICD-10-CM | POA: Diagnosis not present

## 2019-06-09 MED ORDER — TECHNETIUM TC 99M MEDRONATE IV KIT
20.0000 | PACK | Freq: Once | INTRAVENOUS | Status: AC | PRN
  Administered 2019-06-09: 20 via INTRAVENOUS

## 2019-06-14 ENCOUNTER — Encounter: Payer: Self-pay | Admitting: Emergency Medicine

## 2019-06-14 ENCOUNTER — Other Ambulatory Visit: Payer: Self-pay

## 2019-06-14 ENCOUNTER — Emergency Department
Admission: EM | Admit: 2019-06-14 | Discharge: 2019-06-14 | Disposition: A | Discharge status: Home / Self Care | Payer: 59 | Attending: Emergency Medicine | Admitting: Emergency Medicine

## 2019-06-14 DIAGNOSIS — I10 Essential (primary) hypertension: Secondary | ICD-10-CM | POA: Insufficient documentation

## 2019-06-14 DIAGNOSIS — Z85818 Personal history of malignant neoplasm of other sites of lip, oral cavity, and pharynx: Secondary | ICD-10-CM | POA: Diagnosis not present

## 2019-06-14 DIAGNOSIS — Z96653 Presence of artificial knee joint, bilateral: Secondary | ICD-10-CM | POA: Diagnosis not present

## 2019-06-14 DIAGNOSIS — R159 Full incontinence of feces: Secondary | ICD-10-CM | POA: Diagnosis not present

## 2019-06-14 DIAGNOSIS — R197 Diarrhea, unspecified: Secondary | ICD-10-CM | POA: Diagnosis present

## 2019-06-14 DIAGNOSIS — Z85810 Personal history of malignant neoplasm of tongue: Secondary | ICD-10-CM | POA: Insufficient documentation

## 2019-06-14 DIAGNOSIS — E039 Hypothyroidism, unspecified: Secondary | ICD-10-CM | POA: Insufficient documentation

## 2019-06-14 DIAGNOSIS — R531 Weakness: Secondary | ICD-10-CM | POA: Diagnosis not present

## 2019-06-14 DIAGNOSIS — Z87891 Personal history of nicotine dependence: Secondary | ICD-10-CM | POA: Diagnosis not present

## 2019-06-14 DIAGNOSIS — Z79899 Other long term (current) drug therapy: Secondary | ICD-10-CM | POA: Insufficient documentation

## 2019-06-14 DIAGNOSIS — I2510 Atherosclerotic heart disease of native coronary artery without angina pectoris: Secondary | ICD-10-CM | POA: Insufficient documentation

## 2019-06-14 HISTORY — DX: Atherosclerotic heart disease of native coronary artery without angina pectoris: I25.10

## 2019-06-14 LAB — BASIC METABOLIC PANEL
Anion gap: 9 (ref 5–15)
BUN: 11 mg/dL (ref 6–20)
CO2: 26 mmol/L (ref 22–32)
Calcium: 9.2 mg/dL (ref 8.9–10.3)
Chloride: 102 mmol/L (ref 98–111)
Creatinine, Ser: 0.94 mg/dL (ref 0.61–1.24)
GFR calc Af Amer: >60 mL/min (ref >60)
GFR calc non Af Amer: >60 mL/min (ref >60)
Glucose, Bld: 119 mg/dL — ABNORMAL HIGH (ref 70–99)
Potassium: 4 mmol/L (ref 3.5–5.1)
Sodium: 137 mmol/L (ref 135–145)

## 2019-06-14 LAB — CBC WITH DIFFERENTIAL/PLATELET
Abs Immature Granulocytes: 0.02 10*3/uL (ref 0.00–0.07)
Basophils Absolute: 0.1 10*3/uL (ref 0.0–0.1)
Basophils Relative: 1 %
Eosinophils Absolute: 0.2 10*3/uL (ref 0.0–0.5)
Eosinophils Relative: 4 %
HCT: 39.1 % (ref 39.0–52.0)
Hemoglobin: 13.5 g/dL (ref 13.0–17.0)
Immature Granulocytes: 0 %
Lymphocytes Relative: 23 %
Lymphs Abs: 1.3 10*3/uL (ref 0.7–4.0)
MCH: 32.1 pg (ref 26.0–34.0)
MCHC: 34.5 g/dL (ref 30.0–36.0)
MCV: 92.9 fL (ref 80.0–100.0)
Monocytes Absolute: 0.5 10*3/uL (ref 0.1–1.0)
Monocytes Relative: 9 %
Neutro Abs: 3.5 10*3/uL (ref 1.7–7.7)
Neutrophils Relative %: 63 %
Platelets: 238 10*3/uL (ref 150–400)
RBC: 4.21 MIL/uL — ABNORMAL LOW (ref 4.22–5.81)
RDW: 12.8 % (ref 11.5–15.5)
WBC: 5.5 10*3/uL (ref 4.0–10.5)
nRBC: 0 % (ref 0.0–0.2)

## 2019-06-14 NOTE — Discharge Instructions (Signed)
If you continue to have diarrhea, you may try taking over-the-counter Imodium (generic name is loperamide).  Please return to the ER for signs of blood in your stool or dark tarry stool, otherwise schedule follow-up with your primary care doctor.

## 2019-06-14 NOTE — ED Triage Notes (Signed)
Pt in via POV, reports having a bowel incontinence episode last night during his sleep.  Denies hx of same.  Denies and N/V/D; states, "I don't understand, I feel fine, but that has never happened."  NAD noted at this time.
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
## 2019-06-14 NOTE — ED Triage Notes (Signed)
First Nurse Note:  Arrives stating he feels fine, but had an episode of diarrhea last night while sleeping.

## 2019-07-07 MED ORDER — DENTAGEL 1.1 %
7 refills | 0 days | Status: CP
Start: 2019-07-07 — End: ?

## 2019-08-01 ENCOUNTER — Other Ambulatory Visit: Payer: Self-pay | Admitting: Family Medicine

## 2019-08-01 NOTE — Telephone Encounter (Signed)
Patient is due for follow up. Please schedule. Thank you

## 2019-08-02 NOTE — Telephone Encounter (Signed)
Patient has been scheduled

## 2019-08-11 ENCOUNTER — Ambulatory Visit: Payer: 59 | Admitting: Family Medicine

## 2019-09-04 ENCOUNTER — Other Ambulatory Visit: Payer: Self-pay | Admitting: Family Medicine

## 2019-09-05 NOTE — Telephone Encounter (Signed)
Last filled 05/29/2019 #20 with 1 refill  LOV 04/07/2019 CPE No future appointments made.

## 2019-11-17 ENCOUNTER — Telehealth: Payer: Self-pay | Admitting: Family Medicine

## 2019-11-17 ENCOUNTER — Ambulatory Visit: Payer: 59

## 2019-11-17 ENCOUNTER — Ambulatory Visit
Admit: 2019-11-17 | Discharge: 2019-11-18 | Payer: PRIVATE HEALTH INSURANCE | Attending: General Practice | Primary: General Practice

## 2019-11-17 NOTE — Telephone Encounter (Signed)
Pt came in to request a referral for his neuropathy. He states he has not discussed this with you. I tried to schedule him for an appointment to discuss and get a referral. He did not want an appointment. He is wanting requesting a call to discuss.

## 2019-11-17 NOTE — Telephone Encounter (Signed)
I do recommend office visit to review specific details. We can start neuropathy evaluation likely with blood work and evaluate for common causes, if not improved with treatment, will discuss referral.

## 2019-11-20 ENCOUNTER — Ambulatory Visit: Payer: 59 | Attending: Internal Medicine

## 2019-11-20 DIAGNOSIS — Z23 Encounter for immunization: Secondary | ICD-10-CM

## 2019-11-20 NOTE — Progress Notes (Signed)
   [id number]  Name:  [name].    MRN: [id number] DOB: 1961-05-18  11/20/2019  Mr. Blood was observed post Covid-19 immunization for 15 minutes without incident. He was provided with Vaccine Information Sheet and instruction to access the V-Safe system.   [name] was instructed to call 911 with any severe reactions post vaccine: Marland Kitchen Difficulty breathing  . Swelling of face and throat  . A fast heartbeat  . A bad rash all over body  . Dizziness and weakness   Immunizations Administered    Name Date Dose VIS Date Route   Pfizer COVID-19 Vaccine 11/20/2019  4:23 PM 0.3 mL 09/27/2018 Intramuscular   Manufacturer: [place]   Lot: [id number]   [place]: [id number]

## 2019-11-20 NOTE — Telephone Encounter (Signed)
Advised pt of need for PCP apt. Pt agreed but was driving and will call back to schedule.

## 2019-11-28 ENCOUNTER — Ambulatory Visit: Payer: 59 | Admitting: Family Medicine

## 2019-12-01 ENCOUNTER — Ambulatory Visit (INDEPENDENT_AMBULATORY_CARE_PROVIDER_SITE_OTHER): Payer: 59 | Admitting: Family Medicine

## 2019-12-01 ENCOUNTER — Ambulatory Visit: Payer: 59 | Admitting: Family Medicine

## 2019-12-01 ENCOUNTER — Other Ambulatory Visit: Payer: Self-pay

## 2019-12-01 ENCOUNTER — Encounter: Payer: Self-pay | Admitting: Family Medicine

## 2019-12-01 VITALS — BP 144/90 | HR 91 | Temp 97.9°F | Resp 22 | Ht 71.0 in | Wt 208.3 lb

## 2019-12-01 DIAGNOSIS — R399 Unspecified symptoms and signs involving the genitourinary system: Secondary | ICD-10-CM | POA: Diagnosis not present

## 2019-12-01 DIAGNOSIS — G629 Polyneuropathy, unspecified: Secondary | ICD-10-CM

## 2019-12-01 NOTE — Progress Notes (Signed)
This visit was conducted in person.  BP (!) 144/90   Pulse 91   Temp 97.9 F (36.6 C)   Resp (!) 22   Ht 5\' 11"  (1.803 m)   Wt 208 lb 5 oz (94.5 kg)   SpO2 98%   BMI 29.05 kg/m    CC: discuss several issues Subjective:    Patient ID: Patrick Ochoa., male    DOB: [birth year], 59 y.o.   MRN: [id number]  HPI: Patrick Hollie. is a 59 y.o. male presenting on 12/01/2019 for Numbness (the whole left leg and left foot. x 1 1/2 years. was recently diagnosed with neuropathy though [facility]) and Benign Prostatic Hypertrophy (paient states Proscar and Flomax are not helping urinary flow.)   Mother just passed away Patrick Bailey).   Chronic periph neuropathy - [facility] and brings QST test showing high sensory loss of L>R legs (scanned). Offered high cost treatment through that center which he cannot afford. He had NCS/EMG done 01/2018 as below:  NCS/EMG: Nerve conduction studies done on both lower extremities show probably motor findings with lower motor amplitudes and slight slowing of the right posterior tibial nerve, mild sensory alterations were also noted.  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 EMG evaluation of the left lower extremity did not show findings of an overlying radiculopathy (01/2018).  Progressively worsening numbness and paresthesias from feet up to thighs, L>R.  Describes burning pain from feet up to calf L>R, amitriptyline helps this.  No back pain. No weakness of legs. He is still able to work but is very worried about progression of numbness.  Non diabetic. Normal b12 levels 01/2018.  Alcohol - drinks a case on weekends (24 beers) and 2 beers on week days.   BPH - tried proscar + flomax x 2 months last year - no benefit to urinary flow. Currently off any meds. Notes increased urinary frequency and urgency, nocturia x2 at night. Previous UA normal. Previously IPSS = 29-6.  Lab Results   Component Value Date   PSA 0.54 03/31/2019   PSA 0.68 02/04/2018       Relevant past medical, surgical, family and social history reviewed and updated as indicated. Interim medical history since our last visit reviewed. Allergies and medications reviewed and updated. Outpatient Medications Prior to Visit  Medication Sig Dispense Refill  . amitriptyline (ELAVIL) 50 MG tablet Take 1 tablet (50 mg total) by mouth at bedtime. 90 tablet 1  . amLODipine (NORVASC) 10 MG tablet Take 1 tablet by mouth once daily 90 tablet 3  . aspirin EC 81 MG tablet Take 1 tablet (81 mg total) by mouth 2 (two) times daily. For DVT prophylaxis for 30 days after surgery. 60 tablet 0  . atorvastatin (LIPITOR) 40 MG tablet Take 1 tablet by mouth once daily 90 tablet 3  . DENTAGEL 1.1 % GEL dental gel Place 1 application onto teeth at bedtime.     . docusate sodium (COLACE) 100 MG capsule Take 1 capsule (100 mg total) by mouth 2 (two) times daily. To prevent constipation while taking pain medication. 30 capsule 0  . EUTHYROX 150 MCG tablet Take 1 tablet by mouth once daily 30 tablet 11  . fenofibrate 160 MG tablet Take 1 tablet (160 mg total) by mouth daily. 90 tablet 3  . metoprolol tartrate (LOPRESSOR) 100 MG tablet Take 1 tablet by mouth twice daily 180 tablet 3  . MITIGARE  0.6 MG CAPS TAKE 1 CAPSULE BY MOUTH IN THE MORNING AS NEEDED 90 capsule 0  . Omega-3 Fatty Acids (FISH OIL) 1000 MG CAPS Take 2 capsules (2,000 mg total) by mouth 2 (two) times a day.  0  . omeprazole (PRILOSEC) 20 MG capsule Take 20 mg by mouth daily.    . valACYclovir (VALTREX) 1000 MG tablet TAKE 2 TABLETS BY MOUTH TWICE DAILY ONE  TIME  PER  EPISODE 20 tablet 3  . finasteride (PROSCAR) 5 MG tablet Take 1 tablet by mouth once daily 30 tablet 0  . methocarbamol (ROBAXIN) 500 MG tablet Take 1 tablet (500 mg total) by mouth every 8 (eight) hours as needed for muscle spasms. 20 tablet 0  . ondansetron (ZOFRAN) 4 MG tablet Take 1 tablet (4 mg total)  by mouth every 8 (eight) hours as needed for nausea or vomiting. 20 tablet 0  . tamsulosin (FLOMAX) 0.4 MG CAPS capsule TAKE 1 CAPSULE BY MOUTH ONCE DAILY AFTER SUPPER (NEEDS  FOLLOW  UP  APPOINTMENT  FOR  ADDITIONAL  REFILLS) 30 capsule 0   No facility-administered medications prior to visit.     Per HPI unless specifically indicated in ROS section below Review of Systems Objective:  BP (!) 144/90   Pulse 91   Temp 97.9 F (36.6 C)   Resp (!) 22   Ht 5\' 11"  (1.803 m)   Wt 208 lb 5 oz (94.5 kg)   SpO2 98%   BMI 29.05 kg/m   Wt Readings from Last 3 Encounters:  12/01/19 208 lb 5 oz (94.5 kg)  06/14/19 200 lb (90.7 kg)  04/07/19 203 lb 7 oz (92.3 kg)      Physical Exam Vitals and nursing note reviewed.  Constitutional:      Appearance: Normal appearance. He is not ill-appearing.  Musculoskeletal:        General: No swelling or tenderness. Normal range of motion.     Right lower leg: No edema.     Left lower leg: No edema.  Skin:    General: Skin is warm and dry.     Findings: No erythema or rash.     Comments: Reticular veins to bilateral lower legs/feet  Neurological:     Mental Status: He is alert.     Sensory: Sensory deficit present.     Motor: Motor function is intact. No weakness.     Gait: Gait is intact.     Comments:  5/5 strength BLE Diminished sensation to light touch and monofilament testing from soles of feet to knees bilaterally  Psychiatric:        Mood and Affect: Mood normal.        Behavior: Behavior normal.       Results for orders placed or performed during the hospital encounter of [id number]  Basic metabolic panel  Result Value Ref Range   Sodium 137 135 - 145 mmol/L   Potassium 4.0 3.5 - 5.1 mmol/L   Chloride 102 98 - 111 mmol/L   CO2 26 22 - 32 mmol/L   Glucose, Bld 119 (H) 70 - 99 mg/dL   BUN 11 6 - 20 mg/dL   Creatinine, Ser 0.94 0.61 - 1.24 mg/dL   Calcium 9.2 8.9 - 10.3 mg/dL   GFR calc non Af Amer >60 >60 mL/min   GFR calc Af  Amer >60 >60 mL/min   Anion gap 9 5 - 15  CBC with Differential  Result Value Ref Range   WBC 5.5  4.0 - 10.5 K/uL   RBC 4.21 (L) 4.22 - 5.81 MIL/uL   Hemoglobin 13.5 13.0 - 17.0 g/dL   HCT 39.1 39.0 - 52.0 %   MCV 92.9 80.0 - 100.0 fL   MCH 32.1 26.0 - 34.0 pg   MCHC 34.5 30.0 - 36.0 g/dL   RDW 12.8 11.5 - 15.5 %   Platelets 238 150 - 400 K/uL   nRBC 0.0 0.0 - 0.2 %   Neutrophils Relative % 63 %   Neutro Abs 3.5 1.7 - 7.7 K/uL   Lymphocytes Relative 23 %   Lymphs Abs 1.3 0.7 - 4.0 K/uL   Monocytes Relative 9 %   Monocytes Absolute 0.5 0.1 - 1.0 K/uL   Eosinophils Relative 4 %   Eosinophils Absolute 0.2 0.0 - 0.5 K/uL   Basophils Relative 1 %   Basophils Absolute 0.1 0.0 - 0.1 K/uL   Immature Granulocytes 0 %   Abs Immature Granulocytes 0.02 0.00 - 0.07 K/uL   Assessment & Plan:  This visit occurred during the SARS-CoV-2 public health emergency.  Safety protocols were in place, including screening questions prior to the visit, additional usage of staff PPE, and extensive cleaning of exam room while observing appropriate contact time as indicated for disinfecting solutions.   Problem List Items Addressed This Visit    Neuropathy - Primary    Progression noted. Patient is worried about worsening numbness. Will refer to neurology for further evaluation  Discussed possible relation of neuropathy to alcohol intake - encouraged he back off alcohol use - he agrees. Given significant alcohol use, will check labwork today including thiamine, folate. Prior b12 normal.       Relevant Orders   Vitamin B12   Folate   Vitamin B1   Comprehensive metabolic panel   CBC with Differential/Platelet   TSH   Ambulatory referral to Neurology   Lower urinary tract symptoms (LUTS)    Progressively worsening over the years, eval last year WNL including UA and PSA. High IPSS previously.  Discussed next step of uro eval. He is interested. Will refer.       Relevant Orders   Ambulatory referral  to Urology       No orders of the defined types were placed in this encounter.  Orders Placed This Encounter  Procedures  . Vitamin B12  . Folate  . Vitamin B1  . Comprehensive metabolic panel  . CBC with Differential/Platelet  . TSH  . Ambulatory referral to Urology    Referral Priority:   Routine    Referral Type:   Consultation    Referral Reason:   Specialty Services Required    Requested Specialty:   Urology    Number of Visits Requested:   1  . Ambulatory referral to Neurology    Referral Priority:   Routine    Referral Type:   Consultation    Referral Reason:   Specialty Services Required    Requested Specialty:   Neurology    Number of Visits Requested:   1    Patient Instructions  Labs today to check vitamin levels.  We will refer you to urology to review further options for urination. We will refer you to neurology for further evaluation of progressive numbness of legs.  Start backing off alcohol - as this can contribute to worsening neuropathy.    Follow up plan: No follow-ups on file.  Ria Bush, MD

## 2019-12-01 NOTE — Assessment & Plan Note (Signed)
Progressively worsening over the years, eval last year WNL including UA and PSA. High IPSS previously.  Discussed next step of uro eval. He is interested. Will refer.

## 2019-12-01 NOTE — Assessment & Plan Note (Signed)
Progression noted. Patient is worried about worsening numbness. Will refer to neurology for further evaluation  Discussed possible relation of neuropathy to alcohol intake - encouraged he back off alcohol use - he agrees. Given significant alcohol use, will check labwork today including thiamine, folate. Prior b12 normal.

## 2019-12-01 NOTE — Patient Instructions (Addendum)
Labs today to check vitamin levels.  We will refer you to urology to review further options for urination. We will refer you to neurology for further evaluation of progressive numbness of legs.  Start backing off alcohol - as this can contribute to worsening neuropathy.

## 2019-12-03 ENCOUNTER — Other Ambulatory Visit: Payer: Self-pay | Admitting: Family Medicine

## 2019-12-03 MED ORDER — VITAMIN B-12 1000 MCG PO TABS: 1000.0000 ug | ORAL_TABLET | Freq: Every day | ORAL | Status: DC

## 2019-12-06 LAB — FOLATE: Folate: 11.4 ng/mL

## 2019-12-06 LAB — VITAMIN B12: Vitamin B-12: 380 pg/mL (ref 200–1100)

## 2019-12-06 LAB — CBC WITH DIFFERENTIAL/PLATELET
Absolute Monocytes: 700 cells/uL (ref 200–950)
Basophils Absolute: 39 cells/uL (ref 0–200)
Basophils Relative: 0.7 %
Eosinophils Absolute: 179 cells/uL (ref 15–500)
Eosinophils Relative: 3.2 %
HCT: 42 % (ref 38.5–50.0)
Hemoglobin: 14.2 g/dL (ref 13.2–17.1)
Lymphs Abs: 1641 cells/uL (ref 850–3900)
MCH: 32.3 pg (ref 27.0–33.0)
MCHC: 33.8 g/dL (ref 32.0–36.0)
MCV: 95.5 fL (ref 80.0–100.0)
MPV: 9.6 fL (ref 7.5–12.5)
Monocytes Relative: 12.5 %
Neutro Abs: 3041 cells/uL (ref 1500–7800)
Neutrophils Relative %: 54.3 %
Platelets: 264 10*3/uL (ref 140–400)
RBC: 4.4 10*6/uL (ref 4.20–5.80)
RDW: 13 % (ref 11.0–15.0)
Total Lymphocyte: 29.3 %
WBC: 5.6 10*3/uL (ref 3.8–10.8)

## 2019-12-06 LAB — COMPREHENSIVE METABOLIC PANEL
AG Ratio: 1.7 (calc) (ref 1.0–2.5)
ALT: 38 U/L (ref 9–46)
AST: 34 U/L (ref 10–35)
Albumin: 4.7 g/dL (ref 3.6–5.1)
Alkaline phosphatase (APISO): 58 U/L (ref 35–144)
BUN: 15 mg/dL (ref 7–25)
CO2: 24 mmol/L (ref 20–32)
Calcium: 9.9 mg/dL (ref 8.6–10.3)
Chloride: 101 mmol/L (ref 98–110)
Creat: 1.06 mg/dL (ref 0.70–1.33)
Globulin: 2.7 g/dL (calc) (ref 1.9–3.7)
Glucose, Bld: 105 mg/dL — ABNORMAL HIGH (ref 65–99)
Potassium: 4.7 mmol/L (ref 3.5–5.3)
Sodium: 136 mmol/L (ref 135–146)
Total Bilirubin: 0.4 mg/dL (ref 0.2–1.2)
Total Protein: 7.4 g/dL (ref 6.1–8.1)

## 2019-12-06 LAB — VITAMIN B1

## 2019-12-06 LAB — TSH: TSH: 1.28 mIU/L (ref 0.40–4.50)

## 2019-12-13 ENCOUNTER — Ambulatory Visit: Payer: 59 | Attending: Internal Medicine

## 2019-12-13 DIAGNOSIS — Z23 Encounter for immunization: Secondary | ICD-10-CM

## 2019-12-13 NOTE — Progress Notes (Signed)
   [id number]  Name:  Patrick Bailey.    MRN: [id number] DOB: 1961-04-25  12/13/2019  [name] was observed post Covid-19 immunization for 15 minutes without incident. He was provided with Vaccine Information Sheet and instruction to access the V-Safe system.   [name] was instructed to call 911 with any severe reactions post vaccine: Marland Kitchen Difficulty breathing  . Swelling of face and throat  . A fast heartbeat  . A bad rash all over body  . Dizziness and weakness   Immunizations Administered    Name Date Dose VIS Date Route   Pfizer COVID-19 Vaccine 12/13/2019  3:58 PM 0.3 mL 09/27/2018 Intramuscular   Manufacturer: [place]   Lot: [id number]   [place]: [id number]

## 2019-12-16 ENCOUNTER — Other Ambulatory Visit: Payer: Self-pay | Admitting: Family Medicine

## 2020-01-05 ENCOUNTER — Ambulatory Visit: Payer: 59 | Admitting: Family Medicine

## 2020-01-05 ENCOUNTER — Other Ambulatory Visit: Payer: Self-pay

## 2020-01-05 ENCOUNTER — Encounter: Payer: Self-pay | Admitting: Family Medicine

## 2020-01-05 ENCOUNTER — Other Ambulatory Visit: Payer: Self-pay | Admitting: Family Medicine

## 2020-01-05 VITALS — BP 134/82 | HR 89 | Temp 98.4°F | Ht 71.0 in | Wt 211.5 lb

## 2020-01-05 DIAGNOSIS — B001 Herpesviral vesicular dermatitis: Secondary | ICD-10-CM

## 2020-01-05 DIAGNOSIS — H6121 Impacted cerumen, right ear: Secondary | ICD-10-CM

## 2020-01-05 MED ORDER — VALACYCLOVIR HCL 1 G PO TABS: ORAL_TABLET | ORAL | 3 refills | Status: DC

## 2020-01-05 NOTE — Assessment & Plan Note (Signed)
Valtrex refilled.

## 2020-01-05 NOTE — Assessment & Plan Note (Signed)
Irrigation performed today, then manual disimpaction with plastic curette, not fully successful. Discussed home mineral oil use to soften wax.  Update if needs rpt irrigation.  Pt agrees with plan.

## 2020-01-05 NOTE — Patient Instructions (Addendum)
[facility] #: [phone] Ear irrigation performed today on right Valtrex refilled.   Earwax Buildup, Adult The ears produce a substance called earwax that helps keep bacteria out of the ear and protects the skin in the ear canal. Occasionally, earwax can build up in the ear and cause discomfort or hearing loss. What increases the risk? This condition is more likely to develop in people who:  Are male.  Are elderly.  Naturally produce more earwax.  Clean their ears often with cotton swabs.  Use earplugs often.  Use in-ear headphones often.  Wear hearing aids.  Have narrow ear canals.  Have earwax that is overly thick or sticky.  Have eczema.  Are dehydrated.  Have excess hair in the ear canal. What are the signs or symptoms? Symptoms of this condition include:  Reduced or muffled hearing.  A feeling of fullness in the ear or feeling that the ear is plugged.  Fluid coming from the ear.  Ear pain.  Ear itch.  Ringing in the ear.  Coughing.  An obvious piece of earwax that can be seen inside the ear canal. How is this diagnosed? This condition may be diagnosed based on:  Your symptoms.  Your medical history.  An ear exam. During the exam, your health care provider will look into your ear with an instrument called an otoscope. You may have tests, including a hearing test. How is this treated? This condition may be treated by:  Using ear drops to soften the earwax.  Having the earwax removed by a health care provider. The health care provider may: ? Flush the ear with water. ? Use an instrument that has a loop on the end (curette). ? Use a suction device.  Surgery to remove the wax buildup. This may be done in severe cases. Follow these instructions at home:   Take over-the-counter and prescription medicines only as told by your health care provider.  Do not put any objects, including cotton swabs, into your ear. You can clean the  opening of your ear canal with a washcloth or facial tissue.  Follow instructions from your health care provider about cleaning your ears. Do not over-clean your ears.  Drink enough fluid to keep your urine clear or pale yellow. This will help to thin the earwax.  Keep all follow-up visits as told by your health care provider. If earwax builds up in your ears often or if you use hearing aids, consider seeing your health care provider for routine, preventive ear cleanings. Ask your health care provider how often you should schedule your cleanings.  If you have hearing aids, clean them according to instructions from the manufacturer and your health care provider. Contact a health care provider if:  You have ear pain.  You develop a fever.  You have blood, pus, or other fluid coming from your ear.  You have hearing loss.  You have ringing in your ears that does not go away.  Your symptoms do not improve with treatment.  You feel like the room is spinning (vertigo). Summary  Earwax can build up in the ear and cause discomfort or hearing loss.  The most common symptoms of this condition include reduced or muffled hearing and a feeling of fullness in the ear or feeling that the ear is plugged.  This condition may be diagnosed based on your symptoms, your medical history, and an ear exam.  This condition may be treated by using ear drops to soften the earwax or  by having the earwax removed by a health care provider.  Do not put any objects, including cotton swabs, into your ear. You can clean the opening of your ear canal with a washcloth or facial tissue. This information is not intended to replace advice given to you by your health care provider. Make sure you discuss any questions you have with your health care provider. Document Revised: 07/02/2017 Document Reviewed: 09/30/2016 Elsevier Patient Education  2020 [employer].

## 2020-01-05 NOTE — Progress Notes (Signed)
This visit was conducted in person.  BP 134/82 (BP Location: Left Arm, Patient Position: Sitting, Cuff Size: Normal)   Pulse 89   Temp 98.4 F (36.9 C) (Temporal)   Ht 5\' 11"  (1.803 m)   Wt 211 lb 8 oz (95.9 kg)   SpO2 98%   BMI 29.50 kg/m    CC: check ear Subjective:    Patient ID: Patrick Ochoa., male    DOB: 1961-04-19, 59 y.o.   MRN: [id number]  HPI: Patrick Hestand. is a 59 y.o. male presenting on 01/05/2020 for Cerumen Impaction (Here for regular ear cleaning. )   3d h/o R ear muffled hearing. No draining or muffled hearing. Some tinnitus on the right. No vertigo.   Requests valtrex refilled for current cold sore - feeling paresthesias to R upper lip - this is how they start. No current rash.      Relevant past medical, surgical, family and social history reviewed and updated as indicated. Interim medical history since our last visit reviewed. Allergies and medications reviewed and updated. Outpatient Medications Prior to Visit  Medication Sig Dispense Refill  . amitriptyline (ELAVIL) 50 MG tablet TAKE 1 TABLET BY MOUTH AT BEDTIME 90 tablet 1  . amLODipine (NORVASC) 10 MG tablet Take 1 tablet by mouth once daily 90 tablet 3  . aspirin EC 81 MG tablet Take 1 tablet (81 mg total) by mouth 2 (two) times daily. For DVT prophylaxis for 30 days after surgery. 60 tablet 0  . atorvastatin (LIPITOR) 40 MG tablet Take 1 tablet by mouth once daily 90 tablet 3  . DENTAGEL 1.1 % GEL dental gel Place 1 application onto teeth at bedtime.     . docusate sodium (COLACE) 100 MG capsule Take 1 capsule (100 mg total) by mouth 2 (two) times daily. To prevent constipation while taking pain medication. 30 capsule 0  . EUTHYROX 150 MCG tablet Take 1 tablet by mouth once daily 30 tablet 11  . fenofibrate 160 MG tablet Take 1 tablet (160 mg total) by mouth daily. 90 tablet 3  . metoprolol tartrate (LOPRESSOR) 100 MG tablet Take 1 tablet by mouth twice daily 180 tablet 3  . MITIGARE 0.6 MG  CAPS TAKE 1 CAPSULE BY MOUTH IN THE MORNING AS NEEDED 90 capsule 0  . Omega-3 Fatty Acids (FISH OIL) 1000 MG CAPS Take 2 capsules (2,000 mg total) by mouth 2 (two) times a day.  0  . omeprazole (PRILOSEC) 20 MG capsule Take 20 mg by mouth daily.    . vitamin B-12 (CYANOCOBALAMIN) 1000 MCG tablet Take 1 tablet (1,000 mcg total) by mouth daily.    . valACYclovir (VALTREX) 1000 MG tablet TAKE 2 TABLETS BY MOUTH TWICE DAILY ONE  TIME  PER  EPISODE 20 tablet 3   No facility-administered medications prior to visit.     Per HPI unless specifically indicated in ROS section below Review of Systems Objective:  BP 134/82 (BP Location: Left Arm, Patient Position: Sitting, Cuff Size: Normal)   Pulse 89   Temp 98.4 F (36.9 C) (Temporal)   Ht 5\' 11"  (1.803 m)   Wt 211 lb 8 oz (95.9 kg)   SpO2 98%   BMI 29.50 kg/m   Wt Readings from Last 3 Encounters:  01/05/20 211 lb 8 oz (95.9 kg)  12/01/19 208 lb 5 oz (94.5 kg)  06/14/19 200 lb (90.7 kg)      Physical Exam Vitals and nursing note reviewed.  Constitutional:  Appearance: Normal appearance. He is not ill-appearing.  HENT:     Head: Normocephalic and atraumatic.     Right Ear: External ear normal. There is impacted cerumen.     Left Ear: Tympanic membrane, ear canal and external ear normal. There is no impacted cerumen.     Ears:     Comments: Irrigation then manual disimpaction performed, without full clearing    Mouth/Throat:     Comments: No blister on lips  Neurological:     Mental Status: He is alert.  Psychiatric:        Mood and Affect: Mood normal.        Behavior: Behavior normal.       Assessment & Plan:  This visit occurred during the SARS-CoV-2 public health emergency.  Safety protocols were in place, including screening questions prior to the visit, additional usage of staff PPE, and extensive cleaning of exam room while observing appropriate contact time as indicated for disinfecting solutions.   Problem List Items  Addressed This Visit    Herpes labialis    Valtrex refilled.       Relevant Medications   valACYclovir (VALTREX) 1000 MG tablet   Hearing loss secondary to cerumen impaction, right - Primary    Irrigation performed today, then manual disimpaction with plastic curette, not fully successful. Discussed home mineral oil use to soften wax.  Update if needs rpt irrigation.  Pt agrees with plan.           Meds ordered this encounter  Medications  . valACYclovir (VALTREX) 1000 MG tablet    Sig: TAKE 2 TABLETS BY MOUTH TWICE DAILY ONE  TIME  PER  EPISODE    Dispense:  20 tablet    Refill:  3   No orders of the defined types were placed in this encounter.   Patient instructions: [facility] #: [phone] Ear irrigation performed today on right Valtrex refilled.   Follow up plan: Return if symptoms worsen or fail to improve.  Ria Bush, MD

## 2020-01-11 ENCOUNTER — Inpatient Hospital Stay
Admission: EM | Admit: 2020-01-11 | Discharge: 2020-01-12 | DRG: 123 | Disposition: A | Discharge status: Home / Self Care | Payer: 59 | Source: Ambulatory Visit | Attending: Hospitalist | Admitting: Hospitalist

## 2020-01-11 ENCOUNTER — Other Ambulatory Visit: Payer: Self-pay

## 2020-01-11 ENCOUNTER — Inpatient Hospital Stay: Payer: 59

## 2020-01-11 ENCOUNTER — Emergency Department: Payer: 59

## 2020-01-11 DIAGNOSIS — Z8249 Family history of ischemic heart disease and other diseases of the circulatory system: Secondary | ICD-10-CM

## 2020-01-11 DIAGNOSIS — E785 Hyperlipidemia, unspecified: Secondary | ICD-10-CM | POA: Diagnosis present

## 2020-01-11 DIAGNOSIS — Z7982 Long term (current) use of aspirin: Secondary | ICD-10-CM

## 2020-01-11 DIAGNOSIS — Z9221 Personal history of antineoplastic chemotherapy: Secondary | ICD-10-CM | POA: Diagnosis not present

## 2020-01-11 DIAGNOSIS — E781 Pure hyperglyceridemia: Secondary | ICD-10-CM | POA: Diagnosis present

## 2020-01-11 DIAGNOSIS — Z96653 Presence of artificial knee joint, bilateral: Secondary | ICD-10-CM | POA: Diagnosis present

## 2020-01-11 DIAGNOSIS — I639 Cerebral infarction, unspecified: Secondary | ICD-10-CM | POA: Diagnosis present

## 2020-01-11 DIAGNOSIS — Z87891 Personal history of nicotine dependence: Secondary | ICD-10-CM

## 2020-01-11 DIAGNOSIS — K219 Gastro-esophageal reflux disease without esophagitis: Secondary | ICD-10-CM | POA: Diagnosis present

## 2020-01-11 DIAGNOSIS — H3411 Central retinal artery occlusion, right eye: Principal | ICD-10-CM | POA: Diagnosis present

## 2020-01-11 DIAGNOSIS — E039 Hypothyroidism, unspecified: Secondary | ICD-10-CM | POA: Diagnosis present

## 2020-01-11 DIAGNOSIS — Z85819 Personal history of malignant neoplasm of unspecified site of lip, oral cavity, and pharynx: Secondary | ICD-10-CM | POA: Diagnosis not present

## 2020-01-11 DIAGNOSIS — Z823 Family history of stroke: Secondary | ICD-10-CM

## 2020-01-11 DIAGNOSIS — Z8673 Personal history of transient ischemic attack (TIA), and cerebral infarction without residual deficits: Secondary | ICD-10-CM

## 2020-01-11 DIAGNOSIS — R29701 NIHSS score 1: Secondary | ICD-10-CM | POA: Diagnosis present

## 2020-01-11 DIAGNOSIS — M109 Gout, unspecified: Secondary | ICD-10-CM | POA: Diagnosis present

## 2020-01-11 DIAGNOSIS — Z923 Personal history of irradiation: Secondary | ICD-10-CM

## 2020-01-11 DIAGNOSIS — Z20822 Contact with and (suspected) exposure to covid-19: Secondary | ICD-10-CM | POA: Diagnosis present

## 2020-01-11 DIAGNOSIS — H539 Unspecified visual disturbance: Secondary | ICD-10-CM

## 2020-01-11 DIAGNOSIS — I10 Essential (primary) hypertension: Secondary | ICD-10-CM | POA: Diagnosis present

## 2020-01-11 DIAGNOSIS — I2510 Atherosclerotic heart disease of native coronary artery without angina pectoris: Secondary | ICD-10-CM | POA: Diagnosis present

## 2020-01-11 LAB — CBC
HCT: 40.8 % (ref 39.0–52.0)
Hemoglobin: 14.5 g/dL (ref 13.0–17.0)
MCH: 33.5 pg (ref 26.0–34.0)
MCHC: 35.5 g/dL (ref 30.0–36.0)
MCV: 94.2 fL (ref 80.0–100.0)
Platelets: 242 10*3/uL (ref 150–400)
RBC: 4.33 MIL/uL (ref 4.22–5.81)
RDW: 12.5 % (ref 11.5–15.5)
WBC: 6.2 10*3/uL (ref 4.0–10.5)
nRBC: 0 % (ref 0.0–0.2)

## 2020-01-11 LAB — COMPREHENSIVE METABOLIC PANEL
ALT: 44 U/L (ref 0–44)
AST: 45 U/L — ABNORMAL HIGH (ref 15–41)
Albumin: 4.8 g/dL (ref 3.5–5.0)
Alkaline Phosphatase: 66 U/L (ref 38–126)
Anion gap: 10 (ref 5–15)
BUN: 14 mg/dL (ref 6–20)
CO2: 26 mmol/L (ref 22–32)
Calcium: 9.6 mg/dL (ref 8.9–10.3)
Chloride: 100 mmol/L (ref 98–111)
Creatinine, Ser: 1.14 mg/dL (ref 0.61–1.24)
GFR calc Af Amer: >60 mL/min (ref >60)
GFR calc non Af Amer: >60 mL/min (ref >60)
Glucose, Bld: 105 mg/dL — ABNORMAL HIGH (ref 70–99)
Potassium: 4.3 mmol/L (ref 3.5–5.1)
Sodium: 136 mmol/L (ref 135–145)
Total Bilirubin: 0.9 mg/dL (ref 0.3–1.2)
Total Protein: 8.4 g/dL — ABNORMAL HIGH (ref 6.5–8.1)

## 2020-01-11 LAB — DIFFERENTIAL
Abs Immature Granulocytes: 0.02 10*3/uL (ref 0.00–0.07)
Basophils Absolute: 0.1 10*3/uL (ref 0.0–0.1)
Basophils Relative: 1 %
Eosinophils Absolute: 0.2 10*3/uL (ref 0.0–0.5)
Eosinophils Relative: 4 %
Immature Granulocytes: 0 %
Lymphocytes Relative: 27 %
Lymphs Abs: 1.7 10*3/uL (ref 0.7–4.0)
Monocytes Absolute: 0.6 10*3/uL (ref 0.1–1.0)
Monocytes Relative: 10 %
Neutro Abs: 3.6 10*3/uL (ref 1.7–7.7)
Neutrophils Relative %: 58 %

## 2020-01-11 LAB — SARS CORONAVIRUS 2 BY RT PCR (HOSPITAL ORDER, PERFORMED IN ~~LOC~~ HOSPITAL LAB): SARS Coronavirus 2: NEGATIVE

## 2020-01-11 LAB — PROTIME-INR
INR: 1 (ref 0.8–1.2)
Prothrombin Time: 12.9 seconds (ref 11.4–15.2)

## 2020-01-11 LAB — GLUCOSE, CAPILLARY: Glucose-Capillary: 106 mg/dL — ABNORMAL HIGH (ref 70–99)

## 2020-01-11 LAB — APTT: aPTT: 29 seconds (ref 24–36)

## 2020-01-11 MED ORDER — CLOPIDOGREL BISULFATE 75 MG PO TABS
75.0000 mg | ORAL_TABLET | Freq: Every day | ORAL | Status: DC
  Administered 2020-01-12: 75 mg via ORAL
  Filled 2020-01-11: qty 1

## 2020-01-11 MED ORDER — ATORVASTATIN CALCIUM 20 MG PO TABS
40.0000 mg | ORAL_TABLET | Freq: Every day | ORAL | Status: DC
  Administered 2020-01-12: 40 mg via ORAL
  Filled 2020-01-11: qty 2

## 2020-01-11 MED ORDER — METOPROLOL TARTRATE 50 MG PO TABS
100.0000 mg | ORAL_TABLET | Freq: Two times a day (BID) | ORAL | Status: DC
  Administered 2020-01-11 – 2020-01-12 (×2): 100 mg via ORAL
  Filled 2020-01-11 (×2): qty 2

## 2020-01-11 MED ORDER — HEPARIN SODIUM (PORCINE) 5000 UNIT/ML IJ SOLN
5000.0000 [IU] | Freq: Three times a day (TID) | INTRAMUSCULAR | Status: DC
  Administered 2020-01-11 – 2020-01-12 (×2): 5000 [IU] via SUBCUTANEOUS
  Filled 2020-01-11 (×2): qty 1

## 2020-01-11 MED ORDER — STROKE: EARLY STAGES OF RECOVERY BOOK: Freq: Once | Status: DC

## 2020-01-11 MED ORDER — AMITRIPTYLINE HCL 25 MG PO TABS
50.0000 mg | ORAL_TABLET | Freq: Every day | ORAL | Status: DC
  Administered 2020-01-11: 50 mg via ORAL
  Filled 2020-01-11: qty 1

## 2020-01-11 MED ORDER — OMEGA-3-ACID ETHYL ESTERS 1 G PO CAPS
2.0000 g | ORAL_CAPSULE | Freq: Every day | ORAL | Status: DC
  Administered 2020-01-12: 2 g via ORAL
  Filled 2020-01-11: qty 2

## 2020-01-11 MED ORDER — VITAMIN B-12 1000 MCG PO TABS
1000.0000 ug | ORAL_TABLET | Freq: Every day | ORAL | Status: DC
  Administered 2020-01-12: 1000 ug via ORAL
  Filled 2020-01-11: qty 1

## 2020-01-11 MED ORDER — ACETAMINOPHEN 650 MG RE SUPP: 650.0000 mg | RECTAL | Status: DC | PRN

## 2020-01-11 MED ORDER — ASPIRIN EC 81 MG PO TBEC
81.0000 mg | DELAYED_RELEASE_TABLET | Freq: Two times a day (BID) | ORAL | Status: DC
  Administered 2020-01-12: 81 mg via ORAL
  Filled 2020-01-11: qty 1

## 2020-01-11 MED ORDER — FENOFIBRATE 160 MG PO TABS
160.0000 mg | ORAL_TABLET | Freq: Every day | ORAL | Status: DC
  Administered 2020-01-12: 160 mg via ORAL
  Filled 2020-01-11 (×2): qty 1

## 2020-01-11 MED ORDER — AMLODIPINE BESYLATE 10 MG PO TABS
10.0000 mg | ORAL_TABLET | Freq: Every day | ORAL | Status: DC
  Administered 2020-01-12: 10 mg via ORAL
  Filled 2020-01-11: qty 1

## 2020-01-11 MED ORDER — SENNOSIDES-DOCUSATE SODIUM 8.6-50 MG PO TABS: 1.0000 | ORAL_TABLET | Freq: Every evening | ORAL | Status: DC | PRN

## 2020-01-11 MED ORDER — ACETAMINOPHEN 325 MG PO TABS: 650.0000 mg | ORAL_TABLET | ORAL | Status: DC | PRN

## 2020-01-11 MED ORDER — LEVOTHYROXINE SODIUM 50 MCG PO TABS
150.0000 ug | ORAL_TABLET | Freq: Every day | ORAL | Status: DC
  Administered 2020-01-12: 150 ug via ORAL
  Filled 2020-01-11: qty 1

## 2020-01-11 MED ORDER — ACETAMINOPHEN 160 MG/5ML PO SOLN
650.0000 mg | ORAL | Status: DC | PRN
  Filled 2020-01-11: qty 20.3

## 2020-01-11 MED ORDER — PANTOPRAZOLE SODIUM 40 MG PO TBEC
40.0000 mg | DELAYED_RELEASE_TABLET | Freq: Every day | ORAL | Status: DC
  Administered 2020-01-12: 40 mg via ORAL
  Filled 2020-01-11: qty 1

## 2020-01-11 MED ORDER — SODIUM CHLORIDE 0.9% FLUSH: 3.0000 mL | Freq: Once | INTRAVENOUS | Status: DC

## 2020-01-11 MED ORDER — VALACYCLOVIR HCL 500 MG PO TABS
1000.0000 mg | ORAL_TABLET | Freq: Two times a day (BID) | ORAL | Status: DC
  Administered 2020-01-11 – 2020-01-12 (×2): 1000 mg via ORAL
  Filled 2020-01-11 (×3): qty 2

## 2020-01-11 MED ORDER — DOCUSATE SODIUM 100 MG PO CAPS
100.0000 mg | ORAL_CAPSULE | Freq: Two times a day (BID) | ORAL | Status: DC
  Filled 2020-01-11: qty 1

## 2020-01-11 NOTE — ED Notes (Signed)
EDP at bedside  

## 2020-01-11 NOTE — Progress Notes (Signed)
°   01/11/20 1330  Clinical Encounter Type  Visited With Patient and family together  Visit Type Initial  Referral From Nurse  Consult/Referral To Chaplain  Chaplain responded to a code stroke and visit with patient. Patient said that his eye doctor told him that he had a stroke in his eye. Doctor on virtual connection said that she needed the eye doctor's telephone number. Chaplain went to waiting room to locate father, but discovered that he was in his truck. He gave chaplain a card with the information. Chaplain told her when staff said he could come in that she would come back and get him. Doctors further explained that he had a blood clot in his eye and patient was a little uneasy when they could not guarantee that his vision would get back to normal. Patient asked what about work? He also mentioned that he enjoys playing pool and he was concerned about being about to still do that. Patient mentioned that he just buried his mother about two months ago. She had a stroke and never recovered. Chaplain had prayer for patient and his father. After leaving chaplain checked in on patient several times.

## 2020-01-11 NOTE — Consult Note (Signed)
[facility] Consult Services   Date of Service:   01/11/2020 16:15:51  Impression:     .  H34.1 - Central retinal artery occlusion  Comments/Sign-Out: the patient clearly has a right eye altitudinal defect and has definitive changes per his eye doctor of central retinal artery occlusion all of which fits together. There is no indication for alteplase with this diagnosis unfortunately do recommend stroke workup and inpatient neurology consultation. He is already on aspirin would start Plavix 75 mg of aspirin 81 cotherapy  Metrics: Last Known Well: 01/11/2020 12:30:00 [facility] Notification Time: 01/11/2020 16:15:50 Arrival Time: 01/11/2020 15:59:00 Stamp Time: 01/11/2020 16:15:51 Telephone Response Time: 01/11/2020 16:23:00 Time First Login Attempt: 01/11/2020 16:23:00 Symptoms: right eye vision changes NIHSS Start Assessment Time: 01/11/2020 16:31:00 Patient is not a candidate for Alteplase/Activase. Alteplase Medical Decision: 01/11/2020 16:42:35 Patient was not deemed candidate for Alteplase/Activase thrombolytics because of following reasons: the patient has CRAO not ischemic brain stroke alteplase not indicated.  CT head showed no acute hemorrhage or acute core infarct.  Radiologist was not called back for review of advanced imaging because na ED Physician notified of diagnostic impression and management plan on 01/11/2020 16:46:36  Advanced Imaging: Advanced Imaging Not Recommended because:  Clinical Presentation is not Suggestive of LVO and NIHSS is <6  Patient being Transferred to another Facility for Advanced Imaging   Our recommendations are outlined below.  Recommendations:     .  Activate Stroke Protocol Admission/Order Set     .  Stroke/Telemetry Floor     .  Neuro Checks     .  Bedside Swallow Eval     .  DVT Prophylaxis     .  IV Fluids, Normal Saline     .  Head of Bed 30 Degrees     .  Euglycemia and Avoid Hyperthermia (PRN  Acetaminophen)     .  Initiate Aspirin 81 MG Daily     .  Initiate Plavix 75 MG Daily  Routine Consultation with [facility] for Follow up Care  Sign Out:     .  Discussed with Emergency Department Provider    ------------------------------------------------------------------------------  History of Present Illness: Patient is a 59 year old Male.  Patient was brought by EMS for symptoms of right eye vision changes  the patient is a 59 year old man with a history of tobacco use, or cancer information, hypertension, hyperlipidemia, coronary artery disease. He came from the eye doctor with a retinal artery occlusion. he says that at 12:30 he began to develop right eye vision blurriness where he cannot see the upper part of the vision in the right eye only. He denies any other weakness numbness or tingling. he takes aspirin daily   Past Medical History:     . Hypertension     . Hyperlipidemia     . Coronary Artery Disease     . Stroke     . There is NO history of Diabetes Mellitus     . There is NO history of Atrial Fibrillation  Anticoagulant use:  No  Antiplatelet use: asa daily    Examination: BP(174/94), Pulse(85), Blood Glucose(106) 1A: Level of Consciousness - Alert; keenly responsive + 0 1B: Ask Month and Age - Both Questions Right + 0 1C: Blink Eyes & Squeeze Hands - Performs Both Tasks + 0 2: Test Horizontal Extraocular Movements - Normal + 0 3: Test Visual Fields - he has a rigth eye isolated altitudinal defect =0 4: Test Facial Palsy (Use Grimace  if Obtunded) - Normal symmetry + 0 5A: Test Left Arm Motor Drift - No Drift for 10 Seconds + 0 5B: Test Right Arm Motor Drift - No Drift for 10 Seconds + 0 6A: Test Left Leg Motor Drift - No Drift for 5 Seconds + 0 6B: Test Right Leg Motor Drift - No Drift for 5 Seconds + 0 7: Test Limb Ataxia (FNF/Heel-Shin) - No Ataxia + 0 8: Test Sensation - Normal; No sensory loss + 0 9: Test Language/Aphasia - Normal; No  aphasia + 0 10: Test Dysarthria - Normal + 0 11: Test Extinction/Inattention - No abnormality + 0  NIHSS Score: 0  Pre-Morbid Modified Ranking Scale: 0 Points = No symptoms at all   Patient/Family was informed the Neurology Consult would occur via TeleHealth consult by way of interactive audio and video telecommunications and consented to receiving care in this manner.   Patient is being evaluated for possible acute neurologic impairment and high probability of imminent or life-threatening deterioration. I spent total of 35 minutes providing care to this patient, including time for face to face visit via telemedicine, review of medical records, imaging studies and discussion of findings with providers, the patient and/or family.   Dr Katina Degree   [facility] [phone]  Case [id number]

## 2020-01-11 NOTE — ED Notes (Signed)
CT called and room 1 for pt

## 2020-01-11 NOTE — ED Triage Notes (Addendum)
LKW 1:30-2:00 with Retinal Artery Occlusion with vision lost per Eye Doctor.  Pt states LKW was at 1:30-2:00 and began to have vision issues.  Pt states right eye vision loss. Pt states some unsteadyness and balance issues.  Pt arrives alert but with vision disturbances.  [facility] Stroke called

## 2020-01-11 NOTE — ED Notes (Signed)
Called [phone] for code stroke 1603

## 2020-01-11 NOTE — ED Notes (Signed)
teleneuro RN @ bedside
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
## 2020-01-11 NOTE — ED Notes (Signed)
RN Chrissy called and notified that pt will be coming after CT scan

## 2020-01-11 NOTE — ED Notes (Signed)
Pt given dinner tray at this time. This RN warmed up patient's food. Pt requested shasta cola, this RN gave to patient with fresh ice. Pt denies further needs, will continue to monitor.

## 2020-01-11 NOTE — ED Notes (Signed)
Tele neurologist at bedside at this time.
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
## 2020-01-11 NOTE — ED Notes (Signed)
Decision made to not give TPA at this time.

## 2020-01-12 ENCOUNTER — Inpatient Hospital Stay: Payer: 59

## 2020-01-12 ENCOUNTER — Inpatient Hospital Stay
Admit: 2020-01-12 | Discharge: 2020-01-12 | Disposition: A | Discharge status: Home / Self Care | Payer: 59 | Attending: Internal Medicine | Admitting: Internal Medicine

## 2020-01-12 ENCOUNTER — Ambulatory Visit: Payer: 59 | Admitting: Family Medicine

## 2020-01-12 DIAGNOSIS — I639 Cerebral infarction, unspecified: Secondary | ICD-10-CM

## 2020-01-12 LAB — LIPID PANEL
Cholesterol: 156 mg/dL (ref 0–200)
HDL: 34 mg/dL — ABNORMAL LOW (ref >40)
LDL Cholesterol: UNDETERMINED mg/dL (ref 0–99)
Total CHOL/HDL Ratio: 4.6 RATIO
Triglycerides: 480 mg/dL — ABNORMAL HIGH (ref <150)
VLDL: UNDETERMINED mg/dL (ref 0–40)

## 2020-01-12 LAB — LDL CHOLESTEROL, DIRECT: Direct LDL: 72.1 mg/dL (ref 0–99)

## 2020-01-12 LAB — HIV ANTIBODY (ROUTINE TESTING W REFLEX): HIV Screen 4th Generation wRfx: NONREACTIVE

## 2020-01-12 LAB — HEMOGLOBIN A1C
Hgb A1c MFr Bld: 5.6 % (ref 4.8–5.6)
Mean Plasma Glucose: 114.02 mg/dL

## 2020-01-12 LAB — ECHOCARDIOGRAM COMPLETE: Weight: 3365.1 oz

## 2020-01-12 MED ORDER — VALACYCLOVIR HCL 1 G PO TABS: 2000.0000 mg | ORAL_TABLET | Freq: Two times a day (BID) | ORAL | Status: DC

## 2020-01-12 MED ORDER — MITIGARE 0.6 MG PO CAPS: 0.6000 mg | ORAL_CAPSULE | Freq: Every day | ORAL | Status: DC | PRN

## 2020-01-12 MED ORDER — STROKE: EARLY STAGES OF RECOVERY BOOK: 1.0000 | Freq: Once | Status: AC

## 2020-01-12 MED ORDER — CLOPIDOGREL BISULFATE 75 MG PO TABS: 75.0000 mg | ORAL_TABLET | Freq: Every day | ORAL | 2 refills | Status: DC

## 2020-01-12 MED ORDER — ASPIRIN EC 81 MG PO TBEC
81.0000 mg | DELAYED_RELEASE_TABLET | Freq: Every day | ORAL | 0 refills | Status: AC
Start: 2020-01-12 — End: 2020-02-02

## 2020-01-12 NOTE — Discharge Summary (Addendum)
Physician Discharge Summary   Patrick Bailey.  male DOB: [birth year]  OMV:[id number]  PCP: Ria Bush, MD  Admit date: 01/11/2020 Discharge date: 01/12/2020  Admitted From: home Disposition:  home CODE STATUS: Full code  Discharge Instructions    Discharge instructions   Complete by: As directed    Neurologist recommends taking aspirin 81 mg and plavix 75 mg daily together for 3 weeks, and then after that, just Plavix 75 mg daily alone.   Dr. Enzo Bi [facility] Course:  For full details, please see H&P, progress notes, consult notes and ancillary notes.  Briefly,  Patrick Bailey. is a 59 y.o. Caucasian male with medical history significant of CVA, ETOH use, HTN, GERD, Gout, hypothyroidism, throat cancer in remission s/p xrt/chemo who presented from eye doctor with diagnosis of retinal artery occlusion. Per patient around 1230 he noted acute decrease in central vision of his right eye that he described as a blurry central spot.   CVA due to R central artery occlusion Tele neuro followed by inpatient neuro consult.  MRI brain showed no acute finding.  US carotid showed no significant stenosis.  TTE showed LVEF 70-75%, restrictive diastolic function, no atrial level shunt.  Neuro recommended taking aspirin 81 mg and plavix 75 mg daily for 3 weeks, and then after that, just Plavix 75 mg daily alone.  Pt continued on home statin, fenofibrate, and fish oil.  OT recommended vision rehab which was ordered prior to discharge.  Consider prolonged cardiac monitoring on an outpatient basis.   HLD Hx of Hypertriglyceridemia  Triglycerides 480, Direct LDL 72.  Pt continued on home statin, fenofibrate, and fish oil.  ETOH use Drinks 5 or more beers daily.  Denied withdrawal symptoms.  HTN continude amlodipine, metoprolol   GERD continued ppi  Hx of Gout no current flare   hypothyroidism Continued home Euthyrox   Hx of Throat cancer  in remission  s/p xrt/chemo    Discharge Diagnoses:  Active Problems:   CVA (cerebral vascular accident) [facility])    Discharge Instructions:  Allergies as of 01/12/2020   No Known Allergies     Medication List    TAKE these medications    stroke: mapping our early stages of recovery book Misc 1 each by Does not apply route once for 1 dose.   amitriptyline 50 MG tablet Commonly known as: ELAVIL TAKE 1 TABLET BY MOUTH AT BEDTIME   amLODipine 10 MG tablet Commonly known as: NORVASC Take 1 tablet by mouth once daily What changed: additional instructions   aspirin EC 81 MG tablet Take 1 tablet (81 mg total) by mouth daily for 21 days. What changed:   when to take this  additional instructions   atorvastatin 40 MG tablet Commonly known as: LIPITOR Take 1 tablet by mouth once daily   clopidogrel 75 MG tablet Commonly known as: PLAVIX Take 1 tablet (75 mg total) by mouth daily. New blood thinner. Start taking on: January 13, 2020   DentaGel 1.1 % Gel dental gel Generic drug: sodium fluoride Place 1 application onto teeth at bedtime.   docusate sodium 100 MG capsule Commonly known as: Colace Take 1 capsule (100 mg total) by mouth 2 (two) times daily. To prevent constipation while taking pain medication.   Euthyrox 150 MCG tablet Generic drug: levothyroxine Take 1 tablet by mouth once daily What changed:   how much to take  when to take this  fenofibrate 160 MG tablet Take 1 tablet (160 mg total) by mouth daily.   Fish Oil 1000 MG Caps Take 2 capsules (2,000 mg total) by mouth 2 (two) times a day.   metoprolol tartrate 100 MG tablet Commonly known as: LOPRESSOR Take 1 tablet by mouth twice daily   Mitigare 0.6 MG Caps Generic drug: Colchicine Take 0.6 mg by mouth daily as needed (gout flare). TAKE 1 CAPSULE BY MOUTH IN THE MORNING AS NEEDED   omeprazole 20 MG capsule Commonly known as: PRILOSEC Take 20 mg by mouth daily.   valACYclovir 1000 MG tablet Commonly  known as: VALTREX Take 2 tablets (2,000 mg total) by mouth 2 (two) times daily. TAKE 2 TABLETS BY MOUTH TWICE DAILY ONE  TIME  PER  EPISODE   vitamin B-12 1000 MCG tablet Commonly known as: CYANOCOBALAMIN Take 1 tablet (1,000 mcg total) by mouth daily.        Follow-up Information    Ria Bush, MD. Schedule an appointment as soon as possible for a visit in 1 week(s).   Specialty: Family Medicine Contact information: [place] [address] [phone]               No Known Allergies   The results of significant diagnostics from this hospitalization (including imaging, microbiology, ancillary and laboratory) are listed below for reference.   Consultations:   Procedures/Studies: DG Chest 2 View  Result Date: 01/11/2020 CLINICAL DATA:  Right eye vision loss, right artery occlusion, loss of balance EXAM: CHEST - 2 VIEW COMPARISON:  01/06/2019 FINDINGS: Frontal and lateral views of the chest demonstrate an unremarkable cardiac silhouette. No acute airspace disease, effusion, or pneumothorax. No acute bony abnormalities. IMPRESSION: 1. No acute intrathoracic process. Electronically Signed   By: Randa Ngo M.D.   On: 01/11/2020 20:08   MR BRAIN WO CONTRAST  Result Date: 01/11/2020 CLINICAL DATA:  59 year old male with acute onset loss of right eye vision. Suspected retinal artery occlusion on eye exam. EXAM: MRI HEAD WITHOUT CONTRAST TECHNIQUE: Multiplanar, multiecho pulse sequences of the brain and surrounding structures were obtained without intravenous contrast. COMPARISON:  Head CT 1613 hours today.  Brain MRI 10/20/2017. FINDINGS: Brain: No restricted diffusion to suggest acute infarction. No midline shift, mass effect, evidence of mass lesion, ventriculomegaly, extra-axial collection or acute intracranial hemorrhage. Cervicomedullary junction and pituitary are within normal limits. Scattered small and patchy areas of cerebral white matter T2  and FLAIR hyperintensity have not significantly changed since 2019. Some of these most resemble chronic lacunar infarcts (left periatrial white matter series 15, images 31 and 32). No cortical encephalomalacia identified. No chronic cerebral blood products. Deep gray nuclei, brainstem and cerebellum appear negative. Vascular: Major intracranial vascular flow voids are stable since 2019 with dominant left vertebral artery suspected. Skull and upper cervical spine: Stable and negative. Sinuses/Orbits: Orbits soft tissues remain symmetric and negative. Regressed paranasal sinus mucosal thickening since 2019. Other: Trace chronic right mastoid effusion. Grossly normal visible internal auditory structures. Evidence of previous partial right parotidectomy, stable. Other scalp and face soft tissues appear negative. IMPRESSION: 1. No acute intracranial abnormality. 2. Essentially stable noncontrast MRI appearance of the brain since 2019 with mild to moderate for age white matter changes, nonspecific but most commonly due to chronic small vessel disease. Electronically Signed   By: Genevie Ann M.D.   On: 01/11/2020 21:46   CT HEAD CODE STROKE WO CONTRAST  Addendum Date: 01/11/2020   ADDENDUM REPORT: 01/11/2020 16:34 ADDENDUM: Crist Fat  discussed by telephone with Dr. Nance Pear on 01/11/2020 at 1627 hours. Electronically Signed   By: Genevie Ann M.D.   On: 01/11/2020 16:34   Result Date: 01/11/2020 CLINICAL DATA:  Code stroke. 59 year old male with right eye vision loss at 1330 hours. EXAM: CT HEAD WITHOUT CONTRAST TECHNIQUE: Contiguous axial images were obtained from the base of the skull through the vertex without intravenous contrast. COMPARISON:  Brain MRI and CTA head and neck 07/11/2016. FINDINGS: Brain: Cerebral volume is stable since 2017 and within normal limits for age. No midline shift, mass effect, or evidence of intracranial mass lesion. No ventriculomegaly. No acute intracranial hemorrhage identified. Gray-white  matter differentiation is stable and largely normal for age; mild Peri atrial white matter hypodensity. No cortically based acute infarct identified. No cortical encephalomalacia identified. Vascular: Calcified atherosclerosis at the skull base. No suspicious intracranial vascular hyperdensity. Skull: Chronic nasal bone fractures. No acute osseous abnormality identified. Sinuses/Orbits: Stable paranasal sinuses since 2017 with mild mucosal thickening. Tympanic cavities and mastoids remain well pneumatized. Other: Visualized orbits and scalp soft tissues are within normal limits. ASPECTS [facility] Stroke Program Early CT Score) Total score (0-10 with 10 being normal): 10 IMPRESSION: 1. Stable brain since 2017.  Mild for age white matter changes. 2. No acute cortically based infarct or acute intracranial hemorrhage identified. ASPECTS 10. Electronically Signed: By: Genevie Ann M.D. On: 01/11/2020 16:22      Labs: BNP (last 3 results) No results for input(s): BNP in the last 8760 hours. Basic Metabolic Panel: Recent Labs  Lab 01/11/20 1609  NA 136  K 4.3  CL 100  CO2 26  GLUCOSE 105*  BUN 14  CREATININE 1.14  CALCIUM 9.6   Liver Function Tests: Recent Labs  Lab 01/11/20 1609  AST 45*  ALT 44  ALKPHOS 66  BILITOT 0.9  PROT 8.4*  ALBUMIN 4.8   No results for input(s): LIPASE, AMYLASE in the last 168 hours. No results for input(s): AMMONIA in the last 168 hours. CBC: Recent Labs  Lab 01/11/20 1609  WBC 6.2  NEUTROABS 3.6  HGB 14.5  HCT 40.8  MCV 94.2  PLT 242   Cardiac Enzymes: No results for input(s): CKTOTAL, CKMB, CKMBINDEX, TROPONINI in the last 168 hours. BNP: Invalid input(s): POCBNP CBG: Recent Labs  Lab 01/11/20 1606  GLUCAP 106*   D-Dimer No results for input(s): DDIMER in the last 72 hours. Hgb A1c Recent Labs    01/12/20 0419  HGBA1C 5.6   Lipid Profile Recent Labs    01/12/20 0419  CHOL 156  HDL 34*  LDLCALC UNABLE TO CALCULATE IF TRIGLYCERIDE  OVER 400 mg/dL  TRIG 480*  CHOLHDL 4.6  LDLDIRECT 72.1   Thyroid function studies No results for input(s): TSH, T4TOTAL, T3FREE, THYROIDAB in the last 72 hours.  Invalid input(s): FREET3 Anemia work up No results for input(s): VITAMINB12, FOLATE, FERRITIN, TIBC, IRON, RETICCTPCT in the last 72 hours. Urinalysis    Component Value Date/Time   COLORURINE YELLOW 05/26/2016 0730   APPEARANCEUR CLEAR 05/26/2016 0730   LABSPEC 1.014 05/26/2016 0730   PHURINE 6.0 05/26/2016 0730   GLUCOSEU NEGATIVE 05/26/2016 0730   HGBUR NEGATIVE 05/26/2016 0730   BILIRUBINUR negative 04/07/2019 1643   KETONESUR NEGATIVE 05/26/2016 0730   PROTEINUR Negative 04/07/2019 1643   PROTEINUR NEGATIVE 05/26/2016 0730   UROBILINOGEN 0.2 04/07/2019 1643   NITRITE negative 04/07/2019 1643   NITRITE NEGATIVE 05/26/2016 0730   LEUKOCYTESUR Negative 04/07/2019 1643   Sepsis Labs Invalid input(s): PROCALCITONIN,  WBC,  LACTICIDVEN Microbiology Recent Results (from the past 240 hour(s))  SARS Coronavirus 2 by RT PCR (hospital order, performed in [facility] hospital lab) Nasopharyngeal Nasopharyngeal Swab     Status: None   Collection Time: 01/11/20  4:56 PM   Specimen: Nasopharyngeal Swab  Result Value Ref Range Status   SARS Coronavirus 2 NEGATIVE NEGATIVE Final    Comment: (NOTE) SARS-CoV-2 target nucleic acids are NOT DETECTED.  The SARS-CoV-2 RNA is generally detectable in upper and lower respiratory specimens during the acute phase of infection. The lowest concentration of SARS-CoV-2 viral copies this assay can detect is 250 copies / mL. A negative result does not preclude SARS-CoV-2 infection and should not be used as the sole basis for treatment or other patient management decisions.  A negative result may occur with improper specimen collection / handling, submission of specimen other than nasopharyngeal swab, presence of viral mutation(s) within the areas targeted by this assay, and inadequate  number of viral copies (<250 copies / mL). A negative result must be combined with clinical observations, patient history, and epidemiological information.  Fact Sheet for Patients:   StrictlyIdeas.no  Fact Sheet for Healthcare Providers: BankingDealers.co.za  This test is not yet approved or  cleared by the [place] FDA and has been authorized for detection and/or diagnosis of SARS-CoV-2 by FDA under an Emergency Use Authorization (EUA).  This EUA will remain in effect (meaning this test can be used) for the duration of the COVID-19 declaration under Section 564(b)(1) of the Act, 21 U.S.C. section 360bbb-3(b)(1), unless the authorization is terminated or revoked sooner.  Performed at [facility], [address] [place], [place] [address]      Total time spend on discharging this patient, including the last patient exam, discussing the hospital stay, instructions for ongoing care as it relates to all pertinent caregivers, as well as preparing the medical discharge records, prescriptions, and/or referrals as applicable, is 50 minutes.    Enzo Bi, MD  [facility] 01/12/2020, 1:45 PM  If 7PM-7AM, please contact night-coverage

## 2020-01-12 NOTE — Progress Notes (Signed)
*  PRELIMINARY RESULTS* Echocardiogram 2D Echocardiogram has been performed.  Patrick Bailey 01/12/2020, 2:34 PM

## 2020-01-12 NOTE — Progress Notes (Signed)
Chart reviewed, Pt visited. NO St needs identified as Pt's speech is fluent and appropriate. No dysphagia noted. Will complete ST order as a screen. Please reconsult if we can be of further assistance. Pt asked for shampoo to wash his hair before ST left. Given shampoo.

## 2020-01-12 NOTE — Evaluation (Signed)
Physical Therapy Evaluation Patient Details Name: Patrick Bailey. MRN: [id number] DOB: Apr 28, 1961 Today's Date: 01/12/2020   History of Present Illness  presented to ER from eye doctor secondary to acute visual changes in R eye; admitted for CVA work up, significant for R central retinal artery occlusion  Clinical Impression  Patient standing indep in room upon arrival, easily and fluidily negotiates room environment (including obstacle negotiation, turns/changes of direction) without difficulty.  Reports mild blurriness in R upper/lateral visual quadrant, intermittent 'black spots' with L eye closed; does feel it slightly improved since previous date.  Denies strength, sensory or coordination deficits in all extremities; no focal weakness appreciated with mobility assessment.  Able to complete bed mobility indep; sit/stand, basic transfers and gait (200') without assist device, indep; up/down 4 steps without rails, indep.  Gait pattern demonstrates reciprocal stepping pattern with good step height/length, good cadence and overall gait speed; completes dynamic gait components without difficulty, LOB or safety concern.  Appears to be at baseline level of functional ability (indep without assist device); no acute PT needs identified at this time.  Will complete PT order; please re-consult should needs change.     Follow Up Recommendations No PT follow up    Equipment Recommendations       Recommendations for Other Services       Precautions / Restrictions Precautions Precautions: Fall Restrictions Weight Bearing Restrictions: No      Mobility  Bed Mobility Overal bed mobility: Independent                Transfers Overall transfer level: Independent Equipment used: None             General transfer comment: good LE strength/power  Ambulation/Gait Ambulation/Gait assistance: Independent Gait Distance (Feet): 200 Feet Assistive device: None       General Gait  Details: reciprocal stepping pattern with good step height/length, good cadence and overall gait speed; completes dynamic gait component without difficulty, LOB or safety concern. Reports gait performance is baseline for him; denies acute deficit in gait/balance abilities  Stairs Stairs: Yes Stairs assistance: Modified independent (Device/Increase time) Stair Management: No rails Number of Stairs: 4 General stair comments: reciprocal stepping pattern, good LE strength/stability and overall control in periods of modified SLS  Wheelchair Mobility    Modified Rankin (Stroke Patients Only)       Balance Overall balance assessment: Modified Independent                                           Pertinent Vitals/Pain Pain Assessment: No/denies pain    Home Living Family/patient expects to be discharged to:: Private residence Living Arrangements: Alone Available Help at Discharge: Family;Available PRN/intermittently Type of Home: Mobile home Home Access: Stairs to enter Entrance Stairs-Rails: Right Entrance Stairs-Number of Steps: 2 Home Layout: One level        Prior Function Level of Independence: Independent         Comments: Indep with ADLs, household and community mobilization without assist device; working full-time as Press [employer]   Dominant Hand: Right    Extremity/Trunk Assessment   Upper Extremity Assessment Upper Extremity Assessment: Overall WFL for tasks assessed (grossly symmetrical and WFL; no focal weakness noted)    Lower Extremity Assessment Lower Extremity Assessment: Overall WFL for tasks assessed (grossly symmetrical and WFL; no focal  weakness noted)       Communication   Communication: No difficulties  Cognition Arousal/Alertness: Awake/alert Behavior During Therapy: WFL for tasks assessed/performed Overall Cognitive Status: Within Functional Limits for tasks assessed                                         General Comments      Exercises Other Exercises Other Exercises: Briefly reviewed impact of vision on mobility (pathway awareness and visual scanning, depth perception).  Patient voiced understanding of all information.   Assessment/Plan    PT Assessment Patent does not need any further PT services  PT Problem List         PT Treatment Interventions      PT Goals (Current goals can be found in the Care Plan section)  Acute Rehab PT Goals PT Goal Formulation: All assessment and education complete, DC therapy Time For Goal Achievement: 01/12/20 Potential to Achieve Goals: Good    Frequency     Barriers to discharge        Co-evaluation               AM-PAC PT "6 Clicks" Mobility  Outcome Measure Help needed turning from your back to your side while in a flat bed without using bedrails?: None Help needed moving from lying on your back to sitting on the side of a flat bed without using bedrails?: None Help needed moving to and from a bed to a chair (including a wheelchair)?: None Help needed standing up from a chair using your arms (e.g., wheelchair or bedside chair)?: None Help needed to walk in hospital room?: None Help needed climbing 3-5 steps with a railing? : None 6 Click Score: 24    End of Session   Activity Tolerance: Patient tolerated treatment well Patient left: in bed;with call bell/phone within reach   PT Visit Diagnosis: Other symptoms and signs involving the nervous system (R56.153)    Time: 7943-2761 PT Time Calculation (min) (ACUTE ONLY): 10 min   Charges:   PT Evaluation $PT Eval Low Complexity: 1 Low         Tawna Alwin H. Owens Shark, PT, DPT, NCS 01/12/20, 9:20 AM [phone]
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
## 2020-01-12 NOTE — Evaluation (Signed)
Occupational Therapy Evaluation Patient Details Name: Patrick Bailey. MRN: [id number] DOB: March 15, 1961 Today's Date: 01/12/2020    History of Present Illness 59yo male presented to ER from eye doctor secondary to acute visual changes in R eye; admitted for CVA work up, significant for R central retinal artery occlusion.   Clinical Impression   Pt seen for OT evaluation this date. Pt was independent with mobility, ADL, and IADL, including working as a Teacher, music prior to admission with no falls reported in past 12 months. Pt currently presents with RUQ visual field deficits in R eye only which impacts his ability to perform higher level IADL tasks such as driving and working. Pt able to perform basic ADL and functional mobility independently. Pt instructed in visual compensatory strategies to maximize safety and independence with negotiating various environments such as grocery store; educated in role of vision OT specialists to support pt's return to work and driving. Pt very interested as he is eager to get back to work and hopeful to get his vision back. Pt will benefit from additional skilled OT services, particularly with a specialized low vision OT to maximize pt's return to PLOF. Local low vision OT resources provided to case manager to facilitate referral.     Follow Up Recommendations  Home health OT (HH low vision OT)    Equipment Recommendations  None recommended by OT    Recommendations for Other Services       Precautions / Restrictions Precautions Precautions: Fall Restrictions Weight Bearing Restrictions: No      Mobility Bed Mobility Overal bed mobility: Independent                Transfers Overall transfer level: Independent Equipment used: None             General transfer comment: good LE strength/power    Balance Overall balance assessment: Modified Independent                                         ADL either  performed or assessed with clinical judgement   ADL Overall ADL's : Modified independent                                       General ADL Comments: No significant difficulty with any ADL tasks.     Vision Patient Visual Report: Blurring of vision Vision Assessment?: Yes Alignment/Gaze Preference: Within Defined Limits Visual Fields: Impaired-to be further tested in functional context;Other (comment) (RUQ visual deficit in R eye only)     Perception     Praxis      Pertinent Vitals/Pain Pain Assessment: No/denies pain     Hand Dominance Right   Extremity/Trunk Assessment Upper Extremity Assessment Upper Extremity Assessment: Overall WFL for tasks assessed (grossly symmetrical and WFL; no focal weakness noted)   Lower Extremity Assessment Lower Extremity Assessment: Overall WFL for tasks assessed (grossly symmetrical and WFL; no focal weakness noted)   Cervical / Trunk Assessment Cervical / Trunk Assessment: Normal   Communication Communication Communication: No difficulties   Cognition Arousal/Alertness: Awake/alert Behavior During Therapy: WFL for tasks assessed/performed Overall Cognitive Status: Within Functional Limits for tasks assessed  General Comments       Exercises Other Exercises Other Exercises: Briefly reviewed impact of vision on mobility (pathway awareness and visual scanning, depth perception).  Patient voiced understanding of all information. Other Exercises: Pt instructed in visual compensatory strategies to maximize safety and independence with negotiating various environments such as grocery store; educated in role of vision OT specialists to support pt's return to work and driving   Shoulder Victor expects to be discharged to:: Private residence Living Arrangements: Alone Available Help at Discharge: Family;Available  PRN/intermittently Type of Home: Mobile home Home Access: Stairs to enter Entrance Stairs-Number of Steps: 2 Entrance Stairs-Rails: Right Home Layout: One level                          Prior Functioning/Environment Level of Independence: Independent        Comments: Indep with ADLs, household and community mobilization without assist device; working full-time as Teacher, music        OT Problem List: Impaired vision/perception      OT Treatment/Interventions: Field seismologist;Therapeutic activities;Neuromuscular education;Visual/perceptual remediation/compensation    OT Goals(Current goals can be found in the care plan section) Acute Rehab OT Goals Patient Stated Goal: to get by vision back so I can work OT Goal Formulation: With patient Time For Goal Achievement: 01/26/20 Potential to Achieve Goals: Good ADL Goals Additional ADL Goal #1: Pt will utilize at least 1 learned visual compensatory strategy to improve pt's safety and indepence with IADL tasks requiring no verbal cues.  OT Frequency: Min 1X/week   Barriers to D/C:            Co-evaluation              AM-PAC OT "6 Clicks" Daily Activity     Outcome Measure Help from another person eating meals?: None Help from another person taking care of personal grooming?: None Help from another person toileting, which includes using toliet, bedpan, or urinal?: None   Help from another person to put on and taking off regular upper body clothing?: None Help from another person to put on and taking off regular lower body clothing?: None 6 Click Score: 20   End of Session    Activity Tolerance: Patient tolerated treatment well Patient left: in bed;with call bell/phone within reach;with bed alarm set  OT Visit Diagnosis: Other abnormalities of gait and mobility (R26.89);Other symptoms and signs involving cognitive function                Time: 8563-1497 OT Time Calculation (min): 11  min Charges:  OT General Charges $OT Visit: 1 Visit OT Evaluation $OT Eval Low Complexity: 1 Low  Jeni Salles, MPH, MS, OTR/L ascom [phone] 01/12/20, 9:48 AM

## 2020-01-12 NOTE — Progress Notes (Signed)
Patient ready to leave. Discharge instructions given and verbalizes understanding. Patient with no complaints at the current time.

## 2020-01-14 ENCOUNTER — Encounter: Payer: Self-pay | Admitting: Family Medicine

## 2020-01-14 DIAGNOSIS — I5189 Other ill-defined heart diseases: Secondary | ICD-10-CM | POA: Insufficient documentation

## 2020-01-14 DIAGNOSIS — I6529 Occlusion and stenosis of unspecified carotid artery: Secondary | ICD-10-CM | POA: Insufficient documentation

## 2020-01-15 ENCOUNTER — Telehealth: Payer: Self-pay

## 2020-01-15 NOTE — Telephone Encounter (Signed)
2nd/final attempt- Left HIPAA complaint message to return my call- need to complete TCM, follow up scheduled for 01/16/2020 @ 12 pm.

## 2020-01-15 NOTE — Telephone Encounter (Signed)
1st attempt- Left HIPAA complaint message to return my call- need to complete TCM, follow up scheduled for 01/16/2020 @ 12 pm.

## 2020-01-16 ENCOUNTER — Encounter: Payer: Self-pay | Admitting: Family Medicine

## 2020-01-16 ENCOUNTER — Ambulatory Visit: Payer: 59 | Admitting: Family Medicine

## 2020-01-16 ENCOUNTER — Other Ambulatory Visit: Payer: Self-pay

## 2020-01-16 VITALS — BP 136/84 | HR 80 | Temp 97.6°F | Ht 71.0 in | Wt 213.2 lb

## 2020-01-16 DIAGNOSIS — H3411 Central retinal artery occlusion, right eye: Secondary | ICD-10-CM | POA: Diagnosis not present

## 2020-01-16 DIAGNOSIS — E781 Pure hyperglyceridemia: Secondary | ICD-10-CM

## 2020-01-16 DIAGNOSIS — G629 Polyneuropathy, unspecified: Secondary | ICD-10-CM

## 2020-01-16 DIAGNOSIS — Z7289 Other problems related to lifestyle: Secondary | ICD-10-CM

## 2020-01-16 DIAGNOSIS — F1090 Alcohol use, unspecified, uncomplicated: Secondary | ICD-10-CM

## 2020-01-16 DIAGNOSIS — Z789 Other specified health status: Secondary | ICD-10-CM

## 2020-01-16 DIAGNOSIS — I6523 Occlusion and stenosis of bilateral carotid arteries: Secondary | ICD-10-CM

## 2020-01-16 DIAGNOSIS — I5189 Other ill-defined heart diseases: Secondary | ICD-10-CM

## 2020-01-16 DIAGNOSIS — I10 Essential (primary) hypertension: Secondary | ICD-10-CM | POA: Diagnosis not present

## 2020-01-16 DIAGNOSIS — Z8673 Personal history of transient ischemic attack (TIA), and cerebral infarction without residual deficits: Secondary | ICD-10-CM

## 2020-01-16 MED ORDER — AMITRIPTYLINE HCL 50 MG PO TABS: 50.0000 mg | ORAL_TABLET | Freq: Every day | ORAL | 1 refills | Status: DC

## 2020-01-16 NOTE — Progress Notes (Signed)
This visit was conducted in person.  BP 136/84 (BP Location: Right Arm, Patient Position: Sitting, Cuff Size: Large)   Pulse 80   Temp 97.6 F (36.4 C) (Temporal)   Ht 5\' 11"  (1.803 m)   Wt 213 lb 3 oz (96.7 kg)   SpO2 97%   BMI 29.73 kg/m   BP Readings from Last 3 Encounters:  01/16/20 136/84  01/12/20 (!) 149/102  01/05/20 134/82    CC: hosp f/u visit  Subjective:    Patient ID: Patrick Ochoa., male    DOB: Sep 02, 1960, 60 y.o.   MRN: [id number]  HPI: Patrick Tufo. is a 59 y.o. male presenting on 01/16/2020 for Hospitalization Follow-up   Recent hospitalization for sudden onset right upper field vision loss evaluated by ophthalmologist concern for central retinal artery occlusion, sent to ER for evaluation. Head CT and MRI were largely reassuring. Thought embolic stroke from carotid or cardiac source.   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   Plavix was started. Recommend dual antiplatelet for 3 wks then switch to plavix monotherapy.   Has taken off 2 weeks from work.  Since home denies unilateral paresthesias, weakness, headache, slurred speech, dizziness or chest pain.   Ongoing vision loss - blurry vision and dark spot right vision midline up. Will call to schedule f/u with ophthalmology.   He was placed on CIWA protocol during hospitalization due to concern over alcohol use. Discussed alcohol intake - he states he's cutting down - down to 2 beers/day.    TCM hosp f/u phone call: not completed - unable to reach patient.  Date of admission: 01/11/2020 Date of discharge: 01/12/2020 Seen within 2 business days of discharge.  D/C summary not available.      Relevant past medical, surgical, family and social history reviewed and updated as indicated. Interim medical history since our last visit reviewed. Allergies and medications reviewed and updated. Outpatient Medications Prior to  Visit  Medication Sig Dispense Refill  . amLODipine (NORVASC) 10 MG tablet Take 1 tablet by mouth once daily (Patient taking differently: Take 10 mg by mouth daily. TAKE 1 TABLET BY MOUTH ONCE DAILY) 90 tablet 3  . aspirin EC 81 MG tablet Take 1 tablet (81 mg total) by mouth daily for 21 days. 21 tablet 0  . atorvastatin (LIPITOR) 40 MG tablet Take 1 tablet by mouth once daily (Patient taking differently: Take 40 mg by mouth daily. ) 90 tablet 3  . clopidogrel (PLAVIX) 75 MG tablet Take 1 tablet (75 mg total) by mouth daily. New blood thinner. 30 tablet 2  . DENTAGEL 1.1 % GEL dental gel Place 1 application onto teeth at bedtime.     . docusate sodium (COLACE) 100 MG capsule Take 1 capsule (100 mg total) by mouth 2 (two) times daily. To prevent constipation while taking pain medication. 30 capsule 0  . EUTHYROX 150 MCG tablet Take 1 tablet by mouth once daily (Patient taking differently: Take 150 mcg by mouth daily at 6 (six) AM. ) 30 tablet 11  . fenofibrate 160 MG tablet Take 1 tablet (160 mg total) by mouth daily. 90 tablet 3  . metoprolol tartrate (LOPRESSOR) 100 MG tablet Take 1 tablet by mouth twice daily (Patient taking differently: Take 100 mg by mouth 2 (two) times daily. ) 180 tablet 3  . MITIGARE 0.6 MG CAPS Take 0.6 mg by mouth daily as  needed (gout flare). TAKE 1 CAPSULE BY MOUTH IN THE MORNING AS NEEDED    . Omega-3 Fatty Acids (FISH OIL) 1000 MG CAPS Take 2 capsules (2,000 mg total) by mouth 2 (two) times a day.  0  . omeprazole (PRILOSEC) 20 MG capsule Take 20 mg by mouth daily.    . valACYclovir (VALTREX) 1000 MG tablet Take 2 tablets (2,000 mg total) by mouth 2 (two) times daily. TAKE 2 TABLETS BY MOUTH TWICE DAILY ONE  TIME  PER  EPISODE    . vitamin B-12 (CYANOCOBALAMIN) 1000 MCG tablet Take 1 tablet (1,000 mcg total) by mouth daily.    Marland Kitchen amitriptyline (ELAVIL) 50 MG tablet TAKE 1 TABLET BY MOUTH AT BEDTIME (Patient taking differently: Take 50 mg by mouth at bedtime. ) 90 tablet 1    No facility-administered medications prior to visit.     Per HPI unless specifically indicated in ROS section below Review of Systems Objective:  BP 136/84 (BP Location: Right Arm, Patient Position: Sitting, Cuff Size: Large)   Pulse 80   Temp 97.6 F (36.4 C) (Temporal)   Ht 5\' 11"  (1.803 m)   Wt 213 lb 3 oz (96.7 kg)   SpO2 97%   BMI 29.73 kg/m   Wt Readings from Last 3 Encounters:  01/16/20 213 lb 3 oz (96.7 kg)  01/11/20 210 lb 5.1 oz (95.4 kg)  01/05/20 211 lb 8 oz (95.9 kg)      Physical Exam Vitals and nursing note reviewed.  Constitutional:      Appearance: Normal appearance. He is not ill-appearing.  HENT:     Head: Normocephalic and atraumatic.     Right Ear: Tympanic membrane and external ear normal. There is impacted cerumen.     Left Ear: Tympanic membrane, ear canal and external ear normal. There is no impacted cerumen.     Ears:     Comments: Persistent cerumen impaction on right - declines further treatment today.  Eyes:     Extraocular Movements: Extraocular movements intact.     Pupils: Pupils are equal, round, and reactive to light.  Cardiovascular:     Rate and Rhythm: Normal rate and regular rhythm.     Pulses: Normal pulses.     Heart sounds: Normal heart sounds. No murmur heard.   Pulmonary:     Effort: Pulmonary effort is normal. No respiratory distress.     Breath sounds: Normal breath sounds. No wheezing, rhonchi or rales.  Musculoskeletal:     Right lower leg: No edema.     Left lower leg: No edema.  Skin:    General: Skin is warm and dry.     Findings: No rash.  Neurological:     Mental Status: He is alert.  Psychiatric:        Mood and Affect: Mood normal.        Behavior: Behavior normal.       Lab Results  Component Value Date   CHOL 156 01/12/2020   HDL 34 (L) 01/12/2020   LDLCALC UNABLE TO CALCULATE IF TRIGLYCERIDE OVER 400 mg/dL 01/12/2020   LDLDIRECT 72.1 01/12/2020   TRIG 480 (H) 01/12/2020   CHOLHDL 4.6 01/12/2020    Lab Results  Component Value Date   TSH 1.28 12/01/2019    ECHO COMPLETE 01/12/2020: 1. Left ventricular ejection fraction, by estimation, is 70 to 75%. Left ventricular ejection fraction by PLAX is 74 %. The left ventricle has hyperdynamic function. The left ventricle has no regional wall motion  abnormalities. Left ventricular diastolic parameters are consistent with Grade III diastolic dysfunction (restrictive).  2. Right ventricular systolic function is normal. The right ventricular size is not well visualized. There is normal pulmonary artery systolic pressure.  3. The mitral valve is normal in structure. No evidence of mitral valve regurgitation.  4. The aortic valve is grossly normal. Aortic valve regurgitation is not visualized.   CAROTID US 01/12/2020: 1. Bilateral carotid bifurcation plaque resulting in less than 50% diameter ICA stenosis. 2. Antegrade bilateral vertebral arterial flow. Assessment & Plan:  This visit occurred during the SARS-CoV-2 public health emergency.  Safety protocols were in place, including screening questions prior to the visit, additional usage of staff PPE, and extensive cleaning of exam room while observing appropriate contact time as indicated for disinfecting solutions.   Problem List Items Addressed This Visit    Neuropathy    Pending neuro f/u next month. Amitriptyline may have helped. Continue to encourage alcohol cessation.       Hypertriglyceridemia    Continue lipitor, fibrate, fish oil 2000mg  bid Latest LDL 72.      History of cerebellar stroke    Has previously seen Dr [name].       Essential hypertension    Chronic, stable on current regimen - continue.       Diastolic dysfunction    Reviewed with patient importance of blood pressure control to help manage this.       Central retinal artery occlusion of right eye - Primary    Diagnosed by ophthalmology, s/p short hospitalization for stroke workup - largely unrevealing. He states  he was told likely embolic source from carotid arteries. Discussed recommendation to see cardiology for event monitor r/o paroxysmal afib as source of emboli, he declines. Will continue aspirin/plavix, consider neuro vs cardiology f/u in future.       Carotid stenosis    Noted on recent carotid US. ?source of emboli for recent CRAO.       Alcohol use ([place])    Reviewed with patient as well as concern during hospitalization. He states he is titrating down on alcohol from 5/day to 2/day.           Meds ordered this encounter  Medications  . amitriptyline (ELAVIL) 50 MG tablet    Sig: Take 1 tablet (50 mg total) by mouth at bedtime.    Dispense:  90 tablet    Refill:  1   No orders of the defined types were placed in this encounter.  Patient Instructions  Continue aspirin and plavix in the morning. Amitriptyline refilled.  Continue other medicines.  We should repeat carotid ultrasound in 1 year to monitor plaque buildup.  Continue healthy diet changes to date - lots of fruits, vegetables, and fiber (whole grains).  Return in 3 months for physical, sooner if needed.     Follow up plan: Return in about 3 months (around 04/17/2020), or if symptoms worsen or fail to improve, for annual exam, prior fasting for blood work.  Ria Bush, MD

## 2020-01-16 NOTE — Patient Instructions (Addendum)
Continue aspirin and plavix in the morning. Amitriptyline refilled.  Continue other medicines.  We should repeat carotid ultrasound in 1 year to monitor plaque buildup.  Continue healthy diet changes to date - lots of fruits, vegetables, and fiber (whole grains).  Return in 3 months for physical, sooner if needed.

## 2020-01-17 ENCOUNTER — Encounter: Payer: Self-pay | Admitting: Family Medicine

## 2020-01-17 ENCOUNTER — Telehealth: Payer: Self-pay | Admitting: Family Medicine

## 2020-01-17 DIAGNOSIS — H3411 Central retinal artery occlusion, right eye: Secondary | ICD-10-CM | POA: Insufficient documentation

## 2020-01-17 NOTE — Assessment & Plan Note (Addendum)
Diagnosed by ophthalmology, s/p short hospitalization for stroke workup - largely unrevealing. He states he was told likely embolic source from carotid arteries. Discussed recommendation to see cardiology for event monitor r/o paroxysmal afib as source of emboli, he declines. Will continue aspirin/plavix, consider neuro vs cardiology f/u in future.

## 2020-01-17 NOTE — Assessment & Plan Note (Signed)
Reviewed with patient importance of blood pressure control to help manage this.

## 2020-01-17 NOTE — Assessment & Plan Note (Addendum)
Continue lipitor, fibrate, fish oil 2000mg  bid Latest LDL 72.

## 2020-01-17 NOTE — Assessment & Plan Note (Signed)
Pending neuro f/u next month. Amitriptyline may have helped. Continue to encourage alcohol cessation.

## 2020-01-17 NOTE — Assessment & Plan Note (Signed)
Has previously seen Dr [name].

## 2020-01-17 NOTE — Assessment & Plan Note (Signed)
Noted on recent carotid US. ?source of emboli for recent CRAO.

## 2020-01-17 NOTE — Telephone Encounter (Signed)
Lvm asking pt to call back.  Need to relay Dr. G's message.  

## 2020-01-17 NOTE — Assessment & Plan Note (Signed)
Reviewed with patient as well as concern during hospitalization. He states he is titrating down on alcohol from 5/day to 2/day.

## 2020-01-17 NOTE — Telephone Encounter (Signed)
Please call - reviewing hospital records, recommendation was plavix + aspirin 81mg  for 3 weeks then transition to plavix alone. Recommend switch to plavix alone on July 2nd.

## 2020-01-17 NOTE — Assessment & Plan Note (Signed)
Chronic, stable on current regimen - continue. 

## 2020-01-18 NOTE — Telephone Encounter (Signed)
Spoke with pt relaying Dr. G's message.  Verbalizes understanding and expresses his thanks.  

## 2020-01-26 ENCOUNTER — Encounter: Payer: Self-pay | Admitting: Family Medicine

## 2020-01-26 DIAGNOSIS — N400 Enlarged prostate without lower urinary tract symptoms: Secondary | ICD-10-CM | POA: Insufficient documentation

## 2020-02-21 ENCOUNTER — Ambulatory Visit: Payer: 59 | Admitting: Neurology

## 2020-02-21 ENCOUNTER — Encounter: Payer: Self-pay | Admitting: Neurology

## 2020-02-21 VITALS — BP 130/86 | HR 73 | Ht 72.0 in | Wt 214.0 lb

## 2020-02-21 DIAGNOSIS — R200 Anesthesia of skin: Secondary | ICD-10-CM | POA: Diagnosis not present
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
## 2020-02-28 ENCOUNTER — Telehealth: Payer: Self-pay | Admitting: Neurology

## 2020-02-28 NOTE — Telephone Encounter (Signed)
[employer] pending uploaded notes  

## 2020-03-04 NOTE — Telephone Encounter (Signed)
I called for a peer to peer review, MRI of the lumbar spine has been approved, approval number [id number]

## 2020-03-04 NOTE — Telephone Encounter (Signed)
[employer] did not approve the MRI.  "The reason for our determination is: based on [facility] spine imaging guidelines sections lower extremity pain with Neurological Features (Radiculopathy, Radiculitis, or Plexopathy and Neuropathy) with or with out Low back (lumbar spine) pain and general guidelines, we cannot approve this request . Your records show that you have changes in the feeling inf your leg(s) and/or feet. The reason this request cannot be approved is because. There is no record that you had contact (in person, by phone, by mail, or by messaging) with your doctor after a recent (within three months) six week trial of treatment that failed to improve your symptoms. You have not completed six weeks of treatment directed by your doctor."  There is an option to do a peer to peer. The phone number is [phone] and the case number is [id number]. It does need to be scheduled by 03/22/20.

## 2020-03-05 NOTE — Telephone Encounter (Signed)
LVM for pt to call back about scheduling [facility] Auth: [id number] (exp. 03/04/20 to 04/18/20)

## 2020-03-06 ENCOUNTER — Ambulatory Visit (HOSPITAL_COMMUNITY)
Admission: EM | Admit: 2020-03-06 | Discharge: 2020-03-06 | Disposition: A | Discharge status: Home / Self Care | Payer: 59 | Attending: Family Medicine | Admitting: Family Medicine

## 2020-03-06 ENCOUNTER — Other Ambulatory Visit: Payer: Self-pay

## 2020-03-06 ENCOUNTER — Encounter (HOSPITAL_COMMUNITY): Payer: Self-pay

## 2020-03-06 DIAGNOSIS — E039 Hypothyroidism, unspecified: Secondary | ICD-10-CM | POA: Diagnosis not present

## 2020-03-06 DIAGNOSIS — Z8673 Personal history of transient ischemic attack (TIA), and cerebral infarction without residual deficits: Secondary | ICD-10-CM | POA: Insufficient documentation

## 2020-03-06 DIAGNOSIS — Z87891 Personal history of nicotine dependence: Secondary | ICD-10-CM | POA: Insufficient documentation

## 2020-03-06 DIAGNOSIS — Z7902 Long term (current) use of antithrombotics/antiplatelets: Secondary | ICD-10-CM | POA: Diagnosis not present

## 2020-03-06 DIAGNOSIS — I10 Essential (primary) hypertension: Secondary | ICD-10-CM | POA: Diagnosis not present

## 2020-03-06 DIAGNOSIS — Z79899 Other long term (current) drug therapy: Secondary | ICD-10-CM | POA: Diagnosis not present

## 2020-03-06 DIAGNOSIS — G4700 Insomnia, unspecified: Secondary | ICD-10-CM | POA: Diagnosis not present

## 2020-03-06 DIAGNOSIS — I2510 Atherosclerotic heart disease of native coronary artery without angina pectoris: Secondary | ICD-10-CM | POA: Diagnosis not present

## 2020-03-06 DIAGNOSIS — N400 Enlarged prostate without lower urinary tract symptoms: Secondary | ICD-10-CM | POA: Diagnosis not present

## 2020-03-06 DIAGNOSIS — Z96653 Presence of artificial knee joint, bilateral: Secondary | ICD-10-CM | POA: Insufficient documentation

## 2020-03-06 DIAGNOSIS — J069 Acute upper respiratory infection, unspecified: Secondary | ICD-10-CM | POA: Diagnosis not present

## 2020-03-06 DIAGNOSIS — Z8249 Family history of ischemic heart disease and other diseases of the circulatory system: Secondary | ICD-10-CM | POA: Insufficient documentation

## 2020-03-06 DIAGNOSIS — G629 Polyneuropathy, unspecified: Secondary | ICD-10-CM | POA: Insufficient documentation

## 2020-03-06 DIAGNOSIS — E781 Pure hyperglyceridemia: Secondary | ICD-10-CM | POA: Diagnosis not present

## 2020-03-06 DIAGNOSIS — Z85810 Personal history of malignant neoplasm of tongue: Secondary | ICD-10-CM | POA: Diagnosis not present

## 2020-03-06 DIAGNOSIS — Z20822 Contact with and (suspected) exposure to covid-19: Secondary | ICD-10-CM | POA: Insufficient documentation

## 2020-03-06 DIAGNOSIS — Z7901 Long term (current) use of anticoagulants: Secondary | ICD-10-CM | POA: Insufficient documentation

## 2020-03-06 DIAGNOSIS — K219 Gastro-esophageal reflux disease without esophagitis: Secondary | ICD-10-CM | POA: Diagnosis not present

## 2020-03-06 LAB — SARS CORONAVIRUS 2 (TAT 6-24 HRS): SARS Coronavirus 2: NEGATIVE

## 2020-03-06 NOTE — Discharge Instructions (Signed)
Push fluids to ensure adequate hydration and keep secretions thin.  Tylenol and/or ibuprofen as needed for pain or fevers. You may try some vitamins to help your immune system potentially:  Vitamin C 500mg  twice a day. Zinc 50mg  daily. Vitamin D 5000IU daily.   Self isolate until covid results are back and negative.  Will notify you by phone of any positive findings. Your negative results will be sent through your MyChart.
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
## 2020-03-06 NOTE — ED Triage Notes (Signed)
Pt presents to UC for sore throat, cough, nasal congestion. Pt requesting covid testing. Pt reports subjective fevers at home. Pt has been treating with mucinex with out relief.

## 2020-03-07 NOTE — Telephone Encounter (Signed)
x2 lvm for pt to call back.  

## 2020-03-07 NOTE — Telephone Encounter (Signed)
per pt would like a Friday i informed him i would send the order to GI. they will reach out to the pt to schedule

## 2020-03-17 ENCOUNTER — Ambulatory Visit
Admission: RE | Admit: 2020-03-17 | Discharge: 2020-03-17 | Disposition: A | Discharge status: Home / Self Care | Payer: 59 | Source: Ambulatory Visit | Attending: Neurology | Admitting: Neurology

## 2020-03-17 DIAGNOSIS — R200 Anesthesia of skin: Secondary | ICD-10-CM

## 2020-03-18 ENCOUNTER — Encounter: Payer: Self-pay | Admitting: Family Medicine

## 2020-03-18 ENCOUNTER — Ambulatory Visit (INDEPENDENT_AMBULATORY_CARE_PROVIDER_SITE_OTHER): Payer: 59 | Admitting: Family Medicine

## 2020-03-18 ENCOUNTER — Telehealth: Payer: Self-pay | Admitting: Neurology

## 2020-03-18 DIAGNOSIS — R509 Fever, unspecified: Secondary | ICD-10-CM

## 2020-03-18 DIAGNOSIS — R059 Cough, unspecified: Secondary | ICD-10-CM

## 2020-03-18 DIAGNOSIS — R05 Cough: Secondary | ICD-10-CM

## 2020-03-18 MED ORDER — AZITHROMYCIN 250 MG PO TABS
ORAL_TABLET | ORAL | 0 refills | Status: AC
Start: 2020-03-18 — End: 2020-03-23

## 2020-03-18 MED ORDER — GABAPENTIN 300 MG PO CAPS
ORAL_CAPSULE | ORAL | 3 refills | Status: DC
Start: 2020-03-18 — End: 2020-07-08

## 2020-03-18 NOTE — Progress Notes (Signed)
Ibeth Fahmy T. Raynell Scott, MD Primary Care and Sports Medicine [facility] [place], [address] Phone: [phone]  FAX: [place]. - 59 y.o. male  MRN [id number]  Date of Birth: [birth year]  Visit Date: 03/18/2020  PCP: Ria Bush, MD  Referred by: Ria Bush, MD Chief Complaint  Patient presents with  . Nasal Congestion    Negative for Covid last Wednesday  . Cough    with green phelgm  . Fever   Virtual Visit via Video Note:  I connected with  Patrick Bailey. on 03/18/2020  3:40 PM EDT by a video enabled telemedicine application and verified that I am speaking with the correct person using two identifiers.   Location patient: home computer, tablet, or smartphone Location provider: work or home office Consent: Verbal consent directly obtained from [employer] Persons participating in the virtual visit: patient, provider  I discussed the limitations of evaluation and management by telemedicine and the availability of in person appointments. The patient expressed understanding and agreed to proceed.  History of Present Illness:  Went to UC on 03/06/2020 and was tested for Covid and was negative.  He has a productive cough of green sputum.  He has also developed a fever now.   He reports that he had his Covid test approximately 5 days after his onset of symptoms.  He reports to me now that he has had symptoms for approximately 14 days, and symptoms have persisted and now appear to be progressed.  His cough and productive cough have worsened.  He generally does not feel well.  He has tried multiple over-the-counter remedies including Mucinex and other cold and flu medicine without any significant relief of symptoms.  Sick for 10 days.    Covid neg 5 days into his illness  Review of Systems as above: See pertinent positives and pertinent negatives per HPI No acute distress  verbally   Observations/Objective/Exam:  An attempt was made to discern vital signs over the phone and per patient if applicable and possible.   General:    Alert, Oriented, appears well and in no acute distress  Pulmonary:     On inspection no signs of respiratory distress.  Psych / Neurological:     Pleasant and cooperative.  Assessment and Plan:    ICD-10-CM   1. Cough  R05   2. Fever, unspecified fever cause  R50.9    Negative Covid PCR 5 days after symptoms.  This would make Covid much less likely.  Worsening symptoms with fever and productive cough, consideration for possible pneumonia would be high on the differential, so I am can place the patient on a Z-Pak.  He is to call in several days if his symptoms persist and worsen.  I discussed the assessment and treatment plan with the patient. The patient was provided an opportunity to ask questions and all were answered. The patient agreed with the plan and demonstrated an understanding of the instructions.   The patient was advised to call back or seek an in-person evaluation if the symptoms worsen or if the condition fails to improve as anticipated.  Follow-up: prn unless noted otherwise below No follow-ups on file.  Meds ordered this encounter  Medications  . azithromycin (ZITHROMAX) 250 MG tablet    Sig: Take 2 tablets (500 mg total) by mouth daily for 1 day, THEN 1 tablet (250 mg total) daily for 4 days.  Dispense:  6 tablet    Refill:  0   No orders of the defined types were placed in this encounter.   Signed,  Maud Deed. Shauntelle Jamerson, MD

## 2020-03-18 NOTE — Telephone Encounter (Signed)
  I called the patient.  MRI of the lumbar spine does not show a source of left leg numbness and pain.  He is also already had MRI of the brain which was relatively unremarkable.  The patient reports lancinating pains in the thigh and leg that may occur 2-3 times a week.  He does not want to go on medication for this.  I will start low-dose gabapentin.  MRI lumbar 03/17/20:  IMPRESSION: This MRI of the lumbar spine without contrast shows the following: 1.    At L3-L4, there is mild spinal stenosis due to disc protrusion and facet hypertrophy.  There is mild foraminal and lateral recess stenosis but no nerve root compression. 2.   At L4-L5, there is a right paramedian disc protrusion causing moderate right foraminal narrowing and moderate right lateral recess stenosis.  The right L5 nerve root is slightly displaced posteriorly but not compressed.    MRI brain 01/11/20:  IMPRESSION: 1. No acute intracranial abnormality.  2. Essentially stable noncontrast MRI appearance of the brain since 2019 with mild to moderate for age white matter changes, nonspecific but most commonly due to chronic small vessel disease.

## 2020-03-25 ENCOUNTER — Other Ambulatory Visit: Payer: Self-pay | Admitting: Family Medicine

## 2020-03-26 MED ORDER — VALACYCLOVIR HCL 1 G PO TABS: 2000.0000 mg | ORAL_TABLET | Freq: Two times a day (BID) | ORAL | 3 refills | Status: DC

## 2020-03-26 NOTE — Telephone Encounter (Signed)
refilled 

## 2020-03-26 NOTE — Addendum Note (Signed)
Addended by: Brenton Grills on: 02/01/2025 69:16 PM   Modules accepted: Orders

## 2020-03-26 NOTE — Telephone Encounter (Signed)
Pt stooped in clinic today requesting refill. Lattie Haw can you f/u on this refill. This is not one months worth of medication and pt has refilled 4 times since written in June.

## 2020-03-26 NOTE — Telephone Encounter (Signed)
I may have looked at it wrong.  I will fwd request to Dr. Darnell Level.  Valtrex Last rx:  01/12/20 Last OV:  01/16/20, hosp f/u Next OV:  none

## 2020-04-04 ENCOUNTER — Other Ambulatory Visit: Payer: Self-pay

## 2020-04-04 ENCOUNTER — Encounter (HOSPITAL_COMMUNITY): Payer: Self-pay | Admitting: Emergency Medicine

## 2020-04-04 ENCOUNTER — Ambulatory Visit (HOSPITAL_COMMUNITY)
Admission: EM | Admit: 2020-04-04 | Discharge: 2020-04-04 | Disposition: A | Discharge status: Home / Self Care | Payer: 59 | Attending: Family Medicine | Admitting: Family Medicine

## 2020-04-04 DIAGNOSIS — Z20822 Contact with and (suspected) exposure to covid-19: Secondary | ICD-10-CM | POA: Insufficient documentation

## 2020-04-04 LAB — SARS CORONAVIRUS 2 (TAT 6-24 HRS): SARS Coronavirus 2: NEGATIVE

## 2020-04-04 NOTE — ED Triage Notes (Signed)
Pt states he had covid exposure at work.

## 2020-04-04 NOTE — Discharge Instructions (Signed)

## 2020-04-09 IMAGING — DX PORTABLE RIGHT KNEE - 1-2 VIEW
1 series · 1 of 1 positions shown · non-contrast
Comparison: None.

CLINICAL DATA: Status post right knee replacement.

EXAM:
PORTABLE RIGHT KNEE - 1-2 VIEW

[knee lat]
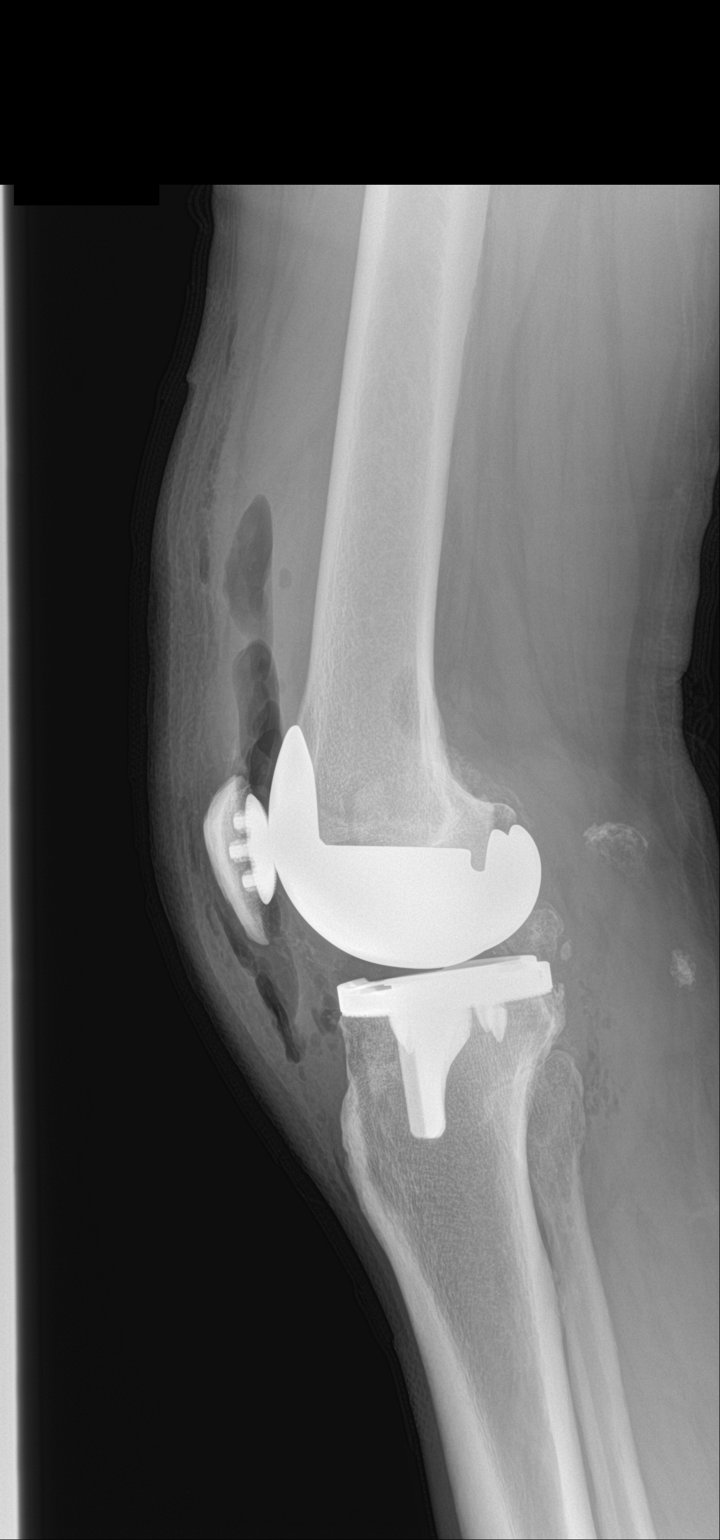

[1 of 1 positions shown; findings below may reference images not displayed]

FINDINGS: Patient is status post right knee replacement. Hardware is in good
position. Postoperative air is seen in the soft tissues.
IMPRESSION: Right knee replacement as above.

## 2020-04-18 ENCOUNTER — Other Ambulatory Visit: Payer: Self-pay | Admitting: Family Medicine

## 2020-04-18 MED ORDER — PANTOPRAZOLE SODIUM 20 MG PO TBEC: 20.0000 mg | DELAYED_RELEASE_TABLET | Freq: Every day | ORAL | 3 refills | Status: DC

## 2020-04-18 MED ORDER — CLOPIDOGREL BISULFATE 75 MG PO TABS: 75.0000 mg | ORAL_TABLET | Freq: Every day | ORAL | 3 refills | Status: DC

## 2020-04-18 NOTE — Telephone Encounter (Signed)
Yes - plavix was started after recent hospitalization 01/2020 with stroke.  Will need lifelong. I have refilled.  We did not receive request - pharmacy must have been sending to hospital doctor who started it (wrong doctor to request refill).  Plavix does interact with his omeprazole - recommend he switch to pantoprazole 20mg  daily (protonix). I have sent in pantoprazole to take in place of omeprazole.

## 2020-04-18 NOTE — Telephone Encounter (Signed)
Per Dr. Jacqlyn Larsen defer.

## 2020-04-18 NOTE — Addendum Note (Signed)
Addended by: Brenton Grills on: 09/11/1066 16:61 PM   Modules accepted: Orders

## 2020-04-18 NOTE — Addendum Note (Signed)
Addended by: Ria Bush on: 04/18/2020 05:14 PM   Modules accepted: Orders

## 2020-04-18 NOTE — Telephone Encounter (Signed)
Pt called stating pharmacy request refill 3 times and has heard from office  He said it was for his blood thinner  He didn't know name of meds.  Pt is out  [facility] [address]

## 2020-04-18 NOTE — Telephone Encounter (Signed)
Lvm asking pt to call back.  I do not see a blood thinner on current med list.  Need name of med pt is referring to.

## 2020-04-18 NOTE — Telephone Encounter (Addendum)
Plz address in Dr. Synthia Innocent absence.  Pt returning call.  States he was given clopidogrel 75 mg to take everyday for the rest of his life since having a stroke.  Med is not on current list but I did find it in med hx.  Pt is completely out of pills.  He is aware Dr. Darnell Level is out of the office but request will be forwarded the covering provider.

## 2020-04-19 NOTE — Telephone Encounter (Signed)
Spoke with pt relaying Dr. G's message.  Pt verbalizes understanding and expresses his thanks.  

## 2020-05-13 ENCOUNTER — Telehealth: Payer: Self-pay | Admitting: Family Medicine

## 2020-05-13 NOTE — Telephone Encounter (Signed)
E-scribed refill.  Plz schedule CPE and lab visits. 

## 2020-05-14 NOTE — Telephone Encounter (Signed)
Pt has been scheduled for cpe in december

## 2020-05-14 NOTE — Telephone Encounter (Signed)
Noted  

## 2020-05-30 ENCOUNTER — Other Ambulatory Visit: Payer: Self-pay | Admitting: Family Medicine

## 2020-05-31 ENCOUNTER — Other Ambulatory Visit: Payer: Self-pay | Admitting: Family Medicine

## 2020-06-19 ENCOUNTER — Telehealth: Payer: Self-pay | Admitting: Family Medicine

## 2020-06-19 MED ORDER — METOPROLOL TARTRATE 100 MG PO TABS: 100.0000 mg | ORAL_TABLET | Freq: Two times a day (BID) | ORAL | 0 refills | Status: DC

## 2020-06-19 NOTE — Telephone Encounter (Signed)
Pt called checking on refill He stated pharmacy has sent request 2x  Metoprolol  [facility] [address]   Pt is out of meds

## 2020-06-19 NOTE — Telephone Encounter (Addendum)
E-scribed refill.   Notified pt via phn call.  Pt expresses his thanks.

## 2020-06-26 ENCOUNTER — Other Ambulatory Visit: Payer: Self-pay | Admitting: Family Medicine

## 2020-06-26 NOTE — Telephone Encounter (Signed)
Valtrex Last filled:  05/23/20, #20 Last OV:  01/16/20, hosp f/u Next OV:  07/19/20, CPE

## 2020-07-08 ENCOUNTER — Other Ambulatory Visit: Payer: Self-pay | Admitting: *Deleted

## 2020-07-08 MED ORDER — GABAPENTIN 300 MG PO CAPS: 300.0000 mg | ORAL_CAPSULE | Freq: Two times a day (BID) | ORAL | 1 refills | Status: DC

## 2020-07-11 ENCOUNTER — Other Ambulatory Visit: Payer: Self-pay | Admitting: Family Medicine

## 2020-07-11 DIAGNOSIS — Z789 Other specified health status: Secondary | ICD-10-CM

## 2020-07-11 DIAGNOSIS — Z7289 Other problems related to lifestyle: Secondary | ICD-10-CM

## 2020-07-11 DIAGNOSIS — E039 Hypothyroidism, unspecified: Secondary | ICD-10-CM

## 2020-07-11 DIAGNOSIS — I10 Essential (primary) hypertension: Secondary | ICD-10-CM

## 2020-07-11 DIAGNOSIS — E781 Pure hyperglyceridemia: Secondary | ICD-10-CM

## 2020-07-11 DIAGNOSIS — N400 Enlarged prostate without lower urinary tract symptoms: Secondary | ICD-10-CM

## 2020-07-12 ENCOUNTER — Other Ambulatory Visit: Payer: Self-pay | Admitting: Family Medicine

## 2020-07-12 ENCOUNTER — Other Ambulatory Visit (INDEPENDENT_AMBULATORY_CARE_PROVIDER_SITE_OTHER): Payer: 59

## 2020-07-12 ENCOUNTER — Other Ambulatory Visit: Payer: Self-pay

## 2020-07-12 DIAGNOSIS — N400 Enlarged prostate without lower urinary tract symptoms: Secondary | ICD-10-CM

## 2020-07-12 DIAGNOSIS — Z7289 Other problems related to lifestyle: Secondary | ICD-10-CM | POA: Diagnosis not present

## 2020-07-12 DIAGNOSIS — E781 Pure hyperglyceridemia: Secondary | ICD-10-CM

## 2020-07-12 DIAGNOSIS — E039 Hypothyroidism, unspecified: Secondary | ICD-10-CM

## 2020-07-12 DIAGNOSIS — Z789 Other specified health status: Secondary | ICD-10-CM

## 2020-07-12 DIAGNOSIS — I10 Essential (primary) hypertension: Secondary | ICD-10-CM

## 2020-07-12 LAB — COMPREHENSIVE METABOLIC PANEL
ALT: 44 U/L (ref 0–53)
AST: 36 U/L (ref 0–37)
Albumin: 4.3 g/dL (ref 3.5–5.2)
Alkaline Phosphatase: 53 U/L (ref 39–117)
BUN: 13 mg/dL (ref 6–23)
CO2: 28 mEq/L (ref 19–32)
Calcium: 9.4 mg/dL (ref 8.4–10.5)
Chloride: 99 mEq/L (ref 96–112)
Creatinine, Ser: 1.09 mg/dL (ref 0.40–1.50)
GFR: 73.91 mL/min (ref >60.00)
Glucose, Bld: 130 mg/dL — ABNORMAL HIGH (ref 70–99)
Potassium: 4.2 mEq/L (ref 3.5–5.1)
Sodium: 135 mEq/L (ref 135–145)
Total Bilirubin: 0.5 mg/dL (ref 0.2–1.2)
Total Protein: 7 g/dL (ref 6.0–8.3)

## 2020-07-12 LAB — CBC WITH DIFFERENTIAL/PLATELET
Basophils Absolute: 0 10*3/uL (ref 0.0–0.1)
Basophils Relative: 0.9 % (ref 0.0–3.0)
Eosinophils Absolute: 0.2 10*3/uL (ref 0.0–0.7)
Eosinophils Relative: 4.9 % (ref 0.0–5.0)
HCT: 43.9 % (ref 39.0–52.0)
Hemoglobin: 15 g/dL (ref 13.0–17.0)
Lymphocytes Relative: 22.5 % (ref 12.0–46.0)
Lymphs Abs: 1.1 10*3/uL (ref 0.7–4.0)
MCHC: 34.1 g/dL (ref 30.0–36.0)
MCV: 96.8 fl (ref 78.0–100.0)
Monocytes Absolute: 0.5 10*3/uL (ref 0.1–1.0)
Monocytes Relative: 10.9 % (ref 3.0–12.0)
Neutro Abs: 3.1 10*3/uL (ref 1.4–7.7)
Neutrophils Relative %: 60.8 % (ref 43.0–77.0)
Platelets: 222 10*3/uL (ref 150.0–400.0)
RBC: 4.54 Mil/uL (ref 4.22–5.81)
RDW: 12.3 % (ref 11.5–15.5)
WBC: 5 10*3/uL (ref 4.0–10.5)

## 2020-07-12 LAB — LIPID PANEL
Cholesterol: 149 mg/dL (ref 0–200)
HDL: 36 mg/dL — ABNORMAL LOW (ref >39.00)
NonHDL: 113.34
Total CHOL/HDL Ratio: 4
Triglycerides: 253 mg/dL — ABNORMAL HIGH (ref 0.0–149.0)
VLDL: 50.6 mg/dL — ABNORMAL HIGH (ref 0.0–40.0)

## 2020-07-12 LAB — PSA: PSA: 0.41 ng/mL (ref 0.10–4.00)

## 2020-07-12 LAB — LDL CHOLESTEROL, DIRECT: Direct LDL: 65 mg/dL

## 2020-07-12 LAB — VITAMIN B12: Vitamin B-12: 401 pg/mL (ref 211–911)

## 2020-07-12 LAB — FOLATE: Folate: 8.5 ng/mL (ref >5.9)

## 2020-07-12 LAB — TSH: TSH: 1.22 u[IU]/mL (ref 0.35–4.50)

## 2020-07-12 NOTE — Telephone Encounter (Signed)
Pt returning call.  States [facility] says they never got the refill.  Informed pt I will call in refill.  Pt expresses his thanks.   Refill left on vm at pharmacy.

## 2020-07-12 NOTE — Telephone Encounter (Signed)
Lvm asking pt to call back.  A refill for Valtrex was sent to [facility] on 06/26/20.

## 2020-07-12 NOTE — Addendum Note (Signed)
Addended by: Cloyd Stagers on: 07/12/2020 03:21 PM   Modules accepted: Orders

## 2020-07-13 LAB — MICROALBUMIN / CREATININE URINE RATIO
Creatinine, Urine: 94 mg/dL (ref 20–320)
Microalb Creat Ratio: 2 mcg/mg creat (ref <30)
Microalb, Ur: 0.2 mg/dL

## 2020-07-16 LAB — VITAMIN B1: Vitamin B1 (Thiamine): 10 nmol/L (ref 8–30)

## 2020-07-19 ENCOUNTER — Other Ambulatory Visit: Payer: Self-pay

## 2020-07-19 ENCOUNTER — Encounter: Payer: Self-pay | Admitting: Family Medicine

## 2020-07-19 ENCOUNTER — Ambulatory Visit (INDEPENDENT_AMBULATORY_CARE_PROVIDER_SITE_OTHER): Payer: 59 | Admitting: Family Medicine

## 2020-07-19 VITALS — BP 126/84 | HR 80 | Temp 97.6°F | Ht 70.0 in | Wt 211.2 lb

## 2020-07-19 DIAGNOSIS — N400 Enlarged prostate without lower urinary tract symptoms: Secondary | ICD-10-CM

## 2020-07-19 DIAGNOSIS — Z7289 Other problems related to lifestyle: Secondary | ICD-10-CM

## 2020-07-19 DIAGNOSIS — R059 Cough, unspecified: Secondary | ICD-10-CM | POA: Diagnosis not present

## 2020-07-19 DIAGNOSIS — R200 Anesthesia of skin: Secondary | ICD-10-CM

## 2020-07-19 DIAGNOSIS — Z0001 Encounter for general adult medical examination with abnormal findings: Secondary | ICD-10-CM | POA: Diagnosis not present

## 2020-07-19 DIAGNOSIS — I10 Essential (primary) hypertension: Secondary | ICD-10-CM

## 2020-07-19 DIAGNOSIS — Z8673 Personal history of transient ischemic attack (TIA), and cerebral infarction without residual deficits: Secondary | ICD-10-CM

## 2020-07-19 DIAGNOSIS — Z87891 Personal history of nicotine dependence: Secondary | ICD-10-CM

## 2020-07-19 DIAGNOSIS — M170 Bilateral primary osteoarthritis of knee: Secondary | ICD-10-CM

## 2020-07-19 DIAGNOSIS — F5104 Psychophysiologic insomnia: Secondary | ICD-10-CM | POA: Diagnosis not present

## 2020-07-19 DIAGNOSIS — M1120 Other chondrocalcinosis, unspecified site: Secondary | ICD-10-CM

## 2020-07-19 DIAGNOSIS — H3411 Central retinal artery occlusion, right eye: Secondary | ICD-10-CM

## 2020-07-19 DIAGNOSIS — F1090 Alcohol use, unspecified, uncomplicated: Secondary | ICD-10-CM

## 2020-07-19 DIAGNOSIS — E039 Hypothyroidism, unspecified: Secondary | ICD-10-CM

## 2020-07-19 DIAGNOSIS — Z85819 Personal history of malignant neoplasm of unspecified site of lip, oral cavity, and pharynx: Secondary | ICD-10-CM

## 2020-07-19 DIAGNOSIS — E781 Pure hyperglyceridemia: Secondary | ICD-10-CM

## 2020-07-19 DIAGNOSIS — G629 Polyneuropathy, unspecified: Secondary | ICD-10-CM

## 2020-07-19 MED ORDER — AZITHROMYCIN 250 MG PO TABS: ORAL_TABLET | ORAL | 0 refills | Status: DC

## 2020-07-19 MED ORDER — AMITRIPTYLINE HCL 50 MG PO TABS
75.0000 mg | ORAL_TABLET | Freq: Every day | ORAL | 3 refills | Status: DC
Start: 2020-07-19 — End: 2020-08-13

## 2020-07-19 MED ORDER — DENTAGEL 1.1 % DT GEL
1.0000 | Freq: Every day | DENTAL | 0 refills | Status: DC
Start: 2020-07-19 — End: 2021-05-28

## 2020-07-19 NOTE — Progress Notes (Signed)
Patient ID: Patrick Bailey., male    DOB: 1960-10-05, 59 y.o.   MRN: [id number]  This visit was conducted in person.  BP 126/84 (BP Location: Left Arm, Patient Position: Sitting, Cuff Size: Normal)   Pulse 80   Temp 97.6 F (36.4 C) (Temporal)   Ht 5\' 10"  (1.778 m)   Wt 211 lb 3 oz (95.8 kg)   SpO2 98%   BMI 30.30 kg/m    CC: CPE Subjective:   HPI: Patrick Bailey. is a 59 y.o. male presenting on 07/19/2020 for Annual Exam and Cough (C/o productive cough with green mucous and postnasal drip.  Sxs started about 5 days ago.  Requests zpack.)   5d h/o productive cough of green sputum as well as nasal congestion, head and chest congestion. Treating with mucinex, nyquil. No sick contacts at home. He is fully COVID vaccinated. Trouble sleeping at night due to this.   H/o throat cancer s/p surgery/radiation 2010 (rad onc Dr Marrion Coy, ENT Dr Jamey Reas - last seen several years ago)   Cerebellar CVA 07/2016 affecting gait with mild residual imbalance, has seen Dr [name] neurology. Sudden onset R upper field visual loss thought CRAO s/p hospitalization - now on plavix (has been taking both plavix and aspirin).   Recent R knee replacement surgery 02/2019. S/p L knee replacement 08/2017.   Preventative: COLONOSCOPY 03/2018 - mult TAs, diverticulosis, rpt 3 yrs (Nandigam)  Prostate cancer screening - discussed, no fmhx prostate cancer, declines. Lung cancer screening - not eligible  Flu shot - declines - planning next week  [place] 11/2019, 12/2019 Tdap 05/2014 Shingrix - discussed, declines Seat belt use discussed  Sunscreen use discussed. No changing moles on skin.  Ex smoker - quit 2010. <13 PY hx. Around other smokers. Alcohol - 3-4beers daily Dentist - regularly - some tooth grinding Eye exam - great recent report   Lives alone - separated from wife. 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 Interim medical history since our last visit reviewed. Allergies and medications reviewed and updated. Outpatient Medications Prior to Visit  Medication Sig Dispense Refill  . amLODipine (NORVASC) 10 MG tablet Take 1 tablet by mouth once daily 90 tablet 0  . atorvastatin (LIPITOR) 40 MG tablet Take 1 tablet by mouth once daily 90 tablet 0  . docusate sodium (COLACE) 100 MG capsule Take 1 capsule (100 mg total) by mouth 2 (two) times daily. To prevent constipation while taking pain medication. 30 capsule 0  . EUTHYROX 150 MCG tablet Take 1 tablet by mouth once daily 30 tablet 2  . fenofibrate 160 MG tablet Take 1 tablet (160 mg total) by mouth daily. 90 tablet 3  . finasteride (PROSCAR) 5 MG tablet Take by mouth.    . gabapentin (NEURONTIN) 300 MG capsule Take 1 capsule (300 mg total) by mouth 2 (two) times daily. 60 capsule 1  . metoprolol tartrate (LOPRESSOR) 100 MG tablet Take 1 tablet (100 mg total) by mouth 2 (two) times daily. 180 tablet 0  . MITIGARE 0.6 MG CAPS Take 0.6 mg by mouth daily as needed (gout flare). TAKE 1 CAPSULE BY MOUTH IN THE MORNING AS NEEDED    . Omega-3 Fatty Acids (FISH OIL) 1000 MG CAPS Take 2 capsules (2,000 mg total) by mouth 2 (two) times a day.  0  . pantoprazole (PROTONIX) 20 MG tablet Take 1 tablet (20 mg total) by mouth daily. 30 tablet 3  . valACYclovir (VALTREX) 1000 MG tablet TAKE 2 TABLETS BY MOUTH TWICE DAILY FOR  1  DAY PER EACH EPISODE 20 tablet 0  . vitamin B-12 (CYANOCOBALAMIN) 1000 MCG tablet Take 1 tablet (1,000 mcg total) by mouth daily.    Marland Kitchen amitriptyline (ELAVIL) 50 MG tablet Take 1 tablet (50 mg total) by mouth at bedtime. 90 tablet 1  . DENTAGEL 1.1 % GEL dental gel Place 1 application onto teeth at bedtime.     Marland Kitchen alfuzosin (UROXATRAL) 10 MG 24 hr tablet Take 10 mg by mouth daily.     No facility-administered medications  prior to visit.     Per HPI unless specifically indicated in ROS section below Review of Systems  Constitutional: Negative for activity change, appetite change, chills, fatigue, fever and unexpected weight change.  HENT: Negative for hearing loss.   Eyes: Negative for visual disturbance.  Respiratory: Positive for cough and wheezing. Negative for chest tightness and shortness of breath.   Cardiovascular: Positive for leg swelling. Negative for chest pain and palpitations.  Gastrointestinal: Negative for abdominal distention, abdominal pain, blood in stool, constipation, diarrhea, nausea and vomiting.  Genitourinary: Negative for difficulty urinating and hematuria.  Musculoskeletal: Negative for arthralgias, myalgias and neck pain.  Skin: Negative for rash.  Neurological: Negative for dizziness, seizures, syncope and headaches.  Hematological: Negative for adenopathy. Bruises/bleeds easily.  Psychiatric/Behavioral: Negative for dysphoric mood. The patient is not nervous/anxious.    Objective:  BP 126/84 (BP Location: Left Arm, Patient Position: Sitting, Cuff Size: Normal)   Pulse 80   Temp 97.6 F (36.4 C) (Temporal)   Ht 5\' 10"  (1.778 m)   Wt 211 lb 3 oz (95.8 kg)   SpO2 98%   BMI 30.30 kg/m   Wt Readings from Last 3 Encounters:  07/19/20 211 lb 3 oz (95.8 kg)  02/21/20 214 lb (97.1 kg)  01/16/20 213 lb 3 oz (96.7 kg)      Physical Exam Vitals and nursing note reviewed.  Constitutional:      General: He is not in acute distress.    Appearance: Normal appearance. He is well-developed and well-nourished. He is not ill-appearing.  HENT:     Head: Normocephalic and atraumatic.     Right Ear: Hearing, tympanic membrane, ear canal and external ear normal.     Left Ear: Hearing, tympanic membrane, ear canal and external ear normal.     Mouth/Throat:     Mouth: Oropharynx is clear and moist and mucous membranes are normal.     Pharynx: No posterior oropharyngeal edema.  Eyes:      General: No scleral icterus.    Extraocular Movements: Extraocular movements intact and EOM normal.     Conjunctiva/sclera: Conjunctivae normal.     Pupils: Pupils are equal, round, and reactive to light.  Neck:     Thyroid: No thyroid mass or thyromegaly.     Vascular: No carotid bruit.     Comments: Woody consistency to neck Cardiovascular:     Rate and Rhythm: Normal rate and regular rhythm.     Pulses: Normal pulses and intact distal pulses.          Radial pulses are 2+ on the right side and 2+ on the left side.     Heart sounds: Normal heart sounds. No murmur heard.   Pulmonary:     Effort: Pulmonary effort is  normal. No respiratory distress.     Breath sounds: No wheezing, rhonchi or rales.     Comments: Coarse breath sounds bibasilarly Abdominal:     General: Abdomen is flat. Bowel sounds are normal. There is no distension.     Palpations: Abdomen is soft. There is no mass.     Tenderness: There is no abdominal tenderness. There is no guarding or rebound.     Hernia: No hernia is present.  Musculoskeletal:        General: No edema. Normal range of motion.     Cervical back: Normal range of motion.     Right lower leg: No edema.     Left lower leg: No edema.  Lymphadenopathy:     Cervical: No cervical adenopathy.  Skin:    General: Skin is warm and dry.     Findings: No rash.  Neurological:     General: No focal deficit present.     Mental Status: He is alert and oriented to person, place, and time.     Comments: CN grossly intact, station and gait intact  Psychiatric:        Mood and Affect: Mood and affect and mood normal.        Behavior: Behavior normal.        Thought Content: Thought content normal.        Judgment: Judgment normal.       Results for orders placed or performed in visit on 07/12/20  Vitamin B1  Result Value Ref Range   Vitamin B1 (Thiamine) 10 8 - 30 nmol/L  Folate  Result Value Ref Range   Folate 8.5 >5.9 ng/mL  CBC with  Differential/Platelet  Result Value Ref Range   WBC 5.0 4.0 - 10.5 K/uL   RBC 4.54 4.22 - 5.81 Mil/uL   Hemoglobin 15.0 13.0 - 17.0 g/dL   HCT 43.9 39.0 - 52.0 %   MCV 96.8 78.0 - 100.0 fl   MCHC 34.1 30.0 - 36.0 g/dL   RDW 12.3 11.5 - 15.5 %   Platelets 222.0 150.0 - 400.0 K/uL   Neutrophils Relative % 60.8 43.0 - 77.0 %   Lymphocytes Relative 22.5 12.0 - 46.0 %   Monocytes Relative 10.9 3.0 - 12.0 %   Eosinophils Relative 4.9 0.0 - 5.0 %   Basophils Relative 0.9 0.0 - 3.0 %   Neutro Abs 3.1 1.4 - 7.7 K/uL   Lymphs Abs 1.1 0.7 - 4.0 K/uL   Monocytes Absolute 0.5 0.1 - 1.0 K/uL   Eosinophils Absolute 0.2 0.0 - 0.7 K/uL   Basophils Absolute 0.0 0.0 - 0.1 K/uL  PSA  Result Value Ref Range   PSA 0.41 0.10 - 4.00 ng/mL  Vitamin B12  Result Value Ref Range   Vitamin B-12 401 211 - 911 pg/mL  TSH  Result Value Ref Range   TSH 1.22 0.35 - 4.50 uIU/mL  Comprehensive metabolic panel  Result Value Ref Range   Sodium 135 135 - 145 mEq/L   Potassium 4.2 3.5 - 5.1 mEq/L   Chloride 99 96 - 112 mEq/L   CO2 28 19 - 32 mEq/L   Glucose, Bld 130 (H) 70 - 99 mg/dL   BUN 13 6 - 23 mg/dL   Creatinine, Ser 1.09 0.40 - 1.50 mg/dL   Total Bilirubin 0.5 0.2 - 1.2 mg/dL   Alkaline Phosphatase 53 39 - 117 U/L   AST 36 0 - 37 U/L   ALT 44 0 - 53 U/L  Total Protein 7.0 6.0 - 8.3 g/dL   Albumin 4.3 3.5 - 5.2 g/dL   GFR 73.91 >60.00 mL/min   Calcium 9.4 8.4 - 10.5 mg/dL  Lipid panel  Result Value Ref Range   Cholesterol 149 0 - 200 mg/dL   Triglycerides 253.0 (H) 0.0 - 149.0 mg/dL   HDL 36.00 (L) >39.00 mg/dL   VLDL 50.6 (H) 0.0 - 40.0 mg/dL   Total CHOL/HDL Ratio 4    NonHDL 113.34   Microalbumin / creatinine urine ratio  Result Value Ref Range   Creatinine, Urine 94 20 - 320 mg/dL   Microalb, Ur 0.2 mg/dL   Microalb Creat Ratio 2 <30 mcg/mg creat  LDL cholesterol, direct  Result Value Ref Range   Direct LDL 65.0 mg/dL   Assessment & Plan:  This visit occurred during the SARS-CoV-2  public health emergency.  Safety protocols were in place, including screening questions prior to the visit, additional usage of staff PPE, and extensive cleaning of exam room while observing appropriate contact time as indicated for disinfecting solutions.   Problem List Items Addressed This Visit    TOBACCO ABUSE, HX OF    Congratulated on continued abstinence.       Pseudogout    H/o this on imaging. Has not had crystal aspiration. Continue colchicine PRN.       Neuropathy    Mild neuropathy on NCS 2019.  Has been seeing neurology - pt frustrated with poor effect of treatments to date including gabapentin.  Recent lumbar and brain MRI largely unrevealing. Planning f/u with Dr [name], considering rpt NCS.       Left leg numbness    See below for periph neuropathy.  No h/o DM, B12 level stable, discussed possible contribution of chronic alcohol use to neuropathy - continue to encourage decrease drinking.       Hypothyroidism    TSH remains normal - continue 160mcg levothyroxine daily.       Hypertriglyceridemia    Chronic, stable on lipitor and fenofibrate. Also on fish oil 2 cap BID. Latest trig 253.  The ASCVD Risk score Mikey Bussing DC Jr., et al., 2013) failed to calculate for the following reasons:   The patient has a prior MI or stroke diagnosis       History of throat cancer - Primary    H/o tonsillar SCC s/p surgery, chemoradiation completed early 2010.  Has not recently seen onc or ENT (2016/2018).  Requests refill of fluoride dental gel (Dentagel) - states needs to use this daily. Will refill.       History of cerebellar stroke    Continue plavix, statin  Latest plan was to continue plavix monotherapy but he has continued DAPT - rec stop aspirin.       Habitual alcohol use    Reviewed healthy effects of continued alcohol use including possible relation to neuropathy - continues 3-4 beers/day. Encouraged decreased alcohol intake.       Essential hypertension     Chronic, stable. Continue current regimen.       Encounter for well adult exam with abnormal findings    Preventative protocols reviewed and updated unless pt declined. Discussed healthy diet and lifestyle.       Degenerative arthritis of knee, bilateral    S/p bilateral knee replacements.       Cough    Productive cough x 5 days in h/o ex smoker. Anticipate viral bronchitis. WASP zpack Rx sent in with indications when to fill. Discussed holding  colchicine if zpack filled.       Chronic insomnia    Notes amitriptyline losing effect - will increase dose to 75mg  nightly.       Central retinal artery occlusion of right eye    Surprisingly vision loss has resolved on latest eye exam per pt.  Continue plavix, statin.       BPH (benign prostatic hyperplasia)    Continue finasteride. PSA 0.41.          Meds ordered this encounter  Medications  . DENTAGEL 1.1 % GEL dental gel    Sig: Place 1 application onto teeth at bedtime.    Dispense:  120 mL    Refill:  0  . amitriptyline (ELAVIL) 50 MG tablet    Sig: Take 1.5 tablets (75 mg total) by mouth at bedtime.    Dispense:  135 tablet    Refill:  3  . azithromycin (ZITHROMAX) 250 MG tablet    Sig: Take two tablets on day one followed by one tablet on days 2-5    Dispense:  6 each    Refill:  0   No orders of the defined types were placed in this encounter.   Patient instructions: Try B12 again. Alcohol can cause neuropathy - back off alcohol.  Stop aspirin, continue plavix alone as blood thinner.  Cough may be viral - if that's the case, should improve over next few days. I've sent in zpack antibiotic to fill if fever, worsening cough, worsening one sided facial pain.  Good to see you today Return as needed or in 6 months for follow up visit.   Follow up plan: Return in about 6 months (around 01/17/2021) for follow up visit.  Ria Bush, MD

## 2020-07-19 NOTE — Patient Instructions (Addendum)
Try B12 again. Alcohol can cause neuropathy - back off alcohol.  Stop aspirin, continue plavix alone as blood thinner.  Cough may be viral - if that's the case, should improve over next few days. I've sent in zpack antibiotic to fill if fever, worsening cough, worsening one sided facial pain.  Good to see you today Return as needed or in 6 months for follow up visit.   Health Maintenance, Male Adopting a healthy lifestyle and getting preventive care are important in promoting health and wellness. Ask your health care provider about:  The right schedule for you to have regular tests and exams.  Things you can do on your own to prevent diseases and keep yourself healthy. What should I know about diet, weight, and exercise? Eat a healthy diet   Eat a diet that includes plenty of vegetables, fruits, low-fat dairy products, and lean protein.  Do not eat a lot of foods that are high in solid fats, added sugars, or sodium. Maintain a healthy weight Body mass index (BMI) is a measurement that can be used to identify possible weight problems. It estimates body fat based on height and weight. Your health care provider can help determine your BMI and help you achieve or maintain a healthy weight. Get regular exercise Get regular exercise. This is one of the most important things you can do for your health. Most adults should:  Exercise for at least 150 minutes each week. The exercise should increase your heart rate and make you sweat (moderate-intensity exercise).  Do strengthening exercises at least twice a week. This is in addition to the moderate-intensity exercise.  Spend less time sitting. Even light physical activity can be beneficial. Watch cholesterol and blood lipids Have your blood tested for lipids and cholesterol at 60 years of age, then have this test every 5 years. You may need to have your cholesterol levels checked more often if:  Your lipid or cholesterol levels are  high.  You are older than 60 years of age.  You are at high risk for heart disease. What should I know about cancer screening? Many types of cancers can be detected early and may often be prevented. Depending on your health history and family history, you may need to have cancer screening at various ages. This may include screening for:  Colorectal cancer.  Prostate cancer.  Skin cancer.  Lung cancer. What should I know about heart disease, diabetes, and high blood pressure? Blood pressure and heart disease  High blood pressure causes heart disease and increases the risk of stroke. This is more likely to develop in people who have high blood pressure readings, are of African descent, or are overweight.  Talk with your health care provider about your target blood pressure readings.  Have your blood pressure checked: ? Every 3-5 years if you are 88-11 years of age. ? Every year if you are 59 years old or older.  If you are between the ages of 56 and 24 and are a current or former smoker, ask your health care provider if you should have a one-time screening for abdominal aortic aneurysm (AAA). Diabetes Have regular diabetes screenings. This checks your fasting blood sugar level. Have the screening done:  Once every three years after age 74 if you are at a normal weight and have a low risk for diabetes.  More often and at a younger age if you are overweight or have a high risk for diabetes. What should I know about preventing  infection? Hepatitis B If you have a higher risk for hepatitis B, you should be screened for this virus. Talk with your health care provider to find out if you are at risk for hepatitis B infection. Hepatitis C Blood testing is recommended for:  Everyone born from [birth year] through 1966.  Anyone with known risk factors for hepatitis C. Sexually transmitted infections (STIs)  You should be screened each year for STIs, including gonorrhea and chlamydia,  if: ? You are sexually active and are younger than 60 years of age. ? You are older than 60 years of age and your health care provider tells you that you are at risk for this type of infection. ? Your sexual activity has changed since you were last screened, and you are at increased risk for chlamydia or gonorrhea. Ask your health care provider if you are at risk.  Ask your health care provider about whether you are at high risk for HIV. Your health care provider may recommend a prescription medicine to help prevent HIV infection. If you choose to take medicine to prevent HIV, you should first get tested for HIV. You should then be tested every 3 months for as long as you are taking the medicine. Follow these instructions at home: Lifestyle  Do not use any products that contain nicotine or tobacco, such as cigarettes, e-cigarettes, and chewing tobacco. If you need help quitting, ask your health care provider.  Do not use street drugs.  Do not share needles.  Ask your health care provider for help if you need support or information about quitting drugs. Alcohol use  Do not drink alcohol if your health care provider tells you not to drink.  If you drink alcohol: ? Limit how much you have to 0-2 drinks a day. ? Be aware of how much alcohol is in your drink. In the [place] one drink equals one 12 oz bottle of beer (355 mL), one 5 oz glass of wine (148 mL), or one 1 oz glass of hard liquor (44 mL). General instructions  Schedule regular health, dental, and eye exams.  Stay current with your vaccines.  Tell your health care provider if: ? You often feel depressed. ? You have ever been abused or do not feel safe at home. Summary  Adopting a healthy lifestyle and getting preventive care are important in promoting health and wellness.  Follow your health care provider's instructions about healthy diet, exercising, and getting tested or screened for diseases.  Follow your health care  provider's instructions on monitoring your cholesterol and blood pressure. This information is not intended to replace advice given to you by your health care provider. Make sure you discuss any questions you have with your health care provider. Document Revised: 07/13/2018 Document Reviewed: 07/13/2018 Elsevier Patient Education  2020 [employer].

## 2020-07-20 DIAGNOSIS — R200 Anesthesia of skin: Secondary | ICD-10-CM | POA: Insufficient documentation

## 2020-07-20 DIAGNOSIS — R059 Cough, unspecified: Secondary | ICD-10-CM | POA: Insufficient documentation

## 2020-07-20 NOTE — Assessment & Plan Note (Addendum)
Reviewed healthy effects of continued alcohol use including possible relation to neuropathy - continues 3-4 beers/day. Encouraged decreased alcohol intake.

## 2020-07-20 NOTE — Assessment & Plan Note (Signed)
Productive cough x 5 days in h/o ex smoker. Anticipate viral bronchitis. WASP zpack Rx sent in with indications when to fill. Discussed holding colchicine if zpack filled.

## 2020-07-20 NOTE — Assessment & Plan Note (Signed)
Mild neuropathy on NCS 2019.  Has been seeing neurology - pt frustrated with poor effect of treatments to date including gabapentin.  Recent lumbar and brain MRI largely unrevealing. Planning f/u with Dr [name], considering rpt NCS.

## 2020-07-20 NOTE — Assessment & Plan Note (Addendum)
H/o tonsillar SCC s/p surgery, chemoradiation completed early 2010.  Has not recently seen onc or ENT (2016/2018).  Requests refill of fluoride dental gel (Dentagel) - states needs to use this daily. Will refill.

## 2020-07-20 NOTE — Assessment & Plan Note (Addendum)
Continue plavix, statin  Latest plan was to continue plavix monotherapy but he has continued DAPT - rec stop aspirin.

## 2020-07-20 NOTE — Assessment & Plan Note (Signed)
Continue finasteride. PSA 0.41.

## 2020-07-20 NOTE — Assessment & Plan Note (Signed)
Chronic, stable on lipitor and fenofibrate. Also on fish oil 2 cap BID. Latest trig 253.  The ASCVD Risk score Mikey Bussing DC Jr., et al., 2013) failed to calculate for the following reasons:   The patient has a prior MI or stroke diagnosis

## 2020-07-20 NOTE — Assessment & Plan Note (Signed)
S/p bilateral knee replacements.

## 2020-07-20 NOTE — Assessment & Plan Note (Signed)
TSH remains normal - continue 168mcg levothyroxine daily.

## 2020-07-20 NOTE — Assessment & Plan Note (Signed)
Chronic, stable. Continue current regimen. 

## 2020-07-20 NOTE — Assessment & Plan Note (Signed)
Congratulated on continued abstinence 

## 2020-07-20 NOTE — Assessment & Plan Note (Signed)
Surprisingly vision loss has resolved on latest eye exam per pt.  Continue plavix, statin.

## 2020-07-20 NOTE — Assessment & Plan Note (Signed)
Notes amitriptyline losing effect - will increase dose to 75mg  nightly.

## 2020-07-20 NOTE — Assessment & Plan Note (Signed)
H/o this on imaging. Has not had crystal aspiration. Continue colchicine PRN.

## 2020-07-20 NOTE — Assessment & Plan Note (Signed)
Preventative protocols reviewed and updated unless pt declined. Discussed healthy diet and lifestyle.  

## 2020-07-20 NOTE — Assessment & Plan Note (Signed)
See below for periph neuropathy.  No h/o DM, B12 level stable, discussed possible contribution of chronic alcohol use to neuropathy - continue to encourage decrease drinking.

## 2020-07-22 ENCOUNTER — Telehealth: Payer: Self-pay | Admitting: Family Medicine

## 2020-07-22 NOTE — Telephone Encounter (Signed)
Spoke with pt notifying him Dr. Darnell Level sent a refill to [place] on 07/19/20.  Pt verbalizes understanding and offers his apology for the mix up.

## 2020-07-22 NOTE — Telephone Encounter (Signed)
He is out of his tooth gel and they said that Dr. Darnell Level rejected the medication and he said he has been without it for a week

## 2020-07-23 ENCOUNTER — Telehealth: Payer: Self-pay | Admitting: Family Medicine

## 2020-07-23 NOTE — Telephone Encounter (Signed)
Pt called in due to he is still coughing and wanted to know about getting something else.

## 2020-07-23 NOTE — Telephone Encounter (Signed)
Pt left v/m that his S/T was worse and pt has prod cough with green phlegm; in the v/m pt had the Zpak did not work and pt request different med. Pt had annual exam and cough for 5 days when seen on 07/19/20.

## 2020-07-24 ENCOUNTER — Other Ambulatory Visit: Payer: Self-pay | Admitting: Family Medicine

## 2020-07-24 MED ORDER — GUAIFENESIN-CODEINE 100-10 MG/5ML PO SYRP
5.0000 mL | ORAL_SOLUTION | Freq: Three times a day (TID) | ORAL | 0 refills | Status: DC | PRN
Start: 2020-07-24 — End: 2020-08-13

## 2020-07-24 NOTE — Telephone Encounter (Signed)
Denied refill, per Dr. Darnell Level.  (see TE, 07/23/20)

## 2020-07-24 NOTE — Telephone Encounter (Signed)
Zpack is long acting antibiotic, remains in his system for 2wks. This should cover bacterial infection of lungs and throat well. Don't recommend further abx at this time but give abx more time to work.  If cough is keeping him up at night would offer night time prescription cough syrup with codeine to help rest.  Otherwise recommend push fluids, get plenty of rest all this week, salt water gargles, tylenol for discomfort. Let us know if fever >101 or new symptoms develop.

## 2020-07-24 NOTE — Addendum Note (Signed)
Addended by: Ria Bush on: 07/24/2020 11:29 AM   Modules accepted: Orders

## 2020-07-24 NOTE — Telephone Encounter (Signed)
Spoke with pt relaying Dr. Synthia Innocent message.  Pt verbalizes understanding and agrees to trying cough syrup with codeine.  He expresses his thanks.

## 2020-07-24 NOTE — Telephone Encounter (Signed)
cheratussin sent in. 

## 2020-07-25 ENCOUNTER — Ambulatory Visit: Admit: 2020-07-25 | Discharge: 2020-07-26 | Payer: PRIVATE HEALTH INSURANCE

## 2020-07-25 DIAGNOSIS — C01 Malignant neoplasm of base of tongue: Principal | ICD-10-CM

## 2020-07-30 ENCOUNTER — Telehealth: Payer: Self-pay | Admitting: Family Medicine

## 2020-07-30 DIAGNOSIS — R059 Cough, unspecified: Secondary | ICD-10-CM

## 2020-07-30 NOTE — Telephone Encounter (Signed)
Pt called in he needs another prescription due to the one he is on he is coughing up green phlegm

## 2020-07-31 NOTE — Telephone Encounter (Signed)
Main symptoms are ongoing productive cough and mild wheeze. Check CXR. Consider steroid vs different cough syrup. Doubt need for further abx.

## 2020-07-31 NOTE — Addendum Note (Signed)
Addended by: Eustaquio Boyden on: 07/31/2020 03:30 PM   Modules accepted: Orders

## 2020-07-31 NOTE — Telephone Encounter (Signed)
plz triage patient.  Ongoing cough for 3 wks now.  What other symptoms is he having? Any fever dyspnea wheeze? Or just lingering productive cough? Was cheratussin ineffective?  If ongoing cough despite abx would offer CXR as next step.

## 2020-07-31 NOTE — Telephone Encounter (Signed)
Attempted to call pt.  No answer.  Vm box is full.  Need to get answers to Dr. Timoteo Expose questions and relay message.

## 2020-07-31 NOTE — Telephone Encounter (Signed)
Pt returning call.  States he's not having any other sxs other than some slight wheezing.  But mostly the cough and still coughing up green phlegm.  States the cough med helps enough to let him sleep a little.  Pt agrees to CXR and will come in tomorrow morning.

## 2020-08-01 ENCOUNTER — Ambulatory Visit (INDEPENDENT_AMBULATORY_CARE_PROVIDER_SITE_OTHER)
Admission: RE | Admit: 2020-08-01 | Discharge: 2020-08-01 | Disposition: A | Discharge status: Home / Self Care | Payer: 59 | Source: Ambulatory Visit | Attending: Family Medicine | Admitting: Family Medicine

## 2020-08-01 DIAGNOSIS — R059 Cough, unspecified: Secondary | ICD-10-CM | POA: Diagnosis not present

## 2020-08-02 ENCOUNTER — Other Ambulatory Visit: Payer: Self-pay | Admitting: Family Medicine

## 2020-08-02 MED ORDER — PREDNISONE 20 MG PO TABS: ORAL_TABLET | ORAL | 0 refills | Status: DC

## 2020-08-02 MED ORDER — ALBUTEROL SULFATE HFA 108 (90 BASE) MCG/ACT IN AERS: 2.0000 | INHALATION_SPRAY | Freq: Four times a day (QID) | RESPIRATORY_TRACT | 1 refills | Status: DC | PRN

## 2020-08-10 ENCOUNTER — Other Ambulatory Visit: Payer: Self-pay | Admitting: Family Medicine

## 2020-08-13 ENCOUNTER — Ambulatory Visit: Payer: 59 | Admitting: Neurology

## 2020-08-13 ENCOUNTER — Other Ambulatory Visit: Payer: Self-pay

## 2020-08-13 ENCOUNTER — Encounter: Payer: Self-pay | Admitting: Neurology

## 2020-08-13 VITALS — BP 130/74 | HR 64 | Ht 76.0 in | Wt 208.0 lb

## 2020-08-13 DIAGNOSIS — G629 Polyneuropathy, unspecified: Secondary | ICD-10-CM

## 2020-08-13 DIAGNOSIS — R200 Anesthesia of skin: Secondary | ICD-10-CM | POA: Diagnosis not present

## 2020-08-13 MED ORDER — GABAPENTIN 400 MG PO CAPS: 400.0000 mg | ORAL_CAPSULE | Freq: Two times a day (BID) | ORAL | 3 refills | Status: DC

## 2020-08-13 NOTE — Patient Instructions (Signed)
Will order nerve conduction  Increase gabapentin 400 mg twice daily  See you back in 6 months
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
## 2020-08-15 ENCOUNTER — Other Ambulatory Visit: Payer: Self-pay | Admitting: Family Medicine

## 2020-08-15 NOTE — Telephone Encounter (Signed)
Valtrex Last filled:  07/16/20, #20 Last OV:  07/19/20, CPE Next OV:  none

## 2020-08-16 ENCOUNTER — Other Ambulatory Visit: Payer: Self-pay | Admitting: Family Medicine

## 2020-08-16 NOTE — Progress Notes (Signed)
I have read the note, and I agree with the clinical assessment and plan.  Charles K Willis   

## 2020-08-20 ENCOUNTER — Other Ambulatory Visit: Payer: Self-pay | Admitting: Family Medicine

## 2020-08-21 NOTE — Telephone Encounter (Signed)
Denied refill.  Last rx sent 08/17/20.

## 2020-08-23 ENCOUNTER — Other Ambulatory Visit: Payer: Self-pay | Admitting: Family Medicine

## 2020-08-23 NOTE — Telephone Encounter (Signed)
Pharmacy requests refill on: Valacyclovir 1000 mg   LAST REFILL: 08/17/2020 (Q-20, R-0) LAST OV: 07/19/2020 NEXT OV: Not Scheduled  PHARMACY: [id number] [place], [place]

## 2020-08-23 NOTE — Telephone Encounter (Signed)
plz phone in due to E prescribing error.  

## 2020-08-30 ENCOUNTER — Other Ambulatory Visit: Payer: Self-pay | Admitting: Family Medicine

## 2020-08-30 MED ORDER — LEVOTHYROXINE SODIUM 150 MCG PO TABS: 150.0000 ug | ORAL_TABLET | Freq: Every day | ORAL | 11 refills | Status: DC

## 2020-08-30 MED ORDER — ATORVASTATIN CALCIUM 40 MG PO TABS: 40.0000 mg | ORAL_TABLET | Freq: Every day | ORAL | 3 refills | Status: DC

## 2020-09-02 ENCOUNTER — Encounter: Payer: 59 | Admitting: Neurology

## 2020-09-02 ENCOUNTER — Encounter: Payer: Self-pay | Admitting: Neurology

## 2020-09-09 MED ORDER — SODIUM FLUORIDE 1.1 % DENTAL GEL
Freq: Two times a day (BID) | TOPICAL | 7 refills | 28 days | Status: CP
Start: 2020-09-09 — End: ?

## 2020-09-15 ENCOUNTER — Other Ambulatory Visit: Payer: Self-pay | Admitting: Family Medicine

## 2020-09-17 ENCOUNTER — Encounter: Payer: Self-pay | Admitting: Neurology

## 2020-09-17 ENCOUNTER — Ambulatory Visit (INDEPENDENT_AMBULATORY_CARE_PROVIDER_SITE_OTHER): Payer: 59 | Admitting: Neurology

## 2020-09-17 ENCOUNTER — Ambulatory Visit: Payer: 59 | Admitting: Neurology

## 2020-09-17 DIAGNOSIS — R200 Anesthesia of skin: Secondary | ICD-10-CM | POA: Diagnosis not present

## 2020-09-17 DIAGNOSIS — G629 Polyneuropathy, unspecified: Secondary | ICD-10-CM

## 2020-09-17 NOTE — Progress Notes (Signed)
Please refer to EMG and nerve conduction procedure note.  

## 2020-09-17 NOTE — Progress Notes (Addendum)
The patient comes in for EMG and nerve conduction study evaluation today. The study looks about the same as the prior study done in July 2019, minimal neuropathy seen. No explanation for significant numbness in the left leg up to the hip and progressive numbness of the right leg below the knee.  I will set the patient up for MRI of the cervical and thoracic spine with and without gadolinium enhancement to look for possible syrinx or dural AVM that could explain progressive sensory symptoms in the legs.  I suppose that demyelinating disease is a possibility as well. The patient has had blood work done through his primary care physician recently in the last 2 to 3 months. Renal function is normal.

## 2020-09-17 NOTE — Procedures (Signed)
     HISTORY:  Patrick Bailey is a 60 year old gentleman with a history of onset of numbness of the entire right leg up to the hip level with loss of temperature sensation and numbness of the right foot that is now progressing up the right leg to the knee. The patient has had worsening of symptoms, and he is being reevaluated for these issues. He feels that the left leg is heavy.  NERVE CONDUCTION STUDIES:  Nerve conduction studies were performed on both lower extremities. The distal motor latencies for the peroneal and posterior tibial nerves were within normal limits bilaterally with low motor amplitudes for the right peroneal nerve and for the right posterior tibial nerves. The motor amplitudes for the left posterior tibial nerve and for the left peroneal nerve were borderline normal. Slowing was seen for the left peroneal nerve and for the right posterior tibial nerve, borderline normal nerve conduction velocities were seen for the right peroneal nerve and for the left posterior tibial nerve. The sural and peroneal sensory latencies were within normal limits bilaterally. The F-wave latencies for the posterior tibial nerves were within normal limits bilaterally.  EMG STUDIES:  EMG study was performed on the left lower extremity:  The tibialis anterior muscle reveals 2 to 4K motor units with full recruitment. No fibrillations or positive waves were seen. The peroneus tertius muscle reveals 2 to 4K motor units with full recruitment. No fibrillations or positive waves were seen. The medial gastrocnemius muscle reveals 1 to 3K motor units with full recruitment. No fibrillations or positive waves were seen. The vastus lateralis muscle reveals 2 to 4K motor units with full recruitment. No fibrillations or positive waves were seen. The iliopsoas muscle reveals 2 to 4K motor units with full recruitment. No fibrillations or positive waves were seen. The biceps femoris muscle (long head) reveals 2 to 4K  motor units with full recruitment. No fibrillations or positive waves were seen. The lumbosacral paraspinal muscles were tested at 3 levels, and revealed no abnormalities of insertional activity at all 3 levels tested. There was good relaxation.   IMPRESSION:  Nerve conduction studies done on both lower extremities shows evidence of a mild primarily motor neuropathy, similar to findings seen in 2019. EMG evaluation of the left lower extremity was relatively unremarkable, no evidence of an overlying lumbar radiculopathy was seen.  Jill Alexanders MD 09/17/2020 3:01 PM  [facility] [address] [place], [place] [address]  Phone [phone] Fax [phone]

## 2020-09-18 ENCOUNTER — Telehealth: Payer: Self-pay | Admitting: Neurology

## 2020-09-18 NOTE — Telephone Encounter (Signed)
[facility]: [id number] (exp. 09/18/20 to 11/02/20)  The Thoracic is pending. Uploaded notes on the portal.

## 2020-09-23 NOTE — Telephone Encounter (Signed)
[facility]: [id number] (exp. 09/20/20 to 11/04/20) order sent to GI. They will reach out to the patient to schedule.

## 2020-09-26 ENCOUNTER — Other Ambulatory Visit: Payer: Self-pay | Admitting: Family Medicine

## 2020-09-26 NOTE — Telephone Encounter (Signed)
Valtrex Last filled:  08/18/20, #20 Last OV:  07/19/20, CPE Next OV: none

## 2020-10-07 ENCOUNTER — Ambulatory Visit
Admission: RE | Admit: 2020-10-07 | Discharge: 2020-10-07 | Disposition: A | Discharge status: Home / Self Care | Payer: 59 | Source: Ambulatory Visit | Attending: Neurology | Admitting: Neurology

## 2020-10-07 DIAGNOSIS — R200 Anesthesia of skin: Secondary | ICD-10-CM

## 2020-10-07 MED ORDER — GADOBENATE DIMEGLUMINE 529 MG/ML IV SOLN
20.0000 mL | Freq: Once | INTRAVENOUS | Status: AC | PRN
  Administered 2020-10-07: 20 mL via INTRAVENOUS

## 2020-10-08 ENCOUNTER — Telehealth: Payer: Self-pay | Admitting: Neurology

## 2020-10-08 NOTE — Telephone Encounter (Signed)
  I called the patient.  MRI of the cervical spine shows potential for right C6 nerve root compression, otherwise no abnormalities of the spinal cord, no compression of the cord.  Thoracic MRI does not show spinal cord lesions, he has already had MRI of the lumbar spine, he has had MRI of the brain and EMG and nerve conduction study of the lower extremities.  The etiology of the progressive numbness of the legs is not clear from this evaluation.  MRI cervical 10/08/20:  IMPRESSION: This MRI of the cervical spine with and without contrast shows the following: 1.   At C3-C4, there is mild to moderate spinal stenosis due to degenerative changes but no nerve root compression.   2.   At C5-C6, there is a large disc osteophyte complex causing moderate spinal stenosis and severe right foraminal narrowing with probable right C6 nerve root compression. 3.   Milder degenerative changes as detailed above at other cervical levels that do not lead to spinal stenosis or nerve root compression. 4.   The spinal cord has normal signal. 5.   Increased fat signal within the C2 and C3 vertebral bodies likely represent sequela of prior radiation therapy.   MRI thoracic 10/08/20:  IMPRESSION: This MRI of the thoracic spine with and without contrast shows the following: 1.   The spinal cord appears normal. 2.   Multilevel degenerative changes as detailed above.  There did not appear to be nerve root compression.  There is mild spinal stenosis at T10-T11. 3.   Anterior endplate edema with enhancement at T9-T11.  This is most likely to represent degenerative changes.  However, due to his history of cancer, consider repeat imaging with contrast in the future to ensure stability.

## 2020-11-23 ENCOUNTER — Other Ambulatory Visit: Payer: Self-pay | Admitting: Family Medicine

## 2020-11-25 NOTE — Telephone Encounter (Signed)
Refill request Valtrex Last office visit 07/19/20 Last refill 09/27/20 #20/1

## 2020-12-26 ENCOUNTER — Other Ambulatory Visit: Payer: Self-pay | Admitting: *Deleted

## 2020-12-26 MED ORDER — GABAPENTIN 400 MG PO CAPS: 400.0000 mg | ORAL_CAPSULE | Freq: Two times a day (BID) | ORAL | 3 refills | Status: DC

## 2021-01-10 ENCOUNTER — Encounter: Payer: Self-pay | Admitting: Neurology

## 2021-01-10 ENCOUNTER — Ambulatory Visit: Payer: 59 | Admitting: Neurology

## 2021-01-10 VITALS — BP 147/88 | HR 76 | Ht 72.0 in | Wt 208.0 lb

## 2021-01-10 DIAGNOSIS — G629 Polyneuropathy, unspecified: Secondary | ICD-10-CM | POA: Diagnosis not present

## 2021-01-10 MED ORDER — GABAPENTIN 400 MG PO CAPS: ORAL_CAPSULE | ORAL | 1 refills | Status: DC

## 2021-01-10 MED ORDER — GABAPENTIN 400 MG PO CAPS: 400.0000 mg | ORAL_CAPSULE | Freq: Two times a day (BID) | ORAL | Status: DC

## 2021-01-10 MED ORDER — AMITRIPTYLINE HCL 50 MG PO TABS: 75.0000 mg | ORAL_TABLET | Freq: Every day | ORAL | Status: DC
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
## 2021-01-10 NOTE — Patient Instructions (Signed)
We will go up on the gabapentin 400 mg capsules. Start taking one capsule three times a day for 2 weeks, then begin taking one in the morning and midday, and 2 at night.  Neurontin (gabapentin) may result in drowsiness, ankle swelling, gait instability, or possibly dizziness. Please contact our office if significant side effects occur with this medication.

## 2021-02-13 ENCOUNTER — Ambulatory Visit: Payer: 59 | Admitting: Neurology

## 2021-03-10 DIAGNOSIS — S52509A Unspecified fracture of the lower end of unspecified radius, initial encounter for closed fracture: Secondary | ICD-10-CM | POA: Insufficient documentation

## 2021-03-10 DIAGNOSIS — S52512A Displaced fracture of left radial styloid process, initial encounter for closed fracture: Secondary | ICD-10-CM | POA: Diagnosis not present

## 2021-03-13 ENCOUNTER — Other Ambulatory Visit: Payer: Self-pay | Admitting: Orthopedic Surgery

## 2021-03-13 DIAGNOSIS — S52512A Displaced fracture of left radial styloid process, initial encounter for closed fracture: Secondary | ICD-10-CM

## 2021-03-14 ENCOUNTER — Telehealth: Payer: Self-pay
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
## 2021-03-14 NOTE — Telephone Encounter (Signed)
Noted. Thank you for speaking with him.  

## 2021-03-14 NOTE — Telephone Encounter (Signed)
CT notes below, they do a great job at [facility], but I am always happy to talk him and evaluate.   "Displaced fracture of left radial styloid process, initial encounter for closed fracture"

## 2021-03-15 ENCOUNTER — Other Ambulatory Visit: Payer: Self-pay | Admitting: Family Medicine

## 2021-03-17 ENCOUNTER — Telehealth: Payer: Self-pay | Admitting: Family Medicine

## 2021-03-17 ENCOUNTER — Other Ambulatory Visit: Payer: Self-pay

## 2021-03-17 ENCOUNTER — Ambulatory Visit (INDEPENDENT_AMBULATORY_CARE_PROVIDER_SITE_OTHER): Payer: BC Managed Care – PPO | Admitting: Family Medicine

## 2021-03-17 ENCOUNTER — Encounter: Payer: Self-pay | Admitting: Family Medicine

## 2021-03-17 VITALS — BP 120/84 | HR 71 | Temp 97.9°F | Ht 70.0 in | Wt 207.0 lb

## 2021-03-17 DIAGNOSIS — S52352A Displaced comminuted fracture of shaft of radius, left arm, initial encounter for closed fracture: Secondary | ICD-10-CM

## 2021-03-17 MED ORDER — HYDROCODONE-ACETAMINOPHEN 5-325 MG PO TABS: 1.0000 | ORAL_TABLET | Freq: Four times a day (QID) | ORAL | 0 refills | Status: DC | PRN

## 2021-03-17 MED ORDER — FENOFIBRATE 160 MG PO TABS: 160.0000 mg | ORAL_TABLET | Freq: Every day | ORAL | 0 refills | Status: DC
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
## 2021-03-17 NOTE — Telephone Encounter (Signed)
Patient is overdue for follow up with Dr Darnell Level. Please schedule Thank you

## 2021-03-17 NOTE — Telephone Encounter (Signed)
Refill sent in.  (See Refill, 03/15/21)

## 2021-03-17 NOTE — Telephone Encounter (Signed)
Pt scheduled f/u on 03/24/21 at 10:00.  (See Phn note, 03/17/21)  E-scribed refill.

## 2021-03-17 NOTE — Telephone Encounter (Signed)
Pt wants to know if medication can be refilled since appt has been made for 8/22 at 10?

## 2021-03-20 DIAGNOSIS — S52512A Displaced fracture of left radial styloid process, initial encounter for closed fracture: Secondary | ICD-10-CM | POA: Diagnosis not present

## 2021-03-21 ENCOUNTER — Other Ambulatory Visit: Payer: Self-pay | Admitting: Family Medicine

## 2021-03-21 NOTE — Telephone Encounter (Signed)
Valtrex Last filled:  01/14/21, #20 Last OV:  07/19/20 CPE Next OV:  03/24/21, f/u

## 2021-03-24 ENCOUNTER — Ambulatory Visit: Payer: BC Managed Care – PPO | Admitting: Family Medicine

## 2021-03-24 ENCOUNTER — Encounter: Payer: Self-pay | Admitting: Family Medicine

## 2021-03-24 ENCOUNTER — Other Ambulatory Visit: Payer: Self-pay

## 2021-03-24 VITALS — BP 132/80 | HR 71 | Temp 97.4°F | Ht 70.0 in | Wt 204.0 lb

## 2021-03-24 DIAGNOSIS — B001 Herpesviral vesicular dermatitis: Secondary | ICD-10-CM

## 2021-03-24 DIAGNOSIS — S52352D Displaced comminuted fracture of shaft of radius, left arm, subsequent encounter for closed fracture with routine healing: Secondary | ICD-10-CM | POA: Diagnosis not present

## 2021-03-24 DIAGNOSIS — G6289 Other specified polyneuropathies: Secondary | ICD-10-CM | POA: Diagnosis not present

## 2021-03-24 DIAGNOSIS — F5104 Psychophysiologic insomnia: Secondary | ICD-10-CM

## 2021-03-24 DIAGNOSIS — R059 Cough, unspecified: Secondary | ICD-10-CM

## 2021-03-24 MED ORDER — VALACYCLOVIR HCL 1 G PO TABS: ORAL_TABLET | ORAL | 3 refills | Status: DC

## 2021-03-24 MED ORDER — OMEPRAZOLE 20 MG PO CPDR: 20.0000 mg | DELAYED_RELEASE_CAPSULE | Freq: Every day | ORAL | 3 refills | Status: DC

## 2021-03-24 MED ORDER — AZITHROMYCIN 250 MG PO TABS: ORAL_TABLET | ORAL | 0 refills | Status: DC

## 2021-03-24 NOTE — Patient Instructions (Addendum)
Call [facility] to ask about follow up plan for wrist fracture.  Zpack for productive cough. Push fluids and rest, continue plain mucinex with large glass of water to break up mucous.  Try omeprazole in place of pantoprazole - sent to pharmacy. May also try over the counter pepcid '20mg'$  nightly to help with acid reflux, may have less gassiness side effect. Let me know how you do with that.

## 2021-03-24 NOTE — Assessment & Plan Note (Signed)
Ongoing productive cough x2wks, progressively worsening per patient, failing outpatient treatment of mucinex and cough syrup. Will Rx zpack to cover atypical bronchitis given duration and progression of symptoms, discussed continued mucinex, fluids, rest. Pt agrees with plan.

## 2021-03-24 NOTE — Assessment & Plan Note (Addendum)
Ongoing, followed by neurology. Appreciate their care.  Doing better since gabapentin dose increased to 400/400/'800mg'$ . Also continue amitriptyline '25mg'$  nightly.

## 2021-03-24 NOTE — Assessment & Plan Note (Addendum)
Appreciate sports med and ortho care.  Now in a cast.  He again isn't aware of ortho f/u planned - he will call today to ask about f/u plan.

## 2021-03-24 NOTE — Assessment & Plan Note (Signed)
Notes improvement on amitriptyline + gabapentin '800mg'$  nightly.

## 2021-03-24 NOTE — Assessment & Plan Note (Signed)
Valtrex effective - refilled today.
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
## 2021-03-27 DIAGNOSIS — S52512A Displaced fracture of left radial styloid process, initial encounter for closed fracture: Secondary | ICD-10-CM | POA: Diagnosis not present

## 2021-03-28 ENCOUNTER — Ambulatory Visit: Payer: BC Managed Care – PPO

## 2021-04-03 ENCOUNTER — Telehealth: Payer: Self-pay | Admitting: Family Medicine

## 2021-04-03 NOTE — Telephone Encounter (Signed)
Attempted to contact pt.  Vm box is full.  Need to relay Dr. Synthia Innocent message.

## 2021-04-03 NOTE — Telephone Encounter (Signed)
I see patient is scheduled to  see me tomorrow for wrist f/u - plan was to f/u with ortho Dr Peggye Ley sometime next month. Can we call and see what issues pt is having? I don't know how much more i'd be able to help him with OV - this is something he should f/u with ortho for.

## 2021-04-04 ENCOUNTER — Encounter: Payer: Self-pay | Admitting: Family Medicine

## 2021-04-04 ENCOUNTER — Other Ambulatory Visit: Payer: Self-pay

## 2021-04-04 ENCOUNTER — Ambulatory Visit: Payer: BC Managed Care – PPO | Admitting: Family Medicine

## 2021-04-04 VITALS — BP 170/96 | HR 83 | Temp 97.7°F | Ht 70.0 in | Wt 204.4 lb

## 2021-04-04 DIAGNOSIS — R059 Cough, unspecified: Secondary | ICD-10-CM | POA: Diagnosis not present

## 2021-04-04 DIAGNOSIS — I10 Essential (primary) hypertension: Secondary | ICD-10-CM | POA: Diagnosis not present

## 2021-04-04 MED ORDER — GUAIFENESIN-CODEINE 100-10 MG/5ML PO SYRP: 5.0000 mL | ORAL_SOLUTION | Freq: Three times a day (TID) | ORAL | 0 refills | Status: DC | PRN

## 2021-04-04 MED ORDER — BENZONATATE 100 MG PO CAPS: 100.0000 mg | ORAL_CAPSULE | Freq: Three times a day (TID) | ORAL | 0 refills | Status: DC | PRN

## 2021-04-04 NOTE — Telephone Encounter (Signed)
Spoke with pt relaying Dr. Synthia Innocent message.  Pt verbalizes understanding but was coming in due to ongoing cough.  Says he's still coughing up green mucous despite finishing Z-Pak.  Advised pt to come on in, per Dr. Darnell Level.  Pt verbalizes understanding.

## 2021-04-04 NOTE — Assessment & Plan Note (Signed)
BP elevation noted - pt was rushing to get here this morning. Prior readings were normal. No changes today.

## 2021-04-04 NOTE — Progress Notes (Signed)
Patient ID: Keyth Kupec., male    DOB: 1960-09-13, 60 y.o.   MRN: [id number]  This visit was conducted in person.  BP (!) 170/96   Pulse 83   Temp 97.7 F (36.5 C) (Temporal)   Ht '5\' 10"'$  (1.778 m)   Wt 204 lb 6 oz (92.7 kg)   SpO2 98%   BMI 29.32 kg/m   BP Readings from Last 3 Encounters:  04/04/21 (!) 170/96  03/24/21 132/80  03/17/21 120/84  Patient was rushing to get here today.   CC: cough Subjective:   HPI: Rizwan Mammone. is a 60 y.o. male presenting on 04/04/2021 for Cough (C/o ongoing cough despite abx tx.  Still coughing up green mucous. )   Seen 10d ago with 2 wks of chest congestion with cough productive of green colored mucous, progressively worsening with intermittent dyspnea.  Had previously tried mucinex and other OTC cough syrups without avail.  Treated for atypical bronchitis with zpack.   No h/o asthma. Ex smoker - quit years ago.   Notes ongoing cough despite taking zpack last week - still no fevers/chills, SOB,chest pain or wheezing. Cough is worse at night time.  Notes some PNDRainage - feels this is where cough is coming from.  No abd pain, nausea.      Relevant past medical, surgical, family and social history reviewed and updated as indicated. Interim medical history since our last visit reviewed. Allergies and medications reviewed and updated. Outpatient Medications Prior to Visit  Medication Sig Dispense Refill   amitriptyline (ELAVIL) 50 MG tablet Take 1.5 tablets (75 mg total) by mouth at bedtime.     amLODipine (NORVASC) 10 MG tablet Take 1 tablet by mouth once daily 90 tablet 3   atorvastatin (LIPITOR) 40 MG tablet Take 1 tablet (40 mg total) by mouth daily. 90 tablet 3   cyclobenzaprine (FLEXERIL) 10 MG tablet Take 10 mg by mouth 2 (two) times daily as needed.     DENTAGEL 1.1 % GEL dental gel Place 1 application onto teeth at bedtime. 120 mL 0   fenofibrate 160 MG tablet Take 1 tablet (160 mg total) by mouth daily. 90 tablet 0    gabapentin (NEURONTIN) 400 MG capsule One capsule twice during the day and 2 at night 360 capsule 1   HYDROcodone-acetaminophen (NORCO/VICODIN) 5-325 MG tablet Take 1 tablet by mouth every 6 (six) hours as needed for moderate pain or severe pain. 20 tablet 0   levothyroxine (EUTHYROX) 150 MCG tablet Take 1 tablet (150 mcg total) by mouth daily. 30 tablet 11   metoprolol tartrate (LOPRESSOR) 100 MG tablet Take 1 tablet by mouth twice daily 180 tablet 0   MITIGARE 0.6 MG CAPS Take 0.6 mg by mouth daily as needed (gout flare). TAKE 1 CAPSULE BY MOUTH IN THE MORNING AS NEEDED     Omega-3 Fatty Acids (FISH OIL) 1000 MG CAPS Take 2 capsules (2,000 mg total) by mouth 2 (two) times a day.  0   omeprazole (PRILOSEC) 20 MG capsule Take 1 capsule (20 mg total) by mouth daily. 90 capsule 3   valACYclovir (VALTREX) 1000 MG tablet TAKE 2 TABLETS BY MOUTH TWICE DAILY FOR ONE DAY per EPISODE 20 tablet 3   azithromycin (ZITHROMAX) 250 MG tablet Take two tablets on day one followed by one tablet on days 2-5 6 each 0   No facility-administered medications prior to visit.     Per HPI unless specifically indicated in ROS section below  Review of Systems  Objective:  BP (!) 170/96   Pulse 83   Temp 97.7 F (36.5 C) (Temporal)   Ht '5\' 10"'$  (1.778 m)   Wt 204 lb 6 oz (92.7 kg)   SpO2 98%   BMI 29.32 kg/m   Wt Readings from Last 3 Encounters:  04/04/21 204 lb 6 oz (92.7 kg)  03/24/21 204 lb (92.5 kg)  03/17/21 207 lb (93.9 kg)      Physical Exam Vitals and nursing note reviewed.  Constitutional:      Appearance: Normal appearance. He is not ill-appearing.  HENT:     Head: Normocephalic and atraumatic.     Nose: Congestion present. No mucosal edema or rhinorrhea.     Right Turbinates: Not enlarged.     Left Turbinates: Not enlarged.     Right Sinus: No maxillary sinus tenderness or frontal sinus tenderness.     Left Sinus: No maxillary sinus tenderness or frontal sinus tenderness.     Comments:  L  nasal mucosal erythema Pale R nasal mucosa with some edema    Mouth/Throat:     Mouth: Mucous membranes are moist.     Pharynx: Oropharynx is clear. No oropharyngeal exudate or posterior oropharyngeal erythema.  Eyes:     Extraocular Movements: Extraocular movements intact.     Conjunctiva/sclera: Conjunctivae normal.     Pupils: Pupils are equal, round, and reactive to light.  Cardiovascular:     Rate and Rhythm: Normal rate and regular rhythm.     Pulses: Normal pulses.     Heart sounds: Normal heart sounds. No murmur heard. Pulmonary:     Effort: Pulmonary effort is normal. No respiratory distress.     Breath sounds: Normal breath sounds. No wheezing, rhonchi or rales.  Musculoskeletal:     Cervical back: Normal range of motion and neck supple. No rigidity.     Right lower leg: No edema.     Left lower leg: No edema.  Lymphadenopathy:     Cervical: No cervical adenopathy.  Skin:    General: Skin is warm and dry.     Findings: No rash.  Neurological:     Mental Status: He is alert.  Psychiatric:        Mood and Affect: Mood normal.        Behavior: Behavior normal.      Results for orders placed or performed in visit on 07/12/20  Vitamin B1  Result Value Ref Range   Vitamin B1 (Thiamine) 10 8 - 30 nmol/L  Folate  Result Value Ref Range   Folate 8.5 >5.9 ng/mL  CBC with Differential/Platelet  Result Value Ref Range   WBC 5.0 4.0 - 10.5 K/uL   RBC 4.54 4.22 - 5.81 Mil/uL   Hemoglobin 15.0 13.0 - 17.0 g/dL   HCT 43.9 39.0 - 52.0 %   MCV 96.8 78.0 - 100.0 fl   MCHC 34.1 30.0 - 36.0 g/dL   RDW 12.3 11.5 - 15.5 %   Platelets 222.0 150.0 - 400.0 K/uL   Neutrophils Relative % 60.8 43.0 - 77.0 %   Lymphocytes Relative 22.5 12.0 - 46.0 %   Monocytes Relative 10.9 3.0 - 12.0 %   Eosinophils Relative 4.9 0.0 - 5.0 %   Basophils Relative 0.9 0.0 - 3.0 %   Neutro Abs 3.1 1.4 - 7.7 K/uL   Lymphs Abs 1.1 0.7 - 4.0 K/uL   Monocytes Absolute 0.5 0.1 - 1.0 K/uL   Eosinophils  Absolute 0.2 0.0 -  0.7 K/uL   Basophils Absolute 0.0 0.0 - 0.1 K/uL  PSA  Result Value Ref Range   PSA 0.41 0.10 - 4.00 ng/mL  Vitamin B12  Result Value Ref Range   Vitamin B-12 401 211 - 911 pg/mL  TSH  Result Value Ref Range   TSH 1.22 0.35 - 4.50 uIU/mL  Comprehensive metabolic panel  Result Value Ref Range   Sodium 135 135 - 145 mEq/L   Potassium 4.2 3.5 - 5.1 mEq/L   Chloride 99 96 - 112 mEq/L   CO2 28 19 - 32 mEq/L   Glucose, Bld 130 (H) 70 - 99 mg/dL   BUN 13 6 - 23 mg/dL   Creatinine, Ser 1.09 0.40 - 1.50 mg/dL   Total Bilirubin 0.5 0.2 - 1.2 mg/dL   Alkaline Phosphatase 53 39 - 117 U/L   AST 36 0 - 37 U/L   ALT 44 0 - 53 U/L   Total Protein 7.0 6.0 - 8.3 g/dL   Albumin 4.3 3.5 - 5.2 g/dL   GFR 73.91 >60.00 mL/min   Calcium 9.4 8.4 - 10.5 mg/dL  Lipid panel  Result Value Ref Range   Cholesterol 149 0 - 200 mg/dL   Triglycerides 253.0 (H) 0.0 - 149.0 mg/dL   HDL 36.00 (L) >39.00 mg/dL   VLDL 50.6 (H) 0.0 - 40.0 mg/dL   Total CHOL/HDL Ratio 4    NonHDL 113.34   Microalbumin / creatinine urine ratio  Result Value Ref Range   Creatinine, Urine 94 20 - 320 mg/dL   Microalb, Ur 0.2 mg/dL   Microalb Creat Ratio 2 <30 mcg/mg creat  LDL cholesterol, direct  Result Value Ref Range   Direct LDL 65.0 mg/dL   Assessment & Plan:  This visit occurred during the SARS-CoV-2 public health emergency.  Safety protocols were in place, including screening questions prior to the visit, additional usage of staff PPE, and extensive cleaning of exam room while observing appropriate contact time as indicated for disinfecting solutions.   Problem List Items Addressed This Visit     Essential hypertension    BP elevation noted - pt was rushing to get here this morning. Prior readings were normal. No changes today.       Cough - Primary    Ongoing productive cough for 3+ wks now, s/p zpack treatment. No other symptoms, overall feels well. No signs of ongoing bacterial infection.  Anticipate post-infectious cough. Discussed with patient. Discussed prednisone course - he prefers to avoid given current healing wrist fracture. Will Rx cheratussin cough syrup as well as tessalon perls. Discussed flonase/nasal saline irrigation PRN. Update if not improved with this.         Meds ordered this encounter  Medications   benzonatate (TESSALON) 100 MG capsule    Sig: Take 1 capsule (100 mg total) by mouth 3 (three) times daily as needed for cough.    Dispense:  30 capsule    Refill:  0   guaiFENesin-codeine (ROBITUSSIN AC) 100-10 MG/5ML syrup    Sig: Take 5 mLs by mouth 3 (three) times daily as needed for cough or congestion (sedation precautions).    Dispense:  120 mL    Refill:  0    No orders of the defined types were placed in this encounter.    Patient Instructions  I don't see ongoing bacterial infection - the zpack took care of this.  I think you have persistent lung inflammation/irritation causing mucous production.  Treat with tessalon perls during  the day and codeine cough syrup for night time.  May use flonase nasal steroid and nasal saline to irrigate nasal passage (both are over the counter).  Give this another 1-2 weeks and would expect seeng improvement.  Follow up plan: Return if symptoms worsen or fail to improve.  Ria Bush, MD

## 2021-04-04 NOTE — Assessment & Plan Note (Signed)
Ongoing productive cough for 3+ wks now, s/p zpack treatment. No other symptoms, overall feels well. No signs of ongoing bacterial infection. Anticipate post-infectious cough. Discussed with patient. Discussed prednisone course - he prefers to avoid given current healing wrist fracture. Will Rx cheratussin cough syrup as well as tessalon perls. Discussed flonase/nasal saline irrigation PRN. Update if not improved with this.

## 2021-04-04 NOTE — Patient Instructions (Signed)
I don't see ongoing bacterial infection - the zpack took care of this.  I think you have persistent lung inflammation/irritation causing mucous production.  Treat with tessalon perls during the day and codeine cough syrup for night time.  May use flonase nasal steroid and nasal saline to irrigate nasal passage (both are over the counter).  Give this another 1-2 weeks and would expect seeng improvement.

## 2021-04-08 DIAGNOSIS — S52512A Displaced fracture of left radial styloid process, initial encounter for closed fracture: Secondary | ICD-10-CM | POA: Diagnosis not present

## 2021-04-14 ENCOUNTER — Other Ambulatory Visit: Payer: Self-pay

## 2021-04-14 ENCOUNTER — Ambulatory Visit
Admission: RE | Admit: 2021-04-14 | Discharge: 2021-04-14 | Disposition: A | Discharge status: Home / Self Care | Payer: BC Managed Care – PPO | Source: Ambulatory Visit | Attending: Orthopedic Surgery | Admitting: Orthopedic Surgery

## 2021-04-14 DIAGNOSIS — S52512A Displaced fracture of left radial styloid process, initial encounter for closed fracture: Secondary | ICD-10-CM

## 2021-04-22 DIAGNOSIS — S52512A Displaced fracture of left radial styloid process, initial encounter for closed fracture: Secondary | ICD-10-CM | POA: Diagnosis not present

## 2021-04-24 ENCOUNTER — Other Ambulatory Visit: Payer: Self-pay | Admitting: Family Medicine

## 2021-04-24 NOTE — Telephone Encounter (Signed)
Last office visit 09/02/20222 for cough.  Plavix is not on his current medication list.

## 2021-04-25 ENCOUNTER — Other Ambulatory Visit: Payer: Self-pay | Admitting: Family Medicine

## 2021-04-25 NOTE — Telephone Encounter (Signed)
Plavix Last OV:  04/04/21, cough Next OV:  07/30/21, CPE

## 2021-04-25 NOTE — Telephone Encounter (Signed)
Pt called in stated he going out of town and need a refill   Encourage patient to contact the pharmacy for refills or they can request refills through [place]:  Please schedule appointment if longer than 1 year  NEXT APPOINTMENT DATE:  MEDICATION:clopidogrel (PLAVIX) 75 MG tablet   Is the patient out of medication?   [place], [place] - [place] [address]  Let patient know to contact pharmacy at the end of the day to make sure medication is ready.  Please notify patient to allow 48-72 hours to process  CLINICAL FILLS OUT ALL BELOW:   LAST REFILL:  QTY:  REFILL DATE:    OTHER COMMENTS:    Okay for refill?  Please advise

## 2021-04-26 MED ORDER — LANSOPRAZOLE 15 MG PO CPDR: 15.0000 mg | DELAYED_RELEASE_CAPSULE | Freq: Every day | ORAL | 1 refills | Status: DC

## 2021-04-26 NOTE — Telephone Encounter (Signed)
He should be on plavix daily. Refilled. Plavix interacts with omeprazole. Omeprazole was started 03/2021 in place of protonix due to increased gassiness attributed to protonix.  Plz call - is he doing beetter with gassiness? If so, will stay off pantoprazole but recommend different PPI given he's on plavix. Recommend start prevacid 15mg  daily PPI in its place.

## 2021-04-29 NOTE — Telephone Encounter (Signed)
Spoke with pt relaying Dr. Synthia Innocent message.  Pt verbalizes understanding.  States he no longer take omeprazole.  Says the gassiness is better and he will take the Prevacid.

## 2021-05-01 ENCOUNTER — Encounter: Payer: Self-pay | Admitting: Gastroenterology

## 2021-05-02 DIAGNOSIS — S52512A Displaced fracture of left radial styloid process, initial encounter for closed fracture: Secondary | ICD-10-CM | POA: Diagnosis not present

## 2021-05-05 ENCOUNTER — Other Ambulatory Visit: Payer: Self-pay | Admitting: Family Medicine

## 2021-05-05 DIAGNOSIS — S52512A Displaced fracture of left radial styloid process, initial encounter for closed fracture: Secondary | ICD-10-CM | POA: Diagnosis not present

## 2021-05-07 DIAGNOSIS — S52512A Displaced fracture of left radial styloid process, initial encounter for closed fracture: Secondary | ICD-10-CM | POA: Diagnosis not present

## 2021-05-15 ENCOUNTER — Ambulatory Visit: Payer: BC Managed Care – PPO | Admitting: Family Medicine

## 2021-05-16 DIAGNOSIS — S52512A Displaced fracture of left radial styloid process, initial encounter for closed fracture: Secondary | ICD-10-CM | POA: Diagnosis not present

## 2021-05-23 ENCOUNTER — Other Ambulatory Visit: Payer: Self-pay | Admitting: Family Medicine

## 2021-05-23 NOTE — Telephone Encounter (Signed)
Pt called stating that he wants a call back regarding this medication

## 2021-05-23 NOTE — Telephone Encounter (Signed)
Attempted to contact pt.  Vm box is full.  Need to find out what pt needs concerning med.

## 2021-05-27 NOTE — Telephone Encounter (Signed)
Pt called wanting to get the [employer] get to put enamel back on his teeth so his teeth want decay.Pharmacy has not received request yet to fill

## 2021-05-28 NOTE — Telephone Encounter (Signed)
ERx 

## 2021-06-03 DIAGNOSIS — M19032 Primary osteoarthritis, left wrist: Secondary | ICD-10-CM | POA: Diagnosis not present

## 2021-06-09 ENCOUNTER — Other Ambulatory Visit: Payer: Self-pay | Admitting: Family Medicine

## 2021-06-15 ENCOUNTER — Other Ambulatory Visit: Payer: Self-pay | Admitting: Family Medicine

## 2021-06-18 ENCOUNTER — Other Ambulatory Visit: Payer: Self-pay | Admitting: Family Medicine

## 2021-07-01 DIAGNOSIS — M67432 Ganglion, left wrist: Secondary | ICD-10-CM | POA: Diagnosis not present

## 2021-07-03 ENCOUNTER — Telehealth: Payer: Self-pay | Admitting: Family Medicine

## 2021-07-03 NOTE — Telephone Encounter (Signed)
Elizabeth from [facility] called in stated she needed to speak to someone regarding pt medications . But hung up before anyone can take the call

## 2021-07-03 NOTE — Telephone Encounter (Signed)
Received faxed Anticoagulant/Antiplatelet Peri-Op Mgmt form from [facility] [P:  [phone]; F:  [phone] of [place] form in Dr. Synthia Innocent box.

## 2021-07-07 NOTE — Telephone Encounter (Signed)
Faxed form.    Spoke with pt relaying Dr. Synthia Innocent message.  Verbalizes understanding and expresses his thanks for the call.

## 2021-07-07 NOTE — Telephone Encounter (Signed)
On plavix 75mg  daily for indication of h/o cerebellar stroke >12 months ago.  Ok to hold plavix for 5 days, recommend bridging with aspirin 81mg  daily while off plavix, then restart when able per surgeon's discretion.  Filled and in Lisa's box

## 2021-07-08 ENCOUNTER — Telehealth: Payer: Self-pay | Admitting: Family Medicine

## 2021-07-08 NOTE — Telephone Encounter (Signed)
Left message on voicemail for Patrick Bailey to call the office back.

## 2021-07-08 NOTE — Telephone Encounter (Signed)
Elizabeth with [facility] called wanting to talk about medication changes concerning blood thinners for pt.

## 2021-07-08 NOTE — Telephone Encounter (Signed)
Patrick Bailey notified as instructed by telephone and verbalized understanding.

## 2021-07-08 NOTE — Telephone Encounter (Signed)
Recommend bridging with aspirin while off plavix. Don't think he needs lovenox.  Thanks

## 2021-07-08 NOTE — Telephone Encounter (Signed)
Spoke to [place] by telephone and was advised that the anesthesiologist  wants to confirm that the patient is to stop the Plavix and will be bridging with Aspirin until he starts back on Plavix. Benjamine Mola stated that they usually bridge with Lovenox and wants to confirm what Dr. Danise Mina recommends that the patient bridge with Aspirin. Benjamine Mola stated that patient is coming in soon for an appointment and wants to confirm this ASAP.

## 2021-07-10 ENCOUNTER — Telehealth: Payer: Self-pay

## 2021-07-10 DIAGNOSIS — E890 Postprocedural hypothyroidism: Secondary | ICD-10-CM

## 2021-07-10 NOTE — Telephone Encounter (Addendum)
Received fax from [facility] stating manufacturer change for levothyroxine.  They're asking can to change to new manufacturer.

## 2021-07-10 NOTE — Telephone Encounter (Signed)
Ok to change. Plz notify patient and schedule lab visit in 2 months to check levels on new manufacturer

## 2021-07-10 NOTE — Addendum Note (Signed)
Addended by: Ria Bush on: 07/10/2021 09:35 PM   Modules accepted: Orders

## 2021-07-11 NOTE — Telephone Encounter (Signed)
Spoke with Patrick Bailey them Dr. Darnell Level agrees to change manufacturer.  Says they will document and fill for pt.   Spoke with pt informing him of the change.  Verbalizes understanding and schedule 2 mo lab visit on 09/12/20 at 8:10.

## 2021-07-23 ENCOUNTER — Other Ambulatory Visit: Payer: Self-pay | Admitting: Family Medicine

## 2021-07-30 ENCOUNTER — Ambulatory Visit: Payer: BC Managed Care – PPO | Admitting: Family Medicine

## 2021-08-01 ENCOUNTER — Ambulatory Visit: Payer: BC Managed Care – PPO | Admitting: Family Medicine

## 2021-08-04 ENCOUNTER — Other Ambulatory Visit: Payer: Self-pay | Admitting: Family Medicine

## 2021-08-05 NOTE — Telephone Encounter (Signed)
Last refill/update was done by Dr [name].

## 2021-08-05 NOTE — Telephone Encounter (Signed)
Patient states he hasn't slept in 3 days and needs tamitriptyline (ELAVIL) 50 MG tablet his medication.

## 2021-08-05 NOTE — Telephone Encounter (Signed)
°  Encourage patient to contact the pharmacy for refills or they can request refills through [place]:  Please schedule appointment if longer than 1 year  NEXT APPOINTMENT DATE:  MEDICATION:  amitriptyline (ELAVIL) 50 MG tablet  Is the patient out of medication? yes  PHARMACY: [facility] - [address]   Let patient know to contact pharmacy at the end of the day to make sure medication is ready.  Please notify patient to allow 48-72 hours to process  CLINICAL FILLS OUT ALL BELOW:   LAST REFILL:  QTY:  REFILL DATE:    OTHER COMMENTS:    Okay for refill?  Please advise

## 2021-08-06 NOTE — Telephone Encounter (Signed)
Patient notified as instructed by telephone. Patient stated that he does not have any flexeril  to take.

## 2021-08-06 NOTE — Telephone Encounter (Addendum)
plz notify this was sent in for him.  However he should not take flexeril muscle relaxant if he's using amitriptyline.

## 2021-08-12 ENCOUNTER — Telehealth: Payer: Self-pay | Admitting: Family Medicine

## 2021-08-12 NOTE — Telephone Encounter (Signed)
Mr. Okane called in and stated that he does not have his medication amLODipine (NORVASC) 10 MG tablet. It shows it was sent on 1/3 to [facility] but [facility] stated that they do not have the medciation. Is it possible to send another script in due to he is needing the medication

## 2021-08-12 NOTE — Telephone Encounter (Signed)
[facility] and they have Rx ready for pickup. Spoke to pt and notified him that it's ready. Pt appreciative of the call.

## 2021-08-22 ENCOUNTER — Other Ambulatory Visit: Payer: Self-pay | Admitting: Family Medicine

## 2021-08-22 NOTE — Telephone Encounter (Signed)
Valtrex Last filled: 07/23/21, #20 Last OV:  04/04/21, cough Next OV:  11/12/21, CPE

## 2021-08-26 ENCOUNTER — Other Ambulatory Visit: Payer: Self-pay

## 2021-08-26 MED ORDER — GABAPENTIN 400 MG PO CAPS: ORAL_CAPSULE | ORAL | 0 refills | Status: DC

## 2021-09-01 ENCOUNTER — Encounter: Payer: Self-pay | Admitting: Adult Health

## 2021-09-01 ENCOUNTER — Ambulatory Visit: Payer: BC Managed Care – PPO | Admitting: Adult Health

## 2021-09-05 ENCOUNTER — Other Ambulatory Visit: Payer: Self-pay | Admitting: Family Medicine

## 2021-09-09 DIAGNOSIS — Z0289 Encounter for other administrative examinations: Secondary | ICD-10-CM

## 2021-09-12 ENCOUNTER — Other Ambulatory Visit: Payer: BC Managed Care – PPO

## 2021-09-12 ENCOUNTER — Other Ambulatory Visit: Payer: Self-pay | Admitting: Family Medicine

## 2021-09-14 ENCOUNTER — Other Ambulatory Visit: Payer: Self-pay | Admitting: Family Medicine

## 2021-09-19 ENCOUNTER — Other Ambulatory Visit: Payer: Self-pay

## 2021-09-19 ENCOUNTER — Other Ambulatory Visit (INDEPENDENT_AMBULATORY_CARE_PROVIDER_SITE_OTHER): Payer: BC Managed Care – PPO

## 2021-09-19 DIAGNOSIS — E890 Postprocedural hypothyroidism: Secondary | ICD-10-CM

## 2021-09-19 LAB — TSH: TSH: 4.32 u[IU]/mL (ref 0.35–5.50)

## 2021-09-19 LAB — T4, FREE: Free T4: 0.8 ng/dL (ref 0.60–1.60)

## 2021-09-21 ENCOUNTER — Other Ambulatory Visit: Payer: Self-pay | Admitting: Family Medicine

## 2021-09-30 ENCOUNTER — Other Ambulatory Visit: Payer: Self-pay | Admitting: Adult Health

## 2021-10-03 DIAGNOSIS — H25043 Posterior subcapsular polar age-related cataract, bilateral: Secondary | ICD-10-CM | POA: Diagnosis not present

## 2021-10-22 ENCOUNTER — Ambulatory Visit: Payer: BC Managed Care – PPO | Admitting: Family Medicine

## 2021-10-27 ENCOUNTER — Other Ambulatory Visit: Payer: Self-pay | Admitting: Neurology

## 2021-10-27 ENCOUNTER — Other Ambulatory Visit: Payer: Self-pay | Admitting: Family Medicine

## 2021-10-27 NOTE — Telephone Encounter (Signed)
Refill request Valtrex ?Last refill 08/22/21 #20 ?Last office visit 04/04/21 ?Upcoming appointment 11/12/21 ?

## 2021-10-27 NOTE — Telephone Encounter (Signed)
ERx 

## 2021-10-28 NOTE — Telephone Encounter (Signed)
Rx refilled.

## 2021-10-31 ENCOUNTER — Telehealth: Payer: Self-pay

## 2021-10-31 ENCOUNTER — Ambulatory Visit
Admission: RE | Admit: 2021-10-31 | Discharge: 2021-10-31 | Disposition: A | Discharge status: Home / Self Care | Payer: BC Managed Care – PPO | Source: Ambulatory Visit | Attending: Family | Admitting: Family

## 2021-10-31 ENCOUNTER — Other Ambulatory Visit: Payer: Self-pay | Admitting: Family

## 2021-10-31 ENCOUNTER — Ambulatory Visit: Payer: BC Managed Care – PPO | Admitting: Family

## 2021-10-31 ENCOUNTER — Encounter: Payer: Self-pay | Admitting: Family

## 2021-10-31 VITALS — BP 150/92 | HR 62 | Temp 97.9°F | Resp 16 | Ht 70.0 in | Wt 212.2 lb

## 2021-10-31 DIAGNOSIS — M79651 Pain in right thigh: Secondary | ICD-10-CM | POA: Insufficient documentation

## 2021-10-31 DIAGNOSIS — M5416 Radiculopathy, lumbar region: Secondary | ICD-10-CM

## 2021-10-31 DIAGNOSIS — I739 Peripheral vascular disease, unspecified: Secondary | ICD-10-CM

## 2021-10-31 DIAGNOSIS — M79661 Pain in right lower leg: Secondary | ICD-10-CM | POA: Insufficient documentation

## 2021-10-31 DIAGNOSIS — G6289 Other specified polyneuropathies: Secondary | ICD-10-CM

## 2021-10-31 NOTE — Telephone Encounter (Signed)
Called patient reviewed all information and repeated back to me. Will call if any questions.  ? ?

## 2021-10-31 NOTE — Assessment & Plan Note (Signed)
R/o dvt with stat u/s venous doppler ordered. Pending results. ?

## 2021-10-31 NOTE — Progress Notes (Signed)
Ultrasound was negative for blood clot.  ?Due to pain with walking, neuropathy/radiation of pain could also be from lower back. I have ordered a low back xray to rule out pinched nerve or something else contributing to pain so you can come by office to get xray at your leisure.  ? ?I am also referring you to vascular surgeon as discussed in the visit, if you do not hear anything next week let me know.  ? ?You may have pulled a muscle, heat/ice on sit. Tylenol as needed. Recommend salon pass patches/lidocaine patch. Rest as able. Stretching as able.

## 2021-10-31 NOTE — Assessment & Plan Note (Signed)
R/o dvt ?Stat u/s venous doppler ordered pt headed to [facility] now to get done ?If negative, will consider urgent referral to vascular to r/o circulatory concern ?If any cp sob go to er immediately and or call 911 ?
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
## 2021-10-31 NOTE — Patient Instructions (Addendum)
I have ordered a stat diagnostic test for you at [facility].  ?They added you on at 11:00, arrive at 10:45  ? ?[place] in [place] ? ?[place] ? ?Across from [facility] ? ?It was a pleasure seeing you today! Please do not hesitate to reach out with any questions and or concerns. ? ?Regards,  ? ?Jeraldean Wechter ?FNP-C ? ? ?

## 2021-10-31 NOTE — Assessment & Plan Note (Signed)
Pt with neuropathy, may be exacerbated. R/o dvt, then consider neuropathy management.  ?continue gabapentin 400 mg ?

## 2021-11-02 ENCOUNTER — Other Ambulatory Visit: Payer: Self-pay | Admitting: Neurology

## 2021-11-03 ENCOUNTER — Ambulatory Visit (INDEPENDENT_AMBULATORY_CARE_PROVIDER_SITE_OTHER)
Admission: RE | Admit: 2021-11-03 | Discharge: 2021-11-03 | Disposition: A | Discharge status: Home / Self Care | Payer: BC Managed Care – PPO | Source: Ambulatory Visit | Attending: Family | Admitting: Family

## 2021-11-03 DIAGNOSIS — M5416 Radiculopathy, lumbar region: Secondary | ICD-10-CM

## 2021-11-03 DIAGNOSIS — M5136 Other intervertebral disc degeneration, lumbar region: Secondary | ICD-10-CM | POA: Diagnosis not present

## 2021-11-03 DIAGNOSIS — R200 Anesthesia of skin: Secondary | ICD-10-CM | POA: Diagnosis not present

## 2021-11-03 DIAGNOSIS — M48061 Spinal stenosis, lumbar region without neurogenic claudication: Secondary | ICD-10-CM | POA: Diagnosis not present

## 2021-11-06 ENCOUNTER — Other Ambulatory Visit: Payer: Self-pay | Admitting: Family

## 2021-11-06 DIAGNOSIS — M47816 Spondylosis without myelopathy or radiculopathy, lumbar region: Secondary | ICD-10-CM | POA: Insufficient documentation

## 2021-11-06 DIAGNOSIS — M4726 Other spondylosis with radiculopathy, lumbar region: Secondary | ICD-10-CM

## 2021-11-06 NOTE — Progress Notes (Signed)
The lower back xray did show some changes. There was arthritis, but also facet hypertrophy at the lower back which can cause pinched nerve which would likely cause the radiation of pain you feel. I have referred you to neurosurgery. If you do not hear anything in the next 1-2 weeks let me know.

## 2021-11-11 ENCOUNTER — Emergency Department (HOSPITAL_COMMUNITY)
Admission: EM | Admit: 2021-11-11 | Discharge: 2021-11-11 | Disposition: A | Discharge status: Home / Self Care | Payer: BC Managed Care – PPO | Attending: Emergency Medicine | Admitting: Emergency Medicine

## 2021-11-11 ENCOUNTER — Emergency Department (HOSPITAL_COMMUNITY): Payer: BC Managed Care – PPO

## 2021-11-11 ENCOUNTER — Other Ambulatory Visit: Payer: Self-pay

## 2021-11-11 ENCOUNTER — Encounter (HOSPITAL_COMMUNITY): Payer: Self-pay | Admitting: Emergency Medicine

## 2021-11-11 DIAGNOSIS — K353 Acute appendicitis with localized peritonitis, without perforation or gangrene: Secondary | ICD-10-CM | POA: Diagnosis not present

## 2021-11-11 DIAGNOSIS — R1084 Generalized abdominal pain: Secondary | ICD-10-CM | POA: Diagnosis not present

## 2021-11-11 DIAGNOSIS — Z7902 Long term (current) use of antithrombotics/antiplatelets: Secondary | ICD-10-CM | POA: Insufficient documentation

## 2021-11-11 DIAGNOSIS — I723 Aneurysm of iliac artery: Secondary | ICD-10-CM | POA: Diagnosis not present

## 2021-11-11 DIAGNOSIS — I700 Atherosclerosis of aorta: Secondary | ICD-10-CM | POA: Diagnosis not present

## 2021-11-11 DIAGNOSIS — K769 Liver disease, unspecified: Secondary | ICD-10-CM | POA: Diagnosis not present

## 2021-11-11 DIAGNOSIS — R079 Chest pain, unspecified: Secondary | ICD-10-CM | POA: Diagnosis not present

## 2021-11-11 DIAGNOSIS — R140 Abdominal distension (gaseous): Secondary | ICD-10-CM | POA: Diagnosis not present

## 2021-11-11 DIAGNOSIS — K7689 Other specified diseases of liver: Secondary | ICD-10-CM | POA: Diagnosis not present

## 2021-11-11 DIAGNOSIS — R109 Unspecified abdominal pain: Secondary | ICD-10-CM | POA: Diagnosis not present

## 2021-11-11 DIAGNOSIS — R1010 Upper abdominal pain, unspecified: Secondary | ICD-10-CM | POA: Diagnosis not present

## 2021-11-11 DIAGNOSIS — K388 Other specified diseases of appendix: Secondary | ICD-10-CM | POA: Diagnosis not present

## 2021-11-11 DIAGNOSIS — Z743 Need for continuous supervision: Secondary | ICD-10-CM | POA: Diagnosis not present

## 2021-11-11 DIAGNOSIS — Z1211 Encounter for screening for malignant neoplasm of colon: Secondary | ICD-10-CM

## 2021-11-11 DIAGNOSIS — R1013 Epigastric pain: Secondary | ICD-10-CM | POA: Diagnosis not present

## 2021-11-11 DIAGNOSIS — R1011 Right upper quadrant pain: Secondary | ICD-10-CM | POA: Diagnosis not present

## 2021-11-11 DIAGNOSIS — R6889 Other general symptoms and signs: Secondary | ICD-10-CM | POA: Diagnosis not present

## 2021-11-11 LAB — CBC
HCT: 44.7 % (ref 39.0–52.0)
Hemoglobin: 15.5 g/dL (ref 13.0–17.0)
MCH: 32.4 pg (ref 26.0–34.0)
MCHC: 34.7 g/dL (ref 30.0–36.0)
MCV: 93.3 fL (ref 80.0–100.0)
Platelets: 258 10*3/uL (ref 150–400)
RBC: 4.79 MIL/uL (ref 4.22–5.81)
RDW: 11.8 % (ref 11.5–15.5)
WBC: 9.3 10*3/uL (ref 4.0–10.5)
nRBC: 0 % (ref 0.0–0.2)

## 2021-11-11 LAB — URINALYSIS, ROUTINE W REFLEX MICROSCOPIC
Bilirubin Urine: NEGATIVE
Glucose, UA: NEGATIVE mg/dL
Hgb urine dipstick: NEGATIVE
Ketones, ur: NEGATIVE mg/dL
Leukocytes,Ua: NEGATIVE
Nitrite: NEGATIVE
Protein, ur: 30 mg/dL — AB
Specific Gravity, Urine: 1.014 (ref 1.005–1.030)
pH: 6 (ref 5.0–8.0)

## 2021-11-11 LAB — COMPREHENSIVE METABOLIC PANEL
ALT: 32 U/L (ref 0–44)
AST: 27 U/L (ref 15–41)
Albumin: 4.4 g/dL (ref 3.5–5.0)
Alkaline Phosphatase: 64 U/L (ref 38–126)
Anion gap: 9 (ref 5–15)
BUN: 11 mg/dL (ref 8–23)
CO2: 27 mmol/L (ref 22–32)
Calcium: 9.7 mg/dL (ref 8.9–10.3)
Chloride: 96 mmol/L — ABNORMAL LOW (ref 98–111)
Creatinine, Ser: 0.97 mg/dL (ref 0.61–1.24)
GFR, Estimated: >60 mL/min (ref >60)
Glucose, Bld: 129 mg/dL — ABNORMAL HIGH (ref 70–99)
Potassium: 4.3 mmol/L (ref 3.5–5.1)
Sodium: 132 mmol/L — ABNORMAL LOW (ref 135–145)
Total Bilirubin: 0.7 mg/dL (ref 0.3–1.2)
Total Protein: 8.1 g/dL (ref 6.5–8.1)

## 2021-11-11 LAB — LIPASE, BLOOD: Lipase: 24 U/L (ref 11–51)

## 2021-11-11 LAB — TROPONIN I (HIGH SENSITIVITY)
Troponin I (High Sensitivity): 5 ng/L (ref <18)
Troponin I (High Sensitivity): 4 ng/L (ref <18)

## 2021-11-11 MED ORDER — IOHEXOL 350 MG/ML SOLN
100.0000 mL | Freq: Once | INTRAVENOUS | Status: AC | PRN
  Administered 2021-11-11: 100 mL via INTRAVENOUS

## 2021-11-11 MED ORDER — HYDROMORPHONE HCL 1 MG/ML IJ SOLN
1.0000 mg | Freq: Once | INTRAMUSCULAR | Status: AC
  Administered 2021-11-11: 1 mg via INTRAVENOUS
  Filled 2021-11-11: qty 1

## 2021-11-11 MED ORDER — SODIUM CHLORIDE 0.9 % IV BOLUS
1000.0000 mL | Freq: Once | INTRAVENOUS | Status: AC
  Administered 2021-11-11: 1000 mL via INTRAVENOUS

## 2021-11-11 MED ORDER — ONDANSETRON HCL 4 MG/2ML IJ SOLN
4.0000 mg | Freq: Once | INTRAMUSCULAR | Status: AC | PRN
  Administered 2021-11-11: 4 mg via INTRAVENOUS
  Filled 2021-11-11: qty 2

## 2021-11-11 NOTE — ED Provider Notes (Signed)
?[facility] ?Provider Note ? ? ?CSN: [id number] ?Arrival date & time: 11/11/21  0807 ? ?  ? ?History ? ?Chief Complaint  ?Patient presents with  ? Abdominal Pain  ? ? ?Patrick Bailey. is a 61 y.o. male. ? ?HPI ?61 year old male presents with acute abdominal pain.  Started around 1 AM and woke him up from sleep.  It is pretty much diffuse.  He vomited twice and no longer has anything else to vomit.  No fevers reported.  He had diarrhea yesterday afternoon x2 then he was feeling better and was able to eat dinner last night without difficulty.  No back pain.  He transiently had chest pain or shortness of breath this morning but he thinks that was due to the degree of pain he had.  Right now his pain is still present and feels like something try to come out of his abdomen. ? ?Home Medications ?Prior to Admission medications   ?Medication Sig Start Date End Date Taking? Authorizing Provider  ?amitriptyline (ELAVIL) 50 MG tablet TAKE 1 & 1/2 (ONE & ONE-HALF) TABLETS BY MOUTH AT BEDTIME 08/06/21   Ria Bush, MD  ?amLODipine (NORVASC) 10 MG tablet Take 1 tablet by mouth once daily 08/05/21   Ria Bush, MD  ?atorvastatin (LIPITOR) 40 MG tablet Take 1 tablet by mouth once daily 09/10/21   Ria Bush, MD  ?benzonatate (TESSALON) 100 MG capsule Take 1 capsule (100 mg total) by mouth 3 (three) times daily as needed for cough. 04/04/21   Ria Bush, MD  ?clopidogrel (PLAVIX) 75 MG tablet Take 1 tablet by mouth once daily 04/26/21   Ria Bush, MD  ?fenofibrate 160 MG tablet Take 1 tablet by mouth once daily 09/15/21   Ria Bush, MD  ?gabapentin (NEURONTIN) 400 MG capsule TAKE 1 CAPSULE BY MOUTH TWICE DAILY AND 2 AT BEDTIME 10/28/21   Alric Ran, MD  ?guaiFENesin-codeine (ROBITUSSIN AC) 100-10 MG/5ML syrup Take 5 mLs by mouth 3 (three) times daily as needed for cough or congestion (sedation precautions). 04/04/21   Ria Bush, MD   ?HYDROcodone-acetaminophen (NORCO/VICODIN) 5-325 MG tablet Take 1 tablet by mouth every 6 (six) hours as needed for moderate pain or severe pain. 03/17/21   Copland, Frederico Hamman, MD  ?lansoprazole (PREVACID) 15 MG capsule Take 1 capsule (15 mg total) by mouth daily at 12 noon. 04/26/21   Ria Bush, MD  ?levothyroxine (SYNTHROID) 150 MCG tablet Take 1 tablet by mouth once daily 09/15/21   Ria Bush, MD  ?metoprolol tartrate (LOPRESSOR) 100 MG tablet Take 1 tablet by mouth twice daily 09/16/21   Ria Bush, MD  ?[facility] 0.6 MG CAPS Take 0.6 mg by mouth daily as needed (gout flare). TAKE 1 CAPSULE BY MOUTH IN THE MORNING AS NEEDED 01/12/20   Enzo Bi, MD  ?Omega-3 Fatty Acids (FISH OIL) 1000 MG CAPS Take 2 capsules (2,000 mg total) by mouth 2 (two) times a day. 01/27/19   Ria Bush, MD  ?sodium fluoride (DENTAGEL) 1.1 % GEL dental gel Place 1 application onto teeth at bedtime. 05/28/21   Ria Bush, MD  ?valACYclovir (VALTREX) 1000 MG tablet TAKE 2 TABLETS BY MOUTH TWICE DAILY FOR  1  DAY  PER  EPISODE 10/27/21   Ria Bush, MD  ?   ? ?Allergies    ?Patient has no known allergies.   ? ?Review of Systems   ?Review of Systems  ?Constitutional:  Negative for fever.  ?Respiratory:  Positive for shortness of breath.   ?  Cardiovascular:  Positive for chest pain.  ?Gastrointestinal:  Positive for abdominal pain, diarrhea, nausea and vomiting.  ?Genitourinary:  Negative for dysuria.  ? ?Physical Exam ?Updated Vital Signs ?BP (!) 158/99   Pulse 97   Temp 97.7 ?F (36.5 ?C) (Oral)   Resp 16   SpO2 96%  ?Physical Exam ?Vitals and nursing note reviewed.  ?Constitutional:   ?   General: He is not in acute distress. ?   Appearance: He is well-developed. He is not ill-appearing or diaphoretic.  ?HENT:  ?   Head: Normocephalic and atraumatic.  ?Cardiovascular:  ?   Rate and Rhythm: Normal rate and regular rhythm.  ?   Heart sounds: Normal heart sounds.  ?Pulmonary:  ?   Effort: Pulmonary effort  is normal.  ?   Breath sounds: Normal breath sounds.  ?Abdominal:  ?   Palpations: Abdomen is soft.  ?   Tenderness: There is abdominal tenderness in the right upper quadrant and left upper quadrant.  ?Skin: ?   General: Skin is warm and dry.  ?Neurological:  ?   Mental Status: He is alert.  ? ? ?ED Results / Procedures / Treatments   ?Labs ?(all labs ordered are listed, but only abnormal results are displayed) ?Labs Reviewed  ?COMPREHENSIVE METABOLIC PANEL - Abnormal; Notable for the following components:  ?    Result Value  ? Sodium 132 (*)   ? Chloride 96 (*)   ? Glucose, Bld 129 (*)   ? All other components within normal limits  ?URINALYSIS, ROUTINE W REFLEX MICROSCOPIC - Abnormal; Notable for the following components:  ? Protein, ur 30 (*)   ? Bacteria, UA RARE (*)   ? All other components within normal limits  ?LIPASE, BLOOD  ?CBC  ?TROPONIN I (HIGH SENSITIVITY)  ?TROPONIN I (HIGH SENSITIVITY)  ? ? ?EKG ?EKG Interpretation ? ?Date/Time:  Tuesday November 11 2021 08:00:02 EDT ?Ventricular Rate:  85 ?PR Interval:  156 ?QRS Duration: 86 ?QT Interval:  402 ?QTC Calculation: 478 ?R Axis:   23 ?Text Interpretation: Normal sinus rhythm Septal infarct , age undetermined Abnormal ECG When compared with ECG of 11-Jan-2020 16:43, PREVIOUS ECG IS PRESENT Non-specific ST-t changes v2-v5 Confirmed by Pattricia Boss [phone]) on 11/11/2021 8:32:16 AM ? ?Radiology ?DG Chest 2 View ? ?Result Date: 11/11/2021 ?CLINICAL DATA:  Epigastric pain beginning today. EXAM: CHEST - 2 VIEW COMPARISON:  08/01/2020 FINDINGS: Heart size is normal. There is atherosclerotic calcification and tortuosity of the aorta. There are chronic appearing interstitial pulmonary markings. No sign of infiltrate, collapse or effusion. Ordinary degenerative changes affect the spine. IMPRESSION: Chronic interstitial lung markings.  No acute finding. Electronically Signed   By: Nelson Chimes M.D.   On: 11/11/2021 08:42  ? ?CT Angio Abd/Pel W and/or Wo Contrast ? ?Result  Date: 11/11/2021 ?CLINICAL DATA:  61 year old male with abdominal pain, possible mesenteric ischemia EXAM: CTA ABDOMEN AND PELVIS WITHOUT AND WITH CONTRAST TECHNIQUE: Multidetector CT imaging of the abdomen and pelvis was performed using the standard protocol during bolus administration of intravenous contrast. Multiplanar reconstructed images and MIPs were obtained and reviewed to evaluate the vascular anatomy. RADIATION DOSE REDUCTION: This exam was performed according to the departmental dose-optimization program which includes automated exposure control, adjustment of the mA and/or kV according to patient size and/or use of iterative reconstruction technique. CONTRAST:  179m OMNIPAQUE IOHEXOL 350 MG/ML SOLN COMPARISON:  None. FINDINGS: VASCULAR Aorta: Unremarkable course, caliber, contour of the abdominal aorta. No dissection, aneurysm, or periaortic fluid. Mild  atherosclerotic changes. Celiac: Patent, with no significant atherosclerotic changes. SMA: Patent, with minimal atherosclerotic changes. Renals: - Right: Right renal artery patent. - Left: Left renal artery patent. IMA: Inferior mesenteric artery is patent. Right lower extremity: Mild atherosclerotic changes of the right iliac system. Ectasias of the common iliac artery measuring 15 mm. No aneurysm, dissection, or occlusion. Hypogastric artery is patent. Common femoral artery patent. Proximal SFA and profunda femoris patent. Left lower extremity: Mild atherosclerosis of the left iliac system. No aneurysm, dissection, or occlusion. Hypogastric artery is patent. Common femoral artery patent. Proximal SFA and profunda femoris patent. Veins: Unremarkable appearance of the venous system. Review of the MIP images confirms the above findings. NON-VASCULAR Lower chest: Respiratory motion limits evaluation of the lung bases. Hepatobiliary: Diffusely decreased attenuation/enhancement of liver parenchyma. Uniformly enhancing lesion within segment 6 of the liver,  approximately 2.2 cm. No comparison available, and the lesion not identified on recent ultrasound. Unremarkable gallbladder. Pancreas: Unremarkable. Spleen: Unremarkable. Adrenals/Urinary Tract: - Right adrenal gland: Unrem

## 2021-11-11 NOTE — Discharge Instructions (Signed)
Your CT scan shows an abnormal spot on your liver that is probably a hemangioma but you will need an MRI to confirm this.  Your primary care physician can set this up. ? ?Your appendix is also abnormal, you are instructed to follow-up with a general surgeon as above.  ? ?If you develop worsening, continued, or recurrent abdominal pain, uncontrolled vomiting, fever, chest or back pain, or any other new/concerning symptoms then return to the ER for evaluation.  ?
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
## 2021-11-11 NOTE — ED Triage Notes (Signed)
Patient BIB [facility] from home with complaint of abdominal pain and yellow-colored loose bowel movements since 0100 this morning. Denies shortness of breath, is alert, oriented, and in no apparent distress at this time. Received '4mg'$  zofran PTA. 20g saline lock in left AC. ? ?EMS Vitals ?BP 165/110 ?HR 80 ?CBG 118 ?SpO2 98% on room air ?

## 2021-11-12 ENCOUNTER — Encounter: Payer: BC Managed Care – PPO | Admitting: Family Medicine

## 2021-11-14 ENCOUNTER — Other Ambulatory Visit: Payer: Self-pay

## 2021-11-14 ENCOUNTER — Inpatient Hospital Stay (HOSPITAL_COMMUNITY)
Admission: EM | Admit: 2021-11-14 | Discharge: 2021-11-25 | DRG: 330 | Disposition: A | Discharge status: Home / Self Care | Payer: BC Managed Care – PPO | Attending: General Surgery | Admitting: General Surgery

## 2021-11-14 ENCOUNTER — Encounter (HOSPITAL_COMMUNITY): Payer: Self-pay | Admitting: Pharmacy Technician

## 2021-11-14 ENCOUNTER — Emergency Department (HOSPITAL_COMMUNITY): Payer: BC Managed Care – PPO

## 2021-11-14 DIAGNOSIS — I2510 Atherosclerotic heart disease of native coronary artery without angina pectoris: Secondary | ICD-10-CM | POA: Diagnosis present

## 2021-11-14 DIAGNOSIS — Z20822 Contact with and (suspected) exposure to covid-19: Secondary | ICD-10-CM | POA: Diagnosis present

## 2021-11-14 DIAGNOSIS — K9189 Other postprocedural complications and disorders of digestive system: Secondary | ICD-10-CM | POA: Diagnosis not present

## 2021-11-14 DIAGNOSIS — Z452 Encounter for adjustment and management of vascular access device: Secondary | ICD-10-CM | POA: Diagnosis not present

## 2021-11-14 DIAGNOSIS — C772 Secondary and unspecified malignant neoplasm of intra-abdominal lymph nodes: Secondary | ICD-10-CM | POA: Diagnosis present

## 2021-11-14 DIAGNOSIS — K3580 Unspecified acute appendicitis: Secondary | ICD-10-CM | POA: Diagnosis not present

## 2021-11-14 DIAGNOSIS — Z87891 Personal history of nicotine dependence: Secondary | ICD-10-CM

## 2021-11-14 DIAGNOSIS — Z7902 Long term (current) use of antithrombotics/antiplatelets: Secondary | ICD-10-CM

## 2021-11-14 DIAGNOSIS — K388 Other specified diseases of appendix: Secondary | ICD-10-CM | POA: Diagnosis present

## 2021-11-14 DIAGNOSIS — Z6830 Body mass index (BMI) 30.0-30.9, adult: Secondary | ICD-10-CM

## 2021-11-14 DIAGNOSIS — K6389 Other specified diseases of intestine: Secondary | ICD-10-CM | POA: Diagnosis not present

## 2021-11-14 DIAGNOSIS — E669 Obesity, unspecified: Secondary | ICD-10-CM | POA: Diagnosis not present

## 2021-11-14 DIAGNOSIS — R1031 Right lower quadrant pain: Secondary | ICD-10-CM | POA: Diagnosis not present

## 2021-11-14 DIAGNOSIS — K56699 Other intestinal obstruction unspecified as to partial versus complete obstruction: Secondary | ICD-10-CM | POA: Diagnosis not present

## 2021-11-14 DIAGNOSIS — Z7982 Long term (current) use of aspirin: Secondary | ICD-10-CM | POA: Diagnosis not present

## 2021-11-14 DIAGNOSIS — Z9889 Other specified postprocedural states: Secondary | ICD-10-CM | POA: Diagnosis not present

## 2021-11-14 DIAGNOSIS — C181 Malignant neoplasm of appendix: Secondary | ICD-10-CM | POA: Diagnosis present

## 2021-11-14 DIAGNOSIS — I10 Essential (primary) hypertension: Secondary | ICD-10-CM | POA: Diagnosis not present

## 2021-11-14 DIAGNOSIS — Z5331 Laparoscopic surgical procedure converted to open procedure: Secondary | ICD-10-CM | POA: Diagnosis not present

## 2021-11-14 DIAGNOSIS — K353 Acute appendicitis with localized peritonitis, without perforation or gangrene: Secondary | ICD-10-CM | POA: Diagnosis not present

## 2021-11-14 DIAGNOSIS — K567 Ileus, unspecified: Secondary | ICD-10-CM | POA: Diagnosis not present

## 2021-11-14 DIAGNOSIS — I700 Atherosclerosis of aorta: Secondary | ICD-10-CM | POA: Diagnosis not present

## 2021-11-14 DIAGNOSIS — Z4682 Encounter for fitting and adjustment of non-vascular catheter: Secondary | ICD-10-CM | POA: Diagnosis not present

## 2021-11-14 DIAGNOSIS — Z823 Family history of stroke: Secondary | ICD-10-CM

## 2021-11-14 DIAGNOSIS — K37 Unspecified appendicitis: Secondary | ICD-10-CM | POA: Diagnosis not present

## 2021-11-14 DIAGNOSIS — C189 Malignant neoplasm of colon, unspecified: Secondary | ICD-10-CM | POA: Diagnosis not present

## 2021-11-14 DIAGNOSIS — J439 Emphysema, unspecified: Secondary | ICD-10-CM | POA: Diagnosis not present

## 2021-11-14 DIAGNOSIS — K219 Gastro-esophageal reflux disease without esophagitis: Secondary | ICD-10-CM | POA: Diagnosis not present

## 2021-11-14 DIAGNOSIS — Z8673 Personal history of transient ischemic attack (TIA), and cerebral infarction without residual deficits: Secondary | ICD-10-CM | POA: Diagnosis not present

## 2021-11-14 DIAGNOSIS — K660 Peritoneal adhesions (postprocedural) (postinfection): Secondary | ICD-10-CM | POA: Diagnosis present

## 2021-11-14 DIAGNOSIS — G4700 Insomnia, unspecified: Secondary | ICD-10-CM | POA: Diagnosis present

## 2021-11-14 DIAGNOSIS — R238 Other skin changes: Secondary | ICD-10-CM | POA: Diagnosis not present

## 2021-11-14 DIAGNOSIS — Z85819 Personal history of malignant neoplasm of unspecified site of lip, oral cavity, and pharynx: Secondary | ICD-10-CM | POA: Diagnosis not present

## 2021-11-14 DIAGNOSIS — Z808 Family history of malignant neoplasm of other organs or systems: Secondary | ICD-10-CM | POA: Diagnosis not present

## 2021-11-14 DIAGNOSIS — Z8249 Family history of ischemic heart disease and other diseases of the circulatory system: Secondary | ICD-10-CM

## 2021-11-14 DIAGNOSIS — K5939 Other megacolon: Secondary | ICD-10-CM | POA: Diagnosis not present

## 2021-11-14 DIAGNOSIS — F419 Anxiety disorder, unspecified: Secondary | ICD-10-CM | POA: Diagnosis not present

## 2021-11-14 DIAGNOSIS — Z96653 Presence of artificial knee joint, bilateral: Secondary | ICD-10-CM | POA: Diagnosis not present

## 2021-11-14 DIAGNOSIS — E039 Hypothyroidism, unspecified: Secondary | ICD-10-CM | POA: Diagnosis present

## 2021-11-14 DIAGNOSIS — J9811 Atelectasis: Secondary | ICD-10-CM | POA: Diagnosis not present

## 2021-11-14 DIAGNOSIS — Z8719 Personal history of other diseases of the digestive system: Secondary | ICD-10-CM | POA: Diagnosis not present

## 2021-11-14 LAB — COMPREHENSIVE METABOLIC PANEL
ALT: 37 U/L (ref 0–44)
AST: 45 U/L — ABNORMAL HIGH (ref 15–41)
Albumin: 4.3 g/dL (ref 3.5–5.0)
Alkaline Phosphatase: 75 U/L (ref 38–126)
Anion gap: 13 (ref 5–15)
BUN: 15 mg/dL (ref 8–23)
CO2: 21 mmol/L — ABNORMAL LOW (ref 22–32)
Calcium: 10 mg/dL (ref 8.9–10.3)
Chloride: 98 mmol/L (ref 98–111)
Creatinine, Ser: 0.94 mg/dL (ref 0.61–1.24)
GFR, Estimated: >60 mL/min (ref >60)
Glucose, Bld: 125 mg/dL — ABNORMAL HIGH (ref 70–99)
Potassium: 3.8 mmol/L (ref 3.5–5.1)
Sodium: 132 mmol/L — ABNORMAL LOW (ref 135–145)
Total Bilirubin: 0.8 mg/dL (ref 0.3–1.2)
Total Protein: 7.8 g/dL (ref 6.5–8.1)

## 2021-11-14 LAB — CBC
HCT: 45.7 % (ref 39.0–52.0)
Hemoglobin: 16.2 g/dL (ref 13.0–17.0)
MCH: 32.3 pg (ref 26.0–34.0)
MCHC: 35.4 g/dL (ref 30.0–36.0)
MCV: 91 fL (ref 80.0–100.0)
Platelets: 278 10*3/uL (ref 150–400)
RBC: 5.02 MIL/uL (ref 4.22–5.81)
RDW: 11.7 % (ref 11.5–15.5)
WBC: 14.9 10*3/uL — ABNORMAL HIGH (ref 4.0–10.5)
nRBC: 0 % (ref 0.0–0.2)

## 2021-11-14 LAB — LIPASE, BLOOD: Lipase: 26 U/L (ref 11–51)

## 2021-11-14 MED ORDER — ONDANSETRON HCL 4 MG/2ML IJ SOLN
4.0000 mg | Freq: Once | INTRAMUSCULAR | Status: AC
  Administered 2021-11-15: 4 mg via INTRAVENOUS
  Filled 2021-11-14: qty 2

## 2021-11-14 MED ORDER — HYDROCODONE-ACETAMINOPHEN 5-325 MG PO TABS
1.0000 | ORAL_TABLET | Freq: Once | ORAL | Status: AC
  Administered 2021-11-14: 1 via ORAL
  Filled 2021-11-14: qty 1

## 2021-11-14 MED ORDER — METRONIDAZOLE 500 MG/100ML IV SOLN
500.0000 mg | Freq: Once | INTRAVENOUS | Status: AC
  Administered 2021-11-15: 500 mg via INTRAVENOUS
  Filled 2021-11-14: qty 100

## 2021-11-14 MED ORDER — MORPHINE SULFATE (PF) 4 MG/ML IV SOLN
4.0000 mg | Freq: Once | INTRAVENOUS | Status: AC
  Administered 2021-11-15: 4 mg via INTRAVENOUS
  Filled 2021-11-14: qty 1

## 2021-11-14 MED ORDER — IOHEXOL 300 MG/ML  SOLN
100.0000 mL | Freq: Once | INTRAMUSCULAR | Status: AC | PRN
  Administered 2021-11-14: 100 mL via INTRAVENOUS

## 2021-11-14 MED ORDER — CEFTRIAXONE SODIUM 2 G IJ SOLR
2.0000 g | Freq: Once | INTRAMUSCULAR | Status: AC
  Administered 2021-11-15: 2 g via INTRAVENOUS
  Filled 2021-11-14: qty 20

## 2021-11-14 MED ORDER — LACTATED RINGERS IV SOLN
INTRAVENOUS | Status: DC
  Administered 2021-11-15: 1000 mL via INTRAVENOUS

## 2021-11-14 NOTE — ED Notes (Addendum)
Pt stated that pain is 9 out of 10. Informed Triage PA. ?

## 2021-11-14 NOTE — ED Provider Notes (Signed)
? ?Emergency Department Provider Note ? ? ?I have reviewed the triage vital signs and the nursing notes. ? ? ?HISTORY ? ?Chief Complaint ?Abdominal Pain ? ? ?HPI ?Patrick Bailey. is a 61 y.o. male with PMH of CVA and HTN presents to the ED for evaluation of worsening RLQ abdominal pain. Patient was seen in the ED on 4/11 with abdominal pain. Appendix abnormal but after consultation patient arranged to have close follow up with surgery.  Patient tells me that pain had been around his umbilicus but now is localized in the right lower quadrant.  Pain is become severe.  No fevers.  Nausea but no vomiting. He is on ASA and Plavix with stroke history.  ? ? ?Past Medical History:  ?Diagnosis Date  ? Abnormality of gait 07/17/2016  ? Alcohol use   ? Arthritis   ? Cerebellar stroke ([place]) 07/17/2016  ? Presumed, causing gait abnormality s/p eval by neuro [name]) 07/2016 overall improving.   ? Coronary artery disease   ? CPDD (calcium pyrophosphate deposition disease) 10/2013  ? R hand xray, L knee xray  ? GERD (gastroesophageal reflux disease)   ? Gout   ? History of smoking   ? HTN (hypertension)   ? Hypertension   ? Hypothyroidism (acquired)   ? Insomnia   ? Nondisplaced fracture of distal phalanx of right thumb, initial encounter for closed fracture 10/09/2016  ? Throat cancer ([place]) 11 years ago   ? Dr. Caryl Pina ([facility])/ 33 radiation treatments/ 2 shots chemo  ? TOBACCO ABUSE, HX OF 02/05/2009  ? Quit 2011    ? ? ?Review of Systems ? ?Constitutional: No fever/chills ?Eyes: No visual changes. ?ENT: No sore throat. ?Cardiovascular: Denies chest pain. ?Respiratory: Denies shortness of breath. ?Gastrointestinal: Positive abdominal pain.  Positive nausea, no vomiting.  No diarrhea.  No constipation. ?Genitourinary: Negative for dysuria. ?Musculoskeletal: Negative for back pain. ?Skin: Negative for rash. ?Neurological: Negative for headaches, focal weakness or  numbness. ? ? ?____________________________________________ ? ? ?PHYSICAL EXAM: ? ?VITAL SIGNS: ?ED Triage Vitals  ?Enc Vitals Group  ?   BP 11/14/21 1853 (!) 142/88  ?   Pulse Rate 11/14/21 1853 (!) 125  ?   Resp 11/14/21 1853 18  ?   Temp 11/14/21 1853 98.1 ?F (36.7 ?C)  ?   Temp Source 11/14/21 1853 Oral  ?   SpO2 11/14/21 1853 96 %  ? ?Constitutional: Alert and oriented. Patient appears uncomfortable.  ?Eyes: Conjunctivae are normal. ?Head: Atraumatic. ?Nose: No congestion/rhinnorhea. ?Mouth/Throat: Mucous membranes are moist.   ?Neck: No stridor.   ?Cardiovascular: Normal rate, regular rhythm. Good peripheral circulation. Grossly normal heart sounds.   ?Respiratory: Normal respiratory effort.  No retractions. Lungs CTAB. ?Gastrointestinal: Soft with focal tenderness in the RLQ. No rebound. No distention.  ?Musculoskeletal: No lower extremity tenderness nor edema. No gross deformities of extremities. ?Neurologic:  Normal speech and language. No gross focal neurologic deficits are appreciated.  ?Skin:  Skin is warm, dry and intact. No rash noted. ? ?____________________________________________ ?  ?LABS ?(all labs ordered are listed, but only abnormal results are displayed) ? ?Labs Reviewed  ?COMPREHENSIVE METABOLIC PANEL - Abnormal; Notable for the following components:  ?    Result Value  ? Sodium 132 (*)   ? CO2 21 (*)   ? Glucose, Bld 125 (*)   ? AST 45 (*)   ? All other components within normal limits  ?CBC - Abnormal; Notable for the following components:  ? WBC 14.9 (*)   ?  All other components within normal limits  ?RESP PANEL BY RT-PCR (FLU A&B, COVID) ARPGX2  ?LIPASE, BLOOD  ?URINALYSIS, ROUTINE W REFLEX MICROSCOPIC  ? ?____________________________________________ ? ?RADIOLOGY ? ?CT ABDOMEN PELVIS W CONTRAST ? ?Addendum Date: 11/14/2021   ?ADDENDUM REPORT: 11/14/2021 21:09 ADDENDUM: Critical findings were reported to [place] at 9:07 p.m. Electronically Signed   By: Brett Fairy M.D.   On:  11/14/2021 21:09  ? ?Result Date: 11/14/2021 ?CLINICAL DATA:  Right lower quadrant abdominal pain. EXAM: CT ABDOMEN AND PELVIS WITH CONTRAST TECHNIQUE: Multidetector CT imaging of the abdomen and pelvis was performed using the standard protocol following bolus administration of intravenous contrast. RADIATION DOSE REDUCTION: This exam was performed according to the departmental dose-optimization program which includes automated exposure control, adjustment of the mA and/or kV according to patient size and/or use of iterative reconstruction technique. CONTRAST:  170m OMNIPAQUE IOHEXOL 300 MG/ML  SOLN COMPARISON:  11/11/2021. FINDINGS: Lower chest: Coronary artery calcifications are noted. Atelectasis is noted at the lung bases. A 7 mm nodular opacity is present in the lingular segment of the left upper lobe which is new from the prior exam from 3 days ago and likely represents focal atelectasis. Hepatobiliary: There is redemonstration of an enhancing lesion in the posterior right lobe of the liver measuring 1.8 cm. Fatty infiltration of the liver is noted. No gallstones, gallbladder wall thickening, or biliary dilatation. Pancreas: Unremarkable. No pancreatic ductal dilatation or surrounding inflammatory changes. Spleen: Normal in size without focal abnormality. Adrenals/Urinary Tract: Adrenal glands are unremarkable. Kidneys are normal, without renal calculi, focal lesion, or hydronephrosis. Bladder is unremarkable. Stomach/Bowel: There is redemonstration of distended fluid-filled appendix with irregular wall thickening. Surrounding inflammatory changes are noted with a small amount of free fluid in the pelvis, suggesting acute appendicitis. The stomach is within normal limits. Colonic diverticulosis without diverticulitis. Vascular/Lymphatic: Aortic atherosclerosis. No enlarged abdominal or pelvic lymph nodes. Reproductive: Prostate is unremarkable. Other: Small fat containing umbilical hernia. Fat containing  inguinal hernias are noted bilaterally. Musculoskeletal: Degenerative changes are present in the thoracolumbar spine. No acute osseous abnormality. IMPRESSION: 1. Distended fluid-filled appendix with irregular wall thickening and new surrounding fat stranding and a small amount of free fluid, suspicious for acute appendicitis. Surgical consultation is recommended. 2. Indeterminate enhancing lesion in the posterior right lobe of the liver. MRI is suggested for further characterization. Electronically Signed: By: LBrett FairyM.D. On: 11/14/2021 21:05   ? ?____________________________________________ ? ? ?PROCEDURES ? ?Procedure(s) performed:  ? ?Procedures ? ?CRITICAL CARE ?Performed by: JMargette Fast?Total critical care time: 35 minutes ?Critical care time was exclusive of separately billable procedures and treating other patients. ?Critical care was necessary to treat or prevent imminent or life-threatening deterioration. ?Critical care was time spent personally by me on the following activities: development of treatment plan with patient and/or surrogate as well as nursing, discussions with consultants, evaluation of patient's response to treatment, examination of patient, obtaining history from patient or surrogate, ordering and performing treatments and interventions, ordering and review of laboratory studies, ordering and review of radiographic studies, pulse oximetry and re-evaluation of patient's condition. ? ?JNanda Quinton MD ?Emergency Medicine ? ?____________________________________________ ? ? ?INITIAL IMPRESSION / ASSESSMENT AND PLAN / ED COURSE ? ?Pertinent labs & imaging results that were available during my care of the patient were reviewed by me and considered in my medical decision making (see chart for details). ?  ?This patient is Presenting for Evaluation of abdominal pain, which does require a range of treatment options,  and is a complaint that involves a high risk of morbidity and  mortality. ? ?The Differential Diagnoses includes but is not exclusive to acute appendicitis, renal colic, testicular torsion, urinary tract infection, prostatitis,  diverticulitis, small bowel obstruction, colitis, abdominal aortic aneurysm, gastroenteritis, constipation etc. ? ? ?Critical Inte

## 2021-11-14 NOTE — ED Provider Triage Note (Signed)
Emergency Medicine Provider Triage Evaluation Note ? ?[name] , a 61 y.o. male  was evaluated in triage.  Pt complains of abdominal pain.  Patient was seen at this facility on 4/11 for similar symptoms.  He had a CT scan which showed a mucocele of his appendix, but there is no urgent need for surgery.  He states that he is "having pain in his appendix".  States he had a fever at home, but has not checked his temperature.  He vomited earlier due to the pain. Has not taken anything for pain.  Has not followed up with the surgeon, has an appointment scheduled with Dr. Zenia Resides with general surgery on 4/18.   ? ?Review of Systems  ?Positive: Abdominal pain, nausea, vomiting, diarrhea ?Negative: Urinary sx ? ?Physical Exam  ?BP (!) 142/88   Pulse (!) 125   Temp 98.1 ?F (36.7 ?C) (Oral)   Resp 18   SpO2 96%  ?Gen:   Awake, no distress   ?Resp:  Normal effort  ?MSK:   Moves extremities without difficulty  ?Other:   ? ?Medical Decision Making  ?Medically screening exam initiated at 6:55 PM.  Appropriate orders placed.  [name]. was informed that the remainder of the evaluation will be completed by another provider, this initial triage assessment does not replace that evaluation, and the importance of remaining in the ED until their evaluation is complete. ? ? ?  ?Kateri Plummer, PA-C ?11/14/21 1855 ? ?

## 2021-11-14 NOTE — ED Triage Notes (Signed)
Pt here with reports of continued abdominal pain. Pt tachycardic in triage. Has not used anything OTC for pain.  ?

## 2021-11-15 ENCOUNTER — Other Ambulatory Visit: Payer: Self-pay

## 2021-11-15 ENCOUNTER — Encounter (HOSPITAL_COMMUNITY): Payer: Self-pay | Admitting: General Surgery

## 2021-11-15 ENCOUNTER — Observation Stay (HOSPITAL_COMMUNITY): Payer: BC Managed Care – PPO | Admitting: Anesthesiology

## 2021-11-15 ENCOUNTER — Encounter (HOSPITAL_COMMUNITY): Admission: EM | Disposition: A | Discharge status: Home / Self Care | Payer: Self-pay | Source: Home / Self Care

## 2021-11-15 DIAGNOSIS — K388 Other specified diseases of appendix: Secondary | ICD-10-CM | POA: Diagnosis not present

## 2021-11-15 DIAGNOSIS — F419 Anxiety disorder, unspecified: Secondary | ICD-10-CM | POA: Diagnosis not present

## 2021-11-15 DIAGNOSIS — K37 Unspecified appendicitis: Secondary | ICD-10-CM | POA: Diagnosis not present

## 2021-11-15 DIAGNOSIS — I2510 Atherosclerotic heart disease of native coronary artery without angina pectoris: Secondary | ICD-10-CM | POA: Diagnosis not present

## 2021-11-15 DIAGNOSIS — I10 Essential (primary) hypertension: Secondary | ICD-10-CM | POA: Diagnosis not present

## 2021-11-15 DIAGNOSIS — C181 Malignant neoplasm of appendix: Secondary | ICD-10-CM | POA: Diagnosis not present

## 2021-11-15 DIAGNOSIS — R1031 Right lower quadrant pain: Secondary | ICD-10-CM | POA: Diagnosis not present

## 2021-11-15 HISTORY — PX: LAPAROTOMY: SHX154

## 2021-11-15 HISTORY — PX: LAPAROSCOPIC APPENDECTOMY: SHX408

## 2021-11-15 LAB — CBC
HCT: 46.6 % (ref 39.0–52.0)
Hemoglobin: 16.4 g/dL (ref 13.0–17.0)
MCH: 33.1 pg (ref 26.0–34.0)
MCHC: 35.2 g/dL (ref 30.0–36.0)
MCV: 94.1 fL (ref 80.0–100.0)
Platelets: 220 10*3/uL (ref 150–400)
RBC: 4.95 MIL/uL (ref 4.22–5.81)
RDW: 12 % (ref 11.5–15.5)
WBC: 13.9 10*3/uL — ABNORMAL HIGH (ref 4.0–10.5)
nRBC: 0 % (ref 0.0–0.2)

## 2021-11-15 LAB — BASIC METABOLIC PANEL
Anion gap: 10 (ref 5–15)
BUN: 11 mg/dL (ref 8–23)
CO2: 23 mmol/L (ref 22–32)
Calcium: 9.4 mg/dL (ref 8.9–10.3)
Chloride: 98 mmol/L (ref 98–111)
Creatinine, Ser: 0.99 mg/dL (ref 0.61–1.24)
GFR, Estimated: >60 mL/min (ref >60)
Glucose, Bld: 112 mg/dL — ABNORMAL HIGH (ref 70–99)
Potassium: 4.4 mmol/L (ref 3.5–5.1)
Sodium: 131 mmol/L — ABNORMAL LOW (ref 135–145)

## 2021-11-15 LAB — URINALYSIS, ROUTINE W REFLEX MICROSCOPIC
Bilirubin Urine: NEGATIVE
Glucose, UA: NEGATIVE mg/dL
Hgb urine dipstick: NEGATIVE
Ketones, ur: NEGATIVE mg/dL
Leukocytes,Ua: NEGATIVE
Nitrite: NEGATIVE
Protein, ur: NEGATIVE mg/dL
Specific Gravity, Urine: >1.046 — ABNORMAL HIGH (ref 1.005–1.030)
pH: 6 (ref 5.0–8.0)

## 2021-11-15 LAB — RESP PANEL BY RT-PCR (FLU A&B, COVID) ARPGX2
Influenza A by PCR: NEGATIVE
Influenza B by PCR: NEGATIVE
SARS Coronavirus 2 by RT PCR: NEGATIVE

## 2021-11-15 LAB — HIV ANTIBODY (ROUTINE TESTING W REFLEX): HIV Screen 4th Generation wRfx: NONREACTIVE

## 2021-11-15 SURGERY — APPENDECTOMY, LAPAROSCOPIC
Anesthesia: General

## 2021-11-15 MED ORDER — FENTANYL CITRATE (PF) 250 MCG/5ML IJ SOLN
INTRAMUSCULAR | Status: AC
  Filled 2021-11-15: qty 5

## 2021-11-15 MED ORDER — BUPIVACAINE-EPINEPHRINE 0.25% -1:200000 IJ SOLN
INTRAMUSCULAR | Status: DC | PRN
  Administered 2021-11-15: 5 mL
  Administered 2021-11-15: 7 mL

## 2021-11-15 MED ORDER — 0.9 % SODIUM CHLORIDE (POUR BTL) OPTIME
TOPICAL | Status: DC | PRN
  Administered 2021-11-15: 2000 mL

## 2021-11-15 MED ORDER — HYDROCODONE-ACETAMINOPHEN 5-325 MG PO TABS
1.0000 | ORAL_TABLET | Freq: Four times a day (QID) | ORAL | Status: DC | PRN
Start: 2021-11-15 — End: 2021-11-15
  Administered 2021-11-15: 1 via ORAL
  Filled 2021-11-15: qty 1

## 2021-11-15 MED ORDER — DEXAMETHASONE SODIUM PHOSPHATE 10 MG/ML IJ SOLN
INTRAMUSCULAR | Status: DC | PRN
  Administered 2021-11-15: 10 mg via INTRAVENOUS

## 2021-11-15 MED ORDER — CHLORHEXIDINE GLUCONATE 0.12 % MT SOLN
OROMUCOSAL | Status: AC
  Administered 2021-11-15: 15 mL via OROMUCOSAL
  Filled 2021-11-15: qty 15

## 2021-11-15 MED ORDER — GABAPENTIN 400 MG PO CAPS
800.0000 mg | ORAL_CAPSULE | Freq: Every day | ORAL | Status: DC
  Administered 2021-11-15 – 2021-11-24 (×11): 800 mg via ORAL
  Filled 2021-11-15 (×12): qty 2

## 2021-11-15 MED ORDER — ONDANSETRON HCL 4 MG/2ML IJ SOLN
4.0000 mg | Freq: Four times a day (QID) | INTRAMUSCULAR | Status: DC | PRN
  Administered 2021-11-15 – 2021-11-21 (×4): 4 mg via INTRAVENOUS
  Filled 2021-11-15 (×5): qty 2

## 2021-11-15 MED ORDER — GABAPENTIN 400 MG PO CAPS
400.0000 mg | ORAL_CAPSULE | Freq: Two times a day (BID) | ORAL | Status: DC
Start: 2021-11-15 — End: 2021-11-25
  Administered 2021-11-15 – 2021-11-25 (×19): 400 mg via ORAL
  Filled 2021-11-15 (×19): qty 1

## 2021-11-15 MED ORDER — GABAPENTIN 300 MG PO CAPS: 400.0000 mg | ORAL_CAPSULE | Freq: Every day | ORAL | Status: DC

## 2021-11-15 MED ORDER — LIDOCAINE 2% (20 MG/ML) 5 ML SYRINGE
INTRAMUSCULAR | Status: AC
  Filled 2021-11-15: qty 5

## 2021-11-15 MED ORDER — BUPIVACAINE-EPINEPHRINE (PF) 0.25% -1:200000 IJ SOLN
INTRAMUSCULAR | Status: AC
  Filled 2021-11-15: qty 30

## 2021-11-15 MED ORDER — ONDANSETRON HCL 4 MG/2ML IJ SOLN
INTRAMUSCULAR | Status: AC
  Filled 2021-11-15: qty 2

## 2021-11-15 MED ORDER — CHLORHEXIDINE GLUCONATE 0.12 % MT SOLN: 15.0000 mL | Freq: Once | OROMUCOSAL | Status: AC

## 2021-11-15 MED ORDER — OXYCODONE HCL 5 MG PO TABS: 5.0000 mg | ORAL_TABLET | Freq: Once | ORAL | Status: DC | PRN

## 2021-11-15 MED ORDER — ROCURONIUM BROMIDE 10 MG/ML (PF) SYRINGE
PREFILLED_SYRINGE | INTRAVENOUS | Status: DC | PRN
  Administered 2021-11-15: 10 mg via INTRAVENOUS
  Administered 2021-11-15: 40 mg via INTRAVENOUS
  Administered 2021-11-15: 20 mg via INTRAVENOUS
  Administered 2021-11-15: 30 mg via INTRAVENOUS

## 2021-11-15 MED ORDER — PROPOFOL 10 MG/ML IV BOLUS
INTRAVENOUS | Status: DC | PRN
  Administered 2021-11-15: 200 mg via INTRAVENOUS

## 2021-11-15 MED ORDER — FENTANYL CITRATE (PF) 100 MCG/2ML IJ SOLN: 25.0000 ug | INTRAMUSCULAR | Status: DC | PRN

## 2021-11-15 MED ORDER — SUCCINYLCHOLINE CHLORIDE 200 MG/10ML IV SOSY
PREFILLED_SYRINGE | INTRAVENOUS | Status: AC
  Filled 2021-11-15: qty 10

## 2021-11-15 MED ORDER — AMITRIPTYLINE HCL 50 MG PO TABS
50.0000 mg | ORAL_TABLET | Freq: Every day | ORAL | Status: DC
  Administered 2021-11-15 – 2021-11-24 (×11): 50 mg via ORAL
  Filled 2021-11-15 (×11): qty 1

## 2021-11-15 MED ORDER — MORPHINE SULFATE (PF) 4 MG/ML IV SOLN
4.0000 mg | INTRAVENOUS | Status: DC | PRN
  Administered 2021-11-15 – 2021-11-21 (×39): 4 mg via INTRAVENOUS
  Filled 2021-11-15 (×40): qty 1

## 2021-11-15 MED ORDER — PROPOFOL 10 MG/ML IV BOLUS
INTRAVENOUS | Status: AC
Start: 2021-11-15 — End: ?
  Filled 2021-11-15: qty 20

## 2021-11-15 MED ORDER — SUGAMMADEX SODIUM 200 MG/2ML IV SOLN
INTRAVENOUS | Status: DC | PRN
  Administered 2021-11-15: 200 mg via INTRAVENOUS

## 2021-11-15 MED ORDER — POTASSIUM CHLORIDE IN NACL 20-0.9 MEQ/L-% IV SOLN
INTRAVENOUS | Status: AC
  Filled 2021-11-15 (×17): qty 1000

## 2021-11-15 MED ORDER — METOPROLOL TARTRATE 100 MG PO TABS
100.0000 mg | ORAL_TABLET | Freq: Two times a day (BID) | ORAL | Status: DC
  Administered 2021-11-15 – 2021-11-16 (×4): 100 mg via ORAL
  Filled 2021-11-15: qty 4
  Filled 2021-11-15 (×3): qty 1

## 2021-11-15 MED ORDER — ATORVASTATIN CALCIUM 40 MG PO TABS
40.0000 mg | ORAL_TABLET | Freq: Every day | ORAL | Status: DC
  Administered 2021-11-16 – 2021-11-17 (×2): 40 mg via ORAL
  Filled 2021-11-15 (×2): qty 1

## 2021-11-15 MED ORDER — LACTATED RINGERS IV SOLN: INTRAVENOUS | Status: DC

## 2021-11-15 MED ORDER — PHENYLEPHRINE 40 MCG/ML (10ML) SYRINGE FOR IV PUSH (FOR BLOOD PRESSURE SUPPORT)
PREFILLED_SYRINGE | INTRAVENOUS | Status: DC | PRN
  Administered 2021-11-15: 120 ug via INTRAVENOUS

## 2021-11-15 MED ORDER — SUCCINYLCHOLINE CHLORIDE 200 MG/10ML IV SOSY
PREFILLED_SYRINGE | INTRAVENOUS | Status: DC | PRN
  Administered 2021-11-15: 120 mg via INTRAVENOUS

## 2021-11-15 MED ORDER — MIDAZOLAM HCL 5 MG/5ML IJ SOLN
INTRAMUSCULAR | Status: DC | PRN
  Administered 2021-11-15: 2 mg via INTRAVENOUS

## 2021-11-15 MED ORDER — FENTANYL CITRATE (PF) 250 MCG/5ML IJ SOLN
INTRAMUSCULAR | Status: DC | PRN
  Administered 2021-11-15 (×6): 50 ug via INTRAVENOUS

## 2021-11-15 MED ORDER — OXYCODONE HCL 5 MG/5ML PO SOLN: 5.0000 mg | Freq: Once | ORAL | Status: DC | PRN

## 2021-11-15 MED ORDER — SODIUM CHLORIDE 0.9 % IR SOLN
Status: DC | PRN
  Administered 2021-11-15: 1000 mL

## 2021-11-15 MED ORDER — ROCURONIUM BROMIDE 10 MG/ML (PF) SYRINGE
PREFILLED_SYRINGE | INTRAVENOUS | Status: AC
  Filled 2021-11-15: qty 10

## 2021-11-15 MED ORDER — FENOFIBRATE 160 MG PO TABS
160.0000 mg | ORAL_TABLET | Freq: Every day | ORAL | Status: DC
  Administered 2021-11-15 – 2021-11-17 (×3): 160 mg via ORAL
  Filled 2021-11-15 (×4): qty 1

## 2021-11-15 MED ORDER — LIDOCAINE 2% (20 MG/ML) 5 ML SYRINGE
INTRAMUSCULAR | Status: DC | PRN
  Administered 2021-11-15: 60 mg via INTRAVENOUS

## 2021-11-15 MED ORDER — DEXAMETHASONE SODIUM PHOSPHATE 10 MG/ML IJ SOLN
INTRAMUSCULAR | Status: AC
  Filled 2021-11-15: qty 1

## 2021-11-15 MED ORDER — PHENYLEPHRINE HCL-NACL 20-0.9 MG/250ML-% IV SOLN
INTRAVENOUS | Status: DC | PRN
  Administered 2021-11-15: 40 ug/min via INTRAVENOUS

## 2021-11-15 MED ORDER — OXYCODONE HCL 5 MG PO TABS
5.0000 mg | ORAL_TABLET | ORAL | Status: DC | PRN
  Administered 2021-11-15 – 2021-11-16 (×3): 10 mg via ORAL
  Filled 2021-11-15 (×3): qty 2

## 2021-11-15 MED ORDER — AMLODIPINE BESYLATE 10 MG PO TABS
10.0000 mg | ORAL_TABLET | Freq: Every day | ORAL | Status: DC
  Administered 2021-11-15 – 2021-11-25 (×10): 10 mg via ORAL
  Filled 2021-11-15 (×10): qty 1

## 2021-11-15 MED ORDER — LEVOTHYROXINE SODIUM 75 MCG PO TABS: 150.0000 ug | ORAL_TABLET | Freq: Every day | ORAL | Status: DC

## 2021-11-15 MED ORDER — MIDAZOLAM HCL 2 MG/2ML IJ SOLN
INTRAMUSCULAR | Status: AC
  Filled 2021-11-15: qty 2

## 2021-11-15 MED ORDER — ONDANSETRON 4 MG PO TBDP: 4.0000 mg | ORAL_TABLET | Freq: Four times a day (QID) | ORAL | Status: DC | PRN

## 2021-11-15 MED ORDER — ORAL CARE MOUTH RINSE: 15.0000 mL | Freq: Once | OROMUCOSAL | Status: AC

## 2021-11-15 MED ORDER — LEVOTHYROXINE SODIUM 75 MCG PO TABS
150.0000 ug | ORAL_TABLET | Freq: Every day | ORAL | Status: DC
  Administered 2021-11-15 – 2021-11-17 (×3): 150 ug via ORAL
  Filled 2021-11-15 (×4): qty 2

## 2021-11-15 MED ORDER — ONDANSETRON HCL 4 MG/2ML IJ SOLN: 4.0000 mg | Freq: Once | INTRAMUSCULAR | Status: DC | PRN

## 2021-11-15 SURGICAL SUPPLY — 62 items
APPLIER CLIP 5 13 M/L LIGAMAX5 (MISCELLANEOUS)
BAG COUNTER SPONGE SURGICOUNT (BAG) ×2 IMPLANT
BAG SPEC RTRVL LRG 6X4 10 (ENDOMECHANICALS) ×1
BLADE CLIPPER SURG (BLADE) ×1 IMPLANT
CANISTER SUCT 3000ML PPV (MISCELLANEOUS) ×2 IMPLANT
CHLORAPREP W/TINT 26 (MISCELLANEOUS) ×2 IMPLANT
CLIP APPLIE 5 13 M/L LIGAMAX5 (MISCELLANEOUS) IMPLANT
COVER SURGICAL LIGHT HANDLE (MISCELLANEOUS) ×2 IMPLANT
CUTTER FLEX LINEAR 45M (STAPLE) IMPLANT
DERMABOND ADVANCED (GAUZE/BANDAGES/DRESSINGS) ×1
DERMABOND ADVANCED .7 DNX12 (GAUZE/BANDAGES/DRESSINGS) ×1 IMPLANT
ELECT CAUTERY BLADE 6.4 (BLADE) ×1 IMPLANT
ELECT REM PT RETURN 9FT ADLT (ELECTROSURGICAL) ×2
ELECTRODE REM PT RTRN 9FT ADLT (ELECTROSURGICAL) ×1 IMPLANT
ENDOLOOP SUT PDS II  0 18 (SUTURE)
ENDOLOOP SUT PDS II 0 18 (SUTURE) IMPLANT
GLOVE BIOGEL PI IND STRL 6.5 (GLOVE) IMPLANT
GLOVE BIOGEL PI IND STRL 7.0 (GLOVE) IMPLANT
GLOVE BIOGEL PI INDICATOR 6.5 (GLOVE) ×3
GLOVE BIOGEL PI INDICATOR 7.0 (GLOVE) ×3
GLOVE BIOGEL PI MICRO 5.5 (GLOVE) ×1
GLOVE BIOGEL PI MICRO STRL 5.5 (GLOVE) ×1 IMPLANT
GLOVE BIOGEL PI ORTHO PRO SZ8 (GLOVE) ×2
GLOVE PI ORTHO PRO STRL SZ8 (GLOVE) IMPLANT
GLOVE SURG UNDER POLY LF SZ6 (GLOVE) ×2 IMPLANT
GOWN STRL REUS W/ TWL LRG LVL3 (GOWN DISPOSABLE) ×3 IMPLANT
GOWN STRL REUS W/TWL LRG LVL3 (GOWN DISPOSABLE) ×6
HANDLE SUCTION POOLE (INSTRUMENTS) IMPLANT
KIT BASIN OR (CUSTOM PROCEDURE TRAY) ×2 IMPLANT
KIT TURNOVER KIT B (KITS) ×2 IMPLANT
NS IRRIG 1000ML POUR BTL (IV SOLUTION) ×2 IMPLANT
PAD ARMBOARD 7.5X6 YLW CONV (MISCELLANEOUS) ×4 IMPLANT
PENCIL BUTTON HOLSTER BLD 10FT (ELECTRODE) ×2 IMPLANT
POUCH SPECIMEN RETRIEVAL 10MM (ENDOMECHANICALS) ×2 IMPLANT
RELOAD PROXIMATE 75MM BLUE (ENDOMECHANICALS) ×4 IMPLANT
RELOAD STAPLE 45 3.5 BLU ETS (ENDOMECHANICALS) IMPLANT
RELOAD STAPLE 75 3.8 BLU REG (ENDOMECHANICALS) IMPLANT
RELOAD STAPLE TA45 3.5 REG BLU (ENDOMECHANICALS) IMPLANT
SCISSORS LAP 5X35 DISP (ENDOMECHANICALS) IMPLANT
SET IRRIG TUBING LAPAROSCOPIC (IRRIGATION / IRRIGATOR) IMPLANT
SET TUBE SMOKE EVAC HIGH FLOW (TUBING) ×2 IMPLANT
SHEARS HARMONIC ACE PLUS 36CM (ENDOMECHANICALS) ×1 IMPLANT
SLEEVE ENDOPATH XCEL 5M (ENDOMECHANICALS) ×4 IMPLANT
SPECIMEN JAR SMALL (MISCELLANEOUS) ×2 IMPLANT
STAPLER GUN LINEAR PROX 60 (STAPLE) ×1 IMPLANT
STAPLER PROXIMATE 75MM BLUE (STAPLE) ×1 IMPLANT
SUCTION POOLE HANDLE (INSTRUMENTS) ×2
SUT MNCRL AB 4-0 PS2 18 (SUTURE) ×2 IMPLANT
SUT PDS AB 1 TP1 96 (SUTURE) ×2 IMPLANT
SUT SILK 2 0 (SUTURE) ×2
SUT SILK 2 0 SH (SUTURE) ×1 IMPLANT
SUT SILK 2-0 18XBRD TIE 12 (SUTURE) IMPLANT
SUT SILK 3 0 SH CR/8 (SUTURE) ×2 IMPLANT
TOWEL GREEN STERILE (TOWEL DISPOSABLE) ×2 IMPLANT
TOWEL GREEN STERILE FF (TOWEL DISPOSABLE) ×2 IMPLANT
TRAY FOLEY W/BAG SLVR 14FR (SET/KITS/TRAYS/PACK) ×1 IMPLANT
TRAY LAPAROSCOPIC MC (CUSTOM PROCEDURE TRAY) ×2 IMPLANT
TROCAR XCEL BLUNT TIP 100MML (ENDOMECHANICALS) ×2 IMPLANT
TROCAR XCEL NON-BLD 5MMX100MML (ENDOMECHANICALS) ×2 IMPLANT
TUBE CONNECTING 12X1/4 (SUCTIONS) ×1 IMPLANT
WATER STERILE IRR 1000ML POUR (IV SOLUTION) ×2 IMPLANT
YANKAUER SUCT BULB TIP NO VENT (SUCTIONS) ×2 IMPLANT

## 2021-11-15 NOTE — Transfer of Care (Signed)
Immediate Anesthesia Transfer of Care Note ? ?Patient: Patrick Bailey. ? ?Procedure(s) Performed: APPENDECTOMY LAPAROSCOPIC ?CONVERTED TO LAPAROTOMY WITH PARTIAL COLECTOMY ? ?Patient Location: PACU ? ?Anesthesia Type:General ? ?Level of Consciousness: drowsy ? ?Airway & Oxygen Therapy: Patient Spontanous Breathing and Patient connected to face mask oxygen ? ?Post-op Assessment: Report given to RN and Post -op Vital signs reviewed and stable ? ?Post vital signs: Reviewed and stable ? ?Last Vitals:  ?Vitals Value Taken Time  ?BP 153/96 11/15/21 1208  ?Temp    ?Pulse 109 11/15/21 1209  ?Resp 15 11/15/21 1209  ?SpO2 97 % 11/15/21 1209  ?Vitals shown include unvalidated device data. ? ?Last Pain:  ?Vitals:  ? 11/15/21 0920  ?TempSrc:   ?PainSc: 9   ?   ? ?Patients Stated Pain Goal: 3 (11/15/21 0920) ? ?Complications: No notable events documented. ?

## 2021-11-15 NOTE — Op Note (Signed)
Date: 11/15/21 ? ?Patient: Patrick Bailey. ?MRN: [id number] ? ?Preoperative Diagnosis: Acute appendicitis ?Postoperative Diagnosis: Appendiceal mass ? ?Procedure: Laparoscopic converted to open ileocecectomy with ileocolic anastomosis ? ?Surgeon: Michaelle Birks, MD ?Assistant: Armandina Gemma, MD ? ?EBL: 30 mL ? ?Anesthesia: General endotracheal ? ?Specimens: terminal ileum, appendix, and right colon ? ?Indications: Patrick Bailey is a 60 yo male who presented to the ED several days ago with abdominal pain, and was found to have what appeared to be a possible mucocele in the appendix without signs of acute appendicitis. His pain resolved in the ED and outpatient colonoscopy and surgery follow up were scheduled. He returned to the ED last night with recurrent RLQ pain and was found to have a leukocytosis and new stranding around the appendix, consistent with acute appendicitis. He was brought to the OR today for appendectomy. ? ?Findings: Firm mass-like lesion in the appendix with extension onto the cecum, highly suspicious for malignancy. No evidence of metastatic disease within the abdomen. ? ?Procedure details: Informed consent was obtained in the preoperative area prior to the procedure. The patient was brought to the operating room and placed on the table in the supine position. General anesthesia was induced and appropriate lines and drains were placed for intraoperative monitoring. Perioperative antibiotics were administered per SCIP guidelines. The abdomen was prepped and draped in the usual sterile fashion. A pre-procedure timeout was taken verifying patient identity, surgical site and procedure to be performed. ? ?A small infraumbilical skin incision was made, the subcutaneous tissue was spread, and the umbilical stalk was grasped and elevated.  The fascia was incised, the peritoneal cavity was directly visualized, and a 12 mm Hassan trocar was placed.  The abdomen was inspected with no evidence of visceral or  vascular injury.  Additional 5 mm ports were placed in the suprapubic area and in the left lower quadrant, both under direct visualization.  The cecum and appendix were identified in the right lower quadrant.  The appendix was very thickened with a firm, mass-like lesion at the base of the appendix.  There was no evidence of abscess or perforation.  The appendix was grasped and elevated.  The mesoappendix was adherent to the abdominal sidewall, and was taken off the sidewall using gentle blunt dissection and harmonic shears.  The majority of the appendix felt very firm, suspicious for a malignancy.  The mass appeared to extend into the base of the appendix onto the cecum, and there was clearly no margin of normal healthy tissue between the base of the appendix and the cecum.  Due to this and the high suspicion for malignancy, the decision was made to proceed with a right colectomy.  An additional 5 mm port was placed in the right lower quadrant under direct visualization.  The right colon was mobilized in a lateral to medial fashion along the line of Toldt using blunt dissection and harmonic shears.  The mesentery of the terminal ileum was adherent to the pelvic sidewall via dense inflammatory adhesions.  We attempted to divide these adhesions using harmonic shears, but there was a lot of oozing and visualization was difficult.  At this point, we elected to convert to an open procedure, as visualization was difficult and there was significant oozing as the patient was on Plavix prior to surgery. ? ?The laparoscopic ports were removed and the abdomen was desufflated.  The umbilical incision was extended superiorly and inferiorly and the subcutaneous tissue was divided with cautery to expose the fascia.  The  fascia was opened along the linea alba.  A Balfour retractor was placed.  The adhesions between the small bowel mesentery and the right pelvic sidewall were divided using blunt dissection and cautery.  This  allowed the cecum and terminal ileum to be brought up and exteriorized through the incision.  The appendiceal mass felt very firm and extended into the cecum. The hepatic flexure of the colon was then mobilized using cautery and harmonic shears.  A mesenteric window was created adjacent to the terminal ileum approximately 15 cm proximal to the ileocecal valve, and the ileum was divided at this point using a 75 mm GIA stapler with a blue load.  A mesenteric window was then created on the colon at hepatic flexure, proximal to the middle colic vessels, and the colon was divided with a 75 mm GIA stapler with a blue load.  The intervening mesentery was divided with harmonic shears. When the ileocolic pedicle was encountered, a high ligation was performed using 2-0 silk suture. The specimen was passed off the field and sent for routine pathology. Next the end of the ileum was placed adjacent to the end of the transverse colon and a 3-0 silk stay suture was placed. A side-to-side antiperistaltic ileocolic anastomosis was created between the antimesenteric surface of the small bowel and the taenia of the colon using a 87m GIA stapler with a blue load. The common enterotomy was closed with a TA60 blue stapler. There was some bleeding along this staple line, which was controlled by oversewing the staple line with interrupted 3-0 silk sutures. The mesenteric defect was closed with 3-0 silk figure-of-eight sutures. The abdomen was irrigated and appeared hemostatic. The anastomosis was palpated and was widely patent. Both lobes of the liver were palpated and there were no surface nodules or palpable mass lesions within the liver. There were no peritoneal nodules. The fascia was closed with a running looped 1 PDS suture and the skin at the midline incision was closed with staples. The 564mport sites were closed with subcuticular 4-0 monocryl suture and covered with dermabond. A sterile honeycomb dressing was applied to the  midline incision. ? ?The patient tolerated the procedure well with no apparent complications. All counts were correct x2 at the end of the procedure. The patient was extubated and taken to PACU in stable condition. ? ?ShMichaelle BirksMD ?11/15/21 ?11:55 AM ? ? ?

## 2021-11-15 NOTE — Anesthesia Preprocedure Evaluation (Addendum)
Anesthesia Evaluation  ?Patient identified by MRN, date of birth, ID band ?Patient awake ? ? ? ?Reviewed: ?Allergy & Precautions, NPO status , Patient's Chart, lab work & pertinent test results, reviewed documented beta blocker date and time  ? ?History of Anesthesia Complications ?Negative for: history of anesthetic complications ? ?Airway ?Mallampati: III ? ?TM Distance: >3 FB ?Neck ROM: Full ? ? ? Dental ? ?(+) Dental Advisory Given, Edentulous Upper ?  ?Pulmonary ?former smoker,  ?  ?Pulmonary exam normal ? ? ? ? ? ? ? Cardiovascular ?hypertension, Pt. on medications and Pt. on home beta blockers ?+ CAD  ?Normal cardiovascular exam ? ? ?'21 TTE - EF 70 to 75%. Grade III diastolic dysfunction (restrictive). ? ?  ?Neuro/Psych ?PSYCHIATRIC DISORDERS Anxiety CVA, No Residual Symptoms   ? GI/Hepatic ?GERD  Medicated and Controlled,(+)  ?  ? substance abuse ? alcohol use and marijuana use,   ?Endo/Other  ?Hypothyroidism  ?Obesity ?Na 131 ? ? Renal/GU ?negative Renal ROS  ? ?  ?Musculoskeletal ? ?(+) Arthritis ,  ?Gout ?  ? Abdominal ?  ?Peds ? Hematology ? ?On plavix ?   ?Anesthesia Other Findings ?Hx throat cancer s/p chemotherapy ?HSV ? Reproductive/Obstetrics ? ?  ? ? ? ? ? ? ? ? ? ? ? ? ? ?  ?  ? ? ? ? ? ? ? ?Anesthesia Physical ?Anesthesia Plan ? ?ASA: 3 ? ?Anesthesia Plan: General  ? ?Post-op Pain Management:   ? ?Induction: Intravenous and Rapid sequence ? ?PONV Risk Score and Plan: 3 and Treatment may vary due to age or medical condition, Ondansetron, Dexamethasone and Midazolam ? ?Airway Management Planned: Oral ETT ? ?Additional Equipment: None ? ?Intra-op Plan:  ? ?Post-operative Plan: Extubation in OR ? ?Informed Consent: I have reviewed the patients History and Physical, chart, labs and discussed the procedure including the risks, benefits and alternatives for the proposed anesthesia with the patient or authorized representative who has indicated his/her understanding and  acceptance.  ? ? ? ?Dental advisory given ? ?Plan Discussed with: CRNA and Anesthesiologist ? ?Anesthesia Plan Comments:   ? ? ? ? ? ?Anesthesia Quick Evaluation ? ?

## 2021-11-15 NOTE — ED Notes (Signed)
All belongings transported w/ pt to short stay ?

## 2021-11-15 NOTE — ED Notes (Signed)
The pt is c/o being seen here several days previously  he was told that he has appendicitis and is getting surgery in the am  he is c/o pain med given and he wants something to drink  water.  He was told that he is npo after midnight and cannot have any water  he then went into the br and drank from the faucet.  He reports that he can do whatever he wants ?

## 2021-11-15 NOTE — Anesthesia Postprocedure Evaluation (Signed)
Anesthesia Post Note ? ?Patient: Patrick Bailey. ? ?Procedure(s) Performed: APPENDECTOMY LAPAROSCOPIC ?CONVERTED TO LAPAROTOMY WITH PARTIAL COLECTOMY ? ?  ? ?Patient location during evaluation: PACU ?Anesthesia Type: General ?Level of consciousness: awake and alert ?Pain management: pain level controlled ?Vital Signs Assessment: post-procedure vital signs reviewed and stable ?Respiratory status: spontaneous breathing, nonlabored ventilation and respiratory function stable ?Cardiovascular status: stable and blood pressure returned to baseline ?Anesthetic complications: no ? ? ?No notable events documented. ? ?Last Vitals:  ?Vitals:  ? 11/15/21 1307 11/15/21 1335  ?BP: (!) 150/106 (!) 157/99  ?Pulse: (!) 119 (!) 118  ?Resp: 17 15  ?Temp: 37 ?C 36.4 ?C  ?SpO2: 94% 94%  ?  ?Last Pain:  ?Vitals:  ? 11/15/21 1335  ?TempSrc: Oral  ?PainSc:   ? ? ?  ?  ?  ?  ?  ?  ? ?[name] ? ? ? ? ?
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
## 2021-11-15 NOTE — Interval H&P Note (Signed)
History and Physical Interval Note: ? ?11/15/2021 ?9:33 AM ? ?Patrick Bailey.  has presented today for surgery, with the diagnosis of Appendicitis.  The various methods of treatment have been discussed with the patient and family. After consideration of risks, benefits and other options for treatment, the patient has consented to  Procedure(s): ?APPENDECTOMY LAPAROSCOPIC (N/A) as a surgical intervention.  The patient's history has been reviewed, patient examined, no change in status, stable for surgery.  I have reviewed the patient's chart and labs.  Questions were answered to the patient's satisfaction.  Patient still having RLQ pain, CT reviewed. Discussed the details of laparoscopic appendectomy, and the small possibility of right colectomy if there is concern for malignancy given possible mucocele on previous imaging. Patient expressed understanding and agrees to proceed. ? ? ?Dwan Bolt ? ? ?

## 2021-11-15 NOTE — Anesthesia Procedure Notes (Signed)
Procedure Name: Intubation ?Date/Time: 11/15/2021 9:57 AM ?Performed by: Kyung Rudd, CRNA ?Pre-anesthesia Checklist: Patient identified, Emergency Drugs available, Suction available and Patient being monitored ?Patient Re-evaluated:Patient Re-evaluated prior to induction ?Oxygen Delivery Method: Circle system utilized ?Preoxygenation: Pre-oxygenation with 100% oxygen ?Induction Type: IV induction and Rapid sequence ?Laryngoscope Size: Mac and 4 ?Grade View: Grade II ?Tube type: Oral ?Tube size: 7.5 mm ?Number of attempts: 1 ?Airway Equipment and Method: Stylet ?Placement Confirmation: ETT inserted through vocal cords under direct vision, positive ETCO2 and breath sounds checked- equal and bilateral ?Secured at: 21 cm ?Tube secured with: Tape ?Dental Injury: Teeth and Oropharynx as per pre-operative assessment  ? ? ? ? ?

## 2021-11-15 NOTE — Addendum Note (Signed)
Addendum  created 11/15/21 1433 by Kyung Rudd, CRNA  ? Flowsheet accepted  ?  ?

## 2021-11-15 NOTE — ED Notes (Signed)
Pt reports a provider did not go over the surgery with him. Consent filled out, but not signed. Will transport it with pt.  ?

## 2021-11-15 NOTE — ED Notes (Signed)
Pt undressed and ready for OR ?

## 2021-11-16 ENCOUNTER — Observation Stay (HOSPITAL_COMMUNITY): Payer: BC Managed Care – PPO

## 2021-11-16 ENCOUNTER — Encounter (HOSPITAL_COMMUNITY): Payer: Self-pay | Admitting: Surgery

## 2021-11-16 ENCOUNTER — Inpatient Hospital Stay (HOSPITAL_COMMUNITY): Payer: BC Managed Care – PPO

## 2021-11-16 DIAGNOSIS — K219 Gastro-esophageal reflux disease without esophagitis: Secondary | ICD-10-CM | POA: Diagnosis present

## 2021-11-16 DIAGNOSIS — Z20822 Contact with and (suspected) exposure to covid-19: Secondary | ICD-10-CM | POA: Diagnosis present

## 2021-11-16 DIAGNOSIS — E669 Obesity, unspecified: Secondary | ICD-10-CM | POA: Diagnosis present

## 2021-11-16 DIAGNOSIS — I2510 Atherosclerotic heart disease of native coronary artery without angina pectoris: Secondary | ICD-10-CM | POA: Diagnosis present

## 2021-11-16 DIAGNOSIS — Z5331 Laparoscopic surgical procedure converted to open procedure: Secondary | ICD-10-CM | POA: Diagnosis not present

## 2021-11-16 DIAGNOSIS — C772 Secondary and unspecified malignant neoplasm of intra-abdominal lymph nodes: Secondary | ICD-10-CM | POA: Diagnosis present

## 2021-11-16 DIAGNOSIS — Z8249 Family history of ischemic heart disease and other diseases of the circulatory system: Secondary | ICD-10-CM | POA: Diagnosis not present

## 2021-11-16 DIAGNOSIS — K388 Other specified diseases of appendix: Secondary | ICD-10-CM | POA: Diagnosis present

## 2021-11-16 DIAGNOSIS — Z808 Family history of malignant neoplasm of other organs or systems: Secondary | ICD-10-CM | POA: Diagnosis not present

## 2021-11-16 DIAGNOSIS — Z7982 Long term (current) use of aspirin: Secondary | ICD-10-CM | POA: Diagnosis not present

## 2021-11-16 DIAGNOSIS — E039 Hypothyroidism, unspecified: Secondary | ICD-10-CM | POA: Diagnosis present

## 2021-11-16 DIAGNOSIS — K660 Peritoneal adhesions (postprocedural) (postinfection): Secondary | ICD-10-CM | POA: Diagnosis present

## 2021-11-16 DIAGNOSIS — Z87891 Personal history of nicotine dependence: Secondary | ICD-10-CM | POA: Diagnosis not present

## 2021-11-16 DIAGNOSIS — Z7902 Long term (current) use of antithrombotics/antiplatelets: Secondary | ICD-10-CM | POA: Diagnosis not present

## 2021-11-16 DIAGNOSIS — Z96653 Presence of artificial knee joint, bilateral: Secondary | ICD-10-CM | POA: Diagnosis present

## 2021-11-16 DIAGNOSIS — G4700 Insomnia, unspecified: Secondary | ICD-10-CM | POA: Diagnosis present

## 2021-11-16 DIAGNOSIS — K353 Acute appendicitis with localized peritonitis, without perforation or gangrene: Secondary | ICD-10-CM | POA: Diagnosis present

## 2021-11-16 DIAGNOSIS — K567 Ileus, unspecified: Secondary | ICD-10-CM | POA: Diagnosis not present

## 2021-11-16 DIAGNOSIS — K5939 Other megacolon: Secondary | ICD-10-CM | POA: Diagnosis not present

## 2021-11-16 DIAGNOSIS — Z85819 Personal history of malignant neoplasm of unspecified site of lip, oral cavity, and pharynx: Secondary | ICD-10-CM | POA: Diagnosis not present

## 2021-11-16 DIAGNOSIS — Z823 Family history of stroke: Secondary | ICD-10-CM | POA: Diagnosis not present

## 2021-11-16 DIAGNOSIS — K37 Unspecified appendicitis: Secondary | ICD-10-CM | POA: Diagnosis present

## 2021-11-16 DIAGNOSIS — C181 Malignant neoplasm of appendix: Secondary | ICD-10-CM | POA: Diagnosis present

## 2021-11-16 DIAGNOSIS — K6389 Other specified diseases of intestine: Secondary | ICD-10-CM | POA: Diagnosis not present

## 2021-11-16 DIAGNOSIS — I10 Essential (primary) hypertension: Secondary | ICD-10-CM | POA: Diagnosis present

## 2021-11-16 DIAGNOSIS — K9189 Other postprocedural complications and disorders of digestive system: Secondary | ICD-10-CM | POA: Diagnosis not present

## 2021-11-16 DIAGNOSIS — Z8673 Personal history of transient ischemic attack (TIA), and cerebral infarction without residual deficits: Secondary | ICD-10-CM | POA: Diagnosis not present

## 2021-11-16 LAB — CBC
HCT: 40.5 % (ref 39.0–52.0)
Hemoglobin: 13.9 g/dL (ref 13.0–17.0)
MCH: 32.3 pg (ref 26.0–34.0)
MCHC: 34.3 g/dL (ref 30.0–36.0)
MCV: 94 fL (ref 80.0–100.0)
Platelets: 223 10*3/uL (ref 150–400)
RBC: 4.31 MIL/uL (ref 4.22–5.81)
RDW: 11.9 % (ref 11.5–15.5)
WBC: 10.2 10*3/uL (ref 4.0–10.5)
nRBC: 0 % (ref 0.0–0.2)

## 2021-11-16 LAB — BASIC METABOLIC PANEL
Anion gap: 8 (ref 5–15)
BUN: 10 mg/dL (ref 8–23)
CO2: 28 mmol/L (ref 22–32)
Calcium: 9.2 mg/dL (ref 8.9–10.3)
Chloride: 97 mmol/L — ABNORMAL LOW (ref 98–111)
Creatinine, Ser: 1.13 mg/dL (ref 0.61–1.24)
GFR, Estimated: >60 mL/min (ref >60)
Glucose, Bld: 124 mg/dL — ABNORMAL HIGH (ref 70–99)
Potassium: 4.6 mmol/L (ref 3.5–5.1)
Sodium: 133 mmol/L — ABNORMAL LOW (ref 135–145)

## 2021-11-16 MED ORDER — TAMSULOSIN HCL 0.4 MG PO CAPS
0.4000 mg | ORAL_CAPSULE | Freq: Every day | ORAL | Status: DC
  Administered 2021-11-16 – 2021-11-25 (×9): 0.4 mg via ORAL
  Filled 2021-11-16 (×9): qty 1

## 2021-11-16 MED ORDER — PANTOPRAZOLE SODIUM 20 MG PO TBEC
20.0000 mg | DELAYED_RELEASE_TABLET | Freq: Every day | ORAL | Status: DC
  Administered 2021-11-16: 20 mg via ORAL
  Filled 2021-11-16: qty 1

## 2021-11-16 MED ORDER — METOPROLOL TARTRATE 5 MG/5ML IV SOLN
5.0000 mg | Freq: Four times a day (QID) | INTRAVENOUS | Status: DC
  Administered 2021-11-16 – 2021-11-19 (×10): 5 mg via INTRAVENOUS
  Filled 2021-11-16 (×10): qty 5

## 2021-11-16 MED ORDER — CHLORHEXIDINE GLUCONATE CLOTH 2 % EX PADS
6.0000 | MEDICATED_PAD | Freq: Every day | CUTANEOUS | Status: DC
  Administered 2021-11-17 – 2021-11-25 (×7): 6 via TOPICAL

## 2021-11-16 MED ORDER — PANTOPRAZOLE SODIUM 40 MG IV SOLR
40.0000 mg | Freq: Every day | INTRAVENOUS | Status: DC
  Administered 2021-11-17 – 2021-11-25 (×9): 40 mg via INTRAVENOUS
  Filled 2021-11-16 (×9): qty 10

## 2021-11-16 MED ORDER — ENOXAPARIN SODIUM 40 MG/0.4ML IJ SOSY
40.0000 mg | PREFILLED_SYRINGE | INTRAMUSCULAR | Status: DC
  Administered 2021-11-16 – 2021-11-24 (×9): 40 mg via SUBCUTANEOUS
  Filled 2021-11-16 (×9): qty 0.4

## 2021-11-16 NOTE — Progress Notes (Signed)
? ? ?1 Day Post-Op  ?Subjective: ?Patient reports bloating and discomfort this morning. Some nausea but no vomiting. ? ? ?Objective: ?Vital signs in last 24 hours: ?Temp:  [97.6 ?F (36.4 ?C)-98.6 ?F (37 ?C)] 97.8 ?F (36.6 ?C) (04/16 6606) ?Pulse Rate:  [97-132] 97 (04/16 0605) ?Resp:  [15-17] 17 (04/16 3016) ?BP: (118-157)/(76-106) 120/82 (04/16 0109) ?SpO2:  [91 %-97 %] 92 % (04/16 0605) ?  ? ?Intake/Output from previous day: ?04/15 0701 - 04/16 0700 ?In: 2800 [I.V.:2800] ?Out: 1200 [Urine:1100; Blood:100] ?Intake/Output this shift: ?Total I/O ?In: -  ?Out: 3235 [TDDUK:0254] ? ?PE: ?General: resting comfortably, NAD ?Neuro: alert and oriented, no focal deficits ?Resp: normal work of breathing on room air ?Abdomen: soft, mildly distended, appropriately tender to palpation. Honeycomb dressing in place over midline with shadowing on inferior aspect. ?Extremities: warm and well-perfused ?GU: foley in place draining clear yellow urine ? ? ?Lab Results:  ?Recent Labs  ?  11/15/21 ?0600 11/16/21 ?2706  ?WBC 13.9* 10.2  ?HGB 16.4 13.9  ?HCT 46.6 40.5  ?PLT 220 223  ? ?BMET ?Recent Labs  ?  11/15/21 ?0600 11/16/21 ?2376  ?NA 131* 133*  ?K 4.4 4.6  ?CL 98 97*  ?CO2 23 28  ?GLUCOSE 112* 124*  ?BUN 11 10  ?CREATININE 0.99 1.13  ?CALCIUM 9.4 9.2  ? ?PT/INR ?No results for input(s): LABPROT, INR in the last 72 hours. ?CMP  ?   ?Component Value Date/Time  ? NA 133 (L) 11/16/2021 0531  ? NA 137 03/11/2014 1025  ? K 4.6 11/16/2021 0531  ? K 4.0 03/11/2014 1025  ? CL 97 (L) 11/16/2021 0531  ? CL 104 03/11/2014 1025  ? CO2 28 11/16/2021 0531  ? CO2 25 03/11/2014 1025  ? GLUCOSE 124 (H) 11/16/2021 0531  ? GLUCOSE 132 (H) 03/11/2014 1025  ? BUN 10 11/16/2021 0531  ? BUN 15 03/11/2014 1025  ? CREATININE 1.13 11/16/2021 0531  ? CREATININE 1.06 12/01/2019 1627  ? CALCIUM 9.2 11/16/2021 0531  ? CALCIUM 8.6 03/11/2014 1025  ? PROT 7.8 11/14/2021 1845  ? PROT 7.1 02/23/2018 1627  ? ALBUMIN 4.3 11/14/2021 1845  ? AST 45 (H) 11/14/2021 1845   ? ALT 37 11/14/2021 1845  ? ALKPHOS 75 11/14/2021 1845  ? BILITOT 0.8 11/14/2021 1845  ? GFRNONAA >60 11/16/2021 0531  ? GFRNONAA >60 03/11/2014 1025  ? GFRAA >60 01/11/2020 1609  ? GFRAA >60 03/11/2014 1025  ? ?Lipase  ?   ?Component Value Date/Time  ? LIPASE 26 11/14/2021 1845  ? ? ? ? ? ?Studies/Results: ?DG Abd 1 View ? ?Result Date: 11/16/2021 ?CLINICAL DATA:  Abdominal distention, recent abdominal surgery EXAM: ABDOMEN - 1 VIEW COMPARISON:  CT done on 11/14/2021 FINDINGS: There is moderate dilation of small-bowel loops measuring up to 5.2 cm in diameter. Gas and stool are noted in colon. Skin staples are seen in the lower abdomen. No abnormal masses or calcifications are seen. Kidneys are partly obscured by bowel contents. IMPRESSION: There is moderate dilation of small-bowel loops suggesting ileus or partial small bowel obstruction. Electronically Signed   By: Elmer Picker M.D.   On: 11/16/2021 12:13  ? ?CT ABDOMEN PELVIS W CONTRAST ? ?Addendum Date: 11/14/2021   ?ADDENDUM REPORT: 11/14/2021 21:09 ADDENDUM: Critical findings were reported to [place] at 9:07 p.m. Electronically Signed   By: Brett Fairy M.D.   On: 11/14/2021 21:09  ? ?Result Date: 11/14/2021 ?CLINICAL DATA:  Right lower quadrant abdominal pain. EXAM: CT ABDOMEN AND PELVIS WITH  CONTRAST TECHNIQUE: Multidetector CT imaging of the abdomen and pelvis was performed using the standard protocol following bolus administration of intravenous contrast. RADIATION DOSE REDUCTION: This exam was performed according to the departmental dose-optimization program which includes automated exposure control, adjustment of the mA and/or kV according to patient size and/or use of iterative reconstruction technique. CONTRAST:  125m OMNIPAQUE IOHEXOL 300 MG/ML  SOLN COMPARISON:  11/11/2021. FINDINGS: Lower chest: Coronary artery calcifications are noted. Atelectasis is noted at the lung bases. A 7 mm nodular opacity is present in the lingular segment  of the left upper lobe which is new from the prior exam from 3 days ago and likely represents focal atelectasis. Hepatobiliary: There is redemonstration of an enhancing lesion in the posterior right lobe of the liver measuring 1.8 cm. Fatty infiltration of the liver is noted. No gallstones, gallbladder wall thickening, or biliary dilatation. Pancreas: Unremarkable. No pancreatic ductal dilatation or surrounding inflammatory changes. Spleen: Normal in size without focal abnormality. Adrenals/Urinary Tract: Adrenal glands are unremarkable. Kidneys are normal, without renal calculi, focal lesion, or hydronephrosis. Bladder is unremarkable. Stomach/Bowel: There is redemonstration of distended fluid-filled appendix with irregular wall thickening. Surrounding inflammatory changes are noted with a small amount of free fluid in the pelvis, suggesting acute appendicitis. The stomach is within normal limits. Colonic diverticulosis without diverticulitis. Vascular/Lymphatic: Aortic atherosclerosis. No enlarged abdominal or pelvic lymph nodes. Reproductive: Prostate is unremarkable. Other: Small fat containing umbilical hernia. Fat containing inguinal hernias are noted bilaterally. Musculoskeletal: Degenerative changes are present in the thoracolumbar spine. No acute osseous abnormality. IMPRESSION: 1. Distended fluid-filled appendix with irregular wall thickening and new surrounding fat stranding and a small amount of free fluid, suspicious for acute appendicitis. Surgical consultation is recommended. 2. Indeterminate enhancing lesion in the posterior right lobe of the liver. MRI is suggested for further characterization. Electronically Signed: By: LBrett FairyM.D. On: 11/14/2021 21:05   ? ?Anti-infectives: ?Anti-infectives (From admission, onward)  ? ? Start     Dose/Rate Route Frequency Ordered Stop  ? 11/14/21 2330  cefTRIAXone (ROCEPHIN) 2 g in sodium chloride 0.9 % 100 mL IVPB       ?See Hyperspace for full Linked  Orders Report.  ? 2 g ?200 mL/hr over 30 Minutes Intravenous  Once 11/14/21 2328 11/15/21 0112  ? 11/14/21 2330  metroNIDAZOLE (FLAGYL) IVPB 500 mg       ?See Hyperspace for full Linked Orders Report.  ? 500 mg ?100 mL/hr over 60 Minutes Intravenous  Once 11/14/21 2328 11/15/21 0221  ? ?  ? ? ? ?Assessment/Plan ? 61yo male with appendiceal mass, POD1 s/p lap converted to open right hemicolectomy.  ?- Mild distension and nausea: Consistent with postop ileus. KUB this morning shows mild small bowel dilation. Make NPO except sips, if patient has worsening nausea or vomiting, will place NG tube. ?- Continue maintenance IV fluids ?- Patient reports difficulty voiding at home, cannot void unless standing. Keep foley for today and begin flomax, plan to remove tomorrow. ?- Home Plavix on hold, remaining home meds ordered as appropriate ?- Multimodal pain control ?- VTE: SCDs, begin prophylactic lovenox today ?- Dispo: med-surg floor. Surgical pathology pending. ? ? ? LOS: 0 days  ? ? ?SMichaelle Birks MD ?[facility] ?General, Hepatobiliary and Pancreatic Surgery ?11/16/21 12:20 PM ? ?

## 2021-11-16 NOTE — Plan of Care (Signed)

## 2021-11-17 ENCOUNTER — Inpatient Hospital Stay (HOSPITAL_COMMUNITY): Payer: BC Managed Care – PPO

## 2021-11-17 LAB — BASIC METABOLIC PANEL
Anion gap: 10 (ref 5–15)
BUN: 18 mg/dL (ref 8–23)
CO2: 23 mmol/L (ref 22–32)
Calcium: 9 mg/dL (ref 8.9–10.3)
Chloride: 103 mmol/L (ref 98–111)
Creatinine, Ser: 1 mg/dL (ref 0.61–1.24)
GFR, Estimated: >60 mL/min (ref >60)
Glucose, Bld: 120 mg/dL — ABNORMAL HIGH (ref 70–99)
Potassium: 4.1 mmol/L (ref 3.5–5.1)
Sodium: 136 mmol/L (ref 135–145)

## 2021-11-17 LAB — CBC
HCT: 41.1 % (ref 39.0–52.0)
HCT: 42.6 % (ref 39.0–52.0)
Hemoglobin: 14.3 g/dL (ref 13.0–17.0)
Hemoglobin: 14.4 g/dL (ref 13.0–17.0)
MCH: 32.9 pg (ref 26.0–34.0)
MCH: 32.4 pg (ref 26.0–34.0)
MCHC: 34.8 g/dL (ref 30.0–36.0)
MCHC: 33.8 g/dL (ref 30.0–36.0)
MCV: 94.5 fL (ref 80.0–100.0)
MCV: 95.7 fL (ref 80.0–100.0)
Platelets: 243 10*3/uL (ref 150–400)
Platelets: 290 10*3/uL (ref 150–400)
RBC: 4.35 MIL/uL (ref 4.22–5.81)
RBC: 4.45 MIL/uL (ref 4.22–5.81)
RDW: 12 % (ref 11.5–15.5)
RDW: 12.1 % (ref 11.5–15.5)
WBC: 8.9 10*3/uL (ref 4.0–10.5)
WBC: 8.2 10*3/uL (ref 4.0–10.5)
nRBC: 0 % (ref 0.0–0.2)
nRBC: 0 % (ref 0.0–0.2)

## 2021-11-17 MED ORDER — LIP MEDEX EX OINT
1.0000 | TOPICAL_OINTMENT | CUTANEOUS | Status: DC | PRN
Start: 2021-11-17 — End: 2021-11-25
  Filled 2021-11-17 (×2): qty 7

## 2021-11-17 MED ORDER — SODIUM CHLORIDE 0.9 % IV BOLUS
500.0000 mL | Freq: Once | INTRAVENOUS | Status: AC
  Administered 2021-11-17: 500 mL via INTRAVENOUS

## 2021-11-17 NOTE — Progress Notes (Signed)
2 Days Post-Op  ? ?Subjective/Chief Complaint: ?Ng in overnight, feels better, no stool/flatus ? ? ?Objective: ?Vital signs in last 24 hours: ?Temp:  [97.5 ?F (36.4 ?C)-98.3 ?F (36.8 ?C)] 98.1 ?F (36.7 ?C) (04/17 2831) ?Pulse Rate:  [104-120] 120 (04/17 0806) ?Resp:  [17-19] 19 (04/17 0524) ?BP: (125-137)/(84-93) 135/92 (04/17 0806) ?SpO2:  [92 %-94 %] 94 % (04/17 0806) ?Last BM Date :  (PTA) ? ?Intake/Output from previous day: ?04/16 0701 - 04/17 0700 ?In: 2370.2 [I.V.:2370.2] ?Out: 3450 [Urine:2150; Emesis/NG output:1300] ?Intake/Output this shift: ?Total I/O ?In: 99.5 [I.V.:99.5] ?Out: -  ? ?General: resting comfortably, NAD ?Neuro: alert and oriented ?Resp: normal work of breathing on room air ?Abdomen: soft,  distended, appropriately tender to palpation. Drainage in inferior aspect of incision ?Extremities: warm and well-perfused ? ?  ? ?Lab Results:  ?Recent Labs  ?  11/16/21 ?5176 11/17/21 ?0716  ?WBC 10.2 8.9  ?HGB 13.9 14.3  ?HCT 40.5 41.1  ?PLT 223 243  ? ?BMET ?Recent Labs  ?  11/16/21 ?1607 11/17/21 ?0716  ?NA 133* 136  ?K 4.6 4.1  ?CL 97* 103  ?CO2 28 23  ?GLUCOSE 124* 120*  ?BUN 10 18  ?CREATININE 1.13 1.00  ?CALCIUM 9.2 9.0  ? ?PT/INR ?No results for input(s): LABPROT, INR in the last 72 hours. ?ABG ?No results for input(s): PHART, HCO3 in the last 72 hours. ? ?Invalid input(s): PCO2, PO2 ? ?Studies/Results: ?DG Abd 1 View ? ?Result Date: 11/16/2021 ?CLINICAL DATA:  Abdominal distention, recent abdominal surgery EXAM: ABDOMEN - 1 VIEW COMPARISON:  CT done on 11/14/2021 FINDINGS: There is moderate dilation of small-bowel loops measuring up to 5.2 cm in diameter. Gas and stool are noted in colon. Skin staples are seen in the lower abdomen. No abnormal masses or calcifications are seen. Kidneys are partly obscured by bowel contents. IMPRESSION: There is moderate dilation of small-bowel loops suggesting ileus or partial small bowel obstruction. Electronically Signed   By: Elmer Picker M.D.   On:  11/16/2021 12:13  ? ?DG Abd Portable 1V ? ?Result Date: 11/16/2021 ?CLINICAL DATA:  Enteric catheter placement EXAM: PORTABLE ABDOMEN - 1 VIEW COMPARISON:  11/16/2021 FINDINGS: Frontal view of the lower chest and upper abdomen demonstrates enteric catheter coiled over the gastric fundus. Persistent small bowel distension, most consistent with ileus given recent laparotomy. Hypoventilatory changes are seen at the lung bases. IMPRESSION: 1. Enteric catheter coiled over the gastric fundus. 2. Persistent small bowel distension, favor postoperative ileus over obstruction. Electronically Signed   By: Randa Ngo M.D.   On: 11/16/2021 22:45   ? ?Anti-infectives: ?Anti-infectives (From admission, onward)  ? ? Start     Dose/Rate Route Frequency Ordered Stop  ? 11/14/21 2330  cefTRIAXone (ROCEPHIN) 2 g in sodium chloride 0.9 % 100 mL IVPB       ?See Hyperspace for full Linked Orders Report.  ? 2 g ?200 mL/hr over 30 Minutes Intravenous  Once 11/14/21 2328 11/15/21 0112  ? 11/14/21 2330  metroNIDAZOLE (FLAGYL) IVPB 500 mg       ?See Hyperspace for full Linked Orders Report.  ? 500 mg ?100 mL/hr over 60 Minutes Intravenous  Once 11/14/21 2328 11/15/21 0221  ? ?  ? ? ?Assessment/Plan: ?POD 2 s/p lap converted to open right hemicolectomy.  ?- Ng in overnight for ileus, remain in place, recheck kub in am ?-change dressing today ?- Continue maintenance IV fluids ?-remove foley catheter ?- Home Plavix on hold, will hold other po meds not necessary for  now also ?- Multimodal pain control ?- VTE: SCDs, lovenox ?- Dispo: med-surg floor. Surgical pathology pending. ?  ? ?Rolm Bookbinder ?11/17/2021 ? ?

## 2021-11-18 ENCOUNTER — Inpatient Hospital Stay (HOSPITAL_COMMUNITY): Payer: BC Managed Care – PPO

## 2021-11-18 MED ORDER — ADULT MULTIVITAMIN W/MINERALS CH
1.0000 | ORAL_TABLET | Freq: Every day | ORAL | Status: DC
  Administered 2021-11-18: 1 via ORAL
  Filled 2021-11-18: qty 1

## 2021-11-18 MED ORDER — LORAZEPAM 2 MG/ML IJ SOLN: 0.0000 mg | Freq: Four times a day (QID) | INTRAMUSCULAR | Status: AC

## 2021-11-18 MED ORDER — LORAZEPAM 2 MG/ML IJ SOLN: 0.0000 mg | Freq: Two times a day (BID) | INTRAMUSCULAR | Status: AC

## 2021-11-18 MED ORDER — FOLIC ACID 1 MG PO TABS
1.0000 mg | ORAL_TABLET | Freq: Every day | ORAL | Status: DC
  Administered 2021-11-18 – 2021-11-25 (×7): 1 mg via ORAL
  Filled 2021-11-18 (×8): qty 1

## 2021-11-18 MED ORDER — THIAMINE HCL 100 MG/ML IJ SOLN
100.0000 mg | Freq: Every day | INTRAMUSCULAR | Status: DC
  Administered 2021-11-19 – 2021-11-21 (×2): 100 mg via INTRAVENOUS
  Filled 2021-11-18 (×2): qty 2

## 2021-11-18 MED ORDER — THIAMINE HCL 100 MG PO TABS
100.0000 mg | ORAL_TABLET | Freq: Every day | ORAL | Status: DC
  Administered 2021-11-18 – 2021-11-20 (×2): 100 mg via ORAL
  Filled 2021-11-18 (×2): qty 1

## 2021-11-18 NOTE — Progress Notes (Signed)
Mobility Specialist Progress Note: ? ? 11/18/21 1118  ?Mobility  ?Activity Ambulated with assistance in hallway  ?Level of Assistance Modified independent, requires aide device or extra time  ?Assistive Device Front wheel walker  ?Distance Ambulated (ft) 550 ft  ?Activity Response Tolerated well  ?$Mobility charge 1 Mobility  ? ?Pt received in bed willing to participate in mobility. Complaints of incision pain. Left in chair with call bell in reach and all needs met.  ? ?Patrick Bailey ?Mobility Specialist ?Primary Phone [phone] ? ?

## 2021-11-18 NOTE — Plan of Care (Signed)
  Problem: Activity: Goal: Risk for activity intolerance will decrease Outcome: Progressing   Problem: Nutrition: Goal: Adequate nutrition will be maintained Outcome: Progressing   Problem: Pain Managment: Goal: General experience of comfort will improve Outcome: Progressing   Problem: Safety: Goal: Ability to remain free from injury will improve Outcome: Progressing   

## 2021-11-18 NOTE — TOC Progression Note (Signed)
Transition of Care (TOC) - Progression Note  ? ? ?Patient Details  ?Name: Najir Roop. ?MRN: [id number] ?Date of Birth: [birth year] ? ?Transition of Care (TOC) CM/SW Contact  ?Marilu Favre, RN ?Phone Number: ?11/18/2021, 11:12 AM ? ?Clinical Narrative:    ? ? ?Transition of Care (TOC) Screening Note ? ? ?Patient Details  ?Name: Danta Baumgardner. ?Date of Birth: May 31, 1961 ? ? ? ? ?Transition of Care Department [facility]) has reviewed patient and no TOC needs have been identified at this time. We will continue to monitor patient advancement through interdisciplinary progression rounds. If new patient transition needs arise, please place a TOC consult. ?  ? ?  ?  ? ?Expected Discharge Plan and Services ?  ?  ?  ?  ?  ?                ?  ?  ?  ?  ?  ?  ?  ?  ?  ?  ? ? ?Social Determinants of Health (SDOH) Interventions ?  ? ?Readmission Risk Interventions ?   ? View : No data to display.  ?  ?  ?  ? ? ?

## 2021-11-18 NOTE — Progress Notes (Signed)
? ? ?3 Days Post-Op  ?Subjective: ?CC: ?Feeling better today. 1.5L out from NGT in the last 24 hours. Less bloated/distended. Nausea improved. Some soreness around midline incision. No flatus or bm. Voiding. Ambulating in the halls. No current CP. Only feels sob if he falls behind on pain control.  ? ?Objective: ?Vital signs in last 24 hours: ?Temp:  [97.5 ?F (36.4 ?C)-98.2 ?F (36.8 ?C)] 98 ?F (36.7 ?C) (04/18 2683) ?Pulse Rate:  [92-130] 94 (04/18 0804) ?Resp:  [16-20] 18 (04/18 0804) ?BP: (115-151)/(80-103) 151/93 (04/18 1100) ?SpO2:  [92 %-95 %] 94 % (04/18 0804) ?Last BM Date :  (PTA) ? ?Intake/Output from previous day: ?04/17 0701 - 04/18 0700 ?In: 2525.9 [I.V.:2026.3; IV Piggyback:499.6] ?Out: 2100 [Urine:600; Emesis/NG output:1500] ?Intake/Output this shift: ?Total I/O ?In: 316.7 [I.V.:316.7] ?Out: -  ? ?PE: ?Gen:  Alert, NAD, pleasant ?Card:  Reg ?Pulm:  CTAB, no W/R/R, effort normal. On RA.  ?Abd: Moderate distension but soft. Appropriately tender around midline incision. No peritonitis. Hypoactive bowel sounds. Midline incision w/ staples in place, c/d/I without evidence of cellulitis or drainage.  ?Ext:  No LE edema  ?Psych: A&Ox3  ? ?Lab Results:  ?Recent Labs  ?  11/17/21 ?0716 11/17/21 ?4196  ?WBC 8.9 8.2  ?HGB 14.3 14.4  ?HCT 41.1 42.6  ?PLT 243 290  ? ?BMET ?Recent Labs  ?  11/16/21 ?2229 11/17/21 ?0716  ?NA 133* 136  ?K 4.6 4.1  ?CL 97* 103  ?CO2 28 23  ?GLUCOSE 124* 120*  ?BUN 10 18  ?CREATININE 1.13 1.00  ?CALCIUM 9.2 9.0  ? ?PT/INR ?No results for input(s): LABPROT, INR in the last 72 hours. ?CMP  ?   ?Component Value Date/Time  ? NA 136 11/17/2021 0716  ? NA 137 03/11/2014 1025  ? K 4.1 11/17/2021 0716  ? K 4.0 03/11/2014 1025  ? CL 103 11/17/2021 0716  ? CL 104 03/11/2014 1025  ? CO2 23 11/17/2021 0716  ? CO2 25 03/11/2014 1025  ? GLUCOSE 120 (H) 11/17/2021 0716  ? GLUCOSE 132 (H) 03/11/2014 1025  ? BUN 18 11/17/2021 0716  ? BUN 15 03/11/2014 1025  ? CREATININE 1.00 11/17/2021 0716  ?  CREATININE 1.06 12/01/2019 1627  ? CALCIUM 9.0 11/17/2021 0716  ? CALCIUM 8.6 03/11/2014 1025  ? PROT 7.8 11/14/2021 1845  ? PROT 7.1 02/23/2018 1627  ? ALBUMIN 4.3 11/14/2021 1845  ? AST 45 (H) 11/14/2021 1845  ? ALT 37 11/14/2021 1845  ? ALKPHOS 75 11/14/2021 1845  ? BILITOT 0.8 11/14/2021 1845  ? GFRNONAA >60 11/17/2021 0716  ? GFRNONAA >60 03/11/2014 1025  ? GFRAA >60 01/11/2020 1609  ? GFRAA >60 03/11/2014 1025  ? ?Lipase  ?   ?Component Value Date/Time  ? LIPASE 26 11/14/2021 1845  ? ? ?Studies/Results: ?DG Abd 1 View ? ?Result Date: 11/16/2021 ?CLINICAL DATA:  Abdominal distention, recent abdominal surgery EXAM: ABDOMEN - 1 VIEW COMPARISON:  CT done on 11/14/2021 FINDINGS: There is moderate dilation of small-bowel loops measuring up to 5.2 cm in diameter. Gas and stool are noted in colon. Skin staples are seen in the lower abdomen. No abnormal masses or calcifications are seen. Kidneys are partly obscured by bowel contents. IMPRESSION: There is moderate dilation of small-bowel loops suggesting ileus or partial small bowel obstruction. Electronically Signed   By: Elmer Picker M.D.   On: 11/16/2021 12:13  ? ?DG Abd Portable 1V ? ?Result Date: 11/18/2021 ?CLINICAL DATA:  Ileus, postop GI surgery. EXAM: PORTABLE ABDOMEN - 1  VIEW COMPARISON:  Abdominal radiographs 11/17/2021 FINDINGS: Enteric tube remains in place, terminating over the proximal aspect of the stomach. There is mild-to-moderate gaseous distension of multiple small bowel loops throughout the abdomen which has mildly progressed from the prior study. A small amount of gas is present in the colon. Midline abdominal skin staples are noted. IMPRESSION: Mildly progressive small bowel distension which may reflect ileus or obstruction. Electronically Signed   By: Logan Bores M.D.   On: 11/18/2021 08:10  ? ?DG Abd Portable 1V ? ?Result Date: 11/17/2021 ?CLINICAL DATA:  [id number]. MD states sudden increase in pt heart rate, so looking for anything that may  cause it, including the NG tube, pt is 2 days post op abd surgery, pt was sitting in a chair, so imaged there, pt just states he is tender from the incision EXAM: PORTABLE ABDOMEN - 1 VIEW COMPARISON:  None. FINDINGS: Enteric tube coursing below the hemidiaphragm with tip and side port overlying the expected region of the gastric lumen. Gaseous dilatation of several loops of small bowel. Limited evaluation free gas on AP portable semi erect radiograph. No radio-opaque calculi or other significant radiographic abnormality are seen. Skin staples overlie the abdomen. IMPRESSION: 1. Enteric tube in good position. 2. Small bowel gaseous dilatation. Finding may represent a postoperative ileus versus obstruction. Electronically Signed   By: Iven Finn M.D.   On: 11/17/2021 16:50  ? ?DG Abd Portable 1V ? ?Result Date: 11/16/2021 ?CLINICAL DATA:  Enteric catheter placement EXAM: PORTABLE ABDOMEN - 1 VIEW COMPARISON:  11/16/2021 FINDINGS: Frontal view of the lower chest and upper abdomen demonstrates enteric catheter coiled over the gastric fundus. Persistent small bowel distension, most consistent with ileus given recent laparotomy. Hypoventilatory changes are seen at the lung bases. IMPRESSION: 1. Enteric catheter coiled over the gastric fundus. 2. Persistent small bowel distension, favor postoperative ileus over obstruction. Electronically Signed   By: Randa Ngo M.D.   On: 11/16/2021 22:45   ? ?Anti-infectives: ?Anti-infectives (From admission, onward)  ? ? Start     Dose/Rate Route Frequency Ordered Stop  ? 11/14/21 2330  cefTRIAXone (ROCEPHIN) 2 g in sodium chloride 0.9 % 100 mL IVPB       ?See Hyperspace for full Linked Orders Report.  ? 2 g ?200 mL/hr over 30 Minutes Intravenous  Once 11/14/21 2328 11/15/21 0112  ? 11/14/21 2330  metroNIDAZOLE (FLAGYL) IVPB 500 mg       ?See Hyperspace for full Linked Orders Report.  ? 500 mg ?100 mL/hr over 60 Minutes Intravenous  Once 11/14/21 2328 11/15/21 0221  ? ?   ? ? ? ?Assessment/Plan ?POD 2 s/p lap converted to open right hemicolectomy.  ?- Ileus, cont NGT.  ?- Path pending ?- Mobilize ?- AM labs ? ?FEN - NPO, NGT. IVF, Consider TPN in AM if ileus persists.  ?VTE - SCDs, lovenox  ?ID - Rocephin/Flagyl periop, none currently ?Foley - Foley out, voiding ? ?Hx hypothyroidism - on hold in setting of ileus. Consider IV levothyroxine in am if ileus persists.  ?Hx HTN - prn meds ?Alcohol use - drinks 6 pack day. Sometimes 8+ beers. CIWA ?Hx CVA on plavix - plavix on hold in setting of ileus ? ? ? LOS: 2 days  ? ? ?Jillyn Ledger , PA-C ?[facility] ?11/18/2021, 11:48 AM ?Please see Amion for pager number during day hours 7:00am-4:30pm ? ?

## 2021-11-19 MED ORDER — ADULT MULTIVITAMIN LIQUID CH
15.0000 mL | Freq: Every day | ORAL | Status: DC
  Administered 2021-11-20: 15 mL via ORAL
  Filled 2021-11-19 (×3): qty 15

## 2021-11-19 MED ORDER — METOPROLOL TARTRATE 100 MG PO TABS
100.0000 mg | ORAL_TABLET | Freq: Two times a day (BID) | ORAL | Status: DC
  Administered 2021-11-19 – 2021-11-25 (×12): 100 mg via ORAL
  Filled 2021-11-19 (×12): qty 1

## 2021-11-19 MED ORDER — OXYCODONE HCL 5 MG PO TABS
5.0000 mg | ORAL_TABLET | ORAL | Status: DC | PRN
  Filled 2021-11-19: qty 1

## 2021-11-19 MED ORDER — LEVOTHYROXINE SODIUM 75 MCG PO TABS
150.0000 ug | ORAL_TABLET | Freq: Every day | ORAL | Status: DC
  Administered 2021-11-19 – 2021-11-25 (×7): 150 ug via ORAL
  Filled 2021-11-19 (×7): qty 2

## 2021-11-19 MED ORDER — CALCIUM CARBONATE ANTACID 500 MG PO CHEW
1.0000 | CHEWABLE_TABLET | Freq: Three times a day (TID) | ORAL | Status: DC | PRN
  Administered 2021-11-20 – 2021-11-25 (×7): 200 mg via ORAL
  Filled 2021-11-19 (×8): qty 1

## 2021-11-19 NOTE — Progress Notes (Signed)
4 Days Post-Op  ? ?Subjective/Chief Complaint: ?Had flatus and two bms this am, feels much better ? ? ?Objective: ?Vital signs in last 24 hours: ?Temp:  [97.5 ?F (36.4 ?C)-98.4 ?F (36.9 ?C)] 97.5 ?F (36.4 ?C) (04/19 4536) ?Pulse Rate:  [88-110] 88 (04/19 0752) ?Resp:  [18] 18 (04/19 0752) ?BP: (130-151)/(75-96) 136/93 (04/19 0752) ?SpO2:  [93 %-98 %] 98 % (04/19 0752) ?Last BM Date :  (PTA) ? ?Intake/Output from previous day: ?04/18 0701 - 04/19 0700 ?In: 1815 [P.O.:480; I.V.:1335] ?Out: 1135 [Emesis/NG output:1135] ?Intake/Output this shift: ?No intake/output data recorded. ? ?General nad ?Cv rrr ?Pulm nl effort ?Gi much softer, bs present wound clean, approp tender ? ?Lab Results:  ?Recent Labs  ?  11/17/21 ?0716 11/17/21 ?4680  ?WBC 8.9 8.2  ?HGB 14.3 14.4  ?HCT 41.1 42.6  ?PLT 243 290  ? ?BMET ?Recent Labs  ?  11/17/21 ?0716  ?NA 136  ?K 4.1  ?CL 103  ?CO2 23  ?GLUCOSE 120*  ?BUN 18  ?CREATININE 1.00  ?CALCIUM 9.0  ? ?PT/INR ?No results for input(s): LABPROT, INR in the last 72 hours. ?ABG ?No results for input(s): PHART, HCO3 in the last 72 hours. ? ?Invalid input(s): PCO2, PO2 ? ?Studies/Results: ?DG Abd Portable 1V ? ?Result Date: 11/18/2021 ?CLINICAL DATA:  Ileus, postop GI surgery. EXAM: PORTABLE ABDOMEN - 1 VIEW COMPARISON:  Abdominal radiographs 11/17/2021 FINDINGS: Enteric tube remains in place, terminating over the proximal aspect of the stomach. There is mild-to-moderate gaseous distension of multiple small bowel loops throughout the abdomen which has mildly progressed from the prior study. A small amount of gas is present in the colon. Midline abdominal skin staples are noted. IMPRESSION: Mildly progressive small bowel distension which may reflect ileus or obstruction. Electronically Signed   By: Logan Bores M.D.   On: 11/18/2021 08:10  ? ?DG Abd Portable 1V ? ?Result Date: 11/17/2021 ?CLINICAL DATA:  [id number]. MD states sudden increase in pt heart rate, so looking for anything that may cause it,  including the NG tube, pt is 2 days post op abd surgery, pt was sitting in a chair, so imaged there, pt just states he is tender from the incision EXAM: PORTABLE ABDOMEN - 1 VIEW COMPARISON:  None. FINDINGS: Enteric tube coursing below the hemidiaphragm with tip and side port overlying the expected region of the gastric lumen. Gaseous dilatation of several loops of small bowel. Limited evaluation free gas on AP portable semi erect radiograph. No radio-opaque calculi or other significant radiographic abnormality are seen. Skin staples overlie the abdomen. IMPRESSION: 1. Enteric tube in good position. 2. Small bowel gaseous dilatation. Finding may represent a postoperative ileus versus obstruction. Electronically Signed   By: Iven Finn M.D.   On: 11/17/2021 16:50   ? ?Anti-infectives: ?Anti-infectives (From admission, onward)  ? ? Start     Dose/Rate Route Frequency Ordered Stop  ? 11/14/21 2330  cefTRIAXone (ROCEPHIN) 2 g in sodium chloride 0.9 % 100 mL IVPB       ?See Hyperspace for full Linked Orders Report.  ? 2 g ?200 mL/hr over 30 Minutes Intravenous  Once 11/14/21 2328 11/15/21 0112  ? 11/14/21 2330  metroNIDAZOLE (FLAGYL) IVPB 500 mg       ?See Hyperspace for full Linked Orders Report.  ? 500 mg ?100 mL/hr over 60 Minutes Intravenous  Once 11/14/21 2328 11/15/21 0221  ? ?  ? ? ?Assessment/Plan: ?POD 3 s/p lap converted to open right hemicolectomy.  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  ?FEN - NPO, NGT. IVF,  ?VTE - SCDs, lovenox  ?ID - Rocephin/Flagyl periop, none currently ?Foley - Foley out, voiding ?  ?Hx hypothyroidism - will restart home meds today ?Hx HTN - prn meds ?Alcohol use - drinks 6 pack day. Sometimes 8+ beers. CIWA ?Hx CVA on plavix - plavix on hold in setting of  ileus ? ?Rolm Bookbinder ?11/19/2021 ? ?

## 2021-11-19 NOTE — Progress Notes (Signed)
Mobility Specialist Progress Note: ? ? 11/19/21 1035  ?Mobility  ?Activity Ambulated with assistance in hallway  ?Level of Assistance Modified independent, requires aide device or extra time  ?Assistive Device Front wheel walker  ?Distance Ambulated (ft) 800 ft  ?Activity Response Tolerated well  ?$Mobility charge 1 Mobility  ? ?Pt received in bed willing to participate in mobility. No complaints of pain. Left in bed with call bell in reach and all needs met.   ? ?Patrick Bailey ?Mobility Specialist ?Primary Phone [phone] ? ?

## 2021-11-19 NOTE — Progress Notes (Signed)
Mobility Specialist Progress Note: ? ? 11/19/21 1445  ?Mobility  ?Activity Ambulated with assistance in hallway  ?Level of Assistance Modified independent, requires aide device or extra time  ?Assistive Device Front wheel walker  ?Distance Ambulated (ft) 570 ft  ?Activity Response Tolerated well  ?$Mobility charge 1 Mobility  ? ?Pt received in bed willing to participate in mobility. No complaints of pain. Pt left in bed with call bell in reach and all needs met.  ? ?Marria Mathison ?Mobility Specialist ?Primary Phone [phone] ? ?

## 2021-11-20 ENCOUNTER — Encounter: Payer: Self-pay | Admitting: *Deleted

## 2021-11-20 ENCOUNTER — Other Ambulatory Visit: Payer: Self-pay | Admitting: *Deleted

## 2021-11-20 DIAGNOSIS — C181 Malignant neoplasm of appendix: Secondary | ICD-10-CM

## 2021-11-20 NOTE — Discharge Instructions (Signed)
[facility], [place] ?[phone] ? ?OPEN ABDOMINAL SURGERY: POST OP INSTRUCTIONS ? ?Always review your discharge instruction sheet given to you by the facility where your surgery was performed. ? ?IF YOU HAVE DISABILITY OR FAMILY LEAVE FORMS, YOU MUST BRING THEM TO THE OFFICE FOR PROCESSING.  PLEASE DO NOT GIVE THEM TO YOUR DOCTOR. ? ?A prescription for pain medication may be given to you upon discharge.  Take your pain medication as prescribed, if needed.  If narcotic pain medicine is not needed, then you may take acetaminophen (Tylenol) or ibuprofen (Advil) as needed. ?Take your usually prescribed medications unless otherwise directed. ?If you need a refill on your pain medication, please contact your pharmacy. They will contact our office to request authorization.  Prescriptions will not be filled after 5pm or on week-ends. ?You should follow a light diet the first few days after arrival home, such as soup and crackers, pudding, etc.unless your doctor has advised otherwise. A high-fiber, low fat diet can be resumed as tolerated.   Be sure to include lots of fluids daily. Most patients will experience some swelling and bruising on the chest and neck area.  Ice packs will help.  Swelling and bruising can take several days to resolve ?Most patients will experience some swelling and bruising in the area of the incision. Ice pack will help. Swelling and bruising can take several days to resolve.Marland Kitchen  ?It is common to experience some constipation if taking pain medication after surgery.  Increasing fluid intake and taking a stool softener will usually help or prevent this problem from occurring.  A mild laxative (Milk of Magnesia or Miralax) should be taken according to package directions if there are no bowel movements after 48 hours. ? You may have steri-strips (small skin tapes) in place directly over the incision.  These strips should be left on the skin for 7-10 days.  If your surgeon used skin  glue on the incision, you may shower in 24 hours.  The glue will flake off over the next 2-3 weeks.  Any sutures or staples will be removed at the office during your follow-up visit. You may find that a light gauze bandage over your incision may keep your staples from being rubbed or pulled. You may shower and replace the bandage daily. ?ACTIVITIES:  You may resume regular (light) daily activities beginning the next day--such as daily self-care, walking, climbing stairs--gradually increasing activities as tolerated.  You may have sexual intercourse when it is comfortable.  Refrain from any heavy lifting or straining until approved by your doctor - no lifting greater than 15 pounds for 6 weeks from surgery ?You may drive when you no longer are taking prescription pain medication, you can comfortably wear a seatbelt, and you can safely maneuver your car and apply brakes ?Return to Work: ___________________________________ ?You should see your doctor in the office for a follow-up appointment approximately two weeks after your surgery.  Make sure that you call for this appointment within a day or two after you arrive home to insure a convenient appointment time. ?OTHER INSTRUCTIONS:  ?_____________________________________________________________ ?_____________________________________________________________ ? ?WHEN TO CALL YOUR DOCTOR: ?Fever over 101.0 ?Inability to urinate ?Nausea and/or vomiting ?Extreme swelling or bruising ?Continued bleeding from incision. ?Increased pain, redness, or drainage from the incision. ?Difficulty swallowing or breathing ?Muscle cramping or spasms. ?Numbness or tingling in hands or feet or around lips. ? ?The clinic staff is available to answer your questions during regular business hours.  Please don?t hesitate to call and ask to speak to one of the nurses if you have concerns. ? ?For further questions, please visit [email or web address] ? ?

## 2021-11-20 NOTE — Plan of Care (Signed)
  Problem: Nutrition: Goal: Adequate nutrition will be maintained Outcome: Progressing   Problem: Pain Managment: Goal: General experience of comfort will improve Outcome: Progressing   Problem: Safety: Goal: Ability to remain free from injury will improve Outcome: Progressing   

## 2021-11-20 NOTE — Discharge Summary (Signed)
[facility] ?Discharge Summary  ? ?Patient ID: ?Patrick Bailey. ?MRN: [id number] ?DOB/AGE: May 29, 1961 61 y.o. ? ?Admit date: 11/14/2021 ?Discharge date: 11/25/2021 ? ?Admitting Diagnosis: ?Appendicitis [K37] ?Acute appendicitis with localized peritonitis, without perforation, abscess, or gangrene [K35.30] ?Mass of appendix [K38.8] ? ? ?Discharge Diagnosis ?Appendicitis [K37] ?Acute appendicitis with localized peritonitis, without perforation, abscess, or gangrene [K35.30] ?Mass of appendix [K38.8] ? ?Consultants ?None ? ?Imaging: ?No results found. ? ?Procedures ?Dr. Zenia Resides (11/15/21) - Laparoscopic converted to open ileocecectomy with ileocolic anastomosis ? ?Hospital Course:  ?61 year old male who presented to [facility] ED with right lower quadrant pain on 4/15. He had been seen by surgery team in ED on prior presentation (4/11) for abdominal pain with imaging showing mucocele of the appendix and had been discharged with follow up as his symptoms improved. He returned to ED for worsening pain.  Workup was concerning for appendicitis.  Patient was admitted and underwent procedure listed above.  Tolerated procedure well and was transferred to the floor. 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 He underwent CT chest/abdomen/pelvis for staging which showed no evidence of metastatic disease in the chest. Case was discussed with hematology/oncology Dr. Benay Spice and he will follow up with him within 2 weeks of surgery.   ?Post operative course was significant for ileus for which he had an NG tube. This was clamped and then removed as symptoms improved. Diet was advanced as tolerated.  On POD#10, the patient was voiding well, tolerating diet, ambulating well, pain well controlled, vital signs stable, incisions c/d/i and felt stable for discharge home.  Patient will follow up in our  office for staple removal. He will follow up with Dr. Zenia Resides in 2-3 weeks for post surgical follow up and knows to call with questions or concerns.  He will call to confirm appointment date/time.   ? ?Physical Exam: ?General:  Alert, NAD, pleasant, comfortable ?Abd:  Soft, ND, mild tenderness, incisions C/D/I w/ staples in place ? ?I or a member of my team have reviewed this patient in the Controlled Substance Database. ? ? ?Allergies as of 11/25/2021   ?No Known Allergies ?  ? ?  ?Medication List  ?  ? ?STOP taking these medications   ? ?valACYclovir 1000 MG tablet ?Commonly known as: VALTREX ?  ? ?  ? ?TAKE these medications   ? ?acetaminophen 325 MG tablet ?Commonly known as: TYLENOL ?Take 2 tablets (650 mg total) by mouth every 6 (six) hours as needed. ?  ?amitriptyline 50 MG tablet ?Commonly known as: ELAVIL ?TAKE 1 & 1/2 (ONE & ONE-HALF) TABLETS BY MOUTH AT BEDTIME ?What changed: See the new instructions. ?  ?amLODipine 10 MG tablet ?Commonly known as: NORVASC ?Take 1 tablet by mouth once daily ?  ?atorvastatin 40 MG tablet ?Commonly known as: LIPITOR ?Take 1 tablet by mouth once daily ?  ?clopidogrel 75 MG tablet ?Commonly known as: PLAVIX ?Take 1 tablet by mouth once daily ?  ?fenofibrate 160 MG tablet ?Take 1 tablet by mouth once daily ?  ?Fish Oil 1000 MG Caps ?Take 2 capsules (2,000 mg total) by mouth 2 (two) times a day. ?  ?gabapentin 400 MG capsule ?Commonly known as: NEURONTIN ?TAKE 1 CAPSULE BY MOUTH TWICE DAILY AND 2 AT BEDTIME ?What changed:  ?how much to take ?how to take this ?when to take this ?additional instructions ?  ?lansoprazole 15 MG capsule ?Commonly known as:  Prevacid ?Take 1 capsule (15 mg total) by mouth daily at 12 noon. ?What changed: when to take this ?  ?levothyroxine 150 MCG tablet ?Commonly known as: SYNTHROID ?Take 1 tablet by mouth once daily ?What changed: when to take this ?  ?metoprolol tartrate 100 MG tablet ?Commonly known as: LOPRESSOR ?Take 1 tablet by mouth twice daily ?   ?Mitigare 0.6 MG Caps ?Generic drug: Colchicine ?Take 0.6 mg by mouth daily as needed (gout flare). TAKE 1 CAPSULE BY MOUTH IN THE MORNING AS NEEDED ?What changed: additional instructions ?  ?sodium fluoride 1.1 % Gel dental gel ?Commonly known as: DentaGel ?Place 1 application onto teeth at bedtime. ?  ? ?  ? ? ? ? Follow-up Information   ? ? Ladell Pier, MD Follow up.   ?Specialty: Oncology ?Why: you should be contacted by scheduler to set up an appointment ?Contact information: ?[address] ?[place] [address] ?[phone] ? ? ?  ?  ? ? [facility], [facility]. Call.   ?Specialty: General Surgery ?Why: Follow up with nurse in 2 weeks from surgery for staple removal. We are working to schedule your appointment for you, please call to confirm appointment date and time. Please arrive 30 minutes early to complete check in process and bring photo ID and insurance card if you have them ?Contact information: ?[address] ?[address] ?[place] [address] ?[phone] ? ? ?  ?  ? ? Dwan Bolt, MD Follow up.   ?Specialty: General Surgery ?Why: surgical follow up. We are working to schedule your appointment for you, please call to confirm appointment date and time. Please arrive 15 minutes early to complete check in process and bring photo ID and insurance card if you have them ?Contact information: ?[address] ?[place] [address] ?[phone] ? ? ?  ?  ? ?  ?  ? ?  ? ? ?Signed: ?Obie Dredge, PA-C ?[facility] ?11/25/2021, 4:00 PM ?Please see Amion for pager number during day hours 7:00am-4:30pm ? ? ? ?

## 2021-11-20 NOTE — Social Work (Signed)
CSW received substance use consult. CSW met with pt at bedside. Pt reports "Ive never done drugs in my life". Resources are not needed at this time. ? ?Emeterio Reeve, LCSW ?Clinical Social Worker ? ?

## 2021-11-20 NOTE — Progress Notes (Signed)
Mobility Specialist Progress Note: ? ? 11/20/21 1607  ?Mobility  ?Activity Ambulated with assistance in hallway  ?Level of Assistance Independent after set-up  ?Assistive Device Front wheel walker  ?Distance Ambulated (ft) 550 ft  ?Activity Response Tolerated well  ?$Mobility charge 1 Mobility  ? ?Complaints of stomach feeling tight. Left in bed with call bell in reach and all needs met.  ? ?Hilary Milks ?Mobility Specialist ?Primary Phone [phone] ? ?

## 2021-11-20 NOTE — Progress Notes (Signed)
5 Days Post-Op  ? ?Subjective/Chief Complaint: ?Passing some flatus and has had several Bms - last one this morning around 0730. NGT clamped yesterday and he has been drinking clears. Felt more bloated and with abdominal pain overnight so hooked back up to suction briefly around 0600 and 500 ml output charted ? ?Objective: ?Vital signs in last 24 hours: ?Temp:  [97.5 ?F (36.4 ?C)-98.2 ?F (36.8 ?C)] 98.2 ?F (36.8 ?C) (04/20 0555) ?Pulse Rate:  [87-98] 87 (04/20 0555) ?Resp:  [16-18] 17 (04/20 0555) ?BP: (113-140)/(78-93) 113/78 (04/20 0555) ?SpO2:  [94 %-98 %] 98 % (04/20 0555) ?Last BM Date :  (PTA) ? ?Intake/Output from previous day: ?04/19 0701 - 04/20 0700 ?In: 48 [NG/GT:60] ?Out: 500 [Emesis/NG output:500] ?Intake/Output this shift: ?No intake/output data recorded. ? ?PE ?General: pleasant, WD, male who is laying in bed in NAD ?Lungs: Respiratory effort nonlabored ?Abd: soft, mild distension. Hypoactive BS. Mild TTP along midline incision and LLQ near port site. No rebound or guarding. NGT currently clamped with thin clear yellow output in cannister. Midline incision with staples c/d/I. Port sites with incision c/d/I surgical glue/ ?MSK: all 4 extremities are symmetrical with no cyanosis, clubbing, or edema. ?Skin: warm and dry with no masses, lesions, or rashes ?Psych: A&Ox3 with an appropriate affect.  ? ? ?Lab Results:  ?Recent Labs  ?  11/17/21 ?1654  ?WBC 8.2  ?HGB 14.4  ?HCT 42.6  ?PLT 290  ? ? ?BMET ?No results for input(s): NA, K, CL, CO2, GLUCOSE, BUN, CREATININE, CALCIUM in the last 72 hours. ? ?PT/INR ?No results for input(s): LABPROT, INR in the last 72 hours. ?ABG ?No results for input(s): PHART, HCO3 in the last 72 hours. ? ?Invalid input(s): PCO2, PO2 ? ?Studies/Results: ?No results found. ? ?Anti-infectives: ?Anti-infectives (From admission, onward)  ? ? Start     Dose/Rate Route Frequency Ordered Stop  ? 11/14/21 2330  cefTRIAXone (ROCEPHIN) 2 g in sodium chloride 0.9 % 100 mL IVPB       ?See  Hyperspace for full Linked Orders Report.  ? 2 g ?200 mL/hr over 30 Minutes Intravenous  Once 11/14/21 2328 11/15/21 0112  ? 11/14/21 2330  metroNIDAZOLE (FLAGYL) IVPB 500 mg       ?See Hyperspace for full Linked Orders Report.  ? 500 mg ?100 mL/hr over 60 Minutes Intravenous  Once 11/14/21 2328 11/15/21 0221  ? ?  ? ? ?Assessment/Plan: ?POD 5 s/p lap converted to open right hemicolectomy.  ?- Ileus improved but distension overnight with NGT clamped and hooked up to suction with 500 ml residual. He had been drinking clears. Has had multiple Bms. Leave clamped and clears for now - if any more distension/abdominal pain then resume LIWS. If tolerates clamping today discontinue NGT this afternoon ?- Path with appendiceal adenoca (pT4) with tumor deposits in mesentery and 2/20 nodes positive, discussed with patient -  will follow up with Dr. Benay Spice outpatient ?- CT chest for staging today ?- Mobilize ?  ?FEN - NGT clamped. clears. IVF,  ?VTE - SCDs, lovenox, holding plavix ?ID - Rocephin/Flagyl periop, none currently ?Foley - Foley out, voiding ?  ?Hx hypothyroidism - restart home meds ?Hx HTN - prn meds ?Alcohol use - initially reports drinks 6 pack day. Sometimes 8+ beers. Today he states he has not done that for a while but instead drinks 6-8 beers on weekends only. No history of withdrawal. CIWA protocol expiring. Will not reorder ?Hx CVA on plavix - plavix on hold in setting of ileus ? ?  Winferd Humphrey, PA-C ?[facility] ?11/20/2021, 9:11 AM ?Please see Amion for pager number during day hours 7:00am-4:30pm ? ?

## 2021-11-20 NOTE — Progress Notes (Signed)
Referral received for Hem/Onc  from Dr Michaelle Birks, pt to be scheduled within 2 weeks of surgery ?

## 2021-11-20 NOTE — Progress Notes (Signed)
Spoke with Patrick Bailey to confirm New Patient appt with Dr Benay Spice for 12/01/21 at 1:40 pm.   ? ?Will call after patient D/C from hospital, prior to appt. ? ?Introduced as Art therapist and gave him contact information ?

## 2021-11-21 ENCOUNTER — Inpatient Hospital Stay (HOSPITAL_COMMUNITY): Payer: BC Managed Care – PPO

## 2021-11-21 ENCOUNTER — Inpatient Hospital Stay: Payer: Self-pay

## 2021-11-21 LAB — BASIC METABOLIC PANEL
Anion gap: 7 (ref 5–15)
BUN: 13 mg/dL (ref 8–23)
CO2: 23 mmol/L (ref 22–32)
Calcium: 8.7 mg/dL — ABNORMAL LOW (ref 8.9–10.3)
Chloride: 105 mmol/L (ref 98–111)
Creatinine, Ser: 0.89 mg/dL (ref 0.61–1.24)
GFR, Estimated: >60 mL/min (ref >60)
Glucose, Bld: 94 mg/dL (ref 70–99)
Potassium: 4.1 mmol/L (ref 3.5–5.1)
Sodium: 135 mmol/L (ref 135–145)

## 2021-11-21 LAB — CBC
HCT: 35.7 % — ABNORMAL LOW (ref 39.0–52.0)
Hemoglobin: 11.9 g/dL — ABNORMAL LOW (ref 13.0–17.0)
MCH: 31.6 pg (ref 26.0–34.0)
MCHC: 33.3 g/dL (ref 30.0–36.0)
MCV: 94.7 fL (ref 80.0–100.0)
Platelets: 330 10*3/uL (ref 150–400)
RBC: 3.77 MIL/uL — ABNORMAL LOW (ref 4.22–5.81)
RDW: 12.1 % (ref 11.5–15.5)
WBC: 2.8 10*3/uL — ABNORMAL LOW (ref 4.0–10.5)
nRBC: 0 % (ref 0.0–0.2)

## 2021-11-21 LAB — MAGNESIUM: Magnesium: 1.7 mg/dL (ref 1.7–2.4)

## 2021-11-21 LAB — PHOSPHORUS: Phosphorus: 3.8 mg/dL (ref 2.5–4.6)

## 2021-11-21 MED ORDER — SODIUM FLUORIDE 1.1 % DT GEL
1.0000 "application " | Freq: Every day | DENTAL | Status: DC
  Administered 2021-11-23 – 2021-11-24 (×2): 1 via DENTAL

## 2021-11-21 MED ORDER — SODIUM CHLORIDE 0.9% FLUSH
10.0000 mL | Freq: Two times a day (BID) | INTRAVENOUS | Status: DC
  Administered 2021-11-21 – 2021-11-25 (×5): 10 mL

## 2021-11-21 MED ORDER — TRAVASOL 10 % IV SOLN
INTRAVENOUS | Status: AC
  Filled 2021-11-21: qty 600

## 2021-11-21 MED ORDER — MAGNESIUM SULFATE 2 GM/50ML IV SOLN
2.0000 g | Freq: Once | INTRAVENOUS | Status: AC
  Administered 2021-11-21: 2 g via INTRAVENOUS
  Filled 2021-11-21: qty 50

## 2021-11-21 MED ORDER — MORPHINE SULFATE (PF) 2 MG/ML IV SOLN
2.0000 mg | INTRAVENOUS | Status: DC | PRN
  Administered 2021-11-21 – 2021-11-22 (×6): 2 mg via INTRAVENOUS
  Filled 2021-11-21 (×6): qty 1

## 2021-11-21 MED ORDER — INSULIN ASPART 100 UNIT/ML IJ SOLN
0.0000 [IU] | Freq: Four times a day (QID) | INTRAMUSCULAR | Status: DC
  Administered 2021-11-24: 1 [IU] via SUBCUTANEOUS

## 2021-11-21 MED ORDER — POTASSIUM CHLORIDE IN NACL 20-0.9 MEQ/L-% IV SOLN: INTRAVENOUS | Status: AC

## 2021-11-21 MED ORDER — OXYCODONE HCL 5 MG PO TABS: 5.0000 mg | ORAL_TABLET | ORAL | Status: DC | PRN

## 2021-11-21 MED ORDER — SODIUM CHLORIDE 0.9% FLUSH: 10.0000 mL | INTRAVENOUS | Status: DC | PRN

## 2021-11-21 MED ORDER — IOHEXOL 300 MG/ML  SOLN
100.0000 mL | Freq: Once | INTRAMUSCULAR | Status: AC | PRN
Start: 2021-11-21 — End: 2021-11-21
  Administered 2021-11-21: 75 mL via INTRAVENOUS

## 2021-11-21 MED ORDER — METHOCARBAMOL 1000 MG/10ML IJ SOLN
1000.0000 mg | Freq: Three times a day (TID) | INTRAVENOUS | Status: DC
  Administered 2021-11-21 – 2021-11-24 (×8): 1000 mg via INTRAVENOUS
  Filled 2021-11-21 (×10): qty 10
  Filled 2021-11-21: qty 1000
  Filled 2021-11-21: qty 10

## 2021-11-21 MED ORDER — KETOROLAC TROMETHAMINE 15 MG/ML IJ SOLN
15.0000 mg | Freq: Four times a day (QID) | INTRAMUSCULAR | Status: DC | PRN
  Administered 2021-11-24: 15 mg via INTRAVENOUS
  Filled 2021-11-21: qty 1

## 2021-11-21 NOTE — Progress Notes (Signed)
PHARMACY - TOTAL PARENTERAL NUTRITION CONSULT NOTE  ? ?Indication: Prolonged ileus ? ?Patient Measurements: ?Height: '5\' 10"'$  (177.8 cm) ?Weight: 96.3 kg (212 lb 4 oz) ?IBW/kg (Calculated) : 73 ?TPN AdjBW (KG): 78.8 ?Body mass index is 30.45 kg/m?. ?Usual Weight: ~93 kg ? ?Assessment:  ?61 y.o. M s/p lap converted to open R hemicolectomy on 4/15. Pt continues with bloating/abd pain. Xray 4/21 shows ileus vs partial SBO. Pt is NPO with NGT to suction. ? ?Noted pt with h/o ETOH use. 6-8 beers on weekends only per pt (initially reported 6 beers/day). No withdrawal noted. On CIWA protocol. ? ?Glucose / Insulin: No h/o DM. CBG <120 ?Electrolytes: Mg 1.7 (goal >/=2), K 4.1 (goal >/=4), Phos 3.8. ?Renal: SCr ~1, BUN 13. ?Hepatic: Last LFTs 4/14 wnl. ?Intake / Output; MIVF: NGT 850 past 12 hrs. UOP not charted. ?MIVF: NS with 109mq KCL at 1041mhr ?GI Imaging: ?Abd xray 4/16 small bowel distention, likely post-op ileus ?Abd xray 4/18 Mildly progressive small bowel distension which may reflect ileus or obstruction ?Abd xray 4/21 similar appearing ileus vs SBO ?GI Surgeries / Procedures:  ?4/15 lap converted to open R hemicolectomy  ? ?Central access: PICC to be placed 4/21 ?TPN start date: 11/21/21 ? ?Nutritional Goals: (estimated) ?1900-2100 kcal and 105-110 gm protein ? ?RD Assessment: ? Pending ? ?Current Nutrition:  ?NPO ? ?Plan:  ?Start TPN at 5026mr at 1800. Will advance to goal as tolerated ?Magnesium sulfate 2gm IV x1 ?Electrolytes in TPN: Na 54m42m, K 54mE76m Ca 5mEq/59mMg 5mEq/L61mnd Phos 15mmol/11ml:Ac 1:1 ?Add standard MVI and trace elements to TPN ?Add thiamine IV to TPN x 5 days through 4/25 (h/o ETOH use and refeeding risk) ?Initiate Sensitive q6h SSI and adjust as needed  ?Reduce MIVF to 50 mL/hr at 1800 ?Monitor TPN labs daily until stable then on Mon/Thurs ? ?Vallorie Niccoli AmSherlon Handing, BCPS ?Please see amion for complete clinical pharmacist phone list ?11/21/2021,12:38 PM ? ?

## 2021-11-21 NOTE — Progress Notes (Signed)
Mobility Specialist Progress Note: ? ? 11/21/21 1004  ?Mobility  ?Activity Ambulated with assistance in hallway  ?Level of Assistance Independent after set-up  ?Assistive Device Front wheel walker  ?Distance Ambulated (ft) 800 ft  ?Activity Response Tolerated well  ?$Mobility charge 1 Mobility  ? ?Pt received EOB willing to participate in mobility. No complaints of pain. Left in bed with call bell in reach and all needs met.  ? ?Domenico Achord ?Mobility Specialist ?Primary Phone [phone] ? ?

## 2021-11-21 NOTE — Progress Notes (Signed)
Peripherally Inserted Central Catheter Placement ? ?The IV Nurse has discussed with the patient and/or persons authorized to consent for the patient, the purpose of this procedure and the potential benefits and risks involved with this procedure.  The benefits include less needle sticks, lab draws from the catheter, and the patient may be discharged home with the catheter. Risks include, but not limited to, infection, bleeding, blood clot (thrombus formation), and puncture of an artery; nerve damage and irregular heartbeat and possibility to perform a PICC exchange if needed/ordered by physician.  Alternatives to this procedure were also discussed.  Bard Power PICC patient education guide, fact sheet on infection prevention and patient information card has been provided to patient /or left at bedside.   ? ?PICC Placement Documentation  ?PICC Double Lumen 11/21/21 Right Brachial 38 cm 0 cm (Active)  ?Indication for Insertion or Continuance of Line Administration of hyperosmolar/irritating solutions (i.e. TPN, Vancomycin, etc.) 11/21/21 1800  ?Exposed Catheter (cm) 0 cm 11/21/21 1800  ?Site Assessment Clean, Dry, Intact 11/21/21 1800  ?Lumen #1 Status Flushed;Saline locked;Blood return noted 11/21/21 1800  ?Lumen #2 Status Flushed;Saline locked;Blood return noted 11/21/21 1800  ?Dressing Type Securing device;Transparent 11/21/21 1800  ?Dressing Status Antimicrobial disc in place 11/21/21 1800  ?Safety Lock Not Applicable 63/01/60 1093  ?Line Care Connections checked and tightened 11/21/21 1800  ?Dressing Intervention New dressing 11/21/21 1800  ?Dressing Change Due 11/28/21 11/21/21 1800  ? ? ? ? ? ?Holley Bouche Renee ?11/21/2021, 6:35 PM ? ?

## 2021-11-21 NOTE — Progress Notes (Signed)
6 Days Post-Op  ? ?Subjective/Chief Complaint: ?Had some increases bloating with CLD yesterday and NGT back to LIWS in the afternoon. He states his PO intake after being back to suction was at least 3 cups of ice. He is still bloated and having pain requiring IV pain medication.  ? ?Objective: ?Vital signs in last 24 hours: ?Temp:  [97.7 ?F (36.5 ?C)-99.3 ?F (37.4 ?C)] 97.9 ?F (36.6 ?C) (04/21 0734) ?Pulse Rate:  [81-107] 97 (04/21 0734) ?Resp:  [16-20] 16 (04/21 0734) ?BP: (106-130)/(74-91) 107/74 (04/21 0734) ?SpO2:  [96 %-99 %] 97 % (04/21 0734) ?Last BM Date :  (PTA) ? ?Intake/Output from previous day: ?04/20 0701 - 04/21 0700 ?In: 4362.7 [I.V.:4362.7] ?Out: 1100 [Emesis/NG output:1100] ?Intake/Output this shift: ?No intake/output data recorded. ? ?PE ?General: pleasant, WD, male who is laying in bed in NAD ?Lungs: Respiratory effort nonlabored ?Abd: soft, mild distension. Hypoactive BS. Mild TTP along midline incision and LLQ near port site. No rebound or guarding. NGT currently to LIWS with thin clear yellow output in cannister. Midline incision with staples c/d/I. Port sites with incision c/d/I surgical glue ?MSK: all 4 extremities are symmetrical with no cyanosis, clubbing, or edema. ?Skin: warm and dry with no masses, lesions, or rashes ?Psych: A&Ox3 with an appropriate affect.  ? ? ?Lab Results:  ?No results for input(s): WBC, HGB, HCT, PLT in the last 72 hours. ? ?BMET ?No results for input(s): NA, K, CL, CO2, GLUCOSE, BUN, CREATININE, CALCIUM in the last 72 hours. ? ?PT/INR ?No results for input(s): LABPROT, INR in the last 72 hours. ?ABG ?No results for input(s): PHART, HCO3 in the last 72 hours. ? ?Invalid input(s): PCO2, PO2 ? ?Studies/Results: ?CT CHEST W CONTRAST ? ?Result Date: 11/21/2021 ?CLINICAL DATA:  Staging, metastatic colon cancer. EXAM: CT CHEST WITH CONTRAST TECHNIQUE: Multidetector CT imaging of the chest was performed during intravenous contrast administration. RADIATION DOSE REDUCTION:  This exam was performed according to the departmental dose-optimization program which includes automated exposure control, adjustment of the mA and/or kV according to patient size and/or use of iterative reconstruction technique. CONTRAST:  38m OMNIPAQUE IOHEXOL 300 MG/ML  SOLN COMPARISON:  CT abdomen and pelvis 11/14/2021 abdominal x-ray 11/18/2021. FINDINGS: Cardiovascular: Heart is mildly enlarged. Aorta is normal in size. There is no pericardial effusion. There are atherosclerotic calcifications of the aorta and coronary arteries. Mediastinum/Nodes: No enlarged mediastinal, hilar, or axillary lymph nodes. Thyroid gland, trachea, and esophagus demonstrate no significant findings. Lungs/Pleura: There are mild paraseptal emphysematous changes in the lung apices with some biapical scarring. There is minimal dependent atelectasis in the bilateral lower lobes and minimal atelectasis in the lingula. No pulmonary nodules are seen. No pleural effusion or pneumothorax. Upper Abdomen: Enteric tube extends into the stomach. Dilated small bowel loop in the left upper quadrant measures 4.5 cm as seen on most recent comparison x-ray. Musculoskeletal: No chest wall abnormality. No acute or significant osseous findings. IMPRESSION: 1. No evidence for metastatic disease in the chest. 2. No acute cardiopulmonary process. 3. Stable dilated small bowel loops which may represent small-bowel obstruction or ileus. 4. Mild cardiomegaly. 5. Aortic Atherosclerosis (ICD10-I70.0) and Emphysema (ICD10-J43.9). Electronically Signed   By: ARonney AstersM.D.   On: 11/21/2021 01:48   ? ?Anti-infectives: ?Anti-infectives (From admission, onward)  ? ? Start     Dose/Rate Route Frequency Ordered Stop  ? 11/14/21 2330  cefTRIAXone (ROCEPHIN) 2 g in sodium chloride 0.9 % 100 mL IVPB       ?See Hyperspace for full Linked  Orders Report.  ? 2 g ?200 mL/hr over 30 Minutes Intravenous  Once 11/14/21 2328 11/15/21 0112  ? 11/14/21 2330  metroNIDAZOLE  (FLAGYL) IVPB 500 mg       ?See Hyperspace for full Linked Orders Report.  ? 500 mg ?100 mL/hr over 60 Minutes Intravenous  Once 11/14/21 2328 11/15/21 0221  ? ?  ? ? ?Assessment/Plan: ?POD 6 s/p lap converted to open right hemicolectomy Dr. Zenia Resides 4/15 ?- having bowel function but still with intermittent bloating abdominal pain. 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 Benay Spice outpatient ?- CT chest without evidence of metastatic disease. Does continue to show dilated small bowel loops on scout film ?- Pain control: decreased morphine 4>2 and added scheduled robaxin. ?- Mobilize ?- check labs this am ?  ?FEN - NGT LIWS. IVF NaCl with Kcl @ 100 ml/hr ?VTE - SCDs, lovenox, holding plavix ?ID - Rocephin/Flagyl periop, none currently ?Foley - Foley out, voiding ?  ?Hx hypothyroidism - restart home meds ?Hx HTN - prn meds ?Alcohol use - initially reports drinks 6 pack day. Sometimes 8+ beers. Today he states he has not done that for a while but instead drinks 6-8 beers on weekends only. No history of withdrawal. CIWA protocol expiring. Will not reorder ?Hx CVA on plavix - plavix on hold in setting of ileus ? ?Winferd Humphrey, PA-C ?[facility] ?11/21/2021, 9:17 AM ?Please see Amion for pager number during day hours 7:00am-4:30pm ? ?

## 2021-11-21 NOTE — Progress Notes (Signed)
Initial Nutrition Assessment ? ?DOCUMENTATION CODES:  ? ?Obesity unspecified ? ?INTERVENTION:  ? ?-TPN management per pharmacy ?-RD will follow for diet advancement and add supplements as appropriate  ? ?NUTRITION DIAGNOSIS:  ? ?Increased nutrient needs related to post-op healing as evidenced by estimated needs. ? ?GOAL:  ? ?Patient will meet greater than or equal to 90% of their needs ? ?MONITOR:  ? ?Diet advancement, Labs, Weight trends, Skin, I & O's ? ?REASON FOR ASSESSMENT:  ? ?Consult ?New TPN/TNA ? ?ASSESSMENT:  ? ?61 year old male evaluated by our team 4/11 for right lower quadrant pain.  Imaging it looks consistent with a mucocele of the appendix.  He was discharged with surgical follow-up.  He felt better for a day but the pain returned and has gotten worse.  He came back to the emergency department today and laboratory studies show white blood cell count 14,900.  CT scan of the abdomen and pelvis is now consistent with appendicitis. ? ?Pt admitted with acute appendicitis and appendiceal mass.  ? ?4/15- s/p Procedure: Laparoscopic converted to open ileocecectomy with ileocolic anastomosis ?4/16- NGT placed for decompression ?4/19- pathology reveals appendiceal adenoca (pT4) with tumor deposits in mesentery and 2/20 nodes positive ?4/20- NGT clamped, advanced to clear liquid diet ?4/21- NGT for decompression; PICC and TPN initiated secondary to ileus ? ?Reviewed I/O's: +3.3 L x 24 hours and +4.4 L since admission ? ?NGT output: 1.1 L x 24 hours ?  ?Pt unavailable at time of visit. Attempted to speak with pt via call to hospital room phone, however, unable to reach. RD unable to obtain further nutrition-related history or complete nutrition-focused physical exam at this time.   ? ?Pt complains of bloating and needs IV medication for pain control. NGT placed back to low intermittent suction.  ? ?Per pharmacy, plan to initiate TPN today at 50 ml/hr, which provides 1212 kcals and 60 grams protein, which  provides 54% of estimated kcal needs and 43% of estimated protein needs.  ? ?Reviewed wt hx; wt has been stable over the past 10 months.  ? ?Medications reviewed and include folic acid, ativan, magnesium sulfate, and robaxin.  ? ?Lab Results  ?Component Value Date  ? HGBA1C 5.6 01/12/2020  ? PTA DM medications are none.  ? ?Labs reviewed: CBGS: 106 (inpatient orders for glycemic control are 0-6 units insulin aspart every 6 hours).   ? ?Diet Order:   ?Diet Order   ? ?       ?  Diet NPO time specified Except for: Ice Chips  Diet effective now       ?  ? ?  ?  ? ?  ? ? ?EDUCATION NEEDS:  ? ?No education needs have been identified at this time ? ?Skin:  Skin Assessment: Skin Integrity Issues: ?Skin Integrity Issues:: Incisions ?Incisions: closed abdomen ? ?Last BM:  Unknown ? ?Height:  ? ?Ht Readings from Last 1 Encounters:  ?11/15/21 '5\' 10"'$  (1.778 m)  ? ? ?Weight:  ? ?Wt Readings from Last 1 Encounters:  ?11/15/21 96.3 kg  ? ? ?Ideal Body Weight:  75.5 kg ? ?BMI:  Body mass index is 30.45 kg/m?. ? ?Estimated Nutritional Needs:  ? ?Kcal:  2250-2450 ? ?Protein:  115-130 grams ? ?Fluid:  > 2 L ? ? ? ?Loistine Chance, RD, LDN, CDCES ?Registered Dietitian II ?Certified Diabetes Care and Education Specialist ?Please refer to [facility] for RD and/or RD on-call/weekend/after hours pager  ?

## 2021-11-22 LAB — COMPREHENSIVE METABOLIC PANEL
ALT: 35 U/L (ref 0–44)
AST: 25 U/L (ref 15–41)
Albumin: 2.7 g/dL — ABNORMAL LOW (ref 3.5–5.0)
Alkaline Phosphatase: 59 U/L (ref 38–126)
Anion gap: 8 (ref 5–15)
BUN: 11 mg/dL (ref 8–23)
CO2: 23 mmol/L (ref 22–32)
Calcium: 8.3 mg/dL — ABNORMAL LOW (ref 8.9–10.3)
Chloride: 102 mmol/L (ref 98–111)
Creatinine, Ser: 0.75 mg/dL (ref 0.61–1.24)
GFR, Estimated: >60 mL/min (ref >60)
Glucose, Bld: 124 mg/dL — ABNORMAL HIGH (ref 70–99)
Potassium: 3.7 mmol/L (ref 3.5–5.1)
Sodium: 133 mmol/L — ABNORMAL LOW (ref 135–145)
Total Bilirubin: 1.5 mg/dL — ABNORMAL HIGH (ref 0.3–1.2)
Total Protein: 6 g/dL — ABNORMAL LOW (ref 6.5–8.1)

## 2021-11-22 LAB — GLUCOSE, CAPILLARY
Glucose-Capillary: 121 mg/dL — ABNORMAL HIGH (ref 70–99)
Glucose-Capillary: 138 mg/dL — ABNORMAL HIGH (ref 70–99)
Glucose-Capillary: 139 mg/dL — ABNORMAL HIGH (ref 70–99)
Glucose-Capillary: 150 mg/dL — ABNORMAL HIGH (ref 70–99)

## 2021-11-22 LAB — PHOSPHORUS: Phosphorus: 3.2 mg/dL (ref 2.5–4.6)

## 2021-11-22 LAB — TRIGLYCERIDES: Triglycerides: 177 mg/dL — ABNORMAL HIGH (ref <150)

## 2021-11-22 LAB — MAGNESIUM: Magnesium: 2 mg/dL (ref 1.7–2.4)

## 2021-11-22 MED ORDER — POTASSIUM CHLORIDE IN NACL 20-0.9 MEQ/L-% IV SOLN
INTRAVENOUS | Status: AC
  Filled 2021-11-22: qty 1000

## 2021-11-22 MED ORDER — TRAVASOL 10 % IV SOLN
INTRAVENOUS | Status: AC
  Filled 2021-11-22: qty 1185.6

## 2021-11-22 MED ORDER — POTASSIUM CHLORIDE 10 MEQ/50ML IV SOLN
10.0000 meq | INTRAVENOUS | Status: AC
  Administered 2021-11-22 (×4): 10 meq via INTRAVENOUS
  Filled 2021-11-22 (×4): qty 50

## 2021-11-22 NOTE — Progress Notes (Signed)
Mobility Specialist Progress Note: ? ? 11/22/21 1054  ?Mobility  ?Activity Ambulated with assistance in hallway  ?Level of Assistance Modified independent, requires aide device or extra time  ?Assistive Device None  ?Distance Ambulated (ft) 570 ft  ?Activity Response Tolerated well  ?$Mobility charge 1 Mobility  ? ?Pt received in chair willing to participate in mobility. Complaints of stomach pain. Left in chair with call bell in reach and all needs met.  ? ?Patrick Bailey ?Mobility Specialist ?Primary Phone [phone] ? ?

## 2021-11-22 NOTE — Progress Notes (Addendum)
7 Days Post-Op  ? ?Subjective/Chief Complaint: ?Patient doing well.  Has had bowel movement and flatus. ?Patient still with some nausea. ?Pain is controlled. ? ? ?Objective: ?Vital signs in last 24 hours: ?Temp:  [98 ?F (36.7 ?C)-98.6 ?F (37 ?C)] 98.6 ?F (37 ?C) (04/22 0736) ?Pulse Rate:  [77-106] 77 (04/22 0736) ?Resp:  [16-18] 16 (04/22 0736) ?BP: (107-149)/(63-83) 107/63 (04/22 0736) ?SpO2:  [94 %-96 %] 96 % (04/22 0736) ?Last BM Date :  (PTA) ? ?Intake/Output from previous day: ?04/21 0701 - 04/22 0700 ?In: 2461.3 [I.V.:2311.3; IV Piggyback:150] ?Out: 650 [Emesis/NG output:650] ?Intake/Output this shift: ?No intake/output data recorded. ? ?General appearance: alert and cooperative ?GI: Abdomen distended, minimal tenderness to palpation, midline incision is clean dry and intact. ? ?Lab Results:  ?Recent Labs  ?  11/21/21 ?1107  ?WBC 2.8*  ?HGB 11.9*  ?HCT 35.7*  ?PLT 330  ? ?BMET ?Recent Labs  ?  11/21/21 ?1107 11/22/21 ?0435  ?NA 135 133*  ?K 4.1 3.7  ?CL 105 102  ?CO2 23 23  ?GLUCOSE 94 124*  ?BUN 13 11  ?CREATININE 0.89 0.75  ?CALCIUM 8.7* 8.3*  ? ?PT/INR ?No results for input(s): LABPROT, INR in the last 72 hours. ?ABG ?No results for input(s): PHART, HCO3 in the last 72 hours. ? ?Invalid input(s): PCO2, PO2 ? ?Studies/Results: ?CT CHEST W CONTRAST ? ?Result Date: 11/21/2021 ?CLINICAL DATA:  Staging, metastatic colon cancer. EXAM: CT CHEST WITH CONTRAST TECHNIQUE: Multidetector CT imaging of the chest was performed during intravenous contrast administration. RADIATION DOSE REDUCTION: This exam was performed according to the departmental dose-optimization program which includes automated exposure control, adjustment of the mA and/or kV according to patient size and/or use of iterative reconstruction technique. CONTRAST:  2m OMNIPAQUE IOHEXOL 300 MG/ML  SOLN COMPARISON:  CT abdomen and pelvis 11/14/2021 abdominal x-ray 11/18/2021. FINDINGS: Cardiovascular: Heart is mildly enlarged. Aorta is normal in size.  There is no pericardial effusion. There are atherosclerotic calcifications of the aorta and coronary arteries. Mediastinum/Nodes: No enlarged mediastinal, hilar, or axillary lymph nodes. Thyroid gland, trachea, and esophagus demonstrate no significant findings. Lungs/Pleura: There are mild paraseptal emphysematous changes in the lung apices with some biapical scarring. There is minimal dependent atelectasis in the bilateral lower lobes and minimal atelectasis in the lingula. No pulmonary nodules are seen. No pleural effusion or pneumothorax. Upper Abdomen: Enteric tube extends into the stomach. Dilated small bowel loop in the left upper quadrant measures 4.5 cm as seen on most recent comparison x-ray. Musculoskeletal: No chest wall abnormality. No acute or significant osseous findings. IMPRESSION: 1. No evidence for metastatic disease in the chest. 2. No acute cardiopulmonary process. 3. Stable dilated small bowel loops which may represent small-bowel obstruction or ileus. 4. Mild cardiomegaly. 5. Aortic Atherosclerosis (ICD10-I70.0) and Emphysema (ICD10-J43.9). Electronically Signed   By: ARonney AstersM.D.   On: 11/21/2021 01:48  ? ?DG Abd Portable 1V ? ?Result Date: 11/21/2021 ?CLINICAL DATA:  61year old male with history of postoperative ileus. EXAM: PORTABLE ABDOMEN - 1 VIEW COMPARISON:  11/18/2021 FINDINGS: Similar appearing gaseous distension of multiple loops of small bowel. Relative paucity of colonic gas. Gastric decompression tube remains coiled just beyond the the gastroesophageal junction, no evidence of gaseous distension of the stomach. Surgical clips in the left upper quadrant. Previously administered intravenous contrast within the urinary bladder. IMPRESSION: 1. Similar appearing ileus versus small-bowel obstruction. 2. Unchanged position of gastric decompression tube, coil near the gastric fundus. Electronically Signed   By: DGlade NurseD.  On: 11/21/2021 11:22  ? ?[place] EKG SITE RITE ? ?Result  Date: 11/21/2021 ?If [employer] not attached, placement could not be confirmed due to current cardiac rhythm.  ? ?Anti-infectives: ?Anti-infectives (From admission, onward)  ? ? Start     Dose/Rate Route Frequency Ordered Stop  ? 11/14/21 2330  cefTRIAXone (ROCEPHIN) 2 g in sodium chloride 0.9 % 100 mL IVPB       ?See Hyperspace for full Linked Orders Report.  ? 2 g ?200 mL/hr over 30 Minutes Intravenous  Once 11/14/21 2328 11/15/21 0112  ? 11/14/21 2330  metroNIDAZOLE (FLAGYL) IVPB 500 mg       ?See Hyperspace for full Linked Orders Report.  ? 500 mg ?100 mL/hr over 60 Minutes Intravenous  Once 11/14/21 2328 11/15/21 0221  ? ?  ? ? ?Assessment/Plan: ?POD 7 s/p lap converted to open right hemicolectomy Dr. Zenia Resides 4/15 ?- having bowel function but still with intermittent bloating abdominal pain.  ?-Cont NGT for now and NPO ?- Path with appendiceal adenoca (pT4) with tumor deposits in mesentery and 2/20 nodes positive, discussed with patient -  will follow up with Dr. Benay Spice outpatient ?- CT chest without evidence of metastatic disease. Does continue to show dilated small bowel loops on scout film ?- Pain control: decreased morphine 4>2 and added scheduled robaxin. ?- Mobilize ?- check labs this am ?  ?FEN - NGT LIWS. IVF NaCl with Kcl @ 100 ml/hr, con't TNA ?VTE - SCDs, lovenox, holding plavix ?ID - Rocephin/Flagyl periop, none currently ?Foley - Foley out, voiding ?  ?Hx hypothyroidism - restart home meds ?Hx HTN - prn meds ?Alcohol use - initially reports drinks 6 pack day. Sometimes 8+ beers. Today he states he has not done that for a while but instead drinks 6-8 beers on weekends only. No history of withdrawal. CIWA protocol expiring. Will not reorder ?Hx CVA on plavix - plavix on hold in setting of ileus ? LOS: 6 days  ? ? ?Ralene Ok ?11/22/2021 ? ?

## 2021-11-22 NOTE — Progress Notes (Signed)
PHARMACY - TOTAL PARENTERAL NUTRITION CONSULT NOTE  ? ?Indication: Prolonged ileus ? ?Patient Measurements: ?Height: '5\' 10"'$  (177.8 cm) ?Weight: 96.3 kg (212 lb 4 oz) ?IBW/kg (Calculated) : 73 ?TPN AdjBW (KG): 78.8 ?Body mass index is 30.45 kg/m?. ?Usual Weight: ~93 kg ? ?Assessment:  ?61 y.o. M s/p lap converted to open R hemicolectomy on 4/15. Pt continues with bloating/abd pain. Xray 4/21 shows ileus vs partial SBO. Pt is NPO with NGT to suction. ? ?Noted pt with h/o ETOH use. 6-8 beers on weekends only per pt (initially reported 6 beers/day). No withdrawal noted. On CIWA protocol. ? ?Glucose / Insulin: No h/o DM. CBG <150. No SSI required. ?Electrolytes: Na 133, Mg 2 (goal >/=2), K 3.7 (goal >/=4), coCa 9.3, other lytes wnl.  ?Renal: SCr ~1, BUN wnl. ?Hepatic: Last LFTs 4/14 wnl. TG 177 ?Intake / Output; MIVF: NGT 850 yesterday. UOP not charted. ?MIVF: NS with 46mq KCL at 515mhr ?GI Imaging: ?Abd xray 4/16 small bowel distention, likely post-op ileus ?Abd xray 4/18 Mildly progressive small bowel distension which may reflect ileus or obstruction ?Abd xray 4/21 similar appearing ileus vs SBO ?GI Surgeries / Procedures:  ?4/15 lap converted to open R hemicolectomy  ? ?Central access: PICC placed 4/21 ?TPN start date: 11/21/21 ? ?RD Assessment: ?Estimated Needs ?Total Energy Estimated Needs: [phone] Protein Estimated Needs: 115-130 grams ?Total Fluid Estimated Needs: > 2 L ? ?Current Nutrition:  ?NPO, TPN ? ?Plan:  ?Increase TPN to goal rate of 95 mL/hr at 1800 to provide 118 gm protein and 2285 kcal.  ?KCl 1063mx 4 runs ?Electrolytes in TPN: Increase Na to 100m6m. Continue K 50mE63m Ca 5mEq/41mMg 5mEq/L61mnd Phos 15mmol/34ml:Ac 1:1 ?Add standard MVI and trace elements to TPN ?Add thiamine IV to TPN x 5 days through 4/25 (h/o ETOH use and refeeding risk) ?Sensitive q6h SSI and adjust as needed  ?Reduce MIVF to KVO at 1800 ?Monitor TPN labs daily until stable then on Mon/Thurs ? ?Jeania Nater AmSherlon Handing,  BCPS ?Please see amion for complete clinical pharmacist phone list ?11/22/2021,9:45 AM ? ?

## 2021-11-23 LAB — BASIC METABOLIC PANEL
Anion gap: 11 (ref 5–15)
BUN: 14 mg/dL (ref 8–23)
CO2: 22 mmol/L (ref 22–32)
Calcium: 9.1 mg/dL (ref 8.9–10.3)
Chloride: 101 mmol/L (ref 98–111)
Creatinine, Ser: 0.74 mg/dL (ref 0.61–1.24)
GFR, Estimated: >60 mL/min (ref >60)
Glucose, Bld: 129 mg/dL — ABNORMAL HIGH (ref 70–99)
Potassium: 4 mmol/L (ref 3.5–5.1)
Sodium: 134 mmol/L — ABNORMAL LOW (ref 135–145)

## 2021-11-23 LAB — GLUCOSE, CAPILLARY
Glucose-Capillary: 140 mg/dL — ABNORMAL HIGH (ref 70–99)
Glucose-Capillary: 147 mg/dL — ABNORMAL HIGH (ref 70–99)
Glucose-Capillary: 151 mg/dL — ABNORMAL HIGH (ref 70–99)
Glucose-Capillary: 172 mg/dL — ABNORMAL HIGH (ref 70–99)

## 2021-11-23 LAB — CREATININE, SERUM
Creatinine, Ser: 0.77 mg/dL (ref 0.61–1.24)
GFR, Estimated: >60 mL/min (ref >60)

## 2021-11-23 LAB — PHOSPHORUS: Phosphorus: 3.4 mg/dL (ref 2.5–4.6)

## 2021-11-23 LAB — MAGNESIUM: Magnesium: 1.9 mg/dL (ref 1.7–2.4)

## 2021-11-23 MED ORDER — NALOXONE HCL 0.4 MG/ML IJ SOLN
INTRAMUSCULAR | Status: AC
  Filled 2021-11-23: qty 1

## 2021-11-23 MED ORDER — TRAVASOL 10 % IV SOLN
INTRAVENOUS | Status: AC
  Filled 2021-11-23: qty 1185.6

## 2021-11-23 MED ORDER — MAGNESIUM SULFATE 2 GM/50ML IV SOLN
2.0000 g | Freq: Once | INTRAVENOUS | Status: AC
  Administered 2021-11-23: 2 g via INTRAVENOUS
  Filled 2021-11-23: qty 50

## 2021-11-23 NOTE — Progress Notes (Signed)
Mobility Specialist Progress Note: ? ? 11/23/21 1200  ?Mobility  ?Activity Ambulated independently in hallway  ?Level of Assistance Independent  ?Assistive Device  ?(IV pole)  ?Distance Ambulated (ft) 570 ft  ?Activity Response Tolerated well  ?$Mobility charge 1 Mobility  ? ?Patrick Bailey ?Mobility Specialist ?Primary Phone [phone] ? ?

## 2021-11-23 NOTE — Progress Notes (Signed)
8 Days Post-Op  ? ?Subjective/Chief Complaint: ?Patient doing well today.  Less nausea, less bloated ?Having bowel movements. ? ? ?Objective: ?Vital signs in last 24 hours: ?Temp:  [97.9 ?F (36.6 ?C)-98.2 ?F (36.8 ?C)] 98 ?F (36.7 ?C) (04/23 0615) ?Pulse Rate:  [86-110] 86 (04/23 0615) ?Resp:  [16-20] 20 (04/23 0615) ?BP: (126-142)/(82-90) 126/82 (04/23 0615) ?SpO2:  [94 %-99 %] 94 % (04/23 0615) ?Last BM Date : 11/22/21 ? ?Intake/Output from previous day: ?04/22 0701 - 04/23 0700 ?In: 1360.4 [I.V.:1260.4; IV Piggyback:100] ?Out: 100 [Emesis/NG output:100] ?Intake/Output this shift: ?No intake/output data recorded. ? ?General appearance: alert and cooperative ?GI: Soft, nondistended, incision clean dry and intact. ? ?Lab Results:  ?Recent Labs  ?  11/21/21 ?1107  ?WBC 2.8*  ?HGB 11.9*  ?HCT 35.7*  ?PLT 330  ? ?BMET ?Recent Labs  ?  11/21/21 ?1107 11/22/21 ?0435 11/23/21 ?0436  ?NA 135 133*  --   ?K 4.1 3.7  --   ?CL 105 102  --   ?CO2 23 23  --   ?GLUCOSE 94 124*  --   ?BUN 13 11  --   ?CREATININE 0.89 0.75 0.77  ?CALCIUM 8.7* 8.3*  --   ? ?PT/INR ?No results for input(s): LABPROT, INR in the last 72 hours. ?ABG ?No results for input(s): PHART, HCO3 in the last 72 hours. ? ?Invalid input(s): PCO2, PO2 ? ?Studies/Results: ?DG Abd Portable 1V ? ?Result Date: 11/21/2021 ?CLINICAL DATA:  61 year old male with history of postoperative ileus. EXAM: PORTABLE ABDOMEN - 1 VIEW COMPARISON:  11/18/2021 FINDINGS: Similar appearing gaseous distension of multiple loops of small bowel. Relative paucity of colonic gas. Gastric decompression tube remains coiled just beyond the the gastroesophageal junction, no evidence of gaseous distension of the stomach. Surgical clips in the left upper quadrant. Previously administered intravenous contrast within the urinary bladder. IMPRESSION: 1. Similar appearing ileus versus small-bowel obstruction. 2. Unchanged position of gastric decompression tube, coil near the gastric fundus.  Electronically Signed   By: Ruthann Cancer M.D.   On: 11/21/2021 11:22  ? ?[place] EKG SITE RITE ? ?Result Date: 11/21/2021 ?If [employer] not attached, placement could not be confirmed due to current cardiac rhythm.  ? ?Anti-infectives: ?Anti-infectives (From admission, onward)  ? ? Start     Dose/Rate Route Frequency Ordered Stop  ? 11/14/21 2330  cefTRIAXone (ROCEPHIN) 2 g in sodium chloride 0.9 % 100 mL IVPB       ?See Hyperspace for full Linked Orders Report.  ? 2 g ?200 mL/hr over 30 Minutes Intravenous  Once 11/14/21 2328 11/15/21 0112  ? 11/14/21 2330  metroNIDAZOLE (FLAGYL) IVPB 500 mg       ?See Hyperspace for full Linked Orders Report.  ? 500 mg ?100 mL/hr over 60 Minutes Intravenous  Once 11/14/21 2328 11/15/21 0221  ? ?  ? ? ?Assessment/Plan: ?POD 8 s/p lap converted to open right hemicolectomy Dr. Zenia Resides 4/15 ?- having bowel function but still with intermittent bloating abdominal pain.  ?-We will clamp NG tube x6 hours.  If no nausea vomit that time can DC and start liquids. 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 Benay Spice outpatient ?- CT chest without evidence of metastatic disease. Does continue to show dilated small bowel loops on scout film ?- Pain control: decreased morphine 4>2 and added scheduled robaxin. ?- Mobilize ?- check labs this am ?  ?FEN - NGT  clamping trials. IVF NaCl with Kcl @ 100 ml/hr, con't TNA ?VTE - SCDs, lovenox, holding plavix ?ID - Rocephin/Flagyl periop, none currently ?Foley - Foley out, voiding ?  ?Hx hypothyroidism - restart home meds ?Hx HTN - prn meds ?Alcohol use - initially reports drinks 6 pack day. Sometimes 8+ beers. Today he states he has not done that for a while but instead drinks 6-8 beers on weekends only. No history of withdrawal. CIWA protocol expiring. Will not reorder ?Hx CVA on plavix - plavix on hold in setting of ileus ? LOS: 7 days  ? ? ?Patrick Bailey ?11/23/2021 ? ?

## 2021-11-23 NOTE — Progress Notes (Signed)
PHARMACY - TOTAL PARENTERAL NUTRITION CONSULT NOTE  ? ?Indication: Prolonged ileus ? ?Patient Measurements: ?Height: '5\' 10"'$  (177.8 cm) ?Weight: 96.3 kg (212 lb 4 oz) ?IBW/kg (Calculated) : 73 ?TPN AdjBW (KG): 78.8 ?Body mass index is 30.45 kg/m?. ?Usual Weight: ~93 kg ? ?Assessment:  ?61 y.o. M s/p lap converted to open R hemicolectomy on 4/15. Pt continues with bloating/abd pain. Xray 4/21 shows ileus vs partial SBO. Pt is NPO with NGT to suction. ? ?Noted pt with h/o ETOH use. 6-8 beers on weekends only per pt (initially reported 6 beers/day). No withdrawal noted. On CIWA protocol. ? ?Pt with less nausea/bloating today. Also having BM. Plan to clamp NGT today. ? ?Glucose / Insulin: No h/o DM. CBG <180. No SSI required. ?Electrolytes: Na 134, Mg 1.9 (goal >/=2), K 4 (goal >/=4), coCa 10.1, other lytes wnl.  ?Renal: SCr ~1, BUN wnl. ?Hepatic: Last LFTs 4/14 wnl. TG 177 ?Intake / Output; MIVF: NGT 650 yesterday. UOP not charted. LBM 4/23. ?MIVF: NS with 76mq KCL at 193mhr ?GI Imaging: ?Abd xray 4/16 small bowel distention, likely post-op ileus ?Abd xray 4/18 Mildly progressive small bowel distension which may reflect ileus or obstruction ?Abd xray 4/21 similar appearing ileus vs SBO ?GI Surgeries / Procedures:  ?4/15 lap converted to open R hemicolectomy  ? ?Central access: PICC placed 4/21 ?TPN start date: 11/21/21 ? ?RD Assessment: ?Estimated Needs ?Total Energy Estimated Needs: 22302 [id number] 3939Total Protein Estimated Needs: 115-130 grams ?Total Fluid Estimated Needs: > 2 L ? ?Current Nutrition:  ?NPO, TPN ? ?Plan:  ?Continue TPN at goal rate of 95 mL/hr at 1800 to provide 118 gm protein and 2285 kcal.  ?Magnesium sulfate 2gm IV now ?Electrolytes in TPN: Increase Mg to 8 meq/L. Continue Na 10052mL, K 54m56m, Ca 5mEq28m and Phos 15mmo41m Cl:Ac 1:1 ?Add standard MVI and trace elements to TPN ?Add thiamine IV to TPN x 5 days through 4/25 (h/o ETOH use and refeeding risk) ?Sensitive q6h SSI and adjust as needed   ?Monitor TPN labs on Mon/Thurs ? ?Reve Crocket Sherlon HandingmD, BCPS ?Please see amion for complete clinical pharmacist phone list ?11/23/2021,8:07 AM ? ?

## 2021-11-23 NOTE — Plan of Care (Signed)
Patient did not report any pain during shift. He is still passing lots of gas minium output of NG tube.  ?Problem: Nutrition: ?Goal: Adequate nutrition will be maintained ?11/23/2021 0556 by Tilden Fossa T ?Outcome: Progressing ?11/23/2021 0556 by Kirtland Bouchard, RN ?Outcome: Progressing ?  ?Problem: Activity: ?Goal: Risk for activity intolerance will decrease ?11/23/2021 0556 by Kirtland Bouchard, RN ?Outcome: Progressing ?11/23/2021 0556 by Kirtland Bouchard, RN ?Outcome: Progressing ?  ?Problem: Pain Managment: ?Goal: General experience of comfort will improve ?11/23/2021 0556 by Kirtland Bouchard, RN ?Outcome: Progressing ?11/23/2021 0556 by Kirtland Bouchard, RN ?Outcome: Progressing ?  ?

## 2021-11-24 LAB — COMPREHENSIVE METABOLIC PANEL
ALT: 34 U/L (ref 0–44)
AST: 24 U/L (ref 15–41)
Albumin: 2.8 g/dL — ABNORMAL LOW (ref 3.5–5.0)
Alkaline Phosphatase: 73 U/L (ref 38–126)
Anion gap: 6 (ref 5–15)
BUN: 16 mg/dL (ref 8–23)
CO2: 23 mmol/L (ref 22–32)
Calcium: 8.6 mg/dL — ABNORMAL LOW (ref 8.9–10.3)
Chloride: 106 mmol/L (ref 98–111)
Creatinine, Ser: 0.71 mg/dL (ref 0.61–1.24)
GFR, Estimated: >60 mL/min (ref >60)
Glucose, Bld: 129 mg/dL — ABNORMAL HIGH (ref 70–99)
Potassium: 4.2 mmol/L (ref 3.5–5.1)
Sodium: 135 mmol/L (ref 135–145)
Total Bilirubin: 0.6 mg/dL (ref 0.3–1.2)
Total Protein: 6.1 g/dL — ABNORMAL LOW (ref 6.5–8.1)

## 2021-11-24 LAB — GLUCOSE, CAPILLARY
Glucose-Capillary: 107 mg/dL — ABNORMAL HIGH (ref 70–99)
Glucose-Capillary: 130 mg/dL — ABNORMAL HIGH (ref 70–99)
Glucose-Capillary: 132 mg/dL — ABNORMAL HIGH (ref 70–99)
Glucose-Capillary: 157 mg/dL — ABNORMAL HIGH (ref 70–99)

## 2021-11-24 LAB — MAGNESIUM: Magnesium: 2 mg/dL (ref 1.7–2.4)

## 2021-11-24 LAB — TRIGLYCERIDES: Triglycerides: 178 mg/dL — ABNORMAL HIGH (ref <150)

## 2021-11-24 LAB — PHOSPHORUS: Phosphorus: 3.7 mg/dL (ref 2.5–4.6)

## 2021-11-24 MED ORDER — METHOCARBAMOL 500 MG PO TABS: 1000.0000 mg | ORAL_TABLET | Freq: Four times a day (QID) | ORAL | Status: DC | PRN

## 2021-11-24 MED ORDER — ACETAMINOPHEN 325 MG PO TABS
650.0000 mg | ORAL_TABLET | Freq: Four times a day (QID) | ORAL | Status: DC
  Administered 2021-11-24 – 2021-11-25 (×5): 650 mg via ORAL
  Filled 2021-11-24 (×4): qty 2

## 2021-11-24 MED ORDER — THIAMINE HCL 100 MG PO TABS
100.0000 mg | ORAL_TABLET | Freq: Every day | ORAL | Status: DC
  Administered 2021-11-25: 100 mg via ORAL
  Filled 2021-11-24: qty 1

## 2021-11-24 MED ORDER — TRAVASOL 10 % IV SOLN
INTRAVENOUS | Status: DC
  Filled 2021-11-24: qty 624

## 2021-11-24 MED ORDER — TRAMADOL HCL 50 MG PO TABS
50.0000 mg | ORAL_TABLET | Freq: Four times a day (QID) | ORAL | Status: DC | PRN
Start: 2021-11-24 — End: 2021-11-25
  Administered 2021-11-24 – 2021-11-25 (×2): 50 mg via ORAL
  Filled 2021-11-24 (×2): qty 1

## 2021-11-24 NOTE — Progress Notes (Signed)
9 Days Post-Op  ? ?Subjective/Chief Complaint: ?Patient doing well today. Having flatus and BMs. Still w/ some belching. Feels less distended. He is mobilizing. Tolerated NG tube clamped all day yesterday but is nervous about removing it. Wants to try clears for breakfast and lunch and then remove. 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 ? ? ? ?Objective: ?Vital signs in last 24 hours: ?Temp:  [98 ?F (36.7 ?C)-98.4 ?F (36.9 ?C)] 98.4 ?F (36.9 ?C) (04/24 4481) ?Pulse Rate:  [85-105] 85 (04/24 8563) ?Resp:  [18-20] 20 (04/24 1497) ?BP: (118-133)/(77-85) 118/78 (04/24 0263) ?SpO2:  [94 %-96 %] 94 % (04/24 0628) ?Last BM Date : 11/23/21 ? ?Intake/Output from previous day: ?04/23 0701 - 04/24 0700 ?In: 2491.5 [I.V.:2280; IV Piggyback:211.4] ?Out: 0  ?Intake/Output this shift: ?No intake/output data recorded. ? ?General appearance: alert and cooperative ?GI: Soft, nondistended, incision clean dry and intact.  W staples ? ?Lab Results:  ?Recent Labs  ?  11/21/21 ?1107  ?WBC 2.8*  ?HGB 11.9*  ?HCT 35.7*  ?PLT 330  ? ?BMET ?Recent Labs  ?  11/23/21 ?1004 11/24/21 ?0414  ?NA 134* 135  ?K 4.0 4.2  ?CL 101 106  ?CO2 22 23  ?GLUCOSE 129* 129*  ?BUN 14 16  ?CREATININE 0.74 0.71  ?CALCIUM 9.1 8.6*  ? ?PT/INR ?No results for input(s): LABPROT, INR in the last 72 hours. ?ABG ?No results for input(s): PHART, HCO3 in the last 72 hours. ? ?Invalid input(s): PCO2, PO2 ? ?Studies/Results: ?No results found. ? ?Anti-infectives: ?Anti-infectives (From admission, onward)  ? ? Start     Dose/Rate Route Frequency Ordered Stop  ? 11/14/21 2330  cefTRIAXone (ROCEPHIN) 2 g in sodium chloride 0.9 % 100 mL IVPB       ?See Hyperspace for full Linked Orders Report.  ? 2 g ?200 mL/hr over 30 Minutes Intravenous  Once 11/14/21 2328 11/15/21 0112  ? 11/14/21 2330  metroNIDAZOLE (FLAGYL) IVPB 500 mg       ?See Hyperspace for full Linked Orders Report.  ? 500 mg ?100 mL/hr over 60 Minutes Intravenous  Once  11/14/21 2328 11/15/21 0221  ? ?  ? ? ?Assessment/Plan: ?POD 9 s/p lap converted to open right hemicolectomy Dr. Zenia Resides 4/15 ?- having bowel function but still with intermittent belching, less distention. Nausea resolved. ?- D/C NG tube and advance diet as tolerated. Discussed with patient and he prefers to keep NG in place, clamped, until lunch time today. Plan to D/C NGT by 1pm. Wean TPN and plan to stop after tonights bag. 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 Benay Spice outpatient ?- CT chest without evidence of metastatic disease. Does continue to show dilated small bowel loops on scout film ?- Pain control: improving, he is not using morphine anymore. Switch to PO pain control ?- Mobilize ?- check labs this am ?  ?FEN - D/C NGT, CLD, FLD at dinner time. Wean TPN per pharmacy - discussed w/ pharmacy ?VTE - SCDs, lovenox, holding plavix ?ID - Rocephin/Flagyl periop, none currently ?Foley - Foley out, voiding ?  ?Hx hypothyroidism - restart home meds ?Hx HTN - prn meds ?Alcohol use - initially reports drinks 6 pack day. Sometimes 8+ beers. states he has not done that for a while but instead drinks 6-8 beers on weekends only. No history of withdrawal. CIWA protocol expiring. ?Hx CVA on plavix - plavix on hold  in setting of ileus; CBC in AM, resume plavix tomorrow. ? ? LOS: 8 days  ? ? ?[place] ?11/24/2021 ? ?

## 2021-11-24 NOTE — Progress Notes (Signed)
Mobility Specialist Progress Note: ? ? 11/24/21 0954  ?Mobility  ?Activity Ambulated with assistance in hallway  ?Level of Assistance Independent  ?Distance Ambulated (ft) 1120 ft  ?Activity Response Tolerated well  ?$Mobility charge 1 Mobility  ? ?Pt received coming out of bathroom. No complaints of pain. Left in chair with call bell in reach and all needs met.  ? ?Wilberth Damon ?Mobility Specialist ?Primary Phone [phone] ? ?

## 2021-11-24 NOTE — Progress Notes (Signed)
PHARMACY - TOTAL PARENTERAL NUTRITION CONSULT NOTE  ? ?Indication: Prolonged ileus ? ?Patient Measurements: ?Height: '5\' 10"'$  (177.8 cm) ?Weight: 96.3 kg (212 lb 4 oz) ?IBW/kg (Calculated) : 73 ?TPN AdjBW (KG): 78.8 ?Body mass index is 30.45 kg/m?. ?Usual Weight: ~93 kg ? ?Assessment:  ?61 y.o. M s/p lap converted to open R hemicolectomy on 4/15. Pt continues with bloating/abd pain. Xray 4/21 shows ileus vs partial SBO. Pt is NPO with NGT to suction. ? ?Noted pt with h/o ETOH use. 6-8 beers on weekends only per pt (initially reported 6 beers/day). No withdrawal noted. On CIWA protocol. ? ?Pt tolerating clamped NGT - nervous about having it removed. Plan to try clears for breakfast and lunch then remove NGT and fulls for dinner then advance diet as tolerated.  ? ?Glucose / Insulin: No h/o DM. CBG <180. No SSI required. ?Electrolytes: Mg 2 (goal >/=2), K 4.2 (goal >/=4), coCa 9.6, other lytes wnl.  ?Renal: SCr ~1, BUN wnl. ?Hepatic: LFTs wnl. TG 178 ?Intake / Output; MIVF: NGT 100 yesterday - clamped and to be d/c later today. UOP not charted. LBM 4/23. ?MIVF: NS with 47mq KCL at 179mhr ?GI Imaging: ?Abd xray 4/16 small bowel distention, likely post-op ileus ?Abd xray 4/18 Mildly progressive small bowel distension which may reflect ileus or obstruction ?Abd xray 4/21 similar appearing ileus vs SBO ?GI Surgeries / Procedures:  ?4/15 lap converted to open R hemicolectomy  ? ?Central access: PICC placed 4/21 ?TPN start date: 11/21/21 ? ?RD Assessment: ?Estimated Needs ?Total Energy Estimated Needs: [phone] Protein Estimated Needs: 115-130 grams ?Total Fluid Estimated Needs: > 2 L ? ?Current Nutrition:  ?TPN, Clears for breakfast/lunch then to fulls and advance as tolerated ? ?Plan:  ?Decrease TPN to ~1/2 goal rate at 50 mL/hr when new bag hangs at 1800 to provide 62 gm protein and 1200 kcal.  ?Electrolytes in TPN: Continue Na 10017mL, K 28m4m, Ca 5mEq26m Mg 8 meq/L, and Phos 15mmo66m Cl:Ac 1:1 ?Add standard  MVI and trace elements to TPN ?Change extra thiamine in TPN (ETOH use) back to po tablet ?Sensitive q6h SSI and adjust as needed  ?Monitor TPN labs on Mon/Thurs ?Discussed with ElizabObie DredgePA and will wean TPN to ~1/2 rate tonight with plans to d/c tomorrow as long as pt tolerating diet. ? ?Mutasim Tuckey Sherlon HandingmD, BCPS ?Please see amion for complete clinical pharmacist phone list ?11/24/2021,9:24 AM ? ?

## 2021-11-24 NOTE — Plan of Care (Signed)

## 2021-11-25 LAB — CBC
HCT: 37 % — ABNORMAL LOW (ref 39.0–52.0)
Hemoglobin: 11.7 g/dL — ABNORMAL LOW (ref 13.0–17.0)
MCH: 32.4 pg (ref 26.0–34.0)
MCHC: 31.6 g/dL (ref 30.0–36.0)
MCV: 102.5 fL — ABNORMAL HIGH (ref 80.0–100.0)
Platelets: 445 10*3/uL — ABNORMAL HIGH (ref 150–400)
RBC: 3.61 MIL/uL — ABNORMAL LOW (ref 4.22–5.81)
RDW: 12.9 % (ref 11.5–15.5)
WBC: 7.5 10*3/uL (ref 4.0–10.5)
nRBC: 0 % (ref 0.0–0.2)

## 2021-11-25 LAB — GLUCOSE, CAPILLARY: Glucose-Capillary: 127 mg/dL — ABNORMAL HIGH (ref 70–99)

## 2021-11-25 MED ORDER — ENSURE ENLIVE PO LIQD
237.0000 mL | Freq: Two times a day (BID) | ORAL | Status: DC
  Administered 2021-11-25: 237 mL via ORAL

## 2021-11-25 MED ORDER — PANTOPRAZOLE SODIUM 40 MG PO TBEC
40.0000 mg | DELAYED_RELEASE_TABLET | Freq: Every day | ORAL | Status: DC
Start: 2021-11-26 — End: 2021-11-25

## 2021-11-25 MED ORDER — ACETAMINOPHEN 325 MG PO TABS: 650.0000 mg | ORAL_TABLET | Freq: Four times a day (QID) | ORAL | Status: DC | PRN

## 2021-11-25 NOTE — Progress Notes (Signed)
RN gave patient discharge instructions and he is waiting for his lunch. PICC line has been removed, belongings have been packed. ?

## 2021-11-25 NOTE — Progress Notes (Signed)
Nutrition Follow-up ? ?DOCUMENTATION CODES:  ?Obesity unspecified ? ?INTERVENTION:  ?Continue current diet as tolerated, encourage PO intake ?Ensure Enlive po BID, each supplement provides 350 kcal and 20 grams of protein. ?Allow TPN to expire after current bag ? ?NUTRITION DIAGNOSIS:  ?Increased nutrient needs related to post-op healing as evidenced by estimated needs. ?- remains applicable ? ?GOAL:  ?Patient will meet greater than or equal to 90% of their needs ?- progressing, diet advanced ? ?MONITOR:  ?Diet advancement, Labs, Weight trends, Skin, I & O's ? ?REASON FOR ASSESSMENT:  ?Consult ?New TPN/TNA ? ?ASSESSMENT:  ?61 year old male evaluated by our team 4/11 for right lower quadrant pain.  Imaging it looks consistent with a mucocele of the appendix.  He was discharged with surgical follow-up.  He felt better for a day but the pain returned and has gotten worse.  He came back to the emergency department today and laboratory studies show white blood cell count 14,900.  CT scan of the abdomen and pelvis is now consistent with appendicitis. ? ?4/15- s/p Procedure: Laparoscopic converted to open ileocecectomy with ileocolic anastomosis ?4/16- NGT placed for decompression ?4/19- pathology reveals appendiceal adenoca (pT4) with tumor deposits in mesentery and 2/20 nodes positive ?4/20- NGT clamped, advanced to clear liquid diet ?4/21- NGT for decompression; PICC and TPN initiated secondary to ileus ?4/23 - Clears initiated  ?4/24 - Full Liquids, NGT removed, TPN to half rate ?4/25 - Soft diet ? ?Pt resting in chair at the time of visit. TPN currently infusing at half rate. Pt reports feeling great this AM, tolerating diet, and having bowel function. Pt reports he is about to get up and walk and is hopeful he will be discharged home today.  ? ?Discussed nutrition hx PTA. Pt reports that he has always had a good appetite and has maintained his weight about 215 lbs. Pt did inquire about how to eat whenever he is  discharged to prevent further GI problems. Discussed that there was no diet to follow for appendectomies, but did provide information on GI soft diet to follow for 2 weeks since operation was open.  ? ?Nutritionally Relevant Medications: ?Scheduled Meds: ? folic acid  1 mg Oral Daily  ? insulin aspart  0-6 Units Subcutaneous Q6H  ? pantoprazole IV  40 mg Intravenous Daily  ? thiamine  100 mg Oral Daily  ? ?Continuous Infusions: ? TPN ADULT (ION) 50 mL/hr at 11/25/21 0548  ? ?PRN Meds: calcium carbonate, ondansetron ? ?Labs Reviewed ? ?NUTRITION - FOCUSED PHYSICAL EXAM: ?Flowsheet Row Most Recent Value  ?Orbital Region No depletion  ?Upper Arm Region No depletion  ?Thoracic and Lumbar Region No depletion  ?Buccal Region No depletion  ?Temple Region No depletion  ?Clavicle Bone Region No depletion  ?Clavicle and Acromion Bone Region No depletion  ?Scapular Bone Region No depletion  ?Dorsal Hand No depletion  ?Patellar Region Mild depletion  ?Anterior Thigh Region Mild depletion  ?Posterior Calf Region No depletion  ?Edema (RD Assessment) None  ?Hair Reviewed  ?Eyes Reviewed  ?Mouth Reviewed  ?Skin Reviewed  ?Nails Reviewed  ? ?Diet Order:   ?Diet Order   ? ?       ?  DIET SOFT Room service appropriate? Yes; Fluid consistency: Thin  Diet effective now       ?  ? ?  ?  ? ?  ? ? ?EDUCATION NEEDS:  ?No education needs have been identified at this time ? ?Skin:  Skin Assessment: Skin Integrity Issues: ?Skin Integrity  Issues:: Incisions ?Incisions: closed abdomen ? ?Last BM:  2/23 ? ?Height:  ?Ht Readings from Last 1 Encounters:  ?11/15/21 '5\' 10"'$  (1.778 m)  ? ? ?Weight:  ?Wt Readings from Last 1 Encounters:  ?11/15/21 96.3 kg  ? ? ?Ideal Body Weight:  75.5 kg ? ?BMI:  Body mass index is 30.45 kg/m?. ? ?Estimated Nutritional Needs:  ?Kcal:  2250-2450 ?Protein:  115-130 grams ?Fluid:  > 2 L ? ? ?Ranell Patrick, RD, LDN ?Clinical Dietitian ?RD pager # available in [place]  ?After hours/weekend pager # available in [place] ?

## 2021-11-25 NOTE — Progress Notes (Signed)
PHARMACY - TOTAL PARENTERAL NUTRITION CONSULT NOTE  ? ?Indication: Prolonged ileus ? ?Patient Measurements: ?Height: '5\' 10"'$  (177.8 cm) ?Weight: 96.3 kg (212 lb 4 oz) ?IBW/kg (Calculated) : 73 ?TPN AdjBW (KG): 78.8 ?Body mass index is 30.45 kg/m?. ?Usual Weight: ~93 kg ? ?Assessment:  ?61 y.o. M s/p lap converted to open R hemicolectomy on 4/15. Pt continues with bloating/abd pain. Xray 4/21 shows ileus vs partial SBO. Pt is NPO with NGT to suction. ? ?Noted pt with h/o ETOH use. 6-8 beers on weekends only per pt (initially reported 6 beers/day). No withdrawal noted. On CIWA protocol. ? ?NGT removed 4/24.  ? ?Glucose / Insulin: No h/o DM. CBG < 150. Used 1 unit Insulin   ?Electrolytes: last labs 4/24: Mg 2 (goal >/=2), K 4.2 (goal >/=4), coCa 9.6, other lytes wnl.  ?Renal: SCr 0.7, BUN wnl. ?Hepatic: LFTs wnl. TG 178 ?Intake / Output; MIVF:  UOP not charted. LBM charted 4/23. ?MIVF: NS with 65mq KCL at 1100mhr ?GI Imaging: ?Abd xray 4/16 small bowel distention, likely post-op ileus ?Abd xray 4/18 Mildly progressive small bowel distension which may reflect ileus or obstruction ?Abd xray 4/21 similar appearing ileus vs SBO ?GI Surgeries / Procedures:  ?4/15 lap converted to open R hemicolectomy  ? ?Central access: PICC placed 4/21 ?TPN start date: 11/21/21 ? ?RD Assessment: ?Estimated Needs ?Total Energy Estimated Needs: [phone] Protein Estimated Needs: 115-130 grams ?Total Fluid Estimated Needs: > 2 L ? ?Current Nutrition:  ?TPN, soft diet - tolerating well  ? ?Plan:  ?Continue current bag until done: at 17:00 or 1 hour prior to discharge, taper TPN to 2599mr for 1 hour then stop TPN  ? ?LydBenetta SparharmD, BCPS, BCCP ?Clinical Pharmacist ? ?Please check AMION for all [facility] numbers ?After 10:00 PM, call [phone]?

## 2021-11-25 NOTE — Progress Notes (Signed)
Mobility Specialist Progress Note  ? ? 11/25/21 1010  ?Mobility  ?Activity Ambulated independently in hallway  ?Level of Assistance Independent  ?Assistive Device None  ?Distance Ambulated (ft) 1140 ft  ?Activity Response Tolerated well  ?$Mobility charge 1 Mobility  ? ?Pt received in chair and agreeable. No complaints on walk. Returned to chair with call bell in reach.   ? ?Hildred Alamin ?Mobility Specialist  ?Primary: 5N M.S. Phone: [phone] ?Secondary: 6N M.S. Phone: [phone] ?  ?

## 2021-11-27 LAB — GLUCOSE, CAPILLARY: Glucose-Capillary: 113 mg/dL — ABNORMAL HIGH (ref 70–99)

## 2021-12-01 ENCOUNTER — Encounter: Payer: Self-pay | Admitting: *Deleted

## 2021-12-01 ENCOUNTER — Inpatient Hospital Stay: Payer: BC Managed Care – PPO | Attending: Oncology | Admitting: Oncology

## 2021-12-01 DIAGNOSIS — C181 Malignant neoplasm of appendix: Secondary | ICD-10-CM | POA: Insufficient documentation

## 2021-12-01 DIAGNOSIS — I10 Essential (primary) hypertension: Secondary | ICD-10-CM | POA: Diagnosis not present

## 2021-12-01 DIAGNOSIS — M109 Gout, unspecified: Secondary | ICD-10-CM | POA: Diagnosis not present

## 2021-12-01 DIAGNOSIS — M79605 Pain in left leg: Secondary | ICD-10-CM | POA: Diagnosis not present

## 2021-12-01 DIAGNOSIS — Z85810 Personal history of malignant neoplasm of tongue: Secondary | ICD-10-CM

## 2021-12-01 DIAGNOSIS — Z8673 Personal history of transient ischemic attack (TIA), and cerebral infarction without residual deficits: Secondary | ICD-10-CM

## 2021-12-01 DIAGNOSIS — Z87891 Personal history of nicotine dependence: Secondary | ICD-10-CM

## 2021-12-01 NOTE — Progress Notes (Signed)
PATIENT NAVIGATOR PROGRESS NOTE ? ?Name: Patrick Bailey. ?Date: 12/01/2021 ?MRN: [id number]  ?DOB: 04-Mar-1961 ? ? ?Reason for visit:  ?Initial visit with Dr Benay Spice ? ?Comments:  Met with pt during initial visit with Dr Benay Spice to discuss pathology report, scan review and plan of care ? ?1.Provided pathology report  ?2. Will help to facilitate 2nd opinion at [facility] ?3. IHC and Mismatch repair protein testing ordered from accession number [id number] ?4. Provideed written information on treatment recommendations ? ?Reviewed contact information to call with any questions or issues ? ? ? ?Time spent counseling/coordinating care: > 60 minutes ? ?
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
## 2021-12-04 ENCOUNTER — Other Ambulatory Visit (INDEPENDENT_AMBULATORY_CARE_PROVIDER_SITE_OTHER): Payer: Self-pay | Admitting: Family

## 2021-12-04 DIAGNOSIS — I739 Peripheral vascular disease, unspecified: Secondary | ICD-10-CM

## 2021-12-05 ENCOUNTER — Ambulatory Visit (INDEPENDENT_AMBULATORY_CARE_PROVIDER_SITE_OTHER): Payer: BC Managed Care – PPO

## 2021-12-05 ENCOUNTER — Ambulatory Visit (INDEPENDENT_AMBULATORY_CARE_PROVIDER_SITE_OTHER): Payer: BC Managed Care – PPO | Admitting: Nurse Practitioner

## 2021-12-05 ENCOUNTER — Encounter (INDEPENDENT_AMBULATORY_CARE_PROVIDER_SITE_OTHER): Payer: Self-pay | Admitting: Nurse Practitioner

## 2021-12-05 VITALS — BP 128/87 | HR 94 | Resp 16 | Wt 200.2 lb

## 2021-12-05 DIAGNOSIS — Z87891 Personal history of nicotine dependence: Secondary | ICD-10-CM | POA: Diagnosis not present

## 2021-12-05 DIAGNOSIS — I739 Peripheral vascular disease, unspecified: Secondary | ICD-10-CM

## 2021-12-05 DIAGNOSIS — M4726 Other spondylosis with radiculopathy, lumbar region: Secondary | ICD-10-CM

## 2021-12-08 ENCOUNTER — Ambulatory Visit (INDEPENDENT_AMBULATORY_CARE_PROVIDER_SITE_OTHER)
Admission: RE | Admit: 2021-12-08 | Discharge: 2021-12-08 | Disposition: A | Discharge status: Home / Self Care | Payer: BC Managed Care – PPO | Source: Ambulatory Visit | Attending: Family Medicine | Admitting: Family Medicine

## 2021-12-08 ENCOUNTER — Ambulatory Visit: Payer: BC Managed Care – PPO | Admitting: Family Medicine

## 2021-12-08 ENCOUNTER — Encounter: Payer: Self-pay | Admitting: Family Medicine

## 2021-12-08 ENCOUNTER — Encounter: Payer: Self-pay | Admitting: *Deleted

## 2021-12-08 VITALS — BP 136/84 | HR 71 | Temp 97.5°F | Ht 70.0 in | Wt 203.2 lb

## 2021-12-08 DIAGNOSIS — M79651 Pain in right thigh: Secondary | ICD-10-CM

## 2021-12-08 DIAGNOSIS — C181 Malignant neoplasm of appendix: Secondary | ICD-10-CM

## 2021-12-08 DIAGNOSIS — M79604 Pain in right leg: Secondary | ICD-10-CM | POA: Diagnosis not present

## 2021-12-08 DIAGNOSIS — M1611 Unilateral primary osteoarthritis, right hip: Secondary | ICD-10-CM | POA: Diagnosis not present

## 2021-12-08 MED ORDER — OXYCODONE-ACETAMINOPHEN 5-325 MG PO TABS: 0.5000 | ORAL_TABLET | Freq: Three times a day (TID) | ORAL | 0 refills | Status: DC | PRN

## 2021-12-08 NOTE — Assessment & Plan Note (Addendum)
Point tender to palpation of right posterior proximal thigh.  Describes severe pain present at least for the last several weeks. ?He has been taking high-dose Advil as well as his regular amitriptyline and gabapentin 400/400/800 without much relief. ?With recent cancer diagnosis and point tenderness to bone check femur x-ray rule out pathological fracture. ?In interim will prescribe oxycodone 5/325 #15 to use 3 times daily as needed severe pain as he has an allergy to hydrocodone (hives). ? ?ADDENDUM: X-ray with lucency to posterior femur.  Spoke with radiologist, will order noncontrasted CT of hip, ask to ensure 50% of proximal femur is evaluated.  I spoke with patient and asked him to start using crutches to avoid weightbearing.  He has crutches at home. ?

## 2021-12-08 NOTE — Progress Notes (Signed)
? ? Patient ID: [name]., male    DOB: 1961-05-02, 61 y.o.   MRN: [id number] ? ?This visit was conducted in person. ? ?BP 136/84   Pulse 71   Temp (!) 97.5 ?F (36.4 ?C) (Temporal)   Ht '5\' 10"'$  (1.778 m)   Wt 203 lb 4 oz (92.2 kg)   SpO2 96%   BMI 29.16 kg/m?   ? ?CC: R leg pain  ?Subjective:  ? ?HPI: ?Lonzell Dorris. is a 61 y.o. male presenting on 12/08/2021 for Leg Pain (C/o posterior upper R leg pain.  Also, numbness down to foot. Started about 3 wks ago. ) ? ? ?3 wk h/o R leg pain - R buttock pain down posterior thigh pain down to the knee, with associated R lower leg and foot numbness, R leg feels weak.  ?No fevers/chills, bowel/bladder incontinence, saddle anesthesia, skin rash, nausea/vomiting.  ?Treating with advil '600mg'$  4-5 times a day. Also takes amitriptyline '75mg'$  nightly and gabapentin 400/400/'800mg'$  daily.  ? ?Recent diagnosis of appendiceal goblet cell adenocarcinoma in setting of acute appendicitis status post right hemicolectomy 11/15/2021 with tumor invading visceral peritoneum and 2 of 20 positive lymph nodes for metastatic cancer.  Last week established with Dr. Benay Spice oncology-appreciate his care.  Discussed starting chemotherapy.  He is planning to see [facility] this week for second opinion.  ? ?History of squamous cell carcinoma of the base of the tongue status post neck dissection and chemotherapy 2010. ? ?He did recently have right leg venous ultrasound rule out DVT 10/31/2021. ? ?   ? ?Relevant past medical, surgical, family and social history reviewed and updated as indicated. Interim medical history since our last visit reviewed. ?Allergies and medications reviewed and updated. ?Outpatient Medications Prior to Visit  ?Medication Sig Dispense Refill  ? amitriptyline (ELAVIL) 50 MG tablet TAKE 1 & 1/2 (ONE & ONE-HALF) TABLETS BY MOUTH AT BEDTIME (Patient taking differently: Take 75 mg by mouth at bedtime.) 135 tablet 1  ? amLODipine (NORVASC) 10 MG tablet Take 1 tablet by mouth  once daily (Patient taking differently: Take 10 mg by mouth daily.) 90 tablet 1  ? atorvastatin (LIPITOR) 40 MG tablet Take 1 tablet by mouth once daily (Patient taking differently: Take 40 mg by mouth daily.) 90 tablet 0  ? clopidogrel (PLAVIX) 75 MG tablet Take 1 tablet by mouth once daily (Patient taking differently: Take 75 mg by mouth daily.) 90 tablet 3  ? fenofibrate 160 MG tablet Take 1 tablet by mouth once daily (Patient taking differently: Take 160 mg by mouth daily.) 90 tablet 0  ? gabapentin (NEURONTIN) 400 MG capsule TAKE 1 CAPSULE BY MOUTH TWICE DAILY AND 2 AT BEDTIME (Patient taking differently: Take 400-800 mg by mouth See admin instructions. 400 mg in the morning and midday ?800 mg at bedtime) 120 capsule 0  ? lansoprazole (PREVACID) 15 MG capsule Take 1 capsule (15 mg total) by mouth daily at 12 noon. (Patient taking differently: Take 15 mg by mouth daily.) 90 capsule 1  ? levothyroxine (SYNTHROID) 150 MCG tablet Take 1 tablet by mouth once daily (Patient taking differently: Take 150 mcg by mouth daily before breakfast.) 90 tablet 0  ? metoprolol tartrate (LOPRESSOR) 100 MG tablet Take 1 tablet by mouth twice daily (Patient taking differently: Take 100 mg by mouth 2 (two) times daily.) 180 tablet 0  ? MITIGARE 0.6 MG CAPS Take 0.6 mg by mouth daily as needed (gout flare). TAKE 1 CAPSULE BY MOUTH IN THE MORNING  AS NEEDED (Patient taking differently: Take 0.6 mg by mouth daily as needed (gout flare).)    ? Omega-3 Fatty Acids (FISH OIL) 1000 MG CAPS Take 2 capsules (2,000 mg total) by mouth 2 (two) times a day.  0  ? sodium fluoride (DENTAGEL) 1.1 % GEL dental gel Place 1 application onto teeth at bedtime. 56 g 7  ? acetaminophen (TYLENOL) 325 MG tablet Take 2 tablets (650 mg total) by mouth every 6 (six) hours as needed.    ? ?No facility-administered medications prior to visit.  ?  ? ?Per HPI unless specifically indicated in ROS section below ?Review of Systems ? ?Objective:  ?BP 136/84   Pulse 71    Temp (!) 97.5 ?F (36.4 ?C) (Temporal)   Ht '5\' 10"'$  (1.778 m)   Wt 203 lb 4 oz (92.2 kg)   SpO2 96%   BMI 29.16 kg/m?   ?Wt Readings from Last 3 Encounters:  ?12/08/21 203 lb 4 oz (92.2 kg)  ?12/05/21 200 lb 3.2 oz (90.8 kg)  ?12/01/21 201 lb 6.4 oz (91.4 kg)  ?  ?  ?Physical Exam ?Vitals and nursing note reviewed.  ?Constitutional:   ?   Appearance: Normal appearance.  ?   Comments: Uncomfortable with any pressure on right posterior thigh  ?Musculoskeletal:     ?   General: Tenderness present.  ?   Right lower leg: No edema.  ?   Left lower leg: No edema.  ?   Comments:  ?No significant pain midline spine ?No paraspinous mm tenderness ?Discomfort with SLR bilaterally. ?Reproducible pain localized to posterior thigh with int/ext rotation at hip. ?Point tenderness to palpation of right proximal posterior thigh without obvious mass or erythema or skin change  ?Skin: ?   General: Skin is warm and dry.  ?   Findings: No erythema or rash.  ?Neurological:  ?   Mental Status: He is alert.  ?   Comments:  ?5/5 strength BLE, discomfort with testing R hip flexor and R knee extenders against resistance ?Diminished DTRs bilaterally ?Antalgic gait  ?Psychiatric:     ?   Mood and Affect: Mood normal.     ?   Behavior: Behavior normal.  ? ?   ? ?Assessment & Plan:  ? ?Problem List Items Addressed This Visit   ? ? Right thigh pain - Primary  ?  Point tender to palpation of right posterior proximal thigh.  Describes severe pain present at least for the last several weeks. ?He has been taking high-dose Advil as well as his regular amitriptyline and gabapentin 400/400/800 without much relief. ?With recent cancer diagnosis and point tenderness to bone check femur x-ray rule out pathological fracture. ?In interim will prescribe oxycodone 5/325 #15 to use 3 times daily as needed severe pain as he has an allergy to hydrocodone (hives). ? ?ADDENDUM: X-ray with lucency to posterior femur.  Spoke with radiologist, will order  noncontrasted CT of hip, ask to ensure 50% of proximal femur is evaluated.  I spoke with patient and asked him to start using crutches to avoid weightbearing.  He has crutches at home. ? ?  ?  ? Relevant Orders  ? DG FEMUR, MIN 2 VIEWS RIGHT (Completed)  ? CT HIP RIGHT WO CONTRAST  ? Cancer of appendix ([place])  ?  New diagnosis last month presenting in setting of acute appendicitis.  Appreciate surgical and oncologic care.  Pending second opinion at [facility] then to consider starting chemotherapy. ? ?  ?  ?  ? ?  Meds ordered this encounter  ?Medications  ? oxyCODONE-acetaminophen (PERCOCET/ROXICET) 5-325 MG tablet  ?  Sig: Take 0.5-1 tablets by mouth 3 (three) times daily as needed for up to 5 days for severe pain.  ?  Dispense:  15 tablet  ?  Refill:  0  ? ?Orders Placed This Encounter  ?Procedures  ? DG FEMUR, MIN 2 VIEWS RIGHT  ?  Standing Status:   Future  ?  Number of Occurrences:   1  ?  Standing Expiration Date:   12/09/2022  ?  Order Specific Question:   Reason for Exam (SYMPTOM  OR DIAGNOSIS REQUIRED)  ?  Answer:   R thigh pain in h/o appendiceal cancer  ?  Order Specific Question:   Preferred imaging location?  ?  Answer:   [facility]  ? CT HIP RIGHT WO CONTRAST  ?  Touched base with radiology Dr Sharla Kidney. Please include proximal 50% of R femur.  ?  Standing Status:   Future  ?  Standing Expiration Date:   12/09/2022  ?  Order Specific Question:   Preferred imaging location?  ?  Answer:   [facility]  ? ? ? ?Patient Instructions  ?R leg bone xray today.  ?If normal, we will proceed with R thigh CT scan or MRI.  ?Use oxycodone for pain relief - take 1/2 -1 tablet twice daily as needed for pain.  ?Continue metamucil, may add senna or sennakot over the counter as needed while on pain medicine.  ? ?Follow up plan: ?Return if symptoms worsen or fail to improve. ? ?Ria Bush, MD   ?

## 2021-12-08 NOTE — Patient Instructions (Addendum)
R leg bone xray today.  ?If normal, we will proceed with R thigh CT scan or MRI.  ?Use oxycodone for pain relief - take 1/2 -1 tablet twice daily as needed for pain.  ?Continue metamucil, may add senna or sennakot over the counter as needed while on pain medicine.  ?

## 2021-12-08 NOTE — Progress Notes (Signed)
Left message for patient to call regarding his appt with Dr Benay Spice or need for referral for consult at [facility].  ?

## 2021-12-08 NOTE — Assessment & Plan Note (Signed)
New diagnosis last month presenting in setting of acute appendicitis.  Appreciate surgical and oncologic care.  Pending second opinion at [facility] then to consider starting chemotherapy. ?

## 2021-12-09 ENCOUNTER — Telehealth: Payer: Self-pay

## 2021-12-09 NOTE — Telephone Encounter (Signed)
[employer] Night - Client ?Nonclinical Telephone Record  ?[facility]? ?Client [place] Night - Client ?Client Site [place] ?Provider Ria Bush - MD ?Contact Type Call ?Who Is Calling Patient / Member / Family / Caregiver ?Caller Name Tashon Capp ?Caller Phone Number [phone] ?Patient Name Patrick Bailey ?Patient DOB 08/21/2060 ?Call Type Message Only Information Provided ?Reason for Call Request to Schedule Office Appointment ?Initial Comment Caller states that he is needing an appt for xray. ?Patient request to speak to RN No ?Disp. Time Disposition Final User ?12/09/2021 7:52:06 AM General Information Provided Yes Ky Barban ?Call Closed By: Ky Barban ?Transaction Date/Time: 12/09/2021 7:49:02 AM (ET ? ? ? ?See imaging note on xray of femur; sending note to [facility] and [facility] CMA. ?

## 2021-12-09 NOTE — Telephone Encounter (Signed)
Spoke with pt providing phn # [phone] to call and schedule CT.  Verbalizes understanding.  ?

## 2021-12-09 NOTE — Telephone Encounter (Signed)
Can we keep trying to reach patient to give him # to call and schedule his leg CT?  ?

## 2021-12-09 NOTE — Telephone Encounter (Signed)
Location is listed in the order [facility]) - I was unaware that the patient called this morning until I saw the phone note and referral note placed in the chart. I had left the patient a detailed message 12/08/21 with the phone number of where to call to schedule his CT.  ? ?ATC patient - Unable to reach patient, left two detailed messages with phone number for where he needed to call and schedule. Tried calling both numbers, unable to get through on [phone]. ?  ?He needs to call [facility] to schedule [phone] ?They will schedule him at any of the locations in [place] ?

## 2021-12-10 ENCOUNTER — Other Ambulatory Visit: Payer: Self-pay | Admitting: Family Medicine

## 2021-12-10 ENCOUNTER — Telehealth: Payer: Self-pay | Admitting: Family Medicine

## 2021-12-10 ENCOUNTER — Other Ambulatory Visit: Payer: Self-pay

## 2021-12-10 ENCOUNTER — Ambulatory Visit
Admission: RE | Admit: 2021-12-10 | Discharge: 2021-12-10 | Disposition: A | Discharge status: Home / Self Care | Payer: BC Managed Care – PPO | Source: Ambulatory Visit | Attending: Family Medicine | Admitting: Family Medicine

## 2021-12-10 DIAGNOSIS — M79651 Pain in right thigh: Secondary | ICD-10-CM

## 2021-12-10 DIAGNOSIS — M533 Sacrococcygeal disorders, not elsewhere classified: Secondary | ICD-10-CM | POA: Diagnosis not present

## 2021-12-10 DIAGNOSIS — M1611 Unilateral primary osteoarthritis, right hip: Secondary | ICD-10-CM | POA: Diagnosis not present

## 2021-12-10 MED ORDER — OXYCODONE-ACETAMINOPHEN 5-325 MG PO TABS: 0.5000 | ORAL_TABLET | Freq: Three times a day (TID) | ORAL | 0 refills | Status: AC | PRN

## 2021-12-10 NOTE — Telephone Encounter (Signed)
Pt request call back with results from CT scan whenever we have results and Dr Darnell Level has reviewed  ?

## 2021-12-10 NOTE — Telephone Encounter (Signed)
Pt scanned 12/10/21 ? ?Nothing further needed.  ? ?

## 2021-12-10 NOTE — Progress Notes (Signed)
The proposed treatment discussed in conference is for discussion purpose only and is not a binding recommendation.  The patients have not been physically examined, or presented with their treatment options.  Therefore, final treatment plans cannot be decided.  

## 2021-12-10 NOTE — Telephone Encounter (Signed)
Spoke with pt notifying him we are still waiting on CT results.  Pt verbalizes understanding.  ?

## 2021-12-11 ENCOUNTER — Ambulatory Visit: Admit: 2021-12-11 | Discharge: 2021-12-12 | Payer: PRIVATE HEALTH INSURANCE

## 2021-12-11 DIAGNOSIS — C181 Malignant neoplasm of appendix: Principal | ICD-10-CM

## 2021-12-15 ENCOUNTER — Telehealth: Payer: Self-pay | Admitting: Family Medicine

## 2021-12-15 DIAGNOSIS — M79651 Pain in right thigh: Secondary | ICD-10-CM

## 2021-12-15 DIAGNOSIS — M4726 Other spondylosis with radiculopathy, lumbar region: Secondary | ICD-10-CM

## 2021-12-15 LAB — SURGICAL PATHOLOGY

## 2021-12-15 NOTE — Telephone Encounter (Signed)
Thigh CT was reassuring.  ?MRI from 8/021 showed R L4/5 herniated disc (causing moderate right foraminal narrowing and moderate right lateral recess stenosis. The right L5 nerve root is slightly displaced posteriorly but not compressed) and I think this is where his pain is coming from.  ?I have referral in for further evaluation by PM&R, awaiting scheduling.  ?Plz notify pt - I will order updated lumbar MRI to try and expedite treatment. Will place urgent referral and let [facility] know. ?

## 2021-12-15 NOTE — Telephone Encounter (Signed)
Patient is calling to get results of CT scan - please call back ?

## 2021-12-15 NOTE — Telephone Encounter (Addendum)
Called patient and read the results that were given to him by Dr. Danise Mina. Patient stated that he did not understand the whole conversation. Patient stated that he is having severe pain and wants to know what else can be done. Patient stated that he has an appointment at [facility] Friday and then leaving for the beach. Patient stated that he would like his referral as soon as possible so that he can hopefully get some help. ?

## 2021-12-15 NOTE — Telephone Encounter (Signed)
Spoke with pt relaying Dr. G's message.  Pt verbalizes understanding and expresses his thanks.  

## 2021-12-16 NOTE — Telephone Encounter (Signed)
I was out of the office when notification of this MRI was sent and I missed the message until this afternoon.  ?I will work on precert first thing tomorrow AM 12/17/2021 and get patient scheduled - he should be able to be scanned regardless tomorrow as long as MRI is approved by [employer].  ?

## 2021-12-16 NOTE — Telephone Encounter (Signed)
Pt called in stating that he is in severe pain and is wondering how long it will be before he hears about scheduling MRI. Requesting a call back as soon as possible.  ?

## 2021-12-17 ENCOUNTER — Other Ambulatory Visit: Payer: Self-pay | Admitting: Neurology

## 2021-12-17 NOTE — Telephone Encounter (Signed)
Approved by [employer]    ?I have been unable to reach Patrick Bailey - his MRI is approved and ready to schedule. I have left him a detailed message to call and schedule with [facility] [phone]) ? ?Since this is a STAT they will scheduled it ASAP ? ? ?

## 2021-12-17 NOTE — Telephone Encounter (Signed)
Rx refilled. Pt must keep upcoming appointment for further refills. ?

## 2021-12-17 NOTE — Telephone Encounter (Signed)
Spoke to patient by telephone and was advised that he has scheduled the MRI for Sunday. Patient stated that he is having a lot of pain and is anxious to find out why he is hurting so bad. ?

## 2021-12-17 NOTE — Telephone Encounter (Signed)
Pt scheduled 12/21/21 at 6pm ? ?Nothing further needed.  ? ?

## 2021-12-19 ENCOUNTER — Inpatient Hospital Stay: Payer: BC Managed Care – PPO | Admitting: Nurse Practitioner

## 2021-12-19 ENCOUNTER — Telehealth: Payer: Self-pay

## 2021-12-19 ENCOUNTER — Ambulatory Visit
Admit: 2021-12-19 | Discharge: 2021-12-20 | Payer: PRIVATE HEALTH INSURANCE | Attending: Hematology & Oncology | Primary: Hematology & Oncology

## 2021-12-19 DIAGNOSIS — C181 Malignant neoplasm of appendix: Principal | ICD-10-CM

## 2021-12-19 DIAGNOSIS — G629 Polyneuropathy, unspecified: Principal | ICD-10-CM

## 2021-12-19 DIAGNOSIS — Z9221 Personal history of antineoplastic chemotherapy: Secondary | ICD-10-CM | POA: Diagnosis not present

## 2021-12-19 DIAGNOSIS — F1090 Alcohol use, unspecified, uncomplicated: Secondary | ICD-10-CM | POA: Diagnosis not present

## 2021-12-19 DIAGNOSIS — I10 Essential (primary) hypertension: Secondary | ICD-10-CM | POA: Diagnosis not present

## 2021-12-19 DIAGNOSIS — Z87891 Personal history of nicotine dependence: Secondary | ICD-10-CM | POA: Diagnosis not present

## 2021-12-19 DIAGNOSIS — E039 Hypothyroidism, unspecified: Secondary | ICD-10-CM | POA: Diagnosis not present

## 2021-12-19 DIAGNOSIS — Z6827 Body mass index (BMI) 27.0-27.9, adult: Secondary | ICD-10-CM | POA: Diagnosis not present

## 2021-12-19 DIAGNOSIS — K219 Gastro-esophageal reflux disease without esophagitis: Secondary | ICD-10-CM | POA: Diagnosis not present

## 2021-12-19 DIAGNOSIS — I2510 Atherosclerotic heart disease of native coronary artery without angina pectoris: Secondary | ICD-10-CM | POA: Diagnosis not present

## 2021-12-19 DIAGNOSIS — Z808 Family history of malignant neoplasm of other organs or systems: Secondary | ICD-10-CM | POA: Diagnosis not present

## 2021-12-19 DIAGNOSIS — Z803 Family history of malignant neoplasm of breast: Secondary | ICD-10-CM | POA: Diagnosis not present

## 2021-12-19 DIAGNOSIS — Z96653 Presence of artificial knee joint, bilateral: Secondary | ICD-10-CM | POA: Diagnosis not present

## 2021-12-19 DIAGNOSIS — Z800 Family history of malignant neoplasm of digestive organs: Secondary | ICD-10-CM | POA: Diagnosis not present

## 2021-12-19 MED ORDER — OXYCODONE-ACETAMINOPHEN 5-325 MG PO TABS: 0.5000 | ORAL_TABLET | Freq: Three times a day (TID) | ORAL | 0 refills | Status: DC | PRN

## 2021-12-19 MED ORDER — PROCHLORPERAZINE MALEATE 10 MG TABLET
ORAL_TABLET | Freq: Four times a day (QID) | ORAL | 3 refills | 15 days | Status: CP | PRN
Start: 2021-12-19 — End: 2022-02-17

## 2021-12-19 MED ORDER — LOPERAMIDE 2 MG TABLET
ORAL_TABLET | 11 refills | 0 days | Status: CP
Start: 2021-12-19 — End: ?

## 2021-12-19 NOTE — Telephone Encounter (Signed)
Patient called stating he was following up on refill of Oxycodone that pharmacy was suppose to have sent a request to [place] but we do not have this as of yet, maybe still in faxed. Patient did not want to go over the weekend without with the pain and discomfort, was diagnosed with colon cancer. Please review. Please let patient know today about this. This medication is not on his current medication list. Thank you

## 2021-12-19 NOTE — Telephone Encounter (Signed)
Plz notify I've sent oxycodone to his pharmacy.  Will await to see MRI results over the weekend.

## 2021-12-19 NOTE — Telephone Encounter (Signed)
Spoke with pt relaying Dr. Synthia Innocent message.  Verbalizes understanding.   Pt wants to make Dr. Darnell Level aware he will not make it for 12/22/21 OV.  Says he is going to the beach one more time before the cancer gets bad.  Pt will call back to r/s.  C/x OV.

## 2021-12-20 ENCOUNTER — Encounter (INDEPENDENT_AMBULATORY_CARE_PROVIDER_SITE_OTHER): Payer: Self-pay | Admitting: Nurse Practitioner
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
## 2021-12-21 ENCOUNTER — Ambulatory Visit
Admission: RE | Admit: 2021-12-21 | Discharge: 2021-12-21 | Disposition: A | Discharge status: Home / Self Care | Payer: BC Managed Care – PPO | Source: Ambulatory Visit | Attending: Family Medicine | Admitting: Family Medicine

## 2021-12-21 DIAGNOSIS — M4726 Other spondylosis with radiculopathy, lumbar region: Secondary | ICD-10-CM | POA: Insufficient documentation

## 2021-12-21 DIAGNOSIS — M79604 Pain in right leg: Secondary | ICD-10-CM | POA: Diagnosis not present

## 2021-12-21 DIAGNOSIS — M48061 Spinal stenosis, lumbar region without neurogenic claudication: Secondary | ICD-10-CM | POA: Diagnosis not present

## 2021-12-21 DIAGNOSIS — M79651 Pain in right thigh: Secondary | ICD-10-CM | POA: Diagnosis not present

## 2021-12-21 DIAGNOSIS — M47816 Spondylosis without myelopathy or radiculopathy, lumbar region: Secondary | ICD-10-CM | POA: Diagnosis not present

## 2021-12-21 DIAGNOSIS — M5416 Radiculopathy, lumbar region: Secondary | ICD-10-CM | POA: Diagnosis not present

## 2021-12-22 ENCOUNTER — Other Ambulatory Visit: Payer: Self-pay | Admitting: Family Medicine

## 2021-12-22 ENCOUNTER — Telehealth: Payer: Self-pay

## 2021-12-22 ENCOUNTER — Ambulatory Visit: Payer: BC Managed Care – PPO | Admitting: Family Medicine

## 2021-12-22 ENCOUNTER — Encounter
Admit: 2021-12-22 | Discharge: 2021-12-22 | Payer: PRIVATE HEALTH INSURANCE | Attending: Hematology & Oncology | Primary: Hematology & Oncology

## 2021-12-22 DIAGNOSIS — C181 Malignant neoplasm of appendix: Principal | ICD-10-CM

## 2021-12-22 DIAGNOSIS — M79651 Pain in right thigh: Secondary | ICD-10-CM

## 2021-12-22 DIAGNOSIS — M7138 Other bursal cyst, other site: Secondary | ICD-10-CM

## 2021-12-22 DIAGNOSIS — M4726 Other spondylosis with radiculopathy, lumbar region: Secondary | ICD-10-CM

## 2021-12-22 NOTE — Telephone Encounter (Signed)
Attempted to contact pt.  No answer, no vm.  Need to relay lumbar MRI results and Dr. Synthia Innocent message.  Results have also been mailed to pt.   MRI/Dr. Synthia Innocent msg: Plz notify lumbar MRI returned showing he has a new cyst at L5/S1 spine joint on right that is pressing on R S1 nerve root.  This is likely where his right leg pain is coming from.  Will refer to neurosurgeon for further recommendations/evaluation.

## 2021-12-23 ENCOUNTER — Telehealth: Payer: Self-pay | Admitting: *Deleted

## 2021-12-23 DIAGNOSIS — C181 Malignant neoplasm of appendix: Principal | ICD-10-CM

## 2021-12-23 NOTE — Unmapped (Signed)
[facility] Specialty Medication Onboarding    Specialty Medication: Capecitabine 500mg  tablet  Prior Authorization: Approved   Financial Assistance: No - copay  <$25  Final Copay/Day Supply: $0 / 14 days    Insurance Restrictions: None     Notes to Pharmacist: N/A    The triage team has completed the benefits investigation and has determined that the patient is able to fill this medication at [facility]. Please contact the patient to complete the onboarding or follow up with the prescribing physician as needed.

## 2021-12-23 NOTE — Unmapped (Signed)
Reviewed [employer] testing and financial assistance application with patient. Application completed online: [Your application was approved for full coverage.??Your out-of-pocket costs will be $0.]. Patient informed of [employer] decision.

## 2021-12-23 NOTE — Unmapped (Signed)
Pharmacist Capecitabine Education     [name] is a 60 y.o. male with appendiceal adenocarcinoma who was counseled on the use of capecitabine.    Dose and schedule information discussed:  Capecitabine dose to be taken every 12 hours for 14 days followed by 7 days off each 21-day cycle.  Take with water within 30 minutes after a meal for morning and evening doses.  Exact dosing and cycle 1 calendar to be reviewed at a later date with patient once finalized.    Missed dose information discussed: If a dose is missed, do not take an extra dose or two doses at one time. Simply take your next dose at the regularly scheduled time and record any missed doses so our team is aware.    Side effects and warnings/precautions discussed (including but not limited to):     Complications of myelosuppression  Diarrhea  Mucositis  Hand-foot syndrome  Liver dysfunction  Nausea  Fatigue  Alopecia  Fluid retention or swelling  Reproductive concerns    A corresponding management plan for these adverse effects was also shared with the patient. Instructions on when to contact our care team were discussed with the patient.    Storage, handling, and disposal information discussed: Oral chemotherapy handling precautions reviewed. Store at room temperature, in a dry location, away from light. Keep out of reach of others including children and pets. Keep stored in originally provided packaging until the medication is ready to be taken. Do not flush unused medication down the toilet or sink or throw away in household trash; throw away safely by returning unused medication to [facility] or to a local take-back program for proper disposal.    Education handout provided from: [facility] patient education database    Adherence discussed: Stressed the importance of taking medication as prescribed and to contact provider if that changes at any time.    All of the patient's questions were answered and the patient confirmed understanding of how to take their medication.    Care coordination: 15 minutes

## 2021-12-23 NOTE — Telephone Encounter (Signed)
Called patient regarding missed appointment on 5/19--noted he saw Dr. Altamease Oiler at [facility] same day. He reports he plans to have all his oncology treatment and follow up at [facility].

## 2021-12-23 NOTE — Telephone Encounter (Addendum)
Attempted to contact pt.  No answer, no vm.  Need to relay lumbar MRI results and Dr. Synthia Innocent message. Also, results mailed to pt.   MRI/Dr. Synthia Innocent msg: Plz notify lumbar MRI returned showing he has a new cyst at L5/S1 spine joint on right that is pressing on R S1 nerve root.  This is likely where his right leg pain is coming from.  Will refer to neurosurgeon for further recommendations/evaluation.

## 2021-12-24 NOTE — Unmapped (Signed)
[facility]   Patient Onboarding/Medication Counseling    Sergio Perez is a 61 y.o. male with malignant neoplasm of appendix who I am counseling today on initiation of therapy.  I am speaking to the patient.    Was a Nurse, learning disability used for this call? No    Verified patient's date of birth / HIPAA.    Specialty medication(s) to be sent: Hematology/Oncology: Capecitabine 500mg , directions: Take 5 tablets (2500 mg) by mouth two times a day for 14 days followed by 7 days off.      Non-specialty medications/supplies to be sent: n/a      Medications not needed at this time: n/a         Xeloda (capecitabine)  The patient declined counseling on missed dose instructions, goals of therapy, side effects and monitoring parameters, warnings and precautions, and storage, handling precautions, and disposal because they were counseled in clinic on 12/19/21. The information in the declined sections below are for informational purposes only and was not discussed with patient.     Medication & Administration     Dosage: Take 5 tablets (2500 mg) twice a day for 14 days followed by 7 days off.    Administration: I reviewed the importance of taking with a full glass of water within 30 minutes of a meal (at least 1 cup of food). Discussed that tablet should be swallowed whole and cannot be crushed or chewed.    Adherence/Missed dose instruction: If a dose is missed, do not take an extra dose or two doses at one time. Simply take your next dose at the regularly scheduled time and record any missed doses so our team is aware.    Goals of Therapy     Prevent disease progression    Side Effects & Monitoring Parameters     Nausea/vomiting  Diarrhea/constipation  Infection precautions  Fatigue  Mouth sores or irritation  Hand/foot syndrome (tingling, numbness, pain, redness, peeling or blistering) Use Urea 20% cream, Aquaphor, Eucerin, Cetaphil, or CeraVe twice daily on hands and feet as preventative  Sun precautions  Decrease appetite/ taste changes  Bleeding precautions (bruising easily, nose bleeds, gums bleed)  Headache  Dry skin  Hair loss    The following side effects should be reported to the provider:  Diarrhea not controlled by anti-diarrheals  Swelling, warmth, numbness, change of color or pain in a leg or arm  Signs of infection (fever >100.4, chills, sore throat, sputum production)  Signs of liver problems (dark urine, abdominal pain, light-colored stools, vomiting, yellow skin or eyes)  Signs of bleeding (vomiting or coughing up blood, blood that looks like coffee grounds, blood in the urine or black, red tarry stools, bruising that gets bigger without reason, any persistent or severe bleeding)  Skin rash or signs of skin infection (cracking, peeling, blistering, bleeding skin, red or irritated eyes)  Signs of fluid and electrolyte problems (mood changes, confusion, abnormal/fast  heartbeat, severe dizziness, passing out, increased thirst, seizures, loss of strength and energy, lack of appetite, unable to pass urine or change in amount of urine passed, dry mouth, dry eyes, or nausea or vomiting.  Signs of hand foot syndrome (redness or irritation on the palms of the hands or soles of the feet)  Signs of mucositis (red or swollen mouth/gums, sores in the mouth, gums or tongue, soreness or pain in the mouth or throat, difficulty swallowing or talking, dryness, mild burning, or pain when eating food white patches or pus in the  mouth or on the tongue, increased mucus or thicker saliva)  Signs of anaphylaxis (wheezing, chest tightness, swelling of face, lips, tongue or throat)    Monitoring parameters: Renal function should be estimated at baseline to determine initial dose. During therapy, CBC with differential, hepatic function, and renal function should be monitored. Monitor INR closely if receiving concomitant warfarin. Pregnancy test prior to treatment initiation (in females of reproductive potential). Monitor for diarrhea, dehydration, hand-foot syndrome, Stevens-Johnson syndrome, toxic epidermal necrolysis, stomatitis, and cardiotoxicity. Monitor adherence.    Contraindications, Warnings & Precautions     [place] Boxed Warning: Capecitabine may increase the anticoagulant effects of warfarin; bleeding events, including death, have occurred with concomitant use. Clinically significant increases in prothrombin time (PT) and INR have occurred within several days to months after capecitabine initiation (in patients previously stabilized on anticoagulants), and may continue up to 1 month after capecitabine discontinuation. May occur in patients with or without liver metastases. Monitor PT and INR frequently and adjust anticoagulation dosing accordingly. An increased risk of coagulopathy is correlated with a cancer diagnosis and age >60 years.    Contraindications:  Known hypersensitivity to capecitabine, fluorouracil, or any component of the formulation;   Severe renal impairment (CrCl <30 mL/minute)    Warnings and Precautions:  Bone marrow suppression  Cardiotoxicity: Myocardial infarction, ischemia, angina, dysrhythmias, cardiac arrest, cardiac failure, sudden death, ECG changes, and cardiomyopathy  Mouth sores-discussed use of baking soda/salt water rinses  Dermatologic toxicity: Stevens-Johnson syndrome and toxic epidermal necrolysis   Hand/foot prevention (moisturizers, luke warm hand washes, patting hands/feet dry, decrease rubbing on hands/feet)  GI toxicity: May cause diarrhea (may be severe); Importance of good nutrition to help minimize diarrhea (high protein, BRAT, yogurt, avoid greasy and spicy foods).  Importance of hydration if having frequent diarrhea  Reproductive concerns: Evaluate pregnancy status prior to therapy in females of reproductive potential. Females of reproductive potential should use effective contraception during treatment and for 6 months after the last dose. Males with male partners of reproductive potential should use effective contraception during treatment and for 3 months after the last dose. Based on the mechanism of action and data from animal reproduction studies, in utero exposure to capecitabine may cause fetal harm.   Breast feeding considerations: It is not known if capecitabine is present in breast milk. Due to the potential for serious adverse reactions in the breastfed infant, breastfeeding is not recommended by the manufacturer during treatment and for 2 weeks after the last dose.    Drug/Food Interactions     Medication list reviewed in Epic. The patient was instructed to inform the care team before taking any new medications or supplements.  Drug interaction identified with Omeprazole.  Patient advised to stop Omeprazole and use Famotidine 10 mg daily instead for acid reflux  I told him that he can increase to 20 mg daily if no relief with 10 mg daly .   Avoid live vaccines.    Storage, Handling Precautions, & Disposal     This medication should be stored at room temperature and in a dry location. Keep out of reach of others including children and pets. Keep the medicine in the original container with a child-proof top (no pillboxes). Do not throw away or flush unused medication down the toilet or sink. This drug is considered hazardous and should be handled as little as possible.  If someone else helps with medication administration, they should wear gloves.     Current Medications (including OTC/herbals), Comorbidities and Allergies  Current Outpatient Medications   Medication Sig Dispense Refill    amitriptyline (ELAVIL) 50 MG tablet Take 1 tablet (50 mg total) by mouth. Frequency:QHSPRN   Dosage:50   MG  Instructions:  Note:Dose: 50MG       amLODIPine (NORVASC) 10 MG tablet       aspirin (ECOTRIN) 81 MG tablet Take 300 mg by mouth. Frequency:QD   Dosage:300   MG  Instructions:  Note:Dose: 300MG       atorvastatin (LIPITOR) 10 MG tablet       [START ON 12/26/2021] capecitabine (XELODA) 500 MG tablet Take 5 tablets (2,500 mg total) by mouth Two (2) times a day . Take for days 1-14 of each cycle followed by 7 days off. 140 tablet 7    clopidogreL (PLAVIX) 75 mg tablet clopidogrel 75 mg tablet   TAKE 1 TABLET BY MOUTH ONCE DAILY      fluoride, sodium, (DENTAGEL) 1.1 % Gel Apply 1 application topically two (2) times a day. 56 g 7    gabapentin (NEURONTIN) 300 MG capsule gabapentin 300 mg capsule   TAKE 1 CAPSULE BY MOUTH TWICE DAILY      garlic 1,000 mg cap Take 1,000 mg by mouth. Frequency:BID   Dosage:1000   MG  Instructions:  Note:Dose: 1000MG       levothyroxine (SYNTHROID, LEVOTHROID) 150 MCG tablet Take 1 tablet (150 mcg total) by mouth daily. 30 tablet 3    loperamide (IMODIUM A-D) 2 mg tablet Take 2 tablets (4 mg) by mouth for first loose stool followed by 1 tablet (2 mg) after each loose stool thereafter (maximum of 8 tablets per day). 30 tablet 11    metoprolol tartrate (LOPRESSOR) 100 MG tablet       MITIGARE 0.6 mg cap capsule       omega-3 fatty acids-fish oil 340-1,000 mg capsule Take by mouth. Frequency:QD   Dosage:0.0     Instructions:  Note:Dose: 340-1000MG       omeprazole (PRILOSEC OTC) 20 MG tablet Take 1 tablet (20 mg total) by mouth. Frequency:QD   Dosage:20   MG  Instructions:  Note:Dose: 20 MG      prochlorperazine (COMPAZINE) 10 MG tablet Take 1 tablet (10 mg total) by mouth every six (6) hours as needed for nausea. 60 tablet 3    valACYclovir (VALTREX) 1000 MG tablet valacyclovir 1 gram tablet   TAKE 2 TABLETS BY MOUTH TWICE DAILY FOR ONE DAY PER EPISODE       No current facility-administered medications for this visit.       No Known Allergies    Patient Active Problem List   Diagnosis    Malignant neoplasm of tongue (CMS-HCC)    Squamous cell carcinoma of base of tongue (CMS-HCC)    Hypothyroidism    Alcohol abuse    Anxiety state    Chondrocalcinosis due to dicalcium phosphate crystals    Pseudogout    Chest pain    Adjustment disorder with depressed mood    Herpes simplex virus (HSV) infection    Essential hypertension    Insomnia    Knee injury    Malignant neoplasm of base of tongue (CMS-HCC)    Malignant neoplasm of oropharynx (CMS-HCC)    Olecranon bursitis    Shoulder joint pain    Infection of skin    Malignant neoplasm of pharynx (CMS-HCC)    Personal history of tobacco use, presenting hazards to health    Primary malignant neoplasm of appendix (CMS-HCC)  Reviewed and up to date in Epic.    Appropriateness of Therapy     Acute infections noted within Epic:  No active infections  Patient reported infection: None    Is medication and dose appropriate based on diagnosis and infection status? Yes    Prescription has been clinically reviewed: Yes      Baseline Quality of Life Assessment      How many days over the past month did your condition  keep you from your normal activities? For example, brushing your teeth or getting up in the morning. 0    Financial Information     Medication Assistance provided: Prior Authorization    Anticipated copay of $0 reviewed with patient. Verified delivery address.    Delivery Information     Scheduled delivery date: 5/30    Expected start date: 5/30-Patient to follow up with provider to make sure it's ok for him to start on 5/30.    Medication will be delivered via UPS to the prescription address in [facility].  This shipment will not require a signature.      Explained the services we provide at [facility] and that each month we would call to set up refills.  Stressed importance of returning phone calls so that we could ensure they receive their medications in time each month.  Informed patient that we should be setting up refills 7-10 days prior to when they will run out of medication.  A pharmacist will reach out to perform a clinical assessment periodically.  Informed patient that a welcome packet, containing information about our pharmacy and other support services, a Notice of Privacy Practices, and a drug information handout will be sent.      The patient or caregiver noted above participated in the development of this care plan and knows that they can request review of or adjustments to the care plan at any time.      Patient or caregiver verbalized understanding of the above information as well as how to contact the pharmacy at [phone] option 4 with any questions/concerns.  The pharmacy is open Monday through Friday 8:30am-4:30pm.  A pharmacist is available 24/7 via pager to answer any clinical questions they may have.    Patient Specific Needs     Does the patient have any physical, cognitive, or cultural barriers? No    Does the patient have adequate living arrangements? (i.e. the ability to store and take their medication appropriately) Yes    Did you identify any home environmental safety or security hazards? No    Patient prefers to have medications discussed with  Patient     Is the patient or caregiver able to read and understand education materials at a high school level or above? Yes    Patient's primary language is  English     Is the patient high risk? Yes, patient is taking oral chemotherapy. Appropriateness of therapy as been assessed    SOCIAL DETERMINANTS OF HEALTH     At the [facility], we have learned that life circumstances - like trouble affording food, housing, utilities, or transportation can affect the health of many of our patients.   That is why we wanted to ask: are you currently experiencing any life circumstances that are negatively impacting your health and/or quality of life? No    Social Determinants of Health     Financial Resource Strain: Low Risk     Difficulty of Paying Living Expenses: Not  hard at all   Internet Connectivity: Internet connectivity concern identified    Do you have access to internet services: No    How do you connect to the internet: Personal Device at home    Is your internet connection strong enough for you to watch video on your device without major problems?: Not on file    Do you have enough data to get through the month?: Not on file    Does at least one of the devices have a camera that you can use for video chat?: Not on file   Food Insecurity: No Food Insecurity    Worried About Running Out of Food in the Last Year: Never true    Ran Out of Food in the Last Year: Never true   Tobacco Use: Medium Risk    Smoking Tobacco Use: Former    Smokeless Tobacco Use: Never    Passive Exposure: Not on file   Housing/Utilities: Low Risk     Within the past 12 months, have you ever stayed: outside, in a car, in a tent, in an overnight shelter, or temporarily in someone else's home (i.e. couch-surfing)?: No    Are you worried about losing your housing?: No    Within the past 12 months, have you been unable to get utilities (heat, electricity) when it was really needed?: No   Alcohol Use: Not on file   Transportation Needs: No Transportation Needs    Lack of Transportation (Medical): No    Lack of Transportation (Non-Medical): No   Substance Use: Not on file   Health Literacy: Low Risk     : Never   Physical Activity: Not on file   Interpersonal Safety: Not on file   Stress: Not on file   Intimate Partner Violence: Not on file   Depression: Not on file   Social Connections: Not on file       Would you be willing to receive help with any of the needs that you have identified today? Not applicable       Silvana Holecek Glade Nurse  [facility] Specialty Pharmacist

## 2021-12-24 NOTE — Telephone Encounter (Signed)
Attempted to contact pt.  No answer, no vm.  Need to relay lumbar MRI results and Dr. Synthia Innocent message. Also, results mailed to pt.   MRI/Dr. Synthia Innocent msg: Plz notify lumbar MRI returned showing he has a new cyst at L5/S1 spine joint on right that is pressing on R S1 nerve root.  This is likely where his right leg pain is coming from.  Will refer to neurosurgeon for further recommendations/evaluation.

## 2021-12-25 NOTE — Unmapped (Signed)
I attempted to contact Sergio Perez on 12/25/2021 by phone in order to follow up on oral chemotherapy planning, but was unable to reach the patient. I was unable to leave a voicemail.    Care coordination: 3 minutes

## 2021-12-26 ENCOUNTER — Telehealth: Payer: Self-pay | Admitting: Family Medicine

## 2021-12-26 ENCOUNTER — Telehealth: Payer: Self-pay

## 2021-12-26 MED ORDER — AMLODIPINE BESYLATE 10 MG PO TABS: 10.0000 mg | ORAL_TABLET | Freq: Every day | ORAL | 0 refills | Status: DC

## 2021-12-26 MED ORDER — CAPECITABINE 500 MG TABLET
ORAL_TABLET | Freq: Two times a day (BID) | ORAL | 7 refills | 14 days | Status: CP
Start: 2021-12-26 — End: 2022-04-17
  Filled 2021-12-26: qty 140, 21d supply, fill #0

## 2021-12-26 NOTE — Unmapped (Signed)
Contacted patient by phone to confirm capecitabine initiation plan. Patient will receive delivery on 12/30/21 per [facility] documentation. Plan for patient to start cycle 1 of capecitabine on 12/31/21. Patient confirmed understanding of the plan:  Capecitabine 500 mg tablets: Take 5 tablets (2,500 mg total) by mouth Two (2) times a day. Take only on days 1-14 followed by 7 days off each 21-day cycle.    Cycle 1 calendar provided to [facility] team with request to send this with initial capecitabine delivery. Patient aware.    Care coordination: 15 minutes

## 2021-12-26 NOTE — Unmapped (Signed)
Addended by: Vernie Shanks on: 12/26/2021 11:10 AM     Modules accepted: Orders

## 2021-12-26 NOTE — Telephone Encounter (Signed)
error 

## 2021-12-26 NOTE — Telephone Encounter (Signed)
Pt rtn call.    I relayed results and Dr. G's message.  Pt verbalizes understanding.  

## 2021-12-26 NOTE — Telephone Encounter (Signed)
E-scribed refill 

## 2021-12-27 ENCOUNTER — Other Ambulatory Visit: Payer: Self-pay | Admitting: Family Medicine

## 2021-12-30 ENCOUNTER — Other Ambulatory Visit: Payer: Self-pay | Admitting: Family Medicine

## 2021-12-30 ENCOUNTER — Telehealth: Payer: Self-pay | Admitting: Family Medicine

## 2021-12-30 DIAGNOSIS — C181 Malignant neoplasm of appendix: Secondary | ICD-10-CM | POA: Diagnosis not present

## 2021-12-30 MED ORDER — OXYCODONE-ACETAMINOPHEN 5-325 MG PO TABS: 0.5000 | ORAL_TABLET | Freq: Three times a day (TID) | ORAL | 0 refills | Status: DC | PRN

## 2021-12-30 NOTE — Unmapped (Signed)
[facility] Central Navigation: Pre-Treatment Assessment (PTA)  Oncology Patient Navigator (OPN) completed PTA to introduce Central Navigation services and identify immediate non-medical barriers/needs.     Completed with: Patient     Summary: Patient is a cheerful 61 year old man who lives alone in [place], [place], and will be treated at [facility]. Patient states that he will begin oral chemo this week and requests that OPN call him back on Friday to check on him. OPN agreed to give him a call back. OPN advised him to call his medical team if he had any concerns. He denies any immediate barriers to care and describes a positive support system.      Readiness for Treatment: Patient understands treatment plan and knows how to contact medical personnel   MyChart user: No   Transportation issues: No  Positive support system: Yes  Need for Financial Assistance: No     Navigation Plan: Pre-Treatment Assessment (PTA) completed; Active Treatment Assessment (ATA) and necessary resourcing/support will occur in the coming weeks.     OPN provided contact information for patient if needed.

## 2021-12-30 NOTE — Telephone Encounter (Signed)
ERx 

## 2021-12-30 NOTE — Telephone Encounter (Signed)
Pt stated he would like Lattie Haw to give him a call about starting his Chemo

## 2021-12-30 NOTE — Telephone Encounter (Signed)
  Encourage patient to contact the pharmacy for refills or they can request refills through [place]:  Please schedule appointment if longer than 1 year  NEXT APPOINTMENT DATE:  MEDICATION:oxyCODONE-acetaminophen (PERCOCET/ROXICET) 5-325 MG tablet  Is the patient out of medication?   PHARMACY: [facility] :[address] [place]  Let patient know to contact pharmacy at the end of the day to make sure medication is ready.  Please notify patient to allow 48-72 hours to process  CLINICAL FILLS OUT ALL BELOW:   LAST REFILL:  QTY:  REFILL DATE:    OTHER COMMENTS:    Okay for refill?  Please advise

## 2021-12-30 NOTE — Addendum Note (Signed)
Addended by: Emelia Salisbury C on: 12/30/2021 01:12 PM   Modules accepted: Orders

## 2021-12-30 NOTE — Telephone Encounter (Signed)
Last refill 12/19/21 #20 Last office visit 12/18/21

## 2021-12-30 NOTE — Telephone Encounter (Signed)
Spoke to patient by telephone and was advised that he is concerned that the chemo medication is going to make him sick like it did the last time. Patient was advised to reach out to the doctor that prescribed the medication and let him know his concerns. Patient stated that he will call the doctor that prescribed the medication.

## 2022-01-02 NOTE — Unmapped (Signed)
[facility] Central Navigation: Follow-Up  Completed with: Patient      Oncology Patient Navigator (OPN) provided follow-up call to review previously discussed resources/identified barriers and evaluate current needs. Patient reports he???s been a little dizzy after taking his chemo pills but is doing okay. He expressed gratitude for calling and checking on him. OPN advised him to call his medical team if he had any concerns. Patient endorsed understanding of upcoming appointment times/location as well as how to contact medical team if needed.       Additional follow-up call will occur in the coming weeks.

## 2022-01-05 ENCOUNTER — Other Ambulatory Visit: Payer: Self-pay | Admitting: Family Medicine

## 2022-01-05 DIAGNOSIS — C181 Malignant neoplasm of appendix: Secondary | ICD-10-CM | POA: Diagnosis not present

## 2022-01-05 NOTE — Telephone Encounter (Signed)
Encourage patient to contact the pharmacy for refills or they can request refills through [place]:   5.8.23  NEXT APPOINTMENT DATE:   6.6.23   MEDICATION:oxyCODONE-acetaminophen (PERCOCET/ROXICET) 5-325 MG tablet   Is the patient out of medication? (2 pills left)   PHARMACY:  [facility], [place] Phone:  [phone]  Fax:  [phone]       Let patient know to contact pharmacy at the end of the day to make sure medication is ready.   Please notify patient to allow 48-72 hours to process

## 2022-01-06 ENCOUNTER — Ambulatory Visit (INDEPENDENT_AMBULATORY_CARE_PROVIDER_SITE_OTHER): Payer: BC Managed Care – PPO | Admitting: Family Medicine

## 2022-01-06 ENCOUNTER — Encounter: Payer: Self-pay | Admitting: Family Medicine

## 2022-01-06 VITALS — BP 136/82 | HR 75 | Temp 97.9°F | Ht 70.0 in | Wt 201.4 lb

## 2022-01-06 DIAGNOSIS — Z87891 Personal history of nicotine dependence: Secondary | ICD-10-CM

## 2022-01-06 DIAGNOSIS — Z125 Encounter for screening for malignant neoplasm of prostate: Secondary | ICD-10-CM | POA: Diagnosis not present

## 2022-01-06 DIAGNOSIS — Z7189 Other specified counseling: Secondary | ICD-10-CM

## 2022-01-06 DIAGNOSIS — F5104 Psychophysiologic insomnia: Secondary | ICD-10-CM

## 2022-01-06 DIAGNOSIS — M79651 Pain in right thigh: Secondary | ICD-10-CM

## 2022-01-06 DIAGNOSIS — M170 Bilateral primary osteoarthritis of knee: Secondary | ICD-10-CM

## 2022-01-06 DIAGNOSIS — Z0001 Encounter for general adult medical examination with abnormal findings: Secondary | ICD-10-CM

## 2022-01-06 DIAGNOSIS — E781 Pure hyperglyceridemia: Secondary | ICD-10-CM

## 2022-01-06 DIAGNOSIS — K219 Gastro-esophageal reflux disease without esophagitis: Secondary | ICD-10-CM

## 2022-01-06 DIAGNOSIS — C181 Malignant neoplasm of appendix: Secondary | ICD-10-CM

## 2022-01-06 DIAGNOSIS — M7138 Other bursal cyst, other site: Secondary | ICD-10-CM

## 2022-01-06 DIAGNOSIS — M1120 Other chondrocalcinosis, unspecified site: Secondary | ICD-10-CM

## 2022-01-06 DIAGNOSIS — M47816 Spondylosis without myelopathy or radiculopathy, lumbar region: Secondary | ICD-10-CM

## 2022-01-06 DIAGNOSIS — I10 Essential (primary) hypertension: Secondary | ICD-10-CM

## 2022-01-06 DIAGNOSIS — Z8673 Personal history of transient ischemic attack (TIA), and cerebral infarction without residual deficits: Secondary | ICD-10-CM

## 2022-01-06 DIAGNOSIS — E039 Hypothyroidism, unspecified: Secondary | ICD-10-CM

## 2022-01-06 DIAGNOSIS — F1090 Alcohol use, unspecified, uncomplicated: Secondary | ICD-10-CM

## 2022-01-06 DIAGNOSIS — Z85819 Personal history of malignant neoplasm of unspecified site of lip, oral cavity, and pharynx: Secondary | ICD-10-CM

## 2022-01-06 DIAGNOSIS — G6289 Other specified polyneuropathies: Secondary | ICD-10-CM

## 2022-01-06 MED ORDER — METOPROLOL TARTRATE 100 MG PO TABS: 100.0000 mg | ORAL_TABLET | Freq: Two times a day (BID) | ORAL | 3 refills | Status: DC

## 2022-01-06 MED ORDER — OXYCODONE-ACETAMINOPHEN 5-325 MG PO TABS: 1.0000 | ORAL_TABLET | Freq: Two times a day (BID) | ORAL | 0 refills | Status: DC | PRN

## 2022-01-06 MED ORDER — FAMOTIDINE 20 MG PO TABS: 20.0000 mg | ORAL_TABLET | Freq: Two times a day (BID) | ORAL | Status: DC

## 2022-01-06 NOTE — Progress Notes (Signed)
Patient ID: Patrick Golson., male    DOB: [birth year], 61 y.o.   MRN: [id number]  This visit was conducted in person.  BP 136/82   Pulse 75   Temp 97.9 F (36.6 C) (Temporal)   Ht '5\' 10"'$  (1.778 m)   Wt 201 lb 6 oz (91.3 kg)   SpO2 95%   BMI 28.89 kg/m    CC: CPE Subjective:   HPI: Patrick Bailey. is a 61 y.o. male presenting on 01/06/2022 for Annual Exam   Just [place] from the beach - very limited during trip due to leg pain.   Recent dx appendiceal goblet cell adenocarcinoma in setting of acute appendicitis s/p R hemicolectomy 11/2021, tumor invaded visceral peritoneum and 2/20 pos lymph nodes. Has seen [facility] oncology. Decided to follow with [facility] oncology - started treatment with capecitabine chemo last week.   Recent severe R thigh pain s/p MRI showing new cyst at R L5/S1 facet joint pressing on R S1 nerve root - referred to neurosurgery, appt pending. He's been treating pain with oxycodone/apap 5/'325mg'$ , requests refill. Last filled #20 on 12/30/2021.   H/o throat cancer s/p surgery/radiation 2010 ([facility] rad onc Dr Phoebe Sharps, [facility] E[facility] Dr [place] - last seen several years ago).    Cerebellar CVA 07/2016 affecting gait with mild residual imbalance, saw Dr [name] neurology. Sudden onset R upper field visual loss thought CRAO s/p hospitalization - now only on plavix    S/p R knee replacement 02/2019.  S/p L knee replacement 08/2017.   Preventative: COLONOSCOPY 03/2018 - mult TAs, diverticulosis, rpt 3 yrs (Nandigam) - desires to postpone at this time during cancer treatment.  Prostate cancer screening - no fmhx prostate cancer, PSA yearly.  Lung cancer screening - not eligible  Flu shot - declines  COVID vaccine Pfizer 11/2019, 12/2019 Tdap 05/2014 Shingrix - discussed, declines - has never had chicken pox  Advanced directive - doesn't have this set up. HCPOA would be father. Packet provided today. Full code. Would not want prolonged life support if terminal condition.   Seat belt use discussed  Sunscreen use discussed. No changing moles on skin.  Ex smoker - quit 2010. <13 PY hx. Around other smokers.  Alcohol - 3-4 beers daily, decrease from previously - advised further decreasing given current chemo Dentist - has not seen recently Eye exam - yearly   Lives alone - separated from wife. Occ: [employer] pipe [employer] Activity: physically active at work  Diet: good water, some fruits/vegetables      Relevant past medical, surgical, family and social history reviewed and updated as indicated. Interim medical history since our last visit reviewed. Allergies and medications reviewed and updated. Outpatient Medications Prior to Visit  Medication Sig Dispense Refill   amitriptyline (ELAVIL) 50 MG tablet TAKE 1 & 1/2 (ONE & ONE-HALF) TABLETS BY MOUTH AT BEDTIME (Patient taking differently: Take 75 mg by mouth at bedtime.) 135 tablet 1   amLODipine (NORVASC) 10 MG tablet Take 1 tablet (10 mg total) by mouth daily. 90 tablet 0   atorvastatin (LIPITOR) 40 MG tablet Take 1 tablet by mouth once daily 90 tablet 3   capecitabine (XELODA) 500 MG tablet Take 5 tablets (2,500 mg total) by mouth 2 (two) times daily after a meal. 2 wks on, 1 wk off     clopidogrel (PLAVIX) 75 MG tablet Take 1 tablet by mouth once daily (Patient taking differently: Take 75 mg by mouth daily.) 90 tablet 3  fenofibrate 160 MG tablet Take 1 tablet by mouth once daily (Patient taking differently: Take 160 mg by mouth daily.) 90 tablet 0   levothyroxine (SYNTHROID) 150 MCG tablet Take 1 tablet by mouth once daily 90 tablet 3   MITIGARE 0.6 MG CAPS Take 0.6 mg by mouth daily as needed (gout flare). TAKE 1 CAPSULE BY MOUTH IN THE MORNING AS NEEDED (Patient taking differently: Take 0.6 mg by mouth daily as needed (gout flare).)     Omega-3 Fatty Acids (FISH OIL) 1000 MG CAPS Take 2 capsules (2,000 mg total) by mouth 2 (two) times a day.  0   sodium fluoride (DENTAGEL) 1.1 % GEL dental gel  Place 1 application onto teeth at bedtime. 56 g 7   gabapentin (NEURONTIN) 400 MG capsule TAKE 1 CAPSULE BY MOUTH TWICE DAILY AND 2 AT BEDTIME 120 capsule 0   lansoprazole (PREVACID) 15 MG capsule Take 1 capsule (15 mg total) by mouth daily at 12 noon. (Patient taking differently: Take 15 mg by mouth daily.) 90 capsule 1   metoprolol tartrate (LOPRESSOR) 100 MG tablet Take 1 tablet by mouth twice daily (Patient taking differently: Take 100 mg by mouth 2 (two) times daily.) 180 tablet 0   oxyCODONE-acetaminophen (PERCOCET/ROXICET) 5-325 MG tablet Take 0.5-1 tablets by mouth 3 (three) times daily as needed for severe pain. 20 tablet 0   No facility-administered medications prior to visit.     Per HPI unless specifically indicated in ROS section below Review of Systems  Constitutional:  Negative for activity change, appetite change, chills, fatigue, fever and unexpected weight change.  HENT:  Negative for hearing loss.   Eyes:  Negative for visual disturbance.  Respiratory:  Negative for cough, chest tightness, shortness of breath and wheezing.   Cardiovascular:  Negative for chest pain, palpitations and leg swelling.  Gastrointestinal:  Positive for diarrhea. Negative for abdominal distention, abdominal pain, blood in stool, constipation, nausea and vomiting.  Genitourinary:  Negative for difficulty urinating and hematuria.  Musculoskeletal:  Negative for arthralgias, myalgias and neck pain.       Leg pain, neuropathy pain  Skin:  Negative for rash.  Neurological:  Positive for dizziness (chemo related). Negative for seizures, syncope and headaches.  Hematological:  Negative for adenopathy. Does not bruise/bleed easily.  Psychiatric/Behavioral:  Negative for dysphoric mood. The patient is not nervous/anxious.     Objective:  BP 136/82   Pulse 75   Temp 97.9 F (36.6 C) (Temporal)   Ht '5\' 10"'$  (1.778 m)   Wt 201 lb 6 oz (91.3 kg)   SpO2 95%   BMI 28.89 kg/m   Wt Readings from Last 3  Encounters:  01/07/22 201 lb 4 oz (91.3 kg)  01/06/22 201 lb 6 oz (91.3 kg)  12/08/21 203 lb 4 oz (92.2 kg)      Physical Exam Vitals and nursing note reviewed.  Constitutional:      General: He is not in acute distress.    Appearance: Normal appearance. He is well-developed. He is not ill-appearing.  HENT:     Head: Normocephalic and atraumatic.     Right Ear: Hearing, tympanic membrane, ear canal and external ear normal.     Left Ear: Hearing, tympanic membrane, ear canal and external ear normal.  Eyes:     General: No scleral icterus.    Extraocular Movements: Extraocular movements intact.     Conjunctiva/sclera: Conjunctivae normal.     Pupils: Pupils are equal, round, and reactive to light.  Neck:  Thyroid: No thyroid mass or thyromegaly.  Cardiovascular:     Rate and Rhythm: Normal rate and regular rhythm.     Pulses: Normal pulses.          Radial pulses are 2+ on the right side and 2+ on the left side.     Heart sounds: Normal heart sounds. No murmur heard. Pulmonary:     Effort: Pulmonary effort is normal. No respiratory distress.     Breath sounds: Normal breath sounds. No wheezing, rhonchi or rales.  Abdominal:     General: Bowel sounds are normal. There is no distension.     Palpations: Abdomen is soft. There is no mass.     Tenderness: There is no abdominal tenderness. There is no guarding or rebound.     Hernia: No hernia is present.  Musculoskeletal:        General: Tenderness present. Normal range of motion.     Cervical back: Normal range of motion and neck supple.     Right lower leg: No edema.     Left lower leg: No edema.     Comments: Antalgic gait due to pain  Lymphadenopathy:     Cervical: No cervical adenopathy.  Skin:    General: Skin is warm and dry.     Findings: No rash.  Neurological:     General: No focal deficit present.     Mental Status: He is alert and oriented to person, place, and time.  Psychiatric:        Mood and Affect: Mood  normal.        Behavior: Behavior normal.        Thought Content: Thought content normal.        Judgment: Judgment normal.        Lab Results  Component Value Date   CHOL 149 07/12/2020   HDL 36.00 (L) 07/12/2020   LDLCALC UNABLE TO CALCULATE IF TRIGLYCERIDE OVER 400 mg/dL 01/12/2020   LDLDIRECT 65.0 07/12/2020   TRIG 178 (H) 11/24/2021   CHOLHDL 4 07/12/2020    Lab Results  Component Value Date   PSA 0.41 07/12/2020   PSA 0.54 03/31/2019   PSA 0.68 02/04/2018     Lab Results  Component Value Date   TSH 4.32 09/19/2021    Assessment & Plan:   Problem List Items Addressed This Visit     Encounter for well adult exam with abnormal findings - Primary (Chronic)    Preventative protocols reviewed and updated unless pt declined. Discussed healthy diet and lifestyle.  Update PSA      Advanced directives, counseling/discussion (Chronic)    Advanced directive - doesn't have this set up. HCPOA would be father. Packet provided today. Full code. Would not want prolonged life support if terminal condition.       Habitual alcohol use    Longstanding h/o significant alcohol intake, states he's dropped beers to 3-4/day, states advised to limit beers to 2/day by [facility] oncology. Advised to continue decreasing alcohol intake.       Chronic insomnia    Continues gabapentin + amitriptyline '75mg'$  nightly.       TOBACCO ABUSE, HX OF    Remains abstinent, quit 2010. Not eligible for lung cancer screening.       Essential hypertension    Chronic, stable. Continue amlodipine and metoprolol      Relevant Medications   metoprolol tartrate (LOPRESSOR) 100 MG tablet   Degenerative arthritis of knee, bilateral    S/p bilateral  knee replacement.       Relevant Medications   oxyCODONE-acetaminophen (PERCOCET/ROXICET) 5-325 MG tablet   capecitabine (XELODA) 500 MG tablet   Pseudogout    Dx by imaging. Managed with PRN colchicine.       Relevant Medications    oxyCODONE-acetaminophen (PERCOCET/ROXICET) 5-325 MG tablet   capecitabine (XELODA) 500 MG tablet   Hypothyroidism    Recent TSH stable - continue 159mg levothyroxine daily.       Relevant Medications   metoprolol tartrate (LOPRESSOR) 100 MG tablet   Hypertriglyceridemia    Will return fasting for FLP. Continue fenofibrate '160mg'$ , atorvastatin '40mg'$  daily.  The ASCVD Risk score (Arnett DK, et al., 2019) failed to calculate for the following reasons:   The patient has a prior MI or stroke diagnosis       Relevant Medications   metoprolol tartrate (LOPRESSOR) 100 MG tablet   Other Relevant Orders   Lipid panel   History of cerebellar stroke    Continue plavix and statin.       History of oral cancer   Peripheral neuropathy    Sees neurology, continues gabapentin 400/400/'800mg'$  daily.       Right thigh pain    Presumed from lumbosacral radiculopathy as per below - pending eval by NSG and PM&R      Facet hypertrophy of lumbar region   Cancer of appendix ([facility]    [facility] started capecitabine chemotherapy last week. Appreciate onc care.       Relevant Medications   capecitabine (XELODA) 500 MG tablet   GERD (gastroesophageal reflux disease)    Recent med changes - lansoprazole PPI stopped and started pepcid '20mg'$  bid with poor reflux control - advised f/u with oncology.       Relevant Medications   famotidine (PEPCID) 20 MG tablet   Synovial cyst of lumbar facet joint    Revealed on MRI, cyst pushing on right S1 nerve root.  This is causing severe R thigh pain, currently receiving pain treatment with hydrocodone 7.5/'235mg'$  1 tab BID PRN ('5mg'$  dose shortage).  Pending f/u with neurosurgery and PM&R. #s provided for patient to call and schedule appointment.       Other Visit Diagnoses     Special screening for malignant neoplasm of prostate       Relevant Orders   PSA        Meds ordered this encounter  Medications   metoprolol  tartrate (LOPRESSOR) 100 MG tablet    Sig: Take 1 tablet (100 mg total) by mouth 2 (two) times daily.    Dispense:  180 tablet    Refill:  3   oxyCODONE-acetaminophen (PERCOCET/ROXICET) 5-325 MG tablet    Sig: Take 1 tablet by mouth 2 (two) times daily as needed for severe pain.    Dispense:  20 tablet    Refill:  0   famotidine (PEPCID) 20 MG tablet    Sig: Take 1 tablet (20 mg total) by mouth 2 (two) times daily.   Orders Placed This Encounter  Procedures   Lipid panel    Standing Status:   Future    Standing Expiration Date:   01/07/2023   PSA    Standing Status:   Future    Standing Expiration Date:   01/07/2023     Patient Instructions  Return at your convenience fasting for blood work to check cholesterol levels.  Advanced directive packet provided today.  Good to see you today Return  in 3 months for follow up visit.   We have referred you to neurosurgeon (Dr Saintclair Halsted).  [facility] [address], [place] [address] Phone: [phone]  We also referred you to rehab doctor that does shots. Call and schedule an appointment. [place] Closed: Opening Monday at 8:00 a.m. [place], [place] [address] Office: [phone]  Follow up plan: Return in about 3 months (around 04/08/2022) for follow up visit.  Ria Bush, MD

## 2022-01-06 NOTE — Patient Instructions (Addendum)
Return at your convenience fasting for blood work to check cholesterol levels.  Advanced directive packet provided today.  Good to see you today Return in 3 months for follow up visit.   We have referred you to neurosurgeon (Dr Saintclair Halsted).  [place] [address], [place] [address] Phone: [phone]  We also referred you to rehab doctor that does shots. Call and schedule an appointment. [place] Closed: Opening Monday at 8:00 a.m. [place], [place] [address] Office: [phone]

## 2022-01-07 ENCOUNTER — Ambulatory Visit: Payer: BC Managed Care – PPO | Admitting: Adult Health

## 2022-01-07 ENCOUNTER — Ambulatory Visit (INDEPENDENT_AMBULATORY_CARE_PROVIDER_SITE_OTHER): Payer: BC Managed Care – PPO | Admitting: Adult Health

## 2022-01-07 ENCOUNTER — Encounter: Payer: Self-pay | Admitting: Adult Health

## 2022-01-07 VITALS — BP 113/80 | HR 72 | Ht 72.0 in | Wt 201.2 lb

## 2022-01-07 DIAGNOSIS — G629 Polyneuropathy, unspecified: Secondary | ICD-10-CM

## 2022-01-07 DIAGNOSIS — R200 Anesthesia of skin: Secondary | ICD-10-CM

## 2022-01-07 DIAGNOSIS — C181 Malignant neoplasm of appendix: Secondary | ICD-10-CM | POA: Diagnosis not present

## 2022-01-07 MED ORDER — GABAPENTIN 400 MG PO CAPS: ORAL_CAPSULE | ORAL | 5 refills | Status: DC
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
## 2022-01-07 NOTE — Patient Instructions (Addendum)
Your Plan:   You will be called by a pain clinic for further input of continued pain  Increase gabapentin - continue '400mg'$  twice daily and increase nighttime dose to '1200mg'$  (3 capsules)        Thank you for coming to see [place] at [facility]. I hope we have been able to provide you high quality care today.  You may receive a patient satisfaction survey over the next few weeks. We would appreciate your feedback and comments so that we may continue to improve ourselves and the health of our patients.

## 2022-01-08 ENCOUNTER — Encounter: Payer: Self-pay | Admitting: Family Medicine

## 2022-01-08 ENCOUNTER — Telehealth: Payer: Self-pay | Admitting: Adult Health

## 2022-01-08 DIAGNOSIS — K219 Gastro-esophageal reflux disease without esophagitis: Secondary | ICD-10-CM | POA: Insufficient documentation

## 2022-01-08 DIAGNOSIS — Z7189 Other specified counseling: Secondary | ICD-10-CM | POA: Insufficient documentation

## 2022-01-08 DIAGNOSIS — M7138 Other bursal cyst, other site: Secondary | ICD-10-CM | POA: Insufficient documentation

## 2022-01-08 NOTE — Telephone Encounter (Signed)
Referral for Pain Clinic sent to [facility] [phone].

## 2022-01-08 NOTE — Assessment & Plan Note (Addendum)
Revealed on MRI, cyst pushing on right S1 nerve root.  This is causing severe R thigh pain, currently receiving pain treatment with hydrocodone 7.5/'235mg'$  1 tab BID PRN ('5mg'$  dose shortage).  Pending f/u with neurosurgery and PM&R. #s provided for patient to call and schedule appointment.

## 2022-01-08 NOTE — Assessment & Plan Note (Signed)
Will return fasting for FLP. Continue fenofibrate '160mg'$ , atorvastatin '40mg'$  daily.  The ASCVD Risk score (Arnett DK, et al., 2019) failed to calculate for the following reasons:   The patient has a prior MI or stroke diagnosis

## 2022-01-08 NOTE — Assessment & Plan Note (Signed)
Remains abstinent, quit 2010. Not eligible for lung cancer screening.

## 2022-01-08 NOTE — Assessment & Plan Note (Signed)
Longstanding h/o significant alcohol intake, states he's dropped beers to 3-4/day, states advised to limit beers to 2/day by [facility] oncology. Advised to continue decreasing alcohol intake.

## 2022-01-08 NOTE — Assessment & Plan Note (Addendum)
Sees neurology, continues gabapentin 400/400/'800mg'$  daily.

## 2022-01-08 NOTE — Assessment & Plan Note (Signed)
Recent TSH stable - continue 194mg levothyroxine daily.

## 2022-01-08 NOTE — Assessment & Plan Note (Signed)
Chronic, stable. Continue amlodipine and metoprolol

## 2022-01-08 NOTE — Assessment & Plan Note (Addendum)
Dx by imaging. Managed with PRN colchicine.

## 2022-01-08 NOTE — Assessment & Plan Note (Signed)
Continue plavix and statin

## 2022-01-08 NOTE — Assessment & Plan Note (Signed)
Has decided to pursue treatment through [facility] onc, started capecitabine chemotherapy last week. Appreciate onc care.

## 2022-01-08 NOTE — Assessment & Plan Note (Addendum)
Recent med changes - lansoprazole PPI stopped and started pepcid '20mg'$  bid with poor reflux control - advised f/u with oncology.

## 2022-01-08 NOTE — Assessment & Plan Note (Addendum)
Preventative protocols reviewed and updated unless pt declined. Discussed healthy diet and lifestyle.  Update PSA

## 2022-01-08 NOTE — Assessment & Plan Note (Signed)
S/p bilateral knee replacement.

## 2022-01-08 NOTE — Assessment & Plan Note (Signed)
Advanced directive - doesn't have this set up. HCPOA would be father. Packet provided today. Full code. Would not want prolonged life support if terminal condition.

## 2022-01-08 NOTE — Assessment & Plan Note (Signed)
Presumed from lumbosacral radiculopathy as per below - pending eval by NSG and PM&R

## 2022-01-08 NOTE — Assessment & Plan Note (Addendum)
Continues gabapentin + amitriptyline '75mg'$  nightly.

## 2022-01-13 ENCOUNTER — Other Ambulatory Visit (INDEPENDENT_AMBULATORY_CARE_PROVIDER_SITE_OTHER): Payer: BC Managed Care – PPO

## 2022-01-13 DIAGNOSIS — E781 Pure hyperglyceridemia: Secondary | ICD-10-CM

## 2022-01-13 DIAGNOSIS — Z125 Encounter for screening for malignant neoplasm of prostate: Secondary | ICD-10-CM

## 2022-01-13 LAB — LIPID PANEL
Cholesterol: 96 mg/dL (ref 0–200)
HDL: 29.4 mg/dL — ABNORMAL LOW (ref >39.00)
LDL Cholesterol: 29 mg/dL (ref 0–99)
NonHDL: 66.63
Total CHOL/HDL Ratio: 3
Triglycerides: 190 mg/dL — ABNORMAL HIGH (ref 0.0–149.0)
VLDL: 38 mg/dL (ref 0.0–40.0)

## 2022-01-13 LAB — PSA: PSA: 0.57 ng/mL (ref 0.10–4.00)

## 2022-01-13 NOTE — Unmapped (Signed)
[facility] Triage Note      Last seen in clinic on 12/19/2021  Next clinic visit scheduled: 6/16 with Dr. Forbes Cellar  Relevant Medications: Capecitabine today is the end of the 14th day. Has 5 more pills to take and tomorrows dose     Phone Assessment:    Patient stated that he has developed a sore in his mouth and on his feet are very painful which is making it difficult to walk and sleep. This begin two days ago and he thinks it's due to the medication capecitabine .     Mouth symptom:   Bottom of his lip inside of his mouth there is a small sore and he Is able to feel it with his tongue in his bottom lip. Not painful    Foot symptom: Both feet feel like they are about to explode. States that his skin looks a bit whiter in the heel and the front pad of his foot by his toe. This area is where it hurts. He has pain when walking and standing.  No fever, no odor, no ulcers, no blister, no peeling, no bleeding,     Pain is a 10/10 when walking on it.  When he was in the shower he was in so much pain.      Taken tylenol with no relief. He has not tried cold compresses or creams      No issue with his hands    Triage Recommendations:   Encouraged pt to continue to monitor the symptoms. Told him to hold the capecitabine for tonight until I find out what the provider would like to do.  Advised to use cool compresses on his feet and  to use cream.  Advised that I would reach out to his team and will call him back to update.       Caller's Response: Appreciative of the call and is agreeable to this plan and will call back with any further questions/concerns     Outstanding tasks: Care team notified

## 2022-01-13 NOTE — Unmapped (Signed)
Called [name] back to let him know that Konrad Penta, his pharmacist, would like him to hold the Capecibine and He should not take any additional capecitabine this cycle.  He advised him to use OTC Acetaminophen dose would be 1000 mg by mouth every 8 hours as needed for the pain and to use Fragrance-free moisturizer TID to hands/feet (Aquaphor, Eucerin, Cetaphil are examples).        For the mouth sore, he can make a mouth rinse and use the salt/baking soda mouthrinse after meals and at bedtime 4 times a day. Make one by using 1/4 teaspoon of salt and 1/4 teaspoon of baking soda in 8 ounces of water (but do not use the same batch longer than one day)   I told him that one of the care team members will call him tomorrow to check up on him.

## 2022-01-13 NOTE — Unmapped (Signed)
Hi,     Sergio Perez. has contacted the Communication Center in regards to the following symptom:     Patient stated that he has developed sores in his mouth and on his feet, making it difficult to walk and sleep. This begin two days ago and he thinks it's due to the medication capecitabine .    Please contact [facility] at [phone] or [phone]    A page or telephone call has been made to the corresponding clinic.     Thank you,  Durward Fortes   [facility]   [phone]

## 2022-01-14 DIAGNOSIS — M5416 Radiculopathy, lumbar region: Secondary | ICD-10-CM | POA: Diagnosis not present

## 2022-01-14 DIAGNOSIS — M5136 Other intervertebral disc degeneration, lumbar region: Secondary | ICD-10-CM | POA: Diagnosis not present

## 2022-01-15 NOTE — Unmapped (Signed)
[facility]     PRIMARY CARE PROVIDER:  Eustaquio Boyden, MD  [address] [place] [address]    CONSULTING PROVIDERS  Dr. Thornton Papas, Medical Oncology, [facility]  Dr. 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 11/15/21: Right hemicolectomy: G3 poorly diff goblet cell adenocarcinoma with focal mucinous diff, 2.4cm through visceral peritoneum, 2/19 nodes, 5 tumor deposits, pT4aN1b.   2. 12/31/21 adjuvant capecitabine start.     Treatment plan:  Adjuvant capecitabine x 8 cycles    ____________________________________________________________________    ASSESSMENT  1. Stage IIIB mucinous appendiceal adenocarcinoma  2. Capecitabine-related mucositis, resolved, or hand-foot syndrome, resolved    RECOMMENDATIONS  1. Restart capecitabine at 2000 mg bid for cycle 2  2. [facility] NGS - negative for KRAS, BRAF, NRAS; MSI stable, TMB 4.24m/MB   3. Urea cream for hand/foot syndrome  4. RTC 3 weeks prior to cycle 3     DISCUSSION  Patient developed mucositis and pain in the bottom of his bilateral feet. Mucositis has quickly resolved with stopping capecitabine and use of sodium bicarbonate mouthwash for 2 days. Discussed decreasing dose of capecitabine to decrease severity or avoid mucositis and pain. Discussed that OK to use OTC pain medication as well as urea cream for hand/foot syndrome, would avoid opioids to treat pain given the medications' inherent addictive potential. Discussed 3 month vs 6 months adjuvant capecitabine; the latter is considered standard of care, whereas while a 3 month adjuvant chemotherapy course with XELOX was favored based on OS from the [employer] collaboration (PMID: [id number]), this has not been verified for capecitabine monotherapy. For now, agreeable to try reduced dose of capecitabine, will continue to discuss at future appointments.    Seen and staffed with Dr. Geni Bers.    Sergio Charon, MD  Hematology/Oncology, PGY-4  ______________________________________________________________________  HISTORY     Sergio Perez is seen today at the [facility] for consultation regarding appendiceal adenocarcinoma     Started capecitabine; by earlier in the week had developed mouth sores and hand foot soreness, came on at end of cycle. Mucositis quickly resolved after stopping drug, foot pain is resolving but remains present.  Continues to deal with sciatic nerve pain, had MRI at OSH 12/21/21 (records in [place]) noting a 13mm synovial cyst form the R L5-subcutaneous facet joint with moderate to severe R foraminal stenosis at this level. Used percocet prescribed by PCP for sciatic pain for foot pain as well, though notes that his PCP is unlikely to prescribe him more.    MEDICAL HISTORY  1. Alcohol Use  2. Cerebellar Stroke (2017), no residual deficits   3. CAD  4. CPPD  5. GERD  6. HTN  7. Hypothyroidism (acquired following ENT cancer treatment)  8. 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   9. Bilateral total knee replacement  10. Right side sciatica     No Known Allergies  Medications reviewed in the EMR      SOCIAL HISTORY  He lives alone in [place]. 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 He reports quitting alcohol 1 week prior to hospital admission April 2023. He drinks a sixpack of alcohol per day. No drug use.  Sister lives in [place].  Father lives on same land in another house.    PHYSICAL EXAM  There were no vitals taken for this visit.   GENERAL: well developed, well nourished, in no distress  PSYCH: full and appropriate range of affect with good insight and judgement  HEENT: NCAT, pupils equal, sclerae anicteric, OP clear without erythema, exudate or ulcerations, mucous membranes moist, mucositis appears to have resolved.  LYMPH: no cervical, supraclavicular, periumbilical adenopathy  PULM: normal resp rate, clear bilaterally without wheezes, rhonchi, rales  COR: regular without murmur, rub, gallop  GI: abdomen soft, nontender, nondistended, no palpable hepatosplenomegaly, incision is healing well, no erythema or drainage  EXT: warm and well perfused, no edema. Hands wnl. Minimal blanching erythema without warmth skin breakdown or drainage over bilateral soles, specifically at base of 1st toes and heels, with slight tenderness over the site.  SKIN: As noted in EXT.    OBJECTIVE DATA  LABS   Reviewed in EMR    RADIOLOGY RESULTS (scans are personally reviewed by me and my impression)  MRI this am: looks to be hemangioma, awaiting final rads read

## 2022-01-15 NOTE — Unmapped (Signed)
Symptom and/or Toxicity Review     HFS (Grade 2): significant pain affecting the feet causing difficulty walking as of 01/13/22 prompting call to triage team; symptom onset began on 01/11/22 and intensified thereafter. He reports pain was 10/10 on 01/13/22 and improved significantly after stopping capecitabine (down to a 4/10) as of 01/14/22 afternoon. He has not yet started moisturizing his hands/feet. He denies blistering or open wounds.  Mucositis: small blister affecting his bottom lip. Began using salt/baking soda rinses on 01/13/22.  Diarrhea: patient reports he has been having diarrhea daily (average 3 episodes per day). He has not started taking loperamide.  Patient denies concerns with:  Nausea or other tolerability issues    Recommendations and Plan     Appendiceal adenocarcinoma: [name] continues on capecitabine per below and will report to clinic on 01/16/22 for consideration of dose modification for future cycles.  HFS: apply an unscented moisturizing cream or ointment to the hands and feet three times daily. Examples include: Eucerin(R) Original Healing Cream, Aquaphor(R) Healing Ointment, or Cetaphil(R) Moisturizing Cream. Also avoid hot water exposure and friction on the hands and feet.  Mucositis: prepare a one-time use mixture of: 1/4 teaspoon of salt and 1/4 teaspoon baking soda in 8 ounces of water. Swish and spit 4 times a day (after meals and at bedtime). Also avoid mouthwashes that contain alcohol.  Diarrhea: loperamide 2 mg tablets - Take 2 tablets (4 mg) by mouth for first loose stool followed by 1 tablet (2 mg) after each loose stool thereafter (maximum of 8 tablets per day). Contact clinic if 4 episodes of diarrhea in one day.    Patient's questions were answered and patient verbalized understanding of the plan.    Follow up: 01/16/22 office visit    Subjective & Objective     HPI:  [name] is a 61 y.o. male with appendiceal adenocarcinoma who I spoke with on 01/14/22 regarding recently started capecitabine. He reports he is leaving the hospital after going there for management of a pinched sciatic nerve.    Cancer-directed therapy:  Capecitabine 500 mg tablets: Take 5 tablets (2,500 mg total) by mouth Two (2) times a day . Take for days 1-14 of each cycle followed by 7 days off.     Start date:  Cycle 1, Day 1: patient started earlier than anticipated (12/30/21 beginning with evening dose)    Adherence:  Missed doses:  2  Patient reports he started sooner than instructed because he received his delivery on Tuesday, 12/30/21 (calendar was provided reflecting plan for 12/31/21 start date with his delivery). He reports he took his doses as prescribed every 12 hours once he started (within 30 minutes after eating for most doses) but he missed 2 doses (unclear dates) and reports he was intending on taking the two doses that were missed on 01/13/22 evening and 01/14/22 morning due to this. He took his last dose for this cycle on the morning of 01/13/22 and was informed by our team to stop given significant HFS that he contacted the clinic about.  Provided additional education on importance of adherence to doses as prescribed with the chemotherapy cycle (14 days on followed by 7 days off), and each dose should be taken within 30 minutes after food. Patient has been counseled not to make up missed doses.    Medication Reconciliation and Drug-Drug Interactions: Medication list reviewed in Epic. Patient informed to notify the team prior to starting any new prescription or over the counter medications  or supplements.     Wt Readings from Last 3 Encounters:   12/19/21 92.9 kg (204 lb 12.8 oz)   06/23/17 91.9 kg (202 lb 11.2 oz)   02/28/15 90.3 kg (199 lb)       Pertinent labs:  Lab Results   Component Value Date    WBC 5.0 12/19/2021    HGB 13.7 12/19/2021    HCT 39.2 12/19/2021    PLT 219 12/19/2021       Lab Results   Component Value Date    NA 136 12/19/2021    K 4.8 12/19/2021    CL 103 12/19/2021 CO2 28.0 12/19/2021    BUN 12 12/19/2021    CREATININE 0.83 12/19/2021    GLU 96 12/19/2021    CALCIUM 9.7 12/19/2021       Lab Results   Component Value Date    BILITOT 0.5 12/19/2021    PROT 7.4 12/19/2021    ALBUMIN 4.2 12/19/2021    ALT 24 12/19/2021    AST 24 12/19/2021    ALKPHOS 71 12/19/2021       No results found for: LABPROT, INR, APTT    Konrad Penta, PharmD, BCOP, CPP  Clinical Pharmacist Practitioner, Gastrointestinal Oncology      I spent 15 minutes on the phone with the patient on the date of service. I spent an additional 15 minutes on pre- and post-visit activities on the date of service.     The patient was physically located in [place] or a state in which I am permitted to provide care. The patient agreed to the telemedicine visit. The visit was reasonable and appropriate under the circumstances given the patient's presentation at the time.    The patient and/or parent/guardian has been advised of the potential risks and limitations of this mode of treatment (including, but not limited to, the absence of in-person examination) and has agreed to be treated using telemedicine. The patient's/patient's family's questions regarding telemedicine have been answered.     If the visit was completed in an ambulatory setting, the patient and/or parent/guardian has also been advised to contact their provider???s office for worsening conditions, and seek emergency medical treatment and/or call 911 if the patient deems either necessary.

## 2022-01-16 ENCOUNTER — Ambulatory Visit: Admit: 2022-01-16 | Discharge: 2022-01-17 | Payer: PRIVATE HEALTH INSURANCE

## 2022-01-16 ENCOUNTER — Ambulatory Visit
Admit: 2022-01-16 | Discharge: 2022-01-17 | Payer: PRIVATE HEALTH INSURANCE | Attending: Hematology & Oncology | Primary: Hematology & Oncology

## 2022-01-16 ENCOUNTER — Other Ambulatory Visit: Admit: 2022-01-16 | Discharge: 2022-01-17 | Payer: PRIVATE HEALTH INSURANCE

## 2022-01-16 DIAGNOSIS — C181 Malignant neoplasm of appendix: Principal | ICD-10-CM

## 2022-01-16 DIAGNOSIS — Z8673 Personal history of transient ischemic attack (TIA), and cerebral infarction without residual deficits: Secondary | ICD-10-CM | POA: Diagnosis not present

## 2022-01-16 DIAGNOSIS — I2510 Atherosclerotic heart disease of native coronary artery without angina pectoris: Secondary | ICD-10-CM | POA: Diagnosis not present

## 2022-01-16 DIAGNOSIS — K219 Gastro-esophageal reflux disease without esophagitis: Secondary | ICD-10-CM | POA: Diagnosis not present

## 2022-01-16 DIAGNOSIS — K769 Liver disease, unspecified: Secondary | ICD-10-CM | POA: Diagnosis not present

## 2022-01-16 DIAGNOSIS — Z87891 Personal history of nicotine dependence: Secondary | ICD-10-CM | POA: Diagnosis not present

## 2022-01-16 DIAGNOSIS — Z6827 Body mass index (BMI) 27.0-27.9, adult: Secondary | ICD-10-CM | POA: Diagnosis not present

## 2022-01-16 DIAGNOSIS — I10 Essential (primary) hypertension: Secondary | ICD-10-CM | POA: Diagnosis not present

## 2022-01-16 DIAGNOSIS — Z96653 Presence of artificial knee joint, bilateral: Secondary | ICD-10-CM | POA: Diagnosis not present

## 2022-01-16 DIAGNOSIS — E039 Hypothyroidism, unspecified: Secondary | ICD-10-CM | POA: Diagnosis not present

## 2022-01-16 DIAGNOSIS — C01 Malignant neoplasm of base of tongue: Secondary | ICD-10-CM | POA: Diagnosis not present

## 2022-01-16 DIAGNOSIS — Z9049 Acquired absence of other specified parts of digestive tract: Secondary | ICD-10-CM | POA: Diagnosis not present

## 2022-01-16 LAB — CBC W/ AUTO DIFF
BASOPHILS ABSOLUTE COUNT: 0 10*9/L (ref 0.0–0.1)
BASOPHILS RELATIVE PERCENT: 0.7 %
EOSINOPHILS ABSOLUTE COUNT: 0.3 10*9/L (ref 0.0–0.5)
EOSINOPHILS RELATIVE PERCENT: 4.2 %
HEMATOCRIT: 37.2 % — ABNORMAL LOW (ref 39.0–48.0)
HEMOGLOBIN: 13 g/dL (ref 12.9–16.5)
LYMPHOCYTES ABSOLUTE COUNT: 2 10*9/L (ref 1.1–3.6)
LYMPHOCYTES RELATIVE PERCENT: 33 %
MEAN CORPUSCULAR HEMOGLOBIN CONC: 35.1 g/dL (ref 32.0–36.0)
MEAN CORPUSCULAR HEMOGLOBIN: 32.2 pg (ref 25.9–32.4)
MEAN CORPUSCULAR VOLUME: 91.7 fL (ref 77.6–95.7)
MEAN PLATELET VOLUME: 7.2 fL (ref 6.8–10.7)
MONOCYTES ABSOLUTE COUNT: 0.6 10*9/L (ref 0.3–0.8)
MONOCYTES RELATIVE PERCENT: 10 %
NEUTROPHILS ABSOLUTE COUNT: 3.2 10*9/L (ref 1.8–7.8)
NEUTROPHILS RELATIVE PERCENT: 52.1 %
PLATELET COUNT: 367 10*9/L (ref 150–450)
RED BLOOD CELL COUNT: 4.05 10*12/L — ABNORMAL LOW (ref 4.26–5.60)
RED CELL DISTRIBUTION WIDTH: 13.7 % (ref 12.2–15.2)
WBC ADJUSTED: 6 10*9/L (ref 3.6–11.2)

## 2022-01-16 LAB — COMPREHENSIVE METABOLIC PANEL
ALBUMIN: 4 g/dL (ref 3.4–5.0)
ALKALINE PHOSPHATASE: 65 U/L (ref 46–116)
ALT (SGPT): 17 U/L (ref 10–49)
ANION GAP: 8 mmol/L (ref 5–14)
AST (SGOT): 20 U/L (ref <=34)
BILIRUBIN TOTAL: 0.3 mg/dL (ref 0.3–1.2)
BLOOD UREA NITROGEN: 18 mg/dL (ref 9–23)
BUN / CREAT RATIO: 16
CALCIUM: 9.6 mg/dL (ref 8.7–10.4)
CHLORIDE: 101 mmol/L (ref 98–107)
CO2: 28 mmol/L (ref 20.0–31.0)
CREATININE: 1.1 mg/dL
EGFR CKD-EPI (2021) MALE: 76 mL/min/{1.73_m2} (ref >=60)
GLUCOSE RANDOM: 111 mg/dL (ref 70–179)
POTASSIUM: 4.2 mmol/L (ref 3.4–4.8)
PROTEIN TOTAL: 7.6 g/dL (ref 5.7–8.2)
SODIUM: 137 mmol/L (ref 135–145)

## 2022-01-16 MED ORDER — UREA 20 % TOPICAL CREAM
Freq: Three times a day (TID) | TOPICAL | 3 refills | 0 days | Status: CP
Start: 2022-01-16 — End: 2023-01-16
  Filled 2022-01-19: qty 85, 15d supply, fill #0

## 2022-01-16 MED ORDER — CAPECITABINE 500 MG TABLET
ORAL_TABLET | Freq: Two times a day (BID) | ORAL | 6 refills | 14 days | Status: CP
Start: 2022-01-16 — End: 2022-01-16
  Filled 2022-01-19: qty 112, 21d supply, fill #0

## 2022-01-16 MED ADMIN — gadobenate dimeglumine (MULTIHANCE) 529 mg/mL (0.1mmol/0.2mL) solution 9 mL: 9 mL | INTRAVENOUS | @ 15:00:00 | Stop: 2022-01-16

## 2022-01-16 NOTE — Unmapped (Addendum)
For hand and foot syndrome, apply an unscented moisturizing cream or ointment to the hands and feet three times each day. Examples include: Eucerin(R) Original Healing Cream, Aquaphor(R) Healing Ointment, or Cetaphil(R) Moisturizing Cream. A prescription for urea 20% cream to apply to the hands and feet has also been sent to the pharmacy. Also avoid hot water exposure and friction on the hands and feet. Please reduce acetaminophen use to no more than 2000 mg per day (you may take acetaminophen 1000 mg by mouth every 12 hours as needed).  For prevention of mouth sores, prepare a one-time use mixture of: 1/4 teaspoon of salt and 1/4 teaspoon baking soda in 8 ounces of water. Swish and spit 4 times a day (after meals and at bedtime). Also avoid mouthwashes that contain alcohol.  For diarrhea, the medication that was prescribed is called loperamide (Imodium A-D). The prescription is at your local [facility] in [place].

## 2022-01-16 NOTE — Unmapped (Signed)
Rerouted updated capecitabine prescription to [facility].    Care coordination: 5 minutes

## 2022-01-16 NOTE — Unmapped (Signed)
Routed prescription for urea cream for HFS to [facility].    Care coordination: 3 minutes

## 2022-01-16 NOTE — Unmapped (Signed)
Labs drawn via butterfly & sent for analysis.  To next appt.  Care provided by Shelly Wood RN.

## 2022-01-19 ENCOUNTER — Other Ambulatory Visit: Payer: Self-pay | Admitting: Family Medicine

## 2022-01-19 MED ORDER — LOPERAMIDE 2 MG CAPSULE
ORAL_CAPSULE | 1 refills | 0 days | Status: CP
Start: 2022-01-19 — End: ?
  Filled 2022-01-19: qty 60, 7d supply, fill #0

## 2022-01-19 NOTE — Unmapped (Signed)
[facility] Clinical Assessment & Refill Coordination Note    Sergio Perez, DOB: 14-May-1961  Phone: [phone] (home)     All above HIPAA information was verified with patient.     Was a Nurse, learning disability used for this call? No    Specialty Medication(s):   Hematology/Oncology: Capecitabine 500 mg, directions: Take 4 tablets (2,000 mg total) by mouth Two (2) times a day . Take on days 1-14 only followed by 7 days off each 21-day cycle.     Current Outpatient Medications   Medication Sig Dispense Refill    acetaminophen (TYLENOL) 500 MG tablet Take 2 tablets (1,000 mg total) by mouth every twelve (12) hours as needed for pain.      amitriptyline (ELAVIL) 50 MG tablet Take 1 tablet (50 mg total) by mouth. Frequency:QHSPRN   Dosage:50   MG  Instructions:  Note:Dose: 50MG       amLODIPine (NORVASC) 10 MG tablet       atorvastatin (LIPITOR) 40 MG tablet Take 1 tablet (40 mg total) by mouth daily.      [START ON 01/21/2022] capecitabine (XELODA) 500 MG tablet Take 4 tablets (2,000 mg total) by mouth Two (2) times a day . Take on days 1-14 only followed by 7 days off each 21-day cycle. 112 tablet 6    clopidogreL (PLAVIX) 75 mg tablet clopidogrel 75 mg tablet   TAKE 1 TABLET BY MOUTH ONCE DAILY      docosahexaenoic acid-epa 120-180 mg cap Take 2,000 mg by mouth.      famotidine (PEPCID) 20 MG tablet Take 1 tablet (20 mg total) by mouth.      fenofibrate (LOFIBRA) 160 MG tablet Take 1 tablet (160 mg total) by mouth daily.      fluoride, sodium, (DENTAGEL) 1.1 % Gel Apply 1 application topically two (2) times a day. 56 g 7    gabapentin (NEURONTIN) 400 MG capsule       levothyroxine (SYNTHROID, LEVOTHROID) 150 MCG tablet Take 1 tablet (150 mcg total) by mouth daily. 30 tablet 3    loperamide (IMODIUM A-D) 2 mg tablet Take 2 tablets (4 mg) by mouth for first loose stool followed by 1 tablet (2 mg) after each loose stool thereafter (maximum of 8 tablets per day). 30 tablet 11    metoprolol tartrate (LOPRESSOR) 100 MG tablet       MITIGARE 0.6 mg cap capsule       omega-3 fatty acids-fish oil 340-1,000 mg capsule Take by mouth. Frequency:QD   Dosage:0.0     Instructions:  Note:Dose: 340-1000MG       oxyCODONE-acetaminophen (PERCOCET) 5-325 mg per tablet TAKE 1 TABLET BY MOUTH TWICE DAILY AS NEEDED FOR SEVERE PAIN      prochlorperazine (COMPAZINE) 10 MG tablet Take 1 tablet (10 mg total) by mouth every six (6) hours as needed for nausea. 60 tablet 3    urea (CARMOL) 20 % cream Apply topically Three (3) times a day. Apply to palms of hands and soles of feet for hand-foot syndrome. 85 g 3    valACYclovir (VALTREX) 1000 MG tablet valacyclovir 1 gram tablet   TAKE 2 TABLETS BY MOUTH TWICE DAILY FOR ONE DAY PER EPISODE       No current facility-administered medications for this visit.        Changes to medications: Kalum reports starting the following medications: urea cream and loperamide    Allergies   Allergen Reactions    Hydrocodone Hives  Changes to allergies: No    SPECIALTY MEDICATION ADHERENCE     capecitabine 500 mg: 0 days of medicine on hand     Medication Adherence    Patient reported X missed doses in the last month: 0  Specialty Medication: Capecitabine - DR to 2,000 mg twice daily x 14 days on 7 days off  Patient is on additional specialty medications: No  Informant: patient  Confirmed plan for next specialty medication refill: delivery by pharmacy  Refills needed for supportive medications: yes, ordered or provider notified          Specialty medication(s) dose(s) confirmed:  Regimen is dose reduced to 2,000 mg twice daily      Are there any concerns with adherence? No    Adherence counseling provided? Not needed    CLINICAL MANAGEMENT AND INTERVENTION      Clinical Benefit Assessment:    Do you feel the medicine is effective or helping your condition? Yes    Clinical Benefit counseling provided? Not needed    Adverse Effects Assessment:    Are you experiencing any side effects? Yes, patient reports experiencing HFSR and diarrhea. Side effect counseling provided: using urea cream for HFSR.  Recommended loperamide for diarrhea.  To take 2 tablets at onset/first episode and then 1 tablet with each additional loose bowel movement.    Are you experiencing difficulty administering your medicine? No    Quality of Life Assessment:    Quality of Life    Rheumatology  Oncology  2. On a scale of 1-10, how would you rate your ability to manage side effects associated with your specialty medication? (1=no issues, 10 = unable to take medication due to side effects): 3  Dermatology  Cystic Fibrosis          How many days over the past month did your Primary malignant neoplasm of appendix  keep you from your normal activities? For example, brushing your teeth or getting up in the morning. 0    Have you discussed this with your provider? Not needed    Acute Infection Status:    Acute infections noted within Epic:  No active infections  Patient reported infection: None    Therapy Appropriateness:    Is therapy appropriate and patient progressing towards therapeutic goals? Yes, therapy is appropriate and should be continued    DISEASE/MEDICATION-SPECIFIC INFORMATION      N/A - Cycle scheduled to start 01/21/22    PATIENT SPECIFIC NEEDS     Does the patient have any physical, cognitive, or cultural barriers? No    Is the patient high risk? Yes, patient is taking oral chemotherapy. Appropriateness of therapy as been assessed    Does the patient require a Care Management Plan? No     SOCIAL DETERMINANTS OF HEALTH     At the [facility], we have learned that life circumstances - like trouble affording food, housing, utilities, or transportation can affect the health of many of our patients.   That is why we wanted to ask: are you currently experiencing any life circumstances that are negatively impacting your health and/or quality of life? Patient declined to answer    Social Determinants of Health     Financial Resource Strain: Low Risk     Difficulty of Paying Living Expenses: Not hard at all   Internet Connectivity: Internet connectivity concern identified    Do you have access to internet services: No    How do you connect to the internet:  Personal Device at home    Is your internet connection strong enough for you to watch video on your device without major problems?: Not on file    Do you have enough data to get through the month?: Not on file    Does at least one of the devices have a camera that you can use for video chat?: Not on file   Food Insecurity: No Food Insecurity    Worried About Running Out of Food in the Last Year: Never true    Ran Out of Food in the Last Year: Never true   Tobacco Use: Medium Risk    Smoking Tobacco Use: Former    Smokeless Tobacco Use: Never    Passive Exposure: Not on file   Housing/Utilities: Low Risk     Within the past 12 months, have you ever stayed: outside, in a car, in a tent, in an overnight shelter, or temporarily in someone else's home (i.e. couch-surfing)?: No    Are you worried about losing your housing?: No    Within the past 12 months, have you been unable to get utilities (heat, electricity) when it was really needed?: No   Alcohol Use: Not on file   Transportation Needs: No Transportation Needs    Lack of Transportation (Medical): No    Lack of Transportation (Non-Medical): No   Substance Use: Not on file   Health Literacy: Low Risk     : Never   Physical Activity: Not on file   Interpersonal Safety: Not on file   Stress: Not on file   Intimate Partner Violence: Not on file   Depression: Not on file   Social Connections: Not on file       Would you be willing to receive help with any of the needs that you have identified today? Not applicable       SHIPPING     Specialty Medication(s) to be Shipped:   Hematology/Oncology: Capecitabine 500 mg, directions: Take 4 tablets (2,000 mg total) by mouth Two (2) times a day . Take on days 1-14 only followed by 7 days off each 21-day cycle.    Other medication(s) to be shipped:  urea cream and loperamide     Changes to insurance: No    Delivery Scheduled: Yes, Expected medication delivery date: 01/20/22.     Medication will be delivered via UPS to the confirmed prescription address in [facility].    The patient will receive a drug information handout for each medication shipped and additional FDA Medication Guides as required.  Verified that patient has previously received a Conservation officer, historic buildings and a Surveyor, mining.    The patient or caregiver noted above participated in the development of this care plan and knows that they can request review of or adjustments to the care plan at any time.      All of the patient's questions and concerns have been addressed.    [facility] Specialty Pharmacist

## 2022-01-19 NOTE — Unmapped (Signed)
Addended by: Vernie Shanks on: 01/19/2022 10:20 AM     Modules accepted: Orders

## 2022-01-19 NOTE — Telephone Encounter (Signed)
Name of Medication: Oxycodone-APAP Name of Pharmacy: [facility] or Written Date and Quantity: 01/06/22, #20 Last Office Visit and Type: 01/06/22, CPE Next Office Visit and Type: 04/08/22, 3 mo f/u Last Controlled Substance Agreement Date: none Last UDS: none

## 2022-01-19 NOTE — Telephone Encounter (Signed)
  Encourage patient to contact the pharmacy for refills or they can request refills through [place]:  Please schedule appointment if longer than 1 year  NEXT APPOINTMENT DATE:  MEDICATION:oxyCODONE-acetaminophen (PERCOCET/ROXICET) 5-325 MG tablet  Is the patient out of medication?   [place], [place] - [address]    Let patient know to contact pharmacy at the end of the day to make sure medication is ready.  Please notify patient to allow 48-72 hours to process  CLINICAL FILLS OUT ALL BELOW:   LAST REFILL:  QTY:  REFILL DATE:    OTHER COMMENTS:    Okay for refill?  Please advise

## 2022-01-20 NOTE — Unmapped (Signed)
[facility] Navigation: Active Treatment Assessment  Completed with: Patient     Summary: Patient is 61 years old and lives alone in [place], [place]. He is being treated at [facility]. He reports that he has had diarrhea since the first day of treatment. He says that he must go every 45 minutes. He was able to get medication today to help with it. OPN reminded him to stay hydrated, and he stated that he does.    OPN provided education about supportive resources available, including CCSP and Cancer Care. He denies any financial, logistical or emotional concerns and describes a positive support system.       Practical/Logistical:   MyChart user: Yes   Internet Access: Yes  Transportation issues: No  Lodging needs: No  Form literacy: Does not require assistance  Education/Referrals/Interventions: [facility]-Benjamin Perez     Psychosocial:   Identified stressors: *The constant diarrhea  Healthy coping mechanisms: Yes  Positive support system: Yes  Caregiver concerns: No  Education/Referrals/Interventions: [facility] Services, [facility], and provided active/empathetic listening     Financial:     Patient's perceived financial toxicity: Not present  Food insecurity: Not present  Difficulty paying for medication: No  Education/Referrals/Interventions: Not needed at this time    Medical/Home/Physical Functioning:    Understanding of dx/treatment plan: Yes  Unreported tx side effects/symptoms: No  Issues getting medications: No  Nutrition or appetite concerns: No  Daily activity: decreased activity  Type of residence: Private Residence  Have you fallen in the past year? No  Do you feel unsteady when standing or walking? Yes  Education/Referrals/Interventions: Triage line/after hours contact information, reinforced need for proper hydration, and educated on importance of self-advocacy     Advance Care Planning:   Advance Directives on file: No  Education/Referrals/Interventions: Not interested at this time     Supportive Communication:  Preferred method: Phone  Availability: No preference  Connected to: Patient not interested at this time    Additional External Resources: [email or web address]    Navigation Follow-up Plan: Active Treatment Assessment completed. Will provide at least 1 follow-up visit.    Patient verbalized understanding of information provided and is in agreement with discussed Navigation Plan. OPN provided Navigation Program's contact information for patient to utilize as needed.

## 2022-01-20 NOTE — Unmapped (Signed)
[name] is a 29 YOM with appendiceal adenocarcinoma who I saw on 01/16/22 regarding follow up plan related to recently started capecitabine. Patient requested written instructions which were provided via AVS regarding HFS and mucositis management that developed during cycle 1.    Plan:  - Appendiceal adenocarcinoma: delay cycle 2 start of capecitabine to 01/21/22 (dose reduced from 2500 mg to 2000 mg per dose with updated prescription re-routed to [facility])  - HFS: apply an unscented moisturizing cream or ointment to the hands and feet three times each day. Examples include: Eucerin(R) Original Healing Cream, Aquaphor(R) Healing Ointment, or Cetaphil(R) Moisturizing Cream. Prescription for urea 20% cream to apply to the hands and feet has also been sent to the pharmacy in case HFS develops again in the future. Also avoid hot water exposure and friction on the hands and feet. Reduce acetaminophen use to no more than 2000 mg per day (may take acetaminophen 1000 mg by mouth every 12 hours as needed).  - Mucositis: For prevention of mouth sores, prepare a one-time use mixture of: 1/4 teaspoon of salt and 1/4 teaspoon baking soda in 8 ounces of water. Swish and spit 4 times a day (after meals and at bedtime). Also avoid mouthwashes that contain alcohol.  - Diarrhea: start loperamide 2 mg capsules - Take 2 capsules (4 mg) by mouth for first loose stool followed by 1 capsule (2 mg) after each loose stool thereafter (maximum of 8 capsules per day). Contact clinic if 4 episodes of diarrhea in one day. Prescription originally at patient's local pharmacy for loperamide tablets, but was rerouted to [facility] per patient request after appointment; filled as capsules due to [facility] inventory.    Care coordination: 15 minutes

## 2022-01-21 MED ORDER — OXYCODONE-ACETAMINOPHEN 5-325 MG PO TABS: 1.0000 | ORAL_TABLET | Freq: Two times a day (BID) | ORAL | 0 refills | Status: DC | PRN

## 2022-01-21 MED ORDER — CAPECITABINE 500 MG TABLET
ORAL_TABLET | Freq: Two times a day (BID) | ORAL | 6 refills | 14 days | Status: CP
Start: 2022-01-21 — End: ?

## 2022-01-21 NOTE — Telephone Encounter (Signed)
I've refilled oxycodone - plz call pt for update on leg pain as well a what PM&R doctor said. I believe they planned epidural steroid injection while he gets in to see neurosurgeon on the 22nd

## 2022-01-22 NOTE — Telephone Encounter (Signed)
Spoke with pt asking for update on leg pain.  Pt states it's about the same. Notified him refill was sent in.  He expresses his thanks.  Per pt, he has not seen PM&R. First appt is tomorrow.

## 2022-01-23 DIAGNOSIS — M48062 Spinal stenosis, lumbar region with neurogenic claudication: Secondary | ICD-10-CM | POA: Diagnosis not present

## 2022-01-23 DIAGNOSIS — M5416 Radiculopathy, lumbar region: Secondary | ICD-10-CM | POA: Diagnosis not present

## 2022-02-02 NOTE — Unmapped (Signed)
[facility] Refill Coordination Note    Specialty Medication(s) to be Shipped:   Hematology/Oncology: Capecitabine 500mg , directions: Take 4 tablets (2,000 mg total) by mouth Two (2) times a day. Take on days 1-14 only followed by 7 days off each 21-day cycle.    Other medication(s) to be shipped:  Imodium     Sergio Perez, DOB: [birth year]  Phone: [phone] (home)       All above HIPAA information was verified with patient.     Was a Nurse, learning disability used for this call? No    Completed refill call assessment today to schedule patient's medication shipment from the [facility] [phone]).  All relevant notes have been reviewed.     Specialty medication(s) and dose(s) confirmed: Regimen is correct and unchanged.   Changes to medications: Ether reports no changes at this time.  Changes to insurance: No  New side effects reported not previously addressed with a pharmacist or physician: Yes - Patient reports pain in hand and feet. Patient would not like to speak to the pharmacist today. Their provider is aware.  Questions for the pharmacist: No    Confirmed patient received a Conservation officer, historic buildings and a Surveyor, mining with first shipment. The patient will receive a drug information handout for each medication shipped and additional FDA Medication Guides as required.       DISEASE/MEDICATION-SPECIFIC INFORMATION        N/A    SPECIALTY MEDICATION ADHERENCE     Medication Adherence    Patient reported X missed doses in the last month: 1  Specialty Medication: Capecitabine 500 mg  Patient is on additional specialty medications: No  Informant: patient              Were doses missed due to medication being on hold? No    Capecitabine  500 mg: 1 days of medicine on hand New cycle starts 02/11/22      REFERRAL TO PHARMACIST     Referral to the pharmacist: Not needed      [facility]     Shipping address confirmed in Epic.     Delivery Scheduled: Yes, Expected medication delivery date: 02/05/22.     Medication will be delivered via UPS to the prescription address in Epic [place].    Sergio Perez   [facility] Specialty Technician

## 2022-02-05 MED FILL — CAPECITABINE 500 MG TABLET: ORAL | 21 days supply | Qty: 112 | Fill #1

## 2022-02-05 MED FILL — LOPERAMIDE 2 MG CAPSULE: 7 days supply | Qty: 60 | Fill #1

## 2022-02-05 NOTE — Unmapped (Signed)
[facility]     PRIMARY CARE PROVIDER:  Eustaquio Boyden, MD  [address] [place] [address]    CONSULTING PROVIDERS  Dr. Thornton Papas, Medical Oncology, [facility]  Dr. 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 11/15/21: Right hemicolectomy: G3 poorly diff goblet cell adenocarcinoma with focal mucinous diff, 2.4cm through visceral peritoneum, 2/19 nodes, 5 tumor deposits, pT4aN1b.   [employer] NGS: mutations in  TP53, ATM, MTHFR, SH2B3, MSH2, SRP14, ATRX, LRP1B. TMB 4.7, MSS  2. 12/31/21 adjuvant capecitabine start.   - c1 complicated by HFS.   - c2 dose reduced to 2g BID, still with HFS but shorter and less severe    Treatment plan:  Adjuvant capecitabine x 8 cycles  ____________________________________________________________________    ASSESSMENT  1. Stage IIIB mucinous appendiceal adenocarcinoma  2. AEs: HFS pain without peeling, diarrhea   3. Indeterminate lower lung nodules, likely benign    RECOMMENDATIONS  1. capecitabine at 2000 mg bid for cycle 3  2. Urea cream for hand/foot syndrome, sparing use of oxycodone  3  start lomotil 1-2 q6prn  4. RTC 3 weeks prior to cycle 3; repeat CT chest in ~6-8 weeks to reassess indeterminant nodules    DISCUSSION  HFS quite problematic, as is diarrhea. Disucssed options of changing to IV (including weekly RPMI schedule to avoid pump), further dose reduction. I dont love having him down at about 850 mg/m2 bid as his dose lready in ccycle 3 so suggested we transition to IV, he is reluctant and would much rather tough it out with the cape. Because he is not having any peeling/cracking/blistering I do not think we are causing permanent damage from the cape, mostly just pain so if he can tolerate it I think ok to try    Reviewed plan for diarrhea.     Discussed indetermiinate base of lung mri findings, which I hope are benign, seem rather odd both for this cancer, fact that they were not on preop imaging. Will get CT chest in a few weeks, want to give time for inflammation to die down    Reviewed NGs, nothing particularly actionable.     This was high risk medical decision making on the basis of advanced cancer and treatment with high risk for complication requiring ongoing careful monitoring for severe toxicity.     ______________________________________________________________________  HISTORY     Sergio Perez is seen today at the [facility] for consultation regarding appendiceal adenocarcinoma     Still bad HFS though started later in course and wasn't as severe. Still so bad that he did buy some percocet because he couldn't tolerate the pain.     Notes before treatment he was havig abotu 10 bm /day now it has been 20-30. Taking a lot of imodium but without benefit.     Staying home and cannot do much because of the HFS and the diarrhea. Other than those things he feels ok    MEDICAL HISTORY  1. Alcohol Use  2. Cerebellar Stroke (2017), no residual deficits   3. CAD  4. CPPD  5. GERD  6. HTN  7. Hypothyroidism (acquired following ENT cancer treatment)  8. 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   9. Bilateral total knee  replacement  10. Right side sciatica     Allergies   Allergen Reactions    Hydrocodone Hives     Medications reviewed in the EMR    SOCIAL HISTORY  He lives alone in [place]. 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 He reports quitting alcohol 1 week prior to hospital admission April 2023. He drinks a sixpack of alcohol per day. No drug use.  Sister lives in [place].  Father lives on same land in another house.    PHYSICAL EXAM  There were no vitals taken for this visit.   Looks remarkably good  Hands with mild diffuse erythema but not cracking peeling blisters.     OBJECTIVE DATA  LABS   Reviewed in EMR excellent     RADIOLOGY RESULTS   none

## 2022-02-06 ENCOUNTER — Other Ambulatory Visit: Admit: 2022-02-06 | Discharge: 2022-02-07 | Payer: PRIVATE HEALTH INSURANCE

## 2022-02-06 ENCOUNTER — Ambulatory Visit
Admit: 2022-02-06 | Discharge: 2022-02-07 | Payer: PRIVATE HEALTH INSURANCE | Attending: Hematology & Oncology | Primary: Hematology & Oncology

## 2022-02-06 DIAGNOSIS — C181 Malignant neoplasm of appendix: Principal | ICD-10-CM

## 2022-02-06 DIAGNOSIS — R197 Diarrhea, unspecified: Secondary | ICD-10-CM | POA: Diagnosis not present

## 2022-02-06 DIAGNOSIS — I10 Essential (primary) hypertension: Secondary | ICD-10-CM | POA: Diagnosis not present

## 2022-02-06 DIAGNOSIS — Z602 Problems related to living alone: Secondary | ICD-10-CM | POA: Diagnosis not present

## 2022-02-06 DIAGNOSIS — I2510 Atherosclerotic heart disease of native coronary artery without angina pectoris: Secondary | ICD-10-CM | POA: Diagnosis not present

## 2022-02-06 DIAGNOSIS — Z87891 Personal history of nicotine dependence: Secondary | ICD-10-CM | POA: Diagnosis not present

## 2022-02-06 DIAGNOSIS — R911 Solitary pulmonary nodule: Secondary | ICD-10-CM | POA: Diagnosis not present

## 2022-02-06 DIAGNOSIS — Z6826 Body mass index (BMI) 26.0-26.9, adult: Secondary | ICD-10-CM | POA: Diagnosis not present

## 2022-02-06 DIAGNOSIS — Z885 Allergy status to narcotic agent status: Secondary | ICD-10-CM | POA: Diagnosis not present

## 2022-02-06 LAB — CBC W/ AUTO DIFF
BASOPHILS ABSOLUTE COUNT: 0 10*9/L (ref 0.0–0.1)
BASOPHILS RELATIVE PERCENT: 0.3 %
EOSINOPHILS ABSOLUTE COUNT: 0.1 10*9/L (ref 0.0–0.5)
EOSINOPHILS RELATIVE PERCENT: 1.6 %
HEMATOCRIT: 34 % — ABNORMAL LOW (ref 39.0–48.0)
HEMOGLOBIN: 12.1 g/dL — ABNORMAL LOW (ref 12.9–16.5)
LYMPHOCYTES ABSOLUTE COUNT: 1.3 10*9/L (ref 1.1–3.6)
LYMPHOCYTES RELATIVE PERCENT: 19.8 %
MEAN CORPUSCULAR HEMOGLOBIN CONC: 35.6 g/dL (ref 32.0–36.0)
MEAN CORPUSCULAR HEMOGLOBIN: 33 pg — ABNORMAL HIGH (ref 25.9–32.4)
MEAN CORPUSCULAR VOLUME: 92.6 fL (ref 77.6–95.7)
MEAN PLATELET VOLUME: 7.4 fL (ref 6.8–10.7)
MONOCYTES ABSOLUTE COUNT: 1.1 10*9/L — ABNORMAL HIGH (ref 0.3–0.8)
MONOCYTES RELATIVE PERCENT: 16.5 %
NEUTROPHILS ABSOLUTE COUNT: 3.9 10*9/L (ref 1.8–7.8)
NEUTROPHILS RELATIVE PERCENT: 61.8 %
PLATELET COUNT: 211 10*9/L (ref 150–450)
RED BLOOD CELL COUNT: 3.67 10*12/L — ABNORMAL LOW (ref 4.26–5.60)
RED CELL DISTRIBUTION WIDTH: 16.2 % — ABNORMAL HIGH (ref 12.2–15.2)
WBC ADJUSTED: 6.4 10*9/L (ref 3.6–11.2)

## 2022-02-06 LAB — COMPREHENSIVE METABOLIC PANEL
ALBUMIN: 3.6 g/dL (ref 3.4–5.0)
ALKALINE PHOSPHATASE: 55 U/L (ref 46–116)
ALT (SGPT): 7 U/L — ABNORMAL LOW (ref 10–49)
ANION GAP: 7 mmol/L (ref 5–14)
AST (SGOT): 10 U/L (ref <=34)
BILIRUBIN TOTAL: 0.5 mg/dL (ref 0.3–1.2)
BLOOD UREA NITROGEN: 10 mg/dL (ref 9–23)
BUN / CREAT RATIO: 10
CALCIUM: 8.7 mg/dL (ref 8.7–10.4)
CHLORIDE: 101 mmol/L (ref 98–107)
CO2: 27 mmol/L (ref 20.0–31.0)
CREATININE: 1 mg/dL
EGFR CKD-EPI (2021) MALE: 86 mL/min/{1.73_m2} (ref >=60)
GLUCOSE RANDOM: 137 mg/dL (ref 70–179)
POTASSIUM: 4.1 mmol/L (ref 3.4–4.8)
PROTEIN TOTAL: 6.6 g/dL (ref 5.7–8.2)
SODIUM: 135 mmol/L (ref 135–145)

## 2022-02-06 MED ORDER — DIPHENOXYLATE-ATROPINE 2.5 MG-0.025 MG TABLET
ORAL_TABLET | Freq: Four times a day (QID) | ORAL | 1 refills | 8 days | Status: CP | PRN
Start: 2022-02-06 — End: 2023-02-06

## 2022-02-06 MED ORDER — OXYCODONE 5 MG TABLET
ORAL_TABLET | Freq: Three times a day (TID) | ORAL | 0 refills | 7 days | Status: CP | PRN
Start: 2022-02-06 — End: ?

## 2022-02-10 NOTE — Unmapped (Signed)
[facility] Navigation: Follow-Up  Completed with: Patient      Oncology Patient Navigator (OPN) provided follow-up call to review previously discussed resources/identified barriers and evaluate current needs.     OPN provided active and empathetic listening as patient shared updates since our previous call. He states that he is doing better and denies any financial, logistical, or emotional concerns. He reports that his diarrhea has improved and is no longer as severe. He stated that he has begun a new round of chemo drugs and hopes that the diarrhea does not return. However, he is prepared with two medications to assist him this time.     Patient endorsed understanding of upcoming appointment times/location as well as how to contact medical team if needed.     Additional follow-up call will occur in the coming weeks.

## 2022-02-13 DIAGNOSIS — M5416 Radiculopathy, lumbar region: Secondary | ICD-10-CM | POA: Diagnosis not present

## 2022-02-13 DIAGNOSIS — M5136 Other intervertebral disc degeneration, lumbar region: Secondary | ICD-10-CM | POA: Diagnosis not present

## 2022-02-13 DIAGNOSIS — M48062 Spinal stenosis, lumbar region with neurogenic claudication: Secondary | ICD-10-CM | POA: Diagnosis not present

## 2022-02-17 ENCOUNTER — Other Ambulatory Visit: Payer: Self-pay | Admitting: Family Medicine

## 2022-02-17 NOTE — Telephone Encounter (Signed)
Refill request Amitriptyline Last office visit 01/06/22 Last refill 08/06/21 #135/1

## 2022-02-18 MED ORDER — OXYCODONE 5 MG TABLET
ORAL_TABLET | Freq: Three times a day (TID) | ORAL | 0 refills | 7 days | PRN
Start: 2022-02-18 — End: ?

## 2022-02-18 NOTE — Unmapped (Signed)
.  Hi,     Patient Sergio Perez  contacted the Communication Center requesting to speak with the care team of [facility] Sergio Perez to discuss:    He is calling requesting a medication refill for Oxycodone 5mg .     Please contact Sergio Perez back at [phone] once the medication has been called in so he can make preparations to go and pick it up.      Thank you,   Noland Fordyce  [facility]   [phone]

## 2022-02-20 ENCOUNTER — Other Ambulatory Visit: Payer: Self-pay | Admitting: Family Medicine

## 2022-02-20 NOTE — Telephone Encounter (Signed)
Dentagel gel Last filled:  01/25/22, #56 g Last OV:  01/06/22, CPE Next OV:  9//, 3 mo f/u

## 2022-02-24 MED ORDER — OXYCODONE 5 MG TABLET
ORAL_TABLET | Freq: Three times a day (TID) | ORAL | 0 refills | 7 days | Status: CP | PRN
Start: 2022-02-24 — End: ?

## 2022-02-27 ENCOUNTER — Ambulatory Visit: Admit: 2022-02-27 | Discharge: 2022-02-28 | Payer: PRIVATE HEALTH INSURANCE

## 2022-02-27 ENCOUNTER — Other Ambulatory Visit: Admit: 2022-02-27 | Discharge: 2022-02-28 | Payer: PRIVATE HEALTH INSURANCE

## 2022-02-27 DIAGNOSIS — C181 Malignant neoplasm of appendix: Principal | ICD-10-CM

## 2022-02-27 DIAGNOSIS — Z09 Encounter for follow-up examination after completed treatment for conditions other than malignant neoplasm: Principal | ICD-10-CM

## 2022-02-27 DIAGNOSIS — L271 Localized skin eruption due to drugs and medicaments taken internally: Principal | ICD-10-CM

## 2022-02-27 DIAGNOSIS — C01 Malignant neoplasm of base of tongue: Principal | ICD-10-CM

## 2022-02-27 DIAGNOSIS — K521 Toxic gastroenteritis and colitis: Principal | ICD-10-CM

## 2022-02-27 DIAGNOSIS — T451X5A Adverse effect of antineoplastic and immunosuppressive drugs, initial encounter: Principal | ICD-10-CM

## 2022-02-27 DIAGNOSIS — I10 Essential (primary) hypertension: Secondary | ICD-10-CM | POA: Diagnosis not present

## 2022-02-27 DIAGNOSIS — I2510 Atherosclerotic heart disease of native coronary artery without angina pectoris: Secondary | ICD-10-CM | POA: Diagnosis not present

## 2022-02-27 DIAGNOSIS — E039 Hypothyroidism, unspecified: Secondary | ICD-10-CM | POA: Diagnosis not present

## 2022-02-27 DIAGNOSIS — Z87891 Personal history of nicotine dependence: Secondary | ICD-10-CM | POA: Diagnosis not present

## 2022-02-27 DIAGNOSIS — Z9049 Acquired absence of other specified parts of digestive tract: Secondary | ICD-10-CM | POA: Diagnosis not present

## 2022-02-27 LAB — COMPREHENSIVE METABOLIC PANEL
ALBUMIN: 4 g/dL (ref 3.4–5.0)
ALKALINE PHOSPHATASE: 75 U/L (ref 46–116)
ALT (SGPT): 14 U/L (ref 10–49)
ANION GAP: 6 mmol/L (ref 5–14)
AST (SGOT): 17 U/L (ref <=34)
BILIRUBIN TOTAL: 0.7 mg/dL (ref 0.3–1.2)
BLOOD UREA NITROGEN: 13 mg/dL (ref 9–23)
BUN / CREAT RATIO: 14
CALCIUM: 8.8 mg/dL (ref 8.7–10.4)
CHLORIDE: 105 mmol/L (ref 98–107)
CO2: 27 mmol/L (ref 20.0–31.0)
CREATININE: 0.93 mg/dL
EGFR CKD-EPI (2021) MALE: >90 mL/min/{1.73_m2} (ref >=60)
GLUCOSE RANDOM: 106 mg/dL (ref 70–179)
POTASSIUM: 3.9 mmol/L (ref 3.4–4.8)
PROTEIN TOTAL: 7.1 g/dL (ref 5.7–8.2)
SODIUM: 138 mmol/L (ref 135–145)

## 2022-02-27 LAB — CBC W/ AUTO DIFF
BASOPHILS ABSOLUTE COUNT: 0 10*9/L (ref 0.0–0.1)
BASOPHILS RELATIVE PERCENT: 0.7 %
EOSINOPHILS ABSOLUTE COUNT: 0.1 10*9/L (ref 0.0–0.5)
EOSINOPHILS RELATIVE PERCENT: 1.9 %
HEMATOCRIT: 37.7 % — ABNORMAL LOW (ref 39.0–48.0)
HEMOGLOBIN: 13 g/dL (ref 12.9–16.5)
LYMPHOCYTES ABSOLUTE COUNT: 1.5 10*9/L (ref 1.1–3.6)
LYMPHOCYTES RELATIVE PERCENT: 22.6 %
MEAN CORPUSCULAR HEMOGLOBIN CONC: 34.5 g/dL (ref 32.0–36.0)
MEAN CORPUSCULAR HEMOGLOBIN: 32.5 pg — ABNORMAL HIGH (ref 25.9–32.4)
MEAN CORPUSCULAR VOLUME: 94.1 fL (ref 77.6–95.7)
MEAN PLATELET VOLUME: 7.3 fL (ref 6.8–10.7)
MONOCYTES ABSOLUTE COUNT: 0.8 10*9/L (ref 0.3–0.8)
MONOCYTES RELATIVE PERCENT: 11.7 %
NEUTROPHILS ABSOLUTE COUNT: 4.1 10*9/L (ref 1.8–7.8)
NEUTROPHILS RELATIVE PERCENT: 63.1 %
PLATELET COUNT: 285 10*9/L (ref 150–450)
RED BLOOD CELL COUNT: 4.01 10*12/L — ABNORMAL LOW (ref 4.26–5.60)
RED CELL DISTRIBUTION WIDTH: 19.4 % — ABNORMAL HIGH (ref 12.2–15.2)
WBC ADJUSTED: 6.6 10*9/L (ref 3.6–11.2)

## 2022-02-27 MED FILL — CAPECITABINE 500 MG TABLET: ORAL | 21 days supply | Qty: 112 | Fill #2

## 2022-02-27 MED FILL — UREA 20 % TOPICAL CREAM: TOPICAL | 15 days supply | Qty: 85 | Fill #1

## 2022-02-27 NOTE — Unmapped (Signed)
Labs drawn and sent for analysis. Care provided by J.Fasking LPN

## 2022-02-27 NOTE — Unmapped (Signed)
[facility] Refill Coordination Note    Specialty Medication(s) to be Shipped:   Hematology/Oncology: Capecitabine 500 mg, directions: Take 4 tablets (2,000 mg total) by mouth Two (2) times a day. Take on days 1-14 only followed by 7 days off each 21-day cycle.    Other medication(s) to be shipped:  urea cream     Sergio Perez, DOB: [birth year]  Phone: [phone] (home)       All above HIPAA information was verified with patient.     Was a Nurse, learning disability used for this call? No    Completed refill call assessment today to schedule patient's medication shipment from the [facility] [phone]).  All relevant notes have been reviewed.     Specialty medication(s) and dose(s) confirmed: Regimen is correct and unchanged.   Changes to medications: Sergio Perez reports no changes at this time.  Changes to insurance: No  New side effects reported not previously addressed with a pharmacist or physician: None reported  Questions for the pharmacist: No    Confirmed patient received a Conservation officer, historic buildings and a Surveyor, mining with first shipment. The patient will receive a drug information handout for each medication shipped and additional FDA Medication Guides as required.       DISEASE/MEDICATION-SPECIFIC INFORMATION        N/A - cycle starts 03/03/22    SPECIALTY MEDICATION ADHERENCE     Medication Adherence    Patient reported X missed doses in the last month: 0  Specialty Medication: Capecitabine 500mg   Patient is on additional specialty medications: No  Informant: patient  Confirmed plan for next specialty medication refill: delivery by pharmacy  Refills needed for supportive medications: yes, ordered or provider notified          Refill Coordination    Has the Patients' Contact Information Changed: No  Is the Shipping Address Different: No       Were doses missed due to medication being on hold? No    capecitabine 500 mg: 0 days of medicine on hand     REFERRAL TO PHARMACIST Referral to the pharmacist: Not needed      [id number]     Shipping address confirmed in Epic.     Delivery Scheduled: Yes, Expected medication delivery date: 07/31/2.     Medication will be delivered via UPS to the prescription address in Epic [place].    Mohamedamin Nifong A [facility] Specialty Pharmacist

## 2022-02-27 NOTE — Unmapped (Addendum)
Refill arrangement request sent to [facility] team (Addendum: for capecitabine).    Care coordination: 3 minutes

## 2022-02-27 NOTE — Unmapped (Signed)
[facility]     PRIMARY CARE PROVIDER:  Eustaquio Boyden, MD  [address] [place] [address]    CONSULTING PROVIDERS  Dr. Thornton Papas, Medical Oncology, [facility]  Dr. 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 11/15/21: Right hemicolectomy: G3 poorly diff goblet cell adenocarcinoma with focal mucinous diff, 2.4cm through visceral peritoneum, 2/19 nodes, 5 tumor deposits, pT4aN1b.   [employer] NGS: mutations in  TP53, ATM, MTHFR, SH2B3, MSH2, SRP14, ATRX, LRP1B. TMB 4.7, MSS  2. 12/31/21 adjuvant capecitabine start.   - c1 complicated by HFS.   - c2 dose reduced to 2g BID, still with HFS but shorter and less severe    Treatment plan:  Adjuvant capecitabine x 8 cycles  ____________________________________________________________________    ASSESSMENT  1. Stage IIIB mucinous appendiceal adenocarcinoma  2. AEs: HFS pain without peeling, diarrhea   3. Indeterminate lower lung nodules, likely benign    RECOMMENDATIONS  1. capecitabine at 2000 mg bid for cycle 4  2. Urea cream for hand/foot syndrome, sparing use of oxycodone  3  lomotil 1-2 q6prn  4. RTC 3 weeks prior to cycle 5; with repeat CT chest to reassess indeterminant nodules    DISCUSSION  HFS better with dose reduction, but persistent.  Ran out of urea.  Knows to call to get refill of urea.  He is willing to push through and tolerate side effects, does not want to dose reduce further.  Reviewed diarrhea management, may alternate imodium and lomotil, increase lomotil to TID.      This was high risk medical decision making on the basis of advanced cancer and treatment with high risk for complication requiring ongoing careful monitoring for severe toxicity.     ______________________________________________________________________  HISTORY     Sergio Perez is seen today at the [facility] for consultation regarding appendiceal adenocarcinoma     He comes in today doing well.  Feels cutting back dose has been better, feels HFS is manageable.  No mouth sores.  Not hungry, but makes himself eat.  +diarrhea, 2-3 bms during the day, 2-3 at night.  Taking imodium and lomotil, difficult to define how much he is taking.  Sounds like lomotil only twice a day.  +tearing of eyes, runny nose.      MEDICAL HISTORY  1. Alcohol Use  2. Cerebellar Stroke (2017), no residual deficits   3. CAD  4. CPPD  5. GERD  6. HTN  7. Hypothyroidism (acquired following ENT cancer treatment)  8. 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   9. Bilateral total knee replacement  10. Right side sciatica     Allergies   Allergen Reactions    Hydrocodone Hives     Medications reviewed in the EMR    SOCIAL HISTORY  He lives alone in [place]. 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 He reports quitting alcohol 1 week prior to hospital admission April 2023. He drinks a sixpack of alcohol per day. No drug use.  Sister lives in [place].  Father lives on same land in another house.    PHYSICAL EXAM  BP 131/85  - Pulse 76  - Temp 36.2 ??C (97.2 ??F) (Temporal)  - Resp 18  -  Ht 182.9 cm (6')  - Wt 87.6 kg (193 lb 1.6 oz)  - SpO2 99%  - BMI 26.19 kg/m??    GENERAL: well developed, well nourished, in no distress  PSYCH: full and appropriate range of affect with good insight and judgement  HEENT: NCAT, pupils equal, sclerae anicteric  EXT: warm and well perfused, no edema  SKIN: No rashes    OBJECTIVE DATA  LABS  Reviewed in EMR excellent   CBC, CMP reviewed, ok for ongoing treatment    RADIOLOGY RESULTS   none

## 2022-03-02 MED ORDER — LOPERAMIDE 2 MG CAPSULE
ORAL_CAPSULE | 1 refills | 0 days
Start: 2022-03-02 — End: ?

## 2022-03-02 MED ORDER — DIPHENOXYLATE-ATROPINE 2.5 MG-0.025 MG TABLET
ORAL_TABLET | 0 refills | 0 days | Status: CP
Start: 2022-03-02 — End: ?

## 2022-03-02 NOTE — Unmapped (Signed)
Please refill if appropriate.     Most Recent Clinic Visit:Visit date not found  Next Clinic Visit: Visit date not found

## 2022-03-04 ENCOUNTER — Encounter: Payer: Self-pay | Admitting: Family Medicine

## 2022-03-04 ENCOUNTER — Ambulatory Visit: Payer: BC Managed Care – PPO | Admitting: Family Medicine

## 2022-03-04 VITALS — BP 130/86 | HR 76 | Temp 97.6°F | Ht 72.0 in | Wt 194.1 lb

## 2022-03-04 DIAGNOSIS — M7138 Other bursal cyst, other site: Secondary | ICD-10-CM

## 2022-03-04 DIAGNOSIS — Z113 Encounter for screening for infections with a predominantly sexual mode of transmission: Secondary | ICD-10-CM | POA: Diagnosis not present

## 2022-03-04 DIAGNOSIS — D849 Immunodeficiency, unspecified: Secondary | ICD-10-CM | POA: Diagnosis not present

## 2022-03-04 DIAGNOSIS — C181 Malignant neoplasm of appendix: Secondary | ICD-10-CM | POA: Diagnosis not present

## 2022-03-04 DIAGNOSIS — N485 Ulcer of penis: Secondary | ICD-10-CM | POA: Diagnosis not present

## 2022-03-04 MED ORDER — LOPERAMIDE 2 MG CAPSULE
ORAL_CAPSULE | 1 refills | 0 days | Status: CP
Start: 2022-03-04 — End: ?
  Filled 2022-03-04: qty 60, 7d supply, fill #0

## 2022-03-04 NOTE — Unmapped (Signed)
[facility] Navigation: Follow-Up  Completed with: Patient      Oncology Patient Navigator (OPN) provided follow-up call to review previously discussed resources/identified barriers and evaluate current needs. Patient reports that he is hanging in there and that he is down to one pill of his diarrhea medicine and wondered whether it had been refilled. In Clearnce Sorrel, RN, epic note, she requested that his medicine be refilled. Patient inquired whether this medication would be mailed to him. OPN sent epic in basket to [facility] to inquire if prescription would be shipped.    Patient endorsed understanding of upcoming appointment times/location as well as how to contact medical team if needed.     Additional follow-up call will occur in the coming weeks.

## 2022-03-04 NOTE — Progress Notes (Signed)
Patient ID: Patrick Bailey., male    DOB: 07/07/1961, 61 y.o.   MRN: [id number]  This visit was conducted in person.  BP 130/86   Pulse 76   Temp 97.6 F (36.4 C) (Temporal)   Ht 6' (1.829 m)   Wt 194 lb 2 oz (88.1 kg)   SpO2 99%   BMI 26.33 kg/m    CC: personal issue Subjective:   HPI: Patrick Bailey. is a 61 y.o. male presenting on 03/04/2022 for Personal Problem (Wants to discuss with Dr. Darnell Level about issues. )   Recent dx appendiceal goblet cell adenocarcinoma in setting of acute appendicitis s/p R hemicolectomy 11/2021, tumor invaded visceral peritoneum and 2/20 pos lymph nodes. Seeing [facility] oncology Dr Altamease Oiler and Lynelle Smoke NP - started treatment with capecitabine chemo last week. Side effect of hand foot syndrome with peeling and diarrhea. Using urea cream to hands and feet with benefit.   Has been cleaning toilet with bleach - thinks bleach got onto penis and caused blistering of skin.  No dysuria, hematuria, swollen glands.  No sexual activity in the past year. 1 partner in last several years previous.    Recent severe R thigh pain s/p MRI showing new cyst at R L5/S1 facet joint pressing on R S1 nerve root - referred to neurosurgery, appt pending. He's been treating pain with gabapentin '400mg'$  bid, '1200mg'$  nightly and oxycodone/apap 5/'325mg'$  BID PRN severe pain.  Saw PM&R - s/p R S1 TF ESI with 80% improvement, has option to rpt injection if needed.  Referred to neurosurgeon - didn't go.      Relevant past medical, surgical, family and social history reviewed and updated as indicated. Interim medical history since our last visit reviewed. Allergies and medications reviewed and updated. Outpatient Medications Prior to Visit  Medication Sig Dispense Refill   amitriptyline (ELAVIL) 50 MG tablet Take 1.5 tablets (75 mg total) by mouth at bedtime. 135 tablet 1   amLODipine (NORVASC) 10 MG tablet Take 1 tablet (10 mg total) by mouth daily. 90 tablet 0   atorvastatin (LIPITOR) 40 MG  tablet Take 1 tablet by mouth once daily 90 tablet 3   capecitabine (XELODA) 500 MG tablet Take 5 tablets (2,500 mg total) by mouth 2 (two) times daily after a meal. 2 wks on, 1 wk off     clopidogrel (PLAVIX) 75 MG tablet Take 1 tablet by mouth once daily (Patient taking differently: Take 75 mg by mouth daily.) 90 tablet 3   DENTAGEL 1.1 % GEL dental gel PLACE 1 APPLICATION ONTO TEETH AT BEDTIME. 56 g 7   famotidine (PEPCID) 20 MG tablet Take 1 tablet (20 mg total) by mouth 2 (two) times daily.     fenofibrate 160 MG tablet Take 1 tablet by mouth once daily (Patient taking differently: Take 160 mg by mouth daily.) 90 tablet 0   gabapentin (NEURONTIN) 400 MG capsule Take 1 capsule (400 mg total) by mouth 2 (two) times daily AND 3 capsules (1,200 mg total) at bedtime. 150 capsule 5   levothyroxine (SYNTHROID) 150 MCG tablet Take 1 tablet by mouth once daily 90 tablet 3   metoprolol tartrate (LOPRESSOR) 100 MG tablet Take 1 tablet (100 mg total) by mouth 2 (two) times daily. 180 tablet 3   MITIGARE 0.6 MG CAPS Take 0.6 mg by mouth daily as needed (gout flare). TAKE 1 CAPSULE BY MOUTH IN THE MORNING AS NEEDED (Patient taking differently: Take 0.6 mg by mouth daily as needed (  gout flare).)     Omega-3 Fatty Acids (FISH OIL) 1000 MG CAPS Take 2 capsules (2,000 mg total) by mouth 2 (two) times a day.  0   oxyCODONE-acetaminophen (PERCOCET/ROXICET) 5-325 MG tablet Take 1 tablet by mouth 2 (two) times daily as needed for severe pain. 20 tablet 0   No facility-administered medications prior to visit.     Per HPI unless specifically indicated in ROS section below Review of Systems  Objective:  BP 130/86   Pulse 76   Temp 97.6 F (36.4 C) (Temporal)   Ht 6' (1.829 m)   Wt 194 lb 2 oz (88.1 kg)   SpO2 99%   BMI 26.33 kg/m   Wt Readings from Last 3 Encounters:  03/04/22 194 lb 2 oz (88.1 kg)  01/07/22 201 lb 4 oz (91.3 kg)  01/06/22 201 lb 6 oz (91.3 kg)      Physical Exam Vitals and nursing  note reviewed.  Constitutional:      Appearance: Normal appearance. He is not ill-appearing.  Genitourinary:    Penis: Circumcised. Lesions present.        Comments: Shallow ulceration to dorsal glans penis without discharge or streaking redness Lymphadenopathy:     Lower Body: No right inguinal adenopathy. No left inguinal adenopathy.  Neurological:     Mental Status: He is alert.       Assessment & Plan:   Problem List Items Addressed This Visit     Cancer of appendix ([place])    Appreciate [facility] onc care.       Penile ulcer - Primary    Anticipate penile ulcer due to bleach burn given recent exposure.  Supportive measures reviewed - rec start using moisturizing cream like aveeno or aquaphor to open sore.  Current immunocompromised status complicates condition. Check HIV/RPR to r/o other cause.  He has had hand/sole peeling due to chemo drug - r/o penile rash due to chemo - if not improving as expected, did recommend discussing this with onc.  Red flags to seek further care reviewed.       Relevant Orders   HIV Antibody (routine testing w rflx)   RPR   Immunocompromised ([place])    Continues capecitabine through [facility] oncology.  Side effect of ongoing diarrhea and hand/foot peeling.       Cyst of lumbar facet joint    Significant benefit after R S1 TF ESI. Did not see neurosurgery, overall stable period so will defer neurosurgical eval at this time.         No orders of the defined types were placed in this encounter.  Orders Placed This Encounter  Procedures   HIV Antibody (routine testing w rflx)   RPR     Patient Instructions  Labs today Likely blistering burn from bleach.  Start using moisturizing cream to area (aveeno, eucerin or aquaphor) - non scented.  Let us and chemo doctor know if not healing as expected.   Follow up plan: Return if symptoms worsen or fail to improve.  Ria Bush, MD

## 2022-03-04 NOTE — Patient Instructions (Signed)
Labs today Likely blistering burn from bleach.  Start using moisturizing cream to area (aveeno, eucerin or aquaphor) - non scented.  Let us and chemo doctor know if not healing as expected.

## 2022-03-04 NOTE — Assessment & Plan Note (Signed)
Significant benefit after R S1 TF ESI. Did not see neurosurgery, overall stable period so will defer neurosurgical eval at this time.

## 2022-03-04 NOTE — Assessment & Plan Note (Signed)
Continues capecitabine through [facility] oncology.  Side effect of ongoing diarrhea and hand/foot peeling.

## 2022-03-04 NOTE — Assessment & Plan Note (Signed)
Appreciate [facility] onc care.

## 2022-03-04 NOTE — Assessment & Plan Note (Addendum)
Anticipate penile ulcer due to bleach burn given recent exposure.  Supportive measures reviewed - rec start using moisturizing cream like aveeno or aquaphor to open sore.  Current immunocompromised status complicates condition. Check HIV/RPR to r/o other cause.  He has had hand/sole peeling due to chemo drug - r/o penile rash due to chemo - if not improving as expected, did recommend discussing this with onc.  Red flags to seek further care reviewed.

## 2022-03-05 LAB — HIV ANTIBODY (ROUTINE TESTING W REFLEX): HIV 1&2 Ab, 4th Generation: NONREACTIVE

## 2022-03-05 LAB — RPR: RPR Ser Ql: NONREACTIVE

## 2022-03-10 MED ORDER — OXYCODONE 5 MG TABLET
ORAL_TABLET | Freq: Three times a day (TID) | ORAL | 0 refills | 7 days | PRN
Start: 2022-03-10 — End: ?

## 2022-03-10 NOTE — Unmapped (Signed)
Hi,    Patient Sergio Perez called requesting a medication refill for the following:    Medication: Oxycodone  Dosage: 5mg   Days left of medication: 0  Pharmacy: [id number]      Thank you,  Durward Fortes  [facility] Communication Center  [phone]

## 2022-03-18 NOTE — Unmapped (Signed)
[facility] Refill Coordination Note    Specialty Medication(s) to be Shipped:   Hematology/Oncology: Capecitabine 500mg , directions: Take 4 tablets (2,000 mg total) by mouth Two (2) times a day. Take on days 1-14 only followed by 7 days off each 21-day cycle.    Other medication(s) to be shipped:  Imodium     Lorn Junes, DOB: [birth year]  Phone: [phone] (home)       All above HIPAA information was verified with patient.     Was a Nurse, learning disability used for this call? No    Completed refill call assessment today to schedule patient's medication shipment from the [facility] [phone]).  All relevant notes have been reviewed.     Specialty medication(s) and dose(s) confirmed: Regimen is correct and unchanged.   Changes to medications: Collier reports no changes at this time.  Changes to insurance: No  New side effects reported not previously addressed with a pharmacist or physician: None reported  Questions for the pharmacist: No    Confirmed patient received a Conservation officer, historic buildings and a Surveyor, mining with first shipment. The patient will receive a drug information handout for each medication shipped and additional FDA Medication Guides as required.       DISEASE/MEDICATION-SPECIFIC INFORMATION        N/A    SPECIALTY MEDICATION ADHERENCE     Medication Adherence    Patient reported X missed doses in the last month: 1  Specialty Medication: Capecitabine 500 mg  Patient is on additional specialty medications: No  Informant: patient                       Were doses missed due to medication being on hold? No    Capecitabine  500 mg: 1 days of medicine on hand New cycle starts 03/24/22      REFERRAL TO PHARMACIST     Referral to the pharmacist: Not needed      [facility]     Shipping address confirmed in Epic.     Delivery Scheduled: Yes, Expected medication delivery date: 03/20/22.     Medication will be delivered via UPS to the prescription address in Epic [place].    Wyatt Mage M Elisabeth Cara   [facility] Specialty Technician

## 2022-03-20 ENCOUNTER — Ambulatory Visit: Admit: 2022-03-20 | Discharge: 2022-03-21 | Payer: PRIVATE HEALTH INSURANCE

## 2022-03-20 ENCOUNTER — Other Ambulatory Visit: Admit: 2022-03-20 | Discharge: 2022-03-21 | Payer: PRIVATE HEALTH INSURANCE

## 2022-03-20 DIAGNOSIS — R911 Solitary pulmonary nodule: Principal | ICD-10-CM

## 2022-03-20 DIAGNOSIS — L271 Localized skin eruption due to drugs and medicaments taken internally: Principal | ICD-10-CM

## 2022-03-20 DIAGNOSIS — C181 Malignant neoplasm of appendix: Principal | ICD-10-CM

## 2022-03-20 DIAGNOSIS — Z09 Encounter for follow-up examination after completed treatment for conditions other than malignant neoplasm: Principal | ICD-10-CM

## 2022-03-20 DIAGNOSIS — Z96653 Presence of artificial knee joint, bilateral: Secondary | ICD-10-CM | POA: Diagnosis not present

## 2022-03-20 DIAGNOSIS — I10 Essential (primary) hypertension: Secondary | ICD-10-CM | POA: Diagnosis not present

## 2022-03-20 DIAGNOSIS — K219 Gastro-esophageal reflux disease without esophagitis: Secondary | ICD-10-CM | POA: Diagnosis not present

## 2022-03-20 DIAGNOSIS — Z8673 Personal history of transient ischemic attack (TIA), and cerebral infarction without residual deficits: Secondary | ICD-10-CM | POA: Diagnosis not present

## 2022-03-20 DIAGNOSIS — Z87891 Personal history of nicotine dependence: Secondary | ICD-10-CM | POA: Diagnosis not present

## 2022-03-20 DIAGNOSIS — M5431 Sciatica, right side: Secondary | ICD-10-CM | POA: Diagnosis not present

## 2022-03-20 DIAGNOSIS — F1090 Alcohol use, unspecified, uncomplicated: Secondary | ICD-10-CM | POA: Diagnosis not present

## 2022-03-20 DIAGNOSIS — R197 Diarrhea, unspecified: Secondary | ICD-10-CM | POA: Diagnosis not present

## 2022-03-20 DIAGNOSIS — E039 Hypothyroidism, unspecified: Secondary | ICD-10-CM | POA: Diagnosis not present

## 2022-03-20 DIAGNOSIS — I2510 Atherosclerotic heart disease of native coronary artery without angina pectoris: Secondary | ICD-10-CM | POA: Diagnosis not present

## 2022-03-20 LAB — COMPREHENSIVE METABOLIC PANEL
ALBUMIN: 4.3 g/dL (ref 3.4–5.0)
ALKALINE PHOSPHATASE: 72 U/L (ref 46–116)
ALT (SGPT): 11 U/L (ref 10–49)
ANION GAP: 8 mmol/L (ref 5–14)
AST (SGOT): 17 U/L (ref <=34)
BILIRUBIN TOTAL: 0.6 mg/dL (ref 0.3–1.2)
BLOOD UREA NITROGEN: 12 mg/dL (ref 9–23)
BUN / CREAT RATIO: 13
CALCIUM: 9.4 mg/dL (ref 8.7–10.4)
CHLORIDE: 104 mmol/L (ref 98–107)
CO2: 27 mmol/L (ref 20.0–31.0)
CREATININE: 0.96 mg/dL
EGFR CKD-EPI (2021) MALE: 90 mL/min/{1.73_m2} (ref >=60)
GLUCOSE RANDOM: 96 mg/dL (ref 70–179)
POTASSIUM: 4 mmol/L (ref 3.4–4.8)
PROTEIN TOTAL: 7.6 g/dL (ref 5.7–8.2)
SODIUM: 139 mmol/L (ref 135–145)

## 2022-03-20 LAB — CBC W/ AUTO DIFF
BASOPHILS ABSOLUTE COUNT: 0 10*9/L (ref 0.0–0.1)
BASOPHILS RELATIVE PERCENT: 0.4 %
EOSINOPHILS ABSOLUTE COUNT: 0.2 10*9/L (ref 0.0–0.5)
EOSINOPHILS RELATIVE PERCENT: 3.7 %
HEMATOCRIT: 39.9 % (ref 39.0–48.0)
HEMOGLOBIN: 13.7 g/dL (ref 12.9–16.5)
LYMPHOCYTES ABSOLUTE COUNT: 2 10*9/L (ref 1.1–3.6)
LYMPHOCYTES RELATIVE PERCENT: 31.3 %
MEAN CORPUSCULAR HEMOGLOBIN CONC: 34.3 g/dL (ref 32.0–36.0)
MEAN CORPUSCULAR HEMOGLOBIN: 33.6 pg — ABNORMAL HIGH (ref 25.9–32.4)
MEAN CORPUSCULAR VOLUME: 98 fL — ABNORMAL HIGH (ref 77.6–95.7)
MEAN PLATELET VOLUME: 6.9 fL (ref 6.8–10.7)
MONOCYTES ABSOLUTE COUNT: 0.5 10*9/L (ref 0.3–0.8)
MONOCYTES RELATIVE PERCENT: 8.1 %
NEUTROPHILS ABSOLUTE COUNT: 3.5 10*9/L (ref 1.8–7.8)
NEUTROPHILS RELATIVE PERCENT: 56.5 %
PLATELET COUNT: 238 10*9/L (ref 150–450)
RED BLOOD CELL COUNT: 4.07 10*12/L — ABNORMAL LOW (ref 4.26–5.60)
RED CELL DISTRIBUTION WIDTH: 20.7 % — ABNORMAL HIGH (ref 12.2–15.2)
WBC ADJUSTED: 6.3 10*9/L (ref 3.6–11.2)

## 2022-03-20 MED ORDER — UREA 20 % TOPICAL CREAM
Freq: Three times a day (TID) | TOPICAL | 3 refills | 0 days | Status: CP
Start: 2022-03-20 — End: 2023-03-20

## 2022-03-20 MED ORDER — CAPECITABINE 500 MG TABLET
ORAL_TABLET | Freq: Two times a day (BID) | ORAL | 3 refills | 14 days | Status: CP
Start: 2022-03-20 — End: ?
  Filled 2022-03-19: qty 112, 21d supply, fill #3
  Filled 2022-04-21: qty 84, 21d supply, fill #0

## 2022-03-20 MED ORDER — MUPIROCIN 2 % TOPICAL OINTMENT
Freq: Three times a day (TID) | TOPICAL | 1 refills | 0 days | Status: CP
Start: 2022-03-20 — End: 2022-03-27

## 2022-03-20 NOTE — Unmapped (Signed)
[name]     PRIMARY CARE PROVIDER:  Eustaquio Boyden, MD  [address] [place] [address]    CONSULTING PROVIDERS  Dr. Thornton Papas, Medical Oncology, [facility]  Dr. 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 11/15/21: Right hemicolectomy: G3 poorly diff goblet cell adenocarcinoma with focal mucinous diff, 2.4cm through visceral peritoneum, 2/19 nodes, 5 tumor deposits, pT4aN1b.   [facility] NGS: mutations in  TP53, ATM, MTHFR, SH2B3, MSH2, SRP14, ATRX, LRP1B. TMB 4.7, MSS  2. 12/31/21 adjuvant capecitabine start.   - c1 complicated by HFS.   - c2 dose reduced to 2g BID, still with HFS but shorter and less severe    Treatment plan:  Adjuvant capecitabine x 8 cycles  ____________________________________________________________________    ASSESSMENT  1. Stage IIIB mucinous appendiceal adenocarcinoma  2. AEs: HFS pain without peeling, diarrhea   3. Indeterminate lower lung nodules - concern for slow growing malignancy    RECOMMENDATIONS  1. Delay start by one week and decrease dose to capecitabine at 1500 mg bid for cycle 5  2. Urea cream for hand/foot syndrome, sparing use of oxycodone  3  lomotil 1-2 q6prn  4. RTC 4 weeks prior to cycle 6;   5. Referral to [facility]    DISCUSSION  [name] is having worsening HFS.  He has previously not wanted to decrease his dose of capecitabine, but I discussed with him today that given worsening degree of side effect, I advise holding cape an extra week to allow time to heal/improve.  Then restart at lower dose.  He is now in agreement with this plan.  We discussed CT chest results showing stable LLL nodule from most recent imaging, but has slowing grown from prior imaging raising some concern this may be a slow growing cancer, unclear if a different primary from his appendiceal ca.      This was high risk medical decision making on the basis of advanced cancer and treatment with high risk for complication requiring ongoing careful monitoring for severe toxicity.     ______________________________________________________________________  HISTORY     Sergio Perez is seen today at the [facility] for consultation regarding appendiceal adenocarcinoma     He comes in doing fair.  HFS worse, some toenails have become loose, not falling off yet.  Hands store, red, peeling.  Using lotion.  Appetite is good. Takes nausea med BID to try to prevent nausea.  +diarrhea, 3-4 bms during the day, 2-3 at night.  Taking imodium after each loose stool.  Sounds like lomotil only twice a day.  +tearing of eyes, runny nose.      MEDICAL HISTORY  1. Alcohol Use  2. Cerebellar Stroke (2017), no residual deficits   3. CAD  4. CPPD  5. GERD  6. HTN  7. Hypothyroidism (acquired following ENT cancer treatment)  8. 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   9. Bilateral total knee replacement  10. Right side sciatica     Allergies   Allergen Reactions    Hydrocodone Hives     Medications reviewed in the EMR    SOCIAL HISTORY  He lives alone in [place]. 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 He reports quitting alcohol 1  week prior to hospital admission April 2023. He drinks a sixpack of alcohol per day. No drug use.  Sister lives in [place].  Father lives on same land in another house.    PHYSICAL EXAM  BP 137/91  - Pulse 77  - Temp 36.3 ??C (97.4 ??F)  - Resp 18  - Ht 182.9 cm (6')  - Wt 87.2 kg (192 lb 3.2 oz)  - SpO2 99%  - BMI 26.07 kg/m??    GENERAL: well developed, well nourished, in no distress  PSYCH: full and appropriate range of affect with good insight and judgement  HEENT: NCAT, pupils equal, sclerae anicteric  EXT: warm and well perfused, no edema  SKIN: incredibly dry skin, palms of hands, soles of feet.  +peeling.  Some toenails loose.    OBJECTIVE DATA  LABS  Reviewed in EMR excellent   CBC, CMP reviewed, ok for ongoing treatment    RADIOLOGY RESULTS   CT chest:  LLL part solid nodule 1.8cm, nodular component - concern for slow growing malignancy

## 2022-03-20 NOTE — Unmapped (Signed)
[facility] team notified of plan to delay start of next capecitabine cycle by a week and to dose reduce; [facility] team aware that provider is sending in new capecitabine prescription to reflect dose reduction.    Care coordination: 3 minutes

## 2022-03-20 NOTE — Unmapped (Unsigned)
Labs drawn via butterfly & sent for analysis.  To next appt.  Care provided by Lazaro Arms., RN.

## 2022-03-23 NOTE — Unmapped (Addendum)
[facility] Pharmacist has reviewed a new prescription for capecitabine that indicates a dose decrease.  Patient was counseled on this dosage change by provider- see epic note from 03/20/22.  Next refill call date adjusted if necessary.    Cycle was delayed by 1 week due to side effects.    Sergio Perez  [facility] Specialty Pharmacist    Clinical Assessment Needed For: Dose Change  Medication: Capecitabine  Last Fill Date/Day Supply: 8-17 / 14  Refill Too Soon until 9/3  Was previous dose already scheduled to fill: No    Notes to Pharmacist:

## 2022-03-24 MED ORDER — UREA 20 % TOPICAL CREAM
Freq: Three times a day (TID) | TOPICAL | 3 refills | 0 days | Status: CP
Start: 2022-03-24 — End: 2023-03-24
  Filled 2022-03-23: qty 85, 15d supply, fill #0

## 2022-03-24 NOTE — Unmapped (Signed)
[facility] Navigation: Follow-Up  Completed with: Patient      Oncology Patient Navigator (OPN) provided follow-up call to review previously discussed resources/identified barriers and evaluate current needs. OPN provided active and empathetic listening as patient shared updates since our previous call. He reports that his diarrhea is becoming better, which he appreciates. He states that he needs a refill of his urea cream. OPN advised him to call or send a MyChart message to request a refill. OPN sent epic in basket to [facility] to inquire about refill.     Patient endorsed understanding of upcoming appointment times/location as well as how to contact medical team if needed.      Additional follow-up call will occur in the coming weeks.

## 2022-03-31 ENCOUNTER — Other Ambulatory Visit: Payer: Self-pay | Admitting: Family Medicine

## 2022-03-31 DIAGNOSIS — E781 Pure hyperglyceridemia: Secondary | ICD-10-CM

## 2022-04-02 NOTE — Unmapped (Signed)
Telephone call from patient regarding his appointment this morning. He said he isnt feeling well and wants to reschedule the appointment. The patient agreed to a new appointment on the next available 9/19.

## 2022-04-06 NOTE — Unmapped (Signed)
Upcoming Appt:  Future Appointments   Date Time Provider Department Center   04/17/2022  9:30 AM ADULT ONC LAB [facility]   04/17/2022 10:30 AM Tammy Tobey Bride, ANP [facility]   04/21/2022  9:20 AM Christina Renee Macrosty, DO [facility]       Recent:   What is the date of your last related visit?  NA  Related acute medications Rx'd:  NA  Home treatment tried:  Mucinex      Relevant:   Allergies: Hydrocodone  Medications: capecitabine   Health History: squamous cell Carcinoma of tongue, malignant neoplasm of appendix per EMR  Weight: NA      [facility] patients only:  What was the date of your last cancer treatment (mm/dd/yy)?: daily capecitabine  Was the treatment oral or infusion?: oral  Are you currently on TVEC (yes/no)?: NA    Reason for Disposition   Cough    Answer Assessment - Initial Assessment Questions  1. ONSET: When did the cough begin?       9.1.23  2. SEVERITY: How bad is the cough today?       Frequent cough, when lying down coughs up sputum  3. SPUTUM: Describe the color of your sputum (none, dry cough; clear, white, yellow, green)      green  4. HEMOPTYSIS: Are you coughing up any blood? If so ask: How much? (flecks, streaks, tablespoons, etc.)      No blood   5. DIFFICULTY BREATHING: Are you having difficulty breathing? If Yes, ask: How bad is it? (e.g., mild, moderate, severe)     - MILD: No SOB at rest, mild SOB with walking, speaks normally in sentences, can lie down, no retractions, pulse < 100.     - MODERATE: SOB at rest, SOB with minimal exertion and prefers to sit, cannot lie down flat, speaks in phrases, mild retractions, audible wheezing, pulse 100-120.     - SEVERE: Very SOB at rest, speaks in single words, struggling to breathe, sitting hunched forward, retractions, pulse > 120       No wheezing, no shortness of breath  6. FEVER: Do you have a fever? If Yes, ask: What is your temperature, how was it measured, and when did it start?      No fever   7. CARDIAC HISTORY: Do you have any history of heart disease? (e.g., heart attack, congestive heart failure)       Denies   8. LUNG HISTORY: Do you have any history of lung disease?  (e.g., pulmonary embolus, asthma, emphysema)      Denies   9. PE RISK FACTORS: Do you have a history of blood clots? (or: recent major surgery, recent prolonged travel, bedridden)      denies  10. OTHER SYMPTOMS: Do you have any other symptoms? (e.g., runny nose, wheezing, chest pain)        No chest pain  11. PREGNANCY: Is there any chance you are pregnant? When was your last menstrual period?        NA  12. TRAVEL: Have you traveled out of the country in the last month? (e.g., travel history, exposures)        NA    Protocols used: Cough - Acute Productive-A-AH

## 2022-04-06 NOTE — Unmapped (Signed)
Regarding: Increased congestion, medication question, productive cough  ----- Message from Willette Cluster sent at 04/06/2022  3:38 PM EDT -----  If you had not called [facility], what do you think you would have done?   Home Care

## 2022-04-08 ENCOUNTER — Ambulatory Visit: Payer: BC Managed Care – PPO | Admitting: Family Medicine

## 2022-04-09 NOTE — Unmapped (Signed)
[facility] Triage Note      Last seen in clinic on 03/20/2022  Relevant Medications: Capecitbine 500 mg ,  off week will start on 9/12      Phone Assessment:   Returned call to [name] who had called in on 9/4 because he was having a cough.  He was advised some home care which included Robitussin DM.  He stated that his cough got better but it got worse last night.  He said that he woke up about 3 times last night and was not able to lay down flat due to the coughing.  He is coughing up green chunks.  No fever.  Able to speak in full sentences, no SOB, no blood in the sputum, no chest pain         He is requesting that we call in a Zpack for him.     Triage Recommendations:   Advised that he see his PCP or an urgent care so that someone can lay eyes on him and listen to him.      Caller's Response:   Pt states that he does not want to go to urgent care because they don't help and his PCP won't see sick people.  I advised that he call them to speak with him over the phone as well.   Outstanding tasks: Care team notified

## 2022-04-09 NOTE — Unmapped (Signed)
Hi,     Ray contacted the Communication Center regarding the following:    States that he would like to speak with someone, about the OTC medication that he was told to get       Please contact Ray at [phone].    Thanks in advance,    Iona Hansen  [facility] Communication Center   [phone]

## 2022-04-10 ENCOUNTER — Ambulatory Visit (INDEPENDENT_AMBULATORY_CARE_PROVIDER_SITE_OTHER)
Admission: RE | Admit: 2022-04-10 | Discharge: 2022-04-10 | Disposition: A | Discharge status: Home / Self Care | Payer: BC Managed Care – PPO | Source: Ambulatory Visit | Attending: Family | Admitting: Family

## 2022-04-10 ENCOUNTER — Encounter: Payer: Self-pay | Admitting: Family

## 2022-04-10 ENCOUNTER — Ambulatory Visit: Payer: BC Managed Care – PPO | Admitting: Family

## 2022-04-10 VITALS — BP 112/68 | HR 79 | Temp 98.6°F | Resp 16 | Ht 72.0 in | Wt 188.4 lb

## 2022-04-10 DIAGNOSIS — R051 Acute cough: Secondary | ICD-10-CM | POA: Diagnosis not present

## 2022-04-10 DIAGNOSIS — R062 Wheezing: Secondary | ICD-10-CM

## 2022-04-10 DIAGNOSIS — J40 Bronchitis, not specified as acute or chronic: Secondary | ICD-10-CM | POA: Diagnosis not present

## 2022-04-10 DIAGNOSIS — J449 Chronic obstructive pulmonary disease, unspecified: Secondary | ICD-10-CM | POA: Diagnosis not present

## 2022-04-10 DIAGNOSIS — R059 Cough, unspecified: Secondary | ICD-10-CM | POA: Diagnosis not present

## 2022-04-10 MED ORDER — AZITHROMYCIN 250 MG PO TABS: ORAL_TABLET | ORAL | 0 refills | Status: AC

## 2022-04-10 MED ORDER — GUAIFENESIN-CODEINE 100-10 MG/5ML PO SYRP: 5.0000 mL | ORAL_SOLUTION | Freq: Three times a day (TID) | ORAL | 0 refills | Status: AC | PRN

## 2022-04-10 NOTE — Assessment & Plan Note (Signed)
Dg chest xray stat today in office, r/o pneumonia Pt declines prednisone, doesn't tolerate well. Pending results.

## 2022-04-10 NOTE — Assessment & Plan Note (Signed)
cxr to r/o pneumonai which will change course of treatment Per patient preference, zpack sent to pharmacy. Start as directed.  Take antibiotic as prescribed. Increase oral fluids. Pt to f/u if sx worsen and or fail to improve in 2-3 days.
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
## 2022-04-10 NOTE — Progress Notes (Signed)
CXR negative for pneumonia.  Take zpack.  Did see COPD on cxr which makes sense because I was able to hear increased air flow top of lungs when breathing. Will treat with zpack for now, may have to consider inhaler down the road if no improvement. Did see plaquing of arteries, continue to work on low cholesterol diet

## 2022-04-10 NOTE — Assessment & Plan Note (Signed)
Unable to sleep, sending in RX for cheratussin 5 ml prn  Use cautiously and not with other pain medications and or sedative type medication

## 2022-04-13 NOTE — Unmapped (Signed)
Returned call to [name] in regards to his request for a Return to Work letter.  His sister had sent a message for him in My chart requesting the same.  Needs Light duty for 2 weeks.  He is requesting that we send the letter to his sister via My Chart.    I advised that I would let his team know that he is requesting this. I told him that I was not sure the time frame in which this would be completed but that it could take a couple of days due to the Nurse Navigator being in clinic etc.      Pt understands and would like the letter to be sent to his sister so she can get it to his work.

## 2022-04-13 NOTE — Unmapped (Signed)
Hi,     Jobanny contacted the Communication Center requesting to speak with the care team of [facility] Damonie Yelinek to discuss:    Orrin stated to please respond to myChart message in regards to returning to work. 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    Please contact Graylen  at [phone].        Thank you,   Warnell Bureau  [facility]   [phone]

## 2022-04-16 NOTE — Unmapped (Signed)
[facility]     PRIMARY CARE PROVIDER:  Eustaquio Boyden, MD  [address] [place] [address]    CONSULTING PROVIDERS  Dr. Thornton Papas, Medical Oncology, [facility]  Dr. 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 11/15/21: Right hemicolectomy: G3 poorly diff goblet cell adenocarcinoma with focal mucinous diff, 2.4cm through visceral peritoneum, 2/19 nodes, 5 tumor deposits, pT4aN1b.   [facility] NGS: mutations in  TP53, ATM, MTHFR, SH2B3, MSH2, SRP14, ATRX, LRP1B. TMB 4.7, MSS  2. 12/31/21 adjuvant capecitabine start.   - c1 complicated by HFS.   - c2 dose reduced to 2g BID, still with HFS but shorter and less severe    Treatment plan:  Adjuvant capecitabine x 8 cycles  ____________________________________________________________________    ASSESSMENT  1. Stage IIIB mucinous appendiceal adenocarcinoma  2. AEs: HFS pain without peeling, diarrhea   3. Indeterminate lower lung nodules - concern for slow growing malignancy    RECOMMENDATIONS  1. Capecitabine at 1500 mg bid for cycle 6  2. Urea cream for hand/foot syndrome, sparing use of oxycodone  3  lomotil 1-2 q6prn  4. RTC 4 weeks prior to cycle 7   5. Referral to [facility] - schedule 9/19    DISCUSSION  Sergio Perez     This was high risk medical decision making on the basis of advanced cancer and treatment with high risk for complication requiring ongoing careful monitoring for severe toxicity.     ______________________________________________________________________  HISTORY     Sergio Perez is seen today at the [facility] for consultation regarding appendiceal adenocarcinoma     He comes in doing fair.  HFS worse, some toenails have become loose, not falling off yet.  Hands store, red, peeling.  Using lotion.  Appetite is good. Takes nausea med BID to try to prevent nausea.  +diarrhea, 3-4 bms during the day, 2-3 at night.  Taking imodium after each loose stool.  Sounds like lomotil only twice a day.  +tearing of eyes, runny nose.      MEDICAL HISTORY  1. Alcohol Use  2. Cerebellar Stroke (2017), no residual deficits   3. CAD  4. CPPD  5. GERD  6. HTN  7. Hypothyroidism (acquired following ENT cancer treatment)  8. 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   9. Bilateral total knee replacement  10. Right side sciatica     Allergies   Allergen Reactions    Hydrocodone Hives     Medications reviewed in the EMR    SOCIAL HISTORY  He lives alone in [place]. 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 He reports quitting alcohol 1 week prior to hospital admission April 2023. He drinks a sixpack of alcohol per day. No drug use.  Sister lives in [place].  Father lives on same land in another house.    PHYSICAL EXAM  There were no vitals taken for this visit.   GENERAL: well developed, well nourished, in no distress  PSYCH: full and appropriate range of affect with good insight and judgement  HEENT: NCAT, pupils equal, sclerae anicteric  EXT: warm and well perfused, no edema  SKIN: incredibly dry skin, palms of hands, soles of feet.  +peeling.  Some toenails loose.  OBJECTIVE DATA  LABS  Reviewed in EMR excellent   CBC, CMP reviewed, ok for ongoing treatment    RADIOLOGY RESULTS   None today Resp 18  - Ht 182.9 cm (6')  - Wt 85.9 kg (189 lb 6.4 oz)  - SpO2 98%  - BMI 25.69 kg/m??    GENERAL: well developed, well nourished, in no distress  PSYCH: full and appropriate range of affect with good insight and judgement  HEENT: NCAT, pupils equal, sclerae anicteric  EXT: warm and well perfused, no edema  SKIN: incredibly dry skin, palms of hands, soles of feet.  +peeling.  Some toenails loose.    OBJECTIVE DATA  LABS  Reviewed in EMR excellent   CBC, CMP reviewed, ok for ongoing treatment    RADIOLOGY RESULTS   None today

## 2022-04-17 ENCOUNTER — Other Ambulatory Visit: Admit: 2022-04-17 | Discharge: 2022-04-17 | Payer: PRIVATE HEALTH INSURANCE

## 2022-04-17 ENCOUNTER — Ambulatory Visit: Admit: 2022-04-17 | Discharge: 2022-04-17 | Payer: PRIVATE HEALTH INSURANCE

## 2022-04-17 DIAGNOSIS — C181 Malignant neoplasm of appendix: Principal | ICD-10-CM

## 2022-04-17 DIAGNOSIS — L271 Localized skin eruption due to drugs and medicaments taken internally: Principal | ICD-10-CM

## 2022-04-17 DIAGNOSIS — Z09 Encounter for follow-up examination after completed treatment for conditions other than malignant neoplasm: Principal | ICD-10-CM

## 2022-04-17 DIAGNOSIS — Z79899 Other long term (current) drug therapy: Secondary | ICD-10-CM | POA: Diagnosis not present

## 2022-04-17 DIAGNOSIS — B084 Enteroviral vesicular stomatitis with exanthem: Secondary | ICD-10-CM | POA: Diagnosis not present

## 2022-04-17 DIAGNOSIS — I10 Essential (primary) hypertension: Secondary | ICD-10-CM | POA: Diagnosis not present

## 2022-04-17 DIAGNOSIS — Z8673 Personal history of transient ischemic attack (TIA), and cerebral infarction without residual deficits: Secondary | ICD-10-CM | POA: Diagnosis not present

## 2022-04-17 DIAGNOSIS — I2510 Atherosclerotic heart disease of native coronary artery without angina pectoris: Secondary | ICD-10-CM | POA: Diagnosis not present

## 2022-04-17 DIAGNOSIS — Z87891 Personal history of nicotine dependence: Secondary | ICD-10-CM | POA: Diagnosis not present

## 2022-04-17 DIAGNOSIS — Z885 Allergy status to narcotic agent status: Secondary | ICD-10-CM | POA: Diagnosis not present

## 2022-04-17 DIAGNOSIS — Z9049 Acquired absence of other specified parts of digestive tract: Secondary | ICD-10-CM | POA: Diagnosis not present

## 2022-04-17 DIAGNOSIS — R197 Diarrhea, unspecified: Secondary | ICD-10-CM | POA: Diagnosis not present

## 2022-04-17 DIAGNOSIS — Z6825 Body mass index (BMI) 25.0-25.9, adult: Secondary | ICD-10-CM | POA: Diagnosis not present

## 2022-04-17 DIAGNOSIS — Z923 Personal history of irradiation: Secondary | ICD-10-CM | POA: Diagnosis not present

## 2022-04-17 DIAGNOSIS — Z96653 Presence of artificial knee joint, bilateral: Secondary | ICD-10-CM | POA: Diagnosis not present

## 2022-04-17 DIAGNOSIS — R918 Other nonspecific abnormal finding of lung field: Secondary | ICD-10-CM | POA: Diagnosis not present

## 2022-04-17 LAB — CBC W/ AUTO DIFF
BASOPHILS ABSOLUTE COUNT: 0.1 10*9/L (ref 0.0–0.1)
BASOPHILS RELATIVE PERCENT: 1 %
EOSINOPHILS ABSOLUTE COUNT: 0.2 10*9/L (ref 0.0–0.5)
EOSINOPHILS RELATIVE PERCENT: 3.6 %
HEMATOCRIT: 40.2 % (ref 39.0–48.0)
HEMOGLOBIN: 14 g/dL (ref 12.9–16.5)
LYMPHOCYTES ABSOLUTE COUNT: 1.4 10*9/L (ref 1.1–3.6)
LYMPHOCYTES RELATIVE PERCENT: 26.1 %
MEAN CORPUSCULAR HEMOGLOBIN CONC: 34.8 g/dL (ref 32.0–36.0)
MEAN CORPUSCULAR HEMOGLOBIN: 33.7 pg — ABNORMAL HIGH (ref 25.9–32.4)
MEAN CORPUSCULAR VOLUME: 96.7 fL — ABNORMAL HIGH (ref 77.6–95.7)
MEAN PLATELET VOLUME: 6.6 fL — ABNORMAL LOW (ref 6.8–10.7)
MONOCYTES ABSOLUTE COUNT: 0.6 10*9/L (ref 0.3–0.8)
MONOCYTES RELATIVE PERCENT: 11.9 %
NEUTROPHILS ABSOLUTE COUNT: 3.1 10*9/L (ref 1.8–7.8)
NEUTROPHILS RELATIVE PERCENT: 57.4 %
PLATELET COUNT: 516 10*9/L — ABNORMAL HIGH (ref 150–450)
RED BLOOD CELL COUNT: 4.16 10*12/L — ABNORMAL LOW (ref 4.26–5.60)
RED CELL DISTRIBUTION WIDTH: 16.4 % — ABNORMAL HIGH (ref 12.2–15.2)
WBC ADJUSTED: 5.3 10*9/L (ref 3.6–11.2)

## 2022-04-17 LAB — COMPREHENSIVE METABOLIC PANEL
ALBUMIN: 3.7 g/dL (ref 3.4–5.0)
ALKALINE PHOSPHATASE: 91 U/L (ref 46–116)
ALT (SGPT): 16 U/L (ref 10–49)
ANION GAP: 7 mmol/L (ref 5–14)
AST (SGOT): 19 U/L (ref <=34)
BILIRUBIN TOTAL: 0.4 mg/dL (ref 0.3–1.2)
BLOOD UREA NITROGEN: 12 mg/dL (ref 9–23)
BUN / CREAT RATIO: 15
CALCIUM: 9.5 mg/dL (ref 8.7–10.4)
CHLORIDE: 102 mmol/L (ref 98–107)
CO2: 26 mmol/L (ref 20.0–31.0)
CREATININE: 0.79 mg/dL
EGFR CKD-EPI (2021) MALE: >90 mL/min/{1.73_m2} (ref >=60)
GLUCOSE RANDOM: 107 mg/dL (ref 70–179)
POTASSIUM: 4.4 mmol/L (ref 3.4–4.8)
PROTEIN TOTAL: 7.4 g/dL (ref 5.7–8.2)
SODIUM: 135 mmol/L (ref 135–145)

## 2022-04-17 MED ORDER — LOPERAMIDE 2 MG CAPSULE
ORAL_CAPSULE | 1 refills | 0 days | Status: CP
Start: 2022-04-17 — End: ?

## 2022-04-17 MED ORDER — DIPHENOXYLATE-ATROPINE 2.5 MG-0.025 MG TABLET
ORAL_TABLET | Freq: Four times a day (QID) | ORAL | 0 refills | 8 days | Status: CP
Start: 2022-04-17 — End: ?

## 2022-04-17 NOTE — Unmapped (Signed)
Telephone call from patient to reschedule his 9/19 appointment. Patient says he cant make it due to just starting back at work. I offered the patient the next available date in October and he said he can't make it. He asked for a Friday and we dont have clinic on Friday.

## 2022-04-20 NOTE — Unmapped (Signed)
Returned call to pt.Let him know he can contact [facility] to see about arranging delivery of Xeloda.Asked him to call back with any issues getting the drug or with any further questions.

## 2022-04-20 NOTE — Unmapped (Signed)
Hi,     Sergio Perez contacted the Communication Center requesting to speak with the care team of [facility] Sergio Perez to discuss:    Patient states he needs refill of his chemo medication but he could  not remember the name.    Please contact Devonn  at [phone].    Thank you,   Yehuda Mao  [facility] Communication [facility]   [phone]

## 2022-04-21 NOTE — Unmapped (Signed)
[facility] Refill Coordination Note    Specialty Medication(s) to be Shipped:   Hematology/Oncology: Capecitabine 500mg , directions: Take 4 tablets (2,000 mg total) by mouth Two (2) times a day. Take on days 1-14 only followed by 7 days off each 21-day cycle.    Other medication(s) to be shipped: No additional medications requested for fill at this time     Sergio Perez, DOB: 12-19-1960  Phone: [phone] (home)       All above HIPAA information was verified with patient.     Was a Nurse, learning disability used for this call? No    Completed refill call assessment today to schedule patient's medication shipment from the [facility] [phone]).  All relevant notes have been reviewed.     Specialty medication(s) and dose(s) confirmed: Regimen is correct and unchanged.   Changes to medications: Maud reports no changes at this time.  Changes to insurance: No  New side effects reported not previously addressed with a pharmacist or physician: None reported  Questions for the pharmacist: No    Confirmed patient received a Conservation officer, historic buildings and a Surveyor, mining with first shipment. The patient will receive a drug information handout for each medication shipped and additional FDA Medication Guides as required.       DISEASE/MEDICATION-SPECIFIC INFORMATION        N/A    SPECIALTY MEDICATION ADHERENCE     Medication Adherence    Patient reported X missed doses in the last month: 3  Specialty Medication: Capecitabine 500mg   Patient is on additional specialty medications: No  Informant: patient                       Were doses missed due to medication being on hold? No    Capecitabine 500 mg: 2 days of medicine on hand New cycle starts 04/21/22      REFERRAL TO PHARMACIST     Referral to the pharmacist: Not needed      [facility]     Shipping address confirmed in Epic.     Delivery Scheduled: Yes, Expected medication delivery date: 04/22/22.     Medication will be delivered via UPS to the prescription address in Epic [place].    Wyatt Mage M Elisabeth Cara   [facility] Specialty Technician

## 2022-04-22 NOTE — Unmapped (Signed)
[facility] Navigation: Follow-Up  Completed with:Patient      Oncology Patient Navigator (OPN) provided follow-up call to review previously discussed resources/identified barriers and evaluate current needs. OPN provided active and empathetic listening as patient shared updates since our previous call. He states that he has had to go back to work. He states that the diarrhea has returned; however, he has medication that helps with it. He denies any significant barriers to care and expressed gratitude for the call.      Patient endorsed understanding of upcoming appointment times/location as well as how to contact medical team if needed.     Additional follow-up call will occur in the coming weeks.

## 2022-05-06 NOTE — Unmapped (Signed)
[facility] Central Navigation: Follow-Up  Completed with: Patient      OPN received a call from patient inquiring about his upcoming appointment. He inquired as to whom he should contact in order to cancel his appointment. OPN provided the patient with the triage number to call.    Patient endorsed understanding of upcoming appointment times/location as well as how to contact medical team if needed.     Additional follow-up call will occur in the coming weeks.

## 2022-05-06 NOTE — Unmapped (Signed)
[facility] Refill Coordination Note    Specialty Medication(s) to be Shipped:   Hematology/Oncology: Capecitabine 500mg , directions: Take 3 tablets (1,500 mg total) by mouth Two (2) times a day.    Other medication(s) to be shipped: No additional medications requested for fill at this time     Sergio Perez, DOB: 02/09/1961  Phone: [phone] (home)       All above HIPAA information was verified with patient.     Was a Nurse, learning disability used for this call? No    Completed refill call assessment today to schedule patient's medication shipment from the [facility] [phone]).  All relevant notes have been reviewed.     Specialty medication(s) and dose(s) confirmed: Regimen is correct and unchanged.   Changes to medications: [name] reports no changes at this time.  Changes to insurance: No  New side effects reported not previously addressed with a pharmacist or physician: None reported  Questions for the pharmacist: No    Confirmed patient received a Conservation officer, historic buildings and a Surveyor, mining with first shipment. The patient will receive a drug information handout for each medication shipped and additional FDA Medication Guides as required.       DISEASE/MEDICATION-SPECIFIC INFORMATION        N/A    SPECIALTY MEDICATION ADHERENCE     Medication Adherence    Patient reported X missed doses in the last month: 1  Specialty Medication: Capecitabine 500mg   Patient is on additional specialty medications: No  Informant: patient                       Were doses missed due to medication being on hold? No    Capecitabine 500 mg: 3 days of medicine on hand New cycle starts 05/12/22      REFERRAL TO PHARMACIST     Referral to the pharmacist: Not needed      [facility]     Shipping address confirmed in Epic.     Delivery Scheduled: Yes, Expected medication delivery date: 05/08/22.     Medication will be delivered via UPS to the prescription address in Epic [place].    Sergio Perez   [facility] Specialty Technician

## 2022-05-06 NOTE — Unmapped (Signed)
I spoke with patient Sergio Perez to confirm appointments on the following date(s): 05/22/22 lab appt at 9am.    Estevan Oaks

## 2022-05-07 MED FILL — CAPECITABINE 500 MG TABLET: ORAL | 21 days supply | Qty: 84 | Fill #1

## 2022-05-08 ENCOUNTER — Encounter: Payer: Self-pay | Admitting: Family

## 2022-05-08 ENCOUNTER — Ambulatory Visit: Payer: BC Managed Care – PPO | Admitting: Family

## 2022-05-08 ENCOUNTER — Other Ambulatory Visit: Payer: Self-pay | Admitting: Family Medicine

## 2022-05-08 VITALS — BP 138/90 | HR 82 | Temp 97.9°F | Resp 16 | Ht 72.0 in | Wt 191.1 lb

## 2022-05-08 DIAGNOSIS — H6121 Impacted cerumen, right ear: Secondary | ICD-10-CM | POA: Insufficient documentation
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
## 2022-05-08 NOTE — Assessment & Plan Note (Signed)
Ceruminosis is noted.  Obtained verbal patient consent prior to procedure, possible risks of procedure discussed with pt prior, and then Wax was removed by syringing/irrigation and manual debridement was performed by me with curette. Instructions for home care to prevent wax buildup are given and handout provided to pt .pt tolerated procedure well.   

## 2022-05-19 NOTE — Unmapped (Signed)
Telephone call to patient regarding rescheduling his appointment with IP. I called both the mobile and home # but was not able to reach the patient or leave a message for a callback.

## 2022-05-20 NOTE — Unmapped (Signed)
I called and spoke to Mr. Damian Leavell, he was unsure about missed appointments but would like to schedule follow up with pulmonology.  He asked if possible for this Friday, happy to give them a call and see about scheduling appointment asap.  -IB sent to med onc team.      RN Navigator following up with pulmonology scheduler regarding new patient appointment, unfortunately unable to get in touch with anyone to help at this time.  Will continue to try and update team.

## 2022-05-21 ENCOUNTER — Other Ambulatory Visit: Payer: Self-pay | Admitting: Family Medicine

## 2022-05-21 DIAGNOSIS — I10 Essential (primary) hypertension: Secondary | ICD-10-CM

## 2022-05-21 MED ORDER — DIPHENOXYLATE-ATROPINE 2.5 MG-0.025 MG TABLET
ORAL_TABLET | Freq: Four times a day (QID) | ORAL | 0 refills | 8 days | Status: CP
Start: 2022-05-21 — End: ?

## 2022-05-21 NOTE — Unmapped (Signed)
[facility] Navigation: Follow-Up  Completed with: Patient      Oncology Patient Navigator (OPN) received a call from the patient inquiring about his appointment time tomorrow. OPN provided the requested information. He reports that he is ???doing ok, just backed up.??? However, he plans to discuss his constipation with his doctor tomorrow. OPN insured he had the triage line if he had any immediate needs.      Patient endorsed understanding of upcoming appointment times/location as well as how to contact medical team if needed.     Additional follow-up call will occur in the coming weeks.

## 2022-05-22 ENCOUNTER — Ambulatory Visit: Admit: 2022-05-22 | Discharge: 2022-05-23 | Payer: PRIVATE HEALTH INSURANCE

## 2022-05-22 ENCOUNTER — Other Ambulatory Visit: Admit: 2022-05-22 | Discharge: 2022-05-23 | Payer: PRIVATE HEALTH INSURANCE

## 2022-05-22 DIAGNOSIS — K521 Toxic gastroenteritis and colitis: Principal | ICD-10-CM

## 2022-05-22 DIAGNOSIS — C181 Malignant neoplasm of appendix: Principal | ICD-10-CM

## 2022-05-22 DIAGNOSIS — T451X5A Adverse effect of antineoplastic and immunosuppressive drugs, initial encounter: Principal | ICD-10-CM

## 2022-05-22 DIAGNOSIS — L271 Localized skin eruption due to drugs and medicaments taken internally: Principal | ICD-10-CM

## 2022-05-22 DIAGNOSIS — R911 Solitary pulmonary nodule: Principal | ICD-10-CM

## 2022-05-22 DIAGNOSIS — Z09 Encounter for follow-up examination after completed treatment for conditions other than malignant neoplasm: Principal | ICD-10-CM

## 2022-05-22 DIAGNOSIS — I2510 Atherosclerotic heart disease of native coronary artery without angina pectoris: Secondary | ICD-10-CM | POA: Diagnosis not present

## 2022-05-22 DIAGNOSIS — I10 Essential (primary) hypertension: Secondary | ICD-10-CM | POA: Diagnosis not present

## 2022-05-22 DIAGNOSIS — R197 Diarrhea, unspecified: Secondary | ICD-10-CM | POA: Diagnosis not present

## 2022-05-22 DIAGNOSIS — Z87891 Personal history of nicotine dependence: Secondary | ICD-10-CM | POA: Diagnosis not present

## 2022-05-22 DIAGNOSIS — R110 Nausea: Secondary | ICD-10-CM | POA: Diagnosis not present

## 2022-05-22 LAB — CBC W/ AUTO DIFF
BASOPHILS ABSOLUTE COUNT: 0 10*9/L (ref 0.0–0.1)
BASOPHILS RELATIVE PERCENT: 0.7 %
EOSINOPHILS ABSOLUTE COUNT: 0.2 10*9/L (ref 0.0–0.5)
EOSINOPHILS RELATIVE PERCENT: 3.5 %
HEMATOCRIT: 43.3 % (ref 39.0–48.0)
HEMOGLOBIN: 14.9 g/dL (ref 12.9–16.5)
LYMPHOCYTES ABSOLUTE COUNT: 1.5 10*9/L (ref 1.1–3.6)
LYMPHOCYTES RELATIVE PERCENT: 30.4 %
MEAN CORPUSCULAR HEMOGLOBIN CONC: 34.3 g/dL (ref 32.0–36.0)
MEAN CORPUSCULAR HEMOGLOBIN: 34.5 pg — ABNORMAL HIGH (ref 25.9–32.4)
MEAN CORPUSCULAR VOLUME: 100.6 fL — ABNORMAL HIGH (ref 77.6–95.7)
MEAN PLATELET VOLUME: 7.1 fL (ref 6.8–10.7)
MONOCYTES ABSOLUTE COUNT: 0.4 10*9/L (ref 0.3–0.8)
MONOCYTES RELATIVE PERCENT: 7.6 %
NEUTROPHILS ABSOLUTE COUNT: 2.9 10*9/L (ref 1.8–7.8)
NEUTROPHILS RELATIVE PERCENT: 57.8 %
PLATELET COUNT: 216 10*9/L (ref 150–450)
RED BLOOD CELL COUNT: 4.31 10*12/L (ref 4.26–5.60)
RED CELL DISTRIBUTION WIDTH: 16.3 % — ABNORMAL HIGH (ref 12.2–15.2)
WBC ADJUSTED: 5.1 10*9/L (ref 3.6–11.2)

## 2022-05-22 LAB — COMPREHENSIVE METABOLIC PANEL
ALBUMIN: 4.5 g/dL (ref 3.4–5.0)
ALKALINE PHOSPHATASE: 64 U/L (ref 46–116)
ALT (SGPT): 15 U/L (ref 10–49)
ANION GAP: 8 mmol/L (ref 5–14)
AST (SGOT): 21 U/L (ref <=34)
BILIRUBIN TOTAL: 0.6 mg/dL (ref 0.3–1.2)
BLOOD UREA NITROGEN: 12 mg/dL (ref 9–23)
BUN / CREAT RATIO: 13
CALCIUM: 10 mg/dL (ref 8.7–10.4)
CHLORIDE: 104 mmol/L (ref 98–107)
CO2: 27 mmol/L (ref 20.0–31.0)
CREATININE: 0.96 mg/dL
EGFR CKD-EPI (2021) MALE: 89 mL/min/{1.73_m2} (ref >=60)
GLUCOSE RANDOM: 135 mg/dL (ref 70–179)
POTASSIUM: 5 mmol/L — ABNORMAL HIGH (ref 3.4–4.8)
PROTEIN TOTAL: 7.9 g/dL (ref 5.7–8.2)
SODIUM: 139 mmol/L (ref 135–145)

## 2022-05-22 NOTE — Unmapped (Signed)
[facility]     PRIMARY CARE PROVIDER:  Eustaquio Boyden, MD  [address] [place] [address]    CONSULTING PROVIDERS  Dr. Thornton Papas, Medical Oncology, [facility]  Dr. 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 11/15/21: Right hemicolectomy: G3 poorly diff goblet cell adenocarcinoma with focal mucinous diff, 2.4cm through visceral peritoneum, 2/19 nodes, 5 tumor deposits, pT4aN1b.   [facility] NGS: mutations in  TP53, ATM, MTHFR, SH2B3, MSH2, SRP14, ATRX, LRP1B. TMB 4.7, MSS  2. 12/31/21 adjuvant capecitabine start.   - c1 complicated by HFS.   - c2 dose reduced to 2g BID, still with HFS but shorter and less severe  - c5 delayed, dose reduced to 1500mg  BID    Treatment plan:  Adjuvant capecitabine x 8 cycles  ____________________________________________________________________    ASSESSMENT  1. Stage IIIB mucinous appendiceal adenocarcinoma  2. AEs: HFS pain without peeling, diarrhea   3. Indeterminate lower lung nodules - concern for slow growing malignancy    RECOMMENDATIONS  1. Capecitabine at 1500 mg bid - 10/31 start cycle 8, final cycle  2. Urea cream for hand/foot syndrome, sparing use of oxycodone  3  lomotil 1-2 q6prn  4. RTC 3 months with CT scan, labs, visit  5. Referral to [facility] - scheduled 9/19 - he canceled for the second time.  He is trying to get an appt to make one trip.  He knows to work directly with scheduling to get appt rescheduled.      DISCUSSION  Sergio Perez back to struggling with HFS, hasn't been keeping up with skin care.  Will stop this cycle 3 days early, which will give him 10 day break before next cycle.  He would like to push on and complete all 8 cycles.      Discussed that after completing 8 cycles of treatment, will transition to surveillance with q16month visits, with yearly imaging.    This was high risk medical decision making on the basis of advanced cancer and treatment with high risk for complication requiring ongoing careful monitoring for severe toxicity.     ______________________________________________________________________  HISTORY     Sergio Perez is seen today at the [facility] for ongoing management regarding appendiceal adenocarcinoma     He finishes this cycle on Monday.    Hands and feet, hands are dry, peeling, come fissures in creases.  Has not been using his skin cream lately.  Eating ok.  Not much nausea.  +diarrhea.  Was constipated for 3 days earlier this week, and now having more loose stools.  Uses lomotil for diarrhea about twice a day.  +tearing of eyes, runny nose.      MEDICAL HISTORY  1. Alcohol Use  2. Cerebellar Stroke (2017), no residual deficits   3. CAD  4. CPPD  5. GERD  6. HTN  7. Hypothyroidism (acquired following ENT cancer treatment)  8. 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   9. Bilateral total knee replacement  10. Right side sciatica     Allergies   Allergen Reactions    Hydrocodone Hives     Medications reviewed in the EMR    SOCIAL HISTORY  He lives alone in [place]. He works as a Teaching laboratory technician. He quit smoking cigarettes  in 2011. He reports quitting alcohol 1 week prior to hospital admission April 2023. He drinks a sixpack of alcohol per day. No drug use.  Sister lives in [place].  Father lives on same land in another house.    PHYSICAL EXAM  BP 135/89  - Pulse 72  - Temp 36.4 ??C (97.5 ??F) (Oral)  - Resp 16  - Ht 182.9 cm (6')  - Wt 89.6 kg (197 lb 8 oz)  - SpO2 99%  - BMI 26.79 kg/m??    GENERAL: well developed, well nourished, in no distress  PSYCH: full and appropriate range of affect with good insight and judgement  HEENT: NCAT, pupils equal, sclerae anicteric  EXT: warm and well perfused, no edema  SKIN: incredibly dry skin, palms of hands, soles of feet.  +peeling.  Some toenails, has lost some and fingernails loose.    OBJECTIVE DATA  LABS  Reviewed in EMR excellent   CBC, CMP reviewed, ok for ongoing treatment    RADIOLOGY RESULTS   None today

## 2022-06-04 NOTE — Unmapped (Signed)
[facility] Refill Coordination Note    Specialty Medication(s) to be Shipped:   Hematology/Oncology: capecitabine    Other medication(s) to be shipped: No additional medications requested for fill at this time     Sergio Perez, DOB: 24-May-1961  Phone: [phone] (home)       All above HIPAA information was verified with patient.     Was a Nurse, learning disability used for this call? No    Completed refill call assessment today to schedule patient's medication shipment from the [facility] [phone]).  All relevant notes have been reviewed.     Specialty medication(s) and dose(s) confirmed: Regimen is correct and unchanged.   Changes to medications: Sergio Perez reports no changes at this time.  Changes to insurance: No  New side effects reported not previously addressed with a pharmacist or physician: None reported  Questions for the pharmacist: No    Confirmed patient received a Conservation officer, historic buildings and a Surveyor, mining with first shipment. The patient will receive a drug information handout for each medication shipped and additional FDA Medication Guides as required.       DISEASE/MEDICATION-SPECIFIC INFORMATION        N/A    SPECIALTY MEDICATION ADHERENCE     Medication Adherence    Patient reported X missed doses in the last month: 0  Specialty Medication: Capecitabine 500mg   Patient is on additional specialty medications: No  Informant: patient                                Were doses missed due to medication being on hold? No    capecitabine 500 mg: 4 days of medicine on hand        REFERRAL TO PHARMACIST     Referral to the pharmacist: Not needed      [facility]     Shipping address confirmed in Epic.     Delivery Scheduled: Yes, Expected medication delivery date: 06/08/22.     Medication will be delivered via UPS to the prescription address in Epic WAM.    Unk Lightning   [facility] Specialty Technician

## 2022-06-05 MED FILL — CAPECITABINE 500 MG TABLET: ORAL | 21 days supply | Qty: 84 | Fill #2

## 2022-06-19 NOTE — Unmapped (Signed)
[facility] Navigation: Follow-Up  Completed with: Patient      Oncology Patient Navigator (OPN) provided follow-up call to review previously discussed resources/identified barriers and evaluate current needs. OPN provided active and empathetic listening as patient shared updates since our previous call. He states that he is ???doing well??? and that he finished his last chemo treatment on Wednesday. He denies any significant barrier to care currently and expressed gratitude for the call.     Patient endorsed understanding of upcoming appointment times/location as well as how to contact medical team if needed. Patient denied any additional needs at this time and verbalized understanding of how to contact OPN if needs arise in the future. No additional follow-up scheduled.    Please message our program through In Basket [facility] Oncology Navigation) as appropriate/needed.

## 2022-06-29 NOTE — Unmapped (Signed)
Specialty Medication(s): capecitabine    [name] has been dis-enrolled from the [facility] specialty pharmacy services due to therapy completion - expected therapy completion date: November 2023 .    Additional information provided to the patient: [name] reporting a slight discomfort in groin area.  Advised to try acetaminophen or apply diclofenac gel to area.  If worsens then he will reach out to his provider.    Kermit Balo, [facility]  [facility] Specialty Pharmacist

## 2022-07-01 ENCOUNTER — Observation Stay (HOSPITAL_BASED_OUTPATIENT_CLINIC_OR_DEPARTMENT_OTHER)
Admit: 2022-07-01 | Discharge: 2022-07-01 | Disposition: A | Discharge status: Home / Self Care | Payer: BC Managed Care – PPO | Attending: Internal Medicine | Admitting: Internal Medicine

## 2022-07-01 ENCOUNTER — Observation Stay
Admission: EM | Admit: 2022-07-01 | Discharge: 2022-07-02 | Disposition: A | Discharge status: Home / Self Care | Payer: BC Managed Care – PPO | Attending: Internal Medicine | Admitting: Internal Medicine

## 2022-07-01 ENCOUNTER — Encounter: Payer: Self-pay | Admitting: Emergency Medicine

## 2022-07-01 ENCOUNTER — Observation Stay: Payer: BC Managed Care – PPO

## 2022-07-01 ENCOUNTER — Emergency Department: Payer: BC Managed Care – PPO

## 2022-07-01 ENCOUNTER — Other Ambulatory Visit: Payer: Self-pay

## 2022-07-01 DIAGNOSIS — I63511 Cerebral infarction due to unspecified occlusion or stenosis of right middle cerebral artery: Secondary | ICD-10-CM | POA: Diagnosis present

## 2022-07-01 DIAGNOSIS — F5104 Psychophysiologic insomnia: Secondary | ICD-10-CM | POA: Diagnosis present

## 2022-07-01 DIAGNOSIS — I639 Cerebral infarction, unspecified: Secondary | ICD-10-CM | POA: Diagnosis not present

## 2022-07-01 DIAGNOSIS — M79661 Pain in right lower leg: Secondary | ICD-10-CM | POA: Insufficient documentation

## 2022-07-01 DIAGNOSIS — Z789 Other specified health status: Secondary | ICD-10-CM | POA: Diagnosis present

## 2022-07-01 DIAGNOSIS — Z79899 Other long term (current) drug therapy: Secondary | ICD-10-CM | POA: Diagnosis not present

## 2022-07-01 DIAGNOSIS — R29898 Other symptoms and signs involving the musculoskeletal system: Secondary | ICD-10-CM | POA: Diagnosis not present

## 2022-07-01 DIAGNOSIS — E039 Hypothyroidism, unspecified: Secondary | ICD-10-CM | POA: Insufficient documentation

## 2022-07-01 DIAGNOSIS — Z7902 Long term (current) use of antithrombotics/antiplatelets: Secondary | ICD-10-CM | POA: Diagnosis not present

## 2022-07-01 DIAGNOSIS — Z87891 Personal history of nicotine dependence: Secondary | ICD-10-CM | POA: Diagnosis not present

## 2022-07-01 DIAGNOSIS — K219 Gastro-esophageal reflux disease without esophagitis: Secondary | ICD-10-CM | POA: Diagnosis present

## 2022-07-01 DIAGNOSIS — R29703 NIHSS score 3: Secondary | ICD-10-CM | POA: Diagnosis not present

## 2022-07-01 DIAGNOSIS — N400 Enlarged prostate without lower urinary tract symptoms: Secondary | ICD-10-CM | POA: Diagnosis present

## 2022-07-01 DIAGNOSIS — Z96653 Presence of artificial knee joint, bilateral: Secondary | ICD-10-CM | POA: Insufficient documentation

## 2022-07-01 DIAGNOSIS — I10 Essential (primary) hypertension: Secondary | ICD-10-CM | POA: Diagnosis not present

## 2022-07-01 DIAGNOSIS — I63311 Cerebral infarction due to thrombosis of right middle cerebral artery: Principal | ICD-10-CM | POA: Insufficient documentation

## 2022-07-01 DIAGNOSIS — M79604 Pain in right leg: Secondary | ICD-10-CM | POA: Diagnosis not present

## 2022-07-01 DIAGNOSIS — F411 Generalized anxiety disorder: Secondary | ICD-10-CM | POA: Diagnosis present

## 2022-07-01 DIAGNOSIS — M79606 Pain in leg, unspecified: Secondary | ICD-10-CM | POA: Diagnosis present

## 2022-07-01 DIAGNOSIS — E781 Pure hyperglyceridemia: Secondary | ICD-10-CM | POA: Diagnosis not present

## 2022-07-01 DIAGNOSIS — M79605 Pain in left leg: Secondary | ICD-10-CM | POA: Diagnosis not present

## 2022-07-01 DIAGNOSIS — I2510 Atherosclerotic heart disease of native coronary artery without angina pectoris: Secondary | ICD-10-CM | POA: Insufficient documentation

## 2022-07-01 DIAGNOSIS — M1711 Unilateral primary osteoarthritis, right knee: Secondary | ICD-10-CM | POA: Insufficient documentation

## 2022-07-01 DIAGNOSIS — R202 Paresthesia of skin: Secondary | ICD-10-CM

## 2022-07-01 DIAGNOSIS — R2990 Unspecified symptoms and signs involving the nervous system: Secondary | ICD-10-CM

## 2022-07-01 DIAGNOSIS — I63233 Cerebral infarction due to unspecified occlusion or stenosis of bilateral carotid arteries: Secondary | ICD-10-CM | POA: Diagnosis not present

## 2022-07-01 DIAGNOSIS — C181 Malignant neoplasm of appendix: Secondary | ICD-10-CM | POA: Insufficient documentation

## 2022-07-01 DIAGNOSIS — R200 Anesthesia of skin: Secondary | ICD-10-CM | POA: Diagnosis not present

## 2022-07-01 DIAGNOSIS — M79662 Pain in left lower leg: Secondary | ICD-10-CM | POA: Diagnosis not present

## 2022-07-01 DIAGNOSIS — R531 Weakness: Secondary | ICD-10-CM | POA: Diagnosis not present

## 2022-07-01 LAB — COMPREHENSIVE METABOLIC PANEL
ALT: 18 U/L (ref 0–44)
AST: 25 U/L (ref 15–41)
Albumin: 4.5 g/dL (ref 3.5–5.0)
Alkaline Phosphatase: 57 U/L (ref 38–126)
Anion gap: 10 (ref 5–15)
BUN: 15 mg/dL (ref 8–23)
CO2: 26 mmol/L (ref 22–32)
Calcium: 9.7 mg/dL (ref 8.9–10.3)
Chloride: 101 mmol/L (ref 98–111)
Creatinine, Ser: 0.89 mg/dL (ref 0.61–1.24)
GFR, Estimated: >60 mL/min (ref >60)
Glucose, Bld: 102 mg/dL — ABNORMAL HIGH (ref 70–99)
Potassium: 4.2 mmol/L (ref 3.5–5.1)
Sodium: 137 mmol/L (ref 135–145)
Total Bilirubin: 0.9 mg/dL (ref 0.3–1.2)
Total Protein: 7.9 g/dL (ref 6.5–8.1)

## 2022-07-01 LAB — DIFFERENTIAL
Abs Immature Granulocytes: 0.03 10*3/uL (ref 0.00–0.07)
Basophils Absolute: 0.1 10*3/uL (ref 0.0–0.1)
Basophils Relative: 1 %
Eosinophils Absolute: 0.2 10*3/uL (ref 0.0–0.5)
Eosinophils Relative: 3 %
Immature Granulocytes: 0 %
Lymphocytes Relative: 20 %
Lymphs Abs: 1.4 10*3/uL (ref 0.7–4.0)
Monocytes Absolute: 0.9 10*3/uL (ref 0.1–1.0)
Monocytes Relative: 13 %
Neutro Abs: 4.4 10*3/uL (ref 1.7–7.7)
Neutrophils Relative %: 63 %

## 2022-07-01 LAB — ECHOCARDIOGRAM COMPLETE
AR max vel: 4.08 cm2
AV Area VTI: 4.57 cm2
AV Area mean vel: 3.57 cm2
AV Mean grad: 3 mmHg
AV Peak grad: 4.6 mmHg
Ao pk vel: 1.07 m/s
Area-P 1/2: 3.76 cm2
S' Lateral: 3.5 cm

## 2022-07-01 LAB — URINE DRUG SCREEN, QUALITATIVE (ARMC ONLY)
Amphetamines, Ur Screen: NOT DETECTED
Barbiturates, Ur Screen: NOT DETECTED
Benzodiazepine, Ur Scrn: POSITIVE — AB
Cannabinoid 50 Ng, Ur ~~LOC~~: POSITIVE — AB
Cocaine Metabolite,Ur ~~LOC~~: NOT DETECTED
MDMA (Ecstasy)Ur Screen: NOT DETECTED
Methadone Scn, Ur: NOT DETECTED
Opiate, Ur Screen: NOT DETECTED
Phencyclidine (PCP) Ur S: NOT DETECTED
Tricyclic, Ur Screen: POSITIVE — AB

## 2022-07-01 LAB — ETHANOL: Alcohol, Ethyl (B): <10 mg/dL (ref <10)

## 2022-07-01 LAB — CBC
HCT: 41.2 % (ref 39.0–52.0)
Hemoglobin: 14.3 g/dL (ref 13.0–17.0)
MCH: 32.9 pg (ref 26.0–34.0)
MCHC: 34.7 g/dL (ref 30.0–36.0)
MCV: 94.9 fL (ref 80.0–100.0)
Platelets: 209 10*3/uL (ref 150–400)
RBC: 4.34 MIL/uL (ref 4.22–5.81)
RDW: 14.2 % (ref 11.5–15.5)
WBC: 7 10*3/uL (ref 4.0–10.5)
nRBC: 0 % (ref 0.0–0.2)

## 2022-07-01 LAB — CBG MONITORING, ED: Glucose-Capillary: 94 mg/dL (ref 70–99)

## 2022-07-01 LAB — PROTIME-INR
INR: 1 (ref 0.8–1.2)
Prothrombin Time: 13 seconds (ref 11.4–15.2)

## 2022-07-01 LAB — APTT: aPTT: 28 seconds (ref 24–36)

## 2022-07-01 MED ORDER — STROKE: EARLY STAGES OF RECOVERY BOOK: Freq: Once | Status: DC

## 2022-07-01 MED ORDER — GABAPENTIN 300 MG PO CAPS: 400.0000 mg | ORAL_CAPSULE | ORAL | Status: DC

## 2022-07-01 MED ORDER — GABAPENTIN 600 MG PO TABS
1200.0000 mg | ORAL_TABLET | Freq: Every day | ORAL | Status: DC
  Administered 2022-07-01: 1200 mg via ORAL
  Filled 2022-07-01: qty 2

## 2022-07-01 MED ORDER — FENOFIBRATE 160 MG PO TABS
160.0000 mg | ORAL_TABLET | Freq: Every day | ORAL | Status: DC
  Filled 2022-07-01: qty 1

## 2022-07-01 MED ORDER — LABETALOL HCL 5 MG/ML IV SOLN: 5.0000 mg | INTRAVENOUS | Status: DC | PRN

## 2022-07-01 MED ORDER — ACETAMINOPHEN 160 MG/5ML PO SOLN: 650.0000 mg | ORAL | Status: DC | PRN

## 2022-07-01 MED ORDER — ATORVASTATIN CALCIUM 20 MG PO TABS
80.0000 mg | ORAL_TABLET | Freq: Every day | ORAL | Status: DC
  Administered 2022-07-01: 80 mg via ORAL
  Filled 2022-07-01: qty 4

## 2022-07-01 MED ORDER — ENOXAPARIN SODIUM 40 MG/0.4ML IJ SOSY
40.0000 mg | PREFILLED_SYRINGE | INTRAMUSCULAR | Status: DC
  Administered 2022-07-01: 40 mg via SUBCUTANEOUS
  Filled 2022-07-01: qty 0.4

## 2022-07-01 MED ORDER — SODIUM CHLORIDE 0.9% FLUSH
3.0000 mL | Freq: Once | INTRAVENOUS | Status: AC
  Administered 2022-07-01: 3 mL via INTRAVENOUS

## 2022-07-01 MED ORDER — LEVOTHYROXINE SODIUM 150 MCG PO TABS
150.0000 ug | ORAL_TABLET | Freq: Every day | ORAL | Status: DC
  Administered 2022-07-02: 150 ug via ORAL
  Filled 2022-07-01: qty 1

## 2022-07-01 MED ORDER — ACETAMINOPHEN 325 MG PO TABS: 650.0000 mg | ORAL_TABLET | ORAL | Status: DC | PRN

## 2022-07-01 MED ORDER — AMITRIPTYLINE HCL 25 MG PO TABS
75.0000 mg | ORAL_TABLET | Freq: Every day | ORAL | Status: DC
  Administered 2022-07-01: 75 mg via ORAL
  Filled 2022-07-01: qty 3

## 2022-07-01 MED ORDER — MELATONIN 5 MG PO TABS: 5.0000 mg | ORAL_TABLET | Freq: Every evening | ORAL | Status: DC | PRN

## 2022-07-01 MED ORDER — GABAPENTIN 400 MG PO CAPS
400.0000 mg | ORAL_CAPSULE | Freq: Two times a day (BID) | ORAL | Status: DC
  Administered 2022-07-02: 400 mg via ORAL
  Filled 2022-07-01: qty 1

## 2022-07-01 MED ORDER — ACETAMINOPHEN 650 MG RE SUPP: 650.0000 mg | RECTAL | Status: DC | PRN

## 2022-07-01 MED ORDER — IOHEXOL 350 MG/ML SOLN
100.0000 mL | Freq: Once | INTRAVENOUS | Status: AC | PRN
  Administered 2022-07-01: 100 mL via INTRAVENOUS

## 2022-07-01 MED ORDER — GADOBUTROL 1 MMOL/ML IV SOLN
9.0000 mL | Freq: Once | INTRAVENOUS | Status: AC | PRN
  Administered 2022-07-01: 9 mL via INTRAVENOUS

## 2022-07-01 MED ORDER — CLOPIDOGREL BISULFATE 75 MG PO TABS
75.0000 mg | ORAL_TABLET | Freq: Every day | ORAL | Status: DC
  Administered 2022-07-02: 75 mg via ORAL
  Filled 2022-07-01: qty 1

## 2022-07-01 MED ORDER — LORAZEPAM 2 MG/ML IJ SOLN: 0.5000 mg | Freq: Four times a day (QID) | INTRAMUSCULAR | Status: DC | PRN

## 2022-07-01 MED ORDER — SENNOSIDES-DOCUSATE SODIUM 8.6-50 MG PO TABS: 1.0000 | ORAL_TABLET | Freq: Every evening | ORAL | Status: DC | PRN

## 2022-07-01 MED ORDER — LORAZEPAM 2 MG/ML IJ SOLN: 1.0000 mg | INTRAMUSCULAR | Status: DC | PRN

## 2022-07-01 MED ORDER — AMITRIPTYLINE HCL 25 MG PO TABS: 75.0000 mg | ORAL_TABLET | Freq: Every day | ORAL | Status: DC

## 2022-07-01 MED ORDER — NICOTINE POLACRILEX 2 MG MT GUM
2.0000 mg | CHEWING_GUM | OROMUCOSAL | Status: DC | PRN
  Filled 2022-07-01: qty 2

## 2022-07-01 MED ORDER — FAMOTIDINE 20 MG PO TABS
20.0000 mg | ORAL_TABLET | Freq: Two times a day (BID) | ORAL | Status: DC
  Administered 2022-07-01 – 2022-07-02 (×2): 20 mg via ORAL
  Filled 2022-07-01 (×2): qty 1

## 2022-07-01 MED ORDER — ASPIRIN 81 MG PO TBEC
81.0000 mg | DELAYED_RELEASE_TABLET | Freq: Every day | ORAL | Status: DC
  Administered 2022-07-02: 81 mg via ORAL
  Filled 2022-07-01: qty 1

## 2022-07-01 MED ORDER — TENECTEPLASE FOR STROKE
PACK | INTRAVENOUS | Status: AC
  Filled 2022-07-01: qty 10

## 2022-07-01 MED ORDER — NAPHAZOLINE-GLYCERIN 0.012-0.25 % OP SOLN: 1.0000 [drp] | Freq: Four times a day (QID) | OPHTHALMIC | Status: DC | PRN

## 2022-07-01 MED ORDER — ASPIRIN 325 MG PO TABS
325.0000 mg | ORAL_TABLET | Freq: Once | ORAL | Status: AC
  Administered 2022-07-01: 325 mg via ORAL
  Filled 2022-07-01: qty 1

## 2022-07-01 NOTE — Progress Notes (Signed)
Code stroke cart activated at 0717-Pt in CT at this time. Pt back from CT at 0723. Telespecialist paged at [place]. Dr Sherryle Lis on screen at 0729-Results of head CT communicated.   Linden Dolin, Print production planner

## 2022-07-01 NOTE — Progress Notes (Signed)
Patient briefly seen this morning as soon as the emergent telestroke consult was completed. I agree with telestroke recommendations.  I discussed this with the telestroke provider over the phone. MRI of the brain with and without contrast given his history of cancer. If no stroke or met, can follow-up outpatient. Plan discussed with Dr. Ellender Hose.  -- Amie Portland, MD Neurologist [facility] Pager: [phone]

## 2022-07-01 NOTE — ED Provider Notes (Signed)
[facility] Provider Note    Event Date/Time   First MD Initiated Contact with Patient 07/01/22 [phone]     (approximate)   History   Code Stroke   HPI  Patrick Bailey. is a 61 y.o. male with past medical history of prior cerebellar stroke, coronary disease, hypertension, recently diagnosed adenocarcinoma of the appendix, here with left-sided numbness and tingling.  The patient states that he was in his usual state of health until around 6 to 6:30 AM today.  He states that while he was at work he developed acute onset of left-sided numbness and weakness.  He also felt some weakness in his right leg.  He woke up at 4 AM and felt normal.  Patient states that he had difficulty walking and that his leg essentially would not move or support him on the left.  He has a history of neuropathy but has never had symptoms as severe as this.  Denies any vision changes.  Denies any headaches.  He states that the symptoms have largely improved though he continues to feel some numbness on arrival to the ED.  Is activated as a code stroke on arrival.     Physical Exam   Triage Vital Signs: ED Triage Vitals  Enc Vitals Group     BP 07/01/22 0714 (!) 127/95     Pulse Rate 07/01/22 0714 75     Resp 07/01/22 0714 19     Temp 07/01/22 0714 98.1 F (36.7 C)     Temp src --      SpO2 07/01/22 0714 99 %     Weight --      Height --      Head Circumference --      Peak Flow --      Pain Score 07/01/22 0729 0     Pain Loc --      Pain Edu? --      Excl. in [place]? --     Most recent vital signs: Vitals:   07/01/22 1518 07/01/22 1539  BP:  127/83  Pulse:  84  Resp:  16  Temp: 97.6 F (36.4 C) 98 F (36.7 C)  SpO2:  97%     General: Awake, no distress.  CV:  Good peripheral perfusion.  Regular rate and rhythm.  No murmurs rubs. Resp:  Normal effort.  Lungs clear to auscultation bilaterally. Abd:  No distention.  Other:  Cranial nerves II through XII intact.  Face  is symmetric.  Extraocular movements are normal.  Strength out of 5 bilateral upper extremities on my assessment with no pronator drift.  5 out of 5 throughout lower extremities as well.  Normal sensation to light touch and pinprick bilateral distal upper and lower extremities.   ED Results / Procedures / Treatments   Labs (all labs ordered are listed, but only abnormal results are displayed) Labs Reviewed  COMPREHENSIVE METABOLIC PANEL - Abnormal; Notable for the following components:      Result Value   Glucose, Bld 102 (*)    All other components within normal limits  PROTIME-INR  APTT  CBC  DIFFERENTIAL  ETHANOL  LIPID PANEL  HEMOGLOBIN A1C  URINE DRUG SCREEN, QUALITATIVE ([facility] ONLY)  CBG MONITORING, ED     EKG Normal sinus rhythm, ventricular rate 69.  PR 156, QRS 92, QTc 468.  No acute ST elevations or depressions.  No EKG evidence of acute ischemia or infarct.   RADIOLOGY CT head: No  acute intracranial normality CT angio head/neck: Negative CT perfusion study, no intracranial large vessel occlusion, extensive soft plaque in the right carotid bifurcation with concern for acute clot, previous treatment changes of the right neck   I also independently reviewed and agree with radiologist interpretations.   PROCEDURES:  Critical Care performed: No   MEDICATIONS ORDERED IN ED: Medications   stroke: early stages of recovery book ( Does not apply Not Given 07/01/22 1338)  acetaminophen (TYLENOL) tablet 650 mg (has no administration in time range)    Or  acetaminophen (TYLENOL) 160 MG/5ML solution 650 mg (has no administration in time range)    Or  acetaminophen (TYLENOL) suppository 650 mg (has no administration in time range)  enoxaparin (LOVENOX) injection 40 mg (has no administration in time range)  senna-docusate (Senokot-S) tablet 1 tablet (has no administration in time range)  aspirin EC tablet 81 mg (has no administration in time range)  atorvastatin  (LIPITOR) tablet 80 mg (has no administration in time range)  labetalol (NORMODYNE) injection 5 mg (has no administration in time range)  melatonin tablet 5 mg (has no administration in time range)  nicotine polacrilex (NICORETTE) gum 2 mg (has no administration in time range)  LORazepam (ATIVAN) injection 1 mg (has no administration in time range)  fenofibrate tablet 160 mg (has no administration in time range)  levothyroxine (SYNTHROID) tablet 150 mcg (has no administration in time range)  famotidine (PEPCID) tablet 20 mg (has no administration in time range)  clopidogrel (PLAVIX) tablet 75 mg (has no administration in time range)  amitriptyline (ELAVIL) tablet 75 mg (has no administration in time range)  naphazoline-glycerin (CLEAR EYES REDNESS) ophth solution 1-2 drop (has no administration in time range)  gabapentin (NEURONTIN) capsule 400 mg (has no administration in time range)  gabapentin (NEURONTIN) tablet 1,200 mg (has no administration in time range)  sodium chloride flush (NS) 0.9 % injection 3 mL (3 mLs Intravenous Given 07/01/22 0808)  iohexol (OMNIPAQUE) 350 MG/ML injection 100 mL (100 mLs Intravenous Contrast Given 07/01/22 0751)  gadobutrol (GADAVIST) 1 MMOL/ML injection 9 mL (9 mLs Intravenous Contrast Given 07/01/22 1230)  aspirin tablet 325 mg (325 mg Oral Given 07/01/22 1430)     IMPRESSION / MDM / ASSESSMENT AND PLAN / ED COURSE  I reviewed the triage vital signs and the nursing notes.                               This patient presents to the ED for concern of transient weakness and numbness, this involves an extensive number of treatment options, and is a complaint that carries with it a high risk of complications and morbidity.  The differential diagnosis includes CVA, TIA, seizure, neuropathy   Co morbidities that complicate the patient evaluation  HTN Neuropathy Recently diagnosed adenoCA of appendix CVA history    Additional history  obtained:  External records from outside source obtained and reviewed including review of oncology ontes   Lab Tests:  I Ordered, and personally interpreted labs.  The pertinent results include:   CMP unremarkable CBC without leukocytosis or anemia INR normal   Imaging Studies ordered:  I ordered imaging studies including CT head, CT Angio, MR Brain  I independently visualized and interpreted imaging which showed: CT head negative, CT angio concerning for soft plaque at R carotid bifurcation with irregularity, MRI shows embolic ischemic strokes  I agree with the radiologist interpretation   Consultations Obtained:  I requested consultation with the Dr. Rory Percy,  and discussed lab and imaging findings as well as pertinent plan - they recommend: Admission   Problem List / ED Course / Critical interventions / Medication management  Numbness Activated as code stroke, likely embolic. Does not meet tPA criteria and deficits mostly resolved thankfully. Admit to medicine. Neuro consulted.   Test / Admission - Considered:  Admit to  hospitalist   FINAL CLINICAL IMPRESSION(S) / ED DIAGNOSES   Final diagnoses:  None     Rx / DC Orders   ED Discharge Orders     None        Note:  This document was prepared using Dragon voice recognition software and may include unintentional dictation errors.   Duffy Bruce, MD 07/01/22 [id number]

## 2022-07-01 NOTE — Progress Notes (Signed)
*  PRELIMINARY RESULTS* Echocardiogram 2D Echocardiogram has been performed.  Patrick Bailey 07/01/2022, 2:29 PM

## 2022-07-01 NOTE — Assessment & Plan Note (Signed)
Per Neurology --  DAPT (aspirin 81+ Plavix 75) for 3 months followed by ASA 81 only LDL at goal without statin. No need for statin from a stroke perspective a1C pending-follow-up with outpatient primary care.  Goal A1c less than 7. PT OT ST -- no needs Outpatient cerebral angio with Dr. Estanislado Pandy in the next couple of weeks. He and his office aware - they will call patient to schedule.

## 2022-07-01 NOTE — Progress Notes (Signed)
Patient returned from ultrasound in stable condition.
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
## 2022-07-01 NOTE — Code Documentation (Signed)
Stroke Response Nurse Documentation Code Documentation  Marius Betts. is a 61 y.o. male arriving to [facility] via [facility] on 07/01/2022 with past medical hx of CAD, HTN, throat cancer-currently on chemo, previous smoker, CPDD, hypothyroidism, ETOH use. On clopidogrel 75 mg daily. Code stroke was activated by ED.   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 Patient endorses decreased sensation to left leg and face.  Telecart activated. Patient to CT with team. NIHSS 1, see documentation for details and code stroke times. Patient with left decreased sensation on exam. The following imaging was completed:  CT Head, CTA, and CTP. Patient is not a candidate for IV Thrombolytic due to too mild to treat, per MD. Patient is not a candidate for IR due to LVO not seen on imaging, per MD. Patient educated on BE FAST stroke symptoms, benefits of calling 911 in the event of acute onset of stroke symptoms, and risk factor reduction (exercise, reducing alcohol consumption.)  Care Plan: q51mn NIHSS + vital signs until patient is outside window (1030). MRI ordered.   Bedside handoff with ED RN LBerenda Morale    KCharise Carwin Stroke Response RN

## 2022-07-01 NOTE — Consult Note (Signed)
[facility] Tele[facility]urology Consult Services   Patient Name:   Maximos, Zayas Date of Birth:   [birth year] Identification Number:   MRN - [id number] Date of Service:   07/01/2022 07:25:34  Diagnosis:       R53.1 - Weakness  Impression:      Yossi Hinchman is a 61 yo M w/ a hx of active colon cancer who presents with left sided numbness and weakness. Here, exam is notable for LLE drift and sensory disturbance. Head CT showed no acute intracranial pathology. No LVO on CTA. Ddx includes ACA stroke vs lumbosacral radiculopathy. Not eligible for thrombolytics given active GI malignancy. Recommend the following:  - MRI brain w/o contrast  - MRI L spine w/o contrast  - PT/OT  - If no acute infarct, okay for discharge with outpatient Neurology follo-up  Sign Out:       Discussed with Emergency Department Provider    ------------------------------------------------------------------------------  Advanced Imaging: CTA Head and Neck Completed.  LVO:No  Patient in not a candidate for NIR   Metrics: Last Known Well: 07/01/2022 06:00:00 [facility] Notification Time: 07/01/2022 07:25:33 Arrival Time: 07/01/2022 07:11:04 Stamp Time: 07/01/2022 07:25:34 Initial Response Time: 07/01/2022 07:27:47 Symptoms: left sided numbness and weakness. Initial patient interaction: 07/01/2022 07:31:01 NIHSS Assessment Completed: 07/01/2022 07:31:56 Patient is not a candidate for Thrombolytic. Thrombolytic Medical Decision: 07/01/2022 07:34:00 Patient was not deemed candidate for Thrombolytic because of following reasons: History of G.I. malignancy not known to be in remission.  CT head showed no acute hemorrhage or acute core infarct.  Primary Provider Notified of Diagnostic Impression and Management Plan on: 07/01/2022 08:03:00    ------------------------------------------------------------------------------  History of Present Illness: Patient is a 61 year old Male.  Patient was  brought by private transportation with symptoms of left sided numbness and weakness. Beuford Garcilazo is a 61 yo M w/ a hx of active colon cancer who presents with left sided numbness and weakness. LKN at 0600. Around that time, developed b/l LE weakness and numbness, worse on the left. Here, exam is notable for LLE drift and sensory disturbance. Head CT showed no acute intracranial pathology. No LVO on CTA.   Past Medical History:      Stroke  Medications:  No Anticoagulant use  Antiplatelet use: Yes Plavix Reviewed EMR for current medications  Allergies:  Reviewed  Social History: Smoking: Former Alcohol Use: No Drug Use: No  Family History:  There is no family history of premature cerebrovascular disease pertinent to this consultation  ROS : 14 Points Review of Systems was performed and was negative except mentioned in HPI.  Past Surgical History: There Is No Surgical History Contributory To Today's Visit    Examination: BP(126/82), Pulse(71), Blood Glucose(94) 1A: Level of Consciousness - Alert; keenly responsive + 0 1B: Ask Month and Age - Both Questions Right + 0 1C: Blink Eyes & Squeeze Hands - Performs Both Tasks + 0 2: Test Horizontal Extraocular Movements - Normal + 0 3: Test Visual Fields - No Visual Loss + 0 4: Test Facial Palsy (Use Grimace if Obtunded) - Normal symmetry + 0 5A: Test Left Arm Motor Drift - No Drift for 10 Seconds + 0 5B: Test Right Arm Motor Drift - No Drift for 10 Seconds + 0 6A: Test Left Leg Motor Drift - Drift, hits bed + 2 6B: Test Right Leg Motor Drift - No Drift for 5 Seconds + 0 7: Test Limb Ataxia (FNF/Heel-Shin) - No Ataxia + 0 8: Test Sensation - Mild-Moderate Loss: Less Sharp/More  Dull + 1 9: Test Language/Aphasia - Normal; No aphasia + 0 10: Test Dysarthria - Normal + 0 11: Test Extinction/Inattention - No abnormality + 0  NIHSS Score: 3   Pre-Morbid Modified Rankin Scale: 0 Points = No symptoms at all  Spoke with : ED  Provider  Patient/Family was informed the Neurology Consult would occur via TeleHealth consult by way of interactive audio and video telecommunications and consented to receiving care in this manner.   Patient is being evaluated for possible acute neurologic impairment and high probability of imminent or life-threatening deterioration. I spent total of 27 minutes providing care to this patient, including time for face to face visit via telemedicine, review of medical records, imaging studies and discussion of findings with providers, the patient and/or family.   Dr Abbe Amsterdam   [facility] For Inpatient follow-up with [facility] physician please call [facility] [phone]. This is not an outpatient service. Post hospital discharge, please contact hospital directly.

## 2022-07-01 NOTE — ED Notes (Signed)
Patient to Ct with this RN on monitor.

## 2022-07-01 NOTE — Assessment & Plan Note (Signed)
-   Labetalol 5 mg IV every 4 hours as needed for SBP greater than 180, 4 doses ordered

## 2022-07-01 NOTE — ED Notes (Signed)
Patient back to ED room after CT.

## 2022-07-01 NOTE — ED Notes (Signed)
Informed RN bed assigned 

## 2022-07-01 NOTE — ED Notes (Signed)
Pt going to MRI

## 2022-07-01 NOTE — Assessment & Plan Note (Addendum)
-   Inpatient with cancer on chemotherapy - Bilateral leg pain, right more than the left - Bilateral lower extremity ultrasound to assess for DVT ordered

## 2022-07-01 NOTE — Assessment & Plan Note (Signed)
-   Patient drinks 6 packs of beer per day, last drink was 06/30/2022 - Patient denies history of tremors, seizures, withdrawal symptoms - Ativan 1 mg IV every 4 hours as needed for anxiety, 4 doses ordered

## 2022-07-01 NOTE — Progress Notes (Signed)
Patient arrived to unit in stable condition via wheelchair. Ambulated with steady gait from w/c to bed.

## 2022-07-01 NOTE — ED Notes (Signed)
Pt back from MRI 

## 2022-07-01 NOTE — Assessment & Plan Note (Signed)
-   Lorazepam 0.5 mg IV every 6 hours as needed for anxiety, 3 doses ordered 

## 2022-07-01 NOTE — Progress Notes (Signed)
Pt was taken to [facility]

## 2022-07-01 NOTE — Progress Notes (Signed)
MRI of the brain with right hemispheric punctate looking strokes in watershed distribution.  CT angiography of the head and neck with right ICA 50% stenosis with possible ulcerated plaque. For now, continue DAPT and high intensity statin Complete full stroke workup including 2D echo, A1c, lipid panel. Frequent neurochecks I have spoken with the neurointerventional radiologist-he will require diagnostic cerebral angiogram and revascularization in the coming week or 2.  Neuro IR office will call the patient after discharge to schedule that outpatient.  I will follow the patient with you after the workup is completed tomorrow.   -- Amie Portland, MD Neurologist [facility] Pager: [phone]

## 2022-07-01 NOTE — Assessment & Plan Note (Signed)
-   Nicorette gum as needed for nicotine craving/smoking cessation

## 2022-07-01 NOTE — Assessment & Plan Note (Signed)
-   Atorvastatin 80 mg nightly ordered

## 2022-07-01 NOTE — ED Notes (Addendum)
Pt states left sided numbness and weakness that started at 0630am. Pt states hx of stroke. Pt woke up at 4am and was normal.   Pt states little blurry vision. Pt does states chemo treatment week half go. Pt states colon cancer. Pt is on thinners as well.   Pt taken to CT scan

## 2022-07-01 NOTE — Hospital Course (Addendum)
[name] is a 61 year old male with history of stroke, hypothyroid, neuropathy, hyperlipidemia, hypertension, depression, anxiety, history of stage IIIb mucinous appendiceal adenocarcinoma, who presents emergency department for chief concerns of left-sided numbness and weakness that started at approximately 6:30 AM on day of admission.  Initial vitals in the emergency department showed temperature of 98.1, respiration rate of 18, heart rate of 71, blood pressure 116/79, SpO2 of 93% on room air.  Serum sodium is 137, potassium 4.2, chloride 101, bicarb 26, BUN of 15, serum creatinine of 0.89, EGFR greater than 60, nonfasting blood glucose 102, WBC 7.0, hemoglobin 14.3, platelets of 209.  CT of the head without contrast was read as stable head CT.  No hemorrhage or visible infarct.  MRI of the brain with and without contrast: Was read as multiple small punctate acute infarcts in the right MCA territory.  CTA head and neck with and without contrast material: Aortic atherosclerosis.  Extensive soft plaque on the right carotid bifurcation and ICA bulb with a loop irregularity.  50% stenosis.  Soft plaque ulceration.  At the distal bulb there appears some trailing thrombus that could be a source of embolic disease.  40% stenosis of the proximal ICA bulb on the left.  30% stenosis of the left vertebral artery origin.  ED treatment: None

## 2022-07-01 NOTE — Assessment & Plan Note (Signed)
-   Lorazepam 0.5 mg IV every 6 hours.  For anxiety, 3 doses ordered

## 2022-07-02 DIAGNOSIS — I63511 Cerebral infarction due to unspecified occlusion or stenosis of right middle cerebral artery: Secondary | ICD-10-CM | POA: Diagnosis not present

## 2022-07-02 LAB — LIPID PANEL
Cholesterol: 127 mg/dL (ref 0–200)
HDL: 37 mg/dL — ABNORMAL LOW (ref >40)
LDL Cholesterol: 30 mg/dL (ref 0–99)
Total CHOL/HDL Ratio: 3.4 RATIO
Triglycerides: 302 mg/dL — ABNORMAL HIGH (ref <150)
VLDL: 60 mg/dL — ABNORMAL HIGH (ref 0–40)

## 2022-07-02 LAB — HEMOGLOBIN A1C
Hgb A1c MFr Bld: 5.4 % (ref 4.8–5.6)
Mean Plasma Glucose: 108 mg/dL

## 2022-07-02 MED ORDER — ASPIRIN 81 MG PO TBEC: 81.0000 mg | DELAYED_RELEASE_TABLET | Freq: Every day | ORAL | 2 refills | Status: DC

## 2022-07-02 NOTE — Discharge Summary (Signed)
Physician Discharge Summary   Patient: Patrick Bailey. MRN: [id number] DOB: 11-19-1960  Admit date:     07/01/2022  Discharge date: 07/02/22  Discharge Physician: Ezekiel Slocumb   PCP: Ria Bush, MD   Recommendations at discharge:   Follow up with neuro interventional radiology Follow up with neurology Follow up with primary care  Discharge Diagnoses: Principal Problem:   Acute ischemic right MCA stroke ([place]) Active Problems:   Alcohol use   Leg pain   Anxiety state   Chronic insomnia   TOBACCO ABUSE, HX OF   Essential hypertension   Hypothyroidism   Hypertriglyceridemia   Primary osteoarthritis of right knee   BPH (benign prostatic hyperplasia)   GERD (gastroesophageal reflux disease)  Resolved Problems:   * No resolved hospital problems. [facility] Course: Patrick Bailey is a 61 year old male with history of stroke, hypothyroid, neuropathy, hyperlipidemia, hypertension, depression, anxiety, history of stage IIIb mucinous appendiceal adenocarcinoma, who presents emergency department for chief concerns of left-sided numbness and weakness that started at approximately 6:30 AM on day of admission.  Initial vitals in the emergency department showed temperature of 98.1, respiration rate of 18, heart rate of 71, blood pressure 116/79, SpO2 of 93% on room air.  Serum sodium is 137, potassium 4.2, chloride 101, bicarb 26, BUN of 15, serum creatinine of 0.89, EGFR greater than 60, nonfasting blood glucose 102, WBC 7.0, hemoglobin 14.3, platelets of 209.  CT of the head without contrast was read as stable head CT.  No hemorrhage or visible infarct.  MRI of the brain with and without contrast: Was read as multiple small punctate acute infarcts in the right MCA territory.  CTA head and neck with and without contrast material: Aortic atherosclerosis.  Extensive soft plaque on the right carotid bifurcation and ICA bulb with a loop irregularity.  50% stenosis.  Soft  plaque ulceration.  At the distal bulb there appears some trailing thrombus that could be a source of embolic disease.  40% stenosis of the proximal ICA bulb on the left.  30% stenosis of the left vertebral artery origin.  ED treatment: None   11/30 --- patient seen by Neurology, recommended continuing DAPT with ASA/Plavix and close follow up with outpatient Neuro interventional radiology to address right carotid stenosis.  No therapy needs identified.  Patient is medically stable for discharge today and agreeable.   Assessment and Plan: * Acute ischemic right MCA stroke [facility]) Per Neurology --  DAPT (aspirin 81+ Plavix 75) for 3 months followed by ASA 81 only LDL at goal without statin. No need for statin from a stroke perspective a1C pending-follow-up with outpatient primary care.  Goal A1c less than 7. PT OT ST -- no needs Outpatient cerebral angio with Dr. Estanislado Pandy in the next couple of weeks. He and his office aware - they will call patient to schedule.  Leg pain - Inpatient with cancer on chemotherapy - Bilateral leg pain, right more than the left - Bilateral lower extremity ultrasound to assess for DVT ordered  Alcohol use - Patient drinks 6 packs of beer per day, last drink was 06/30/2022 - Patient denies history of tremors, seizures, withdrawal symptoms - Ativan 1 mg IV every 4 hours as needed for anxiety, 4 doses ordered  Hypertriglyceridemia - Atorvastatin 80 mg nightly ordered  Essential hypertension - Labetalol 5 mg IV every 4 hours as needed for SBP greater than 180, 4 doses ordered  TOBACCO ABUSE, HX OF - Nicorette gum as needed  for nicotine craving/smoking cessation  Chronic insomnia - Lorazepam 0.5 mg IV every 6 hours as needed for anxiety, 3 doses ordered  Anxiety state - Lorazepam 0.5 mg IV every 6 hours.  For anxiety, 3 doses ordered         Consultants: Neurology Procedures performed: Echo normal EF, grade 1 diastolic dysfunction  Disposition:  Home Diet recommendation:  Discharge Diet Orders (From admission, onward)     Start     Ordered   07/02/22 0000  Diet - low sodium heart healthy        07/02/22 0921           Cardiac diet DISCHARGE MEDICATION: Allergies as of 07/02/2022       Reactions   Hydrocodone Hives        Medication List     STOP taking these medications    oxyCODONE-acetaminophen 5-325 MG tablet Commonly known as: PERCOCET/ROXICET       TAKE these medications    amitriptyline 50 MG tablet Commonly known as: ELAVIL Take 1.5 tablets (75 mg total) by mouth at bedtime.   amLODipine 10 MG tablet Commonly known as: NORVASC Take 1 tablet by mouth once daily   aspirin EC 81 MG tablet Take 1 tablet (81 mg total) by mouth daily. Swallow whole. Start taking on: July 03, 2022   atorvastatin 40 MG tablet Commonly known as: LIPITOR Take 1 tablet by mouth once daily   capecitabine 500 MG tablet Commonly known as: XELODA Take 1,500 mg by mouth 2 (two) times daily. 2 wks on, 1 wk off   clopidogrel 75 MG tablet Commonly known as: PLAVIX Take 1 tablet by mouth once daily   DentaGel 1.1 % Gel dental gel Generic drug: sodium fluoride PLACE 1 APPLICATION ONTO TEETH AT BEDTIME.   famotidine 20 MG tablet Commonly known as: Pepcid Take 1 tablet (20 mg total) by mouth 2 (two) times daily.   fenofibrate 160 MG tablet Take 1 tablet by mouth once daily   Fish Oil 1000 MG Caps Take 2 capsules (2,000 mg total) by mouth 2 (two) times a day. What changed:  how much to take when to take this   gabapentin 400 MG capsule Commonly known as: NEURONTIN Take 1 capsule (400 mg total) by mouth 2 (two) times daily AND 3 capsules (1,200 mg total) at bedtime.   levothyroxine 150 MCG tablet Commonly known as: SYNTHROID Take 1 tablet by mouth once daily What changed: when to take this   metoprolol tartrate 100 MG tablet Commonly known as: LOPRESSOR Take 1 tablet (100 mg total) by mouth 2 (two)  times daily.   Mitigare 0.6 MG Caps Generic drug: Colchicine Take 0.6 mg by mouth daily as needed (gout flare). TAKE 1 CAPSULE BY MOUTH IN THE MORNING AS NEEDED What changed: additional instructions        Discharge Exam: Filed Weights   07/01/22 1403  Weight: 89.4 kg   General exam: awake, alert, no acute distress HEENT: moist mucus membranes, hearing grossly normal  Respiratory system: CTAB, no wheezes, rales or rhonchi, normal respiratory effort. Cardiovascular system: normal S1/S2, RRR, no JVD, murmurs, rubs, gallops, no pedal edema.   Gastrointestinal system: soft, NT, ND, no HSM felt, +bowel sounds. Central nervous system: A&O x4. no gross focal neurologic deficits, normal speech Extremities: moves all, no edema, normal tone Skin: dry, intact, normal temperature, normal color, No rashes, lesions or ulcers Psychiatry: normal mood, congruent affect, judgement and insight appear normal   Condition at  discharge: stable  The results of significant diagnostics from this hospitalization (including imaging, microbiology, ancillary and laboratory) are listed below for reference.   Imaging Studies: ECHOCARDIOGRAM COMPLETE  Result Date: 07/01/2022    ECHOCARDIOGRAM REPORT   Patient Name:   Ori Trejos. Date of Exam: 07/01/2022 Medical Rec #:  [id number]           Height:       72.0 in Accession #:    [id number]          Weight:       191.1 lb Date of Birth:  1960-12-17           BSA:          2.090 m Patient Age:    75 years            BP:           137/91 mmHg Patient Gender: M                   HR:           78 bpm. Exam Location:  [facility] Procedure: 2D Echo, Color Doppler and Cardiac Doppler Indications:     Stroke I63.9  History:         Patient has prior history of Echocardiogram examinations, most                  recent 01/12/2020. Risk Factors:Hypertension.  Sonographer:     Sherrie Sport Referring Phys:  [id number] AMY N COX Diagnosing Phys: Kate Sable MD  Sonographer  Comments: Image quality was good. IMPRESSIONS  1. Left ventricular ejection fraction, by estimation, is 55 to 60%. The left ventricle has normal function. The left ventricle has no regional wall motion abnormalities. Left ventricular diastolic parameters are consistent with Grade I diastolic dysfunction (impaired relaxation).  2. Right ventricular systolic function is normal. The right ventricular size is normal.  3. The mitral valve is normal in structure. Trivial mitral valve regurgitation.  4. The aortic valve is tricuspid. Aortic valve regurgitation is mild.  5. Aortic dilatation noted. There is mild dilatation of the aortic root, measuring 40 mm.  6. The inferior vena cava is normal in size with greater than 50% respiratory variability, suggesting right atrial pressure of 3 mmHg. FINDINGS  Left Ventricle: Left ventricular ejection fraction, by estimation, is 55 to 60%. The left ventricle has normal function. The left ventricle has no regional wall motion abnormalities. The left ventricular internal cavity size was normal in size. There is  no left ventricular hypertrophy. Left ventricular diastolic parameters are consistent with Grade I diastolic dysfunction (impaired relaxation). Right Ventricle: The right ventricular size is normal. No increase in right ventricular wall thickness. Right ventricular systolic function is normal. Left Atrium: Left atrial size was normal in size. Right Atrium: Right atrial size was normal in size. Pericardium: There is no evidence of pericardial effusion. Mitral Valve: The mitral valve is normal in structure. Trivial mitral valve regurgitation. Tricuspid Valve: The tricuspid valve is normal in structure. Tricuspid valve regurgitation is not demonstrated. Aortic Valve: The aortic valve is tricuspid. Aortic valve regurgitation is mild. Aortic valve mean gradient measures 3.0 mmHg. Aortic valve peak gradient measures 4.6 mmHg. Aortic valve area, by VTI measures 4.57 cm. Pulmonic  Valve: The pulmonic valve was normal in structure. Pulmonic valve regurgitation is not visualized. Aorta: Aortic dilatation noted. There is mild dilatation of the aortic root, measuring 40 mm. Venous: The inferior vena cava  is normal in size with greater than 50% respiratory variability, suggesting right atrial pressure of 3 mmHg. IAS/Shunts: No atrial level shunt detected by color flow Doppler.  LEFT VENTRICLE PLAX 2D LVIDd:         4.80 cm   Diastology LVIDs:         3.50 cm   LV e' medial:    6.85 cm/s LV PW:         1.00 cm   LV E/e' medial:  9.2 LV IVS:        1.00 cm   LV e' lateral:   9.79 cm/s LVOT diam:     2.30 cm   LV E/e' lateral: 6.4 LV SV:         87 LV SV Index:   42 LVOT Area:     4.15 cm  RIGHT VENTRICLE RV Basal diam:  2.70 cm RV Mid diam:    2.30 cm RV S prime:     10.40 cm/s TAPSE (M-mode): 1.4 cm LEFT ATRIUM             Index        RIGHT ATRIUM           Index LA diam:        3.40 cm 1.63 cm/m   RA Area:     11.80 cm LA Vol (A2C):   26.9 ml 12.87 ml/m  RA Volume:   23.00 ml  11.01 ml/m LA Vol (A4C):   17.9 ml 8.57 ml/m LA Biplane Vol: 21.7 ml 10.38 ml/m  AORTIC VALVE AV Area (Vmax):    4.08 cm AV Area (Vmean):   3.57 cm AV Area (VTI):     4.57 cm AV Vmax:           107.00 cm/s AV Vmean:          80.700 cm/s AV VTI:            0.191 m AV Peak Grad:      4.6 mmHg AV Mean Grad:      3.0 mmHg LVOT Vmax:         105.00 cm/s LVOT Vmean:        69.400 cm/s LVOT VTI:          0.210 m LVOT/AV VTI ratio: 1.10  AORTA Ao Root diam: 3.86 cm MITRAL VALVE               TRICUSPID VALVE MV Area (PHT): 3.76 cm    TR Peak grad:   9.7 mmHg MV Decel Time: 202 msec    TR Vmax:        156.00 cm/s MV E velocity: 62.90 cm/s MV A velocity: 93.30 cm/s  SHUNTS MV E/A ratio:  0.67        Systemic VTI:  0.21 m                            Systemic Diam: 2.30 cm Kate Sable MD Electronically signed by Kate Sable MD Signature Date/Time: 07/01/2022/6:11:18 PM    Final    US Venous Img Lower Bilateral  (DVT)  Result Date: 07/01/2022 CLINICAL DATA:  Bilateral leg pain EXAM: BILATERAL LOWER EXTREMITY VENOUS DOPPLER ULTRASOUND TECHNIQUE: Gray-scale sonography with compression, as well as color and duplex ultrasound, were performed to evaluate the deep venous system(s) from the level of the common femoral vein through the popliteal and proximal calf veins. COMPARISON:  None Available. FINDINGS:  VENOUS Normal compressibility of the common femoral, superficial femoral, and popliteal veins, as well as the visualized calf veins. Visualized portions of profunda femoral vein and great saphenous vein unremarkable. No filling defects to suggest DVT on grayscale or color Doppler imaging. Doppler waveforms show normal direction of venous flow, normal respiratory plasticity and response to augmentation. OTHER Calcified density within the right popliteal fossa measuring up to 21 mm in greatest dimension may represent postsurgical change or an intracystic calcification within the a collapsed Baker's cyst, previously noted on plain radiograph of 12/08/2021., Limitations: none IMPRESSION: 1. No femoropopliteal DVT within the lower extremities. Electronically Signed   By: Fidela Salisbury M.D.   On: 07/01/2022 17:23   MR Brain W and Wo Contrast  Result Date: 07/01/2022 CLINICAL DATA:  Neuro deficit.  Left-sided weakness. EXAM: MRI HEAD WITHOUT AND WITH CONTRAST TECHNIQUE: Multiplanar, multiecho pulse sequences of the brain and surrounding structures were obtained without and with intravenous contrast. CONTRAST:  64m GADAVIST GADOBUTROL 1 MMOL/ML IV SOLN COMPARISON:  Same day CT Head and CT head/neck angiogram FINDINGS: Brain: There are multiple small punctate foci of acute infarcts in the right MCA territory involving the right frontal lobe, right parietal lobe, and the corona radiata. Given findings on same day CTA head and neck angiogram, these are favored to be embolic. Sequela of mild chronic microvascular ischemic change.  No hemorrhage. No extra-axial fluid collection. No contrast-enhancing lesion is visualized. Vascular: Normal flow voids. Skull and upper cervical spine: Normal marrow signal. Sinuses/Orbits: Normal-appearing orbits. Mild mucosal thickening bilateral sphenoid, ethmoid, and maxillary sinuses. Other: None. IMPRESSION: Multiple small punctate acute infarcts in the right MCA territory. Given findings on same day CTA head and neck angiogram, these are favored to be embolic. No hemorrhage. Electronically Signed   By: HMarin RobertsM.D.   On: 07/01/2022 12:42   CT ANGIO HEAD NECK W WO CM W PERF (CODE STROKE)  Result Date: 07/01/2022 CLINICAL DATA:  Neuro deficit, acute, stroke suspected. EXAM: CT ANGIOGRAPHY HEAD AND NECK CT PERFUSION BRAIN TECHNIQUE: Multidetector CT imaging of the head and neck was performed using the standard protocol during bolus administration of intravenous contrast. Multiplanar CT image reconstructions and MIPs were obtained to evaluate the vascular anatomy. Carotid stenosis measurements (when applicable) are obtained utilizing NASCET criteria, using the distal internal carotid diameter as the denominator. Multiphase CT imaging of the brain was performed following IV bolus contrast injection. Subsequent parametric perfusion maps were calculated using RAPID software. RADIATION DOSE REDUCTION: This exam was performed according to the departmental dose-optimization program which includes automated exposure control, adjustment of the mA and/or kV according to patient size and/or use of iterative reconstruction technique. CONTRAST:  1055mOMNIPAQUE IOHEXOL 350 MG/ML SOLN COMPARISON:  Head CT earlier same day. Previous CT angiography 07/11/2016. FINDINGS: CTA NECK FINDINGS Aortic arch: Aortic atherosclerosis. Branching pattern is normal without origin stenosis. Right carotid system: Common carotid artery shows scattered plaque but is widely patent to the bifurcation region. Extensive soft plaque at  the bifurcation and ICA bulb with narrowing and irregularity. There could be some soft plaque ulceration. At the distal bulb, there could be some trailing thrombus that could be a source of embolic disease. Minimal diameter in the region is 2.5 mm. Compared to a more distal cervical ICA diameter of 5 mm, this indicates a 50% stenosis. Left carotid system: Common carotid artery shows scattered plaque but no significant stenosis. There is calcified plaque at the carotid bifurcation and ICA bulb. Minimal diameter is 3  mm. Compared to a more distal cervical ICA diameter of 5 mm, this indicates a 40% stenosis. Vertebral arteries: Left vertebral artery is dominant. 30% stenosis at the left vertebral artery origin. Three right vertebral artery is a tiny vessel with moderate stenosis at its origin. Beyond that, both vertebral arteries are patent through the cervical region, but no flow is seen in the small distal right vertebral artery beyond the level of C1. distal right vertebral artery occlusion was also seen in 2017. Skeleton: Chronic degenerative spondylosis. No acute finding. Other neck: Previous treatment changes of the right neck for prior head and neck cancer as described in the history. No sign mass lesion or adenopathy. Upper chest: Mild scarring and emphysematous change at the lung apices. Review of the MIP images confirms the above findings CTA HEAD FINDINGS Anterior circulation: Both internal carotid arteries are patent through the skull base and siphon regions. There is siphon atherosclerosis with stenosis estimated at 50% on both sides. No large vessel occlusion or flow limiting proximal stenosis. Posterior circulation: Dominant left vertebral artery widely patent through the foramen magnum to the basilar artery. No basilar stenosis. Superior cerebellar and posterior cerebral arteries are patent. 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 There is retrograde flow in the  distal right vertebral artery. Venous sinuses: Patent and normal. Anatomic variants: None other significant. Review of the MIP images confirms the above findings CT Brain Perfusion Findings: ASPECTS: 10 CBF (<30%) Volume: 75m Perfusion (Tmax>6.0s) volume: 0453mMismatch Volume: 53m71mnfarction Location:None IMPRESSION: 1. Negative CT perfusion study. 2. No intracranial large vessel occlusion or flow limiting proximal stenosis. 3. Aortic atherosclerosis. 4. Extensive soft plaque at the right carotid bifurcation and ICA bulb with irregularity. 50% stenosis. There could be some soft plaque ulceration. At the distal bulb, there appears to be some trailing thrombus that could be a source of embolic disease. 5. 40% stenosis of the proximal ICA bulb on the left. 6. 30% stenosis at the left vertebral artery origin. Moderate stenosis at the right vertebral artery origin. Beyond that, both vertebral arteries are patent through the cervical region, but no flow is seen in the small distal right vertebral artery beyond the level of C1. This chronic distal right vertebral artery occlusion was seen in 2017 as well. There is retrograde flow in the distal right vertebral artery. 7. Previous treatment changes of the right neck for prior head and neck cancer as described in the history. 8. These results were called by telephone at the time of interpretation on 07/01/2022 at 8:27 am to provider Dr. IsaEllender Hoseho verbally acknowledged these results. Aortic Atherosclerosis (ICD10-I70.0). Electronically Signed   By: MarNelson ChimesD.   On: 07/01/2022 08:29   CT HEAD CODE STROKE WO CONTRAST  Result Date: 07/01/2022 CLINICAL DATA:  Code stroke.  Left-sided weakness and numbness. EXAM: CT HEAD WITHOUT CONTRAST TECHNIQUE: Contiguous axial images were obtained from the base of the skull through the vertex without intravenous contrast. RADIATION DOSE REDUCTION: This exam was performed according to the departmental dose-optimization program  which includes automated exposure control, adjustment of the mA and/or kV according to patient size and/or use of iterative reconstruction technique. COMPARISON:  Head CT 01/11/2020 FINDINGS: Brain: No evidence of acute infarction, hemorrhage, hydrocephalus, extra-axial collection or mass lesion/mass effect. Vascular: No hyperdense vessel or unexpected calcification. Skull: Normal. Negative for fracture or focal lesion. Sinuses/Orbits: No acute finding. Other: Prelim sent in epic chat with confirmation. ASPECTS ([facility] Program Early  CT Score) - Ganglionic level infarction (caudate, lentiform nuclei, internal capsule, insula, M1-M3 cortex): 7 - Supraganglionic infarction (M4-M6 cortex): 3 Total score (0-10 with 10 being normal): 10 IMPRESSION: 1. Stable head CT.  No hemorrhage or visible infarct. 2. ASPECTS is 10. Electronically Signed   By: Jorje Guild M.D.   On: 07/01/2022 07:25    Microbiology: Results for orders placed or performed during the hospital encounter of 11/14/21  Resp Panel by RT-PCR (Flu A&B, Covid) Nasopharyngeal Swab     Status: None   Collection Time: 11/14/21 12:34 AM   Specimen: Nasopharyngeal Swab; Nasopharyngeal(NP) swabs in vial transport medium  Result Value Ref Range Status   SARS Coronavirus 2 by RT PCR NEGATIVE NEGATIVE Final    Comment: (NOTE) SARS-CoV-2 target nucleic acids are NOT DETECTED.  The SARS-CoV-2 RNA is generally detectable in upper respiratory specimens during the acute phase of infection. The lowest concentration of SARS-CoV-2 viral copies this assay can detect is 138 copies/mL. A negative result does not preclude SARS-Cov-2 infection and should not be used as the sole basis for treatment or other patient management decisions. A negative result may occur with  improper specimen collection/handling, submission of specimen other than nasopharyngeal swab, presence of viral mutation(s) within the areas targeted by this assay, and inadequate number  of viral copies(<138 copies/mL). A negative result must be combined with clinical observations, patient history, and epidemiological information. The expected result is Negative.  Fact Sheet for Patients:  EntrepreneurPulse.com.au  Fact Sheet for Healthcare Providers:  IncredibleEmployment.be  This test is no t yet approved or cleared by the [place] FDA and  has been authorized for detection and/or diagnosis of SARS-CoV-2 by FDA under an Emergency Use Authorization (EUA). This EUA will remain  in effect (meaning this test can be used) for the duration of the COVID-19 declaration under Section 564(b)(1) of the Act, 21 U.S.C.section 360bbb-3(b)(1), unless the authorization is terminated  or revoked sooner.       Influenza A by PCR NEGATIVE NEGATIVE Final   Influenza B by PCR NEGATIVE NEGATIVE Final    Comment: (NOTE) The Xpert Xpress SARS-CoV-2/FLU/RSV plus assay is intended as an aid in the diagnosis of influenza from Nasopharyngeal swab specimens and should not be used as a sole basis for treatment. Nasal washings and aspirates are unacceptable for Xpert Xpress SARS-CoV-2/FLU/RSV testing.  Fact Sheet for Patients: EntrepreneurPulse.com.au  Fact Sheet for Healthcare Providers: IncredibleEmployment.be  This test is not yet approved or cleared by the [place] FDA and has been authorized for detection and/or diagnosis of SARS-CoV-2 by FDA under an Emergency Use Authorization (EUA). This EUA will remain in effect (meaning this test can be used) for the duration of the COVID-19 declaration under Section 564(b)(1) of the Act, 21 U.S.C. section 360bbb-3(b)(1), unless the authorization is terminated or revoked.  Performed at [facility], [place] [address], [place], [place] [address]     Labs: CBC: Recent Labs  Lab 07/01/22 0805  WBC 7.0  NEUTROABS 4.4  HGB 14.3  HCT 41.2  MCV 94.9   PLT 355   Basic Metabolic Panel: Recent Labs  Lab 07/01/22 0805  NA 137  K 4.2  CL 101  CO2 26  GLUCOSE 102*  BUN 15  CREATININE 0.89  CALCIUM 9.7   Liver Function Tests: Recent Labs  Lab 07/01/22 0805  AST 25  ALT 18  ALKPHOS 57  BILITOT 0.9  PROT 7.9  ALBUMIN 4.5   CBG: Recent Labs  Lab  07/01/22 0715  GLUCAP 94    Discharge time spent: less than 30 minutes.  Signed: Ezekiel Slocumb, DO [facility] 07/02/2022

## 2022-07-02 NOTE — Plan of Care (Signed)

## 2022-07-02 NOTE — Progress Notes (Signed)
Pt discharging home. PIV and tele removed. Discharge packet reviewed with patient by Estill Bamberg, RN. Pt verbalizes understanding.

## 2022-07-02 NOTE — Plan of Care (Signed)
Problem: Education: Goal: Knowledge of disease or condition will improve 07/02/2022 0927 by Mancel Bale, RN Outcome: Adequate for Discharge 07/02/2022 0747 by Mancel Bale, RN Outcome: Progressing Goal: Knowledge of secondary prevention will improve (MUST DOCUMENT ALL) 07/02/2022 0927 by Mancel Bale, RN Outcome: Adequate for Discharge 07/02/2022 0747 by Mancel Bale, RN Outcome: Progressing Goal: Knowledge of patient specific risk factors will improve Elta [place] N/A or DELETE if not current risk factor) 07/02/2022 0927 by Mancel Bale, RN Outcome: Adequate for Discharge 07/02/2022 0747 by Mancel Bale, RN Outcome: Progressing   Problem: Ischemic Stroke/TIA Tissue Perfusion: Goal: Complications of ischemic stroke/TIA will be minimized 07/02/2022 0927 by Mancel Bale, RN Outcome: Adequate for Discharge 07/02/2022 0747 by Mancel Bale, RN Outcome: Progressing   Problem: Coping: Goal: Will verbalize positive feelings about self 07/02/2022 0927 by Mancel Bale, RN Outcome: Adequate for Discharge 07/02/2022 0747 by Mancel Bale, RN Outcome: Progressing Goal: Will identify appropriate support needs 07/02/2022 0927 by Mancel Bale, RN Outcome: Adequate for Discharge 07/02/2022 0747 by Mancel Bale, RN Outcome: Progressing   Problem: Health Behavior/Discharge Planning: Goal: Ability to manage health-related needs will improve 07/02/2022 0927 by Mancel Bale, RN Outcome: Adequate for Discharge 07/02/2022 0747 by Mancel Bale, RN Outcome: Progressing Goal: Goals will be collaboratively established with patient/family 07/02/2022 0927 by Mancel Bale, RN Outcome: Adequate for Discharge 07/02/2022 0747 by Mancel Bale, RN Outcome: Progressing   Problem: Self-Care: Goal: Ability to participate in self-care as condition permits will improve 07/02/2022 0927 by Mancel Bale, RN Outcome: Adequate for  Discharge 07/02/2022 0747 by Mancel Bale, RN Outcome: Progressing Goal: Verbalization of feelings and concerns over difficulty with self-care will improve 07/02/2022 0927 by Mancel Bale, RN Outcome: Adequate for Discharge 07/02/2022 0747 by Mancel Bale, RN Outcome: Progressing Goal: Ability to communicate needs accurately will improve 07/02/2022 0927 by Mancel Bale, RN Outcome: Adequate for Discharge 07/02/2022 0747 by Mancel Bale, RN Outcome: Progressing   Problem: Nutrition: Goal: Risk of aspiration will decrease 07/02/2022 0927 by Mancel Bale, RN Outcome: Adequate for Discharge 07/02/2022 0747 by Mancel Bale, RN Outcome: Progressing Goal: Dietary intake will improve 07/02/2022 0927 by Mancel Bale, RN Outcome: Adequate for Discharge 07/02/2022 0747 by Mancel Bale, RN Outcome: Progressing   Problem: Education: Goal: Knowledge of General Education information will improve Description: Including pain rating scale, medication(s)/side effects and non-pharmacologic comfort measures 07/02/2022 0927 by Mancel Bale, RN Outcome: Adequate for Discharge 07/02/2022 0747 by Mancel Bale, RN Outcome: Progressing   Problem: Health Behavior/Discharge Planning: Goal: Ability to manage health-related needs will improve 07/02/2022 0927 by Mancel Bale, RN Outcome: Adequate for Discharge 07/02/2022 0747 by Mancel Bale, RN Outcome: Progressing   Problem: Clinical Measurements: Goal: Ability to maintain clinical measurements within normal limits will improve 07/02/2022 0927 by Mancel Bale, RN Outcome: Adequate for Discharge 07/02/2022 0747 by Mancel Bale, RN Outcome: Progressing Goal: Will remain free from infection 07/02/2022 0927 by Mancel Bale, RN Outcome: Adequate for Discharge 07/02/2022 0747 by Mancel Bale, RN Outcome: Progressing Goal: Diagnostic test results will improve 07/02/2022 0927 by  Mancel Bale, RN Outcome: Adequate for Discharge 07/02/2022 0747 by Mancel Bale, RN Outcome: Progressing Goal: Respiratory complications will improve 07/02/2022 0927 by Mancel Bale, RN Outcome: Adequate for Discharge 07/02/2022 0747 by Mancel Bale, RN Outcome: Progressing Goal: Cardiovascular complication will be avoided  07/02/2022 0927 by Mancel Bale, RN Outcome: Adequate for Discharge 07/02/2022 0747 by Mancel Bale, RN Outcome: Progressing   Problem: Activity: Goal: Risk for activity intolerance will decrease 07/02/2022 0927 by Mancel Bale, RN Outcome: Adequate for Discharge 07/02/2022 0747 by Mancel Bale, RN Outcome: Progressing   Problem: Nutrition: Goal: Adequate nutrition will be maintained 07/02/2022 0927 by Mancel Bale, RN Outcome: Adequate for Discharge 07/02/2022 0747 by Mancel Bale, RN Outcome: Progressing   Problem: Coping: Goal: Level of anxiety will decrease 07/02/2022 0927 by Mancel Bale, RN Outcome: Adequate for Discharge 07/02/2022 0747 by Mancel Bale, RN Outcome: Progressing   Problem: Elimination: Goal: Will not experience complications related to bowel motility 07/02/2022 0927 by Mancel Bale, RN Outcome: Adequate for Discharge 07/02/2022 0747 by Mancel Bale, RN Outcome: Progressing Goal: Will not experience complications related to urinary retention 07/02/2022 0927 by Mancel Bale, RN Outcome: Adequate for Discharge 07/02/2022 0747 by Mancel Bale, RN Outcome: Progressing   Problem: Pain Managment: Goal: General experience of comfort will improve 07/02/2022 0927 by Mancel Bale, RN Outcome: Adequate for Discharge 07/02/2022 0747 by Mancel Bale, RN Outcome: Progressing   Problem: Safety: Goal: Ability to remain free from injury will improve 07/02/2022 0927 by Mancel Bale, RN Outcome: Adequate for Discharge 07/02/2022 0747 by Mancel Bale,  RN Outcome: Progressing   Problem: Skin Integrity: Goal: Risk for impaired skin integrity will decrease 07/02/2022 0927 by Mancel Bale, RN Outcome: Adequate for Discharge 07/02/2022 0747 by Mancel Bale, RN Outcome: Progressing

## 2022-07-02 NOTE — Progress Notes (Signed)
Neurology Progress Note   S:// Seen and examined No acute changes Left arm feels better. Still some difficulty with controlling left leg.   O:// Current vital signs: BP (!) 138/99 (BP Location: Left Arm)   Pulse 96   Temp 97.6 F (36.4 C)   Resp 18   Ht 6' (1.829 m)   Wt 89.4 kg   SpO2 98%   BMI 26.72 kg/m  Vital signs in last 24 hours: Temp:  [97.6 F (36.4 C)-98 F (36.7 C)] 97.6 F (36.4 C) (11/30 0753) Pulse Rate:  [66-96] 96 (11/30 0753) Resp:  [12-37] 18 (11/30 0753) BP: (113-145)/(72-99) 138/99 (11/30 0753) SpO2:  [94 %-99 %] 98 % (11/30 0753) Weight:  [89.4 kg] 89.4 kg (11/29 1403) GENERAL: Awake, alert in NAD HEENT: - Normocephalic and atraumatic, dry mm, no LN++, no Thyromegally LUNGS - Clear to auscultation bilaterally with no wheezes CV - S1S2 RRR, no m/r/g, equal pulses bilaterally. ABDOMEN - Soft, nontender, nondistended with normoactive BS Ext: warm, well perfused, intact peripheral pulses, no  edema NEURO:  AAOx3 No dysarthria or aphasia CN 2-12  intact Motor with mild left LE drift. Otherwise full strength. Sensation intact Coord: no dysmetria Gait normal NIHSS -1  Medications  Current Facility-Administered Medications:     stroke: early stages of recovery book, , Does not apply, Once, Cox, Amy N, DO   acetaminophen (TYLENOL) tablet 650 mg, 650 mg, Oral, Q4H PRN **OR** acetaminophen (TYLENOL) 160 MG/5ML solution 650 mg, 650 mg, Per Tube, Q4H PRN **OR** acetaminophen (TYLENOL) suppository 650 mg, 650 mg, Rectal, Q4H PRN, Cox, Amy N, DO   amitriptyline (ELAVIL) tablet 75 mg, 75 mg, Oral, QHS, Cox, Amy N, DO, 75 mg at 07/01/22 2126   aspirin EC tablet 81 mg, 81 mg, Oral, Daily, Cox, Amy N, DO, 81 mg at 07/02/22 0807   atorvastatin (LIPITOR) tablet 80 mg, 80 mg, Oral, QHS, Cox, Amy N, DO, 80 mg at 07/01/22 2126   clopidogrel (PLAVIX) tablet 75 mg, 75 mg, Oral, Daily, Cox, Amy N, DO, 75 mg at 07/02/22 0807   enoxaparin (LOVENOX) injection 40 mg, 40  mg, Subcutaneous, Q24H, Cox, Amy N, DO, 40 mg at 07/01/22 2128   famotidine (PEPCID) tablet 20 mg, 20 mg, Oral, BID, Cox, Amy N, DO, 20 mg at 07/02/22 1884   fenofibrate tablet 160 mg, 160 mg, Oral, Daily, Cox, Amy N, DO   gabapentin (NEURONTIN) capsule 400 mg, 400 mg, Oral, BID, Cox, Amy N, DO, 400 mg at 07/02/22 1660   gabapentin (NEURONTIN) tablet 1,200 mg, 1,200 mg, Oral, QHS, Noralee Space, RPH, 1,200 mg at 07/01/22 2126   labetalol (NORMODYNE) injection 5 mg, 5 mg, Intravenous, Q4H PRN, Cox, Amy N, DO   levothyroxine (SYNTHROID) tablet 150 mcg, 150 mcg, Oral, QAC breakfast, Cox, Amy N, DO, 150 mcg at 07/02/22 0612   LORazepam (ATIVAN) injection 1 mg, 1 mg, Intravenous, Q4H PRN, Cox, Amy N, DO   melatonin tablet 5 mg, 5 mg, Oral, QHS PRN, Cox, Amy N, DO   naphazoline-glycerin (CLEAR EYES REDNESS) ophth solution 1-2 drop, 1-2 drop, Both Eyes, QID PRN, Cox, Amy N, DO   nicotine polacrilex (NICORETTE) gum 2 mg, 2 mg, Oral, PRN, Cox, Amy N, DO   senna-docusate (Senokot-S) tablet 1 tablet, 1 tablet, Oral, QHS PRN, Cox, Amy N, DO  Labs CBC    Component Value Date/Time   WBC 7.0 07/01/2022 0805   RBC 4.34 07/01/2022 0805   HGB 14.3 07/01/2022 0805   HGB  14.8 03/11/2014 1025   HCT 41.2 07/01/2022 0805   HCT 44.0 03/11/2014 1025   PLT 209 07/01/2022 0805   PLT 200 03/11/2014 1025   MCV 94.9 07/01/2022 0805   MCV 98 03/11/2014 1025   MCH 32.9 07/01/2022 0805   MCHC 34.7 07/01/2022 0805   RDW 14.2 07/01/2022 0805   RDW 12.2 03/11/2014 1025   LYMPHSABS 1.4 07/01/2022 0805   MONOABS 0.9 07/01/2022 0805   EOSABS 0.2 07/01/2022 0805   BASOSABS 0.1 07/01/2022 0805  CMP     Component Value Date/Time   NA 137 07/01/2022 0805   NA 137 03/11/2014 1025   K 4.2 07/01/2022 0805   K 4.0 03/11/2014 1025   CL 101 07/01/2022 0805   CL 104 03/11/2014 1025   CO2 26 07/01/2022 0805   CO2 25 03/11/2014 1025   GLUCOSE 102 (H) 07/01/2022 0805   GLUCOSE 132 (H) 03/11/2014 1025   BUN 15  07/01/2022 0805   BUN 15 03/11/2014 1025   CREATININE 0.89 07/01/2022 0805   CREATININE 1.06 12/01/2019 1627   CALCIUM 9.7 07/01/2022 0805   CALCIUM 8.6 03/11/2014 1025   PROT 7.9 07/01/2022 0805   PROT 7.1 02/23/2018 1627   ALBUMIN 4.5 07/01/2022 0805   AST 25 07/01/2022 0805   ALT 18 07/01/2022 0805   ALKPHOS 57 07/01/2022 0805   BILITOT 0.9 07/01/2022 0805   GFRNONAA >60 07/01/2022 0805   GFRNONAA >60 03/11/2014 1025   GFRAA >60 01/11/2020 1609   GFRAA >60 03/11/2014 1025    glycosylated hemoglobin - pending  Lipid Panel     Component Value Date/Time   CHOL 127 07/02/2022 0421   CHOL 231 (H) 03/16/2014 0809   TRIG 302 (H) 07/02/2022 0421   HDL 37 (L) 07/02/2022 0421   HDL 40 03/16/2014 0809   CHOLHDL 3.4 07/02/2022 0421   VLDL 60 (H) 07/02/2022 0421   LDLCALC 30 07/02/2022 0421   LDLCALC 123 (H) 03/16/2014 0809   LDLDIRECT 65.0 07/12/2020 0832     Imaging I have reviewed images in epic and the results pertinent to this consultation are: CTH - NAICP CTA head+neck:  IMPRESSION: 1. Negative CT perfusion study. 2. No intracranial large vessel occlusion or flow limiting proximal stenosis. 3. Aortic atherosclerosis. 4. Extensive soft plaque at the right carotid bifurcation and ICA bulb with irregularity. 50% stenosis. There could be some soft plaque ulceration. At the distal bulb, there appears to be some trailing thrombus that could be a source of embolic disease. 5. 40% stenosis of the proximal ICA bulb on the left. 6. 30% stenosis at the left vertebral artery origin. Moderate stenosis at the right vertebral artery origin. Beyond that, both vertebral arteries are patent through the cervical region, but no flow is seen in the small distal right vertebral artery beyond the level of C1. This chronic distal right vertebral artery occlusion was seen in 2017 as well. There is retrograde flow in the distal right vertebral artery. 7. Previous treatment changes of the right  neck for prior head and neck cancer as described in the history. Aortic Atherosclerosis (ICD10-I70.0). MR brain w/o:  IMPRESSION: Multiple small punctate acute infarcts in the right MCA territory. Given findings on same day CTA head and neck angiogram, these are favored to be embolic. No hemorrhage.  2D echo: LVEF 55 to 07%, grade 1 diastolic dysfunction, normal mitral valve, mild aortic regurgitation, normal LA size, no interatrial shunt by color Doppler.  Assessment: 61 year old previous smoker, history of neuropathy hypothyroidism  hypertension hyperlipidemia depression anxiety and stage IIIb mucinous appendical carcinoma presents for left-sided numbness and weakness and was seen as acute code stroke.  IV thrombolysis not given due to low NIH stroke scale.  No emergent LVO but head and neck vessel imaging revealed extensive soft plaque at the right carotid bifurcation and ICA bulb with irregularity and 50% stenosis along with the soft plaque ulceration in the right ICA along with a trailing thrombus in the distal bulb of the right ICA. Case was discussed with neuroendovascular.  Recommend outpatient angio and possible revascularization in the coming week.  Impression: Acute ischemic stroke-likely secondary to right carotid symptomatic stenosis  Recommendations: DAPT (aspirin 81+ Plavix 75) for 3 months followed by ASA 81 only LDL at goal without statin. No need for statin from a stroke perspective a1C pending-follow-up with outpatient primary care.  Goal A1c less than 7. PT OT ST Outpatient cerebral angio with Dr. Estanislado Pandy in the next couple of weeks. He and his office aware - they will call patient to schedule. Return instructions provided Plan discussed with hospitalist Dr. Arbutus Ped over the phone. Please call inpatient neurology with questions as needed.  -- Amie Portland, MD Neurologist [facility] Pager: [phone]
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
## 2022-07-02 NOTE — TOC CM/SW Note (Signed)
  Transition of Care [facility]) Screening Note   Patient Details  Name: Patrick Bailey. Date of Birth: [birth year]   Transition of Care [facility]) CM/SW Contact:    Colen Darling, [place] Phone Number: 07/02/2022, 9:38 AM    Transition of Care Department [facility]) has reviewed patient and no TOC needs have been identified at this time. We will continue to monitor patient advancement through interdisciplinary progression rounds. If new patient transition needs arise, please place a TOC consult.

## 2022-07-07 ENCOUNTER — Other Ambulatory Visit (HOSPITAL_COMMUNITY): Payer: Self-pay | Admitting: Interventional Radiology

## 2022-07-07 DIAGNOSIS — I771 Stricture of artery: Secondary | ICD-10-CM

## 2022-07-10 ENCOUNTER — Ambulatory Visit (HOSPITAL_COMMUNITY)
Admission: RE | Admit: 2022-07-10 | Discharge: 2022-07-10 | Disposition: A | Discharge status: Home / Self Care | Payer: BC Managed Care – PPO | Source: Ambulatory Visit | Attending: Interventional Radiology | Admitting: Interventional Radiology

## 2022-07-10 DIAGNOSIS — I771 Stricture of artery: Secondary | ICD-10-CM

## 2022-07-10 DIAGNOSIS — I671 Cerebral aneurysm, nonruptured: Secondary | ICD-10-CM | POA: Diagnosis not present

## 2022-07-10 DIAGNOSIS — I6521 Occlusion and stenosis of right carotid artery: Secondary | ICD-10-CM | POA: Diagnosis not present

## 2022-07-13 HISTORY — PX: IR RADIOLOGIST EVAL & MGMT: IMG5224

## 2022-07-14 ENCOUNTER — Other Ambulatory Visit (HOSPITAL_COMMUNITY): Payer: Self-pay | Admitting: Interventional Radiology

## 2022-07-14 DIAGNOSIS — I771 Stricture of artery: Secondary | ICD-10-CM

## 2022-07-15 ENCOUNTER — Telehealth (HOSPITAL_COMMUNITY): Payer: Self-pay | Admitting: Radiology

## 2022-07-15 NOTE — Telephone Encounter (Signed)
Called pt to schedule angio with Deveshwar. No answer, left VM. JM

## 2022-07-20 ENCOUNTER — Other Ambulatory Visit (HOSPITAL_COMMUNITY): Payer: Self-pay | Admitting: Interventional Radiology

## 2022-07-20 ENCOUNTER — Ambulatory Visit (HOSPITAL_COMMUNITY)
Admission: RE | Admit: 2022-07-20 | Discharge: 2022-07-20 | Disposition: A | Discharge status: Home / Self Care | Payer: BC Managed Care – PPO | Source: Ambulatory Visit | Attending: Interventional Radiology | Admitting: Interventional Radiology

## 2022-07-20 ENCOUNTER — Other Ambulatory Visit: Payer: Self-pay

## 2022-07-20 DIAGNOSIS — I671 Cerebral aneurysm, nonruptured: Secondary | ICD-10-CM | POA: Diagnosis not present

## 2022-07-20 DIAGNOSIS — Z87891 Personal history of nicotine dependence: Secondary | ICD-10-CM | POA: Diagnosis not present

## 2022-07-20 DIAGNOSIS — Z8500 Personal history of malignant neoplasm of unspecified digestive organ: Secondary | ICD-10-CM | POA: Insufficient documentation

## 2022-07-20 DIAGNOSIS — I771 Stricture of artery: Secondary | ICD-10-CM

## 2022-07-20 DIAGNOSIS — Z823 Family history of stroke: Secondary | ICD-10-CM | POA: Insufficient documentation

## 2022-07-20 DIAGNOSIS — I6523 Occlusion and stenosis of bilateral carotid arteries: Secondary | ICD-10-CM | POA: Diagnosis not present

## 2022-07-20 DIAGNOSIS — I6521 Occlusion and stenosis of right carotid artery: Secondary | ICD-10-CM | POA: Diagnosis not present

## 2022-07-20 DIAGNOSIS — Z8249 Family history of ischemic heart disease and other diseases of the circulatory system: Secondary | ICD-10-CM | POA: Insufficient documentation

## 2022-07-20 DIAGNOSIS — I2510 Atherosclerotic heart disease of native coronary artery without angina pectoris: Secondary | ICD-10-CM | POA: Diagnosis not present

## 2022-07-20 DIAGNOSIS — I10 Essential (primary) hypertension: Secondary | ICD-10-CM | POA: Insufficient documentation

## 2022-07-20 HISTORY — PX: IR US GUIDE VASC ACCESS RIGHT: IMG2390

## 2022-07-20 HISTORY — PX: IR ANGIO INTRA EXTRACRAN SEL COM CAROTID INNOMINATE BILAT MOD SED: IMG5360

## 2022-07-20 HISTORY — PX: IR ANGIO VERTEBRAL SEL SUBCLAVIAN INNOMINATE UNI L MOD SED: IMG5364

## 2022-07-20 LAB — BASIC METABOLIC PANEL
Anion gap: 8 (ref 5–15)
BUN: 15 mg/dL (ref 8–23)
CO2: 24 mmol/L (ref 22–32)
Calcium: 8.6 mg/dL — ABNORMAL LOW (ref 8.9–10.3)
Chloride: 102 mmol/L (ref 98–111)
Creatinine, Ser: 0.83 mg/dL (ref 0.61–1.24)
GFR, Estimated: >60 mL/min (ref >60)
Glucose, Bld: 102 mg/dL — ABNORMAL HIGH (ref 70–99)
Potassium: 3.9 mmol/L (ref 3.5–5.1)
Sodium: 134 mmol/L — ABNORMAL LOW (ref 135–145)

## 2022-07-20 LAB — CBC
HCT: 36.2 % — ABNORMAL LOW (ref 39.0–52.0)
Hemoglobin: 12.5 g/dL — ABNORMAL LOW (ref 13.0–17.0)
MCH: 33.9 pg (ref 26.0–34.0)
MCHC: 34.5 g/dL (ref 30.0–36.0)
MCV: 98.1 fL (ref 80.0–100.0)
Platelets: 250 10*3/uL (ref 150–400)
RBC: 3.69 MIL/uL — ABNORMAL LOW (ref 4.22–5.81)
RDW: 13.9 % (ref 11.5–15.5)
WBC: 5.9 10*3/uL (ref 4.0–10.5)
nRBC: 0 % (ref 0.0–0.2)

## 2022-07-20 LAB — PROTIME-INR
INR: 1.2 (ref 0.8–1.2)
Prothrombin Time: 15.2 seconds (ref 11.4–15.2)

## 2022-07-20 MED ORDER — LIDOCAINE HCL 1 % IJ SOLN
INTRAMUSCULAR | Status: AC
  Filled 2022-07-20: qty 20

## 2022-07-20 MED ORDER — NITROGLYCERIN 1 MG/10 ML FOR IR/CATH LAB
INTRA_ARTERIAL | Status: AC
  Filled 2022-07-20: qty 10

## 2022-07-20 MED ORDER — SODIUM CHLORIDE 0.9 % IV SOLN: INTRAVENOUS | Status: AC

## 2022-07-20 MED ORDER — FENTANYL CITRATE (PF) 100 MCG/2ML IJ SOLN
INTRAMUSCULAR | Status: AC | PRN
  Administered 2022-07-20: 25 ug via INTRAVENOUS

## 2022-07-20 MED ORDER — HEPARIN SODIUM (PORCINE) 1000 UNIT/ML IJ SOLN
INTRAMUSCULAR | Status: AC
  Filled 2022-07-20: qty 10

## 2022-07-20 MED ORDER — FENTANYL CITRATE (PF) 100 MCG/2ML IJ SOLN
INTRAMUSCULAR | Status: AC
  Filled 2022-07-20: qty 2

## 2022-07-20 MED ORDER — VERAPAMIL HCL 2.5 MG/ML IV SOLN
INTRAVENOUS | Status: AC
  Filled 2022-07-20: qty 2

## 2022-07-20 MED ORDER — IOHEXOL 300 MG/ML  SOLN: 150.0000 mL | Freq: Once | INTRAMUSCULAR | Status: DC | PRN

## 2022-07-20 MED ORDER — MIDAZOLAM HCL 2 MG/2ML IJ SOLN
INTRAMUSCULAR | Status: AC | PRN
  Administered 2022-07-20: 1 mg via INTRAVENOUS

## 2022-07-20 MED ORDER — HEPARIN SODIUM (PORCINE) 1000 UNIT/ML IJ SOLN
INTRAMUSCULAR | Status: AC | PRN
  Administered 2022-07-20: 1000 [IU] via INTRAVENOUS

## 2022-07-20 MED ORDER — VERAPAMIL HCL 2.5 MG/ML IV SOLN: INTRA_ARTERIAL | Status: AC | PRN

## 2022-07-20 MED ORDER — NITROGLYCERIN 1 MG/10 ML FOR IR/CATH LAB: INTRA_ARTERIAL | Status: AC | PRN

## 2022-07-20 MED ORDER — MIDAZOLAM HCL 2 MG/2ML IJ SOLN
INTRAMUSCULAR | Status: AC
  Filled 2022-07-20: qty 2

## 2022-07-20 MED ORDER — SODIUM CHLORIDE 0.9 % IV SOLN: INTRAVENOUS | Status: DC

## 2022-07-20 NOTE — H&P (Signed)
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 femoral, and popliteal veins, as well as the visualized calf veins. Visualized portions of profunda femoral vein and great saphenous vein unremarkable. No filling defects to suggest DVT on grayscale or color Doppler imaging. Doppler waveforms show normal direction of venous flow, normal respiratory plasticity and response to augmentation. OTHER Calcified density within the right popliteal fossa measuring up to 21 mm in greatest dimension may represent postsurgical change or an intracystic calcification within the a collapsed Baker's cyst, previously noted on plain radiograph of 12/08/2021., Limitations: none IMPRESSION: 1. No femoropopliteal DVT within the lower extremities. Electronically Signed   By: Fidela Salisbury M.D.   On: 07/01/2022 17:23   MR Brain W and Wo Contrast  Result Date: 07/01/2022 CLINICAL DATA:  Neuro deficit.  Left-sided weakness. EXAM: MRI HEAD WITHOUT AND WITH CONTRAST TECHNIQUE: Multiplanar, multiecho pulse sequences of the brain and surrounding structures were obtained without and with intravenous contrast. CONTRAST:  70m  GADAVIST GADOBUTROL 1 MMOL/ML IV SOLN COMPARISON:  Same day CT Head and CT head/neck angiogram FINDINGS: Brain: There are multiple small punctate foci of acute infarcts in the  right MCA territory involving the right frontal lobe, right parietal lobe, and the corona radiata. Given findings on same day CTA head and neck angiogram, these are favored to be embolic. Sequela of mild chronic microvascular ischemic change. No hemorrhage. No extra-axial fluid collection. No contrast-enhancing lesion is visualized. Vascular: Normal flow voids. Skull and upper cervical spine: Normal marrow signal. Sinuses/Orbits: Normal-appearing orbits. Mild mucosal thickening bilateral sphenoid, ethmoid, and maxillary sinuses. Other: None. IMPRESSION: Multiple small punctate acute infarcts in the right MCA territory. Given findings on same day CTA head and neck angiogram, these are favored to be embolic. No hemorrhage. Electronically Signed   By: Marin Roberts M.D.   On: 07/01/2022 12:42   CT ANGIO HEAD NECK W WO CM W PERF (CODE STROKE)  Result Date: 07/01/2022 CLINICAL DATA:  Neuro deficit, acute, stroke suspected. EXAM: CT ANGIOGRAPHY HEAD AND NECK CT PERFUSION BRAIN TECHNIQUE: Multidetector CT imaging of the head and neck was performed using the standard protocol during bolus administration of intravenous contrast. Multiplanar CT image reconstructions and MIPs were obtained to evaluate the vascular anatomy. Carotid stenosis measurements (when applicable) are obtained utilizing NASCET criteria, using the distal internal carotid diameter as the denominator. Multiphase CT imaging of the brain was performed following IV bolus contrast injection. Subsequent parametric perfusion maps were calculated using RAPID software. RADIATION DOSE REDUCTION: This exam was performed according to the departmental dose-optimization program which includes automated exposure control, adjustment of the mA and/or kV according to patient size and/or use of  iterative reconstruction technique. CONTRAST:  134m OMNIPAQUE IOHEXOL 350 MG/ML SOLN COMPARISON:  Head CT earlier same day. Previous CT angiography 07/11/2016. FINDINGS: CTA NECK FINDINGS Aortic arch: Aortic atherosclerosis. Branching pattern is normal without origin stenosis. Right carotid system: Common carotid artery shows scattered plaque but is widely patent to the bifurcation region. Extensive soft plaque at the bifurcation and ICA bulb with narrowing and irregularity. There could be some soft plaque ulceration. At the distal bulb, there could be some trailing thrombus that could be a source of embolic disease. Minimal diameter in the region is 2.5 mm. Compared to a more distal cervical ICA diameter of 5 mm, this indicates a 50% stenosis. Left carotid system: Common carotid artery shows scattered plaque but no significant stenosis. There is calcified plaque at the carotid bifurcation and ICA bulb. Minimal diameter is 3 mm. Compared to a more distal cervical ICA diameter of 5 mm, this indicates a 40% stenosis. Vertebral arteries: Left vertebral artery is dominant. 30% stenosis at the left vertebral artery origin. Three right vertebral artery is a tiny vessel with moderate stenosis at its origin. Beyond that, both vertebral arteries are patent through the cervical region, but no flow is seen in the small distal right vertebral artery beyond the level of C1. distal right vertebral artery occlusion was also seen in 2017. Skeleton: Chronic degenerative spondylosis. No acute finding. Other neck: Previous treatment changes of the right neck for prior head and neck cancer as described in the history. No sign mass lesion or adenopathy. Upper chest: Mild scarring and emphysematous change at the lung apices. Review of the MIP images confirms the above findings CTA HEAD FINDINGS Anterior circulation: Both internal carotid arteries are patent through the skull base and siphon regions. There is siphon atherosclerosis with  stenosis estimated at 50% on both sides. No large vessel occlusion or flow limiting proximal stenosis. Posterior circulation: Dominant left vertebral artery widely patent through the foramen magnum to the basilar artery. No basilar stenosis. Superior  cerebellar and posterior cerebral arteries are patent. 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 There is retrograde flow in the distal right vertebral artery. Venous sinuses: Patent and normal. Anatomic variants: None other significant. Review of the MIP images confirms the above findings CT Brain Perfusion Findings: ASPECTS: 10 CBF (<30%) Volume: 41m Perfusion (Tmax>6.0s) volume: 0677mMismatch Volume: 77m46mnfarction Location:None IMPRESSION: 1. Negative CT perfusion study. 2. No intracranial large vessel occlusion or flow limiting proximal stenosis. 3. Aortic atherosclerosis. 4. Extensive soft plaque at the right carotid bifurcation and ICA bulb with irregularity. 50% stenosis. There could be some soft plaque ulceration. At the distal bulb, there appears to be some trailing thrombus that could be a source of embolic disease. 5. 40% stenosis of the proximal ICA bulb on the left. 6. 30% stenosis at the left vertebral artery origin. Moderate stenosis at the right vertebral artery origin. Beyond that, both vertebral arteries are patent through the cervical region, but no flow is seen in the small distal right vertebral artery beyond the level of C1. This chronic distal right vertebral artery occlusion was seen in 2017 as well. There is retrograde flow in the distal right vertebral artery. 7. Previous treatment changes of the right neck for prior head and neck cancer as described in the history. 8. These results were called by telephone at the time of interpretation on 07/01/2022 at 8:27 am to provider Dr. IsaEllender Hoseho verbally acknowledged these results. Aortic Atherosclerosis (ICD10-I70.0). Electronically Signed   By: MarNelson Chimes.D.   On: 07/01/2022 08:29   CT HEAD CODE STROKE WO CONTRAST  Result Date: 07/01/2022 CLINICAL DATA:  Code stroke.  Left-sided weakness and numbness. EXAM: CT HEAD WITHOUT CONTRAST TECHNIQUE: Contiguous axial images were obtained from the base of the skull through the vertex without intravenous contrast. RADIATION DOSE REDUCTION: This exam was performed according to the departmental dose-optimization program which includes automated exposure control, adjustment of the mA and/or kV according to patient size and/or use of iterative reconstruction technique. COMPARISON:  Head CT 01/11/2020 FINDINGS: Brain: No evidence of acute infarction, hemorrhage, hydrocephalus, extra-axial collection or mass lesion/mass effect. Vascular: No hyperdense vessel or unexpected calcification. Skull: Normal. Negative for fracture or focal lesion. Sinuses/Orbits: No acute finding. Other: Prelim sent in epic chat with confirmation. ASPECTS ([facility] Early [facility] Score) - Ganglionic level infarction (caudate, lentiform nuclei, internal capsule, insula, M1-M3 cortex): 7 - Supraganglionic infarction (M4-M6 cortex): 3 Total score (0-10 with 10 being normal): 10 IMPRESSION: 1. Stable head CT.  No hemorrhage or visible infarct. 2. ASPECTS is 10. Electronically Signed   By: JonJorje GuildD.   On: 07/01/2022 07:25    Labs:  CBC: Recent Labs    11/17/21 1654 11/21/21 1107 11/25/21 0812 07/01/22 0805  WBC 8.2 2.8* 7.5 7.0  HGB 14.4 11.9* 11.7* 14.3  HCT 42.6 35.7* 37.0* 41.2  PLT 290 330 445* 209    COAGS: Recent Labs    07/01/22 0805  INR 1.0  APTT 28    BMP: Recent Labs    11/22/21 0435 11/23/21 0436 11/23/21 1004 11/24/21 0414 07/01/22 0805  NA 133*  --  134* 135 137  K 3.7  --  4.0 4.2 4.2  CL 102  --  101 106 101  CO2 23  --  _0 GLUCOSE 124*  --  129* 129* 102*  BUN 11  --  _1 CALCIUM 8.3*  --  9.1 8.6* 9.7  CREATININE 0.75 0.77 0.74 0.71 0.89  GFRNONAA >60 >60 >60 >60  >60    LIVER FUNCTION TESTS: Recent Labs    11/14/21 1845 11/22/21 0435 11/24/21 0414 07/01/22 0805  BILITOT 0.8 1.5* 0.6 0.9  AST 45* _0 ALT 37 35 34 18  ALKPHOS 75 59 73 57  PROT 7.8 6.0* 6.1* 7.9  ALBUMIN 4.3 2.7* 2.8* 4.5    TUMOR MARKERS: No results for input(s): "AFPTM", "CEA", "CA199", "CHROMGRNA" in the last 8760 hours.  Assessment and Plan:  Right MCA Stroke/Right ICA stenosis: Patrick R. Augustin Schooling., 61 year old male, presents today to the [facility] department for an image-guided diagnostic cerebral angiogram. He has been NPO. He did take his plavix and aspirin this morning. Labs are pending but will be reviewed prior to the start of the procedure.  Risks and benefits of this procedure were discussed with the patient including, but not limited to bleeding, infection, vascular injury or contrast induced renal failure. The risk of a stroke from this procedure was also discussed.   This interventional procedure involves the use of X-rays and because of the nature of the planned procedure, it is possible that we will have prolonged use of X-ray fluoroscopy.  Potential radiation risks to you include (but are not limited to) the following: - A slightly elevated risk for cancer  several years later in life. This risk is typically less than 0.5% percent. This risk is low in comparison to the normal incidence of human cancer, which is 33% for women and 50% for men according to the Naplate. - Radiation induced injury can include skin redness, resembling a rash, tissue breakdown / ulcers and hair loss (which can be temporary or permanent).   The likelihood of either of these occurring depends on the difficulty of the procedure and whether you are sensitive to radiation due to previous procedures, disease, or genetic conditions.   IF your procedure requires a prolonged use of radiation, you will be notified and given written  instructions for further action.  It is your responsibility to monitor the irradiated area for the 2 weeks following the procedure and to notify your physician if you are concerned that you have suffered a radiation induced injury.    All of the patient's questions were answered, patient is agreeable to proceed.  Consent signed and in chart.  Thank you for this interesting consult.  I greatly enjoyed meeting Patrick Bailey. and look forward to participating in their care.  A copy of this report was sent to the requesting provider on this date.  Electronically Signed: Soyla Dryer, AGACNP-BC [phone] 07/20/2022, 8:29 AM   I spent a total of  30 Minutes   in face to face in clinical consultation, greater than 50% of which was counseling/coordinating care for diagnostic cerebral angiogram.

## 2022-07-20 NOTE — Sedation Documentation (Signed)
RN called lab who stated they do not have blood on this patient, but that they did receive unlabeled blood from short stay that could be his, however, unable to run it, since it is not labeled properly. Dr. Estanislado Pandy notified who stated he will get blood to send to the lab at some point during the procedure.

## 2022-07-20 NOTE — Progress Notes (Signed)
TR band removed and new dressing along w/ arm brace in place. No bleeding or hematoma noted. Will continue to monitor.

## 2022-07-20 NOTE — Procedures (Signed)
INR. Status post bilateral common carotid arteriograms and left vertebral arteriogram.  Right radial approach.  Findings.  1.Approximately 55% stenosis of the right internal carotid artery just distal to the bulb with segmental area of caliber irregularity involving the posterior wall with small ulcerations. 2.Nondominant right vertebro basilar  junction fills from the contralateral dominant left vertebral artery. 3.Approximately 50% stenosis of the dominant left vertebral artery at its origin. Arlean Hopping MD

## 2022-07-20 NOTE — Progress Notes (Signed)
D/c information given to dad (over the phone) and to pt, written and verbal. All questions and concerns addressed.

## 2022-07-20 NOTE — Sedation Documentation (Signed)
Hand off of care given to short stay nurse at bedside. NO bleeding or hematoma noted at right radial cite.

## 2022-07-20 NOTE — Sedation Documentation (Signed)
Blood work collected and sent to lab

## 2022-07-22 ENCOUNTER — Other Ambulatory Visit (HOSPITAL_COMMUNITY): Payer: Self-pay | Admitting: Interventional Radiology

## 2022-07-22 DIAGNOSIS — I771 Stricture of artery: Secondary | ICD-10-CM

## 2022-08-05 DIAGNOSIS — C181 Malignant neoplasm of appendix: Principal | ICD-10-CM

## 2022-08-06 ENCOUNTER — Other Ambulatory Visit (HOSPITAL_COMMUNITY): Payer: Self-pay | Admitting: Radiology

## 2022-08-06 ENCOUNTER — Other Ambulatory Visit (HOSPITAL_COMMUNITY)
Admission: RE | Admit: 2022-08-06 | Discharge: 2022-08-06 | Disposition: A | Discharge status: Home / Self Care | Payer: BC Managed Care – PPO | Attending: Interventional Radiology | Admitting: Interventional Radiology

## 2022-08-06 ENCOUNTER — Other Ambulatory Visit (HOSPITAL_COMMUNITY): Payer: Self-pay

## 2022-08-06 ENCOUNTER — Telehealth (HOSPITAL_COMMUNITY): Payer: Self-pay | Admitting: Radiology

## 2022-08-06 ENCOUNTER — Other Ambulatory Visit: Payer: Self-pay | Admitting: Radiology

## 2022-08-06 ENCOUNTER — Ambulatory Visit: Admit: 2022-08-06 | Discharge: 2022-08-07 | Payer: PRIVATE HEALTH INSURANCE

## 2022-08-06 ENCOUNTER — Ambulatory Visit
Admit: 2022-08-06 | Discharge: 2022-08-07 | Payer: PRIVATE HEALTH INSURANCE | Attending: Hematology & Oncology | Primary: Hematology & Oncology

## 2022-08-06 DIAGNOSIS — C181 Malignant neoplasm of appendix: Principal | ICD-10-CM

## 2022-08-06 DIAGNOSIS — Z96653 Presence of artificial knee joint, bilateral: Secondary | ICD-10-CM | POA: Diagnosis not present

## 2022-08-06 DIAGNOSIS — E039 Hypothyroidism, unspecified: Secondary | ICD-10-CM | POA: Diagnosis not present

## 2022-08-06 DIAGNOSIS — I771 Stricture of artery: Secondary | ICD-10-CM

## 2022-08-06 DIAGNOSIS — Z87891 Personal history of nicotine dependence: Secondary | ICD-10-CM | POA: Diagnosis not present

## 2022-08-06 DIAGNOSIS — I69359 Hemiplegia and hemiparesis following cerebral infarction affecting unspecified side: Secondary | ICD-10-CM | POA: Diagnosis not present

## 2022-08-06 DIAGNOSIS — R911 Solitary pulmonary nodule: Secondary | ICD-10-CM | POA: Diagnosis not present

## 2022-08-06 DIAGNOSIS — I2510 Atherosclerotic heart disease of native coronary artery without angina pectoris: Secondary | ICD-10-CM | POA: Diagnosis not present

## 2022-08-06 DIAGNOSIS — I10 Essential (primary) hypertension: Secondary | ICD-10-CM | POA: Diagnosis not present

## 2022-08-06 DIAGNOSIS — I6521 Occlusion and stenosis of right carotid artery: Secondary | ICD-10-CM

## 2022-08-06 NOTE — Telephone Encounter (Signed)
Pt returned my call. States that he will come to [facility] today for his P2Y12 blood test. I will put the order in now. JM

## 2022-08-06 NOTE — Telephone Encounter (Signed)
Tried to call pt, no answer and no VM. Pt was supposed to come to [facility] for a P2Y12 blood test for his upcoming stenosis treatment with Deveshwar. He was supposed to have this test on 12/29. He was a no show for the blood work. Will try to reach him today and let him know that Dr. Estanislado Pandy may reschedule his procedure since he does not have that blood work resulted. JM

## 2022-08-07 ENCOUNTER — Other Ambulatory Visit: Payer: Self-pay | Admitting: Student

## 2022-08-07 ENCOUNTER — Other Ambulatory Visit: Payer: Self-pay | Admitting: Internal Medicine

## 2022-08-07 ENCOUNTER — Other Ambulatory Visit: Payer: Self-pay

## 2022-08-07 ENCOUNTER — Encounter (HOSPITAL_COMMUNITY): Payer: Self-pay | Admitting: Interventional Radiology

## 2022-08-07 NOTE — Progress Notes (Signed)
Interventional Radiology Brief Note:  Patient anticipated for R ICA stenosis intervention with Dr. Estanislado Pandy Monday, 1/8.  He is on Plavix '75mg'$ , aspirin '81mg'$ .  P2Y12 175.   Continue current Plavix/aspirin dosing and consider loading with additional '75mg'$  (total of '150mg'$ ) prior to procedure Monday.   Brynda Greathouse, MS RD PA-C

## 2022-08-07 NOTE — Progress Notes (Signed)
S.D.W- Instructions   Your procedure is scheduled on Mon., Jan. 8, 2024 from 8:30AM-12:09PM.  Report to [facility] Main Entrance "A" at 6:00 A.M., then check in with the Admitting office.  Call this number if you have problems the morning of surgery:  [phone]             If you experience any cold or flu symptoms such as cough, fever, chills, shortness of breath, etc. between now and your scheduled surgery, please notify us at the above         number.  Masks are now required throughout our facilities due to the increasing cases of Covid, Flu, and RSV infections.   Remember:  Do not eat after midnight on Jan. 7th    Take these medicines the morning of surgery with A SIP OF WATER:  AmLODipine (NORVASC)  Aspirin x4  Atorvastatin (LIPITOR)  Clopidogrel (PLAVIX)  Famotidine (PEPCID)  Fenofibrate  Gabapentin (NEURONTIN)  Metoprolol tartrate (LOPRESSOR)   If Needed: MITIGARE   As of today, STOP taking any Aspirin (unless otherwise instructed by your surgeon) Aleve, Naproxen, Ibuprofen, Motrin, Advil, Goody's, BC's, all herbal medications, fish oil, and all vitamins.          Do not wear jewelry. Do not wear lotions, powders, cologne or deodorant. Do not shave 48 hours prior to surgery.  Men may shave face and neck. Do not bring valuables to the hospital.  [facility] is not responsible for any belongings or valuables.    Do NOT Smoke (Tobacco/Vaping)  24 hours prior to your procedure  If you use a CPAP at night, you may bring your mask for your overnight stay.   Contacts, glasses, hearing aids, dentures or partials may not be worn into surgery, please bring cases for these belongings   For patients admitted to the hospital, discharge time will be determined by your treatment team.   Patients discharged the day of surgery will not be allowed to drive home, and someone needs to stay with them for 24 hours.  Special instructions:    Oral Hygiene is also important to reduce  your risk of infection.  Remember - BRUSH YOUR TEETH THE MORNING OF SURGERY WITH YOUR REGULAR TOOTHPASTE  [place]- Preparing For Surgery  Before surgery, you can play an important role. Because skin is not sterile, your skin needs to be as free of germs as possible. You can reduce the number of germs on your skin by washing with Antibacterial Soap before surgery.     Please follow these instructions carefully.     Shower the NIGHT BEFORE SURGERY and the MORNING OF SURGERY with Antibacterial Soap.   Pat yourself dry with a CLEAN TOWEL.  Wear CLEAN PAJAMAS to bed the night before surgery  Place CLEAN SHEETS on your bed the night before your surgery  DO NOT SLEEP WITH PETS.  Day of Surgery:  Take a shower with Antibacterial soap. Wear Clean/Comfortable clothing the morning of surgery Do not apply any deodorants/lotions.   Remember to brush your teeth WITH YOUR REGULAR TOOTHPASTE.   If you test positive for Covid, or been in contact with anyone that has tested positive in the last 10 days, please notify your surgeon.  SURGICAL WAITING ROOM VISITATION Patients having surgery or a procedure may have no more than 2 support people in the waiting area - these visitors may rotate.   Children under the age of 71 must have an adult with them who is not the  patient. If the patient needs to stay at the hospital during part of their recovery, the visitor guidelines for inpatient rooms apply. Pre-op nurse will coordinate an appropriate time for 1 support person to accompany patient in pre-op.  This support person may not rotate.   Please refer to the [facility] website for the visitor guidelines for Inpatients (after your surgery is over and you are in a regular room).

## 2022-08-07 NOTE — Anesthesia Preprocedure Evaluation (Signed)
Anesthesia Evaluation  Patient identified by MRN, date of birth, ID band Patient awake    Reviewed: Allergy & Precautions, NPO status , Patient's Chart, lab work & pertinent test results, reviewed documented beta blocker date and time   History of Anesthesia Complications Negative for: history of anesthetic complications  Airway Mallampati: III  TM Distance: >3 FB Neck ROM: Full    Dental  (+) Edentulous Upper   Pulmonary former smoker   Pulmonary exam normal        Cardiovascular hypertension, Pt. on medications and Pt. on home beta blockers + CAD  Normal cardiovascular exam     Neuro/Psych   Anxiety     CVA (2017 and 06/2022)    GI/Hepatic Neg liver ROS,GERD  Medicated,,  Endo/Other  Hypothyroidism    Renal/GU negative Renal ROS     Musculoskeletal  (+) Arthritis ,    Abdominal   Peds  Hematology negative hematology ROS (+)   Anesthesia Other Findings Day of surgery medications reviewed with patient.  Reproductive/Obstetrics                              Anesthesia Physical Anesthesia Plan  ASA: 4  Anesthesia Plan: General   Post-op Pain Management: Tylenol PO (pre-op)*   Induction: Intravenous  PONV Risk Score and Plan: 2 and Treatment may vary due to age or medical condition, Dexamethasone and Ondansetron  Airway Management Planned: Oral ETT  Additional Equipment: Arterial line  Intra-op Plan:   Post-operative Plan: Extubation in OR  Informed Consent: I have reviewed the patients History and Physical, chart, labs and discussed the procedure including the risks, benefits and alternatives for the proposed anesthesia with the patient or authorized representative who has indicated his/her understanding and acceptance.     Dental advisory given  Plan Discussed with: CRNA  Anesthesia Plan Comments: (PAT note by Karoline Caldwell, PA-C:  Recent admission 11/29 through  07/02/2022 for acute ischemic right MCA stroke.  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  Echo during admission showed EF 55 to 60%, grade 1 DD, normal right ventricular function, mild AR regurgitation.  Seen by neurology, recommended continuing DAPT with aspirin and Plavix and close follow-up with outpatient neurointerventional radiology to address right carotid stenosis.  Recent dx appendiceal goblet cell adenocarcinoma in setting of acute appendicitis s/p R hemicolectomy 11/2021, tumor invaded visceral peritoneum and 2/20 pos lymph nodes.  Following with oncology at [facility] - currently on adjuvant chemotherapy with capecitabine. Side effect of hand foot syndrome with peeling and diarrhea. Using urea cream to hands and feet with benefit.   BMP and CBC from 07/20/2022 reviewed, mild hyponatremia sodium 134, mild anemia with hemoglobin 12.5, otherwise unremarkable.  Patient will need day of surgery evaluation.  EKG 07/01/2022: Sinus rhythm.  Rate 69.  IR angiography 07/20/2022: IMPRESSION: Approximately 55% stenosis of the proximal right internal carotid artery related to long segment plaque along the posterior wall extending from the bulb. Associated small focal outpouching consistent with micro ulcerations along the posterior wall of the left internal carotid artery at the bulb and just distal to this.   50% stenosis of the left external carotid artery at its origin.   30% stenosis  of the left internal carotid artery proximally at the origin of the bulb associated small ulceration evident in an atherosclerotic plaque just distal to the bulb.   Approximately 2.1 mm x 4.1 mm wide outpouching  from the posterior wall of the left internal carotid artery at the level of the origin of the ophthalmic artery probably representing a small aneurysm.   PLAN: Findings reviewed with the patient.   Given the symptomatic nature of the right-sided proximal internal carotid artery stenosis associated with complex focal areas of outpouching as described above, the option of a endovascular revascularization using stent assisted angioplasty to prevent further neurologic events was reviewed. Also discussed was the option of continued dual antiplatelet therapy and conservative follow-up with ultrasound was reviewed.   The patient has elected to proceed with endovascular revascularization of the right internal carotid artery proximally. This will be scheduled at the earliest possible. In the meantime, the patient was advised to continue with the present anti-platelet regimen, occluding the other prescribed medications. Patient also advised to maintain adequate hydration.  TTE 07/01/2022:  1. Left ventricular ejection fraction, by estimation, is 55 to 60%. The  left ventricle has normal function. The left ventricle has no regional  wall motion abnormalities. Left ventricular diastolic parameters are  consistent with Grade I diastolic  dysfunction (impaired relaxation).   2. Right ventricular systolic function is normal. The right ventricular  size is normal.   3. The mitral valve is normal in structure. Trivial mitral valve  regurgitation.   4. The aortic valve is tricuspid. Aortic valve regurgitation is mild.   5. Aortic dilatation noted. There is mild dilatation of the aortic root,  measuring 40 mm.   6. The inferior vena cava is normal in size with greater than 50%  respiratory variability, suggesting right atrial pressure of 3 mmHg.    )         Anesthesia Quick Evaluation

## 2022-08-07 NOTE — Progress Notes (Signed)
PCP - Dr. Danise Mina  Cardiologist - Denies  EP- Denies  Endocrine- Denies  Pulm- Denies  Chest x-ray - 04/10/22 (E)  EKG - 07/01/22 (E)  Stress Test - Denies  ECHO - 07/01/22 (E)  Cardiac Cath - Denies  AICD-na PM-na LOOP-na  Nerve Stimulator- Denies  Dialysis- Denies  Sleep Study - Denies CPAP - Denies  LABS- 08/10/22: PT/INR 08/06/22(E): P2Y12 (P), CBC w/D, CMP  ASA- Cont. PLAVIX- Cont.  ERAS- No  HA1C- Denies  Anesthesia- Yes- per order  Pt denies having chest pain, sob, or fever during the pre-op phone call. All instructions explained to the pt, with a verbal understanding of the material. Pt also instructed to wear a mask and social distance if he goes. The opportunity to ask questions was provided.
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
## 2022-08-07 NOTE — H&P (Deleted)
  The note originally documented on this encounter has been moved the the encounter in which it belongs.  

## 2022-08-07 NOTE — Progress Notes (Signed)
Anesthesia Chart Review: Same day workup  Recent admission 11/29 through 07/02/2022 for acute ischemic right MCA stroke.  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  Echo during admission showed EF 55 to 60%, grade 1 DD, normal right ventricular function, mild AR regurgitation.  Seen by neurology, recommended continuing DAPT with aspirin and Plavix and close follow-up with outpatient neurointerventional radiology to address right carotid stenosis.  Recent dx appendiceal goblet cell adenocarcinoma in setting of acute appendicitis s/p R hemicolectomy 11/2021, tumor invaded visceral peritoneum and 2/20 pos lymph nodes.  Following with oncology at [facility] - currently on adjuvant chemotherapy with capecitabine. Side effect of hand foot syndrome with peeling and diarrhea. Using urea cream to hands and feet with benefit.   BMP and CBC from 07/20/2022 reviewed, mild hyponatremia sodium 134, mild anemia with hemoglobin 12.5, otherwise unremarkable.  Patient will need day of surgery evaluation.  EKG 07/01/2022: Sinus rhythm.  Rate 69.  IR angiography 07/20/2022: IMPRESSION: Approximately 55% stenosis of the proximal right internal carotid artery related to long segment plaque along the posterior wall extending from the bulb. Associated small focal outpouching consistent with micro ulcerations along the posterior wall of the left internal carotid artery at the bulb and just distal to this.   50% stenosis of the left external carotid artery at its origin.   30% stenosis of the left internal carotid artery proximally at the origin of the bulb associated small ulceration evident in an atherosclerotic plaque  just distal to the bulb.   Approximately 2.1 mm x 4.1 mm wide outpouching from the posterior wall of the left internal carotid artery at the level of the origin of the ophthalmic artery probably representing a small aneurysm.   PLAN: Findings reviewed with the patient.   Given the symptomatic nature of the right-sided proximal internal carotid artery stenosis associated with complex focal areas of outpouching as described above, the option of a endovascular revascularization using stent assisted angioplasty to prevent further neurologic events was reviewed. Also discussed was the option of continued dual antiplatelet therapy and conservative follow-up with ultrasound was reviewed.   The patient has elected to proceed with endovascular revascularization of the right internal carotid artery proximally. This will be scheduled at the earliest possible. In the meantime, the patient was advised to continue with the present anti-platelet regimen, occluding the other prescribed medications. Patient also advised to maintain adequate hydration.  TTE 07/01/2022:  1. Left ventricular ejection fraction, by estimation, is 55 to 60%. The  left ventricle has normal function. The left ventricle has no regional  wall motion abnormalities. Left ventricular diastolic parameters are  consistent with Grade I diastolic  dysfunction (impaired relaxation).   2. Right ventricular systolic function is normal. The right ventricular  size is normal.   3. The mitral valve is normal in structure. Trivial mitral valve  regurgitation.   4. The aortic valve is tricuspid. Aortic valve regurgitation is mild.   5. Aortic dilatation noted. There is mild dilatation of the aortic root,  measuring 40 mm.   6. The inferior vena cava is normal in size with greater than 50%  respiratory variability, suggesting right atrial pressure of 3 mmHg.    Karoline Caldwell, PA-C [facility] Short Stay [facility]/Anesthesiology Phone [phone])  [phone] 08/07/2022 12:20 PM

## 2022-08-10 ENCOUNTER — Encounter (HOSPITAL_COMMUNITY)
Admission: RE | Disposition: A | Discharge status: Home / Self Care | Payer: Self-pay | Source: Home / Self Care | Attending: Interventional Radiology

## 2022-08-10 ENCOUNTER — Ambulatory Visit (HOSPITAL_COMMUNITY): Payer: BC Managed Care – PPO | Admitting: Physician Assistant

## 2022-08-10 ENCOUNTER — Encounter (HOSPITAL_COMMUNITY): Payer: Self-pay

## 2022-08-10 ENCOUNTER — Observation Stay (HOSPITAL_COMMUNITY)
Admission: RE | Admit: 2022-08-10 | Discharge: 2022-08-11 | Disposition: A | Discharge status: Home / Self Care | Payer: BC Managed Care – PPO | Attending: Interventional Radiology | Admitting: Interventional Radiology

## 2022-08-10 ENCOUNTER — Ambulatory Visit (HOSPITAL_COMMUNITY)
Admission: RE | Admit: 2022-08-10 | Discharge: 2022-08-10 | Disposition: A | Discharge status: Home / Self Care | Payer: BC Managed Care – PPO | Source: Ambulatory Visit | Attending: Interventional Radiology | Admitting: Interventional Radiology

## 2022-08-10 ENCOUNTER — Encounter (HOSPITAL_COMMUNITY): Payer: Self-pay | Admitting: Interventional Radiology

## 2022-08-10 DIAGNOSIS — I6521 Occlusion and stenosis of right carotid artery: Secondary | ICD-10-CM | POA: Diagnosis not present

## 2022-08-10 DIAGNOSIS — Z87891 Personal history of nicotine dependence: Secondary | ICD-10-CM | POA: Insufficient documentation

## 2022-08-10 DIAGNOSIS — I2510 Atherosclerotic heart disease of native coronary artery without angina pectoris: Secondary | ICD-10-CM | POA: Diagnosis not present

## 2022-08-10 DIAGNOSIS — I6529 Occlusion and stenosis of unspecified carotid artery: Secondary | ICD-10-CM | POA: Diagnosis present

## 2022-08-10 DIAGNOSIS — Z85810 Personal history of malignant neoplasm of tongue: Secondary | ICD-10-CM | POA: Diagnosis not present

## 2022-08-10 DIAGNOSIS — I10 Essential (primary) hypertension: Secondary | ICD-10-CM | POA: Diagnosis not present

## 2022-08-10 DIAGNOSIS — Z7902 Long term (current) use of antithrombotics/antiplatelets: Secondary | ICD-10-CM | POA: Diagnosis not present

## 2022-08-10 DIAGNOSIS — K219 Gastro-esophageal reflux disease without esophagitis: Secondary | ICD-10-CM | POA: Diagnosis not present

## 2022-08-10 DIAGNOSIS — Z8673 Personal history of transient ischemic attack (TIA), and cerebral infarction without residual deficits: Secondary | ICD-10-CM | POA: Diagnosis not present

## 2022-08-10 DIAGNOSIS — I771 Stricture of artery: Principal | ICD-10-CM

## 2022-08-10 DIAGNOSIS — E039 Hypothyroidism, unspecified: Secondary | ICD-10-CM | POA: Diagnosis not present

## 2022-08-10 DIAGNOSIS — Z8589 Personal history of malignant neoplasm of other organs and systems: Secondary | ICD-10-CM | POA: Insufficient documentation

## 2022-08-10 DIAGNOSIS — Z7982 Long term (current) use of aspirin: Secondary | ICD-10-CM | POA: Insufficient documentation

## 2022-08-10 DIAGNOSIS — Z79899 Other long term (current) drug therapy: Secondary | ICD-10-CM | POA: Diagnosis not present

## 2022-08-10 DIAGNOSIS — I671 Cerebral aneurysm, nonruptured: Secondary | ICD-10-CM | POA: Diagnosis not present

## 2022-08-10 DIAGNOSIS — I779 Disorder of arteries and arterioles, unspecified: Secondary | ICD-10-CM | POA: Diagnosis present

## 2022-08-10 HISTORY — PX: RADIOLOGY WITH ANESTHESIA: SHX6223

## 2022-08-10 HISTORY — PX: IR INTRAVSC STENT CERV CAROTID W/EMB-PROT MOD SED INCL ANGIO: IMG2303

## 2022-08-10 HISTORY — PX: IR CT HEAD LTD: IMG2386

## 2022-08-10 LAB — CBC WITH DIFFERENTIAL/PLATELET
Abs Immature Granulocytes: 0.02 10*3/uL (ref 0.00–0.07)
Basophils Absolute: 0 10*3/uL (ref 0.0–0.1)
Basophils Relative: 1 %
Eosinophils Absolute: 0.3 10*3/uL (ref 0.0–0.5)
Eosinophils Relative: 5 %
HCT: 41.1 % (ref 39.0–52.0)
Hemoglobin: 14.5 g/dL (ref 13.0–17.0)
Immature Granulocytes: 0 %
Lymphocytes Relative: 20 %
Lymphs Abs: 1.2 10*3/uL (ref 0.7–4.0)
MCH: 34 pg (ref 26.0–34.0)
MCHC: 35.3 g/dL (ref 30.0–36.0)
MCV: 96.5 fL (ref 80.0–100.0)
Monocytes Absolute: 0.6 10*3/uL (ref 0.1–1.0)
Monocytes Relative: 10 %
Neutro Abs: 4 10*3/uL (ref 1.7–7.7)
Neutrophils Relative %: 64 %
Platelets: 235 10*3/uL (ref 150–400)
RBC: 4.26 MIL/uL (ref 4.22–5.81)
RDW: 12.6 % (ref 11.5–15.5)
WBC: 6.2 10*3/uL (ref 4.0–10.5)
nRBC: 0 % (ref 0.0–0.2)

## 2022-08-10 LAB — BASIC METABOLIC PANEL
Anion gap: 9 (ref 5–15)
BUN: 15 mg/dL (ref 8–23)
CO2: 25 mmol/L (ref 22–32)
Calcium: 9.3 mg/dL (ref 8.9–10.3)
Chloride: 101 mmol/L (ref 98–111)
Creatinine, Ser: 0.73 mg/dL (ref 0.61–1.24)
GFR, Estimated: >60 mL/min (ref >60)
Glucose, Bld: 106 mg/dL — ABNORMAL HIGH (ref 70–99)
Potassium: 3.7 mmol/L (ref 3.5–5.1)
Sodium: 135 mmol/L (ref 135–145)

## 2022-08-10 LAB — POCT ACTIVATED CLOTTING TIME
Activated Clotting Time: 185 seconds
Activated Clotting Time: 190 seconds

## 2022-08-10 LAB — MRSA NEXT GEN BY PCR, NASAL: MRSA by PCR Next Gen: NOT DETECTED

## 2022-08-10 LAB — PROTIME-INR
INR: 1 (ref 0.8–1.2)
Prothrombin Time: 13.3 seconds (ref 11.4–15.2)

## 2022-08-10 LAB — HEPARIN LEVEL (UNFRACTIONATED): Heparin Unfractionated: <0.1 IU/mL — ABNORMAL LOW (ref 0.30–0.70)

## 2022-08-10 SURGERY — IR WITH ANESTHESIA
Anesthesia: General

## 2022-08-10 MED ORDER — ACETAMINOPHEN 650 MG RE SUPP: 650.0000 mg | RECTAL | Status: DC | PRN

## 2022-08-10 MED ORDER — SODIUM CHLORIDE 0.9 % IV SOLN: INTRAVENOUS | Status: DC

## 2022-08-10 MED ORDER — SUGAMMADEX SODIUM 200 MG/2ML IV SOLN
INTRAVENOUS | Status: DC | PRN
  Administered 2022-08-10: 200 mg via INTRAVENOUS

## 2022-08-10 MED ORDER — HEPARIN (PORCINE) 25000 UT/250ML-% IV SOLN
850.0000 [IU]/h | INTRAVENOUS | Status: DC
  Administered 2022-08-10: 850 [IU]/h via INTRAVENOUS

## 2022-08-10 MED ORDER — ACETAMINOPHEN 325 MG PO TABS: 650.0000 mg | ORAL_TABLET | ORAL | Status: DC | PRN

## 2022-08-10 MED ORDER — CLOPIDOGREL BISULFATE 75 MG PO TABS: 75.0000 mg | ORAL_TABLET | ORAL | Status: DC

## 2022-08-10 MED ORDER — AMITRIPTYLINE HCL 25 MG PO TABS
75.0000 mg | ORAL_TABLET | Freq: Every day | ORAL | Status: DC
  Administered 2022-08-10: 75 mg via ORAL
  Filled 2022-08-10: qty 3

## 2022-08-10 MED ORDER — GABAPENTIN 400 MG PO CAPS
1200.0000 mg | ORAL_CAPSULE | Freq: Every day | ORAL | Status: DC
  Administered 2022-08-10: 1200 mg via ORAL
  Filled 2022-08-10: qty 3

## 2022-08-10 MED ORDER — ASPIRIN 81 MG PO CHEW
81.0000 mg | CHEWABLE_TABLET | Freq: Every day | ORAL | Status: DC
  Filled 2022-08-10: qty 1

## 2022-08-10 MED ORDER — CLOPIDOGREL BISULFATE 75 MG PO TABS
ORAL_TABLET | ORAL | Status: AC
  Filled 2022-08-10: qty 1

## 2022-08-10 MED ORDER — CHLORHEXIDINE GLUCONATE 0.12 % MT SOLN
OROMUCOSAL | Status: AC
  Administered 2022-08-10: 15 mL via OROMUCOSAL
  Filled 2022-08-10: qty 15

## 2022-08-10 MED ORDER — ORAL CARE MOUTH RINSE: 15.0000 mL | Freq: Once | OROMUCOSAL | Status: AC

## 2022-08-10 MED ORDER — ASPIRIN 81 MG PO CHEW
81.0000 mg | CHEWABLE_TABLET | Freq: Every day | ORAL | Status: DC
  Administered 2022-08-11: 81 mg

## 2022-08-10 MED ORDER — CHLORHEXIDINE GLUCONATE CLOTH 2 % EX PADS
6.0000 | MEDICATED_PAD | Freq: Every day | CUTANEOUS | Status: DC
  Administered 2022-08-10: 6 via TOPICAL

## 2022-08-10 MED ORDER — HEPARIN SODIUM (PORCINE) 1000 UNIT/ML IJ SOLN
INTRAMUSCULAR | Status: DC | PRN
  Administered 2022-08-10: 3000 [IU] via INTRAVENOUS
  Administered 2022-08-10: 1000 [IU] via INTRAVENOUS

## 2022-08-10 MED ORDER — CEFAZOLIN SODIUM-DEXTROSE 2-4 GM/100ML-% IV SOLN
INTRAVENOUS | Status: AC
  Filled 2022-08-10: qty 100

## 2022-08-10 MED ORDER — OXYCODONE HCL 5 MG PO TABS: 5.0000 mg | ORAL_TABLET | Freq: Once | ORAL | Status: DC | PRN

## 2022-08-10 MED ORDER — ASPIRIN 325 MG PO TBEC: 325.0000 mg | DELAYED_RELEASE_TABLET | ORAL | Status: DC

## 2022-08-10 MED ORDER — ORAL CARE MOUTH RINSE: 15.0000 mL | OROMUCOSAL | Status: DC | PRN

## 2022-08-10 MED ORDER — PHENYLEPHRINE HCL-NACL 20-0.9 MG/250ML-% IV SOLN
INTRAVENOUS | Status: DC | PRN
  Administered 2022-08-10: 20 ug/min via INTRAVENOUS

## 2022-08-10 MED ORDER — HEPARIN (PORCINE) 25000 UT/250ML-% IV SOLN
INTRAVENOUS | Status: AC
  Administered 2022-08-10: 500 [IU]/h via INTRAVENOUS
  Filled 2022-08-10: qty 250

## 2022-08-10 MED ORDER — CLOPIDOGREL BISULFATE 75 MG PO TABS
75.0000 mg | ORAL_TABLET | Freq: Every day | ORAL | Status: DC
  Administered 2022-08-11: 75 mg via ORAL
  Filled 2022-08-10: qty 1

## 2022-08-10 MED ORDER — CLOPIDOGREL BISULFATE 75 MG PO TABS: 75.0000 mg | ORAL_TABLET | Freq: Every day | ORAL | Status: DC

## 2022-08-10 MED ORDER — GABAPENTIN 400 MG PO CAPS
400.0000 mg | ORAL_CAPSULE | Freq: Two times a day (BID) | ORAL | Status: DC
  Administered 2022-08-11: 400 mg via ORAL
  Filled 2022-08-10: qty 1

## 2022-08-10 MED ORDER — OXYCODONE HCL 5 MG/5ML PO SOLN: 5.0000 mg | Freq: Once | ORAL | Status: DC | PRN

## 2022-08-10 MED ORDER — LIDOCAINE 2% (20 MG/ML) 5 ML SYRINGE
INTRAMUSCULAR | Status: DC | PRN
  Administered 2022-08-10: 100 mg via INTRAVENOUS

## 2022-08-10 MED ORDER — PHENYLEPHRINE 80 MCG/ML (10ML) SYRINGE FOR IV PUSH (FOR BLOOD PRESSURE SUPPORT)
PREFILLED_SYRINGE | INTRAVENOUS | Status: DC | PRN
  Administered 2022-08-10 (×6): 80 ug via INTRAVENOUS

## 2022-08-10 MED ORDER — ONDANSETRON HCL 4 MG/2ML IJ SOLN
INTRAMUSCULAR | Status: DC | PRN
  Administered 2022-08-10: 4 mg via INTRAVENOUS

## 2022-08-10 MED ORDER — EPTIFIBATIDE 20 MG/10ML IV SOLN
INTRAVENOUS | Status: AC
  Filled 2022-08-10: qty 10

## 2022-08-10 MED ORDER — ROCURONIUM BROMIDE 10 MG/ML (PF) SYRINGE
PREFILLED_SYRINGE | INTRAVENOUS | Status: DC | PRN
  Administered 2022-08-10: 60 mg via INTRAVENOUS
  Administered 2022-08-10: 20 mg via INTRAVENOUS
  Administered 2022-08-10: 40 mg via INTRAVENOUS

## 2022-08-10 MED ORDER — DEXAMETHASONE SODIUM PHOSPHATE 10 MG/ML IJ SOLN
INTRAMUSCULAR | Status: DC | PRN
  Administered 2022-08-10: 5 mg via INTRAVENOUS

## 2022-08-10 MED ORDER — CLEVIDIPINE BUTYRATE 0.5 MG/ML IV EMUL
INTRAVENOUS | Status: AC
  Filled 2022-08-10: qty 50

## 2022-08-10 MED ORDER — CLOPIDOGREL BISULFATE 75 MG PO TABS
ORAL_TABLET | ORAL | Status: AC | PRN
  Administered 2022-08-10: 75 mg via ORAL

## 2022-08-10 MED ORDER — CLEVIDIPINE BUTYRATE 0.5 MG/ML IV EMUL
0.0000 mg/h | INTRAVENOUS | Status: AC
  Administered 2022-08-10: 2 mg/h via INTRAVENOUS
  Administered 2022-08-10 (×2): 5 mg/h via INTRAVENOUS
  Filled 2022-08-10: qty 50

## 2022-08-10 MED ORDER — FENTANYL CITRATE (PF) 100 MCG/2ML IJ SOLN: 25.0000 ug | INTRAMUSCULAR | Status: DC | PRN

## 2022-08-10 MED ORDER — HEPARIN (PORCINE) 25000 UT/250ML-% IV SOLN
700.0000 [IU]/h | INTRAVENOUS | Status: DC
  Administered 2022-08-10: 700 [IU]/h via INTRAVENOUS

## 2022-08-10 MED ORDER — LACTATED RINGERS IV SOLN: INTRAVENOUS | Status: DC

## 2022-08-10 MED ORDER — FENTANYL CITRATE (PF) 100 MCG/2ML IJ SOLN
INTRAMUSCULAR | Status: DC | PRN
  Administered 2022-08-10 (×2): 50 ug via INTRAVENOUS

## 2022-08-10 MED ORDER — ALUM & MAG HYDROXIDE-SIMETH 200-200-20 MG/5ML PO SUSP
30.0000 mL | ORAL | Status: DC | PRN
  Administered 2022-08-10: 30 mL via ORAL
  Filled 2022-08-10: qty 30

## 2022-08-10 MED ORDER — HEPARIN (PORCINE) 25000 UT/250ML-% IV SOLN: 500.0000 [IU]/h | INTRAVENOUS | Status: DC

## 2022-08-10 MED ORDER — PROPOFOL 10 MG/ML IV BOLUS
INTRAVENOUS | Status: DC | PRN
  Administered 2022-08-10: 150 mg via INTRAVENOUS

## 2022-08-10 MED ORDER — METOPROLOL TARTRATE 50 MG PO TABS
100.0000 mg | ORAL_TABLET | Freq: Two times a day (BID) | ORAL | Status: DC
  Administered 2022-08-10 – 2022-08-11 (×2): 100 mg via ORAL
  Filled 2022-08-10 (×2): qty 2

## 2022-08-10 MED ORDER — NIMODIPINE 30 MG PO CAPS: 0.0000 mg | ORAL_CAPSULE | ORAL | Status: DC

## 2022-08-10 MED ORDER — LIDOCAINE HCL 1 % IJ SOLN
INTRAMUSCULAR | Status: AC
  Administered 2022-08-10: 10 mL
  Filled 2022-08-10: qty 20

## 2022-08-10 MED ORDER — EPTIFIBATIDE 20 MG/10ML IV SOLN
INTRAVENOUS | Status: AC | PRN
  Administered 2022-08-10 (×3): 2 mg via INTRA_ARTERIAL

## 2022-08-10 MED ORDER — CEFAZOLIN SODIUM-DEXTROSE 2-4 GM/100ML-% IV SOLN
2.0000 g | INTRAVENOUS | Status: AC
  Administered 2022-08-10: 2 g via INTRAVENOUS

## 2022-08-10 MED ORDER — ACETAMINOPHEN 160 MG/5ML PO SOLN: 650.0000 mg | ORAL | Status: DC | PRN

## 2022-08-10 MED ORDER — CHLORHEXIDINE GLUCONATE 0.12 % MT SOLN: 15.0000 mL | Freq: Once | OROMUCOSAL | Status: AC

## 2022-08-10 MED ORDER — IOHEXOL 300 MG/ML  SOLN
150.0000 mL | Freq: Once | INTRAMUSCULAR | Status: AC | PRN
  Administered 2022-08-10: 75 mL via INTRA_ARTERIAL

## 2022-08-10 NOTE — Anesthesia Postprocedure Evaluation (Signed)
Anesthesia Post Note  Patient: Patrick Bailey.  Procedure(s) Performed: Cerebral angioplasty with possible stenting     Patient location during evaluation: PACU Anesthesia Type: General Level of consciousness: awake and alert Pain management: pain level controlled Vital Signs Assessment: post-procedure vital signs reviewed and stable Respiratory status: spontaneous breathing, nonlabored ventilation and respiratory function stable Cardiovascular status: blood pressure returned to baseline Postop Assessment: no apparent nausea or vomiting Anesthetic complications: no   No notable events documented.  Last Vitals:  Vitals:   08/10/22 1115 08/10/22 1130  BP: 127/75 123/81  Pulse: 76 77  Resp: 11 12  Temp:  36.4 C  SpO2: 96% 97%    Last Pain:  Vitals:   08/10/22 1130  TempSrc:   PainSc: 0-No pain                 Marthenia Rolling

## 2022-08-10 NOTE — Progress Notes (Signed)
ANTICOAGULATION CONSULT NOTE - Initial Consult  Pharmacy Consult for heparin Indication: Post-IR  Allergies  Allergen Reactions   Hydrocodone Hives    Patient Measurements: Height: 6' (182.9 cm) Weight: 89.8 kg (198 lb) IBW/kg (Calculated) : 77.6 Heparin Dosing Weight: TBW  Vital Signs: Temp: 98.5 F (36.9 C) (01/08 2000) Temp Source: Oral (01/08 2000) BP: 107/80 (01/08 2000) Pulse Rate: 105 (01/08 2000)  Labs: Recent Labs    08/10/22 0746 08/10/22 2036  HGB 14.5  --   HCT 41.1  --   PLT 235  --   LABPROT 13.3  --   INR 1.0  --   HEPARINUNFRC  --  <0.10*  CREATININE 0.73  --      Estimated Creatinine Clearance: 105.1 mL/min (by C-G formula based on SCr of 0.73 mg/dL).   Medical History: Past Medical History:  Diagnosis Date   Abnormality of gait 07/17/2016   Alcohol use    Arthritis    Cerebellar stroke ([place]) 07/17/2016   Presumed, causing gait abnormality s/p eval by neuro [name]) 07/2016 overall improving.    Coronary artery disease    CPDD (calcium pyrophosphate deposition disease) 10/2013   R hand xray, L knee xray   GERD (gastroesophageal reflux disease)    Gout    History of smoking    HTN (hypertension)    Hypertension    Hypothyroidism (acquired)    Insomnia    Nondisplaced fracture of distal phalanx of right thumb, initial encounter for closed fracture 10/09/2016   Throat cancer ([place]) 11 years ago    Dr. Caryl Pina ([facility])/ 33 radiation treatments/ 2 shots chemo   TOBACCO ABUSE, HX OF 02/05/2009   Quit 2011      Assessment: 81 YOM presenting for carotid stenosis for IR now s/p on heparin gtt at 500 units/hr, he is not on anticoagulation PTA  Heparin level came back undetectable. We will increase and check in AM with the same stop time.  Goal of Therapy:  Heparin level 0.1-0.25 units/ml Monitor platelets by anticoagulation protocol: Yes   Plan:  Increase heparin gtt to 850 units/hr F/u AM hour heparin level  Onnie Boer, PharmD, [place], AAHIVP,  CPP Infectious Disease Pharmacist 08/10/2022 9:04 PM

## 2022-08-10 NOTE — Transfer of Care (Signed)
Immediate Anesthesia Transfer of Care Note  Patient: Patrick Bailey.  Procedure(s) Performed: Cerebral angioplasty with possible stenting  Patient Location: PACU  Anesthesia Type:General  Level of Consciousness: awake, alert , and oriented  Airway & Oxygen Therapy: Patient Spontanous Breathing and Patient connected to nasal cannula oxygen  Post-op Assessment: Report given to RN, Patient moving all extremities X 4, and Patient able to stick tongue midline  Post vital signs: Reviewed and stable  Last Vitals:  Vitals Value Taken Time  BP 130/80 08/10/22 1050  Temp    Pulse 81 08/10/22 1057  Resp 14 08/10/22 1057  SpO2 96 % 08/10/22 1057  Vitals shown include unvalidated device data.  Last Pain:  Vitals:   08/10/22 0747  TempSrc:   PainSc: 0-No pain         Complications: No notable events documented.

## 2022-08-10 NOTE — Progress Notes (Addendum)
ANTICOAGULATION CONSULT NOTE - Initial Consult  Pharmacy Consult for heparin Indication: Post-IR  Allergies  Allergen Reactions   Hydrocodone Hives    Patient Measurements: Height: 6' (182.9 cm) Weight: 89.8 kg (198 lb) IBW/kg (Calculated) : 77.6 Heparin Dosing Weight: TBW  Vital Signs: Temp: 97.6 F (36.4 C) (01/08 1130) Temp Source: Oral (01/08 0625) BP: 123/81 (01/08 1130) Pulse Rate: 77 (01/08 1130)  Labs: Recent Labs    08/10/22 0746  HGB 14.5  HCT 41.1  PLT 235  LABPROT 13.3  INR 1.0  CREATININE 0.73    Estimated Creatinine Clearance: 105.1 mL/min (by C-G formula based on SCr of 0.73 mg/dL).   Medical History: Past Medical History:  Diagnosis Date   Abnormality of gait 07/17/2016   Alcohol use    Arthritis    Cerebellar stroke ([place]) 07/17/2016   Presumed, causing gait abnormality s/p eval by neuro [name]) 07/2016 overall improving.    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 Caryl Pina ([facility])/ 25 radiation treatments/ 2 shots chemo   TOBACCO ABUSE, HX OF 02/05/2009   Quit 2011      Assessment: 58 YOM presenting for carotid stenosis for IR now s/p on heparin gtt at 500 units/hr, he is not on anticoagulation PTA  Goal of Therapy:  Heparin level 0.1-0.25 units/ml Monitor platelets by anticoagulation protocol: Yes   Plan:  Increase heparin gtt to 700 units/hr F/u 6 hour heparin level  Bertis Ruddy, PharmD, [place] Pharmacist ED Pharmacist Phone # [phone] 08/10/2022 11:54 AM

## 2022-08-10 NOTE — Procedures (Signed)
INR.  .Status post right internal carotid artery revascularization with stent assisted angioplasty and distal protection.  Right CFA approach.  Post procedure CT brain no hemorrhagic complications  8 French Angio-Seal closure device deployed for hemostasis at the right groin puncture site.  Distal pulses all intact distally  Patient extubated.  Denies any headaches nausea vomiting or visual or speech difficulties.  Speech and  comprehension normal.  No facial asymmetry.  Tongue midline.  Moves all fours equally. Arlean Hopping MD.

## 2022-08-10 NOTE — Progress Notes (Signed)
Patient seen at bedside s/p  Status post right internal carotid artery revascularization with stent assisted angioplasty and distal protection   Patient seen at bedside with Dr. Luanne Bras No complaints except he states that he is hungry. Specifically denies HA, n/v, chest pain, dyspnea, vision changes, speech difficulties. Patient reporting light sensitivity and headache without tinnitus. Denies n/v, chest pain, dyspnea. Neuro exam unchanged from pre-procedure, no deficits noted. Alert, aware and oriented X 3, Speech clear and comprehension is intact. , No facial droop noted. Can spontaneously move all 4 extremities.Verbal orders given to RN for home medications.    Right CFA puncture site clean, dry, dressed appropriately, no active bleeding, soft, appropriately tender to palpation.   Plan: - NIR will assess patient in AM for potential discharge   Please call NIR  with overnight concerns.

## 2022-08-10 NOTE — Anesthesia Procedure Notes (Signed)
Procedure Name: Intubation Date/Time: 08/10/2022 8:53 AM  Performed by: Barrington Ellison, CRNAPre-anesthesia Checklist: Patient identified, Emergency Drugs available, Suction available and Patient being monitored Patient Re-evaluated:Patient Re-evaluated prior to induction Oxygen Delivery Method: Circle System Utilized Preoxygenation: Pre-oxygenation with 100% oxygen Induction Type: IV induction Ventilation: Mask ventilation without difficulty, Oral airway inserted - appropriate to patient size and Two handed mask ventilation required Laryngoscope Size: Mac and 4 Grade View: Grade I Tube type: Oral Tube size: 7.5 mm Number of attempts: 1 Airway Equipment and Method: Stylet and Oral airway Placement Confirmation: ETT inserted through vocal cords under direct vision, positive ETCO2 and breath sounds checked- equal and bilateral Secured at: 23 cm Tube secured with: Tape Dental Injury: Teeth and Oropharynx as per pre-operative assessment

## 2022-08-10 NOTE — Care Management (Signed)
Patient arrived to unit with L radial arterial line placed in IR. Upon flushing, patient complained of immense pain and heat in L thumb. This RN noticed patients thumb blanching while arterial line was flushed. This RN reached out to PA Zigmund Daniel with MD Deveshwar to inquire about arterial line removal. PA Zigmund Daniel gave the OK to remove arterial line. No LDA was placed for arterial line; This RN removed arterial line at 1500 with pressure held for 59mn.  TDaisey Must RN

## 2022-08-10 NOTE — Progress Notes (Signed)
Patient stated he felt he had another stroke on Sunday 08/09/22.  He stated his left side arm was numb and tingling.

## 2022-08-11 DIAGNOSIS — K219 Gastro-esophageal reflux disease without esophagitis: Secondary | ICD-10-CM | POA: Diagnosis not present

## 2022-08-11 DIAGNOSIS — Z8589 Personal history of malignant neoplasm of other organs and systems: Secondary | ICD-10-CM | POA: Diagnosis not present

## 2022-08-11 DIAGNOSIS — I10 Essential (primary) hypertension: Secondary | ICD-10-CM | POA: Diagnosis not present

## 2022-08-11 DIAGNOSIS — Z85810 Personal history of malignant neoplasm of tongue: Secondary | ICD-10-CM | POA: Diagnosis not present

## 2022-08-11 DIAGNOSIS — E039 Hypothyroidism, unspecified: Secondary | ICD-10-CM | POA: Diagnosis not present

## 2022-08-11 DIAGNOSIS — I2510 Atherosclerotic heart disease of native coronary artery without angina pectoris: Secondary | ICD-10-CM | POA: Diagnosis not present

## 2022-08-11 DIAGNOSIS — I6521 Occlusion and stenosis of right carotid artery: Secondary | ICD-10-CM | POA: Diagnosis not present

## 2022-08-11 DIAGNOSIS — Z8673 Personal history of transient ischemic attack (TIA), and cerebral infarction without residual deficits: Secondary | ICD-10-CM | POA: Diagnosis not present

## 2022-08-11 DIAGNOSIS — Z7902 Long term (current) use of antithrombotics/antiplatelets: Secondary | ICD-10-CM | POA: Diagnosis not present

## 2022-08-11 DIAGNOSIS — Z7982 Long term (current) use of aspirin: Secondary | ICD-10-CM | POA: Diagnosis not present

## 2022-08-11 DIAGNOSIS — Z79899 Other long term (current) drug therapy: Secondary | ICD-10-CM | POA: Diagnosis not present

## 2022-08-11 DIAGNOSIS — Z87891 Personal history of nicotine dependence: Secondary | ICD-10-CM | POA: Diagnosis not present

## 2022-08-11 LAB — CBC WITH DIFFERENTIAL/PLATELET
Abs Immature Granulocytes: 0.03 10*3/uL (ref 0.00–0.07)
Basophils Absolute: 0 10*3/uL (ref 0.0–0.1)
Basophils Relative: 0 %
Eosinophils Absolute: 0.1 10*3/uL (ref 0.0–0.5)
Eosinophils Relative: 1 %
HCT: 35.5 % — ABNORMAL LOW (ref 39.0–52.0)
Hemoglobin: 12.9 g/dL — ABNORMAL LOW (ref 13.0–17.0)
Immature Granulocytes: 0 %
Lymphocytes Relative: 14 %
Lymphs Abs: 1.5 10*3/uL (ref 0.7–4.0)
MCH: 34 pg (ref 26.0–34.0)
MCHC: 36.3 g/dL — ABNORMAL HIGH (ref 30.0–36.0)
MCV: 93.7 fL (ref 80.0–100.0)
Monocytes Absolute: 0.7 10*3/uL (ref 0.1–1.0)
Monocytes Relative: 6 %
Neutro Abs: 8.4 10*3/uL — ABNORMAL HIGH (ref 1.7–7.7)
Neutrophils Relative %: 79 %
Platelets: 234 10*3/uL (ref 150–400)
RBC: 3.79 MIL/uL — ABNORMAL LOW (ref 4.22–5.81)
RDW: 12.2 % (ref 11.5–15.5)
WBC: 10.6 10*3/uL — ABNORMAL HIGH (ref 4.0–10.5)
nRBC: 0 % (ref 0.0–0.2)

## 2022-08-11 LAB — BASIC METABOLIC PANEL
Anion gap: 7 (ref 5–15)
BUN: 15 mg/dL (ref 8–23)
CO2: 24 mmol/L (ref 22–32)
Calcium: 8.9 mg/dL (ref 8.9–10.3)
Chloride: 102 mmol/L (ref 98–111)
Creatinine, Ser: 0.95 mg/dL (ref 0.61–1.24)
GFR, Estimated: >60 mL/min (ref >60)
Glucose, Bld: 128 mg/dL — ABNORMAL HIGH (ref 70–99)
Potassium: 3.9 mmol/L (ref 3.5–5.1)
Sodium: 133 mmol/L — ABNORMAL LOW (ref 135–145)

## 2022-08-11 LAB — HEPARIN LEVEL (UNFRACTIONATED): Heparin Unfractionated: <0.1 IU/mL — ABNORMAL LOW (ref 0.30–0.70)

## 2022-08-11 NOTE — Progress Notes (Signed)
Per Deveshwar MD, BP goal 110-140s

## 2022-08-11 NOTE — Progress Notes (Signed)
  Transition of Care [facility]) Screening Note   Patient Details  Name: Patrick Bailey. Date of Birth: 05-Apr-1961   Transition of [facility]) CM/SW Contact:    Benard Halsted, LCSW Phone Number: 08/11/2022, 10:01 AM    Transition of Care Department [facility]) has reviewed patient and no TOC needs have been identified at this time. We will continue to monitor patient advancement through interdisciplinary progression rounds. If new patient transition needs arise, please place a TOC consult.

## 2022-08-11 NOTE — Discharge Summary (Signed)
Patient ID: Patrick Bailey. MRN: [id number] DOB/AGE: 63-May-1962 63 y.o.  Admit date: 08/10/2022 Discharge date: 08/11/2022  Supervising Physician: Luanne Bras  Patient Status: [facility] - In-pt  Admission Diagnoses: Right internal carotid artery stenosis  Discharge Diagnoses:  Principal Problem:   Internal carotid artery stenosis Active Problems:   Internal carotid artery stenosis, right   Carotid artery disease, unspecified laterality [facility])   Discharged Condition: stable  Hospital Course:  Medical history significant for prior cerebellar stroke, CAD, HTN, depression/anxiety and adenocarcinoma of the appendix. He presented to the [facility] ED 07/01/22 with left-sided numbness and tingling with right leg weakness. Code Stroke was activated. Imaging obtained showed no large vessel occlusion, however extensive soft plaque at the right carotid bifurcation and ICA bulb with irregularity and trailing thrombus at the distal bulb that could be a source of embolic disease.   He was seen by Dr Estanislado Pandy and planned procedure of Rt Internal carotid artery revascularization was scheduled for 08/10/22 Procedure performed in [facility] 08/10/22 with Dr Estanislado Pandy Procedure without complication Admitted to Neuro ICU post procedure for 24 hour observation He did well overnight Denies headache; denies N/V Denies numbness or tingling Denies speech or vision issues. Eating regular diet Passing gas; has urinated  Dr Estanislado Pandy has seen and examined pt He moves all 4s to command Getting up to chair now--- will walk with assistance before discharge  Planned for discharge today  Consults: None  Significant Diagnostic Studies: Cerebral arteriogram  Treatments: Status post right internal carotid artery revascularization with stent assisted angioplasty and distal protection. Right CFA approach. Post procedure CT brain no hemorrhagic complications 8 French Angio-Seal closure device deployed for hemostasis at  the right groin puncture site.  Distal pulses all intact distally  Patient extubated.  Denies any headaches nausea vomiting or visual or speech difficulties.  Speech and  comprehension normal.  No facial asymmetry.  Tongue midline.  Moves all fours equally. Arlean Hopping MD.  Discharge Exam: Blood pressure 117/76, pulse 98, temperature 98 F (36.7 C), temperature source Oral, resp. rate 20, height 6' (1.829 m), weight 198 lb (89.8 kg), SpO2 98 %.  A/O Appropriate Pleasant Smile = Face symmetrical Tongue midline Heart: RRR Lungs CTA Abd soft; NT Extr: FROM Moves all 4 to command Sensation = Strength = Rt groin NT no bleeding; no hematoma; soft  Results for orders placed or performed during the hospital encounter of 08/10/22  MRSA Next Gen by PCR, Nasal   Specimen: Nasal Mucosa; Nasal Swab  Result Value Ref Range   MRSA by PCR Next Gen NOT DETECTED NOT DETECTED  CBC with Differential/Platelet  Result Value Ref Range   WBC 6.2 4.0 - 10.5 K/uL   RBC 4.26 4.22 - 5.81 MIL/uL   Hemoglobin 14.5 13.0 - 17.0 g/dL   HCT 41.1 39.0 - 52.0 %   MCV 96.5 80.0 - 100.0 fL   MCH 34.0 26.0 - 34.0 pg   MCHC 35.3 30.0 - 36.0 g/dL   RDW 12.6 11.5 - 15.5 %   Platelets 235 150 - 400 K/uL   nRBC 0.0 0.0 - 0.2 %   Neutrophils Relative % 64 %   Neutro Abs 4.0 1.7 - 7.7 K/uL   Lymphocytes Relative 20 %   Lymphs Abs 1.2 0.7 - 4.0 K/uL   Monocytes Relative 10 %   Monocytes Absolute 0.6 0.1 - 1.0 K/uL   Eosinophils Relative 5 %   Eosinophils Absolute 0.3 0.0 - 0.5 K/uL   Basophils Relative 1 %  Basophils Absolute 0.0 0.0 - 0.1 K/uL   Immature Granulocytes 0 %   Abs Immature Granulocytes 0.02 0.00 - 0.07 K/uL  Protime-INR  Result Value Ref Range   Prothrombin Time 13.3 11.4 - 15.2 seconds   INR 1.0 0.8 - 1.2  Basic metabolic panel  Result Value Ref Range   Sodium 135 135 - 145 mmol/L   Potassium 3.7 3.5 - 5.1 mmol/L   Chloride 101 98 - 111 mmol/L   CO2 25 22 - 32 mmol/L   Glucose, Bld 106  (H) 70 - 99 mg/dL   BUN 15 8 - 23 mg/dL   Creatinine, Ser 0.73 0.61 - 1.24 mg/dL   Calcium 9.3 8.9 - 10.3 mg/dL   GFR, Estimated >60 >60 mL/min   Anion gap 9 5 - 15  Heparin level (unfractionated)  Result Value Ref Range   Heparin Unfractionated <0.10 (L) 0.30 - 0.70 IU/mL  CBC with Differential/Platelet  Result Value Ref Range   WBC 10.6 (H) 4.0 - 10.5 K/uL   RBC 3.79 (L) 4.22 - 5.81 MIL/uL   Hemoglobin 12.9 (L) 13.0 - 17.0 g/dL   HCT 35.5 (L) 39.0 - 52.0 %   MCV 93.7 80.0 - 100.0 fL   MCH 34.0 26.0 - 34.0 pg   MCHC 36.3 (H) 30.0 - 36.0 g/dL   RDW 12.2 11.5 - 15.5 %   Platelets 234 150 - 400 K/uL   nRBC 0.0 0.0 - 0.2 %   Neutrophils Relative % 79 %   Neutro Abs 8.4 (H) 1.7 - 7.7 K/uL   Lymphocytes Relative 14 %   Lymphs Abs 1.5 0.7 - 4.0 K/uL   Monocytes Relative 6 %   Monocytes Absolute 0.7 0.1 - 1.0 K/uL   Eosinophils Relative 1 %   Eosinophils Absolute 0.1 0.0 - 0.5 K/uL   Basophils Relative 0 %   Basophils Absolute 0.0 0.0 - 0.1 K/uL   Immature Granulocytes 0 %   Abs Immature Granulocytes 0.03 0.00 - 0.07 K/uL  Basic metabolic panel  Result Value Ref Range   Sodium 133 (L) 135 - 145 mmol/L   Potassium 3.9 3.5 - 5.1 mmol/L   Chloride 102 98 - 111 mmol/L   CO2 24 22 - 32 mmol/L   Glucose, Bld 128 (H) 70 - 99 mg/dL   BUN 15 8 - 23 mg/dL   Creatinine, Ser 0.95 0.61 - 1.24 mg/dL   Calcium 8.9 8.9 - 10.3 mg/dL   GFR, Estimated >60 >60 mL/min   Anion gap 7 5 - 15  Heparin level (unfractionated)  Result Value Ref Range   Heparin Unfractionated <0.10 (L) 0.30 - 0.70 IU/mL     Disposition:   Rt Internal carotid artery angioplasty/stent in NIR with Dr Estanislado Pandy 08/10/22 Pt has done well overnight observation Plan for DC to home Dr Estanislado Pandy has seen and examined pt Pt is to continue all home meds Continue ASA 81/Plavix 75 mg daily Follow up appointment with Dr Estanislado Pandy 2-3 weeks--- he will hear form scheduler He has good understanding of plan  Discharge  Instructions     Call MD for:  difficulty breathing, headache or visual disturbances   Complete by: As directed    Call MD for:  extreme fatigue   Complete by: As directed    Call MD for:  hives   Complete by: As directed    Call MD for:  persistant dizziness or light-headedness   Complete by: As directed    Call MD for:  persistant nausea and vomiting   Complete by: As directed    Call MD for:  redness, tenderness, or signs of infection (pain, swelling, redness, odor or green/yellow discharge around incision site)   Complete by: As directed    Call MD for:  severe uncontrolled pain   Complete by: As directed    Call MD for:  temperature >100.4   Complete by: As directed    Diet - low sodium heart healthy   Complete by: As directed    Discharge instructions   Complete by: As directed    DC to home now-- continue all home meds; continue ASA 81 and Plavix 75 mg daily; he will hear from scheduler for time and date of follow up with Dr Estanislado Pandy; call [phone] if any needs or questions   Discharge wound care:   Complete by: As directed    May shower at home today--- keep clean bandaid to Rt groin daily x 7 days   Driving Restrictions   Complete by: As directed    No driving x 1 -2 weeks--- with someone in car   Increase activity slowly   Complete by: As directed    Take it easy 2 weeks   Lifting restrictions   Complete by: As directed    No lifting over 10 lbs x 2 weeks      Allergies as of 08/11/2022       Reactions   Hydrocodone Hives        Medication List     TAKE these medications    amitriptyline 50 MG tablet Commonly known as: ELAVIL Take 1.5 tablets (75 mg total) by mouth at bedtime.   amLODipine 10 MG tablet Commonly known as: NORVASC Take 1 tablet by mouth once daily   aspirin EC 81 MG tablet Take 1 tablet (81 mg total) by mouth daily. Swallow whole.   atorvastatin 40 MG tablet Commonly known as: LIPITOR Take 1 tablet by mouth once daily    clopidogrel 75 MG tablet Commonly known as: PLAVIX Take 1 tablet by mouth once daily   DentaGel 1.1 % Gel dental gel Generic drug: sodium fluoride PLACE 1 APPLICATION ONTO TEETH AT BEDTIME.   famotidine 20 MG tablet Commonly known as: Pepcid Take 1 tablet (20 mg total) by mouth 2 (two) times daily.   fenofibrate 160 MG tablet Take 1 tablet by mouth once daily   Fish Oil 1000 MG Caps Take 2 capsules (2,000 mg total) by mouth 2 (two) times a day. What changed:  how much to take when to take this   gabapentin 400 MG capsule Commonly known as: NEURONTIN Take 1 capsule (400 mg total) by mouth 2 (two) times daily AND 3 capsules (1,200 mg total) at bedtime.   levothyroxine 150 MCG tablet Commonly known as: SYNTHROID Take 1 tablet by mouth once daily What changed: when to take this   metoprolol tartrate 100 MG tablet Commonly known as: LOPRESSOR Take 1 tablet (100 mg total) by mouth 2 (two) times daily.   Mitigare 0.6 MG Caps Generic drug: Colchicine Take 0.6 mg by mouth daily as needed (gout flare). TAKE 1 CAPSULE BY MOUTH IN THE MORNING AS NEEDED What changed: additional instructions               Discharge Care Instructions  (From admission, onward)           Start     Ordered   08/11/22 0000  Discharge wound care:  Comments: May shower at home today--- keep clean bandaid to Rt groin daily x 7 days   08/11/22 0853            Follow-up Information     Luanne Bras, MD Follow up in 2 week(s).   Specialties: Interventional Radiology, Radiology Why: pt will hear from scheduler for time and date of 2-3 weeks follow up with Dr Estanislado Pandy; call [phone] if any questions or concerns; continue home meds Contact information: [address] [place] [address] [place] [address] [phone]                  Electronically Signed: Lavonia Drafts, PA-C 08/11/2022, 8:57 AM   I have spent Greater Than 30 Minutes discharging Patrick Bailey.Marland Kitchen

## 2022-08-12 ENCOUNTER — Other Ambulatory Visit (HOSPITAL_COMMUNITY): Payer: Self-pay | Admitting: Interventional Radiology

## 2022-08-12 ENCOUNTER — Encounter (HOSPITAL_COMMUNITY): Payer: Self-pay | Admitting: Interventional Radiology

## 2022-08-12 DIAGNOSIS — I771 Stricture of artery: Secondary | ICD-10-CM

## 2022-08-12 HISTORY — PX: IR ANGIO INTRA EXTRACRAN SEL COM CAROTID INNOMINATE UNI R MOD SED: IMG5359

## 2022-08-13 DIAGNOSIS — C181 Malignant neoplasm of appendix: Principal | ICD-10-CM

## 2022-08-14 ENCOUNTER — Ambulatory Visit: Admit: 2022-08-14 | Discharge: 2022-08-15 | Payer: PRIVATE HEALTH INSURANCE

## 2022-08-14 DIAGNOSIS — C181 Malignant neoplasm of appendix: Principal | ICD-10-CM

## 2022-08-14 DIAGNOSIS — I639 Cerebral infarction, unspecified: Principal | ICD-10-CM

## 2022-08-14 DIAGNOSIS — C786 Secondary malignant neoplasm of retroperitoneum and peritoneum: Principal | ICD-10-CM

## 2022-08-14 DIAGNOSIS — I10 Essential (primary) hypertension: Secondary | ICD-10-CM | POA: Diagnosis not present

## 2022-08-14 DIAGNOSIS — Z87891 Personal history of nicotine dependence: Secondary | ICD-10-CM | POA: Diagnosis not present

## 2022-08-14 DIAGNOSIS — Z885 Allergy status to narcotic agent status: Secondary | ICD-10-CM | POA: Diagnosis not present

## 2022-08-17 ENCOUNTER — Telehealth (HOSPITAL_COMMUNITY): Payer: Self-pay

## 2022-08-17 NOTE — Telephone Encounter (Signed)
Called to schedule 2 wk f/u, no answer, no vm. AW

## 2022-08-19 ENCOUNTER — Ambulatory Visit: Admit: 2022-08-19 | Discharge: 2022-08-19 | Payer: PRIVATE HEALTH INSURANCE

## 2022-08-19 DIAGNOSIS — C181 Malignant neoplasm of appendix: Principal | ICD-10-CM

## 2022-08-19 DIAGNOSIS — Z452 Encounter for adjustment and management of vascular access device: Secondary | ICD-10-CM | POA: Diagnosis not present

## 2022-08-20 ENCOUNTER — Ambulatory Visit: Admit: 2022-08-20 | Discharge: 2022-08-21 | Payer: PRIVATE HEALTH INSURANCE

## 2022-08-20 ENCOUNTER — Institutional Professional Consult (permissible substitution): Admit: 2022-08-20 | Discharge: 2022-08-21 | Payer: PRIVATE HEALTH INSURANCE

## 2022-08-20 DIAGNOSIS — C786 Secondary malignant neoplasm of retroperitoneum and peritoneum: Principal | ICD-10-CM

## 2022-08-20 DIAGNOSIS — C181 Malignant neoplasm of appendix: Principal | ICD-10-CM

## 2022-08-20 DIAGNOSIS — Z09 Encounter for follow-up examination after completed treatment for conditions other than malignant neoplasm: Principal | ICD-10-CM

## 2022-08-20 DIAGNOSIS — I639 Cerebral infarction, unspecified: Principal | ICD-10-CM

## 2022-08-20 DIAGNOSIS — I10 Essential (primary) hypertension: Secondary | ICD-10-CM | POA: Diagnosis not present

## 2022-08-20 DIAGNOSIS — Z8673 Personal history of transient ischemic attack (TIA), and cerebral infarction without residual deficits: Secondary | ICD-10-CM | POA: Diagnosis not present

## 2022-08-20 DIAGNOSIS — K219 Gastro-esophageal reflux disease without esophagitis: Secondary | ICD-10-CM | POA: Diagnosis not present

## 2022-08-20 DIAGNOSIS — I2510 Atherosclerotic heart disease of native coronary artery without angina pectoris: Secondary | ICD-10-CM | POA: Diagnosis not present

## 2022-08-20 MED ORDER — ONDANSETRON HCL 8 MG TABLET
ORAL_TABLET | Freq: Three times a day (TID) | ORAL | 3 refills | 20 days | Status: CP | PRN
Start: 2022-08-20 — End: ?

## 2022-08-20 MED ORDER — LOPERAMIDE 2 MG TABLET
ORAL_TABLET | 11 refills | 0 days | Status: CP
Start: 2022-08-20 — End: ?

## 2022-08-20 MED ORDER — PROCHLORPERAZINE MALEATE 10 MG TABLET
ORAL_TABLET | Freq: Four times a day (QID) | ORAL | 2 refills | 8 days | Status: CP | PRN
Start: 2022-08-20 — End: ?

## 2022-08-27 ENCOUNTER — Other Ambulatory Visit: Payer: Self-pay | Admitting: Adult Health

## 2022-08-27 ENCOUNTER — Other Ambulatory Visit: Payer: Self-pay | Admitting: Family Medicine

## 2022-08-27 DIAGNOSIS — C181 Malignant neoplasm of appendix: Principal | ICD-10-CM

## 2022-08-27 DIAGNOSIS — F411 Generalized anxiety disorder: Secondary | ICD-10-CM

## 2022-08-27 DIAGNOSIS — G629 Polyneuropathy, unspecified: Secondary | ICD-10-CM

## 2022-08-27 DIAGNOSIS — R200 Anesthesia of skin: Secondary | ICD-10-CM

## 2022-08-28 MED ORDER — OXYCODONE 5 MG TABLET
ORAL_TABLET | Freq: Three times a day (TID) | ORAL | 0 refills | 7 days | PRN
Start: 2022-08-28 — End: ?

## 2022-09-01 ENCOUNTER — Telehealth (HOSPITAL_COMMUNITY): Payer: Self-pay

## 2022-09-01 NOTE — Telephone Encounter (Signed)
Called to schedule f/u. Pt stated he was outside and will call back. AB

## 2022-09-03 ENCOUNTER — Ambulatory Visit: Admit: 2022-09-03 | Discharge: 2022-09-04 | Payer: PRIVATE HEALTH INSURANCE

## 2022-09-03 ENCOUNTER — Institutional Professional Consult (permissible substitution): Admit: 2022-09-03 | Discharge: 2022-09-04 | Payer: PRIVATE HEALTH INSURANCE

## 2022-09-03 DIAGNOSIS — R110 Nausea: Principal | ICD-10-CM

## 2022-09-03 DIAGNOSIS — C181 Malignant neoplasm of appendix: Principal | ICD-10-CM

## 2022-09-03 DIAGNOSIS — C786 Secondary malignant neoplasm of retroperitoneum and peritoneum: Principal | ICD-10-CM

## 2022-09-03 DIAGNOSIS — I639 Cerebral infarction, unspecified: Principal | ICD-10-CM

## 2022-09-03 DIAGNOSIS — Z09 Encounter for follow-up examination after completed treatment for conditions other than malignant neoplasm: Principal | ICD-10-CM

## 2022-09-03 DIAGNOSIS — T451X5A Adverse effect of antineoplastic and immunosuppressive drugs, initial encounter: Principal | ICD-10-CM

## 2022-09-03 DIAGNOSIS — K521 Toxic gastroenteritis and colitis: Principal | ICD-10-CM

## 2022-09-03 DIAGNOSIS — Z5111 Encounter for antineoplastic chemotherapy: Secondary | ICD-10-CM | POA: Diagnosis not present

## 2022-09-03 DIAGNOSIS — Z87891 Personal history of nicotine dependence: Secondary | ICD-10-CM | POA: Diagnosis not present

## 2022-09-03 DIAGNOSIS — Z9049 Acquired absence of other specified parts of digestive tract: Secondary | ICD-10-CM | POA: Diagnosis not present

## 2022-09-03 DIAGNOSIS — I10 Essential (primary) hypertension: Secondary | ICD-10-CM | POA: Diagnosis not present

## 2022-09-03 DIAGNOSIS — R911 Solitary pulmonary nodule: Secondary | ICD-10-CM | POA: Diagnosis not present

## 2022-09-03 DIAGNOSIS — R197 Diarrhea, unspecified: Secondary | ICD-10-CM | POA: Diagnosis not present

## 2022-09-03 DIAGNOSIS — E039 Hypothyroidism, unspecified: Secondary | ICD-10-CM | POA: Diagnosis not present

## 2022-09-03 DIAGNOSIS — I2510 Atherosclerotic heart disease of native coronary artery without angina pectoris: Secondary | ICD-10-CM | POA: Diagnosis not present

## 2022-09-03 DIAGNOSIS — K4090 Unilateral inguinal hernia, without obstruction or gangrene, not specified as recurrent: Secondary | ICD-10-CM | POA: Diagnosis not present

## 2022-09-04 MED ORDER — DIPHENOXYLATE-ATROPINE 2.5 MG-0.025 MG TABLET
ORAL_TABLET | Freq: Four times a day (QID) | ORAL | 0 refills | 8 days | Status: CP
Start: 2022-09-04 — End: ?

## 2022-09-05 ENCOUNTER — Ambulatory Visit: Admit: 2022-09-05 | Discharge: 2022-09-06 | Payer: PRIVATE HEALTH INSURANCE

## 2022-09-08 ENCOUNTER — Ambulatory Visit (HOSPITAL_COMMUNITY)
Admission: RE | Admit: 2022-09-08 | Discharge: 2022-09-08 | Disposition: A | Discharge status: Home / Self Care | Payer: BC Managed Care – PPO | Source: Ambulatory Visit | Attending: Radiology | Admitting: Radiology

## 2022-09-09 MED ORDER — DIPHENOXYLATE-ATROPINE 2.5 MG-0.025 MG TABLET
ORAL_TABLET | Freq: Four times a day (QID) | ORAL | 0 refills | 8 days
Start: 2022-09-09 — End: ?

## 2022-09-11 DIAGNOSIS — C181 Malignant neoplasm of appendix: Principal | ICD-10-CM

## 2022-09-11 MED ORDER — CAPECITABINE 500 MG TABLET
ORAL_TABLET | Freq: Two times a day (BID) | ORAL | 3 refills | 14 days
Start: 2022-09-11 — End: ?

## 2022-09-17 ENCOUNTER — Institutional Professional Consult (permissible substitution): Admit: 2022-09-17 | Discharge: 2022-09-17 | Payer: PRIVATE HEALTH INSURANCE

## 2022-09-17 ENCOUNTER — Ambulatory Visit: Admit: 2022-09-17 | Discharge: 2022-09-17 | Payer: PRIVATE HEALTH INSURANCE

## 2022-09-17 DIAGNOSIS — C181 Malignant neoplasm of appendix: Principal | ICD-10-CM

## 2022-09-17 DIAGNOSIS — C786 Secondary malignant neoplasm of retroperitoneum and peritoneum: Principal | ICD-10-CM

## 2022-09-19 ENCOUNTER — Ambulatory Visit: Admit: 2022-09-19 | Discharge: 2022-09-20 | Payer: PRIVATE HEALTH INSURANCE

## 2022-09-19 DIAGNOSIS — C786 Secondary malignant neoplasm of retroperitoneum and peritoneum: Secondary | ICD-10-CM | POA: Diagnosis not present

## 2022-09-19 DIAGNOSIS — C181 Malignant neoplasm of appendix: Secondary | ICD-10-CM | POA: Diagnosis not present

## 2022-09-21 ENCOUNTER — Ambulatory Visit (HOSPITAL_COMMUNITY)
Admission: RE | Admit: 2022-09-21 | Discharge: 2022-09-21 | Disposition: A | Discharge status: Home / Self Care | Payer: BC Managed Care – PPO | Source: Ambulatory Visit | Attending: Radiology | Admitting: Radiology

## 2022-09-21 DIAGNOSIS — Z9889 Other specified postprocedural states: Secondary | ICD-10-CM | POA: Diagnosis not present

## 2022-09-21 DIAGNOSIS — I6521 Occlusion and stenosis of right carotid artery: Secondary | ICD-10-CM

## 2022-09-22 ENCOUNTER — Other Ambulatory Visit: Payer: Self-pay | Admitting: Family Medicine

## 2022-09-22 HISTORY — PX: IR RADIOLOGIST EVAL & MGMT: IMG5224

## 2022-09-22 NOTE — Telephone Encounter (Signed)
Valtrex Last filled:  05/25/22, #20 Last OV:  03/04/22, penile ulcer Next OV:  none

## 2022-09-22 NOTE — Telephone Encounter (Signed)
ERx 

## 2022-09-24 ENCOUNTER — Encounter: Payer: Self-pay | Admitting: Family Medicine

## 2022-09-28 ENCOUNTER — Other Ambulatory Visit: Payer: Self-pay

## 2022-09-28 ENCOUNTER — Inpatient Hospital Stay (HOSPITAL_COMMUNITY)
Admission: EM | Admit: 2022-09-28 | Discharge: 2022-10-05 | DRG: 389 | Disposition: A | Discharge status: Home / Self Care | Payer: BC Managed Care – PPO | Attending: Internal Medicine | Admitting: Internal Medicine

## 2022-09-28 ENCOUNTER — Emergency Department (HOSPITAL_COMMUNITY): Payer: BC Managed Care – PPO

## 2022-09-28 DIAGNOSIS — C786 Secondary malignant neoplasm of retroperitoneum and peritoneum: Secondary | ICD-10-CM | POA: Diagnosis not present

## 2022-09-28 DIAGNOSIS — I959 Hypotension, unspecified: Secondary | ICD-10-CM | POA: Diagnosis not present

## 2022-09-28 DIAGNOSIS — Z79899 Other long term (current) drug therapy: Secondary | ICD-10-CM

## 2022-09-28 DIAGNOSIS — F1011 Alcohol abuse, in remission: Secondary | ICD-10-CM | POA: Diagnosis not present

## 2022-09-28 DIAGNOSIS — Z4682 Encounter for fitting and adjustment of non-vascular catheter: Secondary | ICD-10-CM | POA: Diagnosis not present

## 2022-09-28 DIAGNOSIS — R1084 Generalized abdominal pain: Secondary | ICD-10-CM | POA: Diagnosis not present

## 2022-09-28 DIAGNOSIS — K56609 Unspecified intestinal obstruction, unspecified as to partial versus complete obstruction: Secondary | ICD-10-CM | POA: Diagnosis not present

## 2022-09-28 DIAGNOSIS — F32A Depression, unspecified: Secondary | ICD-10-CM | POA: Diagnosis present

## 2022-09-28 DIAGNOSIS — R109 Unspecified abdominal pain: Secondary | ICD-10-CM | POA: Diagnosis not present

## 2022-09-28 DIAGNOSIS — E440 Moderate protein-calorie malnutrition: Secondary | ICD-10-CM | POA: Diagnosis not present

## 2022-09-28 DIAGNOSIS — G4700 Insomnia, unspecified: Secondary | ICD-10-CM | POA: Diagnosis present

## 2022-09-28 DIAGNOSIS — Z79890 Hormone replacement therapy (postmenopausal): Secondary | ICD-10-CM

## 2022-09-28 DIAGNOSIS — Z7189 Other specified counseling: Secondary | ICD-10-CM | POA: Diagnosis not present

## 2022-09-28 DIAGNOSIS — Z9049 Acquired absence of other specified parts of digestive tract: Secondary | ICD-10-CM

## 2022-09-28 DIAGNOSIS — N179 Acute kidney failure, unspecified: Secondary | ICD-10-CM | POA: Diagnosis present

## 2022-09-28 DIAGNOSIS — E872 Acidosis, unspecified: Secondary | ICD-10-CM | POA: Diagnosis not present

## 2022-09-28 DIAGNOSIS — C181 Malignant neoplasm of appendix: Secondary | ICD-10-CM | POA: Diagnosis not present

## 2022-09-28 DIAGNOSIS — Z515 Encounter for palliative care: Secondary | ICD-10-CM

## 2022-09-28 DIAGNOSIS — E876 Hypokalemia: Secondary | ICD-10-CM | POA: Diagnosis not present

## 2022-09-28 DIAGNOSIS — R918 Other nonspecific abnormal finding of lung field: Secondary | ICD-10-CM | POA: Diagnosis not present

## 2022-09-28 DIAGNOSIS — E039 Hypothyroidism, unspecified: Secondary | ICD-10-CM | POA: Diagnosis present

## 2022-09-28 DIAGNOSIS — K56600 Partial intestinal obstruction, unspecified as to cause: Secondary | ICD-10-CM | POA: Diagnosis present

## 2022-09-28 DIAGNOSIS — Z7902 Long term (current) use of antithrombotics/antiplatelets: Secondary | ICD-10-CM

## 2022-09-28 DIAGNOSIS — E785 Hyperlipidemia, unspecified: Secondary | ICD-10-CM | POA: Diagnosis present

## 2022-09-28 DIAGNOSIS — Z789 Other specified health status: Secondary | ICD-10-CM | POA: Diagnosis not present

## 2022-09-28 DIAGNOSIS — K529 Noninfective gastroenteritis and colitis, unspecified: Secondary | ICD-10-CM | POA: Diagnosis present

## 2022-09-28 DIAGNOSIS — Z823 Family history of stroke: Secondary | ICD-10-CM

## 2022-09-28 DIAGNOSIS — H539 Unspecified visual disturbance: Secondary | ICD-10-CM | POA: Diagnosis not present

## 2022-09-28 DIAGNOSIS — Z1152 Encounter for screening for COVID-19: Secondary | ICD-10-CM

## 2022-09-28 DIAGNOSIS — E861 Hypovolemia: Secondary | ICD-10-CM | POA: Diagnosis not present

## 2022-09-28 DIAGNOSIS — K567 Ileus, unspecified: Principal | ICD-10-CM | POA: Diagnosis present

## 2022-09-28 DIAGNOSIS — T451X5A Adverse effect of antineoplastic and immunosuppressive drugs, initial encounter: Secondary | ICD-10-CM | POA: Diagnosis present

## 2022-09-28 DIAGNOSIS — I10 Essential (primary) hypertension: Secondary | ICD-10-CM | POA: Diagnosis not present

## 2022-09-28 DIAGNOSIS — Z96653 Presence of artificial knee joint, bilateral: Secondary | ICD-10-CM | POA: Diagnosis present

## 2022-09-28 DIAGNOSIS — Z85028 Personal history of other malignant neoplasm of stomach: Secondary | ICD-10-CM | POA: Diagnosis not present

## 2022-09-28 DIAGNOSIS — R0902 Hypoxemia: Secondary | ICD-10-CM | POA: Diagnosis not present

## 2022-09-28 DIAGNOSIS — F419 Anxiety disorder, unspecified: Secondary | ICD-10-CM | POA: Diagnosis present

## 2022-09-28 DIAGNOSIS — Z85819 Personal history of malignant neoplasm of unspecified site of lip, oral cavity, and pharynx: Secondary | ICD-10-CM

## 2022-09-28 DIAGNOSIS — M109 Gout, unspecified: Secondary | ICD-10-CM | POA: Diagnosis present

## 2022-09-28 DIAGNOSIS — E871 Hypo-osmolality and hyponatremia: Secondary | ICD-10-CM | POA: Diagnosis not present

## 2022-09-28 DIAGNOSIS — C800 Disseminated malignant neoplasm, unspecified: Secondary | ICD-10-CM | POA: Diagnosis not present

## 2022-09-28 DIAGNOSIS — Z885 Allergy status to narcotic agent status: Secondary | ICD-10-CM

## 2022-09-28 DIAGNOSIS — Z452 Encounter for adjustment and management of vascular access device: Secondary | ICD-10-CM | POA: Diagnosis not present

## 2022-09-28 DIAGNOSIS — I2510 Atherosclerotic heart disease of native coronary artery without angina pectoris: Secondary | ICD-10-CM | POA: Diagnosis present

## 2022-09-28 DIAGNOSIS — K219 Gastro-esophageal reflux disease without esophagitis: Secondary | ICD-10-CM

## 2022-09-28 DIAGNOSIS — Z87891 Personal history of nicotine dependence: Secondary | ICD-10-CM

## 2022-09-28 DIAGNOSIS — R6510 Systemic inflammatory response syndrome (SIRS) of non-infectious origin without acute organ dysfunction: Secondary | ICD-10-CM | POA: Diagnosis not present

## 2022-09-28 DIAGNOSIS — N400 Enlarged prostate without lower urinary tract symptoms: Secondary | ICD-10-CM | POA: Diagnosis present

## 2022-09-28 DIAGNOSIS — Z7982 Long term (current) use of aspirin: Secondary | ICD-10-CM

## 2022-09-28 DIAGNOSIS — R140 Abdominal distension (gaseous): Secondary | ICD-10-CM | POA: Diagnosis not present

## 2022-09-28 DIAGNOSIS — I700 Atherosclerosis of aorta: Secondary | ICD-10-CM | POA: Diagnosis not present

## 2022-09-28 DIAGNOSIS — R000 Tachycardia, unspecified: Secondary | ICD-10-CM | POA: Diagnosis not present

## 2022-09-28 DIAGNOSIS — Z6824 Body mass index (BMI) 24.0-24.9, adult: Secondary | ICD-10-CM

## 2022-09-28 DIAGNOSIS — Z711 Person with feared health complaint in whom no diagnosis is made: Secondary | ICD-10-CM | POA: Diagnosis not present

## 2022-09-28 DIAGNOSIS — K5669 Other partial intestinal obstruction: Secondary | ICD-10-CM | POA: Diagnosis not present

## 2022-09-28 DIAGNOSIS — N390 Urinary tract infection, site not specified: Secondary | ICD-10-CM | POA: Diagnosis present

## 2022-09-28 DIAGNOSIS — C169 Malignant neoplasm of stomach, unspecified: Secondary | ICD-10-CM | POA: Diagnosis not present

## 2022-09-28 DIAGNOSIS — Z8249 Family history of ischemic heart disease and other diseases of the circulatory system: Secondary | ICD-10-CM

## 2022-09-28 DIAGNOSIS — Z8673 Personal history of transient ischemic attack (TIA), and cerebral infarction without residual deficits: Secondary | ICD-10-CM

## 2022-09-28 LAB — COMPREHENSIVE METABOLIC PANEL
ALT: 12 U/L (ref 0–44)
AST: 31 U/L (ref 15–41)
Albumin: 2.4 g/dL — ABNORMAL LOW (ref 3.5–5.0)
Alkaline Phosphatase: 50 U/L (ref 38–126)
Anion gap: 16 — ABNORMAL HIGH (ref 5–15)
BUN: 27 mg/dL — ABNORMAL HIGH (ref 8–23)
CO2: 17 mmol/L — ABNORMAL LOW (ref 22–32)
Calcium: 9.1 mg/dL (ref 8.9–10.3)
Chloride: 98 mmol/L (ref 98–111)
Creatinine, Ser: 1.99 mg/dL — ABNORMAL HIGH (ref 0.61–1.24)
GFR, Estimated: 37 mL/min — ABNORMAL LOW (ref >60)
Glucose, Bld: 145 mg/dL — ABNORMAL HIGH (ref 70–99)
Potassium: 3.2 mmol/L — ABNORMAL LOW (ref 3.5–5.1)
Sodium: 131 mmol/L — ABNORMAL LOW (ref 135–145)
Total Bilirubin: 0.9 mg/dL (ref 0.3–1.2)
Total Protein: 6.1 g/dL — ABNORMAL LOW (ref 6.5–8.1)

## 2022-09-28 LAB — I-STAT CHEM 8, ED
BUN: 30 mg/dL — ABNORMAL HIGH (ref 8–23)
Calcium, Ion: 1.17 mmol/L (ref 1.15–1.40)
Chloride: 100 mmol/L (ref 98–111)
Creatinine, Ser: 1.9 mg/dL — ABNORMAL HIGH (ref 0.61–1.24)
Glucose, Bld: 141 mg/dL — ABNORMAL HIGH (ref 70–99)
HCT: 41 % (ref 39.0–52.0)
Hemoglobin: 13.9 g/dL (ref 13.0–17.0)
Potassium: 3.2 mmol/L — ABNORMAL LOW (ref 3.5–5.1)
Sodium: 131 mmol/L — ABNORMAL LOW (ref 135–145)
TCO2: 19 mmol/L — ABNORMAL LOW (ref 22–32)

## 2022-09-28 LAB — LACTIC ACID, PLASMA
Lactic Acid, Venous: 5.2 mmol/L (ref 0.5–1.9)
Lactic Acid, Venous: 3.7 mmol/L (ref 0.5–1.9)
Lactic Acid, Venous: 2 mmol/L (ref 0.5–1.9)
Lactic Acid, Venous: 1.9 mmol/L (ref 0.5–1.9)

## 2022-09-28 LAB — CBC WITH DIFFERENTIAL/PLATELET
Abs Immature Granulocytes: 0.16 10*3/uL — ABNORMAL HIGH (ref 0.00–0.07)
Basophils Absolute: 0 10*3/uL (ref 0.0–0.1)
Basophils Relative: 1 %
Eosinophils Absolute: 0 10*3/uL (ref 0.0–0.5)
Eosinophils Relative: 1 %
HCT: 38.6 % — ABNORMAL LOW (ref 39.0–52.0)
Hemoglobin: 13.7 g/dL (ref 13.0–17.0)
Immature Granulocytes: 3 %
Lymphocytes Relative: 12 %
Lymphs Abs: 0.8 10*3/uL (ref 0.7–4.0)
MCH: 33.1 pg (ref 26.0–34.0)
MCHC: 35.5 g/dL (ref 30.0–36.0)
MCV: 93.2 fL (ref 80.0–100.0)
Monocytes Absolute: 1 10*3/uL (ref 0.1–1.0)
Monocytes Relative: 17 %
Neutro Abs: 4.1 10*3/uL (ref 1.7–7.7)
Neutrophils Relative %: 66 %
Platelets: 258 10*3/uL (ref 150–400)
RBC: 4.14 MIL/uL — ABNORMAL LOW (ref 4.22–5.81)
RDW: 14.2 % (ref 11.5–15.5)
WBC Morphology: INCREASED
WBC: 6.1 10*3/uL (ref 4.0–10.5)
nRBC: 0.3 % — ABNORMAL HIGH (ref 0.0–0.2)

## 2022-09-28 LAB — TYPE AND SCREEN
ABO/RH(D): A POS
Antibody Screen: NEGATIVE

## 2022-09-28 LAB — URINALYSIS, ROUTINE W REFLEX MICROSCOPIC
Glucose, UA: 100 mg/dL — AB
Hgb urine dipstick: NEGATIVE
Ketones, ur: NEGATIVE mg/dL
Leukocytes,Ua: NEGATIVE
Nitrite: POSITIVE — AB
Protein, ur: 100 mg/dL — AB
Specific Gravity, Urine: >1.03 — ABNORMAL HIGH (ref 1.005–1.030)
pH: 5.5 (ref 5.0–8.0)

## 2022-09-28 LAB — URINALYSIS, MICROSCOPIC (REFLEX)

## 2022-09-28 LAB — MAGNESIUM: Magnesium: 1.6 mg/dL — ABNORMAL LOW (ref 1.7–2.4)

## 2022-09-28 LAB — TSH: TSH: 23.597 u[IU]/mL — ABNORMAL HIGH (ref 0.350–4.500)

## 2022-09-28 LAB — T4, FREE: Free T4: 0.88 ng/dL (ref 0.61–1.12)

## 2022-09-28 LAB — ABO/RH: ABO/RH(D): A POS

## 2022-09-28 LAB — LIPASE, BLOOD: Lipase: 24 U/L (ref 11–51)

## 2022-09-28 MED ORDER — ACETAMINOPHEN 650 MG RE SUPP: 650.0000 mg | Freq: Four times a day (QID) | RECTAL | Status: DC | PRN

## 2022-09-28 MED ORDER — FAMOTIDINE 20 MG PO TABS
20.0000 mg | ORAL_TABLET | Freq: Every day | ORAL | Status: DC
  Filled 2022-09-28: qty 1

## 2022-09-28 MED ORDER — LACTATED RINGERS IV BOLUS
500.0000 mL | Freq: Once | INTRAVENOUS | Status: AC
  Administered 2022-09-28: 500 mL via INTRAVENOUS

## 2022-09-28 MED ORDER — THIAMINE MONONITRATE 100 MG PO TABS
100.0000 mg | ORAL_TABLET | Freq: Every day | ORAL | Status: DC
  Filled 2022-09-28: qty 1

## 2022-09-28 MED ORDER — THIAMINE HCL 100 MG/ML IJ SOLN
100.0000 mg | Freq: Every day | INTRAMUSCULAR | Status: DC
  Administered 2022-09-28: 100 mg via INTRAVENOUS
  Filled 2022-09-28: qty 2

## 2022-09-28 MED ORDER — AMITRIPTYLINE HCL 50 MG PO TABS
50.0000 mg | ORAL_TABLET | Freq: Every morning | ORAL | Status: DC
  Filled 2022-09-28: qty 1

## 2022-09-28 MED ORDER — ADULT MULTIVITAMIN W/MINERALS CH
1.0000 | ORAL_TABLET | Freq: Every day | ORAL | Status: DC
  Administered 2022-09-28: 1 via ORAL
  Filled 2022-09-28 (×2): qty 1

## 2022-09-28 MED ORDER — LORAZEPAM 2 MG/ML IJ SOLN: 1.0000 mg | INTRAMUSCULAR | Status: DC | PRN

## 2022-09-28 MED ORDER — ONDANSETRON HCL 4 MG PO TABS: 4.0000 mg | ORAL_TABLET | Freq: Four times a day (QID) | ORAL | Status: DC | PRN

## 2022-09-28 MED ORDER — METOPROLOL TARTRATE 50 MG PO TABS
100.0000 mg | ORAL_TABLET | Freq: Two times a day (BID) | ORAL | Status: DC
  Administered 2022-09-28: 100 mg via ORAL
  Filled 2022-09-28: qty 2

## 2022-09-28 MED ORDER — SODIUM CHLORIDE 0.9 % IV SOLN: INTRAVENOUS | Status: DC

## 2022-09-28 MED ORDER — SODIUM CHLORIDE 0.9% FLUSH
3.0000 mL | Freq: Two times a day (BID) | INTRAVENOUS | Status: DC
  Administered 2022-09-28 – 2022-10-05 (×11): 3 mL via INTRAVENOUS

## 2022-09-28 MED ORDER — MAGNESIUM SULFATE 2 GM/50ML IV SOLN
2.0000 g | Freq: Once | INTRAVENOUS | Status: AC
  Administered 2022-09-28: 2 g via INTRAVENOUS
  Filled 2022-09-28: qty 50

## 2022-09-28 MED ORDER — LORAZEPAM 1 MG PO TABS: 1.0000 mg | ORAL_TABLET | ORAL | Status: DC | PRN

## 2022-09-28 MED ORDER — SODIUM CHLORIDE 0.9 % IV SOLN
1.0000 g | INTRAVENOUS | Status: DC
  Administered 2022-09-28 – 2022-10-02 (×5): 1 g via INTRAVENOUS
  Filled 2022-09-28 (×5): qty 10

## 2022-09-28 MED ORDER — LACTATED RINGERS IV BOLUS
1000.0000 mL | Freq: Once | INTRAVENOUS | Status: AC
  Administered 2022-09-28: 1000 mL via INTRAVENOUS

## 2022-09-28 MED ORDER — LEVOTHYROXINE SODIUM 75 MCG PO TABS
175.0000 ug | ORAL_TABLET | Freq: Every day | ORAL | Status: DC
  Administered 2022-09-29: 175 ug via ORAL
  Filled 2022-09-28: qty 1

## 2022-09-28 MED ORDER — FENTANYL CITRATE PF 50 MCG/ML IJ SOSY
50.0000 ug | PREFILLED_SYRINGE | Freq: Once | INTRAMUSCULAR | Status: AC
  Administered 2022-09-28: 50 ug via INTRAVENOUS
  Filled 2022-09-28: qty 1

## 2022-09-28 MED ORDER — GABAPENTIN 400 MG PO CAPS
400.0000 mg | ORAL_CAPSULE | Freq: Three times a day (TID) | ORAL | Status: DC
  Administered 2022-09-28: 400 mg via ORAL
  Filled 2022-09-28 (×2): qty 1

## 2022-09-28 MED ORDER — POTASSIUM CHLORIDE 10 MEQ/100ML IV SOLN
10.0000 meq | INTRAVENOUS | Status: AC
  Administered 2022-09-28 (×4): 10 meq via INTRAVENOUS
  Filled 2022-09-28 (×4): qty 100

## 2022-09-28 MED ORDER — HEPARIN SODIUM (PORCINE) 5000 UNIT/ML IJ SOLN
5000.0000 [IU] | Freq: Three times a day (TID) | INTRAMUSCULAR | Status: DC
  Administered 2022-09-28 – 2022-10-04 (×20): 5000 [IU] via SUBCUTANEOUS
  Filled 2022-09-28 (×20): qty 1

## 2022-09-28 MED ORDER — ALUM & MAG HYDROXIDE-SIMETH 200-200-20 MG/5ML PO SUSP
30.0000 mL | Freq: Four times a day (QID) | ORAL | Status: DC | PRN
  Administered 2022-09-28: 30 mL via ORAL
  Filled 2022-09-28: qty 30

## 2022-09-28 MED ORDER — FOLIC ACID 1 MG PO TABS
1.0000 mg | ORAL_TABLET | Freq: Every day | ORAL | Status: DC
  Administered 2022-09-28: 1 mg via ORAL
  Filled 2022-09-28 (×2): qty 1

## 2022-09-28 MED ORDER — ONDANSETRON HCL 4 MG/2ML IJ SOLN
4.0000 mg | Freq: Four times a day (QID) | INTRAMUSCULAR | Status: DC | PRN
  Administered 2022-09-28: 4 mg via INTRAVENOUS
  Filled 2022-09-28: qty 2

## 2022-09-28 MED ORDER — FENTANYL CITRATE PF 50 MCG/ML IJ SOSY
50.0000 ug | PREFILLED_SYRINGE | INTRAMUSCULAR | Status: DC | PRN
  Administered 2022-09-28 – 2022-09-29 (×5): 50 ug via INTRAVENOUS
  Filled 2022-09-28 (×5): qty 1

## 2022-09-28 MED ORDER — ATORVASTATIN CALCIUM 40 MG PO TABS
40.0000 mg | ORAL_TABLET | Freq: Every day | ORAL | Status: DC
  Filled 2022-09-28: qty 1

## 2022-09-28 MED ORDER — CLOPIDOGREL BISULFATE 75 MG PO TABS
75.0000 mg | ORAL_TABLET | Freq: Every day | ORAL | Status: DC
  Filled 2022-09-28: qty 1

## 2022-09-28 MED ORDER — ACETAMINOPHEN 325 MG PO TABS: 650.0000 mg | ORAL_TABLET | Freq: Four times a day (QID) | ORAL | Status: DC | PRN

## 2022-09-28 MED ORDER — AMLODIPINE BESYLATE 10 MG PO TABS
10.0000 mg | ORAL_TABLET | Freq: Every day | ORAL | Status: DC
  Filled 2022-09-28: qty 1

## 2022-09-28 MED ORDER — ALBUTEROL SULFATE (2.5 MG/3ML) 0.083% IN NEBU: 2.5000 mg | INHALATION_SOLUTION | Freq: Four times a day (QID) | RESPIRATORY_TRACT | Status: DC | PRN

## 2022-09-28 MED ORDER — FENOFIBRATE 160 MG PO TABS
160.0000 mg | ORAL_TABLET | Freq: Every day | ORAL | Status: DC
  Filled 2022-09-28: qty 1
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
## 2022-09-28 NOTE — ED Notes (Signed)
I&O cath performed by this RN with Tomie China, NT.

## 2022-09-28 NOTE — ED Triage Notes (Signed)
Pt BIB EMS from home. History of stomach cancer, receiving chemo every two weeks. Last wednesday patient had abdominal pain with distention. Diarrhea x1 week. Difficulty urinating x1 day, Initially tachycardiac. Patient reports taking blood pressure medication this morning.   62 systolic pressure initially 500 cc LR

## 2022-09-28 NOTE — ED Provider Notes (Cosign Needed Addendum)
[facility] Provider Note   CSN: [id number] Arrival date & time: 09/28/22  1114     History  Chief Complaint  Patient presents with   Abdominal Pain   abd pain/hypotension    Patrick Bailey. is a 62 y.o. male status post carotid stent, history of right MCA stroke last month, cancer of appendix and oral, presented with abdominal pain has been present for 6 days.  Patient states he woke up this morning to his belly being distended and having pain all throughout.  Patient denied blood thinners. patient endorsed episodes of substernal chest pain.  Patient states he is on chemo and his last treatment was last week.  Abdominal pain does not radiate however patient states abdominal pain is around his belt line all around the anterior aspect of his belly.  Patient stated his abdomen pain makes him short of breath at times.  Patient also endorsed vision changes this morning but denied any recent head trauma.  Patient denied syncope, recent trauma, headache, blurry vision, neck pain, urinary/bowel changes  Patient last ate last night.   Home Medications Prior to Admission medications   Medication Sig Start Date End Date Taking? Authorizing Provider  amitriptyline (ELAVIL) 50 MG tablet TAKE 1 & 1/2 (ONE & ONE-HALF) TABLETS BY MOUTH AT BEDTIME 08/27/22   Eugenia Pancoast, FNP  amLODipine (NORVASC) 10 MG tablet Take 1 tablet by mouth once daily 05/21/22   Ria Bush, MD  aspirin EC 81 MG tablet Take 1 tablet (81 mg total) by mouth daily. Swallow whole. 07/03/22   Ezekiel Slocumb, DO  atorvastatin (LIPITOR) 40 MG tablet Take 1 tablet by mouth once daily Patient taking differently: Take 40 mg by mouth daily. 12/30/21   Ria Bush, MD  clopidogrel (PLAVIX) 75 MG tablet Take 1 tablet by mouth once daily Patient taking differently: Take 75 mg by mouth daily. 05/08/22   Ria Bush, MD  DENTAGEL 1.1 % GEL dental gel PLACE 1 APPLICATION  ONTO TEETH AT BEDTIME. 02/20/22   Ria Bush, MD  famotidine (PEPCID) 20 MG tablet Take 1 tablet (20 mg total) by mouth 2 (two) times daily. 01/06/22   Ria Bush, MD  fenofibrate 160 MG tablet Take 1 tablet by mouth once daily Patient taking differently: Take 160 mg by mouth daily. 03/31/22   Ria Bush, MD  gabapentin (NEURONTIN) 400 MG capsule TAKE 1 CAPSULE BY MOUTH TWICE DAILY AND 3 AT BEDTIME 08/27/22   Frann Rider, NP  levothyroxine (SYNTHROID) 150 MCG tablet Take 1 tablet by mouth once daily Patient taking differently: Take 150 mcg by mouth daily before breakfast. 12/30/21   Ria Bush, MD  metoprolol tartrate (LOPRESSOR) 100 MG tablet Take 1 tablet (100 mg total) by mouth 2 (two) times daily. 01/06/22   Ria Bush, MD  MITIGARE 0.6 MG CAPS Take 0.6 mg by mouth daily as needed (gout flare). TAKE 1 CAPSULE BY MOUTH IN THE MORNING AS NEEDED Patient taking differently: Take 0.6 mg by mouth daily as needed (gout flare). 01/12/20   Enzo Bi, MD  Omega-3 Fatty Acids (FISH OIL) 1000 MG CAPS Take 2 capsules (2,000 mg total) by mouth 2 (two) times a day. Patient taking differently: Take 1 capsule by mouth daily. 01/27/19   Ria Bush, MD  valACYclovir (VALTREX) 1000 MG tablet TAKE 2 TABLETS BY MOUTH TWICE DAILY FOR 1 DAY PER EPISODE 09/22/22   Ria Bush, MD      Allergies    Hydrocodone  Review of Systems   Review of Systems  Gastrointestinal:  Positive for abdominal pain.  See HPI  Physical Exam Updated Vital Signs BP 106/73   Pulse (!) 121   Temp 98.1 F (36.7 C) (Oral)   Resp (!) 22   Ht 6' (1.829 m)   Wt 87.1 kg   SpO2 95%   BMI 26.04 kg/m  Physical Exam Vitals and nursing note reviewed.  Constitutional:      General: He is not in acute distress.    Appearance: He is well-developed.  HENT:     Head: Normocephalic and atraumatic.  Eyes:     Extraocular Movements: Extraocular movements intact.     Conjunctiva/sclera:  Conjunctivae normal.     Pupils: Pupils are equal, round, and reactive to light.  Cardiovascular:     Rate and Rhythm: Regular rhythm. Tachycardia present.     Heart sounds: Normal heart sounds. No murmur heard.    Comments: 1+ bilateral radial/dorsalis pedis pulses with increased rate Pulmonary:     Effort: Pulmonary effort is normal. No respiratory distress.     Breath sounds: Normal breath sounds.  Abdominal:     General: A surgical scar is present. There is distension.     Tenderness: There is generalized abdominal tenderness. There is guarding. There is no rebound.     Hernia: No hernia is present.  Musculoskeletal:        General: No swelling.     Cervical back: Normal range of motion and neck supple.     Right lower leg: No edema.     Left lower leg: No edema.     Comments: 5 out of 5 bilateral grip strength 5 out of 5 bilateral plantarflexion/dorsiflexion Full active range of motion in all joints  Skin:    General: Skin is warm and dry.     Capillary Refill: Capillary refill takes less than 2 seconds.     Comments: No overlying skin color changes  Neurological:     General: No focal deficit present.     Mental Status: He is alert and oriented to person, place, and time.     Comments: Sensation intact in all 4 limbs  Psychiatric:        Mood and Affect: Mood normal.     ED Results / Procedures / Treatments   Labs (all labs ordered are listed, but only abnormal results are displayed) Labs Reviewed  COMPREHENSIVE METABOLIC PANEL - Abnormal; Notable for the following components:      Result Value   Sodium 131 (*)    Potassium 3.2 (*)    CO2 17 (*)    Glucose, Bld 145 (*)    BUN 27 (*)    Creatinine, Ser 1.99 (*)    Total Protein 6.1 (*)    Albumin 2.4 (*)    GFR, Estimated 37 (*)    Anion gap 16 (*)    All other components within normal limits  CBC WITH DIFFERENTIAL/PLATELET - Abnormal; Notable for the following components:   RBC 4.14 (*)    HCT 38.6 (*)     nRBC 0.3 (*)    Abs Immature Granulocytes 0.16 (*)    All other components within normal limits  URINALYSIS, ROUTINE W REFLEX MICROSCOPIC - Abnormal; Notable for the following components:   Specific Gravity, Urine >1.030 (*)    Glucose, UA 100 (*)    Bilirubin Urine MODERATE (*)    Protein, ur 100 (*)    Nitrite POSITIVE (*)  All other components within normal limits  MAGNESIUM - Abnormal; Notable for the following components:   Magnesium 1.6 (*)    All other components within normal limits  TSH - Abnormal; Notable for the following components:   TSH 23.597 (*)    All other components within normal limits  LACTIC ACID, PLASMA - Abnormal; Notable for the following components:   Lactic Acid, Venous 5.2 (*)    All other components within normal limits  URINALYSIS, MICROSCOPIC (REFLEX) - Abnormal; Notable for the following components:   Bacteria, UA FEW (*)    All other components within normal limits  I-STAT CHEM 8, ED - Abnormal; Notable for the following components:   Sodium 131 (*)    Potassium 3.2 (*)    BUN 30 (*)    Creatinine, Ser 1.90 (*)    Glucose, Bld 141 (*)    TCO2 19 (*)    All other components within normal limits  CULTURE, BLOOD (ROUTINE X 2)  CULTURE, BLOOD (ROUTINE X 2)  LIPASE, BLOOD  LACTIC ACID, PLASMA  T3, FREE  T4, FREE  TYPE AND SCREEN  ABO/RH    EKG EKG Interpretation  Date/Time:  Monday September 28 2022 12:13:25 EST Ventricular Rate:  126 PR Interval:  137 QRS Duration: 94 QT Interval:  318 QTC Calculation: 461 R Axis:   23 Text Interpretation: Sinus tachycardia Borderline T abnormalities, inferior leads Confirmed by Godfrey Pick ([id number]) on 09/28/2022 1:56:41 PM  Radiology CT ABDOMEN PELVIS WO CONTRAST  Result Date: 09/28/2022 CLINICAL DATA:  Stomach cancer, abdominal pain and distention, ongoing chemotherapy * Tracking Code: BO * EXAM: CT ABDOMEN AND PELVIS WITHOUT CONTRAST TECHNIQUE: Multidetector CT imaging of the abdomen and pelvis was  performed following the standard protocol without IV contrast. RADIATION DOSE REDUCTION: This exam was performed according to the departmental dose-optimization program which includes automated exposure control, adjustment of the mA and/or kV according to patient size and/or use of iterative reconstruction technique. COMPARISON:  11/14/2021 FINDINGS: Lower chest: No acute abnormality. Hepatobiliary: No solid liver abnormality is seen. Hepatic steatosis. No gallstones, gallbladder wall thickening, or biliary dilatation. Pancreas: Unremarkable. No pancreatic ductal dilatation or surrounding inflammatory changes. Spleen: Normal in size without significant abnormality. Adrenals/Urinary Tract: Adrenal glands are unremarkable. Kidneys are normal, without renal calculi, solid lesion, or hydronephrosis. Bladder is unremarkable. Stomach/Bowel: Stomach is within normal limits. Status post right hemicolectomy and reanastomosis. The mid to distal small bowel is diffusely fluid-filled and mildly distended, largest loops in the low pelvis measuring up to 4.3 cm in caliber (series 5, image 74). The colon is patulous appearing, gas and fluid-filled, measuring up to 8.0 cm in caliber (series 5, image 43). The distal sigmoid colon and rectum are relatively decompressed, without evidence of transition point, with scattered gas and stool present to the rectum. Vascular/Lymphatic: Aortic atherosclerosis. No enlarged abdominal or pelvic lymph nodes. Reproductive: No mass or other significant abnormality. Other: Small, fat containing bilateral inguinal hernias. No ascites. Musculoskeletal: No acute or significant osseous findings. IMPRESSION: 1. Status post right hemicolectomy and reanastomosis. 2. The mid to distal small bowel is diffusely fluid-filled and mildly distended, largest loops in the low pelvis measuring up to 4.3 cm in caliber. The colon is patulous appearing, gas and fluid-filled, measuring up to 8.0 cm in caliber. The  distal sigmoid colon and rectum are relatively decompressed, without evidence of transition point, with scattered gas and stool present to the rectum. Findings are most suggestive of ileus. 3. Patient's known gastric malignancy is  not clearly appreciated by CT. No noncontrast evidence of lymphadenopathy or metastatic disease in the abdomen or pelvis. 4. Hepatic steatosis. Aortic Atherosclerosis (ICD10-I70.0). Electronically Signed   By: Delanna Ahmadi M.D.   On: 09/28/2022 13:39   DG Abd Acute W/Chest  Result Date: 09/28/2022 CLINICAL DATA:  Stomach cancer. Receiving chemotherapy. Distension. EXAM: DG ABDOMEN ACUTE WITH 1 VIEW CHEST COMPARISON:  04/10/2022 FINDINGS: Power port tip in the SVC above the right atrium. Heart size upper limits of normal. The patient has taken a shallow inspiration. Allowing for that, the lungs are clear. No free air seen under the diaphragm.There is gaseous distension of the colon. Lesser dilatation of the small bowel. Findings most consistent with ileus. No free air. IMPRESSION: 1. Gaseous distension of the colon and small bowel most consistent with ileus. No free air. 2. No active cardiopulmonary disease. Shallow inspiration. Electronically Signed   By: Nelson Chimes M.D.   On: 09/28/2022 12:51   CT Head Wo Contrast  Result Date: 09/28/2022 CLINICAL DATA:  Vision changes EXAM: CT HEAD WITHOUT CONTRAST TECHNIQUE: Contiguous axial images were obtained from the base of the skull through the vertex without intravenous contrast. RADIATION DOSE REDUCTION: This exam was performed according to the departmental dose-optimization program which includes automated exposure control, adjustment of the mA and/or kV according to patient size and/or use of iterative reconstruction technique. COMPARISON:  MRI Brain 07/01/22 FINDINGS: Brain: No evidence of acute infarction, hemorrhage, hydrocephalus, extra-axial collection or mass lesion/mass effect. Sequela of mild-to-moderate chronic  microvascular ischemic change. Vascular: No hyperdense vessel or unexpected calcification. Skull: Normal. Negative for fracture or focal lesion. Sinuses/Orbits: No middle ear or mastoid effusion. Paranasal sinuses are clear. Orbits are unremarkable. Other: None. IMPRESSION: 1. No hemorrhage or CT evidence of an acute infarct. 2. Sequela of moderate chronic microvascular ischemic change. Electronically Signed   By: Marin Roberts M.D.   On: 09/28/2022 12:19    Procedures .Critical Care  Performed by: Chuck Hint, PA-C Authorized by: Chuck Hint, PA-C   Critical care provider statement:    Critical care time (minutes):  50   Critical care time was exclusive of:  Separately billable procedures and treating other patients   Critical care was necessary to treat or prevent imminent or life-threatening deterioration of the following conditions:  Renal failure   Critical care was time spent personally by me on the following activities:  Development of treatment plan with patient or surrogate, discussions with consultants, evaluation of patient's response to treatment, examination of patient, obtaining history from patient or surrogate, review of old charts, re-evaluation of patient's condition, pulse oximetry, ordering and review of radiographic studies, ordering and review of laboratory studies and ordering and performing treatments and interventions   I assumed direction of critical care for this patient from another provider in my specialty: no     Care discussed with: admitting provider       Medications Ordered in ED Medications  potassium chloride 10 mEq in 100 mL IVPB (10 mEq Intravenous New Bag/Given 09/28/22 1412)  fentaNYL (SUBLIMAZE) injection 50 mcg (50 mcg Intravenous Given 09/28/22 1150)  lactated ringers bolus 500 mL (0 mLs Intravenous Stopped 09/28/22 1211)  lactated ringers bolus 1,000 mL (0 mLs Intravenous Stopped 09/28/22 1351)  magnesium sulfate IVPB 2 g 50 mL (0 g Intravenous  Stopped 09/28/22 1424)    ED Course/ Medical Decision Making/ A&P  Medical Decision Making Amount and/or Complexity of Data Reviewed Labs: ordered. Radiology: ordered.  Risk Prescription drug management. Decision regarding hospitalization.   [name]. 62 y.o. presented today for abdominal pain. Working DDx that I considered at this time includes, but not limited to, bowel perforation, retroperitoneal bleed, mesenteric ischemia.  Review of prior external notes: 08/11/2022 discharge summary  Unique Tests and My Interpretation:  CT head without contrast: Unremarkable CT abdomen pelvis without contrast: Fluid-filled distended bowel loops, suspicious of ileus DG abdomen acute with chest: Distended colon with bowel suspicious of ileus ABO/Rh: Blood cultures: TSH: Elevated 23.597 Magnesium: 1.6 Lipase: Unremarkable Lactic acid: 5.2 UA: Nitrite positive Type and screen: CBC with differential: Unremarkable CMP: Hypokalemia 3.2, elevated creatinine 1.99, decreased GFR 37 I-STAT Chem-8: Creatinine 1.9, hypokalemia 3.2, increased BUN 30  Discussion with Independent Historian: None  Discussion of Management of Tests: Fuller Plan, MD hospitalist  Risk:  High:  - hospitalization or escalation of hospital-level care  Risk Stratification Score: None  Staffed with Godfrey Pick, MD  R/o DDx: Retroperitoneal bleed: CT negative Bowel perforation: CT negative Mesenteric ischemia: Pain is constant not related to food intake, CT negative  Plan: Patient presented for abdominal pain.  On exam patient was tachycardic with soft blood pressure of 102/87.  EMS stated blood pressure was 63 systolic before receiving fluids.  Patient had distended abdomen and endorsed pain throughout his abdomen.  Abdomen was not fully soft and so an acute abdomen x-ray was ordered to rule out any free air along with a CT abdomen pelvis without contrast to rule out any bleeds  as patient has cancer.  CT head without contrast also ordered as patient endorsed vision changes this morning in which she stated he can see for moments at a time, patient had recent stroke.  No neurodeficit noted and patient had 1+ bilateral pulses in all 4 limbs with increased rate.  Labs came back with patient have an AKI and still waiting CT read however it appears there is air in the intestine with abdominal distention suspicious of SBO.  Patient's last alcoholic drink was 11 days ago and patient states he cannot urinate and so patient will have a cath placed to relieve urinary distention.  Patient will most likely be admitted however general surgery will be made aware pending CT read.  Patient stable at this time however tachycardic 125 and slight tachypneic to 23 breaths/min.  Patient was also given magnesium as his magnesium was slightly depleted at 1.6.  CT came back suspicious of ileus.  Suspect this is from chemo or revascularization last month.  Patient also had extremely elevated TSH of 23 which could be due to chemo or medication related as he is on Synthroid.  Family medicine will be consulted.  After speaking with the hospitalist patient was accepted.  Patient stable for admission at this time.         Final Clinical Impression(s) / ED Diagnoses Final diagnoses:  AKI (acute kidney injury) ([place])  Ileus [facility])    Rx / [place] Orders ED Discharge Orders     None        Elvina Sidle 09/28/22 1446    Godfrey Pick, MD 09/29/22 [phone]
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
## 2022-09-28 NOTE — Consult Note (Signed)
Consultation Note Date: 09/28/2022   Patient Name: Patrick Bailey.  DOB: 1960/09/01  MRN: [id number]  Age / Sex: 62 y.o., male  PCP: Ria Bush, MD Referring Physician: Norval Morton, MD  Reason for Consultation: Establishing goals of care  HPI/Patient Profile: 62 y.o. 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   Patient is being admitted for ileus, AKI, SIRS/lactic acidosis, possible UTI, appendiceal cancer with peritoneal carcinomatosis. PMT has been consulted to assist with goals of care conversation.  Clinical Assessment and Goals of Care:  I have reviewed medical records including EPIC notes, labs and imaging, discussed with RN, assessed the patient and then met at the bedsidee to discuss diagnosis prognosis, GOC, EOL wishes, disposition and options.  I introduced Palliative Medicine as specialized medical care for people living with serious illness. It focuses on providing relief from the symptoms and stress of a serious illness. The goal is to improve quality of life for both the patient and the family.  We discussed a brief life review of the patient and then focused on their current illness.   I attempted to elicit values and goals of care important to the patient.    Medical History Review and Understanding:  Patient has a clear understanding of the severity of his illness.  He states "I am dying from cancer all over my belly."  We discussed his acute illness including AKI, possible UTI, ileus.  Social History: Patient tells me he is well supported by his church members and friends.  His only living relatives are his dad and sister.  He  is divorced and never had children.  Functional and Nutritional State: Patient reports he has not been eating or drinking for the past week due to abdominal pain.  Palliative Symptoms: Abdominal pain  Code Status: Concepts specific to code status, artifical feeding and hydration, and rehospitalization were considered and discussed.  Patient confirms desire for full code.  Discussion: Patient is in intermittent pain during our conversation, having received as needed fentanyl approximately 30 minutes prior to my arrival.  He tells me "I would not be alive if it were not for God."  He has not slept for several days due to his pain and is growing frustrated by n.p.o. status.  He tells me this is his third time getting cancer, the first time being approximately 15 years ago (throat cancer) followed by colon cancer this past May.  He is processing why this is all happening to him, and coping well through his strong faith in God.  He has a good understanding of the terminal nature of his illness, however he expresses his desire to continue with full CODE STATUS and full scope of treatment.  His goal is to continue seeing his oncologists at [facility] and take his chemotherapy as long as he can tolerate it.  He is open to ongoing goals of care conversations pending clinical course and requests to end our conversation due to needing to have a bowel movement.  RN notified.   Discussed the importance of continued conversation with family and the medical providers regarding overall plan of care and treatment options, ensuring decisions are within the context of the patient's values and GOCs.   Questions and concerns were addressed.  Hard Choices booklet left for review. The family  was encouraged to call with questions or concerns.  PMT will continue to support holistically.   SUMMARY OF RECOMMENDATIONS   -Continue full code/full scope treatment -Patient's goal is to improve and continue taking  chemotherapy as long as he can tolerate it -Psychosocial and emotional support provided -Ongoing goals of care discussions pending clinical course -PMT will continue to follow and support holistically   Prognosis:  < 6 months  Discharge Planning: To Be Determined      Primary Diagnoses: Present on Admission:  Ile[facility])   Physical Exam Vitals and nursing note reviewed.  Constitutional:      General: He is not in acute distress.    Appearance: He is ill-appearing.  Cardiovascular:     Rate and Rhythm: Tachycardia present.  Pulmonary:     Effort: Pulmonary effort is normal. No respiratory distress.  Abdominal:     General: There is distension.  Skin:    General: Skin is warm and dry.  Neurological:     Mental Status: He is alert and oriented to person, place, and time.  Psychiatric:        Mood and Affect: Mood normal.        Behavior: Behavior normal.    Vital Signs: BP 106/73   Pulse (!) 121   Temp 98.6 F (37 C) (Oral)   Resp (!) 22   Ht 6' (1.829 m)   Wt 87.1 kg   SpO2 95%   BMI 26.04 kg/m  Pain Scale: 0-10   Pain Score: 10-Worst pain ever   SpO2: SpO2: 95 % O2 Device:SpO2: 95 % O2 Flow Rate: .    Palliative Assessment/Data: TBD     MDM: High   Glenn Christo Johnnette Litter, PA-C  Palliative Medicine Team Team phone # [phone]  Thank you for allowing the Palliative Medicine Team to assist in the care of this patient. Please utilize secure chat with additional questions, if there is no response within 30 minutes please call the above phone number.  Palliative Medicine Team providers are available by phone from 7am to 7pm daily and can be reached through the team cell phone.  Should this patient require assistance outside of these hours, please call the patient's attending physician.

## 2022-09-28 NOTE — Progress Notes (Signed)
Pt. Arrived to unit alert and oriented x4, no c/o pain Heart Rate in upper 120's yellow [place] applied followed MD notified

## 2022-09-28 NOTE — ED Notes (Signed)
Patient returned from [place] with this RN

## 2022-09-28 NOTE — ED Notes (Signed)
Patient transported to CT on monitor with this RN

## 2022-09-28 NOTE — Progress Notes (Signed)
Pt. Angry that he has an NPO order, " states he is dying we would we deny him food, on last days" also state  ":will leave AMA if not allowed to eat" MD notified

## 2022-09-29 ENCOUNTER — Inpatient Hospital Stay (HOSPITAL_COMMUNITY): Payer: BC Managed Care – PPO

## 2022-09-29 ENCOUNTER — Encounter (HOSPITAL_COMMUNITY): Payer: Self-pay | Admitting: Internal Medicine

## 2022-09-29 DIAGNOSIS — K567 Ileus, unspecified: Secondary | ICD-10-CM | POA: Diagnosis not present

## 2022-09-29 DIAGNOSIS — K56609 Unspecified intestinal obstruction, unspecified as to partial versus complete obstruction: Secondary | ICD-10-CM

## 2022-09-29 DIAGNOSIS — Z7189 Other specified counseling: Secondary | ICD-10-CM | POA: Diagnosis not present

## 2022-09-29 DIAGNOSIS — N179 Acute kidney failure, unspecified: Secondary | ICD-10-CM | POA: Diagnosis not present

## 2022-09-29 LAB — BASIC METABOLIC PANEL
Anion gap: 12 (ref 5–15)
BUN: 25 mg/dL — ABNORMAL HIGH (ref 8–23)
CO2: 20 mmol/L — ABNORMAL LOW (ref 22–32)
Calcium: 8.2 mg/dL — ABNORMAL LOW (ref 8.9–10.3)
Chloride: 97 mmol/L — ABNORMAL LOW (ref 98–111)
Creatinine, Ser: 0.92 mg/dL (ref 0.61–1.24)
GFR, Estimated: >60 mL/min (ref >60)
Glucose, Bld: 127 mg/dL — ABNORMAL HIGH (ref 70–99)
Potassium: 3 mmol/L — ABNORMAL LOW (ref 3.5–5.1)
Sodium: 129 mmol/L — ABNORMAL LOW (ref 135–145)

## 2022-09-29 LAB — MAGNESIUM: Magnesium: 2 mg/dL (ref 1.7–2.4)

## 2022-09-29 LAB — CBC
HCT: 33.2 % — ABNORMAL LOW (ref 39.0–52.0)
Hemoglobin: 12.1 g/dL — ABNORMAL LOW (ref 13.0–17.0)
MCH: 33 pg (ref 26.0–34.0)
MCHC: 36.4 g/dL — ABNORMAL HIGH (ref 30.0–36.0)
MCV: 90.5 fL (ref 80.0–100.0)
Platelets: 234 10*3/uL (ref 150–400)
RBC: 3.67 MIL/uL — ABNORMAL LOW (ref 4.22–5.81)
RDW: 14.1 % (ref 11.5–15.5)
WBC: 5.8 10*3/uL (ref 4.0–10.5)
nRBC: 0 % (ref 0.0–0.2)

## 2022-09-29 LAB — T3, FREE: T3, Free: 1.4 pg/mL — ABNORMAL LOW (ref 2.0–4.4)

## 2022-09-29 MED ORDER — THIAMINE MONONITRATE 100 MG PO TABS
100.0000 mg | ORAL_TABLET | Freq: Every day | ORAL | Status: DC
  Administered 2022-09-30 – 2022-10-02 (×3): 100 mg
  Filled 2022-09-29 (×3): qty 1

## 2022-09-29 MED ORDER — FAMOTIDINE 20 MG PO TABS
20.0000 mg | ORAL_TABLET | Freq: Every day | ORAL | Status: DC
  Administered 2022-09-30 – 2022-10-02 (×3): 20 mg
  Filled 2022-09-29 (×3): qty 1

## 2022-09-29 MED ORDER — LORAZEPAM 1 MG PO TABS: 1.0000 mg | ORAL_TABLET | ORAL | Status: AC | PRN

## 2022-09-29 MED ORDER — THIAMINE HCL 100 MG/ML IJ SOLN: 100.0000 mg | Freq: Every day | INTRAMUSCULAR | Status: DC

## 2022-09-29 MED ORDER — POTASSIUM CHLORIDE CRYS ER 20 MEQ PO TBCR: 40.0000 meq | EXTENDED_RELEASE_TABLET | Freq: Two times a day (BID) | ORAL | Status: DC

## 2022-09-29 MED ORDER — LORAZEPAM 2 MG/ML IJ SOLN: 1.0000 mg | INTRAMUSCULAR | Status: AC | PRN

## 2022-09-29 MED ORDER — ADULT MULTIVITAMIN W/MINERALS CH
1.0000 | ORAL_TABLET | Freq: Every day | ORAL | Status: DC
  Administered 2022-09-30 – 2022-10-02 (×3): 1
  Filled 2022-09-29 (×3): qty 1

## 2022-09-29 MED ORDER — ALUM & MAG HYDROXIDE-SIMETH 200-200-20 MG/5ML PO SUSP
30.0000 mL | Freq: Four times a day (QID) | ORAL | Status: DC | PRN
  Administered 2022-10-02 – 2022-10-05 (×3): 30 mL
  Filled 2022-09-29 (×5): qty 30

## 2022-09-29 MED ORDER — LEVOTHYROXINE SODIUM 75 MCG PO TABS
175.0000 ug | ORAL_TABLET | Freq: Every day | ORAL | Status: DC
  Administered 2022-09-30 – 2022-10-05 (×6): 175 ug
  Filled 2022-09-29 (×6): qty 1

## 2022-09-29 MED ORDER — AMLODIPINE BESYLATE 10 MG PO TABS
10.0000 mg | ORAL_TABLET | Freq: Every day | ORAL | Status: DC
  Administered 2022-09-30 – 2022-10-05 (×6): 10 mg
  Filled 2022-09-29 (×6): qty 1

## 2022-09-29 MED ORDER — FOLIC ACID 1 MG PO TABS
1.0000 mg | ORAL_TABLET | Freq: Every day | ORAL | Status: DC
  Administered 2022-09-30 – 2022-10-02 (×3): 1 mg
  Filled 2022-09-29 (×3): qty 1

## 2022-09-29 MED ORDER — AMITRIPTYLINE HCL 50 MG PO TABS
50.0000 mg | ORAL_TABLET | Freq: Every morning | ORAL | Status: DC
  Administered 2022-09-30 – 2022-10-05 (×6): 50 mg
  Filled 2022-09-29 (×7): qty 1

## 2022-09-29 MED ORDER — FENOFIBRATE 160 MG PO TABS
160.0000 mg | ORAL_TABLET | Freq: Every day | ORAL | Status: DC
  Administered 2022-09-30 – 2022-10-05 (×6): 160 mg
  Filled 2022-09-29 (×6): qty 1

## 2022-09-29 MED ORDER — ONDANSETRON HCL 4 MG PO TABS: 4.0000 mg | ORAL_TABLET | Freq: Four times a day (QID) | ORAL | Status: DC | PRN

## 2022-09-29 MED ORDER — GABAPENTIN 400 MG PO CAPS
400.0000 mg | ORAL_CAPSULE | Freq: Three times a day (TID) | ORAL | Status: DC
  Administered 2022-09-29 – 2022-10-05 (×18): 400 mg
  Filled 2022-09-29 (×18): qty 1

## 2022-09-29 MED ORDER — PHENOL 1.4 % MT LIQD
1.0000 | OROMUCOSAL | Status: DC | PRN
  Filled 2022-09-29: qty 177

## 2022-09-29 MED ORDER — MAGNESIUM OXIDE -MG SUPPLEMENT 400 (240 MG) MG PO TABS: 400.0000 mg | ORAL_TABLET | Freq: Two times a day (BID) | ORAL | Status: DC

## 2022-09-29 MED ORDER — KCL IN DEXTROSE-NACL 40-5-0.45 MEQ/L-%-% IV SOLN
INTRAVENOUS | Status: DC
  Filled 2022-09-29 (×3): qty 1000

## 2022-09-29 MED ORDER — ACETAMINOPHEN 650 MG RE SUPP: 650.0000 mg | Freq: Four times a day (QID) | RECTAL | Status: DC | PRN

## 2022-09-29 MED ORDER — POTASSIUM CHLORIDE 2 MEQ/ML IV SOLN: INTRAVENOUS | Status: DC

## 2022-09-29 MED ORDER — ATORVASTATIN CALCIUM 40 MG PO TABS
40.0000 mg | ORAL_TABLET | Freq: Every day | ORAL | Status: DC
  Administered 2022-09-30 – 2022-10-05 (×6): 40 mg
  Filled 2022-09-29 (×6): qty 1

## 2022-09-29 MED ORDER — HYDROMORPHONE HCL 1 MG/ML IJ SOLN
1.0000 mg | INTRAMUSCULAR | Status: DC | PRN
  Administered 2022-09-29 – 2022-10-01 (×11): 2 mg via INTRAVENOUS
  Administered 2022-10-01 – 2022-10-02 (×3): 1 mg via INTRAVENOUS
  Administered 2022-10-02 – 2022-10-05 (×13): 2 mg via INTRAVENOUS
  Administered 2022-10-05: 1 mg via INTRAVENOUS
  Filled 2022-09-29 (×7): qty 2
  Filled 2022-09-29: qty 1
  Filled 2022-09-29 (×13): qty 2
  Filled 2022-09-29: qty 1
  Filled 2022-09-29 (×4): qty 2
  Filled 2022-09-29: qty 1
  Filled 2022-09-29 (×2): qty 2

## 2022-09-29 MED ORDER — METOPROLOL TARTRATE 5 MG/5ML IV SOLN
5.0000 mg | Freq: Four times a day (QID) | INTRAVENOUS | Status: DC
  Administered 2022-09-29 – 2022-10-05 (×24): 5 mg via INTRAVENOUS
  Filled 2022-09-29 (×23): qty 5

## 2022-09-29 MED ORDER — CLOPIDOGREL BISULFATE 75 MG PO TABS
75.0000 mg | ORAL_TABLET | Freq: Every day | ORAL | Status: DC
  Administered 2022-09-30 – 2022-10-05 (×6): 75 mg
  Filled 2022-09-29 (×6): qty 1

## 2022-09-29 MED ORDER — ONDANSETRON HCL 4 MG/2ML IJ SOLN
4.0000 mg | Freq: Four times a day (QID) | INTRAMUSCULAR | Status: DC | PRN
  Administered 2022-09-29 – 2022-10-04 (×7): 4 mg via INTRAVENOUS
  Filled 2022-09-29 (×7): qty 2

## 2022-09-29 MED ORDER — ACETAMINOPHEN 325 MG PO TABS: 650.0000 mg | ORAL_TABLET | Freq: Four times a day (QID) | ORAL | Status: DC | PRN

## 2022-09-29 MED ORDER — DIATRIZOATE MEGLUMINE & SODIUM 66-10 % PO SOLN
90.0000 mL | Freq: Once | ORAL | Status: AC
  Administered 2022-09-29: 90 mL via NASOGASTRIC
  Filled 2022-09-29: qty 90

## 2022-09-29 NOTE — Progress Notes (Signed)
Initial Nutrition Assessment  DOCUMENTATION CODES:   Non-severe (moderate) malnutrition in context of chronic illness  INTERVENTION:  RD to monitor for diet advancement, will order nutrition supplements as appropriate Consider alternate nutrition access if unable to advance diet and as aligns with GOC Pt is at high refeeding risk, once able to receive nutrition, recommend monitoring magnesium, potassium and phosphorus BID x3 days   NUTRITION DIAGNOSIS:   Moderate Malnutrition related to chronic illness (appendiceal and peritoneal carcinamatosis) as evidenced by moderate fat depletion, severe muscle depletion, moderate muscle depletion.  GOAL:   Patient will meet greater than or equal to 90% of their needs  MONITOR:   Labs, Weight trends, Diet advancement, I & O's  REASON FOR ASSESSMENT:   Malnutrition Screening Tool    ASSESSMENT:   Pt admitted form home with abdominal pain and hypotension. Found to have ileus upon admission. 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 Now with recurrent appendiceal and peritoneal carcinomatosis (08/2022) on chemo.  Pt noted to have been placed on a diet yesterday despite recommendation for bowel rest. Continues with abdominal pain today. Now NPO with NGT to LIS.   PMT assessed yesterday. Although pt noted to have poor prognosis, he wishes to continue with full scope of care.   Upon entering room, pt is very frustrated d/t NGT placement and malfunction and inability to eat anything; therefore nutrition related history is limited. Pt reports eating well up until Tuesday which is when he experienced onset of current symptoms. Since that time, it appears per review of chart, that pt has not been eating or drinking within this last week.   Reviewed weight history. Pt's weight noted to have declined 10.7% within the last year which is not  clinically significant for time frame  Medications: pepcid, fenofibrate, folvite, MVI (once able to take PO), thiamine IV drips: abx, D5 and NaCl with KCl '@100ml'$ /hr  Labs: sodium 129, potassium 3.0, BUN 25  NGT to LIS: 2L x10 hours  NUTRITION - FOCUSED PHYSICAL EXAM:  Flowsheet Row Most Recent Value  Orbital Region Moderate depletion  Upper Arm Region Severe depletion  Thoracic and Lumbar Region No depletion  Buccal Region Moderate depletion  Temple Region Moderate depletion  Clavicle Bone Region Moderate depletion  Clavicle and Acromion Bone Region Mild depletion  Scapular Bone Region Mild depletion  Dorsal Hand Mild depletion  Patellar Region Severe depletion  Anterior Thigh Region Severe depletion  Posterior Calf Region Moderate depletion  Edema (RD Assessment) None  Hair Reviewed  Eyes Reviewed  Mouth Reviewed  Skin Reviewed  Nails Reviewed       Diet Order:   Diet Order             Diet NPO time specified  Diet effective now                   EDUCATION NEEDS:   Education needs have been addressed  Skin:  Skin Assessment: Reviewed RN Assessment  Last BM:  2/26 (distended, taut)  Height:   Ht Readings from Last 1 Encounters:  09/28/22 6' (1.829 m)    Weight:   Wt Readings from Last 1 Encounters:  09/29/22 86 kg   BMI:  Body mass index is 25.71 kg/m.  Estimated Nutritional Needs:   Kcal:  [id number]  Protein:  115-130g  Fluid:  >/=2L  Clayborne Dana, RDN, LDN Clinical Nutrition

## 2022-09-29 NOTE — Progress Notes (Signed)
[facility] PROGRESS NOTE    Progress Note  Patrick Bailey.  [id number] DOB: [birth year] DOA: 09/28/2022 PCP: Ria Bush, MD     Brief Narrative:   Patrick Bailey. is an 62 y.o. male past medical history significant for essential hypertension, right MCA stroke, anxiety and depression head and neck, status post right hemicolectomy in 2023, recently diagnosed with peritoneal carcinomatosis on 1/24 on chemotherapy comes in complaining of abdominal pain that started 1 week prior to admission due to chemotherapy has been having chronic diarrhea he is on FOLFOX started on January this year.  CT scan of the abdomen pelvis showed mid to distal bowel mildly distended relatively decompressed colon and rectum without evidence of transition point there is scattered stool and gas present concerning for ileus    Assessment/Plan:   Abdominal pain: There is pain medication is not strong enough, will increase to IV Dilaudid. Place him n.p.o. CT scan of the abdomen pelvis showed mildly distended small bowel the colon is fluid-filled measuring about a centimeter in caliber and relative decompressed concerning for ileus. The differential would include ileus versus peritoneal carcinomatosis.  Ileus (Funk) CT scan finding for ileus.  The patient was passing gas and had a bowel movement last night. Patient fluid resuscitated in the ED.  Was given a diet yesterday but continues to have abdominal pain. Placed him n.p.o., increase narcotics as he is having ongoing abdominal pain.  SIRS/lactic acidosis: Likely due to decreased oral intake this resolved with resuscitation.  Possible UTI: Urine cultures were sent was started on IV Rocephin.  Acute kidney injury: With a baseline creatinine of less than 1 on admission to started on fluid resuscitation, is now back to baseline. Likely prerenal azotemia.  Peritoneal carcinomatosis of the appendix: On chemotherapy last 1 on  08/20/2022. Has a follow-up at [facility]. Palliative care was consulted he has a very poor prognosis. Increase narcotics.  Hypokalemia/hypomagnesemia: Continue replete IV recheck in the morning.  Essential hypertension: Continue metoprolol and amlodipine.  Sinus tachycardia: Likely due to pain start IV metoprolol every 6 hours patient is now NPO.  Hypothyroidism: With a TSH of 23 Free T40.8, his thyroxine was increased 275 patient educated on taking it on empty stomach.  History of alcohol abuse: Denies any alcohol consumption.    DVT prophylaxis: lovenox Family Communication:none Status is: Inpatient Remains inpatient appropriate because: Abdominal pain/ileus    Code Status:     Code Status Orders  (From admission, onward)           Start     Ordered   09/28/22 1454  Full code  Continuous       Question:  By:  Answer:  Consent: discussion documented in EHR   09/28/22 1457           Code Status History     Date Active Date Inactive Code Status Order ID Comments User Context   07/20/2022 0945 07/21/2022 0509 Full Code [id number]  Luanne Bras, MD HOV   07/01/2022 1312 07/02/2022 1439 Full Code [id number]  Cox, Amy N, DO ED   11/15/2021 0149 11/25/2021 2012 Full Code [id number]  Georganna Skeans, MD ED   01/11/2020 1947 01/12/2020 2034 Full Code [id number]  Clance Boll, MD ED   01/31/2019 1319 02/01/2019 1432 Full Code [id number]  Prudencio Burly III, PA-C Inpatient   08/24/2017 1727 08/25/2017 1514 Full Code [id number]  Sindy Messing, PA-C Inpatient         IV  Access:   Peripheral IV   Procedures and diagnostic studies:   CT ABDOMEN PELVIS WO CONTRAST  Result Date: 09/28/2022 CLINICAL DATA:  Stomach cancer, abdominal pain and distention, ongoing chemotherapy * Tracking Code: BO * EXAM: CT ABDOMEN AND PELVIS WITHOUT CONTRAST TECHNIQUE: Multidetector CT imaging of the abdomen and pelvis was performed following the standard  protocol without IV contrast. RADIATION DOSE REDUCTION: This exam was performed according to the departmental dose-optimization program which includes automated exposure control, adjustment of the mA and/or kV according to patient size and/or use of iterative reconstruction technique. COMPARISON:  11/14/2021 FINDINGS: Lower chest: No acute abnormality. Hepatobiliary: No solid liver abnormality is seen. Hepatic steatosis. No gallstones, gallbladder wall thickening, or biliary dilatation. Pancreas: Unremarkable. No pancreatic ductal dilatation or surrounding inflammatory changes. Spleen: Normal in size without significant abnormality. Adrenals/Urinary Tract: Adrenal glands are unremarkable. Kidneys are normal, without renal calculi, solid lesion, or hydronephrosis. Bladder is unremarkable. Stomach/Bowel: Stomach is within normal limits. Status post right hemicolectomy and reanastomosis. The mid to distal small bowel is diffusely fluid-filled and mildly distended, largest loops in the low pelvis measuring up to 4.3 cm in caliber (series 5, image 74). The colon is patulous appearing, gas and fluid-filled, measuring up to 8.0 cm in caliber (series 5, image 43). The distal sigmoid colon and rectum are relatively decompressed, without evidence of transition point, with scattered gas and stool present to the rectum. Vascular/Lymphatic: Aortic atherosclerosis. No enlarged abdominal or pelvic lymph nodes. Reproductive: No mass or other significant abnormality. Other: Small, fat containing bilateral inguinal hernias. No ascites. Musculoskeletal: No acute or significant osseous findings. IMPRESSION: 1. Status post right hemicolectomy and reanastomosis. 2. The mid to distal small bowel is diffusely fluid-filled and mildly distended, largest loops in the low pelvis measuring up to 4.3 cm in caliber. The colon is patulous appearing, gas and fluid-filled, measuring up to 8.0 cm in caliber. The distal sigmoid colon and rectum are  relatively decompressed, without evidence of transition point, with scattered gas and stool present to the rectum. Findings are most suggestive of ileus. 3. Patient's known gastric malignancy is not clearly appreciated by CT. No noncontrast evidence of lymphadenopathy or metastatic disease in the abdomen or pelvis. 4. Hepatic steatosis. Aortic Atherosclerosis (ICD10-I70.0). Electronically Signed   By: Delanna Ahmadi M.D.   On: 09/28/2022 13:39   DG Abd Acute W/Chest  Result Date: 09/28/2022 CLINICAL DATA:  Stomach cancer. Receiving chemotherapy. Distension. EXAM: DG ABDOMEN ACUTE WITH 1 VIEW CHEST COMPARISON:  04/10/2022 FINDINGS: Power port tip in the SVC above the right atrium. Heart size upper limits of normal. The patient has taken a shallow inspiration. Allowing for that, the lungs are clear. No free air seen under the diaphragm.There is gaseous distension of the colon. Lesser dilatation of the small bowel. Findings most consistent with ileus. No free air. IMPRESSION: 1. Gaseous distension of the colon and small bowel most consistent with ileus. No free air. 2. No active cardiopulmonary disease. Shallow inspiration. Electronically Signed   By: Nelson Chimes M.D.   On: 09/28/2022 12:51   CT Head Wo Contrast  Result Date: 09/28/2022 CLINICAL DATA:  Vision changes EXAM: CT HEAD WITHOUT CONTRAST TECHNIQUE: Contiguous axial images were obtained from the base of the skull through the vertex without intravenous contrast. RADIATION DOSE REDUCTION: This exam was performed according to the departmental dose-optimization program which includes automated exposure control, adjustment of the mA and/or kV according to patient size and/or use of iterative reconstruction technique. COMPARISON:  MRI Brain  07/01/22 FINDINGS: Brain: No evidence of acute infarction, hemorrhage, hydrocephalus, extra-axial collection or mass lesion/mass effect. Sequela of mild-to-moderate chronic microvascular ischemic change. Vascular: No  hyperdense vessel or unexpected calcification. Skull: Normal. Negative for fracture or focal lesion. Sinuses/Orbits: No middle ear or mastoid effusion. Paranasal sinuses are clear. Orbits are unremarkable. Other: None. IMPRESSION: 1. No hemorrhage or CT evidence of an acute infarct. 2. Sequela of moderate chronic microvascular ischemic change. Electronically Signed   By: Marin Roberts M.D.   On: 09/28/2022 12:19     Medical Consultants:   None.   Subjective:    Patrick Bailey. is abdominal pain is not controlled he has no appetite.  Objective:    Vitals:   09/28/22 2100 09/29/22 0100 09/29/22 0500 09/29/22 0514  BP: (!) 124/95 132/86  (!) 141/91  Pulse: (!) 122 (!) 102  (!) 106  Resp: '20 20  20  '$ Temp: 97.6 F (36.4 C) 98.4 F (36.9 C)  97.9 F (36.6 C)  TempSrc: Oral Oral  Oral  SpO2: 94% 91%  96%  Weight:   86 kg   Height:       SpO2: 96 % O2 Flow Rate (L/min): 2 L/min   Intake/Output Summary (Last 24 hours) at 09/29/2022 0649 Last data filed at 09/29/2022 0106 Gross per 24 hour  Intake 2706.47 ml  Output 400 ml  Net 2306.47 ml   Filed Weights   09/28/22 1120 09/29/22 0500  Weight: 87.1 kg 86 kg    Exam: General exam: In no acute distress. Respiratory system: Good air movement and clear to auscultation. Cardiovascular system: S1 & S2 heard, RRR. No JVD. Gastrointestinal system: Abdomen is nondistended, soft and nontender.  Extremities: No pedal edema. Skin: No rashes, lesions or ulcers Psychiatry: Judgement and insight appear normal. Mood & affect appropriate.    Data Reviewed:    Labs: Basic Metabolic Panel: Recent Labs  Lab 09/28/22 1129 09/28/22 1153 09/29/22 0157  NA 131* 131* 129*  K 3.2* 3.2* 3.0*  CL 98 100 97*  CO2 17*  --  20*  GLUCOSE 145* 141* 127*  BUN 27* 30* 25*  CREATININE 1.99* 1.90* 0.92  CALCIUM 9.1  --  8.2*  MG 1.6*  --   --    GFR Estimated Creatinine Clearance: 91.4 mL/min (by C-G formula based on SCr of 0.92  mg/dL). Liver Function Tests: Recent Labs  Lab 09/28/22 1129  AST 31  ALT 12  ALKPHOS 50  BILITOT 0.9  PROT 6.1*  ALBUMIN 2.4*   Recent Labs  Lab 09/28/22 1129  LIPASE 24   No results for input(s): "AMMONIA" in the last 168 hours. Coagulation profile No results for input(s): "INR", "PROTIME" in the last 168 hours. COVID-19 Labs  No results for input(s): "DDIMER", "FERRITIN", "LDH", "CRP" in the last 72 hours.  Lab Results  Component Value Date   SARSCOV2NAA NEGATIVE 11/14/2021   SARSCOV2NAA NEGATIVE 04/04/2020   SARSCOV2NAA NEGATIVE 03/06/2020   [place] NEGATIVE 01/11/2020    CBC: Recent Labs  Lab 09/28/22 1129 09/28/22 1153 09/29/22 0157  WBC 6.1  --  5.8  NEUTROABS 4.1  --   --   HGB 13.7 13.9 12.1*  HCT 38.6* 41.0 33.2*  MCV 93.2  --  90.5  PLT 258  --  234   Cardiac Enzymes: No results for input(s): "CKTOTAL", "CKMB", "CKMBINDEX", "TROPONINI" in the last 168 hours. BNP (last 3 results) No results for input(s): "PROBNP" in the last 8760 hours. CBG: No results for  input(s): "GLUCAP" in the last 168 hours. D-Dimer: No results for input(s): "DDIMER" in the last 72 hours. Hgb A1c: No results for input(s): "HGBA1C" in the last 72 hours. Lipid Profile: No results for input(s): "CHOL", "HDL", "LDLCALC", "TRIG", "CHOLHDL", "LDLDIRECT" in the last 72 hours. Thyroid function studies: Recent Labs    09/28/22 1129 09/28/22 1413  TSH 23.597*  --   T3FREE  --  1.4*   Anemia work up: No results for input(s): "VITAMINB12", "FOLATE", "FERRITIN", "TIBC", "IRON", "RETICCTPCT" in the last 72 hours. Sepsis Labs: Recent Labs  Lab 09/28/22 1129 09/28/22 1349 09/28/22 1518 09/28/22 1758 09/29/22 0157  WBC 6.1  --   --   --  5.8  LATICACIDVEN 5.2* 3.7* 2.0* 1.9  --    Microbiology No results found for this or any previous visit (from the past 240 hour(s)).   Medications:    amitriptyline  50 mg Oral q AM   amLODipine  10 mg Oral Daily   atorvastatin   40 mg Oral Daily   clopidogrel  75 mg Oral Daily   famotidine  20 mg Oral Daily   fenofibrate  160 mg Oral Daily   folic acid  1 mg Oral Daily   gabapentin  400 mg Oral TID   heparin  5,000 Units Subcutaneous Q8H   levothyroxine  175 mcg Oral QAC breakfast   metoprolol tartrate  100 mg Oral BID   multivitamin with minerals  1 tablet Oral Daily   sodium chloride flush  3 mL Intravenous Q12H   thiamine  100 mg Oral Daily   Or   thiamine  100 mg Intravenous Daily   Continuous Infusions:  sodium chloride 100 mL/hr at 09/29/22 0512   cefTRIAXone (ROCEPHIN)  IV 1 g (09/28/22 1720)      LOS: 1 day   Charlynne Cousins  [facility]  09/29/2022, 6:49 AM

## 2022-09-29 NOTE — Progress Notes (Signed)
Daily Progress Note   Patient Name: Patrick Bailey.       Date: 09/29/2022 DOB: Dec 11, 1960  Age: 62 y.o. MRN#: [id number] Attending Physician: Charlynne Cousins, MD Primary Care Physician: Ria Bush, MD Admit Date: 09/28/2022  Reason for Consultation/Follow-up: Establishing goals of care  Subjective: Medical records reviewed including progress notes, labs, and imaging. Patient assessed at the bedside. He reports having a bad day due to difficulty with NGT placement and a sore throat. Discussed patient's request for mouth spray with RN. No family present during my visit.   Patient shares his frustrates with this morning's NGT placement, further reflecting on previous experience with this procedure and his familiarity with serious illness throughout his life. We discussed his life in more detail today - he previously worked as a Building control surveyor, loves his dog, and teases his father about being old men together when he is 37 and his father is 49.   Patrick Bailey also shares his understanding of his illness, stating "I've been told there's no cure, they can just try to make you as comfortable as they can throughout the rest of your life." He also states that he has been told he will need to take chemo until the day he dies. Counseled patient on the importance of considering his options in the context of his specific goals and values, as there are several other options for achieving comfort and improving his quality of life if this is the priority. Counseled on the importance of considering what he determines to be an acceptable quality of life. He agrees that this is important and will reflect further. Patrick Bailey's quality of life is most negatively influenced by fatigue related to his chemotherapy. He can understand  how someone would make the choice to forgo further treatments, but he is not sure when he would ever consider that.   He is feeling sleepy after receiving IV dilaudid and denies any hives or other side effects. He would like to rest and is agreeable to ongoing conversations with PMT. Outpatient palliative care was explained and offered as well. Patient is agreeable.   Questions and concerns addressed. PMT will continue to support holistically.   Length of Stay: 1   Physical Exam Vitals and nursing note reviewed.  Constitutional:      General: He is not in  acute distress.    Appearance: He is ill-appearing.     Interventions: Nasal cannula in place.     Comments: 2L  Cardiovascular:     Rate and Rhythm: Tachycardia present.  Pulmonary:     Effort: Pulmonary effort is normal. No respiratory distress.  Neurological:     Mental Status: He is alert and oriented to person, place, and time.  Psychiatric:        Behavior: Behavior is cooperative.        Cognition and Memory: Cognition normal.            Vital Signs: BP (!) 141/91 (BP Location: Left Arm)   Pulse (!) 106   Temp 98.3 F (36.8 C) (Oral)   Resp 20   Ht 6' (1.829 m)   Wt 86 kg   SpO2 96%   BMI 25.71 kg/m  SpO2: SpO2: 96 % O2 Device: O2 Device: Nasal Cannula O2 Flow Rate: O2 Flow Rate (L/min): 2 L/min      Palliative Assessment/Data:   Palliative Care Assessment & Plan   Patient Profile: 62 y.o. 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    Patient is being admitted for ileus, AKI, SIRS/lactic acidosis, possible UTI, appendiceal cancer with peritoneal carcinomatosis. PMT has been consulted to assist with goals of care conversation.  Assessment: Goals of care  conversation Stage IIIb appendiceal carcinoma s/p right hemicolectomy 11/2021  Ileus AKI, resolved Lactic acidosis  Recommendations/Plan: Continue full code/full scope treatment Ongoing goals of care discussions; patient's goal remains to follow up with outpatient oncology for chemotherapy and returning home to his dog [facility] consulted for assistance with outpatient palliative care referral Psychosocial and emotional support provided PMT will continue to follow and support   Prognosis:  < 6 months  Discharge Planning: To Be Determined  Care plan was discussed with patient, RN   MDM high         Dera Vanaken Johnnette Litter, PA-C  Palliative Medicine Team Team phone # [phone]  Thank you for allowing the Palliative Medicine Team to assist in the care of this patient. Please utilize secure chat with additional questions, if there is no response within 30 minutes please call the above phone number.  Palliative Medicine Team providers are available by phone from 7am to 7pm daily and can be reached through the team cell phone.  Should this patient require assistance outside of these hours, please call the patient's attending physician.

## 2022-09-30 ENCOUNTER — Inpatient Hospital Stay (HOSPITAL_COMMUNITY): Payer: BC Managed Care – PPO

## 2022-09-30 DIAGNOSIS — N179 Acute kidney failure, unspecified: Secondary | ICD-10-CM | POA: Diagnosis not present

## 2022-09-30 DIAGNOSIS — E440 Moderate protein-calorie malnutrition: Secondary | ICD-10-CM | POA: Insufficient documentation

## 2022-09-30 DIAGNOSIS — Z711 Person with feared health complaint in whom no diagnosis is made: Secondary | ICD-10-CM

## 2022-09-30 DIAGNOSIS — Z515 Encounter for palliative care: Secondary | ICD-10-CM | POA: Diagnosis not present

## 2022-09-30 DIAGNOSIS — K567 Ileus, unspecified: Secondary | ICD-10-CM | POA: Diagnosis not present

## 2022-09-30 DIAGNOSIS — Z789 Other specified health status: Secondary | ICD-10-CM

## 2022-09-30 DIAGNOSIS — K56609 Unspecified intestinal obstruction, unspecified as to partial versus complete obstruction: Secondary | ICD-10-CM | POA: Diagnosis not present

## 2022-09-30 LAB — MAGNESIUM: Magnesium: 2.2 mg/dL (ref 1.7–2.4)

## 2022-09-30 LAB — BASIC METABOLIC PANEL
Anion gap: 12 (ref 5–15)
BUN: 21 mg/dL (ref 8–23)
CO2: 21 mmol/L — ABNORMAL LOW (ref 22–32)
Calcium: 8.2 mg/dL — ABNORMAL LOW (ref 8.9–10.3)
Chloride: 99 mmol/L (ref 98–111)
Creatinine, Ser: 1.01 mg/dL (ref 0.61–1.24)
GFR, Estimated: >60 mL/min (ref >60)
Glucose, Bld: 131 mg/dL — ABNORMAL HIGH (ref 70–99)
Potassium: 3.6 mmol/L (ref 3.5–5.1)
Sodium: 132 mmol/L — ABNORMAL LOW (ref 135–145)

## 2022-09-30 MED ORDER — KCL IN DEXTROSE-NACL 40-5-0.45 MEQ/L-%-% IV SOLN
INTRAVENOUS | Status: AC
  Filled 2022-09-30 (×3): qty 1000

## 2022-09-30 NOTE — Plan of Care (Signed)

## 2022-09-30 NOTE — Progress Notes (Addendum)
[facility] PROGRESS NOTE    Progress Note  Patrick Bailey.  [id number] DOB: 11/03/1960 DOA: 09/28/2022 PCP: Ria Bush, MD     Brief Narrative:   Patrick Bailey. is an 62 y.o. male past medical history significant for essential hypertension, right MCA stroke, anxiety and depression head and neck, status post right hemicolectomy in 2023, recently diagnosed with peritoneal carcinomatosis on 1/24 on chemotherapy comes in complaining of abdominal pain that started 1 week prior to admission due to chemotherapy has been having chronic diarrhea he is on FOLFOX started on January this year.  CT scan of the abdomen pelvis showed mid to distal bowel mildly distended relatively decompressed colon and rectum without evidence of transition point there is scattered stool and gas present concerning for ileus    Assessment/Plan:   Abdominal pain likely due to ileus. Pain control is improved. Keep him n.p.o. The differential would include ileus versus peritoneal carcinomatosis. NG tube placed about 4 L output through the NG.  He is currently having ice chips. Started on the small bowel protocol. Abdominal x-ray is pending this morning.  SIRS/lactic acidosis: Likely due to decreased oral intake this resolved with resuscitation.  Possible UTI: Blood cultures have remained negative to date.  Were sent was started on IV Rocephin.  Acute kidney injury: With a baseline creatinine of less than 1 on admission to started on fluid resuscitation, is now back to baseline. Likely prerenal azotemia.  Peritoneal carcinomatosis of the appendix: On chemotherapy last 1 on 08/20/2022. Has a follow-up at [facility]. Palliative care was consulted he has a very poor prognosis. Increase narcotics.  Hypokalemia/hypomagnesemia: Continue replete IV recheck in the morning.  Hypovolemic hyponatremia: Resolved with IV fluid resuscitation.   Essential hypertension: Continue metoprolol  IV. Continue to hold amlodipine  Sinus tachycardia: Resolved with metoprolol IV scheduled.  Hypothyroidism: With a TSH of 23 Free T40.8, his thyroxine was increased 275 patient educated on taking it on empty stomach.  History of alcohol abuse: Denies any alcohol consumption.    DVT prophylaxis: lovenox Family Communication:none Status is: Inpatient Remains inpatient appropriate because: Abdominal pain/ileus    Code Status:     Code Status Orders  (From admission, onward)           Start     Ordered   09/28/22 1454  Full code  Continuous       Question:  By:  Answer:  Consent: discussion documented in EHR   09/28/22 1457           Code Status History     Date Active Date Inactive Code Status Order ID Comments User Context   07/20/2022 0945 07/21/2022 0509 Full Code [id number]  Luanne Bras, MD HOV   07/01/2022 1312 07/02/2022 1439 Full Code [id number]  Cox, Blackey, DO ED   11/15/2021 0149 11/25/2021 2012 Full Code [id number]  Georganna Skeans, MD ED   01/11/2020 1947 01/12/2020 2034 Full Code [id number]  Clance Boll, MD ED   01/31/2019 1319 02/01/2019 1432 Full Code [id number]  Prudencio Burly III, PA-C Inpatient   08/24/2017 1727 08/25/2017 1514 Full Code [id number]  Prudencio Burly III, PA-C Inpatient         IV Access:   Peripheral IV   Procedures and diagnostic studies:   DG Abd Portable 1V-Small Bowel Obstruction Protocol-initial, 8 hr delay  Result Date: 09/29/2022 CLINICAL DATA:  Small bowel obstruction 8 hour delayed image. History of stomach cancer with ongoing chemotherapy. EXAM:  PORTABLE ABDOMEN - 1 VIEW COMPARISON:  Previous exams. FINDINGS: Nasogastric tube has been advanced slightly with tip in the right upper quadrant. Surgical clips over the left upper quadrant. Persistent air-filled loops of large and small bowel with possible progressive worsening dilated small bowel loops in the central abdomen measuring up to 5.8 cm in  diameter. No free peritoneal air. Contrast present within the bladder. Remainder of the exam is unchanged. IMPRESSION: 1. Persistent air-filled loops of large and small bowel with possible progressive worsening dilated small bowel loops in the central abdomen measuring up to 5.8 cm in diameter. Findings may be due to ileus versus small-bowel obstruction. 2. Nasogastric tube advanced slightly with tip in the right upper quadrant. Electronically Signed   By: Marin Olp M.D.   On: 09/29/2022 20:21   DG Abd Portable 1V  Result Date: 09/29/2022 CLINICAL DATA:  Small-bowel obstruction, advanced NG tube EXAM: PORTABLE ABDOMEN - 1 VIEW COMPARISON:  09/29/2022 9:10 a.m. FINDINGS: Esophagogastric tube has been advanced, tip and side port now below the diaphragm, tip in the vicinity of the duodenal bulb. Unchanged, diffusely distended small bowel and colon in the included upper abdomen. No free air. IMPRESSION: Esophagogastric tube has been advanced, tip and side port now below the diaphragm, tip in the vicinity of the duodenal bulb. Electronically Signed   By: Delanna Ahmadi M.D.   On: 09/29/2022 10:31   DG Abd Portable 1V-Small Bowel Protocol-Position Verification  Result Date: 09/29/2022 CLINICAL DATA:  Nasogastric tube placement EXAM: PORTABLE ABDOMEN - 1 VIEW COMPARISON:  09/28/2022 FINDINGS: Nasogastric tube tip is in the gastric cardia just past the gastroesophageal junction. Consider advancing 10 cm to more firmly establish the tip in the gastric body. Worsened bandlike opacities at the lung bases favoring atelectasis. Thoracolumbar spondylosis. A central line tip projects over the distal SVC. Suspected dilated loops of upper abdominal small bowel up to about 5.3 cm in diameter. IMPRESSION: 1. Nasogastric tube tip is in the gastric cardia just past the gastroesophageal junction. Consider advancing 10 cm to more firmly establish the positioning in the gastric body. 2. Worsened bandlike opacities at the lung  bases favoring atelectasis. 3. Continued dilated loops of small bowel in the upper abdomen. Electronically Signed   By: Van Clines M.D.   On: 09/29/2022 09:18   CT ABDOMEN PELVIS WO CONTRAST  Result Date: 09/28/2022 CLINICAL DATA:  Stomach cancer, abdominal pain and distention, ongoing chemotherapy * Tracking Code: BO * EXAM: CT ABDOMEN AND PELVIS WITHOUT CONTRAST TECHNIQUE: Multidetector CT imaging of the abdomen and pelvis was performed following the standard protocol without IV contrast. RADIATION DOSE REDUCTION: This exam was performed according to the departmental dose-optimization program which includes automated exposure control, adjustment of the mA and/or kV according to patient size and/or use of iterative reconstruction technique. COMPARISON:  11/14/2021 FINDINGS: Lower chest: No acute abnormality. Hepatobiliary: No solid liver abnormality is seen. Hepatic steatosis. No gallstones, gallbladder wall thickening, or biliary dilatation. Pancreas: Unremarkable. No pancreatic ductal dilatation or surrounding inflammatory changes. Spleen: Normal in size without significant abnormality. Adrenals/Urinary Tract: Adrenal glands are unremarkable. Kidneys are normal, without renal calculi, solid lesion, or hydronephrosis. Bladder is unremarkable. Stomach/Bowel: Stomach is within normal limits. Status post right hemicolectomy and reanastomosis. The mid to distal small bowel is diffusely fluid-filled and mildly distended, largest loops in the low pelvis measuring up to 4.3 cm in caliber (series 5, image 74). The colon is patulous appearing, gas and fluid-filled, measuring up to 8.0 cm in caliber (series  5, image 43). The distal sigmoid colon and rectum are relatively decompressed, without evidence of transition point, with scattered gas and stool present to the rectum. Vascular/Lymphatic: Aortic atherosclerosis. No enlarged abdominal or pelvic lymph nodes. Reproductive: No mass or other significant  abnormality. Other: Small, fat containing bilateral inguinal hernias. No ascites. Musculoskeletal: No acute or significant osseous findings. IMPRESSION: 1. Status post right hemicolectomy and reanastomosis. 2. The mid to distal small bowel is diffusely fluid-filled and mildly distended, largest loops in the low pelvis measuring up to 4.3 cm in caliber. The colon is patulous appearing, gas and fluid-filled, measuring up to 8.0 cm in caliber. The distal sigmoid colon and rectum are relatively decompressed, without evidence of transition point, with scattered gas and stool present to the rectum. Findings are most suggestive of ileus. 3. Patient's known gastric malignancy is not clearly appreciated by CT. No noncontrast evidence of lymphadenopathy or metastatic disease in the abdomen or pelvis. 4. Hepatic steatosis. Aortic Atherosclerosis (ICD10-I70.0). Electronically Signed   By: Delanna Ahmadi M.D.   On: 09/28/2022 13:39   DG Abd Acute W/Chest  Result Date: 09/28/2022 CLINICAL DATA:  Stomach cancer. Receiving chemotherapy. Distension. EXAM: DG ABDOMEN ACUTE WITH 1 VIEW CHEST COMPARISON:  04/10/2022 FINDINGS: Power port tip in the SVC above the right atrium. Heart size upper limits of normal. The patient has taken a shallow inspiration. Allowing for that, the lungs are clear. No free air seen under the diaphragm.There is gaseous distension of the colon. Lesser dilatation of the small bowel. Findings most consistent with ileus. No free air. IMPRESSION: 1. Gaseous distension of the colon and small bowel most consistent with ileus. No free air. 2. No active cardiopulmonary disease. Shallow inspiration. Electronically Signed   By: Nelson Chimes M.D.   On: 09/28/2022 12:51   CT Head Wo Contrast  Result Date: 09/28/2022 CLINICAL DATA:  Vision changes EXAM: CT HEAD WITHOUT CONTRAST TECHNIQUE: Contiguous axial images were obtained from the base of the skull through the vertex without intravenous contrast. RADIATION DOSE  REDUCTION: This exam was performed according to the departmental dose-optimization program which includes automated exposure control, adjustment of the mA and/or kV according to patient size and/or use of iterative reconstruction technique. COMPARISON:  MRI Brain 07/01/22 FINDINGS: Brain: No evidence of acute infarction, hemorrhage, hydrocephalus, extra-axial collection or mass lesion/mass effect. Sequela of mild-to-moderate chronic microvascular ischemic change. Vascular: No hyperdense vessel or unexpected calcification. Skull: Normal. Negative for fracture or focal lesion. Sinuses/Orbits: No middle ear or mastoid effusion. Paranasal sinuses are clear. Orbits are unremarkable. Other: None. IMPRESSION: 1. No hemorrhage or CT evidence of an acute infarct. 2. Sequela of moderate chronic microvascular ischemic change. Electronically Signed   By: Marin Roberts M.D.   On: 09/28/2022 12:19     Medical Consultants:   None.   Subjective:    Patrick Bailey. Abdominal pain.Is improved he feels a lot better  Objective:    Vitals:   09/29/22 1457 09/29/22 2100 09/30/22 0008 09/30/22 0336  BP: 136/86 (!) 123/90 (!) 120/92 120/80  Pulse:  (!) 132 (!) 124 (!) 109  Resp:    18  Temp: 98.4 F (36.9 C)     TempSrc: Oral     SpO2:    98%  Weight:      Height:       SpO2: 98 % O2 Flow Rate (L/min): 2 L/min   Intake/Output Summary (Last 24 hours) at 09/30/2022 0735 Last data filed at 09/30/2022 0600 Gross per 24  hour  Intake --  Output 4001 ml  Net -4001 ml    Filed Weights   09/28/22 1120 09/29/22 0500  Weight: 87.1 kg 86 kg    Exam: General exam: In no acute distress. Respiratory system: Good air movement and clear to auscultation. Cardiovascular system: S1 & S2 heard, RRR. No JVD. Gastrointestinal system: Abdomen is nondistended, soft and nontender.  Extremities: No pedal edema. Skin: No rashes, lesions or ulcers Psychiatry: Judgement and insight appear normal. Mood & affect  appropriate.   Data Reviewed:    Labs: Basic Metabolic Panel: Recent Labs  Lab 09/28/22 1129 09/28/22 1153 09/29/22 0157 09/30/22 0158  NA 131* 131* 129* 132*  K 3.2* 3.2* 3.0* 3.6  CL 98 100 97* 99  CO2 17*  --  20* 21*  GLUCOSE 145* 141* 127* 131*  BUN 27* 30* 25* 21  CREATININE 1.99* 1.90* 0.92 1.01  CALCIUM 9.1  --  8.2* 8.2*  MG 1.6*  --  2.0 2.2    GFR Estimated Creatinine Clearance: 83.2 mL/min (by C-G formula based on SCr of 1.01 mg/dL). Liver Function Tests: Recent Labs  Lab 09/28/22 1129  AST 31  ALT 12  ALKPHOS 50  BILITOT 0.9  PROT 6.1*  ALBUMIN 2.4*    Recent Labs  Lab 09/28/22 1129  LIPASE 24    No results for input(s): "AMMONIA" in the last 168 hours. Coagulation profile No results for input(s): "INR", "PROTIME" in the last 168 hours. COVID-19 Labs  No results for input(s): "DDIMER", "FERRITIN", "LDH", "CRP" in the last 72 hours.  Lab Results  Component Value Date   SARSCOV2NAA NEGATIVE 11/14/2021   SARSCOV2NAA NEGATIVE 04/04/2020   SARSCOV2NAA NEGATIVE 03/06/2020   [place] NEGATIVE 01/11/2020    CBC: Recent Labs  Lab 09/28/22 1129 09/28/22 1153 09/29/22 0157  WBC 6.1  --  5.8  NEUTROABS 4.1  --   --   HGB 13.7 13.9 12.1*  HCT 38.6* 41.0 33.2*  MCV 93.2  --  90.5  PLT 258  --  234    Cardiac Enzymes: No results for input(s): "CKTOTAL", "CKMB", "CKMBINDEX", "TROPONINI" in the last 168 hours. BNP (last 3 results) No results for input(s): "PROBNP" in the last 8760 hours. CBG: No results for input(s): "GLUCAP" in the last 168 hours. D-Dimer: No results for input(s): "DDIMER" in the last 72 hours. Hgb A1c: No results for input(s): "HGBA1C" in the last 72 hours. Lipid Profile: No results for input(s): "CHOL", "HDL", "LDLCALC", "TRIG", "CHOLHDL", "LDLDIRECT" in the last 72 hours. Thyroid function studies: Recent Labs    09/28/22 1129 09/28/22 1413  TSH 23.597*  --   T3FREE  --  1.4*    Anemia work up: No  results for input(s): "VITAMINB12", "FOLATE", "FERRITIN", "TIBC", "IRON", "RETICCTPCT" in the last 72 hours. Sepsis Labs: Recent Labs  Lab 09/28/22 1129 09/28/22 1349 09/28/22 1518 09/28/22 1758 09/29/22 0157  WBC 6.1  --   --   --  5.8  LATICACIDVEN 5.2* 3.7* 2.0* 1.9  --     Microbiology Recent Results (from the past 240 hour(s))  Blood culture (routine x 2)     Status: None (Preliminary result)   Collection Time: 09/28/22 11:29 AM   Specimen: BLOOD RIGHT FOREARM  Result Value Ref Range Status   Specimen Description BLOOD RIGHT FOREARM  Final   Special Requests   Final    BOTTLES DRAWN AEROBIC AND ANAEROBIC Blood Culture adequate volume   Culture   Final    NO GROWTH <  24 HOURS Performed at [facility], Experiment [address] [place], [place] [address]    Report Status PENDING  Incomplete  Blood culture (routine x 2)     Status: None (Preliminary result)   Collection Time: 09/28/22 11:40 AM   Specimen: BLOOD  Result Value Ref Range Status   Specimen Description BLOOD LEFT ANTECUBITAL  Final   Special Requests   Final    BOTTLES DRAWN AEROBIC AND ANAEROBIC Blood Culture results may not be optimal due to an excessive volume of blood received in culture bottles   Culture   Final    NO GROWTH < 24 HOURS Performed at [facility], [place] [address] [place], [place] [address]    Report Status PENDING  Incomplete     Medications:    amitriptyline  50 mg Per Tube q AM   amLODipine  10 mg Per Tube Daily   atorvastatin  40 mg Per Tube Daily   clopidogrel  75 mg Per Tube Daily   famotidine  20 mg Per Tube Daily   fenofibrate  160 mg Per Tube Daily   folic acid  1 mg Per Tube Daily   gabapentin  400 mg Per Tube TID   heparin  5,000 Units Subcutaneous Q8H   levothyroxine  175 mcg Per Tube QAC breakfast   metoprolol tartrate  5 mg Intravenous Q6H   multivitamin with minerals  1 tablet Per Tube Daily   sodium chloride flush  3 mL Intravenous Q12H   thiamine  100 mg Per  Tube Daily   Or   thiamine  100 mg Intravenous Daily   Continuous Infusions:  cefTRIAXone (ROCEPHIN)  IV 1 g (09/29/22 1536)   dextrose 5 % and 0.45 % NaCl with KCl 40 mEq/L 100 mL/hr at 09/30/22 0105      LOS: 2 days   Charlynne Cousins  [facility]  09/30/2022, 7:35 AM

## 2022-09-30 NOTE — Progress Notes (Signed)
Daily Progress Note   Patient Name: Patrick Bailey.       Date: 09/30/2022 DOB: 10/11/1960  Age: 62 y.o. MRN#: [id number] Attending Physician: Charlynne Cousins, MD Primary Care Physician: Ria Bush, MD Admit Date: 09/28/2022  Reason for Consultation/Follow-up: Establishing goals of care  Subjective: I have reviewed medical records including EPIC notes and labs. Received report from primary RN - no acute concerns.   Went to visit patient at bedside - no family/visitors present. Patient was ambulating around his room. No signs or non-verbal gestures of pain or discomfort noted. No respiratory distress, increased work of breathing, or secretions noted. He complains of a sore throat and does not wish to talk today - chloraseptic spray at bedside, pt states it is helpful for small periods of time. He feels as if he needs to have a bowel movement - just attempted in the bathroom without success. Encouraged continued ambulation within/outside room as tolerated to encourage intestinal motility - he wishes to go for a walk within the halls - NT notified. NGT is in place but not connected to suction as he was just in the bathroom.  All questions and concerns addressed. Encouraged to call with questions and/or concerns. PMT card provided.  Per previous discussions, patient wishes to continue chemo for as long as he can tolerate - he plans to follow up with oncology after discharge. Patient does confirm this visit that he plans to restart chemo in 2 weeks. Outpatient Palliative Care is to follow.   Length of Stay: 2  Current Medications: Scheduled Meds:   amitriptyline  50 mg Per Tube q AM   amLODipine  10 mg Per Tube Daily   atorvastatin  40 mg Per Tube Daily   clopidogrel  75 mg Per Tube  Daily   famotidine  20 mg Per Tube Daily   fenofibrate  160 mg Per Tube Daily   folic acid  1 mg Per Tube Daily   gabapentin  400 mg Per Tube TID   heparin  5,000 Units Subcutaneous Q8H   levothyroxine  175 mcg Per Tube QAC breakfast   metoprolol tartrate  5 mg Intravenous Q6H   multivitamin with minerals  1 tablet Per Tube Daily   sodium chloride flush  3 mL Intravenous Q12H   thiamine  100 mg Per  Tube Daily   Or   thiamine  100 mg Intravenous Daily    Continuous Infusions:  cefTRIAXone (ROCEPHIN)  IV 1 g (09/29/22 1536)   dextrose 5 % and 0.45 % NaCl with KCl 40 mEq/L 100 mL/hr at 09/30/22 0923    PRN Meds: acetaminophen **OR** acetaminophen, albuterol, alum & mag hydroxide-simeth, HYDROmorphone (DILAUDID) injection, LORazepam **OR** LORazepam, ondansetron **OR** ondansetron (ZOFRAN) IV, phenol  Physical Exam Vitals and nursing note reviewed.  Constitutional:      General: He is not in acute distress.    Appearance: He is ill-appearing.  Pulmonary:     Effort: No respiratory distress.  Abdominal:     Comments: NGT  Skin:    General: Skin is warm and dry.  Neurological:     Mental Status: He is alert and oriented to person, place, and time.  Psychiatric:        Attention and Perception: Attention normal.        Behavior: Behavior is cooperative.        Cognition and Memory: Cognition and memory normal.             Vital Signs: BP 138/86   Pulse (!) 109   Temp 98.4 F (36.9 C) (Oral)   Resp 18   Ht 6' (1.829 m)   Wt 86 kg   SpO2 98%   BMI 25.71 kg/m  SpO2: SpO2: 98 % O2 Device: O2 Device: Room Air O2 Flow Rate: O2 Flow Rate (L/min): 2 L/min  Intake/output summary:  Intake/Output Summary (Last 24 hours) at 09/30/2022 1441 Last data filed at 09/30/2022 0600 Gross per 24 hour  Intake --  Output 3001 ml  Net -3001 ml   LBM: Last BM Date : 09/29/22 Baseline Weight: Weight: 87.1 kg Most recent weight: Weight: 86 kg       Palliative Assessment/Data: PPS  20% - NPO except ice chips      Patient Active Problem List   Diagnosis Date Noted   Malnutrition of moderate degree 09/30/2022   Ileus ([place]) 09/28/2022   SIRS (systemic inflammatory response syndrome) (HCC) 09/28/2022   Lactic acidosis 09/28/2022   UTI (urinary tract infection) 09/28/2022   AKI (acute kidney injury) ([place]) 09/28/2022   Hypokalemia 09/28/2022   Hypomagnesemia 09/28/2022   History of alcohol abuse 09/28/2022   Internal carotid artery stenosis 08/10/2022   Internal carotid artery stenosis, right 08/10/2022   Carotid artery disease, unspecified laterality ([place]) 08/10/2022   Acute ischemic right MCA stroke ([place]) 07/01/2022   Impacted cerumen, right ear 05/08/2022   Penile ulcer 03/04/2022   Immunocompromised ([place]) 03/04/2022   Cyst of lumbar facet joint 03/04/2022   Advanced directives, counseling/discussion 01/08/2022   GERD (gastroesophageal reflux disease) 01/08/2022   Synovial cyst of lumbar facet joint 01/08/2022   Malignant neoplasm of appendix ([place]) 12/01/2021   Other spondylosis with radiculopathy, lumbar region 11/06/2021   Facet hypertrophy of lumbar region 11/06/2021   Closed fracture of distal end of radius 03/10/2021   Left leg numbness 07/20/2020   BPH (benign prostatic hyperplasia) 01/26/2020   Central retinal artery occlusion of right eye 01/17/2020   Carotid stenosis 123XX123   Diastolic dysfunction 123XX123   Herpes labialis 01/05/2020   Hearing loss secondary to cerumen impaction, right 01/05/2020   S/P total knee arthroplasty 01/31/2019   Primary osteoarthritis of right knee 01/10/2019   Peripheral neuropathy 03/13/2018   Scalp lesion 08/04/2016   History of cerebellar stroke 07/17/2016   Hypertriglyceridemia 06/17/2015   Hypothyroidism  Degenerative arthritis of knee, bilateral 10/19/2014   Pseudogout 10/19/2014   Leg pain 08/10/2014   Essential hypertension 08/13/2010   Alcohol use 02/05/2009   TOBACCO ABUSE, HX OF 02/05/2009    History of oral cancer 11/01/2008   Anxiety state 07/24/2008   Chronic insomnia 07/24/2008   Shoulder joint pain 12/20/2006    Palliative Care Assessment & Plan   Patient Profile: 62 y.o. 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    Patient is being admitted for ileus, AKI, SIRS/lactic acidosis, possible UTI, appendiceal cancer with peritoneal carcinomatosis. PMT has been consulted to assist with goals of care conversation.  Assessment: Principal Problem:   Ileus ([place]) Active Problems:   Essential hypertension   Hypothyroidism   Malignant neoplasm of appendix (HCC)   GERD (gastroesophageal reflux disease)   SIRS (systemic inflammatory response syndrome) (HCC)   Lactic acidosis   UTI (urinary tract infection)   AKI (acute kidney injury) (HCC)   Hypokalemia   Hypomagnesemia   History of alcohol abuse   Malnutrition of moderate degree   Concern about end of life  Recommendations/Plan: Patient did not wish to talk today due to sore throat Continue full code/full scope per previous [place] discussions He wishes to continue chemotherapy for as long as he can tolerate and plans to follow up with oncology outpatient [facility] previously consulted for outpatient Palliative Care referral PMT will continue to follow peripherally. If there are any imminent needs please call the service directly   Goals of Care and Additional Recommendations: Limitations on Scope of Treatment: Full Scope Treatment  Code Status:    Code Status Orders  (From admission, onward)           Start     Ordered   09/28/22 1454  Full code  Continuous       Question:  By:  Answer:  Consent: discussion documented in EHR   09/28/22 1457           Code  Status History     Date Active Date Inactive Code Status Order ID Comments User Context   07/20/2022 0945 07/21/2022 0509 Full Code [id number]  Luanne Bras, MD HOV   07/01/2022 1312 07/02/2022 1439 Full Code [id number]  Cox, [place], DO ED   11/15/2021 0149 11/25/2021 2012 Full Code [id number]  Georganna Skeans, MD ED   01/11/2020 1947 01/12/2020 2034 Full Code [id number]  Clance Boll, MD ED   01/31/2019 1319 02/01/2019 1432 Full Code [id number]  Prudencio Burly III, PA-C Inpatient   08/24/2017 1727 08/25/2017 1514 Full Code [id number]  Sindy Messing, PA-C Inpatient       Prognosis:  < 6 months  Discharge Planning: Home with [place] was discussed with primary RN, patient  Thank you for allowing the Palliative Medicine Team to assist in the care of this patient.   Total Time 35 minutes Prolonged Time Billed  no       Greater than 50%  of this time was spent counseling and coordinating care related to the above assessment and plan.  Lin Landsman, NP  Please contact Palliative Medicine Team phone at [phone] for questions and concerns.   *Portions of this note are a verbal dictation therefore any spelling and/or grammatical errors are due to the "[place] [employer]"  system interpretation.

## 2022-09-30 NOTE — Plan of Care (Signed)

## 2022-10-01 ENCOUNTER — Inpatient Hospital Stay (HOSPITAL_COMMUNITY): Payer: BC Managed Care – PPO

## 2022-10-01 ENCOUNTER — Ambulatory Visit: Payer: BC Managed Care – PPO | Admitting: Adult Health

## 2022-10-01 DIAGNOSIS — K567 Ileus, unspecified: Secondary | ICD-10-CM | POA: Diagnosis not present

## 2022-10-01 LAB — CBC WITH DIFFERENTIAL/PLATELET
Abs Immature Granulocytes: 0 10*3/uL (ref 0.00–0.07)
Basophils Absolute: 0 10*3/uL (ref 0.0–0.1)
Basophils Relative: 0 %
Eosinophils Absolute: 0.2 10*3/uL (ref 0.0–0.5)
Eosinophils Relative: 2 %
HCT: 36.5 % — ABNORMAL LOW (ref 39.0–52.0)
Hemoglobin: 13 g/dL (ref 13.0–17.0)
Lymphocytes Relative: 17 %
Lymphs Abs: 1.7 10*3/uL (ref 0.7–4.0)
MCH: 32.8 pg (ref 26.0–34.0)
MCHC: 35.6 g/dL (ref 30.0–36.0)
MCV: 92.2 fL (ref 80.0–100.0)
Monocytes Absolute: 0.7 10*3/uL (ref 0.1–1.0)
Monocytes Relative: 7 %
Neutro Abs: 7.4 10*3/uL (ref 1.7–7.7)
Neutrophils Relative %: 74 %
Platelets: 304 10*3/uL (ref 150–400)
RBC: 3.96 MIL/uL — ABNORMAL LOW (ref 4.22–5.81)
RDW: 14.8 % (ref 11.5–15.5)
WBC: 10 10*3/uL (ref 4.0–10.5)
nRBC: 0.6 % — ABNORMAL HIGH (ref 0.0–0.2)
nRBC: 2 /100 WBC — ABNORMAL HIGH

## 2022-10-01 LAB — COMPREHENSIVE METABOLIC PANEL
ALT: 15 U/L (ref 0–44)
AST: 30 U/L (ref 15–41)
Albumin: 2.2 g/dL — ABNORMAL LOW (ref 3.5–5.0)
Alkaline Phosphatase: 94 U/L (ref 38–126)
Anion gap: 9 (ref 5–15)
BUN: 15 mg/dL (ref 8–23)
CO2: 26 mmol/L (ref 22–32)
Calcium: 8.5 mg/dL — ABNORMAL LOW (ref 8.9–10.3)
Chloride: 98 mmol/L (ref 98–111)
Creatinine, Ser: 1 mg/dL (ref 0.61–1.24)
GFR, Estimated: >60 mL/min (ref >60)
Glucose, Bld: 114 mg/dL — ABNORMAL HIGH (ref 70–99)
Potassium: 3.8 mmol/L (ref 3.5–5.1)
Sodium: 133 mmol/L — ABNORMAL LOW (ref 135–145)
Total Bilirubin: 0.8 mg/dL (ref 0.3–1.2)
Total Protein: 6.6 g/dL (ref 6.5–8.1)

## 2022-10-01 LAB — MAGNESIUM: Magnesium: 2.2 mg/dL (ref 1.7–2.4)

## 2022-10-01 LAB — PHOSPHORUS: Phosphorus: 1.3 mg/dL — ABNORMAL LOW (ref 2.5–4.6)

## 2022-10-01 MED ORDER — POTASSIUM PHOSPHATES 15 MMOLE/5ML IV SOLN: 30.0000 mmol | Freq: Once | INTRAVENOUS | Status: DC

## 2022-10-01 MED ORDER — KCL IN DEXTROSE-NACL 40-5-0.45 MEQ/L-%-% IV SOLN
INTRAVENOUS | Status: DC
  Filled 2022-10-01 (×2): qty 1000

## 2022-10-01 MED ORDER — SODIUM PHOSPHATES 45 MMOLE/15ML IV SOLN
30.0000 mmol | Freq: Once | INTRAVENOUS | Status: AC
  Administered 2022-10-01: 30 mmol via INTRAVENOUS
  Filled 2022-10-01: qty 10
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
## 2022-10-01 NOTE — Plan of Care (Signed)

## 2022-10-01 NOTE — Progress Notes (Signed)
[facility] PROGRESS NOTE    Progress Note  Patrick Bailey.  ZC:[id number] DOB: 20-Feb-1961 DOA: 09/28/2022 PCP: Ria Bush, MD     Brief Narrative:   Patrick Groesbeck. is an 62 y.o. male past medical history significant for essential hypertension, right MCA stroke, anxiety and depression head and neck, status post right hemicolectomy in 2023, recently diagnosed with peritoneal carcinomatosis on 1/24 on chemotherapy comes in complaining of abdominal pain that started 1 week prior to admission due to chemotherapy has been having chronic diarrhea he is on FOLFOX started on January this year.  CT scan of the abdomen pelvis showed mid to distal bowel mildly distended relatively decompressed colon and rectum without evidence of transition point there is scattered stool and gas present concerning for ileus.  10/01/2022: The patient was seen and examined at bedside.  States he is hungry.  NG tube in place with ongoing output through the NG tube.  States he had 2 bowel movements today.    Assessment/Plan:   Abdominal pain likely due to ileus. Continue pain control. Keep him n.p.o. The differential would include ileus versus peritoneal carcinomatosis. NG tube placed about 4 L output through the NG.  He is currently having ice chips. Continue small bowel protocol.  Resolved: SIRS/lactic acidosis: Likely due to decreased oral intake this resolved with resuscitation.  Possible UTI: Blood cultures have remained negative to date.  Were sent was started on IV Rocephin.  Resolved: Acute kidney injury: With a baseline creatinine of less than 1 on admission to started on fluid resuscitation, is now back to baseline. Likely prerenal azotemia.  Hypophosphatemia Serum phosphorus 1.3 Repleted intravenously.  Peritoneal carcinomatosis of the appendix: On chemotherapy last 1 on 08/20/2022. Has a follow-up at [facility]. Palliative care was consulted he has a very poor  prognosis. Increase narcotics.  Resolved, post repletion hypokalemia/hypomagnesemia: Continue replete IV recheck in the morning.  Essential hypertension: Continue metoprolol IV. Continue to hold amlodipine  Result: Sinus tachycardia: Resolved with metoprolol IV scheduled.  Hypothyroidism: With a TSH of 23 Free T40.8, his thyroxine was increased 275 patient educated on taking it on empty stomach.  History of alcohol abuse: Denies any alcohol consumption.    DVT prophylaxis: lovenox subcu daily Family Communication:none Status is: Inpatient Remains inpatient appropriate because: Abdominal pain/ileus    Code Status:     Code Status Orders  (From admission, onward)           Start     Ordered   09/28/22 1454  Full code  Continuous       Question:  By:  Answer:  Consent: discussion documented in EHR   09/28/22 1457           Code Status History     Date Active Date Inactive Code Status Order ID Comments User Context   07/20/2022 0945 07/21/2022 0509 Full Code [id number]  Luanne Bras, MD HOV   07/01/2022 1312 07/02/2022 1439 Full Code [id number]  Cox, Loraine, DO ED   11/15/2021 0149 11/25/2021 2012 Full Code [id number]  Georganna Skeans, MD ED   01/11/2020 1947 01/12/2020 2034 Full Code [id number]  Clance Boll, MD ED   01/31/2019 1319 02/01/2019 1432 Full Code [id number]  Prudencio Burly III, PA-C Inpatient   08/24/2017 1727 08/25/2017 1514 Full Code [id number]  Prudencio Burly III, PA-C Inpatient         IV Access:   Peripheral IV   Procedures and diagnostic studies:  DG Abd Portable 1V-Small Bowel Obstruction Protocol-24 hr delay  Result Date: 09/30/2022 CLINICAL DATA:  Small-bowel obstruction.  NG tube placement. EXAM: PORTABLE ABDOMEN - 1 VIEW COMPARISON:  09/29/2022 FINDINGS: The NG tube tip is in the descending duodenum. Persistent air distended bowel. No obvious free air. IMPRESSION: NG tube tip is in the descending duodenum.  Persistent air distended bowel. Electronically Signed   By: Marijo Sanes M.D.   On: 09/30/2022 11:47   DG Abd Portable 1V-Small Bowel Obstruction Protocol-initial, 8 hr delay  Result Date: 09/29/2022 CLINICAL DATA:  Small bowel obstruction 8 hour delayed image. History of stomach cancer with ongoing chemotherapy. EXAM: PORTABLE ABDOMEN - 1 VIEW COMPARISON:  Previous exams. FINDINGS: Nasogastric tube has been advanced slightly with tip in the right upper quadrant. Surgical clips over the left upper quadrant. Persistent air-filled loops of large and small bowel with possible progressive worsening dilated small bowel loops in the central abdomen measuring up to 5.8 cm in diameter. No free peritoneal air. Contrast present within the bladder. Remainder of the exam is unchanged. IMPRESSION: 1. Persistent air-filled loops of large and small bowel with possible progressive worsening dilated small bowel loops in the central abdomen measuring up to 5.8 cm in diameter. Findings may be due to ileus versus small-bowel obstruction. 2. Nasogastric tube advanced slightly with tip in the right upper quadrant. Electronically Signed   By: Marin Olp M.D.   On: 09/29/2022 20:21     Medical Consultants:   None.  Objective:    Vitals:   10/01/22 0021 10/01/22 0800 10/01/22 0951 10/01/22 1200  BP: 128/83 115/86 131/78 112/87  Pulse: (!) 113 (!) 102    Resp: 16 17    Temp:  98.6 F (37 C)    TempSrc:  Oral    SpO2: 92% 93%    Weight:      Height:       SpO2: 93 % O2 Flow Rate (L/min): 2 L/min   Intake/Output Summary (Last 24 hours) at 10/01/2022 1624 Last data filed at 09/30/2022 1700 Gross per 24 hour  Intake --  Output 500 ml  Net -500 ml   Filed Weights   09/28/22 1120 09/29/22 0500  Weight: 87.1 kg 86 kg    Exam: General exam: Well-developed well-nourished in no acute distress.  NG tube in place. Respiratory system: Clear to auscultation no wheezes or rales. Cardiovascular system: Regular  rate and rhythm no rubs or gallops. Gastrointestinal system: Abdomen is mildly distended.  Hypoactive bowel sounds present.   Extremities: No pedal edema. Skin: No rashes, lesions or ulcers Psychiatry: Judgment and insight appear normal.  Mood is appropriate.  Data Reviewed:    Labs: Basic Metabolic Panel: Recent Labs  Lab 09/28/22 1129 09/28/22 1153 09/29/22 0157 09/30/22 0158 10/01/22 0700  NA 131* 131* 129* 132* 133*  K 3.2* 3.2* 3.0* 3.6 3.8  CL 98 100 97* 99 98  CO2 17*  --  20* 21* 26  GLUCOSE 145* 141* 127* 131* 114*  BUN 27* 30* 25* 21 15  CREATININE 1.99* 1.90* 0.92 1.01 1.00  CALCIUM 9.1  --  8.2* 8.2* 8.5*  MG 1.6*  --  2.0 2.2 2.2  PHOS  --   --   --   --  1.3*   GFR Estimated Creatinine Clearance: 84.1 mL/min (by C-G formula based on SCr of 1 mg/dL). Liver Function Tests: Recent Labs  Lab 09/28/22 1129 10/01/22 0700  AST 31 30  ALT 12 15  ALKPHOS 50  94  BILITOT 0.9 0.8  PROT 6.1* 6.6  ALBUMIN 2.4* 2.2*   Recent Labs  Lab 09/28/22 1129  LIPASE 24   No results for input(s): "AMMONIA" in the last 168 hours. Coagulation profile No results for input(s): "INR", "PROTIME" in the last 168 hours. COVID-19 Labs  No results for input(s): "DDIMER", "FERRITIN", "LDH", "CRP" in the last 72 hours.  Lab Results  Component Value Date   SARSCOV2NAA NEGATIVE 11/14/2021   SARSCOV2NAA NEGATIVE 04/04/2020   SARSCOV2NAA NEGATIVE 03/06/2020   Maceo NEGATIVE 01/11/2020    CBC: Recent Labs  Lab 09/28/22 1129 09/28/22 1153 09/29/22 0157 10/01/22 0700  WBC 6.1  --  5.8 10.0  NEUTROABS 4.1  --   --  7.4  HGB 13.7 13.9 12.1* 13.0  HCT 38.6* 41.0 33.2* 36.5*  MCV 93.2  --  90.5 92.2  PLT 258  --  234 304   Cardiac Enzymes: No results for input(s): "CKTOTAL", "CKMB", "CKMBINDEX", "TROPONINI" in the last 168 hours. BNP (last 3 results) No results for input(s): "PROBNP" in the last 8760 hours. CBG: No results for input(s): "GLUCAP" in the last 168  hours. D-Dimer: No results for input(s): "DDIMER" in the last 72 hours. Hgb A1c: No results for input(s): "HGBA1C" in the last 72 hours. Lipid Profile: No results for input(s): "CHOL", "HDL", "LDLCALC", "TRIG", "CHOLHDL", "LDLDIRECT" in the last 72 hours. Thyroid function studies: No results for input(s): "TSH", "T4TOTAL", "T3FREE", "THYROIDAB" in the last 72 hours.  Invalid input(s): "FREET3" Anemia work up: No results for input(s): "VITAMINB12", "FOLATE", "FERRITIN", "TIBC", "IRON", "RETICCTPCT" in the last 72 hours. Sepsis Labs: Recent Labs  Lab 09/28/22 1129 09/28/22 1349 09/28/22 1518 09/28/22 1758 09/29/22 0157 10/01/22 0700  WBC 6.1  --   --   --  5.8 10.0  LATICACIDVEN 5.2* 3.7* 2.0* 1.9  --   --    Microbiology Recent Results (from the past 240 hour(s))  Blood culture (routine x 2)     Status: None (Preliminary result)   Collection Time: 09/28/22 11:29 AM   Specimen: BLOOD RIGHT FOREARM  Result Value Ref Range Status   Specimen Description BLOOD RIGHT FOREARM  Final   Special Requests   Final    BOTTLES DRAWN AEROBIC AND ANAEROBIC Blood Culture adequate volume   Culture   Final    NO GROWTH 3 DAYS Performed at [facility], [place] [address], [place], [place] [address]    Report Status PENDING  Incomplete  Blood culture (routine x 2)     Status: None (Preliminary result)   Collection Time: 09/28/22 11:40 AM   Specimen: BLOOD  Result Value Ref Range Status   Specimen Description BLOOD LEFT ANTECUBITAL  Final   Special Requests   Final    BOTTLES DRAWN AEROBIC AND ANAEROBIC Blood Culture results may not be optimal due to an excessive volume of blood received in culture bottles   Culture   Final    NO GROWTH 3 DAYS Performed at [facility], [place] [address] [place], [place] [address]    Report Status PENDING  Incomplete     Medications:    amitriptyline  50 mg Per Tube q AM   amLODipine  10 mg Per Tube Daily   atorvastatin  40 mg Per Tube Daily    clopidogrel  75 mg Per Tube Daily   famotidine  20 mg Per Tube Daily   fenofibrate  160 mg Per Tube Daily   folic acid  1 mg  Per Tube Daily   gabapentin  400 mg Per Tube TID   heparin  5,000 Units Subcutaneous Q8H   levothyroxine  175 mcg Per Tube QAC breakfast   metoprolol tartrate  5 mg Intravenous Q6H   multivitamin with minerals  1 tablet Per Tube Daily   sodium chloride flush  3 mL Intravenous Q12H   thiamine  100 mg Per Tube Daily   Or   thiamine  100 mg Intravenous Daily   Continuous Infusions:  cefTRIAXone (ROCEPHIN)  IV Stopped (09/30/22 1752)      LOS: 3 days   Kayleen Memos  [facility]  10/01/2022, 4:24 PM

## 2022-10-01 NOTE — Progress Notes (Signed)
Mobility Specialist Progress Note    10/01/22 1014  Mobility  Activity Ambulated with assistance in hallway  Level of Assistance Contact guard assist, steadying assist  Assistive Device Other (Comment) (IV pole)  Distance Ambulated (ft) 400 ft  Activity Response Tolerated well  Mobility Referral Yes  $Mobility charge 1 Mobility   Pt received in bed and agreeable. No complaints on walk. Returned to sitting EOB with call bell in reach. RN notified.   Hildred Alamin Mobility Specialist  Please Psychologist, sport and exercise or Rehab Office at [phone]

## 2022-10-01 NOTE — Progress Notes (Signed)
Mobility Specialist Progress Note    10/01/22 1451  Mobility  Activity Ambulated with assistance in hallway  Level of Assistance Standby assist, set-up cues, supervision of patient - no hands on  Assistive Device Other (Comment) (IV pole)  Distance Ambulated (ft) 500 ft  Activity Response Tolerated well  Mobility Referral Yes  $Mobility charge 1 Mobility   Pt received requesting to walk again. No complaints. Returned to room with call bell in reach.   Hildred Alamin Mobility Specialist  Please Psychologist, sport and exercise or Rehab Office at [phone]

## 2022-10-02 ENCOUNTER — Inpatient Hospital Stay (HOSPITAL_COMMUNITY): Payer: BC Managed Care – PPO

## 2022-10-02 ENCOUNTER — Inpatient Hospital Stay: Payer: Self-pay

## 2022-10-02 DIAGNOSIS — K567 Ileus, unspecified: Secondary | ICD-10-CM | POA: Diagnosis not present

## 2022-10-02 LAB — BASIC METABOLIC PANEL
Anion gap: 10 (ref 5–15)
BUN: 12 mg/dL (ref 8–23)
CO2: 25 mmol/L (ref 22–32)
Calcium: 7.9 mg/dL — ABNORMAL LOW (ref 8.9–10.3)
Chloride: 99 mmol/L (ref 98–111)
Creatinine, Ser: 0.81 mg/dL (ref 0.61–1.24)
GFR, Estimated: >60 mL/min (ref >60)
Glucose, Bld: 122 mg/dL — ABNORMAL HIGH (ref 70–99)
Potassium: 3.4 mmol/L — ABNORMAL LOW (ref 3.5–5.1)
Sodium: 134 mmol/L — ABNORMAL LOW (ref 135–145)

## 2022-10-02 LAB — PHOSPHORUS: Phosphorus: 2.3 mg/dL — ABNORMAL LOW (ref 2.5–4.6)

## 2022-10-02 LAB — GLUCOSE, CAPILLARY: Glucose-Capillary: 126 mg/dL — ABNORMAL HIGH (ref 70–99)

## 2022-10-02 MED ORDER — TRAVASOL 10 % IV SOLN
INTRAVENOUS | Status: AC
  Filled 2022-10-02: qty 561.6

## 2022-10-02 MED ORDER — INSULIN ASPART 100 UNIT/ML IJ SOLN
0.0000 [IU] | INTRAMUSCULAR | Status: DC
  Administered 2022-10-02 – 2022-10-05 (×3): 1 [IU] via SUBCUTANEOUS
  Administered 2022-10-05: 2 [IU] via SUBCUTANEOUS

## 2022-10-02 MED ORDER — CHLORHEXIDINE GLUCONATE CLOTH 2 % EX PADS
6.0000 | MEDICATED_PAD | Freq: Every day | CUTANEOUS | Status: DC
  Administered 2022-10-03 – 2022-10-05 (×3): 6 via TOPICAL

## 2022-10-02 MED ORDER — SODIUM CHLORIDE 0.45 % IV SOLN: INTRAVENOUS | Status: DC

## 2022-10-02 MED ORDER — POTASSIUM PHOSPHATES 15 MMOLE/5ML IV SOLN
30.0000 mmol | Freq: Once | INTRAVENOUS | Status: AC
  Administered 2022-10-02: 30 mmol via INTRAVENOUS
  Filled 2022-10-02: qty 10

## 2022-10-02 MED ORDER — KCL IN DEXTROSE-NACL 40-5-0.45 MEQ/L-%-% IV SOLN
INTRAVENOUS | Status: DC
  Filled 2022-10-02 (×2): qty 1000

## 2022-10-02 MED ORDER — SODIUM CHLORIDE 0.9% FLUSH
10.0000 mL | Freq: Two times a day (BID) | INTRAVENOUS | Status: DC
  Administered 2022-10-03 – 2022-10-05 (×2): 10 mL

## 2022-10-02 NOTE — Progress Notes (Signed)
PHARMACY - TOTAL PARENTERAL NUTRITION CONSULT NOTE   Indication: Prolonged ileus, malnutrition  Patient Measurements: Height: 6' (182.9 cm) Weight: 86 kg (189 lb 9.5 oz) IBW/kg (Calculated) : 77.6 TPN AdjBW (KG): 87.1 Body mass index is 25.71 kg/m. Usual Weight: 89-90kg  Assessment: 62 yo male presented with abdominal pain and hypotension. 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 Patient was placed on bowel rest and NG tube was placed on admission.  Glucose / Insulin: no hx DM, CBGs < 150 with no SSI ordered. Currently receiving D5-1/2NS + Kcl @ 29m/hr. - Keep K >4, Mg > 2 Electrolytes: K 3.4, Na 133, Phos 2.3 (s/p NaPhos 311ml IV x1), Mg 2.2 (on 2/29) Renal: Scr 0.8, BUN 12 (appears stable) Hepatic: LFTs, Tbili, alk phos on 2/29 all WNL; albumin 2.2 (low) Intake / Output; MIVF: D5-1/2NS + Kcl @ 7514mr; 200 mL NG output thus far today, no UOP charted, sips of liquids for comfort GI Imaging: - 3/1 Abd x-ray - progressive small bowel dilation, favoring pSBO GI Surgeries / Procedures:  - None this admission  Central access: ordered for 3/1 PM  TPN start date: 3/1  Nutritional Goals: Goal TPN rate is 95 mL/hr (provides 118 g of protein and 2304 kcals per day)  RD Assessment: Estimated Needs Total Energy Estimated Needs: 2300-2500 Total Protein Estimated Needs: 115-130g Total Fluid Estimated Needs: >/=2L  Current Nutrition:  NPO except for ice chips/sips of clears TPN to start 3/1 PM   Plan:  Start TPN at 76m20m at 1800 (will provide 56g AA and 1091 kcal meeting ~50% of patients needs) Electrolytes in TPN: Na 50mE67m K 40mEq51mCa 5mEq/L64mg 5mEq/L,16md Phos 15mmol/L62m:Ac 1:1 *Receiving KPhos 30mmol IV32mcurrently which will provide ~45 mEq of K, D5-1/2NS + Kcl fluids will provide ~72 mEq over 24 hours Add standard MVI and trace elements to  TPN Initiate Sensitive q4h SSI and adjust as needed  Reduce MIVF to 1/2NS at 30 mL/hr at 1800 - remove dextrose + KCl Monitor TPN labs on Mon/Thurs and additionally as needed Patient has been getting thiamine per tube (day 3) - unsure how well he is absorbing so will add thiamine to TPN for 5 days given refeeding risk Add folic acid and famotidine to TPN as well (d/w CCS and prefer IV meds/in TPN if possible)   Mary-AshlyDimple NanasBCPS 10/02/2022 10:53 AM

## 2022-10-02 NOTE — Progress Notes (Signed)
Pt alert and oriented. MD rounded last pm and had nurse pull NG back about 15cm. Pt alert and oriented, respirations even and unlabored, skin warm and dry. NG to Intermittent low wall suction. Prn pain med with effectiveness noted. No s/sx distress.

## 2022-10-02 NOTE — Progress Notes (Signed)
[facility] PROGRESS NOTE    Progress Note  Patrick Bailey.  ZC:[id number] DOB: Jan 11, 1961 DOA: 09/28/2022 PCP: Ria Bush, MD     Brief Narrative:   Patrick Bailey. is an 62 y.o. male past medical history significant for essential hypertension, right MCA stroke, anxiety, and depression, status post right hemicolectomy in 2023, recently diagnosed with peritoneal carcinomatosis on 1/24 on chemotherapy comes in complaining of abdominal pain that started 1 week prior to admission due to chemotherapy.  Has been having chronic diarrhea.  He is on FOLFOX started on January this year.  Imaging concerning for ileus.  Seen by general surgery.  10/02/2022: The patient was seen and examined at bedside.  His NG tube was pulled back 15 cm as it was in his duodenum.  General surgery following, appreciate assistance.    Assessment/Plan:   Abdominal pain likely due to ileus. On clear liquid diet, requested by the patient Continue to monitor NG output. Optimize magnesium and potassium levels Continue IV fluid hydration  Moderate protein calorie malnutrition Obtain PICC line Start TPN Obtain prealbumin level in the morning.  Resolved: SIRS/lactic acidosis: Likely due to decreased oral intake this resolved with resuscitation.  Possible UTI: Blood cultures have remained negative to date.   Received IV Rocephin.  Resolved: Acute kidney injury: With a baseline creatinine of less than 1 on admission to started on fluid resuscitation, is now back to baseline. Likely prerenal azotemia.  Refractory hypophosphatemia Serum phosphorus 1.3>> 2.3 Repleted intravenously.  Peritoneal carcinomatosis of the appendix: On chemotherapy last 1 on 08/20/2022. Has a follow-up at [facility] Palliative care was consulted.  He has a very poor prognosis. Pain management in place.  Hypokalemia/hypomagnesemia: Replete electrolytes as indicated.  Essential hypertension: Continue  metoprolol IV. Monitor vital signs  Sinus tachycardia: IV Lopressor. Monitor vital signs  Hypothyroidism: With a TSH of 23 Free T40.8, his thyroxine was increased to 175 micrograms per day. Patient educated on taking it on empty stomach.  History of alcohol abuse: Denies any alcohol consumption.    DVT prophylaxis: lovenox subcu daily Family Communication:none Status is: Inpatient Remains inpatient appropriate because: Abdominal pain/persistent ileus    Code Status:     Code Status Orders  (From admission, onward)           Start     Ordered   09/28/22 1454  Full code  Continuous       Question:  By:  Answer:  Consent: discussion documented in EHR   09/28/22 1457           Code Status History     Date Active Date Inactive Code Status Order ID Comments User Context   07/20/2022 0945 07/21/2022 0509 Full Code [id number]  Luanne Bras, MD HOV   07/01/2022 1312 07/02/2022 1439 Full Code [id number]  Cox, [place], DO ED   11/15/2021 0149 11/25/2021 2012 Full Code [id number]  Georganna Skeans, MD ED   01/11/2020 1947 01/12/2020 2034 Full Code [id number]  Clance Boll, MD ED   01/31/2019 1319 02/01/2019 1432 Full Code [id number]  Prudencio Burly III, PA-C Inpatient   08/24/2017 1727 08/25/2017 1514 Full Code [id number]  Prudencio Burly III, PA-C Inpatient         IV Access:   Peripheral IV   Procedures and diagnostic studies:   [place] EKG SITE RITE  Result Date: 10/02/2022 If Site Rite image not attached, placement could not be confirmed due to current cardiac rhythm.  DG Abd  Portable 1V  Result Date: 10/02/2022 CLINICAL DATA:  Ileus EXAM: PORTABLE ABDOMEN - 1 VIEW COMPARISON:  Yesterday FINDINGS: Nasogastric tube terminates at the distal stomach. Moderate small bowel dilatation at up to 4.9 cm. Similar bowel loop of 4.4 cm on the prior. Normal caliber, gas and stool filled colon. Contrast within the rectum. No gross free intraperitoneal air.  IMPRESSION: Progressive small bowel dilatation, favoring partial small bowel obstruction over adynamic ileus. Electronically Signed   By: Abigail Miyamoto M.D.   On: 10/02/2022 08:10   DG Abd Portable 1V  Result Date: 10/01/2022 CLINICAL DATA:  Iliacs.  Abdominal pain. EXAM: PORTABLE ABDOMEN - 1 VIEW COMPARISON:  09/30/2022 FINDINGS: Enteric tube tip overlies the level of the descending portion of the duodenum. There has been some interval improvement in dilatation of small bowel loops. Persistent dilatation of both small and large bowel loops is noted. Contrast is identified within the rectum previous contrast study. No evidence for free intraperitoneal air. IMPRESSION: 1. Enteric tube tip overlies the descending portion of the duodenum. 2. Persistent dilatation of both small and large bowel loops, with some interval improvement. Electronically Signed   By: Nolon Nations M.D.   On: 10/01/2022 18:03     Medical Consultants:   None.  Objective:    Vitals:   10/02/22 0700 10/02/22 0837 10/02/22 1322 10/02/22 1408  BP: (!) 123/94 (!) 131/93 124/87   Pulse: 87 (!) 110 (!) 104   Resp:  17    Temp:  99 F (37.2 C)    TempSrc:  Oral    SpO2:  91% 97%   Weight:    83.3 kg  Height:       SpO2: 97 % O2 Flow Rate (L/min): 2 L/min   Intake/Output Summary (Last 24 hours) at 10/02/2022 1622 Last data filed at 10/02/2022 0700 Gross per 24 hour  Intake 120 ml  Output 1600 ml  Net -1480 ml   Filed Weights   09/28/22 1120 09/29/22 0500 10/02/22 1408  Weight: 87.1 kg 86 kg 83.3 kg    Exam: General exam: Well-developed well-nourished in no acute distress.  NG tube in place.   Respiratory system: Clear to auscultation no wheezes no rales.  Cardiovascular system: Regular rate and rhythm no rubs or gallops. Gastrointestinal system: Abdomen is mildly distended.  Hypoactive bowel sounds with improved  extremities: No pedal edema. Skin: No rashes, lesions or ulcers Psychiatry: Mood is appropriate  for condition setting.  Data Reviewed:    Labs: Basic Metabolic Panel: Recent Labs  Lab 09/28/22 1129 09/28/22 1153 09/29/22 0157 09/30/22 0158 10/01/22 0700 10/02/22 0630  NA 131* 131* 129* 132* 133* 134*  K 3.2* 3.2* 3.0* 3.6 3.8 3.4*  CL 98 100 97* 99 98 99  CO2 17*  --  20* 21* 26 25  GLUCOSE 145* 141* 127* 131* 114* 122*  BUN 27* 30* 25* '21 15 12  '$ CREATININE 1.99* 1.90* 0.92 1.01 1.00 0.81  CALCIUM 9.1  --  8.2* 8.2* 8.5* 7.9*  MG 1.6*  --  2.0 2.2 2.2  --   PHOS  --   --   --   --  1.3* 2.3*   GFR Estimated Creatinine Clearance: 103.8 mL/min (by C-G formula based on SCr of 0.81 mg/dL). Liver Function Tests: Recent Labs  Lab 09/28/22 1129 10/01/22 0700  AST 31 30  ALT 12 15  ALKPHOS 50 94  BILITOT 0.9 0.8  PROT 6.1* 6.6  ALBUMIN 2.4* 2.2*   Recent Labs  Lab 09/28/22 1129  LIPASE 24   No results for input(s): "AMMONIA" in the last 168 hours. Coagulation profile No results for input(s): "INR", "PROTIME" in the last 168 hours. COVID-19 Labs  No results for input(s): "DDIMER", "FERRITIN", "LDH", "CRP" in the last 72 hours.  Lab Results  Component Value Date   SARSCOV2NAA NEGATIVE 11/14/2021   SARSCOV2NAA NEGATIVE 04/04/2020   SARSCOV2NAA NEGATIVE 03/06/2020   [place] NEGATIVE 01/11/2020    CBC: Recent Labs  Lab 09/28/22 1129 09/28/22 1153 09/29/22 0157 10/01/22 0700  WBC 6.1  --  5.8 10.0  NEUTROABS 4.1  --   --  7.4  HGB 13.7 13.9 12.1* 13.0  HCT 38.6* 41.0 33.2* 36.5*  MCV 93.2  --  90.5 92.2  PLT 258  --  234 304   Cardiac Enzymes: No results for input(s): "CKTOTAL", "CKMB", "CKMBINDEX", "TROPONINI" in the last 168 hours. BNP (last 3 results) No results for input(s): "PROBNP" in the last 8760 hours. CBG: No results for input(s): "GLUCAP" in the last 168 hours. D-Dimer: No results for input(s): "DDIMER" in the last 72 hours. Hgb A1c: No results for input(s): "HGBA1C" in the last 72 hours. Lipid Profile: No results for  input(s): "CHOL", "HDL", "LDLCALC", "TRIG", "CHOLHDL", "LDLDIRECT" in the last 72 hours. Thyroid function studies: No results for input(s): "TSH", "T4TOTAL", "T3FREE", "THYROIDAB" in the last 72 hours.  Invalid input(s): "FREET3" Anemia work up: No results for input(s): "VITAMINB12", "FOLATE", "FERRITIN", "TIBC", "IRON", "RETICCTPCT" in the last 72 hours. Sepsis Labs: Recent Labs  Lab 09/28/22 1129 09/28/22 1349 09/28/22 1518 09/28/22 1758 09/29/22 0157 10/01/22 0700  WBC 6.1  --   --   --  5.8 10.0  LATICACIDVEN 5.2* 3.7* 2.0* 1.9  --   --    Microbiology Recent Results (from the past 240 hour(s))  Blood culture (routine x 2)     Status: None (Preliminary result)   Collection Time: 09/28/22 11:29 AM   Specimen: BLOOD RIGHT FOREARM  Result Value Ref Range Status   Specimen Description BLOOD RIGHT FOREARM  Final   Special Requests   Final    BOTTLES DRAWN AEROBIC AND ANAEROBIC Blood Culture adequate volume   Culture   Final    NO GROWTH 4 DAYS Performed at [facility], [place] [address] [place], [place] [address]    Report Status PENDING  Incomplete  Blood culture (routine x 2)     Status: None (Preliminary result)   Collection Time: 09/28/22 11:40 AM   Specimen: BLOOD  Result Value Ref Range Status   Specimen Description BLOOD LEFT ANTECUBITAL  Final   Special Requests   Final    BOTTLES DRAWN AEROBIC AND ANAEROBIC Blood Culture results may not be optimal due to an excessive volume of blood received in culture bottles   Culture   Final    NO GROWTH 4 DAYS Performed at [facility], [place] [address], [place], [place] [address]    Report Status PENDING  Incomplete     Medications:    amitriptyline  50 mg Per Tube q AM   amLODipine  10 mg Per Tube Daily   atorvastatin  40 mg Per Tube Daily   clopidogrel  75 mg Per Tube Daily   fenofibrate  160 mg Per Tube Daily   gabapentin  400 mg Per Tube TID   heparin  5,000 Units Subcutaneous Q8H   insulin aspart   0-9 Units Subcutaneous Q4H   levothyroxine  175 mcg Per Tube  QAC breakfast   metoprolol tartrate  5 mg Intravenous Q6H   sodium chloride flush  3 mL Intravenous Q12H   Continuous Infusions:  sodium chloride     cefTRIAXone (ROCEPHIN)  IV 1 g (10/01/22 1633)   dextrose 5 % and 0.45 % NaCl with KCl 40 mEq/L     potassium PHOSPHATE IVPB (in mmol) 30 mmol (10/02/22 1053)   TPN ADULT (ION)        LOS: 4 days   Kayleen Memos  [facility]  10/02/2022, 4:22 PM

## 2022-10-02 NOTE — Progress Notes (Signed)
Subjective: Continues to have some pain, but knows this is chronic and long-term because of his cancer.  Continues to have diarrhea and flatus.  NGT in place still with bilious output.  Ambulating during most of our visit.   Objective: Vital signs in last 24 hours: Temp:  [99 F (37.2 C)-99.4 F (37.4 C)] 99 F (37.2 C) (03/01 0837) Pulse Rate:  [86-110] 110 (03/01 0837) Resp:  [16-17] 17 (03/01 0837) BP: (112-131)/(74-94) 131/93 (03/01 0837) SpO2:  [91 %-94 %] 91 % (03/01 0837) Last BM Date : 10/02/22  Intake/Output from previous day: 02/29 0701 - 03/01 0700 In: 120 [NG/GT:120] Out: 1600 [Emesis/NG output:1600] Intake/Output this shift: No intake/output data recorded.  PE: Gen: NAD Abd: distended, somewhat tender to palpation, but around baseline, NGT with bilious output and pulled back.  Lab Results:  Recent Labs    10/01/22 0700  WBC 10.0  HGB 13.0  HCT 36.5*  PLT 304   BMET Recent Labs    10/01/22 0700 10/02/22 0630  NA 133* 134*  K 3.8 3.4*  CL 98 99  CO2 26 25  GLUCOSE 114* 122*  BUN 15 12  CREATININE 1.00 0.81  CALCIUM 8.5* 7.9*   PT/INR No results for input(s): "LABPROT", "INR" in the last 72 hours. CMP     Component Value Date/Time   NA 134 (L) 10/02/2022 0630   NA 137 03/11/2014 1025   K 3.4 (L) 10/02/2022 0630   K 4.0 03/11/2014 1025   CL 99 10/02/2022 0630   CL 104 03/11/2014 1025   CO2 25 10/02/2022 0630   CO2 25 03/11/2014 1025   GLUCOSE 122 (H) 10/02/2022 0630   GLUCOSE 132 (H) 03/11/2014 1025   BUN 12 10/02/2022 0630   BUN 15 03/11/2014 1025   CREATININE 0.81 10/02/2022 0630   CREATININE 1.06 12/01/2019 1627   CALCIUM 7.9 (L) 10/02/2022 0630   CALCIUM 8.6 03/11/2014 1025   PROT 6.6 10/01/2022 0700   PROT 7.1 02/23/2018 1627   ALBUMIN 2.2 (L) 10/01/2022 0700   AST 30 10/01/2022 0700   ALT 15 10/01/2022 0700   ALKPHOS 94 10/01/2022 0700   BILITOT 0.8 10/01/2022 0700   GFRNONAA >60 10/02/2022 0630   GFRNONAA >60  03/11/2014 1025   GFRAA >60 01/11/2020 1609   GFRAA >60 03/11/2014 1025   Lipase     Component Value Date/Time   LIPASE 24 09/28/2022 1129       Studies/Results: DG Abd Portable 1V  Result Date: 10/02/2022 CLINICAL DATA:  Ileus EXAM: PORTABLE ABDOMEN - 1 VIEW COMPARISON:  Yesterday FINDINGS: Nasogastric tube terminates at the distal stomach. Moderate small bowel dilatation at up to 4.9 cm. Similar bowel loop of 4.4 cm on the prior. Normal caliber, gas and stool filled colon. Contrast within the rectum. No gross free intraperitoneal air. IMPRESSION: Progressive small bowel dilatation, favoring partial small bowel obstruction over adynamic ileus. Electronically Signed   By: Abigail Miyamoto M.D.   On: 10/02/2022 08:10   DG Abd Portable 1V  Result Date: 10/01/2022 CLINICAL DATA:  Iliacs.  Abdominal pain. EXAM: PORTABLE ABDOMEN - 1 VIEW COMPARISON:  09/30/2022 FINDINGS: Enteric tube tip overlies the level of the descending portion of the duodenum. There has been some interval improvement in dilatation of small bowel loops. Persistent dilatation of both small and large bowel loops is noted. Contrast is identified within the rectum previous contrast study. No evidence for free intraperitoneal air. IMPRESSION: 1. Enteric tube tip overlies the  descending portion of the duodenum. 2. Persistent dilatation of both small and large bowel loops, with some interval improvement. Electronically Signed   By: Nolon Nations M.D.   On: 10/01/2022 18:03   DG Abd Portable 1V-Small Bowel Obstruction Protocol-24 hr delay  Result Date: 09/30/2022 CLINICAL DATA:  Small-bowel obstruction.  NG tube placement. EXAM: PORTABLE ABDOMEN - 1 VIEW COMPARISON:  09/29/2022 FINDINGS: The NG tube tip is in the descending duodenum. Persistent air distended bowel. No obvious free air. IMPRESSION: NG tube tip is in the descending duodenum. Persistent air distended bowel. Electronically Signed   By: Marijo Sanes M.D.   On: 09/30/2022  11:47    Anti-infectives: Anti-infectives (From admission, onward)    Start     Dose/Rate Route Frequency Ordered Stop   09/28/22 1645  cefTRIAXone (ROCEPHIN) 1 g in sodium chloride 0.9 % 100 mL IVPB        1 g 200 mL/hr over 30 Minutes Intravenous Every 24 hours 09/28/22 1643          Assessment/Plan History of colon cancer with peritoneal carcinomatosis, suspected ileus vs psbo -followed by [facility] for this condition -x-rays today have been reviewed and show worsening SB dilatation, but he still have air and contrast in his colon.  This is c/w ileus vs psbo.  I do not think he is completely blocked as he continued to have some bowel function -unclear given his carcinomatosis whether this is something that will ultimately resolve or not.  If this is just an ileus, there is no surgical correction.  With that being said, even if this is a psbo, given his carcinomatosis, not sure that he would be surgical candidate. -cont NGT and follow for improvement. -will allow sips of clears from the floor for comfort.  These just need to be documented so we have an accurate idea of his actual NGT output.  -K 3.4 today.  Would recommend K >4 and Mg>2 to help with bowel function.  Has been ordered by primary  FEN - sips from floor/NGT/IVFs VTE - plavix, heparin ID - rocephin for UTI  I reviewed hospitalist notes, last 24 h vitals and pain scores, last 48 h intake and output, last 24 h labs and trends, and last 24 h imaging results.   LOS: 4 days    Henreitta Cea , [facility] 10/02/2022, 10:41 AM Please see Amion for pager number during day hours 7:00am-4:30pm or 7:00am -11:30am on weekends

## 2022-10-02 NOTE — Progress Notes (Incomplete)
PHARMACY - TOTAL PARENTERAL NUTRITION CONSULT NOTE   Indication: Prolonged ileus and Small bowel obstruction  Patient Measurements: Height: 6' (182.9 cm) Weight: 86 kg (189 lb 9.5 oz) IBW/kg (Calculated) : 77.6 TPN AdjBW (KG): 87.1 Body mass index is 25.71 kg/m. 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 Has been receving chemotherapy since last year. He complains of severe abdominal pain and chronic diarrhea due to chemotherapy, denies any vomiting. Abdominal Xray concerning for ileus. Abdominal Xray this morning concerning for SBO over ileus, progressive small bowel dilation.  Pt reports eating well up until Tuesday 2/26 which is when he started having abdominal pain. Placed on diet 2/26 despite recommendation for bowel rest. Has not been eating or drinking past week and currently has NG tube but no tube feeds. Todays is NPO day 5 and pharmacy consulted for TPN for small bowel obstruction.  Glucose / Insulin: no hx DM, CBG 127-141 on 100 mL/hr D5 and 1/2 NS with KCl, CBG 114-122 on 75 mL/hr D5 and 1/2 NS with KCl Electrolytes: Na 134, K 3.8>3.4 ,Phos 2.3 (s/p 30 mmol Naphos, giving 30 mmol Kphos) Renal: Scr 0.81, at bl Hepatic: LFTs, Tbili WNL  Intake / Output; MIVF: Emesis/NG output 1.6 L (6.5 L total), LBM 3/1, 75 mL/hr D5 and 1/2 NS with 40 mEq KCl GI Imaging: GI Surgeries / Procedures:   Central access:  TPN start date: 3/1  Nutritional Goals: Goal TPN rate is 104 mL/hr (provides 120 g of protein and 2,325 kcals per day)  RD Assessment: Estimated Needs Total Energy Estimated Needs: 2300-2500 Total Protein Estimated Needs: 115-130g Total Fluid Estimated Needs: >/=2L  Current Nutrition:  Sips of Clear liquids from floor  Plan:  Start TPN at 30 mL/hr at 1800 Provide 40 g protein, 767 kCal Electrolytes in TPN: Na 46mq/L, K  5776m/L, Ca 76m13mL, Mg 76mE90m, and Phos 176mm84m. Cl:Ac 1:1 45 mEq K from Kphos, 37 mEq from TPN, 72 mEq from MIVF over 24 hours Add standard MVI and trace elements to TPN Initiate Sensitive q6h SSI and adjust as needed  Reduce MIVF to 40 mL/hr at 1800, remove Kcl from MIVF Monitor TPN labs on Mon/Thurs Add thiamine 100 [id number] folic acid to TPN, has been getting thiamine per tube x 3 days  Karter Haire 10/02/2022,10:54 AM

## 2022-10-02 NOTE — Progress Notes (Addendum)
Updated patient's sister's Ms. Morton Amy, per the patient's request,  cell phone # [phone].    The patient's sister, Ivin Booty, who is also his emergency contact requests that [facility] and surgical team updates her daily.

## 2022-10-02 NOTE — Progress Notes (Signed)
Mobility Specialist Progress Note    10/02/22 1031  Mobility  Activity Ambulated with assistance in hallway  Level of Assistance Standby assist, set-up cues, supervision of patient - no hands on  Assistive Device Other (Comment) (IV pole)  Distance Ambulated (ft) 400 ft  Activity Response Tolerated well  Mobility Referral Yes  $Mobility charge 1 Mobility   Pt received in bed and agreeable. No complaints on walk. Returned to sitting EOB with call bell in reach.   Hildred Alamin Mobility Specialist  Please Psychologist, sport and exercise or Rehab Office at [phone]

## 2022-10-02 NOTE — Progress Notes (Signed)
Nutrition Follow-up  DOCUMENTATION CODES:   Non-severe (moderate) malnutrition in context of chronic illness  INTERVENTION:  TPN - management per pharmacy Pt is at high refeeding risk, once able to receive nutrition, recommend monitoring magnesium, potassium and phosphorus BID x3 days Recommend '100mg'$  of thiamine x 5 days in TPN bag  NUTRITION DIAGNOSIS:   Moderate Malnutrition related to chronic illness (appendiceal and peritoneal carcinamatosis) as evidenced by moderate fat depletion, severe muscle depletion, moderate muscle depletion. - remains applicable  GOAL:   Patient will meet greater than or equal to 90% of their needs - progressing  MONITOR:   Labs, Weight trends, Diet advancement, I & O's  REASON FOR ASSESSMENT:   Consult New TPN/TNA  ASSESSMENT:   Pt admitted form home with abdominal pain and hypotension. Found to have ileus upon admission. 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 Now with recurrent appendiceal and peritoneal carcinomatosis (08/2022) on chemo.  Pt resting in bed at the time of visit. States that he has been trying to walk to encourage return of bowel function.   Imaging this AM shows either a continued partial SBO or a paralytic ileus. Pt without nutrition x 4 days this admission (plus several days prior to admission per pt) and is malnourished. Discussed nutrition plan with attending and surgery team (who deferred decisions to attending). Ok to place PICC and begin TPN.   Discussed plan with pt, has no questions and is in agreement that he needs nutrition. Pt is hopeful to be able to continue cancer treatment after discharge.  Nutritionally Relevant Medications: Scheduled Meds:  atorvastatin  40 mg Per Tube Daily   famotidine  20 mg Per Tube Daily   fenofibrate  160 mg Per Tube Daily   folic acid  1 mg Per Tube Daily   multivitamin with  minerals  1 tablet Per Tube Daily   thiamine  100 mg Per Tube Daily   Continuous Infusions:  cefTRIAXone (ROCEPHIN)  IV 1 g (10/01/22 1633)   dextrose 5 % and 0.45 % NaCl with KCl 40 mEq/L 75 mL/hr at 10/01/22 1811   potassium PHOSPHATE IVPB (in mmol) 30 mmol (10/02/22 1053)   PRN Meds: alum & mag hydroxide-simeth, ondansetron   Labs Reviewed: Na 134 K 3.4 Phosphorus 2.3  NUTRITION - FOCUSED PHYSICAL EXAM:  Flowsheet Row Most Recent Value  Orbital Region Moderate depletion  Upper Arm Region Severe depletion  Thoracic and Lumbar Region No depletion  Buccal Region Moderate depletion  Temple Region Moderate depletion  Clavicle Bone Region Moderate depletion  Clavicle and Acromion Bone Region Mild depletion  Scapular Bone Region Mild depletion  Dorsal Hand Mild depletion  Patellar Region Severe depletion  Anterior Thigh Region Severe depletion  Posterior Calf Region Moderate depletion  Edema (RD Assessment) None  Hair Reviewed  Eyes Reviewed  Mouth Reviewed  Skin Reviewed  Nails Reviewed    Diet Order:   Diet Order             Diet NPO time specified Except for: Ice Chips, Other (See Comments)  Diet effective midnight                   EDUCATION NEEDS:   Education needs have been addressed  Skin:  Skin Assessment: Reviewed RN Assessment  Last BM:  3/1  Height:   Ht Readings from Last 1 Encounters:  09/28/22 6' (1.829 m)    Weight:  Wt Readings from Last 1 Encounters:  10/02/22 83.3 kg   BMI:  Body mass index is 24.9 kg/m.  Estimated Nutritional Needs:  Kcal:  2300-2500 Protein:  115-130g Fluid:  >/=2L   Ranell Patrick, RD, LDN Clinical Dietitian RD pager # available in AMION  After hours/weekend pager # available in [facility]

## 2022-10-03 DIAGNOSIS — K567 Ileus, unspecified: Secondary | ICD-10-CM | POA: Diagnosis not present

## 2022-10-03 LAB — COMPREHENSIVE METABOLIC PANEL
ALT: 15 U/L (ref 0–44)
AST: 24 U/L (ref 15–41)
Albumin: 1.7 g/dL — ABNORMAL LOW (ref 3.5–5.0)
Alkaline Phosphatase: 69 U/L (ref 38–126)
Anion gap: 8 (ref 5–15)
BUN: 8 mg/dL (ref 8–23)
CO2: 26 mmol/L (ref 22–32)
Calcium: 7.6 mg/dL — ABNORMAL LOW (ref 8.9–10.3)
Chloride: 97 mmol/L — ABNORMAL LOW (ref 98–111)
Creatinine, Ser: 0.71 mg/dL (ref 0.61–1.24)
GFR, Estimated: >60 mL/min (ref >60)
Glucose, Bld: 133 mg/dL — ABNORMAL HIGH (ref 70–99)
Potassium: 3.7 mmol/L (ref 3.5–5.1)
Sodium: 131 mmol/L — ABNORMAL LOW (ref 135–145)
Total Bilirubin: 0.4 mg/dL (ref 0.3–1.2)
Total Protein: 5.7 g/dL — ABNORMAL LOW (ref 6.5–8.1)

## 2022-10-03 LAB — CULTURE, BLOOD (ROUTINE X 2)
Culture: NO GROWTH
Culture: NO GROWTH
Special Requests: ADEQUATE

## 2022-10-03 LAB — PHOSPHORUS: Phosphorus: 2.1 mg/dL — ABNORMAL LOW (ref 2.5–4.6)

## 2022-10-03 LAB — TRIGLYCERIDES: Triglycerides: 108 mg/dL (ref <150)

## 2022-10-03 LAB — GLUCOSE, CAPILLARY
Glucose-Capillary: 105 mg/dL — ABNORMAL HIGH (ref 70–99)
Glucose-Capillary: 107 mg/dL — ABNORMAL HIGH (ref 70–99)
Glucose-Capillary: 112 mg/dL — ABNORMAL HIGH (ref 70–99)
Glucose-Capillary: 115 mg/dL — ABNORMAL HIGH (ref 70–99)
Glucose-Capillary: 120 mg/dL — ABNORMAL HIGH (ref 70–99)
Glucose-Capillary: 129 mg/dL — ABNORMAL HIGH (ref 70–99)

## 2022-10-03 LAB — MAGNESIUM: Magnesium: 2 mg/dL (ref 1.7–2.4)

## 2022-10-03 MED ORDER — SODIUM PHOSPHATES 45 MMOLE/15ML IV SOLN
15.0000 mmol | Freq: Once | INTRAVENOUS | Status: AC
  Administered 2022-10-03: 15 mmol via INTRAVENOUS
  Filled 2022-10-03: qty 5

## 2022-10-03 MED ORDER — SODIUM CHLORIDE 0.9 % IV SOLN: INTRAVENOUS | Status: DC

## 2022-10-03 MED ORDER — TRAVASOL 10 % IV SOLN
INTRAVENOUS | Status: AC
  Filled 2022-10-03: qty 873.6

## 2022-10-03 MED ORDER — POTASSIUM CHLORIDE 10 MEQ/100ML IV SOLN
10.0000 meq | INTRAVENOUS | Status: AC
  Administered 2022-10-03 (×2): 10 meq via INTRAVENOUS
  Filled 2022-10-03 (×2): qty 100

## 2022-10-03 MED ORDER — DEXTROSE-NACL 5-0.45 % IV SOLN: INTRAVENOUS | Status: AC

## 2022-10-03 NOTE — Progress Notes (Signed)
Pt refused to have blood drawn today. Primary bedside RN was notified of pt's refusal.

## 2022-10-03 NOTE — Progress Notes (Signed)
Subjective: Continues to have some pain, but knows this is chronic and long-term because of his cancer.  Having bowel function.    Objective: Vital signs in last 24 hours: Temp:  [98.2 F (36.8 C)-99.7 F (37.6 C)] 98.2 F (36.8 C) (03/02 0804) Pulse Rate:  [91-104] 91 (03/02 0804) Resp:  [15-18] 15 (03/02 0804) BP: (107-124)/(74-87) 116/81 (03/02 0804) SpO2:  [86 %-99 %] 99 % (03/02 0804) Weight:  [83.3 kg] 83.3 kg (03/01 1408) Last BM Date : 10/02/22  Intake/Output from previous day: 03/01 0701 - 03/02 0700 In: 81 [P.O.:240; I.V.:172] Out: 875 [Emesis/NG output:875] Intake/Output this shift: Total I/O In: 240 [P.O.:120; NG/GT:120] Out: -   PE: Gen: NAD Abd: distended, somewhat tender to palpation, but around baseline, NGT with bilious output.  Lab Results:  Recent Labs    10/01/22 0700  WBC 10.0  HGB 13.0  HCT 36.5*  PLT 304    BMET Recent Labs    10/02/22 0630 10/03/22 0857  NA 134* 131*  K 3.4* 3.7  CL 99 97*  CO2 25 26  GLUCOSE 122* 133*  BUN 12 8  CREATININE 0.81 0.71  CALCIUM 7.9* 7.6*    PT/INR No results for input(s): "LABPROT", "INR" in the last 72 hours. CMP     Component Value Date/Time   NA 131 (L) 10/03/2022 0857   NA 137 03/11/2014 1025   K 3.7 10/03/2022 0857   K 4.0 03/11/2014 1025   CL 97 (L) 10/03/2022 0857   CL 104 03/11/2014 1025   CO2 26 10/03/2022 0857   CO2 25 03/11/2014 1025   GLUCOSE 133 (H) 10/03/2022 0857   GLUCOSE 132 (H) 03/11/2014 1025   BUN 8 10/03/2022 0857   BUN 15 03/11/2014 1025   CREATININE 0.71 10/03/2022 0857   CREATININE 1.06 12/01/2019 1627   CALCIUM 7.6 (L) 10/03/2022 0857   CALCIUM 8.6 03/11/2014 1025   PROT 5.7 (L) 10/03/2022 0857   PROT 7.1 02/23/2018 1627   ALBUMIN 1.7 (L) 10/03/2022 0857   AST 24 10/03/2022 0857   ALT 15 10/03/2022 0857   ALKPHOS 69 10/03/2022 0857   BILITOT 0.4 10/03/2022 0857   GFRNONAA >60 10/03/2022 0857   GFRNONAA >60 03/11/2014 1025   GFRAA >60  01/11/2020 1609   GFRAA >60 03/11/2014 1025   Lipase     Component Value Date/Time   LIPASE 24 09/28/2022 1129       Studies/Results: DG Chest Port 1 View  Result Date: 10/02/2022 CLINICAL DATA:  Central line placement EXAM: PORTABLE CHEST 1 VIEW COMPARISON:  09/28/2022 FINDINGS: Multifocal patchy opacities in the lungs bilaterally, upper lung predominant, suspicious for multifocal pneumonia. This is progressive. No pleural effusion or pneumothorax. The heart is normal in size. Right chest power port terminates at the cavoatrial junction. Enteric tube courses into the distal stomach. IMPRESSION: Multifocal patchy opacities in the lungs bilaterally, suspicious for multifocal pneumonia, progressive. Right chest power port terminates at the cavoatrial junction. Enteric tube courses into the distal stomach. Electronically Signed   By: Julian Hy M.D.   On: 10/02/2022 17:46   [place] EKG SITE RITE  Result Date: 10/02/2022 If Site Rite image not attached, placement could not be confirmed due to current cardiac rhythm.  DG Abd Portable 1V  Result Date: 10/02/2022 CLINICAL DATA:  Ileus EXAM: PORTABLE ABDOMEN - 1 VIEW COMPARISON:  Yesterday FINDINGS: Nasogastric tube terminates at the distal stomach. Moderate small bowel dilatation at up to 4.9 cm. Similar bowel  loop of 4.4 cm on the prior. Normal caliber, gas and stool filled colon. Contrast within the rectum. No gross free intraperitoneal air. IMPRESSION: Progressive small bowel dilatation, favoring partial small bowel obstruction over adynamic ileus. Electronically Signed   By: Abigail Miyamoto M.D.   On: 10/02/2022 08:10   DG Abd Portable 1V  Result Date: 10/01/2022 CLINICAL DATA:  Iliacs.  Abdominal pain. EXAM: PORTABLE ABDOMEN - 1 VIEW COMPARISON:  09/30/2022 FINDINGS: Enteric tube tip overlies the level of the descending portion of the duodenum. There has been some interval improvement in dilatation of small bowel loops. Persistent dilatation of  both small and large bowel loops is noted. Contrast is identified within the rectum previous contrast study. No evidence for free intraperitoneal air. IMPRESSION: 1. Enteric tube tip overlies the descending portion of the duodenum. 2. Persistent dilatation of both small and large bowel loops, with some interval improvement. Electronically Signed   By: Nolon Nations M.D.   On: 10/01/2022 18:03    Anti-infectives: Anti-infectives (From admission, onward)    Start     Dose/Rate Route Frequency Ordered Stop   09/28/22 1645  cefTRIAXone (ROCEPHIN) 1 g in sodium chloride 0.9 % 100 mL IVPB  Status:  Discontinued        1 g 200 mL/hr over 30 Minutes Intravenous Every 24 hours 09/28/22 1643 10/03/22 0730        Assessment/Plan History of colon cancer with peritoneal carcinomatosis, suspected ileus vs psbo -followed by [facility] for this condition -x-rays yesterday show worsening SB dilatation, but he still have air and contrast in his colon.  This is c/w ileus vs psbo.  I do not think he is completely blocked as he continued to have some bowel function - unclear given his carcinomatosis whether this is something that will ultimately resolve or not.  If this is just an ileus, there is no surgical correction.  With that being said, even if this is a psbo, given his carcinomatosis, not sure that he would be surgical candidate. - pt reports he is feeling better and having less pain overall today - trial NGT clamp and CLD. If he becomes nauseated or has worsening pain reconnect to LIWS  - Would recommend K >4 and Mg>2 to help with bowel function.  FEN - CLD, NGT clamped VTE - plavix, heparin ID - rocephin for UTI  I reviewed hospitalist notes, last 24 h vitals and pain scores, last 48 h intake and output, last 24 h labs and trends, and last 24 h imaging results.   LOS: 5 days    Norm Parcel , [facility] 10/03/2022, 11:26 AM Please see Amion for pager number during day hours  7:00am-4:30pm or 7:00am -11:30am on weekends

## 2022-10-03 NOTE — Progress Notes (Signed)
   Medical records reviewed including progress notes, labs, imaging.  Continuing clamping trials for ileus versus possible small bowel obstruction.   Patient has been very clear that his goal is to resume chemotherapy in the outpatient setting.  He wishes for full code/full scope treatment.  He has been encouraged to contact PMT with additional needs or concerns.  PMT will continue to follow peripherally.  Thank you for your referral and allowing PMT to assist in [name].'s care.   Dorthy Cooler, [facility] Palliative Medicine Team  Team Phone # [phone]   NO CHARGE

## 2022-10-03 NOTE — Progress Notes (Signed)
PROGRESS NOTE    Patrick Bailey.  ZC:[id number] DOB: [birth year] DOA: 09/28/2022 PCP: Ria Bush, MD   Brief Narrative:    Patrick Condray. is an 62 y.o. male past medical history significant for essential hypertension, right MCA stroke, anxiety, and depression, status post right hemicolectomy in 2023, recently diagnosed with peritoneal carcinomatosis on 1/24 on chemotherapy comes in complaining of abdominal pain that started 1 week prior to admission due to chemotherapy.  Has been having chronic diarrhea.  He is on FOLFOX started on January this year.  Imaging concerning for ileus.  Seen by general surgery.   He is currently being conservatively managed for partial small bowel obstruction versus ileus.  Assessment & Plan:   Principal Problem:   Ileus ([place]) Active Problems:   SIRS (systemic inflammatory response syndrome) (HCC)   Lactic acidosis   UTI (urinary tract infection)   AKI (acute kidney injury) ([place])   Malignant neoplasm of appendix (HCC)   Hypokalemia   Hypomagnesemia   Essential hypertension   Hypothyroidism   History of alcohol abuse   GERD (gastroesophageal reflux disease)   Malnutrition of moderate degree  Assessment and Plan:   Abdominal pain likely due to ileus. General surgery following Continue to monitor NG output. Optimize magnesium and potassium levels Continue IV fluid hydration   Moderate protein calorie malnutrition Start TPN which patient is receiving through port Obtain prealbumin level in the morning.   Resolved: SIRS/lactic acidosis: Likely due to decreased oral intake this resolved with resuscitation.   Possible UTI Blood cultures have remained negative to date.   Completed treatment, discontinue Rocephin   Resolved: Acute kidney injury: With a baseline creatinine of less than 1 on admission to started on fluid resuscitation, is now back to baseline. Likely prerenal azotemia.   Refractory hypophosphatemia Serum  phosphorus 1.3>> 2.3 Repleted intravenously.   Peritoneal carcinomatosis of the appendix: On chemotherapy last 1 on 08/20/2022. Has a follow-up at [facility] with Dr. Junius Creamer  Palliative care was consulted.  He has a very poor prognosis. Pain management in place.   Hypokalemia/hypomagnesemia: Replete electrolytes as indicated.   Essential hypertension: Continue metoprolol IV. Monitor vital signs   Sinus tachycardia: IV Lopressor. Monitor vital signs   Hypothyroidism: With a TSH of 23 Free T40.8, his thyroxine was increased to 175 micrograms per day. Patient educated on taking it on empty stomach.   History of alcohol abuse: Denies any alcohol consumption.    DVT prophylaxis:Heparin Code Status: Full Family Communication: Discussed with sister on phone 3/2 Disposition Plan:  Status is: Inpatient Remains inpatient appropriate because: Need for IV medications.  Consultants:  Gen Surg  Procedures:  None  Antimicrobials:  Anti-infectives (From admission, onward)    Start     Dose/Rate Route Frequency Ordered Stop   09/28/22 1645  cefTRIAXone (ROCEPHIN) 1 g in sodium chloride 0.9 % 100 mL IVPB        1 g 200 mL/hr over 30 Minutes Intravenous Every 24 hours 09/28/22 1643         Subjective: Patient seen and evaluated today with no new acute complaints or concerns. No acute concerns or events noted overnight.  He states that he is passing flatus and has had 2 bowel movements yesterday.  No significant abdominal pain today.  Objective: Vitals:   10/03/22 0113 10/03/22 0119 10/03/22 0122 10/03/22 0625  BP: 107/80   122/79  Pulse: (!) 101 92 92 99  Resp: 16   18  Temp:  99.5 F (37.5 C)   99 F (37.2 C)  TempSrc: Oral   Oral  SpO2: 94% (!) 86% 95% (!) 88%  Weight:      Height:        Intake/Output Summary (Last 24 hours) at 10/03/2022 0728 Last data filed at 10/03/2022 0600 Gross per 24 hour  Intake 172.03 ml  Output 875 ml  Net -702.97 ml    Filed Weights   09/28/22 1120 09/29/22 0500 10/02/22 1408  Weight: 87.1 kg 86 kg 83.3 kg    Examination:  General exam: Appears calm and comfortable  Respiratory system: Clear to auscultation. Respiratory effort normal. Cardiovascular system: S1 & S2 heard, RRR.  Gastrointestinal system: Abdomen is soft, NG tube to suction Central nervous system: Alert and awake Extremities: No edema Skin: No significant lesions noted Psychiatry: Flat affect.    Data Reviewed: I have personally reviewed following labs and imaging studies  CBC: Recent Labs  Lab 09/28/22 1129 09/28/22 1153 09/29/22 0157 10/01/22 0700  WBC 6.1  --  5.8 10.0  NEUTROABS 4.1  --   --  7.4  HGB 13.7 13.9 12.1* 13.0  HCT 38.6* 41.0 33.2* 36.5*  MCV 93.2  --  90.5 92.2  PLT 258  --  234 [id number]   Basic Metabolic Panel: Recent Labs  Lab 09/28/22 1129 09/28/22 1153 09/29/22 0157 09/30/22 0158 10/01/22 0700 10/02/22 0630  NA 131* 131* 129* 132* 133* 134*  K 3.2* 3.2* 3.0* 3.6 3.8 3.4*  CL 98 100 97* 99 98 99  CO2 17*  --  20* 21* 26 25  GLUCOSE 145* 141* 127* 131* 114* 122*  BUN 27* 30* 25* '21 15 12  '$ CREATININE 1.99* 1.90* 0.92 1.01 1.00 0.81  CALCIUM 9.1  --  8.2* 8.2* 8.5* 7.9*  MG 1.6*  --  2.0 2.2 2.2  --   PHOS  --   --   --   --  1.3* 2.3*   GFR: Estimated Creatinine Clearance: 103.8 mL/min (by C-G formula based on SCr of 0.81 mg/dL). Liver Function Tests: Recent Labs  Lab 09/28/22 1129 10/01/22 0700  AST 31 30  ALT 12 15  ALKPHOS 50 94  BILITOT 0.9 0.8  PROT 6.1* 6.6  ALBUMIN 2.4* 2.2*   Recent Labs  Lab 09/28/22 1129  LIPASE 24   No results for input(s): "AMMONIA" in the last 168 hours. Coagulation Profile: No results for input(s): "INR", "PROTIME" in the last 168 hours. Cardiac Enzymes: No results for input(s): "CKTOTAL", "CKMB", "CKMBINDEX", "TROPONINI" in the last 168 hours. BNP (last 3 results) No results for input(s): "PROBNP" in the last 8760 hours. HbA1C: No results  for input(s): "HGBA1C" in the last 72 hours. CBG: Recent Labs  Lab 10/02/22 2012 10/03/22 0008 10/03/22 0414  GLUCAP 126* 115* 120*   Lipid Profile: No results for input(s): "CHOL", "HDL", "LDLCALC", "TRIG", "CHOLHDL", "LDLDIRECT" in the last 72 hours. Thyroid Function Tests: No results for input(s): "TSH", "T4TOTAL", "FREET4", "T3FREE", "THYROIDAB" in the last 72 hours. Anemia Panel: No results for input(s): "VITAMINB12", "FOLATE", "FERRITIN", "TIBC", "IRON", "RETICCTPCT" in the last 72 hours. Sepsis Labs: Recent Labs  Lab 09/28/22 1129 09/28/22 1349 09/28/22 1518 09/28/22 1758  LATICACIDVEN 5.2* 3.7* 2.0* 1.9    Recent Results (from the past 240 hour(s))  Blood culture (routine x 2)     Status: None (Preliminary result)   Collection Time: 09/28/22 11:29 AM   Specimen: BLOOD RIGHT FOREARM  Result Value Ref Range Status   Specimen Description  BLOOD RIGHT FOREARM  Final   Special Requests   Final    BOTTLES DRAWN AEROBIC AND ANAEROBIC Blood Culture adequate volume   Culture   Final    NO GROWTH 4 DAYS Performed at [facility], [address] [place], [place] [address]    Report Status PENDING  Incomplete  Blood culture (routine x 2)     Status: None (Preliminary result)   Collection Time: 09/28/22 11:40 AM   Specimen: BLOOD  Result Value Ref Range Status   Specimen Description BLOOD LEFT ANTECUBITAL  Final   Special Requests   Final    BOTTLES DRAWN AEROBIC AND ANAEROBIC Blood Culture results may not be optimal due to an excessive volume of blood received in culture bottles   Culture   Final    NO GROWTH 4 DAYS Performed at [facility], [place] [address], [place], [place] [address]    Report Status PENDING  Incomplete         Radiology Studies: DG Chest Port 1 View  Result Date: 10/02/2022 CLINICAL DATA:  Central line placement EXAM: PORTABLE CHEST 1 VIEW COMPARISON:  09/28/2022 FINDINGS: Multifocal patchy opacities in the lungs bilaterally, upper  lung predominant, suspicious for multifocal pneumonia. This is progressive. No pleural effusion or pneumothorax. The heart is normal in size. Right chest power port terminates at the cavoatrial junction. Enteric tube courses into the distal stomach. IMPRESSION: Multifocal patchy opacities in the lungs bilaterally, suspicious for multifocal pneumonia, progressive. Right chest power port terminates at the cavoatrial junction. Enteric tube courses into the distal stomach. Electronically Signed   By: Julian Hy M.D.   On: 10/02/2022 17:46   [place] EKG SITE RITE  Result Date: 10/02/2022 If Site Rite image not attached, placement could not be confirmed due to current cardiac rhythm.  DG Abd Portable 1V  Result Date: 10/02/2022 CLINICAL DATA:  Ileus EXAM: PORTABLE ABDOMEN - 1 VIEW COMPARISON:  Yesterday FINDINGS: Nasogastric tube terminates at the distal stomach. Moderate small bowel dilatation at up to 4.9 cm. Similar bowel loop of 4.4 cm on the prior. Normal caliber, gas and stool filled colon. Contrast within the rectum. No gross free intraperitoneal air. IMPRESSION: Progressive small bowel dilatation, favoring partial small bowel obstruction over adynamic ileus. Electronically Signed   By: Abigail Miyamoto M.D.   On: 10/02/2022 08:10   DG Abd Portable 1V  Result Date: 10/01/2022 CLINICAL DATA:  Iliacs.  Abdominal pain. EXAM: PORTABLE ABDOMEN - 1 VIEW COMPARISON:  09/30/2022 FINDINGS: Enteric tube tip overlies the level of the descending portion of the duodenum. There has been some interval improvement in dilatation of small bowel loops. Persistent dilatation of both small and large bowel loops is noted. Contrast is identified within the rectum previous contrast study. No evidence for free intraperitoneal air. IMPRESSION: 1. Enteric tube tip overlies the descending portion of the duodenum. 2. Persistent dilatation of both small and large bowel loops, with some interval improvement. Electronically Signed   By:  Nolon Nations M.D.   On: 10/01/2022 18:03        Scheduled Meds:  amitriptyline  50 mg Per Tube q AM   amLODipine  10 mg Per Tube Daily   atorvastatin  40 mg Per Tube Daily   Chlorhexidine Gluconate Cloth  6 each Topical Daily   clopidogrel  75 mg Per Tube Daily   fenofibrate  160 mg Per Tube Daily   gabapentin  400 mg Per Tube TID   heparin  5,000 Units Subcutaneous Q8H   insulin aspart  0-9 Units Subcutaneous Q4H   levothyroxine  175 mcg Per Tube QAC breakfast   metoprolol tartrate  5 mg Intravenous Q6H   sodium chloride flush  10-40 mL Intracatheter Q12H   sodium chloride flush  3 mL Intravenous Q12H   Continuous Infusions:  sodium chloride 30 mL/hr at 10/02/22 1727   cefTRIAXone (ROCEPHIN)  IV 1 g (10/02/22 1736)   dextrose 5 % and 0.45 % NaCl with KCl 40 mEq/L 75 mL/hr at 10/03/22 0648   TPN ADULT (ION) 45 mL/hr at 10/02/22 1827     LOS: 5 days    Time spent: 35 minutes    Patrick Bailey Darleen Crocker, DO [facility]  If 7PM-7AM, please contact night-coverage [email or web address] 10/03/2022, 7:28 AM

## 2022-10-03 NOTE — Progress Notes (Signed)
PHARMACY - TOTAL PARENTERAL NUTRITION CONSULT NOTE   Indication: Prolonged ileus, malnutrition  Patient Measurements: Height: 6' (182.9 cm) Weight: 83.3 kg (183 lb 9.6 oz) IBW/kg (Calculated) : 77.6 TPN AdjBW (KG): 87.1 Body mass index is 24.9 kg/m. Usual Weight: 89-90kg  Assessment: 62 yo male presented with abdominal pain and hypotension. 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 Patient was placed on bowel rest and NG tube was placed on admission.  Glucose / Insulin: no hx DM, sensitive SSI ordered. Received 2 units since TPN initiation. CBGs 120-133. Electrolytes: K 3.7 (s/p Kphos + Kcl fluids), Na 131, Phos 2.1 (s/p Kphos), Mg 2, CoCa 9.4 - Keep K > 4, Mg > 2 Renal: Scr 0.71, BUN 8 (appears stable) Hepatic: LFTs, Tbili, alk phos WNL; albumin 1.7 (low) Intake / Output; MIVF: D5-1/2NS + Kcl @ 16m/hr >> adjusted to D5-1/2NS @ 363mhr; 1075 mL NG output yesterday, no UOP quantified but x1 occurence, sips of liquids for comfort. LBM 3/1 GI Imaging: - 3/1 Abd x-ray - progressive small bowel dilation, favoring pSBO GI Surgeries / Procedures:  - None this admission  Central access: PAC  TPN start date: 3/1  Nutritional Goals: Goal TPN rate is 95 mL/hr (provides 118 g of protein and 2304 kcals per day)  RD Assessment: Estimated Needs Total Energy Estimated Needs: 2300-2500 Total Protein Estimated Needs: 115-130g Total Fluid Estimated Needs: >/=2L  Current Nutrition:  NPO except for ice chips/sips of clears TPN  Plan:  Increase TPN to 705mr at 1800 (will provide 87g AA and 1697 kcal meeting ~75% of patients needs) Electrolytes in TPN: Na 56m13m, K 40mE25m Ca 5mEq/45mMg 5mEq/L18mnd Phos 15mmol/36ml:Ac 1:1 - increase in TPN rate will increase overall electrolyte amounts Give sodium phosphate 15mmol I65m, Kcl IV x2 runs Add standard MVI and trace elements  to TPN Continue Sensitive q4h SSI and adjust as needed  Reduce MIVF to NS at 30 mL/hr at 1800 Monitor TPN labs on Mon/Thurs and additionally as needed Patient has been getting thiamine per tube - unsure how well he is absorbing so will add thiamine to TPN for 5 days given refeeding risk (day 2) Add folic acid and famotidine to TPN as well (d/w CCS and prefer IV meds/in TPN if possible)   Mary-AshlDimple Nanas BCPS 10/03/2022 8:26 AM

## 2022-10-04 DIAGNOSIS — K567 Ileus, unspecified: Secondary | ICD-10-CM | POA: Diagnosis not present

## 2022-10-04 LAB — GLUCOSE, CAPILLARY
Glucose-Capillary: 110 mg/dL — ABNORMAL HIGH (ref 70–99)
Glucose-Capillary: 118 mg/dL — ABNORMAL HIGH (ref 70–99)
Glucose-Capillary: 123 mg/dL — ABNORMAL HIGH (ref 70–99)
Glucose-Capillary: 124 mg/dL — ABNORMAL HIGH (ref 70–99)
Glucose-Capillary: 127 mg/dL — ABNORMAL HIGH (ref 70–99)

## 2022-10-04 LAB — PHOSPHORUS: Phosphorus: 3.2 mg/dL (ref 2.5–4.6)

## 2022-10-04 LAB — COMPREHENSIVE METABOLIC PANEL
ALT: 13 U/L (ref 0–44)
AST: 25 U/L (ref 15–41)
Albumin: 2 g/dL — ABNORMAL LOW (ref 3.5–5.0)
Alkaline Phosphatase: 78 U/L (ref 38–126)
Anion gap: 8 (ref 5–15)
BUN: 11 mg/dL (ref 8–23)
CO2: 26 mmol/L (ref 22–32)
Calcium: 8.3 mg/dL — ABNORMAL LOW (ref 8.9–10.3)
Chloride: 97 mmol/L — ABNORMAL LOW (ref 98–111)
Creatinine, Ser: 0.83 mg/dL (ref 0.61–1.24)
GFR, Estimated: >60 mL/min (ref >60)
Glucose, Bld: 119 mg/dL — ABNORMAL HIGH (ref 70–99)
Potassium: 4 mmol/L (ref 3.5–5.1)
Sodium: 131 mmol/L — ABNORMAL LOW (ref 135–145)
Total Bilirubin: 0.5 mg/dL (ref 0.3–1.2)
Total Protein: 6.3 g/dL — ABNORMAL LOW (ref 6.5–8.1)

## 2022-10-04 LAB — CBC
HCT: 30 % — ABNORMAL LOW (ref 39.0–52.0)
Hemoglobin: 10.4 g/dL — ABNORMAL LOW (ref 13.0–17.0)
MCH: 32.6 pg (ref 26.0–34.0)
MCHC: 34.7 g/dL (ref 30.0–36.0)
MCV: 94 fL (ref 80.0–100.0)
Platelets: 277 10*3/uL (ref 150–400)
RBC: 3.19 MIL/uL — ABNORMAL LOW (ref 4.22–5.81)
RDW: 14.9 % (ref 11.5–15.5)
WBC: 12.1 10*3/uL — ABNORMAL HIGH (ref 4.0–10.5)
nRBC: 0 % (ref 0.0–0.2)

## 2022-10-04 LAB — MAGNESIUM: Magnesium: 2 mg/dL (ref 1.7–2.4)

## 2022-10-04 MED ORDER — TRAVASOL 10 % IV SOLN: INTRAVENOUS | Status: DC

## 2022-10-04 MED ORDER — TRAVASOL 10 % IV SOLN
INTRAVENOUS | Status: DC
  Filled 2022-10-04: qty 1185.6

## 2022-10-04 NOTE — Progress Notes (Signed)
   10/04/22 1000  Mobility  Activity Ambulated with assistance in hallway  Level of Assistance Modified independent, requires aide device or extra time  Assistive Device  (IV Pole)  Activity Response Tolerated well  Mobility Referral Yes  $Mobility charge 1 Mobility   Mobility Specialist Progress Note  Pt was in bed and agreeable. Had no c/o pain throughout. Returned to bed w/ all needs met and call bell in reach.   Lucious Groves Mobility Specialist  Please contact via SecureChat or Rehab office at [phone]

## 2022-10-04 NOTE — Progress Notes (Signed)
   Medical records reviewed including progress notes, labs, imaging. Patient is improving. His goal is to resume chemotherapy in the outpatient setting and he has been clear on his desire for full code/full scope treatment.     Patient is agreeable to outpatient palliative care follow up and TOC has been consulted for a referral.  PMT will sign off at this time.  Thank you for your referral and allowing PMT to assist in [name]'s care.   Dorthy Cooler, [facility] Palliative Medicine Team  Team Phone # [phone]   NO CHARGE

## 2022-10-04 NOTE — Progress Notes (Signed)
NG tube kept clamped overnight. Pt denied any nausea overnight. Did request zofran with IV dilaudid to prevent nausea. Drinking ginger ale overnight, tolerating well.   Reports he had 2 bowel movements this morning that "felt more like my normal". Reports feeling better overall.

## 2022-10-04 NOTE — Progress Notes (Signed)
O2 sats fluctuating from 86-92% RA when starting to sleep. Placed on O2 1Lnc while sleeping to keep sats >90%.

## 2022-10-04 NOTE — Progress Notes (Signed)
PROGRESS NOTE    Patrick Bailey.  ZC:[id number] DOB: January 07, 1961 DOA: 09/28/2022 PCP: Ria Bush, MD   Brief Narrative:    Patrick Bailey. is an 62 y.o. male past medical history significant for essential hypertension, right MCA stroke, anxiety, and depression, status post right hemicolectomy in 2023, recently diagnosed with peritoneal carcinomatosis on 1/24 on chemotherapy comes in complaining of abdominal pain that started 1 week prior to admission due to chemotherapy.  Has been having chronic diarrhea.  He is on FOLFOX started on January this year.  Imaging concerning for ileus.  Seen by general surgery.   He is currently being conservatively managed for partial small bowel obstruction versus ileus.  Assessment & Plan:   Principal Problem:   Ileus ([place]) Active Problems:   SIRS (systemic inflammatory response syndrome) (HCC)   Lactic acidosis   UTI (urinary tract infection)   AKI (acute kidney injury) ([place])   Malignant neoplasm of appendix (HCC)   Hypokalemia   Hypomagnesemia   Essential hypertension   Hypothyroidism   History of alcohol abuse   GERD (gastroesophageal reflux disease)   Malnutrition of moderate degree  Assessment and Plan:   Abdominal pain likely due to ileus. General surgery following Now tolerating clears and NGT clamped. Hope to advance diet further today and remove NGT soon. Optimize magnesium and potassium levels Continue IV fluid hydration   Moderate protein calorie malnutrition Start TPN which patient is receiving through port Obtain prealbumin level in the morning.   Resolved: SIRS/lactic acidosis: Likely due to decreased oral intake this resolved with resuscitation.   Possible UTI Blood cultures have remained negative to date.   Completed treatment, discontinue Rocephin   Resolved: Acute kidney injury: With a baseline creatinine of less than 1 on admission to started on fluid resuscitation, is now back to baseline. Likely  prerenal azotemia.   Refractory hypophosphatemia Serum phosphorus 1.3>> 2.3 Repleted intravenously.   Peritoneal carcinomatosis of the appendix: On chemotherapy last 1 on 08/20/2022. Has a follow-up at [facility] with Dr. Junius Creamer  Palliative care was consulted.  He has a very poor prognosis. Pain management in place.   Hypokalemia/hypomagnesemia: Replete electrolytes as indicated.   Essential hypertension: Continue metoprolol IV. Monitor vital signs   Sinus tachycardia: IV Lopressor. Monitor vital signs   Hypothyroidism: With a TSH of 23 Free T40.8, his thyroxine was increased to 175 micrograms per day. Patient educated on taking it on empty stomach.   History of alcohol abuse: Denies any alcohol consumption.    DVT prophylaxis:Heparin Code Status: Full Family Communication: Discussed with sister on phone 3/3 Disposition Plan:  Status is: Inpatient Remains inpatient appropriate because: Need for IV medications.  Consultants:  Gen Surg  Procedures:  None  Antimicrobials:  Anti-infectives (From admission, onward)    Start     Dose/Rate Route Frequency Ordered Stop   09/28/22 1645  cefTRIAXone (ROCEPHIN) 1 g in sodium chloride 0.9 % 100 mL IVPB  Status:  Discontinued        1 g 200 mL/hr over 30 Minutes Intravenous Every 24 hours 09/28/22 1643 10/03/22 0730       Subjective: Patient seen and evaluated today with no new acute complaints or concerns. No acute concerns or events noted overnight.  He is tolerating clear liquids overnight and would like to have diet advanced.  Objective: Vitals:   10/04/22 0051 10/04/22 0300 10/04/22 0500 10/04/22 0507  BP: 127/84   121/80  Pulse:  90  98  Resp:    16  Temp:    98.5 F (36.9 C)  TempSrc:    Oral  SpO2:  92%  92%  Weight:   85.7 kg   Height:        Intake/Output Summary (Last 24 hours) at 10/04/2022 0750 Last data filed at 10/04/2022 0400 Gross per 24 hour  Intake 2068.03 ml  Output 0 ml  Net  2068.03 ml   Filed Weights   09/29/22 0500 10/02/22 1408 10/04/22 0500  Weight: 86 kg 83.3 kg 85.7 kg    Examination:  General exam: Appears calm and comfortable  Respiratory system: Clear to auscultation. Respiratory effort normal. Cardiovascular system: S1 & S2 heard, RRR.  Gastrointestinal system: Abdomen is soft, NG tube to suction Central nervous system: Alert and awake Extremities: No edema Skin: No significant lesions noted Psychiatry: Flat affect.    Data Reviewed: I have personally reviewed following labs and imaging studies  CBC: Recent Labs  Lab 09/28/22 1129 09/28/22 1153 09/29/22 0157 10/01/22 0700  WBC 6.1  --  5.8 10.0  NEUTROABS 4.1  --   --  7.4  HGB 13.7 13.9 12.1* 13.0  HCT 38.6* 41.0 33.2* 36.5*  MCV 93.2  --  90.5 92.2  PLT 258  --  234 [id number]   Basic Metabolic Panel: Recent Labs  Lab 09/28/22 1129 09/28/22 1153 09/29/22 0157 09/30/22 0158 10/01/22 0700 10/02/22 0630 10/03/22 0857  NA 131*   < > 129* 132* 133* 134* 131*  K 3.2*   < > 3.0* 3.6 3.8 3.4* 3.7  CL 98   < > 97* 99 98 99 97*  CO2 17*  --  20* 21* '26 25 26  '$ GLUCOSE 145*   < > 127* 131* 114* 122* 133*  BUN 27*   < > 25* '21 15 12 8  '$ CREATININE 1.99*   < > 0.92 1.01 1.00 0.81 0.71  CALCIUM 9.1  --  8.2* 8.2* 8.5* 7.9* 7.6*  MG 1.6*  --  2.0 2.2 2.2  --  2.0  PHOS  --   --   --   --  1.3* 2.3* 2.1*   < > = values in this interval not displayed.   GFR: Estimated Creatinine Clearance: 105.1 mL/min (by C-G formula based on SCr of 0.71 mg/dL). Liver Function Tests: Recent Labs  Lab 09/28/22 1129 10/01/22 0700 10/03/22 0857  AST '31 30 24  '$ ALT '12 15 15  '$ ALKPHOS 50 94 69  BILITOT 0.9 0.8 0.4  PROT 6.1* 6.6 5.7*  ALBUMIN 2.4* 2.2* 1.7*   Recent Labs  Lab 09/28/22 1129  LIPASE 24   No results for input(s): "AMMONIA" in the last 168 hours. Coagulation Profile: No results for input(s): "INR", "PROTIME" in the last 168 hours. Cardiac Enzymes: No results for input(s):  "CKTOTAL", "CKMB", "CKMBINDEX", "TROPONINI" in the last 168 hours. BNP (last 3 results) No results for input(s): "PROBNP" in the last 8760 hours. HbA1C: No results for input(s): "HGBA1C" in the last 72 hours. CBG: Recent Labs  Lab 10/03/22 1202 10/03/22 1635 10/03/22 1923 10/04/22 0000 10/04/22 0500  GLUCAP 107* 105* 112* 127* 110*   Lipid Profile: Recent Labs    10/03/22 0857  TRIG 108   Thyroid Function Tests: No results for input(s): "TSH", "T4TOTAL", "FREET4", "T3FREE", "THYROIDAB" in the last 72 hours. Anemia Panel: No results for input(s): "VITAMINB12", "FOLATE", "FERRITIN", "TIBC", "IRON", "RETICCTPCT" in the last 72 hours. Sepsis Labs: Recent Labs  Lab 09/28/22 1129 09/28/22 1349 09/28/22 1518  09/28/22 1758  LATICACIDVEN 5.2* 3.7* 2.0* 1.9    Recent Results (from the past 240 hour(s))  Blood culture (routine x 2)     Status: None   Collection Time: 09/28/22 11:29 AM   Specimen: BLOOD RIGHT FOREARM  Result Value Ref Range Status   Specimen Description BLOOD RIGHT FOREARM  Final   Special Requests   Final    BOTTLES DRAWN AEROBIC AND ANAEROBIC Blood Culture adequate volume   Culture   Final    NO GROWTH 5 DAYS Performed at [facility], [address] [place], [place] [address]    Report Status 10/03/2022 FINAL  Final  Blood culture (routine x 2)     Status: None   Collection Time: 09/28/22 11:40 AM   Specimen: BLOOD  Result Value Ref Range Status   Specimen Description BLOOD LEFT ANTECUBITAL  Final   Special Requests   Final    BOTTLES DRAWN AEROBIC AND ANAEROBIC Blood Culture results may not be optimal due to an excessive volume of blood received in culture bottles   Culture   Final    NO GROWTH 5 DAYS Performed at [facility], [place] [address] [place], [place] [address]    Report Status 10/03/2022 FINAL  Final         Radiology Studies: DG Chest Port 1 View  Result Date: 10/02/2022 CLINICAL DATA:  Central line placement EXAM:  PORTABLE CHEST 1 VIEW COMPARISON:  09/28/2022 FINDINGS: Multifocal patchy opacities in the lungs bilaterally, upper lung predominant, suspicious for multifocal pneumonia. This is progressive. No pleural effusion or pneumothorax. The heart is normal in size. Right chest power port terminates at the cavoatrial junction. Enteric tube courses into the distal stomach. IMPRESSION: Multifocal patchy opacities in the lungs bilaterally, suspicious for multifocal pneumonia, progressive. Right chest power port terminates at the cavoatrial junction. Enteric tube courses into the distal stomach. Electronically Signed   By: Julian Hy M.D.   On: 10/02/2022 17:46   [place] EKG SITE RITE  Result Date: 10/02/2022 If Site Rite image not attached, placement could not be confirmed due to current cardiac rhythm.       Scheduled Meds:  amitriptyline  50 mg Per Tube q AM   amLODipine  10 mg Per Tube Daily   atorvastatin  40 mg Per Tube Daily   Chlorhexidine Gluconate Cloth  6 each Topical Daily   clopidogrel  75 mg Per Tube Daily   fenofibrate  160 mg Per Tube Daily   gabapentin  400 mg Per Tube TID   heparin  5,000 Units Subcutaneous Q8H   insulin aspart  0-9 Units Subcutaneous Q4H   levothyroxine  175 mcg Per Tube QAC breakfast   metoprolol tartrate  5 mg Intravenous Q6H   sodium chloride flush  10-40 mL Intracatheter Q12H   sodium chloride flush  3 mL Intravenous Q12H   Continuous Infusions:  sodium chloride 30 mL/hr at 10/03/22 2122   TPN ADULT (ION) 70 mL/hr at 10/03/22 1723     LOS: 6 days    Time spent: 35 minutes    Alizae Bechtel Darleen Crocker, DO [facility]  If 7PM-7AM, please contact night-coverage [email or web address] 10/04/2022, 7:50 AM

## 2022-10-04 NOTE — Progress Notes (Signed)
Subjective: Tolerating CLD with NGT clamped and having bowel function. Refuses for NGT to be removed this AM.    Objective: Vital signs in last 24 hours: Temp:  [98.1 F (36.7 C)-99.5 F (37.5 C)] 98.1 F (36.7 C) (03/03 0831) Pulse Rate:  [90-106] 105 (03/03 0831) Resp:  [14-16] 14 (03/03 0831) BP: (92-127)/(68-92) 122/68 (03/03 0831) SpO2:  [91 %-96 %] 92 % (03/03 0507) Weight:  [85.7 kg] 85.7 kg (03/03 0500) Last BM Date : 10/03/22 (per pt)  Intake/Output from previous day: 03/02 0701 - 03/03 0700 In: 2068 [P.O.:840; I.V.:1048; NG/GT:180] Out: 0  Intake/Output this shift: No intake/output data recorded.  PE: Gen: NAD Abd: distended, somewhat tender to palpation, but around baseline  Lab Results:  Recent Labs    10/04/22 0845  WBC 12.1*  HGB 10.4*  HCT 30.0*  PLT 277    BMET Recent Labs    10/03/22 0857 10/04/22 0845  NA 131* 131*  K 3.7 4.0  CL 97* 97*  CO2 26 26  GLUCOSE 133* 119*  BUN 8 11  CREATININE 0.71 0.83  CALCIUM 7.6* 8.3*    PT/INR No results for input(s): "LABPROT", "INR" in the last 72 hours. CMP     Component Value Date/Time   NA 131 (L) 10/04/2022 0845   NA 137 03/11/2014 1025   K 4.0 10/04/2022 0845   K 4.0 03/11/2014 1025   CL 97 (L) 10/04/2022 0845   CL 104 03/11/2014 1025   CO2 26 10/04/2022 0845   CO2 25 03/11/2014 1025   GLUCOSE 119 (H) 10/04/2022 0845   GLUCOSE 132 (H) 03/11/2014 1025   BUN 11 10/04/2022 0845   BUN 15 03/11/2014 1025   CREATININE 0.83 10/04/2022 0845   CREATININE 1.06 12/01/2019 1627   CALCIUM 8.3 (L) 10/04/2022 0845   CALCIUM 8.6 03/11/2014 1025   PROT 6.3 (L) 10/04/2022 0845   PROT 7.1 02/23/2018 1627   ALBUMIN 2.0 (L) 10/04/2022 0845   AST 25 10/04/2022 0845   ALT 13 10/04/2022 0845   ALKPHOS 78 10/04/2022 0845   BILITOT 0.5 10/04/2022 0845   GFRNONAA >60 10/04/2022 0845   GFRNONAA >60 03/11/2014 1025   GFRAA >60 01/11/2020 1609   GFRAA >60 03/11/2014 1025   Lipase      Component Value Date/Time   LIPASE 24 09/28/2022 1129       Studies/Results: DG Chest Port 1 View  Result Date: 10/02/2022 CLINICAL DATA:  Central line placement EXAM: PORTABLE CHEST 1 VIEW COMPARISON:  09/28/2022 FINDINGS: Multifocal patchy opacities in the lungs bilaterally, upper lung predominant, suspicious for multifocal pneumonia. This is progressive. No pleural effusion or pneumothorax. The heart is normal in size. Right chest power port terminates at the cavoatrial junction. Enteric tube courses into the distal stomach. IMPRESSION: Multifocal patchy opacities in the lungs bilaterally, suspicious for multifocal pneumonia, progressive. Right chest power port terminates at the cavoatrial junction. Enteric tube courses into the distal stomach. Electronically Signed   By: Julian Hy M.D.   On: 10/02/2022 17:46   [place] EKG SITE RITE  Result Date: 10/02/2022 If Site Rite image not attached, placement could not be confirmed due to current cardiac rhythm.   Anti-infectives: Anti-infectives (From admission, onward)    Start     Dose/Rate Route Frequency Ordered Stop   09/28/22 1645  cefTRIAXone (ROCEPHIN) 1 g in sodium chloride 0.9 % 100 mL IVPB  Status:  Discontinued        1 g 200  mL/hr over 30 Minutes Intravenous Every 24 hours 09/28/22 1643 10/03/22 0730        Assessment/Plan History of colon cancer with peritoneal carcinomatosis, suspected ileus vs psbo -followed by [facility] for this condition -x-rays yesterday show worsening SB dilatation, but he still have air and contrast in his colon.  This is c/w ileus vs psbo.  I do not think he is completely blocked as he continued to have some bowel function - unclear given his carcinomatosis whether this is something that will ultimately resolve or not.  If this is just an ileus, there is no surgical correction.  With that being said, even if this is a psbo, given his carcinomatosis, not sure that he would be surgical candidate. - advance  to FLD and would recommend NGT removal but patient declining this - ok to advance as tolerated to soft diet if continuing to have bowel function - Would recommend K >4 and Mg>2 to help with bowel function. - would not recommend TPN given that is not a long term solution for all this but defer to ordering provider on this   FEN - FLD, NGT clamped VTE - plavix, heparin ID - rocephin for UTI  I reviewed hospitalist notes, last 24 h vitals and pain scores, last 48 h intake and output, and last 24 h labs and trends.   LOS: 6 days    Norm Parcel , [facility] 10/04/2022, 10:52 AM Please see Amion for pager number during day hours 7:00am-4:30pm or 7:00am -11:30am on weekends

## 2022-10-04 NOTE — Plan of Care (Signed)
  Problem: Health Behavior/Discharge Planning: Goal: Ability to manage health-related needs will improve Outcome: Progressing   Problem: Clinical Measurements: Goal: Ability to maintain clinical measurements within normal limits will improve Outcome: Progressing   Problem: Clinical Measurements: Goal: Respiratory complications will improve Outcome: Progressing   Problem: Clinical Measurements: Goal: Cardiovascular complication will be avoided Outcome: Progressing   Problem: Nutrition: Goal: Adequate nutrition will be maintained Outcome: Progressing

## 2022-10-04 NOTE — Progress Notes (Signed)
PHARMACY - TOTAL PARENTERAL NUTRITION CONSULT NOTE   Indication: Prolonged ileus, malnutrition  Patient Measurements: Height: 6' (182.9 cm) Weight: 85.7 kg (188 lb 15 oz) IBW/kg (Calculated) : 77.6 TPN AdjBW (KG): 87.1 Body mass index is 25.62 kg/m. Usual Weight: 89-90kg  Assessment: 62 yo male presented with abdominal pain and hypotension. 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 Patient was placed on bowel rest and NG tube was placed on admission.  Glucose / Insulin: no hx DM, sensitive SSI ordered. Received 1 unit since TPN initiation. CBGs 110-133 Electrolytes: K 4 (s/p 2 runs), Na 131, Phos 3.2 (s/p NaPhos 14mol IV), Mg 2, CoCa 9.9. Overall electrolyte amounts increased with increase in TPN rate yesterday. - Keep K > 4, Mg > 2 Renal: Scr 0.8, BUN 11 (appears stable) Hepatic: LFTs, Tbili, alk phos WNL; albumin low but improved from 1.7 >>2 Intake / Output; MIVF: NS 357mhr; 180 mL NG output yesterday, no UOP quantified but x7 occurrences, LBM 3/1, 840 mL PO intake GI Imaging: - 3/1 Abd x-ray - progressive small bowel dilation, favoring pSBO GI Surgeries / Procedures:  - None this admission  Central access: PAC  TPN start date: 3/1  Nutritional Goals: Goal TPN rate is 95 mL/hr (provides 118 g of protein and 2304 kcals per day)  RD Assessment: Estimated Needs Total Energy Estimated Needs: 2300-2500 Total Protein Estimated Needs: 115-130g Total Fluid Estimated Needs: >/=2L  Current Nutrition:  Starting full liquid diet 3/3 - tolerating liquids thus far, percentage consumed not quantified TPN  Plan:  Increase TPN to 9541mr at 1800 (will provide 118g AA and 2304 kcal meeting ~100% of patients needs) Electrolytes in TPN: Na 67m18m, K 40mE68m Ca 3mEq/29mMg 5mEq/L66mnd Phos 15mmol/41ml:Ac 1:1 - increase in TPN rate will increase overall electrolyte  amounts Add standard MVI and trace elements to TPN Continue Sensitive q4h SSI and adjust as needed. Keeping q4 while diet being started and TPN being adjusted. Can likely reduce in next day or so if remains stable/tolerating PO. Reduce MIVF to NS at [facility] at [facility] Monitor TPN labs on Mon/Thurs and additionally as needed Patient has been getting thiamine per tube - unsure how well he is absorbing so will add thiamine to TPN for 5 days given refeeding risk (day 3) Add folic acid and famotidine to TPN as well (d/w CCS and prefer IV meds/in TPN if possible)  Monitor how patient tolerates diet order and follow-up ability to wean TPN  Patrick Bailey, BCPS 10/04/2022 7:37 AM

## 2022-10-04 NOTE — Progress Notes (Signed)
Declined 2000 glucose stated he needed his pain med first

## 2022-10-05 DIAGNOSIS — K567 Ileus, unspecified: Secondary | ICD-10-CM | POA: Diagnosis not present

## 2022-10-05 LAB — COMPREHENSIVE METABOLIC PANEL
ALT: 18 U/L (ref 0–44)
AST: 28 U/L (ref 15–41)
Albumin: 2 g/dL — ABNORMAL LOW (ref 3.5–5.0)
Alkaline Phosphatase: 80 U/L (ref 38–126)
Anion gap: 9 (ref 5–15)
BUN: 9 mg/dL (ref 8–23)
CO2: 23 mmol/L (ref 22–32)
Calcium: 8.6 mg/dL — ABNORMAL LOW (ref 8.9–10.3)
Chloride: 99 mmol/L (ref 98–111)
Creatinine, Ser: 0.9 mg/dL (ref 0.61–1.24)
GFR, Estimated: >60 mL/min (ref >60)
Glucose, Bld: 133 mg/dL — ABNORMAL HIGH (ref 70–99)
Potassium: 4.7 mmol/L (ref 3.5–5.1)
Sodium: 131 mmol/L — ABNORMAL LOW (ref 135–145)
Total Bilirubin: 0.3 mg/dL (ref 0.3–1.2)
Total Protein: 6.7 g/dL (ref 6.5–8.1)

## 2022-10-05 LAB — GLUCOSE, CAPILLARY
Glucose-Capillary: 130 mg/dL — ABNORMAL HIGH (ref 70–99)
Glucose-Capillary: 146 mg/dL — ABNORMAL HIGH (ref 70–99)
Glucose-Capillary: 106 mg/dL — ABNORMAL HIGH (ref 70–99)

## 2022-10-05 LAB — MAGNESIUM: Magnesium: 2.1 mg/dL (ref 1.7–2.4)

## 2022-10-05 LAB — PHOSPHORUS: Phosphorus: 3.1 mg/dL (ref 2.5–4.6)

## 2022-10-05 LAB — TRIGLYCERIDES: Triglycerides: 124 mg/dL (ref <150)

## 2022-10-05 MED ORDER — FAMOTIDINE 20 MG PO TABS: 20.0000 mg | ORAL_TABLET | Freq: Every day | ORAL | Status: DC

## 2022-10-05 MED ORDER — INSULIN ASPART 100 UNIT/ML IJ SOLN: 0.0000 [IU] | Freq: Four times a day (QID) | INTRAMUSCULAR | Status: DC

## 2022-10-05 MED ORDER — ONDANSETRON HCL 4 MG PO TABS: 4.0000 mg | ORAL_TABLET | Freq: Every day | ORAL | 1 refills | Status: DC | PRN

## 2022-10-05 MED ORDER — FOLIC ACID 1 MG PO TABS: 1.0000 mg | ORAL_TABLET | Freq: Every day | ORAL | Status: DC

## 2022-10-05 MED ORDER — ADULT MULTIVITAMIN W/MINERALS CH: 1.0000 | ORAL_TABLET | Freq: Every day | ORAL | Status: DC

## 2022-10-05 MED ORDER — HEPARIN SOD (PORK) LOCK FLUSH 100 UNIT/ML IV SOLN
500.0000 [IU] | INTRAVENOUS | Status: AC | PRN
  Administered 2022-10-05: 500 [IU]

## 2022-10-05 MED ORDER — TRAVASOL 10 % IV SOLN
INTRAVENOUS | Status: DC
  Filled 2022-10-05: qty 1185.6

## 2022-10-05 NOTE — Progress Notes (Addendum)
PHARMACY - TOTAL PARENTERAL NUTRITION CONSULT NOTE  Indication: Prolonged ileus  Patient Measurements: Height: 6' (182.9 cm) Weight: 85.7 kg (188 lb 15 oz) IBW/kg (Calculated) : 77.6 TPN AdjBW (KG): 87.1 Body mass index is 25.62 kg/m. Usual Weight: 89-90kg  Assessment:  Patrick Bailey presented with abdominal pain and hypotension. 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 Patient was placed on bowel rest and NG tube was placed on admission.  Pharmacy consulted to manage TPN.  Glucose / Insulin: no hx DM - CBGs < 180 Received 3 units since TPN initation, 1 unit today Electrolytes: all WNL except low Na (K high normal) Overall electrolyte amounts increased with increase in TPN rate yesterday Keep K > 4, Mg > 2 Renal: SCr < 1, BUN WNL Hepatic: LFTs / tbili / TG WNL, albumin low but improved to 2 Intake / Output; MIVF:  NS 21m/hr; NG tube clamped, no UOP documented, LBM 3/3 per pt GI Imaging: 3/1 KUB - progressive small bowel dilation, favoring pSBO GI Surgeries / Procedures: none this admission  Central access: implanted port placed 12/01/21 TPN start date: 10/02/22  Nutritional Goals: RD Estimated Needs Total Energy Estimated Needs: 2300-2500 Total Protein Estimated Needs: 115-130g Total Fluid Estimated Needs: >/=2L  Current Nutrition:  TPN  Full liquid diet 3/3 - tolerating well, ate about 1/2 of his breakfast, doesn't like taste  Plan:  Continue TPN at goal rate 95 mL/hr to provide 18g AA and 2304 kCal meeting ~100% of patients needs Electrolytes in TPN: increase Na 120 mEq/L, reduce K 30 mEq/L and Ca 26m/L, Mg 79m52mL, Phos 179m39mL, Cl:Ac 1:1  Add standard MVI and trace elements to TPN (need to con't multivitamin once off of TPN) Add folate, thiamine and Pepcid '20mg'$  to daily TPN since unsure of absorption Change Sensitive SSI to Q6H Monitor TPN labs on Mon/Thurs -  labs in AM Monitor how patient tolerates diet order and follow-up ability to wean TPN  Tim Rodolph BongarmD Candidate 10/05/2022 7:29 AM  ================================  Addendum: Stopping TPN for discharge Reduce TPN to 40 ml/hr x 2 hrs, then stop Folate '1mg'$  PO, multivitamin and Pepcid '20mg'$  PO daily D/C TPN labs and nursing care orders  Patrick Bailey, Patrick Bailey, Patrick Bailey, Patrick Bailey

## 2022-10-05 NOTE — Discharge Summary (Signed)
Physician Discharge Summary  Patrick Bailey. ZC:[id number] DOB: 1961-01-03 DOA: 09/28/2022  PCP: Ria Bush, MD  Admit date: 09/28/2022  Discharge date: 10/05/2022  Admitted From:Home  Disposition:  Home  Recommendations for Outpatient Follow-up:  Follow up with PCP in 1-2 weeks Follow up with surgery at [facility] as scheduled Continue medications as noted below  Home Health:None  Equipment/Devices:None  Discharge Condition:Stable  CODE STATUS: Full  Diet recommendation: Heart Healthy/soft  Brief/Interim Summary: Patrick Sabetta. is an 62 y.o. male past medical history significant for essential hypertension, right MCA stroke, anxiety, and depression, status post right hemicolectomy in 2023, recently diagnosed with peritoneal carcinomatosis on 1/24 on chemotherapy comes in complaining of abdominal pain that started 1 week prior to admission due to chemotherapy.  Has been having chronic diarrhea.  He is on FOLFOX started on January this year.  Imaging concerning for ileus.  Seen by general surgery.    He was conservatively managed for partial small bowel obstruction versus ileus and is now able to tolerate soft diet and no longer requires NGT. He is eager to have this removed and go home. He is to follow up at [facility] with his surgical team.  Discharge Diagnoses:  Principal Problem:   Ileus ([place]) Active Problems:   SIRS (systemic inflammatory response syndrome) (HCC)   Lactic acidosis   UTI (urinary tract infection)   AKI (acute kidney injury) ([place])   Malignant neoplasm of appendix (HCC)   Hypokalemia   Hypomagnesemia   Essential hypertension   Hypothyroidism   History of alcohol abuse   GERD (gastroesophageal reflux disease)   Malnutrition of moderate degree  Principal discharge diagnosis: Ileus vs PSBO resolved in setting of colon cancer with peritoneal carcinomatosis.   Discharge Instructions  Discharge Instructions     Diet - low sodium heart healthy    Complete by: As directed    Increase activity slowly   Complete by: As directed       Allergies as of 10/05/2022       Reactions   Hydrocodone Hives        Medication List     TAKE these medications    amitriptyline 50 MG tablet Commonly known as: ELAVIL TAKE 1 & 1/2 (ONE & ONE-HALF) TABLETS BY MOUTH AT BEDTIME What changed: See the new instructions.   amLODipine 10 MG tablet Commonly known as: NORVASC Take 1 tablet by mouth once daily   aspirin EC 81 MG tablet Take 1 tablet (81 mg total) by mouth daily. Swallow whole.   atorvastatin 40 MG tablet Commonly known as: LIPITOR Take 1 tablet by mouth once daily   clopidogrel 75 MG tablet Commonly known as: PLAVIX Take 1 tablet by mouth once daily   DentaGel 1.1 % Gel dental gel Generic drug: sodium fluoride PLACE 1 APPLICATION ONTO TEETH AT BEDTIME. What changed: See the new instructions.   famotidine 20 MG tablet Commonly known as: Pepcid Take 1 tablet (20 mg total) by mouth 2 (two) times daily.   fenofibrate 160 MG tablet Take 1 tablet by mouth once daily   Fish Oil 1000 MG Caps Take 2 capsules (2,000 mg total) by mouth 2 (two) times a day. What changed:  how much to take when to take this   gabapentin 400 MG capsule Commonly known as: NEURONTIN TAKE 1 CAPSULE BY MOUTH TWICE DAILY AND 3 AT BEDTIME What changed: See the new instructions.   levothyroxine 150 MCG tablet Commonly known as: SYNTHROID Take 1 tablet by  mouth once daily What changed: when to take this   metoprolol tartrate 100 MG tablet Commonly known as: LOPRESSOR Take 1 tablet (100 mg total) by mouth 2 (two) times daily.   Mitigare 0.6 MG Caps Generic drug: Colchicine Take 0.6 mg by mouth daily as needed (gout flare). TAKE 1 CAPSULE BY MOUTH IN THE MORNING AS NEEDED What changed: additional instructions   ondansetron 4 MG tablet Commonly known as: Zofran Take 1 tablet (4 mg total) by mouth daily as needed for nausea or vomiting.    valACYclovir 1000 MG tablet Commonly known as: VALTREX TAKE 2 TABLETS BY MOUTH TWICE DAILY FOR 1 DAY PER EPISODE What changed: See the new instructions.        Allergies  Allergen Reactions   Hydrocodone Hives    Consultations: Gen Surg   Procedures/Studies: DG Chest Port 1 View  Result Date: 10/02/2022 CLINICAL DATA:  Central line placement EXAM: PORTABLE CHEST 1 VIEW COMPARISON:  09/28/2022 FINDINGS: Multifocal patchy opacities in the lungs bilaterally, upper lung predominant, suspicious for multifocal pneumonia. This is progressive. No pleural effusion or pneumothorax. The heart is normal in size. Right chest power port terminates at the cavoatrial junction. Enteric tube courses into the distal stomach. IMPRESSION: Multifocal patchy opacities in the lungs bilaterally, suspicious for multifocal pneumonia, progressive. Right chest power port terminates at the cavoatrial junction. Enteric tube courses into the distal stomach. Electronically Signed   By: Julian Hy M.D.   On: 10/02/2022 17:46   [place] EKG SITE RITE  Result Date: 10/02/2022 If Site Rite image not attached, placement could not be confirmed due to current cardiac rhythm.  DG Abd Portable 1V  Result Date: 10/02/2022 CLINICAL DATA:  Ileus EXAM: PORTABLE ABDOMEN - 1 VIEW COMPARISON:  Yesterday FINDINGS: Nasogastric tube terminates at the distal stomach. Moderate small bowel dilatation at up to 4.9 cm. Similar bowel loop of 4.4 cm on the prior. Normal caliber, gas and stool filled colon. Contrast within the rectum. No gross free intraperitoneal air. IMPRESSION: Progressive small bowel dilatation, favoring partial small bowel obstruction over adynamic ileus. Electronically Signed   By: Abigail Miyamoto M.D.   On: 10/02/2022 08:10   DG Abd Portable 1V  Result Date: 10/01/2022 CLINICAL DATA:  Iliacs.  Abdominal pain. EXAM: PORTABLE ABDOMEN - 1 VIEW COMPARISON:  09/30/2022 FINDINGS: Enteric tube tip overlies the level of the  descending portion of the duodenum. There has been some interval improvement in dilatation of small bowel loops. Persistent dilatation of both small and large bowel loops is noted. Contrast is identified within the rectum previous contrast study. No evidence for free intraperitoneal air. IMPRESSION: 1. Enteric tube tip overlies the descending portion of the duodenum. 2. Persistent dilatation of both small and large bowel loops, with some interval improvement. Electronically Signed   By: Nolon Nations M.D.   On: 10/01/2022 18:03   DG Abd Portable 1V-Small Bowel Obstruction Protocol-24 hr delay  Result Date: 09/30/2022 CLINICAL DATA:  Small-bowel obstruction.  NG tube placement. EXAM: PORTABLE ABDOMEN - 1 VIEW COMPARISON:  09/29/2022 FINDINGS: The NG tube tip is in the descending duodenum. Persistent air distended bowel. No obvious free air. IMPRESSION: NG tube tip is in the descending duodenum. Persistent air distended bowel. Electronically Signed   By: Marijo Sanes M.D.   On: 09/30/2022 11:47   DG Abd Portable 1V-Small Bowel Obstruction Protocol-initial, 8 hr delay  Result Date: 09/29/2022 CLINICAL DATA:  Small bowel obstruction 8 hour delayed image. History of stomach cancer with ongoing  chemotherapy. EXAM: PORTABLE ABDOMEN - 1 VIEW COMPARISON:  Previous exams. FINDINGS: Nasogastric tube has been advanced slightly with tip in the right upper quadrant. Surgical clips over the left upper quadrant. Persistent air-filled loops of large and small bowel with possible progressive worsening dilated small bowel loops in the central abdomen measuring up to 5.8 cm in diameter. No free peritoneal air. Contrast present within the bladder. Remainder of the exam is unchanged. IMPRESSION: 1. Persistent air-filled loops of large and small bowel with possible progressive worsening dilated small bowel loops in the central abdomen measuring up to 5.8 cm in diameter. Findings may be due to ileus versus small-bowel  obstruction. 2. Nasogastric tube advanced slightly with tip in the right upper quadrant. Electronically Signed   By: Marin Olp M.D.   On: 09/29/2022 20:21   DG Abd Portable 1V  Result Date: 09/29/2022 CLINICAL DATA:  Small-bowel obstruction, advanced NG tube EXAM: PORTABLE ABDOMEN - 1 VIEW COMPARISON:  09/29/2022 9:10 a.m. FINDINGS: Esophagogastric tube has been advanced, tip and side port now below the diaphragm, tip in the vicinity of the duodenal bulb. Unchanged, diffusely distended small bowel and colon in the included upper abdomen. No free air. IMPRESSION: Esophagogastric tube has been advanced, tip and side port now below the diaphragm, tip in the vicinity of the duodenal bulb. Electronically Signed   By: Delanna Ahmadi M.D.   On: 09/29/2022 10:31   DG Abd Portable 1V-Small Bowel Protocol-Position Verification  Result Date: 09/29/2022 CLINICAL DATA:  Nasogastric tube placement EXAM: PORTABLE ABDOMEN - 1 VIEW COMPARISON:  09/28/2022 FINDINGS: Nasogastric tube tip is in the gastric cardia just past the gastroesophageal junction. Consider advancing 10 cm to more firmly establish the tip in the gastric body. Worsened bandlike opacities at the lung bases favoring atelectasis. Thoracolumbar spondylosis. A central line tip projects over the distal SVC. Suspected dilated loops of upper abdominal small bowel up to about 5.3 cm in diameter. IMPRESSION: 1. Nasogastric tube tip is in the gastric cardia just past the gastroesophageal junction. Consider advancing 10 cm to more firmly establish the positioning in the gastric body. 2. Worsened bandlike opacities at the lung bases favoring atelectasis. 3. Continued dilated loops of small bowel in the upper abdomen. Electronically Signed   By: Van Clines M.D.   On: 09/29/2022 09:18   CT ABDOMEN PELVIS WO CONTRAST  Result Date: 09/28/2022 CLINICAL DATA:  Stomach cancer, abdominal pain and distention, ongoing chemotherapy * Tracking Code: BO * EXAM: CT  ABDOMEN AND PELVIS WITHOUT CONTRAST TECHNIQUE: Multidetector CT imaging of the abdomen and pelvis was performed following the standard protocol without IV contrast. RADIATION DOSE REDUCTION: This exam was performed according to the departmental dose-optimization program which includes automated exposure control, adjustment of the mA and/or kV according to patient size and/or use of iterative reconstruction technique. COMPARISON:  11/14/2021 FINDINGS: Lower chest: No acute abnormality. Hepatobiliary: No solid liver abnormality is seen. Hepatic steatosis. No gallstones, gallbladder wall thickening, or biliary dilatation. Pancreas: Unremarkable. No pancreatic ductal dilatation or surrounding inflammatory changes. Spleen: Normal in size without significant abnormality. Adrenals/Urinary Tract: Adrenal glands are unremarkable. Kidneys are normal, without renal calculi, solid lesion, or hydronephrosis. Bladder is unremarkable. Stomach/Bowel: Stomach is within normal limits. Status post right hemicolectomy and reanastomosis. The mid to distal small bowel is diffusely fluid-filled and mildly distended, largest loops in the low pelvis measuring up to 4.3 cm in caliber (series 5, image 74). The colon is patulous appearing, gas and fluid-filled, measuring up to 8.0 cm in  caliber (series 5, image 43). The distal sigmoid colon and rectum are relatively decompressed, without evidence of transition point, with scattered gas and stool present to the rectum. Vascular/Lymphatic: Aortic atherosclerosis. No enlarged abdominal or pelvic lymph nodes. Reproductive: No mass or other significant abnormality. Other: Small, fat containing bilateral inguinal hernias. No ascites. Musculoskeletal: No acute or significant osseous findings. IMPRESSION: 1. Status post right hemicolectomy and reanastomosis. 2. The mid to distal small bowel is diffusely fluid-filled and mildly distended, largest loops in the low pelvis measuring up to 4.3 cm in  caliber. The colon is patulous appearing, gas and fluid-filled, measuring up to 8.0 cm in caliber. The distal sigmoid colon and rectum are relatively decompressed, without evidence of transition point, with scattered gas and stool present to the rectum. Findings are most suggestive of ileus. 3. Patient's known gastric malignancy is not clearly appreciated by CT. No noncontrast evidence of lymphadenopathy or metastatic disease in the abdomen or pelvis. 4. Hepatic steatosis. Aortic Atherosclerosis (ICD10-I70.0). Electronically Signed   By: Delanna Ahmadi M.D.   On: 09/28/2022 13:39   DG Abd Acute W/Chest  Result Date: 09/28/2022 CLINICAL DATA:  Stomach cancer. Receiving chemotherapy. Distension. EXAM: DG ABDOMEN ACUTE WITH 1 VIEW CHEST COMPARISON:  04/10/2022 FINDINGS: Power port tip in the SVC above the right atrium. Heart size upper limits of normal. The patient has taken a shallow inspiration. Allowing for that, the lungs are clear. No free air seen under the diaphragm.There is gaseous distension of the colon. Lesser dilatation of the small bowel. Findings most consistent with ileus. No free air. IMPRESSION: 1. Gaseous distension of the colon and small bowel most consistent with ileus. No free air. 2. No active cardiopulmonary disease. Shallow inspiration. Electronically Signed   By: Nelson Chimes M.D.   On: 09/28/2022 12:51   CT Head Wo Contrast  Result Date: 09/28/2022 CLINICAL DATA:  Vision changes EXAM: CT HEAD WITHOUT CONTRAST TECHNIQUE: Contiguous axial images were obtained from the base of the skull through the vertex without intravenous contrast. RADIATION DOSE REDUCTION: This exam was performed according to the departmental dose-optimization program which includes automated exposure control, adjustment of the mA and/or kV according to patient size and/or use of iterative reconstruction technique. COMPARISON:  MRI Brain 07/01/22 FINDINGS: Brain: No evidence of acute infarction, hemorrhage,  hydrocephalus, extra-axial collection or mass lesion/mass effect. Sequela of mild-to-moderate chronic microvascular ischemic change. Vascular: No hyperdense vessel or unexpected calcification. Skull: Normal. Negative for fracture or focal lesion. Sinuses/Orbits: No middle ear or mastoid effusion. Paranasal sinuses are clear. Orbits are unremarkable. Other: None. IMPRESSION: 1. No hemorrhage or CT evidence of an acute infarct. 2. Sequela of moderate chronic microvascular ischemic change. Electronically Signed   By: Marin Roberts M.D.   On: 09/28/2022 12:19   IR Radiologist Eval & Mgmt  Result Date: 09/22/2022 EXAM: ESTABLISHED PATIENT OFFICE VISIT CHIEF COMPLAINT: Follow-up visit. Current Pain Level: 1-10 HISTORY OF PRESENT ILLNESS: 62 year old right-handed gentleman who returns in follow-up subsequent to an uneventful endovascular revascularization of the proximal right internal carotid artery micro aneurysms on 08/10/2022. There were no periprocedural or immediate postprocedural complications. The patient was discharged home under the care of his family members. Patient returns today in follow-up. Patient presently complains of no new neurological symptoms. Specifically, no headaches, nausea or vomiting, visual aberrations, blindness, diplopia. Patient reports no change in the mild dysphagia related to previous surgery for removal of cancer. His paresthesias in the lower and upper extremities related to chemotherapy are also stable. The patient  maintains normal mobility independently. Reported though unchanged intermittent vertigo which remains unchanged even prior to the procedure. He attributes this to chemotherapy. Denies experiencing any preprocedural TIA like symptoms specifically paresthesias and numbness of his arm and leg. Reports his appetite is normal, with loss in the past month. Patient is presently undergoing cycle of IV chemotherapy from a primary of the appendix. Denies any difficulty with  constipation or abdominal pain. Does report diarrhea related to chemotherapy which is controlled medically. He just finished chemotherapy and is scheduled for his fourth followed by MRI as per Oncology at [facility]. Denies recent chest pain, shortness of breath, palpitations. Denies any wheezing, coughing or of hemoptysis. Denies any dysuria, hematuria, or polyuria. No recent chills, fever or rigors. Past Medical History: No. Medications: No. Allergies: No. Social History: No. Family History:  No change. REVIEW OF SYSTEMS: Negative unless as mentioned above. PHYSICAL EXAMINATION: Alert, awake, oriented to time, place, space. Normal eye contact. Appears depressed due to his medical condition. Neurologically grossly no abnormal signs elicited. Station and gait within normal limits. ASSESSMENT AND PLAN: Advised patient to continue with the baby aspirin daily, and Plavix 75 mg daily. Follow-up ultrasound of the carotids in 3 months' time, last week in May 2024. Patient advised to maintain adequate hydration. Electronically Signed   By: Luanne Bras M.D.   On: 09/22/2022 09:11     Discharge Exam: Vitals:   10/05/22 0414 10/05/22 0937  BP: 130/81 122/73  Pulse: (!) 106 (!) 107  Resp: 18   Temp:  98.2 F (36.8 C)  SpO2: 94% 100%   Vitals:   10/04/22 1418 10/04/22 1956 10/05/22 0414 10/05/22 0937  BP: (!) 138/91 127/78 130/81 122/73  Pulse: (!) 103 (!) 103 (!) 106 (!) 107  Resp: '14 20 18   '$ Temp: 98.3 F (36.8 C) 98.2 F (36.8 C)  98.2 F (36.8 C)  TempSrc:  Oral  Oral  SpO2: 94% 97% 94% 100%  Weight:      Height:        General: Pt is alert, awake, not in acute distress Cardiovascular: RRR, S1/S2 +, no rubs, no gallops Respiratory: CTA bilaterally, no wheezing, no rhonchi Abdominal: Soft, NT, ND, bowel sounds + Extremities: no edema, no cyanosis    The results of significant diagnostics from this hospitalization (including imaging, microbiology, ancillary and laboratory) are  listed below for reference.     Microbiology: Recent Results (from the past 240 hour(s))  Blood culture (routine x 2)     Status: None   Collection Time: 09/28/22 11:29 AM   Specimen: BLOOD RIGHT FOREARM  Result Value Ref Range Status   Specimen Description BLOOD RIGHT FOREARM  Final   Special Requests   Final    BOTTLES DRAWN AEROBIC AND ANAEROBIC Blood Culture adequate volume   Culture   Final    NO GROWTH 5 DAYS Performed at [facility], [address], [place], [place] [address]    Report Status 10/03/2022 FINAL  Final  Blood culture (routine x 2)     Status: None   Collection Time: 09/28/22 11:40 AM   Specimen: BLOOD  Result Value Ref Range Status   Specimen Description BLOOD LEFT ANTECUBITAL  Final   Special Requests   Final    BOTTLES DRAWN AEROBIC AND ANAEROBIC Blood Culture results may not be optimal due to an excessive volume of blood received in culture bottles   Culture   Final    NO GROWTH 5 DAYS  Performed at [facility], [place] [address], [place], [place] [address]    Report Status 10/03/2022 FINAL  Final     Labs: BNP (last 3 results) No results for input(s): "BNP" in the last 8760 hours. Basic Metabolic Panel: Recent Labs  Lab 09/30/22 0158 10/01/22 0700 10/02/22 0630 10/03/22 0857 10/04/22 0845 10/05/22 0714  NA 132* 133* 134* 131* 131* 131*  K 3.6 3.8 3.4* 3.7 4.0 4.7  CL 99 98 99 97* 97* 99  CO2 21* '26 25 26 26 23  '$ GLUCOSE 131* 114* 122* 133* 119* 133*  BUN '21 15 12 8 11 9  '$ CREATININE 1.01 1.00 0.81 0.71 0.83 0.90  CALCIUM 8.2* 8.5* 7.9* 7.6* 8.3* 8.6*  MG 2.2 2.2  --  2.0 2.0 2.1  PHOS  --  1.3* 2.3* 2.1* 3.2 3.1   Liver Function Tests: Recent Labs  Lab 09/28/22 1129 10/01/22 0700 10/03/22 0857 10/04/22 0845 10/05/22 0714  AST '31 30 24 25 28  '$ ALT '12 15 15 13 18  '$ ALKPHOS 50 94 69 78 80  BILITOT 0.9 0.8 0.4 0.5 0.3  PROT 6.1* 6.6 5.7* 6.3* 6.7  ALBUMIN 2.4* 2.2* 1.7* 2.0* 2.0*   Recent Labs  Lab 09/28/22 1129   LIPASE 24   No results for input(s): "AMMONIA" in the last 168 hours. CBC: Recent Labs  Lab 09/28/22 1129 09/28/22 1153 09/29/22 0157 10/01/22 0700 10/04/22 0845  WBC 6.1  --  5.8 10.0 12.1*  NEUTROABS 4.1  --   --  7.4  --   HGB 13.7 13.9 12.1* 13.0 10.4*  HCT 38.6* 41.0 33.2* 36.5* 30.0*  MCV 93.2  --  90.5 92.2 94.0  PLT 258  --  234 304 277   Cardiac Enzymes: No results for input(s): "CKTOTAL", "CKMB", "CKMBINDEX", "TROPONINI" in the last 168 hours. BNP: Invalid input(s): "POCBNP" CBG: Recent Labs  Lab 10/04/22 0830 10/04/22 1210 10/04/22 1631 10/05/22 0418 10/05/22 0932  GLUCAP 124* 118* 123* 130* 146*   D-Dimer No results for input(s): "DDIMER" in the last 72 hours. Hgb A1c No results for input(s): "HGBA1C" in the last 72 hours. Lipid Profile Recent Labs    10/03/22 0857 10/05/22 0714  TRIG 108 124   Thyroid function studies No results for input(s): "TSH", "T4TOTAL", "T3FREE", "THYROIDAB" in the last 72 hours.  Invalid input(s): "FREET3" Anemia work up No results for input(s): "VITAMINB12", "FOLATE", "FERRITIN", "TIBC", "IRON", "RETICCTPCT" in the last 72 hours. Urinalysis    Component Value Date/Time   COLORURINE YELLOW 09/28/2022 1317   APPEARANCEUR CLEAR 09/28/2022 1317   LABSPEC >1.030 (H) 09/28/2022 1317   PHURINE 5.5 09/28/2022 1317   GLUCOSEU 100 (A) 09/28/2022 1317   HGBUR NEGATIVE 09/28/2022 1317   BILIRUBINUR MODERATE (A) 09/28/2022 1317   BILIRUBINUR negative 04/07/2019 1643   KETONESUR NEGATIVE 09/28/2022 1317   PROTEINUR 100 (A) 09/28/2022 1317   UROBILINOGEN 0.2 04/07/2019 1643   NITRITE POSITIVE (A) 09/28/2022 1317   LEUKOCYTESUR NEGATIVE 09/28/2022 1317   Sepsis Labs Recent Labs  Lab 09/28/22 1129 09/29/22 0157 10/01/22 0700 10/04/22 0845  WBC 6.1 5.8 10.0 12.1*   Microbiology Recent Results (from the past 240 hour(s))  Blood culture (routine x 2)     Status: None   Collection Time: 09/28/22 11:29 AM   Specimen:  BLOOD RIGHT FOREARM  Result Value Ref Range Status   Specimen Description BLOOD RIGHT FOREARM  Final   Special Requests   Final    BOTTLES DRAWN AEROBIC AND ANAEROBIC Blood  Culture adequate volume   Culture   Final    NO GROWTH 5 DAYS Performed at [facility], [place] [address], [place], [place] [address]    Report Status 10/03/2022 FINAL  Final  Blood culture (routine x 2)     Status: None   Collection Time: 09/28/22 11:40 AM   Specimen: BLOOD  Result Value Ref Range Status   Specimen Description BLOOD LEFT ANTECUBITAL  Final   Special Requests   Final    BOTTLES DRAWN AEROBIC AND ANAEROBIC Blood Culture results may not be optimal due to an excessive volume of blood received in culture bottles   Culture   Final    NO GROWTH 5 DAYS Performed at [facility], [place] [address] [place],  [address]    Report Status 10/03/2022 FINAL  Final     Time coordinating discharge: 35 minutes  SIGNED:   Rodena Goldmann, DO [facility] 10/05/2022, 11:03 AM  If 7PM-7AM, please contact night-coverage [email or web address]

## 2022-10-05 NOTE — Progress Notes (Signed)
Subjective: NGT clamped. Tolerating FLD without abdominal pain, n/v. Passing flatus. Liquid bm overnight.   Objective: Vital signs in last 24 hours: Temp:  [98.2 F (36.8 C)-98.3 F (36.8 C)] 98.2 F (36.8 C) (03/04 0937) Pulse Rate:  [100-107] 107 (03/04 0937) Resp:  [14-20] 18 (03/04 0414) BP: (122-138)/(73-91) 122/73 (03/04 0937) SpO2:  [94 %-100 %] 100 % (03/04 0937) Last BM Date : 10/03/22 (per pt)  Intake/Output from previous day: No intake/output data recorded. Intake/Output this shift: Total I/O In: 3 [I.V.:3] Out: -   PE: Gen: NAD Abd: Soft, mild distension, completely NT, +BS  Lab Results:  Recent Labs    10/04/22 0845  WBC 12.1*  HGB 10.4*  HCT 30.0*  PLT 277    BMET Recent Labs    10/04/22 0845 10/05/22 0714  NA 131* 131*  K 4.0 4.7  CL 97* 99  CO2 26 23  GLUCOSE 119* 133*  BUN 11 9  CREATININE 0.83 0.90  CALCIUM 8.3* 8.6*    PT/INR No results for input(s): "LABPROT", "INR" in the last 72 hours. CMP     Component Value Date/Time   NA 131 (L) 10/05/2022 0714   NA 137 03/11/2014 1025   K 4.7 10/05/2022 0714   K 4.0 03/11/2014 1025   CL 99 10/05/2022 0714   CL 104 03/11/2014 1025   CO2 23 10/05/2022 0714   CO2 25 03/11/2014 1025   GLUCOSE 133 (H) 10/05/2022 0714   GLUCOSE 132 (H) 03/11/2014 1025   BUN 9 10/05/2022 0714   BUN 15 03/11/2014 1025   CREATININE 0.90 10/05/2022 0714   CREATININE 1.06 12/01/2019 1627   CALCIUM 8.6 (L) 10/05/2022 0714   CALCIUM 8.6 03/11/2014 1025   PROT 6.7 10/05/2022 0714   PROT 7.1 02/23/2018 1627   ALBUMIN 2.0 (L) 10/05/2022 0714   AST 28 10/05/2022 0714   ALT 18 10/05/2022 0714   ALKPHOS 80 10/05/2022 0714   BILITOT 0.3 10/05/2022 0714   GFRNONAA >60 10/05/2022 0714   GFRNONAA >60 03/11/2014 1025   GFRAA >60 01/11/2020 1609   GFRAA >60 03/11/2014 1025   Lipase     Component Value Date/Time   LIPASE 24 09/28/2022 1129       Studies/Results: No results  found.  Anti-infectives: Anti-infectives (From admission, onward)    Start     Dose/Rate Route Frequency Ordered Stop   09/28/22 1645  cefTRIAXone (ROCEPHIN) 1 g in sodium chloride 0.9 % 100 mL IVPB  Status:  Discontinued        1 g 200 mL/hr over 30 Minutes Intravenous Every 24 hours 09/28/22 1643 10/03/22 0730        Assessment/Plan History of colon cancer with peritoneal carcinomatosis, suspected ileus vs psbo -followed by [facility] for this condition -x-rays previously with air and contrast in his colon c/w ileus vs psbo.  - unclear given his carcinomatosis whether this is something that will ultimately resolve or not.  If this is just an ileus, there is no surgical correction.  With that being said, even if this is a psbo, given his carcinomatosis, not sure that he would be surgical candidate. - Currently seems to be resolving with resolution of symptoms and return of bowel function. D/c NGT. Adv to soft diet.  - would not recommend TPN given that is not a long term solution for all this but defer to ordering provider on this   FEN - D/c NGT. Soft diet VTE - plavix, subq  heparin ID - None currently.   I reviewed hospitalist notes, last 24 h vitals and pain scores, last 48 h intake and output, and last 24 h labs and trends.   LOS: 7 days    Jillyn Ledger , [facility] 10/05/2022, 10:49 AM Please see Amion for pager number during day hours 7:00am-4:30pm or 7:00am -11:30am on weekends

## 2022-10-05 NOTE — TOC Transition Note (Addendum)
Transition of Care [facility]) - CM/SW Discharge Note   Patient Details  Name: Patrick Bailey. MRN: [id number] Date of Birth: [birth year]  Transition of Care [facility] CM/SW Contact:  Sharin Mons, RN Phone Number: 10/05/2022, 1:50 PM   Clinical Narrative:    Patient will DC to: home Anticipated DC date: 10/05/2022 Family notified: yes, dad Transport by: care  Presented with ABD pain / ileus. Hx of HTN,  stroke, anxiety, and depression, s/p right hemicolectomy,  peritoneal carcinomatosis/chemotherapy. From home with dad. PTA independent with ADL's, no DME usage. Per MD patient ready for DC to home . RN, patient, patient's dad aware of d/c plan. Pt declined outpatient palliative care. Post hospital f/u noted on AVS. Pt without RX med concerns. Father to provide transportation to home.   RNCM will sign off for now as intervention is no longer needed. Please consult [place] again if new needs arise.    Final next level of care: Home/Self Care Barriers to Discharge: No Barriers Identified   Patient Goals and CMS Choice      Discharge Placement                         Discharge Plan and Services Additional resources added to the After Visit Summary for                                       Social Determinants of Health (SDOH) Interventions SDOH Screenings   Food Insecurity: No Food Insecurity (09/28/2022)  Housing: Low Risk  (09/28/2022)  Transportation Needs: No Transportation Needs (09/28/2022)  Utilities: Not At Risk (09/28/2022)  Depression (PHQ2-9): Medium Risk (01/06/2022)  Tobacco Use: Medium Risk (09/29/2022)     Readmission Risk Interventions     No data to display

## 2022-10-05 NOTE — Plan of Care (Signed)
  Problem: Education: Goal: Knowledge of General Education information will improve Description: Including pain rating scale, medication(s)/side effects and non-pharmacologic comfort measures 10/05/2022 1524 by Shelle Iron, LPN Outcome: Adequate for Discharge 10/05/2022 1524 by Shelle Iron, LPN Outcome: Adequate for Discharge   Problem: Health Behavior/Discharge Planning: Goal: Ability to manage health-related needs will improve 10/05/2022 1524 by Shelle Iron, LPN Outcome: Adequate for Discharge 10/05/2022 1524 by Shelle Iron, LPN Outcome: Adequate for Discharge   Problem: Clinical Measurements: Goal: Ability to maintain clinical measurements within normal limits will improve 10/05/2022 1524 by Shelle Iron, LPN Outcome: Adequate for Discharge 10/05/2022 1524 by Shelle Iron, LPN Outcome: Adequate for Discharge Goal: Will remain free from infection 10/05/2022 1524 by Shelle Iron, LPN Outcome: Adequate for Discharge 10/05/2022 1524 by Shelle Iron, LPN Outcome: Adequate for Discharge Goal: Diagnostic test results will improve 10/05/2022 1524 by Shelle Iron, LPN Outcome: Adequate for Discharge 10/05/2022 1524 by Shelle Iron, LPN Outcome: Adequate for Discharge Goal: Respiratory complications will improve 10/05/2022 1524 by Shelle Iron, LPN Outcome: Adequate for Discharge 10/05/2022 1524 by Shelle Iron, LPN Outcome: Adequate for Discharge Goal: Cardiovascular complication will be avoided 10/05/2022 1524 by Shelle Iron, LPN Outcome: Adequate for Discharge 10/05/2022 1524 by Shelle Iron, LPN Outcome: Adequate for Discharge   Problem: Activity: Goal: Risk for activity intolerance will decrease 10/05/2022 1524 by Shelle Iron, LPN Outcome: Adequate for Discharge 10/05/2022 1524 by Shelle Iron, LPN Outcome: Adequate for Discharge   Problem: Nutrition: Goal: Adequate nutrition will be maintained 10/05/2022 1524 by Shelle Iron, LPN Outcome:  Adequate for Discharge 10/05/2022 1524 by Shelle Iron, LPN Outcome: Adequate for Discharge   Problem: Coping: Goal: Level of anxiety will decrease 10/05/2022 1524 by Shelle Iron, LPN Outcome: Adequate for Discharge 10/05/2022 1524 by Shelle Iron, LPN Outcome: Adequate for Discharge   Problem: Elimination: Goal: Will not experience complications related to bowel motility 10/05/2022 1524 by Shelle Iron, LPN Outcome: Adequate for Discharge 10/05/2022 1524 by Shelle Iron, LPN Outcome: Adequate for Discharge Goal: Will not experience complications related to urinary retention 10/05/2022 1524 by Shelle Iron, LPN Outcome: Adequate for Discharge 10/05/2022 1524 by Shelle Iron, LPN Outcome: Adequate for Discharge   Problem: Pain Managment: Goal: General experience of comfort will improve 10/05/2022 1524 by Shelle Iron, LPN Outcome: Adequate for Discharge 10/05/2022 1524 by Shelle Iron, LPN Outcome: Adequate for Discharge   Problem: Safety: Goal: Ability to remain free from injury will improve 10/05/2022 1524 by Shelle Iron, LPN Outcome: Adequate for Discharge 10/05/2022 1524 by Shelle Iron, LPN Outcome: Adequate for Discharge   Problem: Skin Integrity: Goal: Risk for impaired skin integrity will decrease 10/05/2022 1524 by Shelle Iron, LPN Outcome: Adequate for Discharge 10/05/2022 1524 by Shelle Iron, LPN Outcome: Adequate for Discharge

## 2022-10-06 ENCOUNTER — Telehealth: Payer: Self-pay | Admitting: *Deleted

## 2022-10-06 DIAGNOSIS — C181 Malignant neoplasm of appendix: Principal | ICD-10-CM

## 2022-10-06 NOTE — Transitions of Care (Post Inpatient/ED Visit) (Signed)
   10/06/2022  Name: Patrick Bailey. MRN: [id number] DOB: 1960-08-16  Today's TOC FU Call Status: Today's TOC FU Call Status:: Successful TOC FU Call Competed TOC FU Call Complete Date: 10/06/22  Transition Care Management Follow-up Telephone Call Date of Discharge: 10/05/22 Discharge Facility: [facility]) Type of Discharge: Inpatient Admission Primary Inpatient Discharge Diagnosis:: ileus How have you been since you were released from the hospital?: Better (weak) Any questions or concerns?: No  Items Reviewed: Did you receive and understand the discharge instructions provided?: Yes Medications obtained and verified?: Yes (Medications Reviewed) Any new allergies since your discharge?: No Dietary orders reviewed?: No Do you have support at home?: Yes People in Home: parent(s) Name of Support/Comfort Primary Source: [place] and Equipment/Supplies: [place] Ordered?: No Any new equipment or medical supplies ordered?: No  Functional Questionnaire: Do you need assistance with bathing/showering or dressing?: No Do you need assistance with meal preparation?: Yes Do you need assistance with eating?: No Do you have difficulty maintaining continence: No Do you need assistance with getting out of bed/getting out of a chair/moving?: No Do you have difficulty managing or taking your medications?: No  Folllow up appointments reviewed: PCP Follow-up appointment confirmed?: Yes (Care guide scheduled) Date of PCP follow-up appointment?: 10/13/22 Follow-up Provider: Dr Danise [facility] Follow-up appointment confirmed?: Yes Date of Specialist follow-up appointment?: 10/08/22 Follow-Up Specialty Provider:: Tammy Triglianos Do you need transportation to your follow-up appointment?: No Do you understand care options if your condition(s) worsen?: Yes-patient verbalized understanding  SDOH Interventions Today    Flowsheet Row Most  Recent Value  SDOH Interventions   Food Insecurity Interventions Intervention Not Indicated  Housing Interventions Intervention Not Indicated  Transportation Interventions Intervention Not Indicated      Interventions Today    Flowsheet Row Most Recent Value  General Interventions   General Interventions Discussed/Reviewed Doctor Visits  Doctor Visits Discussed/Reviewed Doctor Visits Reviewed, Doctor Visits Discussed  ID:[id number] Guide scheduled F/U visit]       [place] [facility] [phone]

## 2022-10-08 ENCOUNTER — Institutional Professional Consult (permissible substitution): Admit: 2022-10-08 | Discharge: 2022-10-09 | Payer: PRIVATE HEALTH INSURANCE

## 2022-10-08 ENCOUNTER — Ambulatory Visit: Admit: 2022-10-08 | Discharge: 2022-10-09 | Payer: PRIVATE HEALTH INSURANCE

## 2022-10-08 DIAGNOSIS — K567 Ileus, unspecified: Principal | ICD-10-CM

## 2022-10-08 DIAGNOSIS — C786 Secondary malignant neoplasm of retroperitoneum and peritoneum: Principal | ICD-10-CM

## 2022-10-08 DIAGNOSIS — K521 Toxic gastroenteritis and colitis: Principal | ICD-10-CM

## 2022-10-08 DIAGNOSIS — C181 Malignant neoplasm of appendix: Principal | ICD-10-CM

## 2022-10-08 DIAGNOSIS — Z09 Encounter for follow-up examination after completed treatment for conditions other than malignant neoplasm: Principal | ICD-10-CM

## 2022-10-08 DIAGNOSIS — I779 Disorder of arteries and arterioles, unspecified: Principal | ICD-10-CM

## 2022-10-08 DIAGNOSIS — Z8673 Personal history of transient ischemic attack (TIA), and cerebral infarction without residual deficits: Principal | ICD-10-CM

## 2022-10-08 DIAGNOSIS — T451X5A Adverse effect of antineoplastic and immunosuppressive drugs, initial encounter: Principal | ICD-10-CM

## 2022-10-08 DIAGNOSIS — I2510 Atherosclerotic heart disease of native coronary artery without angina pectoris: Secondary | ICD-10-CM | POA: Diagnosis not present

## 2022-10-08 DIAGNOSIS — Z9221 Personal history of antineoplastic chemotherapy: Secondary | ICD-10-CM | POA: Diagnosis not present

## 2022-10-08 DIAGNOSIS — Z96653 Presence of artificial knee joint, bilateral: Secondary | ICD-10-CM | POA: Diagnosis not present

## 2022-10-08 DIAGNOSIS — K219 Gastro-esophageal reflux disease without esophagitis: Secondary | ICD-10-CM | POA: Diagnosis not present

## 2022-10-08 DIAGNOSIS — I10 Essential (primary) hypertension: Secondary | ICD-10-CM | POA: Diagnosis not present

## 2022-10-08 DIAGNOSIS — E039 Hypothyroidism, unspecified: Secondary | ICD-10-CM | POA: Diagnosis not present

## 2022-10-12 ENCOUNTER — Ambulatory Visit: Admit: 2022-10-12 | Discharge: 2022-10-13 | Payer: PRIVATE HEALTH INSURANCE

## 2022-10-12 DIAGNOSIS — C181 Malignant neoplasm of appendix: Principal | ICD-10-CM

## 2022-10-12 DIAGNOSIS — Z5111 Encounter for antineoplastic chemotherapy: Secondary | ICD-10-CM | POA: Diagnosis not present

## 2022-10-13 ENCOUNTER — Encounter: Payer: Self-pay | Admitting: Family Medicine

## 2022-10-13 ENCOUNTER — Ambulatory Visit (INDEPENDENT_AMBULATORY_CARE_PROVIDER_SITE_OTHER): Payer: BC Managed Care – PPO | Admitting: Family Medicine

## 2022-10-13 VITALS — BP 116/68 | HR 86 | Temp 97.4°F | Ht 72.0 in | Wt 185.0 lb

## 2022-10-13 DIAGNOSIS — C181 Malignant neoplasm of appendix: Secondary | ICD-10-CM | POA: Diagnosis not present

## 2022-10-13 DIAGNOSIS — K219 Gastro-esophageal reflux disease without esophagitis: Secondary | ICD-10-CM

## 2022-10-13 DIAGNOSIS — K567 Ileus, unspecified: Secondary | ICD-10-CM

## 2022-10-13 DIAGNOSIS — F5104 Psychophysiologic insomnia: Secondary | ICD-10-CM

## 2022-10-13 DIAGNOSIS — D849 Immunodeficiency, unspecified: Secondary | ICD-10-CM | POA: Diagnosis not present

## 2022-10-13 DIAGNOSIS — Z8673 Personal history of transient ischemic attack (TIA), and cerebral infarction without residual deficits: Secondary | ICD-10-CM

## 2022-10-13 DIAGNOSIS — C786 Secondary malignant neoplasm of retroperitoneum and peritoneum: Secondary | ICD-10-CM | POA: Diagnosis not present

## 2022-10-13 DIAGNOSIS — G629 Polyneuropathy, unspecified: Secondary | ICD-10-CM

## 2022-10-13 DIAGNOSIS — R234 Changes in skin texture: Secondary | ICD-10-CM

## 2022-10-13 DIAGNOSIS — R200 Anesthesia of skin: Secondary | ICD-10-CM

## 2022-10-13 DIAGNOSIS — F411 Generalized anxiety disorder: Secondary | ICD-10-CM

## 2022-10-13 DIAGNOSIS — G6289 Other specified polyneuropathies: Secondary | ICD-10-CM

## 2022-10-13 DIAGNOSIS — K529 Noninfective gastroenteritis and colitis, unspecified: Secondary | ICD-10-CM

## 2022-10-13 DIAGNOSIS — E440 Moderate protein-calorie malnutrition: Secondary | ICD-10-CM

## 2022-10-13 DIAGNOSIS — E039 Hypothyroidism, unspecified: Secondary | ICD-10-CM

## 2022-10-13 DIAGNOSIS — T50905A Adverse effect of unspecified drugs, medicaments and biological substances, initial encounter: Secondary | ICD-10-CM

## 2022-10-13 DIAGNOSIS — E781 Pure hyperglyceridemia: Secondary | ICD-10-CM

## 2022-10-13 MED ORDER — PANTOPRAZOLE SODIUM 40 MG PO TBEC: 40.0000 mg | DELAYED_RELEASE_TABLET | Freq: Every day | ORAL | 3 refills | Status: DC

## 2022-10-13 MED ORDER — GABAPENTIN 400 MG PO CAPS: ORAL_CAPSULE | ORAL | 4 refills | Status: DC

## 2022-10-13 MED ORDER — VALACYCLOVIR HCL 1 G PO TABS: 2000.0000 mg | ORAL_TABLET | Freq: Two times a day (BID) | ORAL | 3 refills | Status: DC

## 2022-10-13 MED ORDER — AMITRIPTYLINE HCL 100 MG PO TABS: 100.0000 mg | ORAL_TABLET | Freq: Every day | ORAL | 3 refills | Status: DC

## 2022-10-13 MED ORDER — ATORVASTATIN CALCIUM 40 MG PO TABS: 40.0000 mg | ORAL_TABLET | Freq: Every day | ORAL | 4 refills | Status: DC

## 2022-10-13 MED ORDER — LEVOTHYROXINE SODIUM 150 MCG PO TABS: 150.0000 ug | ORAL_TABLET | Freq: Every day | ORAL | 3 refills | Status: DC

## 2022-10-13 MED ORDER — METOPROLOL TARTRATE 100 MG PO TABS: 100.0000 mg | ORAL_TABLET | Freq: Two times a day (BID) | ORAL | 3 refills | Status: DC

## 2022-10-13 MED ORDER — UREA 20 % TOPICAL CREAM
Freq: Three times a day (TID) | TOPICAL | 3 refills | 0 days | Status: CP
Start: 2022-10-13 — End: 2023-10-13

## 2022-10-13 NOTE — Patient Instructions (Addendum)
Medicines refilled today.  Start taking pantoprazole '40mg'$  daily for heartburn in place of prilosec.  Stop fish oil.  Stop pepcid.  Will see if oncology doctor can order thyroid level sometime in April.

## 2022-10-13 NOTE — Progress Notes (Incomplete)
Patient ID: Patrick Bailey., male    DOB: 1961-07-22, 62 y.o.   MRN: [id number]  This visit was conducted in person.  BP 116/68   Pulse 86   Temp (!) 97.4 F (36.3 C) (Temporal)   Ht 6' (1.829 m)   Wt 185 lb (83.9 kg)   SpO2 97%   BMI 25.09 kg/m    CC: hosp f/u visit  Subjective:   HPI: Patrick Bailey. is a 62 y.o. male presenting on 10/13/2022 for Hospitalization Follow-up (Admitted on 09/28/22 at [facility], dx AKI; small bowel obstruction. )   I last saw patient 03/2022.   Recent hospitalization for severe abdominal pain associated with chronic diarrhea in setting of FOLFOX chemotherapy for peritoneal carcinomatosis. Found to have ileus vs partial SBO. Followed by gen surgery, conservatively managed with NPO and NG tube.  Hospital records reviewed. Med rec performed.   Latest CXR in hospital showed concern for progressive multifocal pneumonia however he never had fever, dyspnea, cough.   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 Initial R hemicolectomy 11/2021 followed by chemotherapy capecitabine x8 cycles, recurrent appendiceal with peritoneal carcinomatosis diagnosed 08/2022 - started on FOLFOX at that time. Bevacizumab held due to h/o CVA 07/2022 s/p PCI to R carotid artery. Last infusion (only 5FU) done yesterday.   Ongoing watery diarrhea since 12/2021 - once daily and twice nightly.  He's taking amitriptyline '100mg'$  nightly - requests refilled.  Recent TSH very high - he notes he's missed some levothyroxine doses. He notes worsened GERD despite pepcid '20mg'$  BID so he's been taking prilosec OTC daily instead.   Home health not set up.  Other follow up appointments scheduled: continues seeing hem/onc at [facility] regularly ______________________________________________________________________ Hospital admission: 09/28/2022 Hospital discharge: 10/05/2022 TCM f/u phone call:  performed  10/06/2022  Recommendations for Outpatient Follow-up:  Follow up with PCP in 1-2 weeks Follow up with surgery at [facility] as scheduled Continue medications as noted below   Discharge Diagnoses:  Principal discharge diagnosis: Ileus vs PSBO resolved in setting of colon cancer with peritoneal carcinomatosis.  Active Problems:   SIRS (systemic inflammatory response syndrome) (HCC)   Lactic acidosis   UTI (urinary tract infection)   AKI (acute kidney injury) ([place])   Malignant neoplasm of appendix (HCC)   Hypokalemia   Hypomagnesemia   Essential hypertension   Hypothyroidism   History of alcohol abuse   GERD (gastroesophageal reflux disease)   Malnutrition of moderate degree  Home Health:None Equipment/Devices:None Discharge Condition:Stable CODE STATUS: Full Diet recommendation: Heart Healthy/soft     Relevant past medical, surgical, family and social history reviewed and updated as indicated. Interim medical history since our last visit reviewed. Allergies and medications reviewed and updated. Outpatient Medications Prior to Visit  Medication Sig Dispense Refill  . amitriptyline (ELAVIL) 50 MG tablet TAKE 1 & 1/2 (ONE & ONE-HALF) TABLETS BY MOUTH AT BEDTIME (Patient taking differently: Take 50 mg by mouth in the morning.) 135 tablet 1  . amLODipine (NORVASC) 10 MG tablet Take 1 tablet by mouth once daily 90 tablet 2  . aspirin EC 81 MG tablet Take 1 tablet (81 mg total) by mouth daily. Swallow whole. 30 tablet 2  . atorvastatin (LIPITOR) 40 MG tablet Take 1 tablet by mouth once daily (Patient taking differently: Take 40 mg by mouth daily.) 90 tablet 3  . clopidogrel (PLAVIX) 75 MG tablet Take 1 tablet by mouth once daily (Patient taking  differently: Take 75 mg by mouth daily.) 90 tablet 3  . DENTAGEL 1.1 % GEL dental gel PLACE 1 APPLICATION ONTO TEETH AT BEDTIME. (Patient taking differently: Place 1 Application onto teeth daily.) 56 g 7  . famotidine (PEPCID) 20 MG tablet Take 1 tablet  (20 mg total) by mouth 2 (two) times daily.    . fenofibrate 160 MG tablet Take 1 tablet by mouth once daily (Patient taking differently: Take 160 mg by mouth daily.) 90 tablet 3  . gabapentin (NEURONTIN) 400 MG capsule TAKE 1 CAPSULE BY MOUTH TWICE DAILY AND 3 AT BEDTIME (Patient taking differently: Take 400 mg by mouth 3 (three) times daily. Morning 1 tablet, lunch 2 tablets, dinner 3 tabs.) 150 capsule 0  . levothyroxine (SYNTHROID) 150 MCG tablet Take 1 tablet by mouth once daily (Patient taking differently: Take 150 mcg by mouth daily before breakfast.) 90 tablet 3  . metoprolol tartrate (LOPRESSOR) 100 MG tablet Take 1 tablet (100 mg total) by mouth 2 (two) times daily. 180 tablet 3  . MITIGARE 0.6 MG CAPS Take 0.6 mg by mouth daily as needed (gout flare). TAKE 1 CAPSULE BY MOUTH IN THE MORNING AS NEEDED (Patient taking differently: Take 0.6 mg by mouth daily as needed (gout flare).)    . Omega-3 Fatty Acids (FISH OIL) 1000 MG CAPS Take 2 capsules (2,000 mg total) by mouth 2 (two) times a day. (Patient taking differently: Take 1 capsule by mouth daily.)  0  . ondansetron (ZOFRAN) 4 MG tablet Take 1 tablet (4 mg total) by mouth daily as needed for nausea or vomiting. 30 tablet 1  . valACYclovir (VALTREX) 1000 MG tablet TAKE 2 TABLETS BY MOUTH TWICE DAILY FOR 1 DAY PER EPISODE (Patient taking differently: Take 2,000 mg by mouth daily. TAKE 2 TABLETS BY MOUTH TWICE DAILY FOR  1  DAY  PER  EPISODE) 20 tablet 1   No facility-administered medications prior to visit.     Per HPI unless specifically indicated in ROS section below Review of Systems  Objective:  BP 116/68   Pulse 86   Temp (!) 97.4 F (36.3 C) (Temporal)   Ht 6' (1.829 m)   Wt 185 lb (83.9 kg)   SpO2 97%   BMI 25.09 kg/m   Wt Readings from Last 3 Encounters:  10/13/22 185 lb (83.9 kg)  10/04/22 188 lb 15 oz (85.7 kg)  08/10/22 198 lb (89.8 kg)      Physical Exam Vitals and nursing note reviewed.  Constitutional:       Appearance: Normal appearance. He is ill-appearing (chronic).  HENT:     Mouth/Throat:     Mouth: Mucous membranes are moist.     Pharynx: Oropharynx is clear. No oropharyngeal exudate or posterior oropharyngeal erythema.     Comments: Upper dentures in place Eyes:     Extraocular Movements: Extraocular movements intact.     Pupils: Pupils are equal, round, and reactive to light.  Cardiovascular:     Rate and Rhythm: Normal rate and regular rhythm.     Pulses: Normal pulses.     Heart sounds: Normal heart sounds. No murmur heard.    Comments: Port in place to R upper chest Pulmonary:     Effort: Pulmonary effort is normal. No respiratory distress.     Breath sounds: Normal breath sounds. No wheezing, rhonchi or rales.  Skin:    General: Skin is warm and dry.     Findings: Rash present.     Comments:  Desquamating rash to bilateral hands at digits  Neurological:     Mental Status: He is alert.  Psychiatric:        Mood and Affect: Mood normal.        Behavior: Behavior normal.       Lab Results  Component Value Date   TSH 23.597 (H) 09/28/2022    Lab Results  Component Value Date   CREATININE 0.90 10/05/2022   BUN 9 10/05/2022   NA 131 (L) 10/05/2022   K 4.7 10/05/2022   CL 99 10/05/2022   CO2 23 10/05/2022    Lab Results  Component Value Date   WBC 12.1 (H) 10/04/2022   HGB 10.4 (L) 10/04/2022   HCT 30.0 (L) 10/04/2022   MCV 94.0 10/04/2022   PLT 277 10/04/2022    Lab Results  Component Value Date   CHOL 127 07/02/2022   HDL 37 (L) 07/02/2022   LDLCALC 30 07/02/2022   LDLDIRECT 65.0 07/12/2020   TRIG 124 10/05/2022   CHOLHDL 3.4 07/02/2022    Assessment & Plan:   Problem List Items Addressed This Visit     Hypertriglyceridemia - Primary   Left leg numbness   Other Visit Diagnoses     Neuropathy            No orders of the defined types were placed in this encounter.   No orders of the defined types were placed in this encounter.   There  are no Patient Instructions on file for this visit.  Follow up plan: No follow-ups on file.  Ria Bush, MD

## 2022-10-13 NOTE — Progress Notes (Addendum)
Patient ID: Patrick Bailey., male    DOB: 02-01-1961, 62 y.o.   MRN: [id number]  This visit was conducted in person.  BP 116/68   Pulse 86   Temp (!) 97.4 F (36.3 C) (Temporal)   Ht 6' (1.829 m)   Wt 185 lb (83.9 kg)   SpO2 97%   BMI 25.09 kg/m    CC: hosp f/u visit  Subjective:   HPI: Patrick Bailey. is a 62 y.o. male presenting on 10/13/2022 for Hospitalization Follow-up (Admitted on 09/28/22 at [facility], dx AKI; small bowel obstruction. )   I last saw patient 03/2022.   Recent hospitalization for severe abdominal pain associated with chronic diarrhea in setting of FOLFOX chemotherapy for peritoneal carcinomatosis. Found to have ileus vs partial SBO. Followed by gen surgery, conservatively managed with NPO and NG tube.  Hospital records reviewed. Med rec performed.   Latest CXR in hospital showed concern for progressive multifocal pneumonia however he never had fever, dyspnea, cough.   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 Initial R hemicolectomy 11/2021 followed by chemotherapy capecitabine x8 cycles, recurrent appendiceal with peritoneal carcinomatosis diagnosed 08/2022 - started on FOLFOX at that time. Bevacizumab held due to h/o CVA 07/2022 s/p PCI to R carotid artery. Last infusion (only 5FU) done yesterday.   Ongoing watery diarrhea since 12/2021 - once daily and twice nightly.  He's taking amitriptyline '100mg'$  nightly - requests refilled.  Recent TSH very high - he notes he's missed some levothyroxine doses. ?sick euthryoid contribution. He notes worsened GERD despite pepcid '20mg'$  BID so he's been taking prilosec OTC daily instead.   Home health not set up.  Other follow up appointments scheduled: continues seeing hem/onc at [facility] regularly ______________________________________________________________________ Hospital admission: 09/28/2022 Hospital discharge: 10/05/2022 TCM f/u phone call:   performed 10/06/2022  Recommendations for Outpatient Follow-up:  Follow up with PCP in 1-2 weeks Follow up with surgery at [facility] as scheduled Continue medications as noted below   Discharge Diagnoses:  Principal discharge diagnosis: Ileus vs PSBO resolved in setting of colon cancer with peritoneal carcinomatosis.  Active Problems:   SIRS (systemic inflammatory response syndrome) (HCC)   Lactic acidosis   UTI (urinary tract infection)   AKI (acute kidney injury) ([place])   Malignant neoplasm of appendix (HCC)   Hypokalemia   Hypomagnesemia   Essential hypertension   Hypothyroidism   History of alcohol abuse   GERD (gastroesophageal reflux disease)   Malnutrition of moderate degree  Home Health:None Equipment/Devices:None Discharge Condition:Stable CODE STATUS: Full Diet recommendation: Heart Healthy/soft     Relevant past medical, surgical, family and social history reviewed and updated as indicated. Interim medical history since our last visit reviewed. Allergies and medications reviewed and updated. Outpatient Medications Prior to Visit  Medication Sig Dispense Refill   amLODipine (NORVASC) 10 MG tablet Take 1 tablet by mouth once daily 90 tablet 2   aspirin EC 81 MG tablet Take 1 tablet (81 mg total) by mouth daily. Swallow whole. 30 tablet 2   clopidogrel (PLAVIX) 75 MG tablet Take 1 tablet by mouth once daily (Patient taking differently: Take 75 mg by mouth daily.) 90 tablet 3   DENTAGEL 1.1 % GEL dental gel PLACE 1 APPLICATION ONTO TEETH AT BEDTIME. (Patient taking differently: Place 1 Application onto teeth daily.) 56 g 7   fenofibrate 160 MG tablet Take 1 tablet by mouth once daily (Patient taking differently: Take 160 mg  by mouth daily.) 90 tablet 3   MITIGARE 0.6 MG CAPS Take 0.6 mg by mouth daily as needed (gout flare). TAKE 1 CAPSULE BY MOUTH IN THE MORNING AS NEEDED (Patient taking differently: Take 0.6 mg by mouth daily as needed (gout flare).)     ondansetron (ZOFRAN) 4  MG tablet Take 1 tablet (4 mg total) by mouth daily as needed for nausea or vomiting. 30 tablet 1   amitriptyline (ELAVIL) 50 MG tablet TAKE 1 & 1/2 (ONE & ONE-HALF) TABLETS BY MOUTH AT BEDTIME (Patient taking differently: Take 50 mg by mouth in the morning.) 135 tablet 1   atorvastatin (LIPITOR) 40 MG tablet Take 1 tablet by mouth once daily (Patient taking differently: Take 40 mg by mouth daily.) 90 tablet 3   famotidine (PEPCID) 20 MG tablet Take 1 tablet (20 mg total) by mouth 2 (two) times daily.     gabapentin (NEURONTIN) 400 MG capsule TAKE 1 CAPSULE BY MOUTH TWICE DAILY AND 3 AT BEDTIME (Patient taking differently: Take 400 mg by mouth 3 (three) times daily. Morning 1 tablet, lunch 2 tablets, dinner 3 tabs.) 150 capsule 0   levothyroxine (SYNTHROID) 150 MCG tablet Take 1 tablet by mouth once daily (Patient taking differently: Take 150 mcg by mouth daily before breakfast.) 90 tablet 3   metoprolol tartrate (LOPRESSOR) 100 MG tablet Take 1 tablet (100 mg total) by mouth 2 (two) times daily. 180 tablet 3   Omega-3 Fatty Acids (FISH OIL) 1000 MG CAPS Take 2 capsules (2,000 mg total) by mouth 2 (two) times a day. (Patient taking differently: Take 1 capsule by mouth daily.)  0   valACYclovir (VALTREX) 1000 MG tablet TAKE 2 TABLETS BY MOUTH TWICE DAILY FOR 1 DAY PER EPISODE (Patient taking differently: Take 2,000 mg by mouth daily. TAKE 2 TABLETS BY MOUTH TWICE DAILY FOR  1  DAY  PER  EPISODE) 20 tablet 1   No facility-administered medications prior to visit.     Per HPI unless specifically indicated in ROS section below Review of Systems  Objective:  BP 116/68   Pulse 86   Temp (!) 97.4 F (36.3 C) (Temporal)   Ht 6' (1.829 m)   Wt 185 lb (83.9 kg)   SpO2 97%   BMI 25.09 kg/m   Wt Readings from Last 3 Encounters:  10/13/22 185 lb (83.9 kg)  10/04/22 188 lb 15 oz (85.7 kg)  08/10/22 198 lb (89.8 kg)      Physical Exam Vitals and nursing note reviewed.  Constitutional:       Appearance: Normal appearance. He is ill-appearing (chronic).  HENT:     Mouth/Throat:     Mouth: Mucous membranes are moist.     Pharynx: Oropharynx is clear. No oropharyngeal exudate or posterior oropharyngeal erythema.     Comments: Upper dentures in place Eyes:     Extraocular Movements: Extraocular movements intact.     Pupils: Pupils are equal, round, and reactive to light.  Cardiovascular:     Rate and Rhythm: Normal rate and regular rhythm.     Pulses: Normal pulses.     Heart sounds: Normal heart sounds. No murmur heard.    Comments: Port in place to R upper chest Pulmonary:     Effort: Pulmonary effort is normal. No respiratory distress.     Breath sounds: Normal breath sounds. No wheezing, rhonchi or rales.  Skin:    General: Skin is warm and dry.     Findings: Rash present.  Comments: Desquamating rash to bilateral hands at digits  Neurological:     Mental Status: He is alert.  Psychiatric:        Mood and Affect: Mood normal.        Behavior: Behavior normal.       Lab Results  Component Value Date   TSH 23.597 (H) 09/28/2022    Lab Results  Component Value Date   CREATININE 0.90 10/05/2022   BUN 9 10/05/2022   NA 131 (L) 10/05/2022   K 4.7 10/05/2022   CL 99 10/05/2022   CO2 23 10/05/2022    Lab Results  Component Value Date   WBC 12.1 (H) 10/04/2022   HGB 10.4 (L) 10/04/2022   HCT 30.0 (L) 10/04/2022   MCV 94.0 10/04/2022   PLT 277 10/04/2022    Lab Results  Component Value Date   CHOL 127 07/02/2022   HDL 37 (L) 07/02/2022   LDLCALC 30 07/02/2022   LDLDIRECT 65.0 07/12/2020   TRIG 124 10/05/2022   CHOLHDL 3.4 07/02/2022    Assessment & Plan:  I spent 45 minutes caring for this patient today face to face, reviewing labs, prior records from another facility, performing a medically appropriate examination and/or evaluation, counseling and educating the patient on medical conditions including insomnia, hypothyroidism, GERD, documenting in the  record and arranging for repeat thyroid function with oncology if able to be drawn at next labwork.   Problem List Items Addressed This Visit     Anxiety state   Relevant Medications   amitriptyline (ELAVIL) 100 MG tablet   Chronic insomnia    Ongoing difficulty, notes benefit with higher amitriptyline dose 100mg  nightly - will continue this. Discussed watching for possible side effects of higher dose including dry mouth, urinary retention.       Hypothyroidism    Recent TSH during hospitalization elevated to 24.  I've asked him to see if onc willing to check TSH in 1-2 wks, written Rx provided to patient.       Relevant Medications   levothyroxine (SYNTHROID) 150 MCG tablet   metoprolol tartrate (LOPRESSOR) 100 MG tablet   Hypertriglyceridemia    Longstanding issue well controlled on fenofibrate and atorvastatin as well as 1 fish oil daily - will hold fish oil tablets, continue Rx medications.  The ASCVD Risk score (Arnett DK, et al., 2019) failed to calculate for the following reasons:   The patient has a prior MI or stroke diagnosis       Relevant Medications   atorvastatin (LIPITOR) 40 MG tablet   metoprolol tartrate (LOPRESSOR) 100 MG tablet   History of cerebellar stroke    Continues plavix and statin      Peripheral neuropathy    He continues gabapentin 400/400/1200mg  daily. Med list updated.       Relevant Medications   gabapentin (NEURONTIN) 400 MG capsule   amitriptyline (ELAVIL) 100 MG tablet   Malignant neoplasm of appendix (HCC)    Recurred, with peritoneal carcinomatosis, back on palliative chemotherapy through [facility] oncology.       Relevant Medications   valACYclovir (VALTREX) 1000 MG tablet   GERD (gastroesophageal reflux disease)    He's been taking pepcid 20mg  BID as well as OTC prilosec - will Rx pantoprazole to avoid plavix interaction. Will hold pepcid at this time.       Relevant Medications   pantoprazole (PROTONIX) 40 MG tablet    Immunocompromised (HCC)   Ileus (HCC) - Primary    Ileus vs partial  SBO treated with NG tube with resolution.  Now back on chemo course through [facility] oncology.       Malnutrition of moderate degree   Chronic diarrhea    Thought side effect of chemotherapy - he continues imodium with partial benefit.       Drug-induced skin desquamation    Side effect of chemo.      Peritoneal carcinomatosis ([place])    Continues palliative chemotherapy.       Relevant Medications   valACYclovir (VALTREX) 1000 MG tablet     Meds ordered this encounter  Medications   atorvastatin (LIPITOR) 40 MG tablet    Sig: Take 1 tablet (40 mg total) by mouth daily.    Dispense:  90 tablet    Refill:  4   gabapentin (NEURONTIN) 400 MG capsule    Sig: Take 1 capsule (400 mg total) by mouth 2 (two) times daily AND 3 capsules (1,200 mg total) at bedtime. TAKE 1 CAPSULE BY MOUTH TWICE DAILY AND 3 AT BEDTIME Strength: 400 mg.    Dispense:  450 capsule    Refill:  4   valACYclovir (VALTREX) 1000 MG tablet    Sig: Take 2 tablets (2,000 mg total) by mouth in the morning and at bedtime. For 1 day (4 tablets per episode)    Dispense:  20 tablet    Refill:  3   amitriptyline (ELAVIL) 100 MG tablet    Sig: Take 1 tablet (100 mg total) by mouth at bedtime.    Dispense:  90 tablet    Refill:  3   levothyroxine (SYNTHROID) 150 MCG tablet    Sig: Take 1 tablet (150 mcg total) by mouth daily.    Dispense:  90 tablet    Refill:  3   metoprolol tartrate (LOPRESSOR) 100 MG tablet    Sig: Take 1 tablet (100 mg total) by mouth 2 (two) times daily.    Dispense:  180 tablet    Refill:  3   pantoprazole (PROTONIX) 40 MG tablet    Sig: Take 1 tablet (40 mg total) by mouth daily.    Dispense:  90 tablet    Refill:  3    In place of prilosec    No orders of the defined types were placed in this encounter.   Patient Instructions  Medicines refilled today.  Start taking pantoprazole 40mg  daily for heartburn in place of  prilosec.  Stop fish oil.  Stop pepcid.  Will see if oncology doctor can order thyroid level sometime in April.   Follow up plan: Return if symptoms worsen or fail to improve.  Ria Bush, MD

## 2022-10-14 ENCOUNTER — Ambulatory Visit: Admit: 2022-10-14 | Discharge: 2022-10-15 | Payer: PRIVATE HEALTH INSURANCE

## 2022-10-14 DIAGNOSIS — C181 Malignant neoplasm of appendix: Principal | ICD-10-CM

## 2022-10-14 DIAGNOSIS — C786 Secondary malignant neoplasm of retroperitoneum and peritoneum: Principal | ICD-10-CM

## 2022-10-14 DIAGNOSIS — Z452 Encounter for adjustment and management of vascular access device: Secondary | ICD-10-CM | POA: Diagnosis not present

## 2022-10-14 DIAGNOSIS — T50905A Adverse effect of unspecified drugs, medicaments and biological substances, initial encounter: Secondary | ICD-10-CM | POA: Insufficient documentation

## 2022-10-14 DIAGNOSIS — K529 Noninfective gastroenteritis and colitis, unspecified: Secondary | ICD-10-CM | POA: Insufficient documentation

## 2022-10-14 NOTE — Assessment & Plan Note (Signed)
Side effect of chemo.

## 2022-10-14 NOTE — Assessment & Plan Note (Signed)
Ileus vs partial SBO treated with NG tube with resolution.  Now back on chemo course through [facility] oncology.

## 2022-10-14 NOTE — Assessment & Plan Note (Signed)
Recurred, with peritoneal carcinomatosis, back on palliative chemotherapy through [facility] oncology.

## 2022-10-14 NOTE — Assessment & Plan Note (Signed)
He continues gabapentin 400/400/'1200mg'$  daily. Med list updated.

## 2022-10-14 NOTE — Assessment & Plan Note (Addendum)
Continues plavix and statin

## 2022-10-14 NOTE — Assessment & Plan Note (Addendum)
He's been taking pepcid '20mg'$  BID as well as OTC prilosec - will Rx pantoprazole to avoid plavix interaction. Will hold pepcid at this time.

## 2022-10-14 NOTE — Assessment & Plan Note (Signed)
Thought side effect of chemotherapy - he continues imodium with partial benefit.

## 2022-10-14 NOTE — Assessment & Plan Note (Addendum)
Recent TSH during hospitalization elevated to 24.  I've asked him to see if onc willing to check TSH in 1-2 wks, written Rx provided to patient.

## 2022-10-14 NOTE — Assessment & Plan Note (Addendum)
Longstanding issue well controlled on fenofibrate and atorvastatin as well as 1 fish oil daily - will hold fish oil tablets, continue Rx medications.  The ASCVD Risk score (Arnett DK, et al., 2019) failed to calculate for the following reasons:   The patient has a prior MI or stroke diagnosis

## 2022-10-14 NOTE — Assessment & Plan Note (Signed)
Continues palliative chemotherapy.

## 2022-10-14 NOTE — Assessment & Plan Note (Signed)
Ongoing difficulty, notes benefit with higher amitriptyline dose '100mg'$  nightly - will continue this. Discussed watching for possible side effects of higher dose including dry mouth, urinary retention.

## 2022-10-15 ENCOUNTER — Ambulatory Visit: Admit: 2022-10-15 | Discharge: 2022-10-15 | Payer: PRIVATE HEALTH INSURANCE

## 2022-10-15 ENCOUNTER — Ambulatory Visit
Admit: 2022-10-15 | Discharge: 2022-10-15 | Payer: PRIVATE HEALTH INSURANCE | Attending: Hematology & Oncology | Primary: Hematology & Oncology

## 2022-10-15 DIAGNOSIS — C786 Secondary malignant neoplasm of retroperitoneum and peritoneum: Principal | ICD-10-CM

## 2022-10-15 DIAGNOSIS — C181 Malignant neoplasm of appendix: Principal | ICD-10-CM

## 2022-10-15 DIAGNOSIS — Z8673 Personal history of transient ischemic attack (TIA), and cerebral infarction without residual deficits: Secondary | ICD-10-CM | POA: Diagnosis not present

## 2022-10-15 DIAGNOSIS — I10 Essential (primary) hypertension: Secondary | ICD-10-CM | POA: Diagnosis not present

## 2022-10-15 DIAGNOSIS — K219 Gastro-esophageal reflux disease without esophagitis: Secondary | ICD-10-CM | POA: Diagnosis not present

## 2022-10-15 DIAGNOSIS — R911 Solitary pulmonary nodule: Secondary | ICD-10-CM | POA: Diagnosis not present

## 2022-10-15 DIAGNOSIS — R197 Diarrhea, unspecified: Secondary | ICD-10-CM | POA: Diagnosis not present

## 2022-10-15 DIAGNOSIS — I69354 Hemiplegia and hemiparesis following cerebral infarction affecting left non-dominant side: Secondary | ICD-10-CM | POA: Diagnosis not present

## 2022-10-15 DIAGNOSIS — J8489 Other specified interstitial pulmonary diseases: Secondary | ICD-10-CM | POA: Diagnosis not present

## 2022-10-15 DIAGNOSIS — Z87891 Personal history of nicotine dependence: Secondary | ICD-10-CM | POA: Diagnosis not present

## 2022-10-15 DIAGNOSIS — K567 Ileus, unspecified: Secondary | ICD-10-CM | POA: Diagnosis not present

## 2022-10-15 DIAGNOSIS — I2510 Atherosclerotic heart disease of native coronary artery without angina pectoris: Secondary | ICD-10-CM | POA: Diagnosis not present

## 2022-10-15 MED ORDER — OXYCODONE 5 MG TABLET
ORAL_TABLET | Freq: Three times a day (TID) | ORAL | 0 refills | 10 days | Status: CP | PRN
Start: 2022-10-15 — End: ?

## 2022-10-19 NOTE — Addendum Note (Signed)
Addended by: Ria Bush on: 10/19/2022 05:22 PM   Modules accepted: Level of Service

## 2022-10-29 ENCOUNTER — Institutional Professional Consult (permissible substitution): Admit: 2022-10-29 | Discharge: 2022-10-30 | Payer: PRIVATE HEALTH INSURANCE

## 2022-10-29 ENCOUNTER — Ambulatory Visit: Admit: 2022-10-29 | Discharge: 2022-10-30 | Payer: PRIVATE HEALTH INSURANCE

## 2022-10-29 DIAGNOSIS — C181 Malignant neoplasm of appendix: Principal | ICD-10-CM

## 2022-10-29 DIAGNOSIS — C786 Secondary malignant neoplasm of retroperitoneum and peritoneum: Principal | ICD-10-CM

## 2022-10-29 DIAGNOSIS — Z5111 Encounter for antineoplastic chemotherapy: Secondary | ICD-10-CM | POA: Diagnosis not present

## 2022-11-12 ENCOUNTER — Ambulatory Visit: Admit: 2022-11-12 | Discharge: 2022-11-12 | Payer: PRIVATE HEALTH INSURANCE

## 2022-11-12 ENCOUNTER — Institutional Professional Consult (permissible substitution): Admit: 2022-11-12 | Discharge: 2022-11-12 | Payer: PRIVATE HEALTH INSURANCE

## 2022-11-12 DIAGNOSIS — C786 Secondary malignant neoplasm of retroperitoneum and peritoneum: Principal | ICD-10-CM

## 2022-11-12 DIAGNOSIS — Z09 Encounter for follow-up examination after completed treatment for conditions other than malignant neoplasm: Principal | ICD-10-CM

## 2022-11-12 DIAGNOSIS — C181 Malignant neoplasm of appendix: Principal | ICD-10-CM

## 2022-11-12 DIAGNOSIS — Z5111 Encounter for antineoplastic chemotherapy: Secondary | ICD-10-CM | POA: Diagnosis not present

## 2022-11-12 DIAGNOSIS — Z87891 Personal history of nicotine dependence: Secondary | ICD-10-CM | POA: Diagnosis not present

## 2022-11-12 DIAGNOSIS — Z96653 Presence of artificial knee joint, bilateral: Secondary | ICD-10-CM | POA: Diagnosis not present

## 2022-11-12 DIAGNOSIS — I69354 Hemiplegia and hemiparesis following cerebral infarction affecting left non-dominant side: Secondary | ICD-10-CM | POA: Diagnosis not present

## 2022-11-12 DIAGNOSIS — I2510 Atherosclerotic heart disease of native coronary artery without angina pectoris: Secondary | ICD-10-CM | POA: Diagnosis not present

## 2022-11-12 DIAGNOSIS — I119 Hypertensive heart disease without heart failure: Secondary | ICD-10-CM | POA: Diagnosis not present

## 2022-11-12 DIAGNOSIS — Z85810 Personal history of malignant neoplasm of tongue: Secondary | ICD-10-CM | POA: Diagnosis not present

## 2022-11-12 DIAGNOSIS — E039 Hypothyroidism, unspecified: Secondary | ICD-10-CM | POA: Diagnosis not present

## 2022-11-12 DIAGNOSIS — M5431 Sciatica, right side: Secondary | ICD-10-CM | POA: Diagnosis not present

## 2022-11-12 DIAGNOSIS — K219 Gastro-esophageal reflux disease without esophagitis: Secondary | ICD-10-CM | POA: Diagnosis not present

## 2022-11-13 DIAGNOSIS — H34231 Retinal artery branch occlusion, right eye: Secondary | ICD-10-CM | POA: Diagnosis not present

## 2022-11-13 DIAGNOSIS — H02883 Meibomian gland dysfunction of right eye, unspecified eyelid: Secondary | ICD-10-CM | POA: Diagnosis not present

## 2022-11-13 DIAGNOSIS — H25043 Posterior subcapsular polar age-related cataract, bilateral: Secondary | ICD-10-CM | POA: Diagnosis not present

## 2022-11-26 ENCOUNTER — Ambulatory Visit: Admit: 2022-11-26 | Discharge: 2022-11-27 | Payer: PRIVATE HEALTH INSURANCE

## 2022-11-26 ENCOUNTER — Institutional Professional Consult (permissible substitution): Admit: 2022-11-26 | Discharge: 2022-11-27 | Payer: PRIVATE HEALTH INSURANCE

## 2022-11-26 DIAGNOSIS — C786 Secondary malignant neoplasm of retroperitoneum and peritoneum: Principal | ICD-10-CM

## 2022-11-26 DIAGNOSIS — C181 Malignant neoplasm of appendix: Principal | ICD-10-CM

## 2022-11-26 DIAGNOSIS — Z7982 Long term (current) use of aspirin: Secondary | ICD-10-CM | POA: Diagnosis not present

## 2022-11-26 DIAGNOSIS — R197 Diarrhea, unspecified: Secondary | ICD-10-CM | POA: Diagnosis not present

## 2022-11-26 DIAGNOSIS — T451X5A Adverse effect of antineoplastic and immunosuppressive drugs, initial encounter: Secondary | ICD-10-CM | POA: Diagnosis not present

## 2022-11-26 DIAGNOSIS — R112 Nausea with vomiting, unspecified: Secondary | ICD-10-CM | POA: Diagnosis not present

## 2022-11-26 DIAGNOSIS — Z5111 Encounter for antineoplastic chemotherapy: Secondary | ICD-10-CM | POA: Diagnosis not present

## 2022-11-26 DIAGNOSIS — Z7902 Long term (current) use of antithrombotics/antiplatelets: Secondary | ICD-10-CM | POA: Diagnosis not present

## 2022-12-04 DIAGNOSIS — C181 Malignant neoplasm of appendix: Principal | ICD-10-CM

## 2022-12-10 ENCOUNTER — Ambulatory Visit: Admit: 2022-12-10 | Discharge: 2022-12-10 | Payer: PRIVATE HEALTH INSURANCE

## 2022-12-10 ENCOUNTER — Institutional Professional Consult (permissible substitution): Admit: 2022-12-10 | Discharge: 2022-12-10 | Payer: PRIVATE HEALTH INSURANCE

## 2022-12-10 DIAGNOSIS — Z09 Encounter for follow-up examination after completed treatment for conditions other than malignant neoplasm: Principal | ICD-10-CM

## 2022-12-10 DIAGNOSIS — C786 Secondary malignant neoplasm of retroperitoneum and peritoneum: Principal | ICD-10-CM

## 2022-12-10 DIAGNOSIS — Z8673 Personal history of transient ischemic attack (TIA), and cerebral infarction without residual deficits: Principal | ICD-10-CM

## 2022-12-10 DIAGNOSIS — T451X5A Adverse effect of antineoplastic and immunosuppressive drugs, initial encounter: Principal | ICD-10-CM

## 2022-12-10 DIAGNOSIS — C181 Malignant neoplasm of appendix: Principal | ICD-10-CM

## 2022-12-10 DIAGNOSIS — G620 Drug-induced polyneuropathy: Principal | ICD-10-CM

## 2022-12-10 DIAGNOSIS — Z885 Allergy status to narcotic agent status: Secondary | ICD-10-CM | POA: Diagnosis not present

## 2022-12-10 DIAGNOSIS — I10 Essential (primary) hypertension: Secondary | ICD-10-CM | POA: Diagnosis not present

## 2022-12-10 DIAGNOSIS — K219 Gastro-esophageal reflux disease without esophagitis: Secondary | ICD-10-CM | POA: Diagnosis not present

## 2022-12-10 DIAGNOSIS — I2510 Atherosclerotic heart disease of native coronary artery without angina pectoris: Secondary | ICD-10-CM | POA: Diagnosis not present

## 2022-12-10 DIAGNOSIS — Z87891 Personal history of nicotine dependence: Secondary | ICD-10-CM | POA: Diagnosis not present

## 2022-12-10 DIAGNOSIS — E039 Hypothyroidism, unspecified: Secondary | ICD-10-CM | POA: Diagnosis not present

## 2022-12-22 ENCOUNTER — Ambulatory Visit: Admit: 2022-12-22 | Discharge: 2022-12-22 | Payer: PRIVATE HEALTH INSURANCE

## 2022-12-22 DIAGNOSIS — J8489 Other specified interstitial pulmonary diseases: Secondary | ICD-10-CM | POA: Diagnosis not present

## 2022-12-22 DIAGNOSIS — C786 Secondary malignant neoplasm of retroperitoneum and peritoneum: Secondary | ICD-10-CM | POA: Diagnosis not present

## 2022-12-22 DIAGNOSIS — C181 Malignant neoplasm of appendix: Secondary | ICD-10-CM | POA: Diagnosis not present

## 2022-12-24 ENCOUNTER — Ambulatory Visit: Admit: 2022-12-24 | Discharge: 2022-12-24 | Payer: PRIVATE HEALTH INSURANCE

## 2022-12-24 ENCOUNTER — Ambulatory Visit
Admit: 2022-12-24 | Discharge: 2022-12-24 | Payer: PRIVATE HEALTH INSURANCE | Attending: Hematology & Oncology | Primary: Hematology & Oncology

## 2022-12-24 ENCOUNTER — Institutional Professional Consult (permissible substitution): Admit: 2022-12-24 | Discharge: 2022-12-24 | Payer: PRIVATE HEALTH INSURANCE

## 2022-12-24 DIAGNOSIS — C786 Secondary malignant neoplasm of retroperitoneum and peritoneum: Principal | ICD-10-CM

## 2022-12-24 DIAGNOSIS — C181 Malignant neoplasm of appendix: Principal | ICD-10-CM

## 2022-12-24 DIAGNOSIS — I10 Essential (primary) hypertension: Secondary | ICD-10-CM | POA: Diagnosis not present

## 2022-12-24 DIAGNOSIS — Z8673 Personal history of transient ischemic attack (TIA), and cerebral infarction without residual deficits: Secondary | ICD-10-CM | POA: Diagnosis not present

## 2022-12-24 DIAGNOSIS — Z885 Allergy status to narcotic agent status: Secondary | ICD-10-CM | POA: Diagnosis not present

## 2022-12-24 DIAGNOSIS — Z87891 Personal history of nicotine dependence: Secondary | ICD-10-CM | POA: Diagnosis not present

## 2023-01-06 MED ORDER — CLINDAMYCIN 1 % TOPICAL GEL
Freq: Two times a day (BID) | TOPICAL | prn refills | 0 days | Status: CP
Start: 2023-01-06 — End: ?

## 2023-01-06 MED ORDER — PROCHLORPERAZINE MALEATE 10 MG TABLET
ORAL_TABLET | Freq: Four times a day (QID) | ORAL | 3 refills | 15 days | Status: CP | PRN
Start: 2023-01-06 — End: ?

## 2023-01-06 MED ORDER — DOXYCYCLINE MONOHYDRATE 100 MG CAPSULE
ORAL_CAPSULE | Freq: Two times a day (BID) | ORAL | 5 refills | 30 days | Status: CP
Start: 2023-01-06 — End: ?

## 2023-01-07 ENCOUNTER — Institutional Professional Consult (permissible substitution): Admit: 2023-01-07 | Discharge: 2023-01-08 | Payer: PRIVATE HEALTH INSURANCE

## 2023-01-07 ENCOUNTER — Ambulatory Visit: Admit: 2023-01-07 | Discharge: 2023-01-08 | Payer: PRIVATE HEALTH INSURANCE

## 2023-01-07 DIAGNOSIS — C181 Malignant neoplasm of appendix: Principal | ICD-10-CM

## 2023-01-07 DIAGNOSIS — C786 Secondary malignant neoplasm of retroperitoneum and peritoneum: Principal | ICD-10-CM

## 2023-01-07 DIAGNOSIS — Z5112 Encounter for antineoplastic immunotherapy: Secondary | ICD-10-CM | POA: Diagnosis not present

## 2023-01-07 MED ORDER — CLINDAMYCIN 1 % TOPICAL GEL
Freq: Two times a day (BID) | TOPICAL | prn refills | 0 days | Status: CP
Start: 2023-01-07 — End: ?

## 2023-01-07 MED ORDER — PROCHLORPERAZINE MALEATE 10 MG TABLET
ORAL_TABLET | Freq: Four times a day (QID) | ORAL | 3 refills | 15 days | Status: CP | PRN
Start: 2023-01-07 — End: ?

## 2023-01-07 MED ORDER — DOXYCYCLINE MONOHYDRATE 100 MG CAPSULE
ORAL_CAPSULE | Freq: Two times a day (BID) | ORAL | 5 refills | 30 days | Status: CP
Start: 2023-01-07 — End: ?

## 2023-01-08 ENCOUNTER — Telehealth: Payer: Self-pay

## 2023-01-08 ENCOUNTER — Other Ambulatory Visit (HOSPITAL_COMMUNITY): Payer: Self-pay

## 2023-01-08 NOTE — Telephone Encounter (Signed)
A Prior Authorization was initiated for this patients PANTOPRAZOLE through CoverMyMeds.   Key: [id number]

## 2023-01-21 ENCOUNTER — Institutional Professional Consult (permissible substitution): Admit: 2023-01-21 | Discharge: 2023-01-21 | Payer: PRIVATE HEALTH INSURANCE

## 2023-01-21 ENCOUNTER — Ambulatory Visit: Admit: 2023-01-21 | Discharge: 2023-01-21 | Payer: PRIVATE HEALTH INSURANCE

## 2023-01-21 DIAGNOSIS — C181 Malignant neoplasm of appendix: Principal | ICD-10-CM

## 2023-01-21 DIAGNOSIS — C786 Secondary malignant neoplasm of retroperitoneum and peritoneum: Principal | ICD-10-CM

## 2023-01-21 DIAGNOSIS — E876 Hypokalemia: Principal | ICD-10-CM

## 2023-01-21 DIAGNOSIS — Z5112 Encounter for antineoplastic immunotherapy: Secondary | ICD-10-CM | POA: Diagnosis not present

## 2023-01-25 NOTE — Telephone Encounter (Signed)
Previous key expired. Renewed request.  Key: [id number]

## 2023-01-26 NOTE — Telephone Encounter (Signed)
PA has been APPROVED from 01/25/2023-01/25/2024

## 2023-02-05 ENCOUNTER — Institutional Professional Consult (permissible substitution): Admit: 2023-02-05 | Discharge: 2023-02-05 | Payer: PRIVATE HEALTH INSURANCE

## 2023-02-05 ENCOUNTER — Ambulatory Visit: Admit: 2023-02-05 | Discharge: 2023-02-05 | Payer: PRIVATE HEALTH INSURANCE

## 2023-02-05 DIAGNOSIS — C181 Malignant neoplasm of appendix: Principal | ICD-10-CM

## 2023-02-05 DIAGNOSIS — E876 Hypokalemia: Principal | ICD-10-CM

## 2023-02-05 DIAGNOSIS — C786 Secondary malignant neoplasm of retroperitoneum and peritoneum: Principal | ICD-10-CM

## 2023-02-05 DIAGNOSIS — Z5112 Encounter for antineoplastic immunotherapy: Secondary | ICD-10-CM | POA: Diagnosis not present

## 2023-02-18 ENCOUNTER — Other Ambulatory Visit: Payer: Self-pay | Admitting: Family Medicine

## 2023-02-18 ENCOUNTER — Ambulatory Visit: Admit: 2023-02-18 | Discharge: 2023-02-19 | Payer: PRIVATE HEALTH INSURANCE

## 2023-02-18 ENCOUNTER — Institutional Professional Consult (permissible substitution): Admit: 2023-02-18 | Discharge: 2023-02-19 | Payer: PRIVATE HEALTH INSURANCE

## 2023-02-18 DIAGNOSIS — C181 Malignant neoplasm of appendix: Principal | ICD-10-CM

## 2023-02-18 DIAGNOSIS — C786 Secondary malignant neoplasm of retroperitoneum and peritoneum: Principal | ICD-10-CM

## 2023-02-18 DIAGNOSIS — Z5112 Encounter for antineoplastic immunotherapy: Secondary | ICD-10-CM | POA: Diagnosis not present

## 2023-02-18 DIAGNOSIS — I10 Essential (primary) hypertension: Secondary | ICD-10-CM

## 2023-02-18 MED ORDER — PROCHLORPERAZINE MALEATE 10 MG TABLET
ORAL_TABLET | 0 refills | 0 days | Status: CP
Start: 2023-02-18 — End: ?

## 2023-02-18 NOTE — Telephone Encounter (Signed)
Noted  

## 2023-02-18 NOTE — Telephone Encounter (Signed)
E-scribed refill  Plz schedule CPE and fasting lab (no food/drink- except water and/or blk coffee 5 hrs prior) visits for additional refills.  

## 2023-02-18 NOTE — Telephone Encounter (Signed)
Patient has been scheduled

## 2023-02-24 DIAGNOSIS — C786 Secondary malignant neoplasm of retroperitoneum and peritoneum: Principal | ICD-10-CM

## 2023-02-24 DIAGNOSIS — C181 Malignant neoplasm of appendix: Principal | ICD-10-CM

## 2023-03-01 DIAGNOSIS — C181 Malignant neoplasm of appendix: Principal | ICD-10-CM

## 2023-03-04 ENCOUNTER — Ambulatory Visit: Admit: 2023-03-04 | Discharge: 2023-03-05 | Payer: PRIVATE HEALTH INSURANCE

## 2023-03-04 ENCOUNTER — Ambulatory Visit
Admit: 2023-03-04 | Discharge: 2023-03-05 | Payer: PRIVATE HEALTH INSURANCE | Attending: Hematology & Oncology | Primary: Hematology & Oncology

## 2023-03-04 ENCOUNTER — Institutional Professional Consult (permissible substitution): Admit: 2023-03-04 | Discharge: 2023-03-05 | Payer: PRIVATE HEALTH INSURANCE

## 2023-03-04 DIAGNOSIS — C181 Malignant neoplasm of appendix: Principal | ICD-10-CM

## 2023-03-04 DIAGNOSIS — C786 Secondary malignant neoplasm of retroperitoneum and peritoneum: Principal | ICD-10-CM

## 2023-03-04 DIAGNOSIS — Z87891 Personal history of nicotine dependence: Secondary | ICD-10-CM | POA: Diagnosis not present

## 2023-03-04 DIAGNOSIS — K219 Gastro-esophageal reflux disease without esophagitis: Secondary | ICD-10-CM | POA: Diagnosis not present

## 2023-03-04 DIAGNOSIS — Z885 Allergy status to narcotic agent status: Secondary | ICD-10-CM | POA: Diagnosis not present

## 2023-03-04 DIAGNOSIS — Z5111 Encounter for antineoplastic chemotherapy: Secondary | ICD-10-CM | POA: Diagnosis not present

## 2023-03-04 DIAGNOSIS — E039 Hypothyroidism, unspecified: Secondary | ICD-10-CM | POA: Diagnosis not present

## 2023-03-04 DIAGNOSIS — I2510 Atherosclerotic heart disease of native coronary artery without angina pectoris: Secondary | ICD-10-CM | POA: Diagnosis not present

## 2023-03-04 DIAGNOSIS — I10 Essential (primary) hypertension: Secondary | ICD-10-CM | POA: Diagnosis not present

## 2023-03-04 DIAGNOSIS — F1090 Alcohol use, unspecified, uncomplicated: Secondary | ICD-10-CM | POA: Diagnosis not present

## 2023-03-04 MED ORDER — MAGNESIUM OXIDE 400 MG (241.3 MG MAGNESIUM) TABLET
ORAL_TABLET | Freq: Two times a day (BID) | ORAL | 11 refills | 30 days | Status: CP
Start: 2023-03-04 — End: ?

## 2023-03-04 MED ORDER — OXYCODONE 5 MG TABLET
ORAL_TABLET | Freq: Four times a day (QID) | ORAL | 0 refills | 23 days | Status: CP | PRN
Start: 2023-03-04 — End: ?

## 2023-03-10 ENCOUNTER — Other Ambulatory Visit (HOSPITAL_COMMUNITY): Payer: Self-pay | Admitting: Interventional Radiology

## 2023-03-10 ENCOUNTER — Telehealth (HOSPITAL_COMMUNITY): Payer: Self-pay

## 2023-03-10 DIAGNOSIS — I6521 Occlusion and stenosis of right carotid artery: Secondary | ICD-10-CM

## 2023-03-10 NOTE — Telephone Encounter (Signed)
Called to schedule US carotid, no answer, no vm. AB

## 2023-03-16 ENCOUNTER — Ambulatory Visit: Admit: 2023-03-16 | Discharge: 2023-03-17 | Payer: PRIVATE HEALTH INSURANCE

## 2023-03-16 DIAGNOSIS — R935 Abnormal findings on diagnostic imaging of other abdominal regions, including retroperitoneum: Secondary | ICD-10-CM | POA: Diagnosis not present

## 2023-03-16 DIAGNOSIS — I2510 Atherosclerotic heart disease of native coronary artery without angina pectoris: Secondary | ICD-10-CM | POA: Diagnosis not present

## 2023-03-16 DIAGNOSIS — J984 Other disorders of lung: Secondary | ICD-10-CM | POA: Diagnosis not present

## 2023-03-16 DIAGNOSIS — R918 Other nonspecific abnormal finding of lung field: Secondary | ICD-10-CM | POA: Diagnosis not present

## 2023-03-16 DIAGNOSIS — C181 Malignant neoplasm of appendix: Secondary | ICD-10-CM | POA: Diagnosis not present

## 2023-03-16 DIAGNOSIS — R933 Abnormal findings on diagnostic imaging of other parts of digestive tract: Secondary | ICD-10-CM | POA: Diagnosis not present

## 2023-03-17 ENCOUNTER — Encounter: Payer: Self-pay | Admitting: Internal Medicine

## 2023-03-17 ENCOUNTER — Ambulatory Visit: Payer: BC Managed Care – PPO | Admitting: Internal Medicine

## 2023-03-17 VITALS — BP 130/86 | HR 54 | Temp 97.2°F | Ht 72.0 in | Wt 200.0 lb

## 2023-03-17 DIAGNOSIS — J45909 Unspecified asthma, uncomplicated: Secondary | ICD-10-CM | POA: Insufficient documentation

## 2023-03-17 DIAGNOSIS — J45901 Unspecified asthma with (acute) exacerbation: Secondary | ICD-10-CM

## 2023-03-17 MED ORDER — AZITHROMYCIN 250 MG PO TABS: ORAL_TABLET | ORAL | 0 refills | Status: DC

## 2023-03-17 MED ORDER — PREDNISONE 20 MG PO TABS: 40.0000 mg | ORAL_TABLET | Freq: Every day | ORAL | 0 refills | Status: DC
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
## 2023-03-17 NOTE — Assessment & Plan Note (Signed)
Has productive cough and slight wheeze Doesn't seem to have pneumonia Will treat with  z-pak Prednisone 40 daily x 3 days Robitussin DM and tylenol prn

## 2023-03-18 ENCOUNTER — Ambulatory Visit: Admit: 2023-03-18 | Discharge: 2023-03-18 | Payer: PRIVATE HEALTH INSURANCE

## 2023-03-18 ENCOUNTER — Ambulatory Visit
Admit: 2023-03-18 | Discharge: 2023-03-18 | Payer: PRIVATE HEALTH INSURANCE | Attending: Hematology & Oncology | Primary: Hematology & Oncology

## 2023-03-18 ENCOUNTER — Institutional Professional Consult (permissible substitution): Admit: 2023-03-18 | Discharge: 2023-03-18 | Payer: PRIVATE HEALTH INSURANCE

## 2023-03-18 DIAGNOSIS — Z79899 Other long term (current) drug therapy: Principal | ICD-10-CM

## 2023-03-18 DIAGNOSIS — C786 Secondary malignant neoplasm of retroperitoneum and peritoneum: Principal | ICD-10-CM

## 2023-03-18 DIAGNOSIS — Z09 Encounter for follow-up examination after completed treatment for conditions other than malignant neoplasm: Principal | ICD-10-CM

## 2023-03-18 DIAGNOSIS — C181 Malignant neoplasm of appendix: Principal | ICD-10-CM

## 2023-03-18 DIAGNOSIS — K219 Gastro-esophageal reflux disease without esophagitis: Secondary | ICD-10-CM | POA: Diagnosis not present

## 2023-03-18 DIAGNOSIS — I2510 Atherosclerotic heart disease of native coronary artery without angina pectoris: Secondary | ICD-10-CM | POA: Diagnosis not present

## 2023-03-18 DIAGNOSIS — E039 Hypothyroidism, unspecified: Secondary | ICD-10-CM | POA: Diagnosis not present

## 2023-03-18 DIAGNOSIS — I10 Essential (primary) hypertension: Secondary | ICD-10-CM | POA: Diagnosis not present

## 2023-03-18 DIAGNOSIS — Z87891 Personal history of nicotine dependence: Secondary | ICD-10-CM | POA: Diagnosis not present

## 2023-03-22 ENCOUNTER — Ambulatory Visit (HOSPITAL_COMMUNITY)
Admission: RE | Admit: 2023-03-22 | Discharge: 2023-03-22 | Disposition: A | Discharge status: Home / Self Care | Payer: BC Managed Care – PPO | Source: Ambulatory Visit | Attending: Interventional Radiology | Admitting: Interventional Radiology

## 2023-03-22 DIAGNOSIS — I6521 Occlusion and stenosis of right carotid artery: Secondary | ICD-10-CM | POA: Diagnosis not present

## 2023-03-22 NOTE — Progress Notes (Signed)
VASCULAR LAB    Carotid duplex has been performed.  See CV proc for preliminary results.   Debra Colon, RVT 03/22/2023, 10:34 AM

## 2023-03-23 MED ORDER — TRIFLURIDINE 15 MG-TIPIRACIL 6.14 MG TABLET
ORAL_TABLET | Freq: Two times a day (BID) | ORAL | 6 refills | 10 days | Status: CP
Start: 2023-03-23 — End: ?
  Filled 2023-04-14: qty 100, 28d supply, fill #0

## 2023-03-24 DIAGNOSIS — C181 Malignant neoplasm of appendix: Principal | ICD-10-CM

## 2023-03-24 DIAGNOSIS — C786 Secondary malignant neoplasm of retroperitoneum and peritoneum: Principal | ICD-10-CM

## 2023-03-25 ENCOUNTER — Telehealth (HOSPITAL_COMMUNITY): Payer: Self-pay

## 2023-03-25 NOTE — Telephone Encounter (Signed)
Called pt regarding recent imaging, no answer, no vm. AB 

## 2023-03-25 NOTE — Telephone Encounter (Signed)
Pt agreed to f/u in 6 months with a us carotid. AB  

## 2023-04-06 MED ORDER — OXYCODONE 5 MG TABLET
ORAL_TABLET | Freq: Four times a day (QID) | ORAL | 0 refills | 23 days | Status: CP | PRN
Start: 2023-04-06 — End: ?

## 2023-04-09 NOTE — Unmapped (Signed)
[facility] Specialty Medication Onboarding    Specialty Medication: Hydrographic surveyor  Prior Authorization: Approved   Financial Assistance: No - copay  <$25  Final Copay/Day Supply: $0 / 28    Insurance Restrictions: None     Notes to Pharmacist:   Credit Card on File: not applicable    The triage team has completed the benefits investigation and has determined that the patient is able to fill this medication at [facility]. Please contact the patient to complete the onboarding or follow up with the prescribing physician as needed.

## 2023-04-12 ENCOUNTER — Other Ambulatory Visit: Payer: Self-pay

## 2023-04-12 ENCOUNTER — Emergency Department (HOSPITAL_COMMUNITY)
Admission: EM | Admit: 2023-04-12 | Discharge: 2023-04-12 | Disposition: A | Discharge status: Home / Self Care | Payer: BC Managed Care – PPO | Attending: Emergency Medicine | Admitting: Emergency Medicine

## 2023-04-12 ENCOUNTER — Emergency Department (HOSPITAL_COMMUNITY): Payer: BC Managed Care – PPO

## 2023-04-12 DIAGNOSIS — R109 Unspecified abdominal pain: Secondary | ICD-10-CM | POA: Diagnosis not present

## 2023-04-12 DIAGNOSIS — R1084 Generalized abdominal pain: Secondary | ICD-10-CM | POA: Diagnosis not present

## 2023-04-12 DIAGNOSIS — C800 Disseminated malignant neoplasm, unspecified: Secondary | ICD-10-CM | POA: Insufficient documentation

## 2023-04-12 DIAGNOSIS — C787 Secondary malignant neoplasm of liver and intrahepatic bile duct: Secondary | ICD-10-CM | POA: Diagnosis not present

## 2023-04-12 DIAGNOSIS — R000 Tachycardia, unspecified: Secondary | ICD-10-CM | POA: Insufficient documentation

## 2023-04-12 DIAGNOSIS — Z7982 Long term (current) use of aspirin: Secondary | ICD-10-CM | POA: Diagnosis not present

## 2023-04-12 DIAGNOSIS — C189 Malignant neoplasm of colon, unspecified: Secondary | ICD-10-CM | POA: Diagnosis not present

## 2023-04-12 DIAGNOSIS — R1111 Vomiting without nausea: Secondary | ICD-10-CM | POA: Diagnosis not present

## 2023-04-12 LAB — CBC WITH DIFFERENTIAL/PLATELET
Abs Immature Granulocytes: 0.05 10*3/uL (ref 0.00–0.07)
Basophils Absolute: 0 10*3/uL (ref 0.0–0.1)
Basophils Relative: 0 %
Eosinophils Absolute: 0.1 10*3/uL (ref 0.0–0.5)
Eosinophils Relative: 1 %
HCT: 46.2 % (ref 39.0–52.0)
Hemoglobin: 15.1 g/dL (ref 13.0–17.0)
Immature Granulocytes: 1 %
Lymphocytes Relative: 12 %
Lymphs Abs: 1.1 10*3/uL (ref 0.7–4.0)
MCH: 29.4 pg (ref 26.0–34.0)
MCHC: 32.7 g/dL (ref 30.0–36.0)
MCV: 89.9 fL (ref 80.0–100.0)
Monocytes Absolute: 0.9 10*3/uL (ref 0.1–1.0)
Monocytes Relative: 9 %
Neutro Abs: 6.9 10*3/uL (ref 1.7–7.7)
Neutrophils Relative %: 77 %
Platelets: 531 10*3/uL — ABNORMAL HIGH (ref 150–400)
RBC: 5.14 MIL/uL (ref 4.22–5.81)
RDW: 12.2 % (ref 11.5–15.5)
WBC: 9.1 10*3/uL (ref 4.0–10.5)
nRBC: 0 % (ref 0.0–0.2)

## 2023-04-12 LAB — I-STAT CG4 LACTIC ACID, ED: Lactic Acid, Venous: 1.6 mmol/L (ref 0.5–1.9)

## 2023-04-12 LAB — COMPREHENSIVE METABOLIC PANEL
ALT: 14 U/L (ref 0–44)
AST: 17 U/L (ref 15–41)
Albumin: 3.4 g/dL — ABNORMAL LOW (ref 3.5–5.0)
Alkaline Phosphatase: 83 U/L (ref 38–126)
Anion gap: 11 (ref 5–15)
BUN: 8 mg/dL (ref 8–23)
CO2: 27 mmol/L (ref 22–32)
Calcium: 9.5 mg/dL (ref 8.9–10.3)
Chloride: 95 mmol/L — ABNORMAL LOW (ref 98–111)
Creatinine, Ser: 0.98 mg/dL (ref 0.61–1.24)
GFR, Estimated: >60 mL/min (ref >60)
Glucose, Bld: 122 mg/dL — ABNORMAL HIGH (ref 70–99)
Potassium: 4.1 mmol/L (ref 3.5–5.1)
Sodium: 133 mmol/L — ABNORMAL LOW (ref 135–145)
Total Bilirubin: 0.3 mg/dL (ref 0.3–1.2)
Total Protein: 7.3 g/dL (ref 6.5–8.1)

## 2023-04-12 LAB — LIPASE, BLOOD: Lipase: 23 U/L (ref 11–51)

## 2023-04-12 MED ORDER — FENTANYL CITRATE PF 50 MCG/ML IJ SOSY
50.0000 ug | PREFILLED_SYRINGE | Freq: Once | INTRAMUSCULAR | Status: AC
  Administered 2023-04-12: 50 ug via INTRAVENOUS
  Filled 2023-04-12: qty 1

## 2023-04-12 MED ORDER — IOHEXOL 350 MG/ML SOLN
75.0000 mL | Freq: Once | INTRAVENOUS | Status: AC | PRN
  Administered 2023-04-12: 75 mL via INTRAVENOUS

## 2023-04-12 MED ORDER — MORPHINE SULFATE 15 MG PO TABS
7.5000 mg | ORAL_TABLET | ORAL | 0 refills | Status: DC | PRN
Start: 2023-04-12 — End: 2023-08-27

## 2023-04-12 MED ORDER — SODIUM CHLORIDE 0.9 % IV SOLN: INTRAVENOUS | Status: DC

## 2023-04-12 MED ORDER — ONDANSETRON HCL 4 MG/2ML IJ SOLN
4.0000 mg | Freq: Once | INTRAMUSCULAR | Status: AC
  Administered 2023-04-12: 4 mg via INTRAVENOUS
  Filled 2023-04-12: qty 2

## 2023-04-12 MED ORDER — MORPHINE SULFATE (PF) 4 MG/ML IV SOLN
4.0000 mg | Freq: Once | INTRAVENOUS | Status: AC
  Administered 2023-04-12: 4 mg via INTRAVENOUS
  Filled 2023-04-12: qty 1

## 2023-04-12 MED ORDER — SENNOSIDES-DOCUSATE SODIUM 8.6-50 MG PO TABS
2.0000 | ORAL_TABLET | Freq: Every day | ORAL | 0 refills | Status: AC
Start: 2023-04-12 — End: 2023-04-22

## 2023-04-12 NOTE — Unmapped (Signed)
[facility]   Patient Onboarding/Medication Counseling    Sergio Perez is a 62 y.o. male with Peritoneal carcinomatosis who I am counseling today on initiation of therapy.  I am speaking to the patient's family member, sister .    Was a Nurse, learning disability used for this call? No    Verified patient's date of birth / HIPAA.    Specialty medication(s) to be sent: Hematology/Oncology: Lonsurf      Non-specialty medications/supplies to be sent: n/a      Medications not needed at this time: n/a         Lonsurf (Trifluridine and Tipiracil)    Medication & Administration     Dosage:   Take 5-15mg -6.14 two times a day with meals    Administration:   Take within 1 hour after morning and evening meals at the same time each day  Swallow whole. Do not crush, chew, or break tablets    Adherence/Missed dose instructions:  Skip the missed dose and go back to your normal time.  Do not take 2 doses at the same time or extra doses.    Goals of Therapy   To prevent disease progression    Side Effects & Monitoring Parameters   Common side effects:  Feeling tired or weak; fatigued  Nausea, Loss of appetite, diarrhea, vomiting, stomach pain  Mouth irritation or mouth sores  Hair loss  Change in taste    The following side effects should be reported to the provider:  Allergic reaction (rash; hives; itching; wheezing; tightness in throat; trouble breathing; or swelling of the mouth, face, lips, tongue, or throat)  Signs of infection (fever, chills, very bad sore throat, ear or sinus pain, cough, or wound that will not heal)  Unexplained bleeding or bruising (throwing up or coughing up blood; vomit that looks like coffee grounds; blood in the urine; black, red, or tarry stools; bleeding from the gums; abnormal vaginal bleeding; bruises without a cause or that get bigger; or bleeding you cannot stop)    Monitoring Parameters:  CBC before each cycle and on day 15 of each cycle  Pregnancy test prior to treatment    Contraindications, Warnings, & Precautions   Females of reproductive potential should use effective contraception during therapy and for at least 6 months after the last dose.   Males who have male partners of reproductive potential should use condoms during therapy and for at least 3 months following the last dose.    Drug/Food Interactions     Medication list reviewed in Epic. The patient was instructed to inform the care team before taking any new medications or supplements. No drug interactions identified.   Speak with your doctor prior to receiving any vaccinations (live or inactivated)    Storage, Handling Precautions, & Disposal   Store in the original container at room temperature in a dry place.  If stored outside of the original container, throw away any part not used after 30 days  Keep out of the reach of children and pets.  Do not throw away or flush unused medication down the toilet or sink. Return unused medication to [facility] or to a local take-back program for proper disposal.      Current Medications (including OTC/herbals), Comorbidities and Allergies     Current Outpatient Medications   Medication Sig Dispense Refill    acetaminophen (TYLENOL) 500 MG tablet Take 2 tablets (1,000 mg total) by mouth every twelve (12) hours as needed for pain.  amitriptyline (ELAVIL) 50 MG tablet Take 1 tablet (50 mg total) by mouth. Frequency:QHSPRN   Dosage:50   MG  Instructions:  Note:Dose: 50MG       amLODIPine (NORVASC) 10 MG tablet       atorvastatin (LIPITOR) 40 MG tablet Take 1 tablet (40 mg total) by mouth daily.      clindamycin 1 % gel Apply topically two (2) times a day. Apply a thin film to affected area at the first sign of rash. 60 g prn    clopidogreL (PLAVIX) 75 mg tablet clopidogrel 75 mg tablet   TAKE 1 TABLET BY MOUTH ONCE DAILY      diphenoxylate-atropine (LOMOTIL) 2.5-0.025 mg per tablet Take 1 tablet by mouth 4 times daily 30 tablet 0    doxycycline (MONODOX) 100 MG capsule Take 1 capsule (100 mg total) by mouth two (2) times a day. 60 capsule 5    fenofibrate (LOFIBRA) 160 MG tablet Take 1 tablet (160 mg total) by mouth daily.      fluoride, sodium, (DENTAGEL) 1.1 % Gel Apply 1 application topically two (2) times a day. 56 g 7    gabapentin (NEURONTIN) 400 MG capsule 1 in the AM; 1 at lunch and 3 at bedtime      levothyroxine (SYNTHROID, LEVOTHROID) 150 MCG tablet Take 1 tablet (150 mcg total) by mouth daily. 30 tablet 3    loperamide (IMODIUM A-D) 2 mg tablet Take 2 tablets (4 mg) by mouth for first loose stool followed by 1 tablet (2 mg) after each loose stool thereafter (maximum of 8 tablets per day). 30 tablet 11    magnesium oxide (MAG-OX) 400 mg (241.3 mg elemental magnesium) tablet Take 1 tablet (400 mg total) by mouth two (2) times a day. 60 tablet 11    metoprolol tartrate (LOPRESSOR) 100 MG tablet 1 tablet (100 mg total) two (2) times a day.      MITIGARE 0.6 mg cap capsule       ondansetron (ZOFRAN) 8 MG tablet Take 1 tablet (8 mg total) by mouth every eight (8) hours as needed for nausea. Do not take on day 1 of chemotherapy cycles. 60 tablet 3    oxyCODONE (ROXICODONE) 5 MG immediate release tablet Take 1 tablet (5 mg total) by mouth every six (6) hours as needed for pain. 90 tablet 0    pantoprazole (PROTONIX) 40 MG tablet Take 1 tablet (40 mg total) by mouth daily.      prochlorperazine (COMPAZINE) 10 MG tablet Take 1 tablet (10 mg total) by mouth every six (6) hours as needed (Nausea & Vomiting). 60 tablet 3    trifluridine-tipiracil (LONSURF) 15-6.14 mg tablet Take 5 tablets (75 mg total) by mouth in the morning and 5 tablets (75 mg total) in the evening. Take with meals. Take for 5 days on, 9 days off for every 14 day cycle. 100 tablet 6     No current facility-administered medications for this visit.       Allergies   Allergen Reactions    Hydrocodone Hives       Patient Active Problem List   Diagnosis    Squamous cell carcinoma of base of tongue (CMS-HCC) Hypothyroidism    Alcohol abuse    Anxiety state    Chondrocalcinosis due to dicalcium phosphate crystals    Pseudogout    Chest pain    Adjustment disorder with depressed mood    Herpes simplex virus (HSV) infection    Essential hypertension  Chronic insomnia    Knee injury    Olecranon bursitis    Shoulder joint pain    Infection of skin    Personal history of tobacco use, presenting hazards to health    Cancer of appendix (CMS-HCC)    Peritoneal carcinomatosis (CMS-HCC)    Carotid artery disease, unspecified laterality (CMS-HCC)    Advanced directives, counseling/discussion    Alcohol use    BPH (benign prostatic hyperplasia)    Central retinal artery occlusion of right eye    Closed fracture of distal end of radius    Diastolic dysfunction    GERD (gastroesophageal reflux disease)    Hearing loss secondary to cerumen impaction, right    Herpes labialis    History of cerebellar stroke    History of oral cancer    Hypertriglyceridemia    Immunocompromised (CMS-HCC)    Impacted cerumen, right ear    Internal carotid artery stenosis    Leg pain    Synovial cyst of lumbar facet joint    Penile ulcer    Peripheral neuropathy    Primary osteoarthritis of right knee    S/P total knee arthroplasty    Scalp lesion    Hypokalemia       Reviewed and up to date in Epic.    Appropriateness of Therapy     Acute infections noted within Epic:  No active infections  Patient reported infection: None    Is the medication and dose appropriate based on diagnosis, medication list, comorbidities, allergies, medical history, patient???s ability to self-administer the medication, and therapeutic goals? Per Dr Greig Right on the [facility] this is an off-label modification of the usual q28day dosing that we've found is better tolerated in some patients. It keeps the monthly dose intensity the same and ten total days of treatment, but there's not a direct reference for it that I am aware of.       Prescription has been clinically reviewed: Yes      Baseline Quality of Life Assessment      How many days over the past month did your condition  keep you from your normal activities? For example, brushing your teeth or getting up in the morning. 0    Financial Information     Medication Assistance provided: Prior Authorization    Anticipated copay of $0 reviewed with patient. Verified delivery address.    Delivery Information     Scheduled delivery date: 04/14/23    Expected start date: ASAP      Medication will be delivered via Same Day Courier to the temporary address in [place].  This shipment will not require a signature.      Explained the services we provide at [facility] and that each month we would call to set up refills.  Stressed importance of returning phone calls so that we could ensure they receive their medications in time each month.  Informed patient that we should be setting up refills 7-10 days prior to when they will run out of medication.  A pharmacist will reach out to perform a clinical assessment periodically.  Informed patient that a welcome packet, containing information about our pharmacy and other support services, a Notice of Privacy Practices, and a drug information handout will be sent.      The patient or caregiver noted above participated in the development of this care plan and knows that they can request review of or adjustments to the care plan at any time.  Patient or caregiver verbalized understanding of the above information as well as how to contact the pharmacy at [phone] option 4 with any questions/concerns.  The pharmacy is open Monday through Friday 8:30am-4:30pm.  A pharmacist is available 24/7 via pager to answer any clinical questions they may have.    Patient Specific Needs     Does the patient have any physical, cognitive, or cultural barriers? No    Does the patient have adequate living arrangements? (i.e. the ability to store and take their medication appropriately) Yes    Did you identify any home environmental safety or security hazards? No    Patient prefers to have medications discussed with  Family Member     Is the patient or caregiver able to read and understand education materials at a high school level or above? Yes    Patient's primary language is  English     Is the patient high risk? Yes, patient is taking oral chemotherapy. Appropriateness of therapy as been assessed    SOCIAL DETERMINANTS OF HEALTH     At the [facility], we have learned that life circumstances - like trouble affording food, housing, utilities, or transportation can affect the health of many of our patients.   That is why we wanted to ask: are you currently experiencing any life circumstances that are negatively impacting your health and/or quality of life? No    Social Determinants of Health     Financial Resource Strain: Low Risk  (12/15/2021)    Overall Financial Resource Strain (CARDIA)     Difficulty of Paying Living Expenses: Not hard at all   Internet Connectivity: Internet connectivity concern identified (12/15/2021)    Internet Connectivity     Do you have access to internet services: No     How do you connect to the internet: Personal Device at home     Is your internet connection strong enough for you to watch video on your device without major problems?: Not on file     Do you have enough data to get through the month?: Not on file     Does at least one of the devices have a camera that you can use for video chat?: Not on file   Food Insecurity: No Food Insecurity (10/06/2022)    Received from [facility], [facility]    Hunger Vital Sign     Worried About Running Out of Food in the Last Year: Never true     Ran Out of Food in the Last Year: Never true   Tobacco Use: Medium Risk (03/18/2023)    Patient History     Smoking Tobacco Use: Former     Smokeless Tobacco Use: Never     Passive Exposure: Not on file   Housing/Utilities: Low Risk  (12/15/2021)    Housing/Utilities     Within the past 12 months, have you ever stayed: outside, in a car, in a tent, in an overnight shelter, or temporarily in someone else's home (i.e. couch-surfing)?: No     Are you worried about losing your housing?: No     Within the past 12 months, have you been unable to get utilities (heat, electricity) when it was really needed?: No   Alcohol Use: Not on file   Transportation Needs: No Transportation Needs (10/06/2022)    Received from [facility], [facility]    [facility] - Transportation     Lack of Transportation (Medical): No     Lack of Transportation (  Non-Medical): No   Substance Use: Not on file   Health Literacy: Low Risk  (12/15/2021)    Health Literacy     : Never   Physical Activity: Not on file   Interpersonal Safety: Unknown (04/12/2023)    Interpersonal Safety     Unsafe Where You Currently Live: Not on file     Physically Hurt by Anyone: Not on file     Abused by Anyone: Not on file   Stress: Not on file   Intimate Partner Violence: Not At Risk (09/28/2022)    Received from [facility], [facility]    Humiliation, Afraid, Rape, and Kick questionnaire     Fear of Current or Ex-Partner: No     Emotionally Abused: No     Physically Abused: No     Sexually Abused: No   Depression: Not on file   Social Connections: Not on file       Would you be willing to receive help with any of the needs that you have identified today? Not applicable       Victoriana Aziz Vangie Bicker, PharmD  [facility] Specialty Pharmacist

## 2023-04-12 NOTE — Unmapped (Signed)
Upcoming Appt:  Future Appointments   Date Time Provider Department Center   04/15/2023  9:00 AM [facility]   04/15/2023  9:30 AM Sanoff, Mabeline Caras, MD [facility]         [phone]   Patient's sister is the caller.   Reason for call is gas/burping. Needs to know what to do.   Is this a pediatric patient?   No   Any recent, relevant visit?   Yes   Date/location of recent visit?  No recent visits     Any related medications?  Yes   Name of medication and dose?  Oxycodone doesn't know the dosage or when he has taken the medication    Any interventions?   Yes   What has been done? (inlcude related medication given, dose, and date/time)  Maybe some Antacid medicine doesn't know dosage thinks he took last night   Reason for Disposition   [1] SEVERE pain (e.g., excruciating) AND [2] present > 1 hour    Answer Assessment - Initial Assessment Questions  1. LOCATION: Where does it hurt?       Below the belly button across the lower abdomen   2. RADIATION: Does the pain shoot anywhere else? (e.g., chest, back)      Denies radiation   3. ONSET: When did the pain begin? (Minutes, hours or days ago)       04/11/23 2 pm   4. SUDDEN: Gradual or sudden onset?      Sudden onset   5. PATTERN Does the pain come and go, or is it constant?     - If it comes and goes: How long does it last? Do you have pain now?      (Note: Comes and goes means the pain is intermittent. It goes away completely between bouts.)     - If constant: Is it getting better, staying the same, or getting worse?       (Note: Constant means the pain never goes away completely; most serious pain is constant and gets worse.)       Constant   6. SEVERITY: How bad is the pain?  (e.g., Scale 1-10; mild, moderate, or severe)     - MILD (1-3): Doesn't interfere with normal activities, abdomen soft and not tender to touch.      - MODERATE (4-7): Interferes with normal activities or awakens from sleep, abdomen tender to touch.      - SEVERE (8-10): Excruciating pain, doubled over, unable to do any normal activities.        13 out of 1-10   7. RECURRENT SYMPTOM: Have you ever had this type of stomach pain before? If Yes, ask: When was the last time? and What happened that time?       Different pain   8. CAUSE: What do you think is causing the stomach pain?      Thinks it is his cancer   9. RELIEVING/AGGRAVATING FACTORS: What makes it better or worse? (e.g., antacids, bending or twisting motion, bowel movement)      Not really   10. OTHER SYMPTOMS: Do you have any other symptoms? (e.g., back pain, diarrhea, fever, urination pain, vomiting)      Denies back pain, denies diarrhea, he reports fever but doesn't know the degree, problems starting his flow of urination. Last urinated last night at 6 pm on 04/11/23. He is vomiting. He has vomiting 2 to 3 times each hour.  Protocols used: Abdominal Pain - Male-A-AH

## 2023-04-12 NOTE — Discharge Instructions (Signed)
As discussed, with your cancer it is important that you contact your oncology team today and discuss today's CT scan, your new pain medication regimen and arrange appropriate ongoing care.  Return here for concerning changes in your condition.

## 2023-04-12 NOTE — ED Triage Notes (Signed)
Patient BIB EMS from home.   Hx of stomach cancer  1 year and 2 months. Takes a new med every 10 days. Due to insurance he has not taken med for 14 days.   He has chronic abdominal pain. But last night he noted an increase in pain that feels like a knife. Pain radiates to groin.   No constipation Abdominal distension noted Vomiting last night due to the pain. Every 2 hours. Now dry heaving   124/90 120 HR 18 RR 99% RA 133 CBG

## 2023-04-12 NOTE — ED Provider Notes (Signed)
[place] EMERGENCY DEPARTMENT AT [facility] Provider Note   CSN: [id number] Arrival date & time: 04/12/23  7893     History  Chief Complaint  Patient presents with   Abdominal Pain    Patrick Bailey. is a 62 y.o. male.  HPI With history of peritoneal carcinomatosis presents with abdominal pain. Patient receives care at [facility].  Has had some degree of pain, but over the past 2 days pain has become worse, throughout the lower mid abdomen. There is associated anorexia, p.o. intolerance.  He spoke with his oncology team and was sent here for evaluation.    Home Medications Prior to Admission medications   Medication Sig Start Date End Date Taking? Authorizing Provider  morphine (MSIR) 15 MG tablet Take 0.5 tablets (7.5 mg total) by mouth every 4 (four) hours as needed for severe pain. 04/12/23  Yes Gerhard Munch, MD  senna-docusate (SENOKOT-S) 8.6-50 MG tablet Take 2 tablets by mouth daily for 10 days. 04/12/23 04/22/23 Yes Gerhard Munch, MD  amitriptyline (ELAVIL) 100 MG tablet Take 1 tablet (100 mg total) by mouth at bedtime. 10/13/22   Eustaquio Boyden, MD  amLODipine (NORVASC) 10 MG tablet Take 1 tablet by mouth once daily 02/18/23   Eustaquio Boyden, MD  aspirin EC 81 MG tablet Take 1 tablet (81 mg total) by mouth daily. Swallow whole. 07/03/22   Pennie Banter, DO  atorvastatin (LIPITOR) 40 MG tablet Take 1 tablet (40 mg total) by mouth daily. 10/13/22   Eustaquio Boyden, MD  azithromycin (ZITHROMAX Z-PAK) 250 MG tablet Take 2 tablets (500 mg) on  Day 1,  followed by 1 tablet (250 mg) once daily on Days 2 through 5. 03/17/23   Karie Schwalbe, MD  clopidogrel (PLAVIX) 75 MG tablet Take 1 tablet by mouth once daily Patient taking differently: Take 75 mg by mouth daily. 05/08/22   Eustaquio Boyden, MD  DENTAGEL 1.1 % GEL dental gel PLACE 1 APPLICATION ONTO TEETH AT BEDTIME. Patient taking differently: Place 1 Application onto teeth daily. 02/20/22   Eustaquio Boyden, MD  fenofibrate 160 MG tablet Take 1 tablet by mouth once daily Patient taking differently: Take 160 mg by mouth daily. 03/31/22   Eustaquio Boyden, MD  gabapentin (NEURONTIN) 400 MG capsule Take 1 capsule (400 mg total) by mouth 2 (two) times daily AND 3 capsules (1,200 mg total) at bedtime. TAKE 1 CAPSULE BY MOUTH TWICE DAILY AND 3 AT BEDTIME Strength: 400 mg. 10/13/22   Eustaquio Boyden, MD  levothyroxine (SYNTHROID) 150 MCG tablet Take 1 tablet (150 mcg total) by mouth daily. 10/13/22   Eustaquio Boyden, MD  metoprolol tartrate (LOPRESSOR) 100 MG tablet Take 1 tablet (100 mg total) by mouth 2 (two) times daily. 10/13/22   Eustaquio Boyden, MD  MITIGARE 0.6 MG CAPS Take 0.6 mg by mouth daily as needed (gout flare). TAKE 1 CAPSULE BY MOUTH IN THE MORNING AS NEEDED Patient taking differently: Take 0.6 mg by mouth daily as needed (gout flare). 01/12/20   Darlin Priestly, MD  ondansetron (ZOFRAN) 4 MG tablet Take 1 tablet (4 mg total) by mouth daily as needed for nausea or vomiting. 10/05/22 10/05/23  Sherryll Burger, Pratik D, DO  pantoprazole (PROTONIX) 40 MG tablet Take 1 tablet (40 mg total) by mouth daily. 10/13/22   Eustaquio Boyden, MD  predniSONE (DELTASONE) 20 MG tablet Take 2 tablets (40 mg total) by mouth daily. 03/17/23   Karie Schwalbe, MD  valACYclovir (VALTREX) 1000 MG tablet Take 2 tablets (  2,000 mg total) by mouth in the morning and at bedtime. For 1 day (4 tablets per episode) 10/13/22   Eustaquio Boyden, MD      Allergies    Hydrocodone    Review of Systems   Review of Systems  All other systems reviewed and are negative.   Physical Exam Updated Vital Signs BP (!) 116/91   Pulse 94   Temp 98.3 F (36.8 C) (Oral)   Resp (!) 9   SpO2 100%  Physical Exam Vitals and nursing note reviewed.  Constitutional:      Appearance: He is well-developed. He is ill-appearing.  HENT:     Head: Normocephalic and atraumatic.  Eyes:     Conjunctiva/sclera: Conjunctivae normal.   Cardiovascular:     Rate and Rhythm: Regular rhythm. Tachycardia present.  Pulmonary:     Effort: Pulmonary effort is normal. No respiratory distress.     Breath sounds: No stridor.  Abdominal:     General: There is no distension.     Tenderness: There is generalized abdominal tenderness. There is guarding.  Skin:    General: Skin is warm and dry.  Neurological:     Mental Status: He is alert and oriented to person, place, and time.     ED Results / Procedures / Treatments   Labs (all labs ordered are listed, but only abnormal results are displayed) Labs Reviewed  COMPREHENSIVE METABOLIC PANEL - Abnormal; Notable for the following components:      Result Value   Sodium 133 (*)    Chloride 95 (*)    Glucose, Bld 122 (*)    Albumin 3.4 (*)    All other components within normal limits  CBC WITH DIFFERENTIAL/PLATELET - Abnormal; Notable for the following components:   Platelets 531 (*)    All other components within normal limits  LIPASE, BLOOD  I-STAT CG4 LACTIC ACID, ED    EKG None  Radiology CT ABDOMEN PELVIS W CONTRAST  Result Date: 04/12/2023 CLINICAL DATA:  Abdominal pain. Carcinomatosis. Colon cancer. * Tracking Code: BO * EXAM: CT ABDOMEN AND PELVIS WITH CONTRAST TECHNIQUE: Multidetector CT imaging of the abdomen and pelvis was performed using the standard protocol following bolus administration of intravenous contrast. RADIATION DOSE REDUCTION: This exam was performed according to the departmental dose-optimization program which includes automated exposure control, adjustment of the mA and/or kV according to patient size and/or use of iterative reconstruction technique. CONTRAST:  75mL OMNIPAQUE IOHEXOL 350 MG/ML SOLN COMPARISON:  09/28/2022 FINDINGS: Lower chest: Dependent atelectasis. Hepatobiliary: 2.6 cm heterogeneously enhancing lesion posterior right liver was not visible on previous exam performed without intravenous contrast material. This lesion was 1.8 cm on  postcontrast abdomen CT of 11/14/2021. There is no evidence for gallstones, gallbladder wall thickening, or pericholecystic fluid. No intrahepatic or extrahepatic biliary dilation. Pancreas: No focal mass lesion. No dilatation of the main duct. No intraparenchymal cyst. No peripancreatic edema. Spleen: No splenomegaly. No suspicious focal mass lesion. Adrenals/Urinary Tract: No adrenal nodule or mass. Kidneys unremarkable. No evidence for hydroureter. The urinary bladder appears normal for the degree of distention. Stomach/Bowel: Stomach is unremarkable. No gastric wall thickening. No evidence of outlet obstruction. Duodenum is normally positioned as is the ligament of Treitz. Small bowel loops in the abdomen are diffusely fluid-filled and distended although to a lesser degree than on the 09/28/2022 exam. Index small bowel loop in the left abdomen measures 4 cm diameter on image 60/3. No abrupt or discrete transition zone is evident although small bowel  loops in the right abdomen are largely decompressed. Patient is status post right hemicolectomy. Descending and sigmoid colon are decompressed. Vascular/Lymphatic: There is moderate atherosclerotic calcification of the abdominal aorta without aneurysm. There is no gastrohepatic or hepatoduodenal ligament lymphadenopathy. No retroperitoneal or mesenteric lymphadenopathy. No pelvic sidewall lymphadenopathy. Reproductive: The prostate gland and seminal vesicles are unremarkable. Other: Interval development of loculated fluid collection in the inferior tip of the right liver showing peripheral rim enhancement. This comma-shaped collection measures 13.2 x 3.8 cm on image 53/3. Small amount of free fluid is seen along the liver capsule. New fluid collection seen around the spleen tracking up into the left subdiaphragmatic space. No rim enhancement. In the subdiaphragmatic space, this collection measures 8.6 x 6.5 cm on image 9/series 3. There is fluid around the  decompressed stomach with trace fluid in the pericolic gutters bilaterally and scattered areas of interloop mesenteric fluid. Interval development of prominent omental nodularity with infiltrative soft tissue density (see images 45/3 through 56/3). Small volume fluid noted anterior pelvis. Musculoskeletal: No worrisome lytic or sclerotic osseous abnormality. IMPRESSION: 1. Interval development of prominent omental nodularity with infiltrative soft tissue density. Imaging features are compatible with peritoneal carcinomatosis/metastatic disease. 2. Interval development of loculated fluid collection in the inferior tip of the right liver showing peripheral rim enhancement. This measures 13.2 x 3.8 cm. Imaging features are compatible with carcinomatosis. 3. Interval development of fluid collection around the spleen tracking up into the left subdiaphragmatic space. No rim enhancement. Imaging features are nonspecific and could be related to peritoneal carcinomatosis or simple ascites. 4. Interval development of small volume ascites. 5. Interval enlargement of heterogeneously enhancing lesion posterior right liver,, nonspecific but metastatic disease remains a concern. 6. Diffusely fluid-filled and distended small bowel loops in the abdomen although to a lesser degree than on the 09/28/2022 exam. No abrupt or discrete transition zone is evident although small bowel loops in the right abdomen are largely decompressed. Imaging features are compatible with ileus. Small bowel obstruction cannot be entirely excluded. 7.  Aortic Atherosclerosis (ICD10-I70.0). Electronically Signed   By: Kennith Center M.D.   On: 04/12/2023 12:50    Procedures Procedures    Medications Ordered in ED Medications  0.9 %  sodium chloride infusion ( Intravenous New Bag/Given 04/12/23 1024)  fentaNYL (SUBLIMAZE) injection 50 mcg (50 mcg Intravenous Given 04/12/23 1024)  ondansetron (ZOFRAN) injection 4 mg (4 mg Intravenous Given 04/12/23 1023)   morphine (PF) 4 MG/ML injection 4 mg (4 mg Intravenous Given 04/12/23 1246)  iohexol (OMNIPAQUE) 350 MG/ML injection 75 mL (75 mLs Intravenous Contrast Given 04/12/23 1212)    ED Course/ Medical Decision Making/ A&P                                 Medical Decision Making Adult male with carcinomatosis presents with abdominal pain, nausea, vomiting.  On exam patient is awake and alert, but tachycardic, uncomfortable appearing.  Concern for progression of disease versus bowel obstruction or infection.  CT fluids fentanyl ordered. Cardiac 115 sinus tach abnormal Pulse ox 100% room air normal   Amount and/or Complexity of Data Reviewed Independent Historian: EMS External Data Reviewed: notes. Labs: ordered. Decision-making details documented in ED Course. Radiology: ordered and independent interpretation performed. Decision-making details documented in ED Course.  Risk Prescription drug management. Decision regarding hospitalization. Diagnosis or treatment significantly limited by social determinants of health.   1:44 PM Patient accompanied by sister and  father.  We discussed results after obtaining consent.  Labs generally noncontributory, CT consistent with worsening metastatic disease.  Patient's vital signs are unremarkable, he is feeling substantially better after change in his analgesic regimen. Suspicion for worsening pain secondary to progression of disease.  He has been having bowel movements, though loose stool.  Patient, family comfortable with following up with his cancer team as the patient is still pursuing treatment for this, rather than admission with pain management solely. Patient courage to return here for concerning changes should they occur prior to following up with his oncology team, discharged in stable condition.        Final Clinical Impression(s) / ED Diagnoses Final diagnoses:  Generalized abdominal pain  Carcinomatosis (HCC)    Rx / DC Orders ED  Discharge Orders          Ordered    morphine (MSIR) 15 MG tablet  Every 4 hours PRN        04/12/23 1343    senna-docusate (SENOKOT-S) 8.6-50 MG tablet  Daily        04/12/23 1343              Gerhard Munch, MD 04/12/23 1344

## 2023-04-12 NOTE — ED Notes (Signed)
Help get patient into a gown on the monitor did EKG shown to Dr Jeraldine Loots patient is resting with call bell in reach

## 2023-04-13 ENCOUNTER — Ambulatory Visit: Admit: 2023-04-13 | Discharge: 2023-04-14 | Payer: PRIVATE HEALTH INSURANCE

## 2023-04-13 DIAGNOSIS — C181 Malignant neoplasm of appendix: Principal | ICD-10-CM

## 2023-04-13 DIAGNOSIS — C786 Secondary malignant neoplasm of retroperitoneum and peritoneum: Secondary | ICD-10-CM | POA: Diagnosis not present

## 2023-04-13 NOTE — Unmapped (Signed)
RN Navigator contacted both pt and his sister, Jasmine December, separately to follow up on Mychart message.     -Recommended holding MSIR since not working and using oxy for pain.   - Spoke with Dr. Forbes Cellar and she does not feel that increasing antiacid with cause relief. Recommend low residue diet. Recommended small, frequent meals. And encouraged pt to avoid soda.   - Pt will plan to come to appointment on Thursday to review local scan and treatment plan.

## 2023-04-13 NOTE — Unmapped (Unsigned)
[place] [facility] MEDICAL ONCOLOGY     CONSULTING PROVIDERS  Dr. Thornton Papas, Medical Oncology, [facility]  Dr. 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 11/15/21: Right hemicolectomy: G3 poorly diff goblet cell adenocarcinoma with focal mucinous diff, 2.4cm through visceral peritoneum, 2/19 nodes, 5 tumor deposits, pT4aN1b.   [facility] NGS:   TP53 mutation; multiple VUS:  ATM, MTHFR, SH2B3, MSH2, SRP14, ATRX, LRP1B.   TMB 4.7, MSS  2. 12/31/21-06/2022 adjuvant capecitabine x 8 cycles (dose reductions and delay d/t grade 3 HFS)   - c1 complicated by HFS.   - c2 dose reduced to 2g BID, still with HFS but shorter and less severe  - c5 delayed, dose reduced to 1500mg  BID  3. 08/2022 recurrent appendiceal w/ peritoneal carcinomatosis  4. 08/20/22 -10/2022 FOLFOX (no bevacizumab given CVA 12/23). Best response PD.   -Cy 3 complicated by diarrhea, ileus, hospitalization. Non-contrasted scan without progression or frank obstruction (ileus)  - c4 5FU only during recovery  5. 10/2022- 5/ 2024 irinotecan 180 mg/m2 q14d. Best response PD.   6. 01/2023- 03/2023 panitumumab  __________________________________________________________________    ASSESSMENT  1. 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 Increasing cancer pain  3. Indeterminate left lower lung nodule - minimally change concern for slow growing malignancy.  Stable.   3. Acute left CVA 07/2022 with residual weakness, s/p PCI right carotid   4. Recent ileus, possibly related to progressing disease - no recurrent symptoms  5. ECOG PS 1    RECOMMENDATIONS  1.     2. Oxy 5 q6 prn [recommended 5qAM, 10qPM, 5 qhs for pain pattern] and acetominophen 650 bid.    3.        DISCUSSION      This was high risk medical decision making on the basis of advanced cancer and treatment with high risk for complication requiring ongoing careful monitoring for severe toxicity.     ______________________________________________________________________  HISTORY     Sergio Perez is seen today at the [facility] for ongoing management regarding appendiceal adenocarcinoma     - to ED 4 days ago for inadequately controlled pain  -        MEDICAL HISTORY  1. Alcohol Use  2. Cerebellar Stroke (2017), no residual deficits, Acute left CVA 07/2022   3. CAD  4. CPPD  5. GERD  6. HTN  7. Hypothyroidism (acquired following ENT cancer treatment)  8. 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   9. Bilateral total knee replacement  10. Right side sciatica     Allergies   Allergen Reactions    Hydrocodone Hives     Medications reviewed in the EMR    SOCIAL HISTORY  He lives alone in [place]. 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 He reports quitting alcohol 1 week prior to hospital admission April 2023. He previously drank a sixpack of alcohol per day. No drug use.  Sister lives in [place].  Father lives on same land in another house. 2024 got full longterm disability    PHYSICAL EXAM  There were no vitals taken for this visit.   GENERAL: well developed, well nourished, in no distress  HEENT: NCAT, pupils equal, sclerae anicteric, PSYCH: full  and appropriate range of affect with good insight and judgement  LYMPH: No cervical or supraclav adenopathy  LUNGS: clear bilaterally with good air excursion and no wheezing, rhonci, rales  COR: reg,  no murmurs, rubs, gallops   GI: abdomen soft, nontender, no ascites, normoactive BS, no hepatosplenomegaly or masses.   EXT: no edema  SKIN: No rashes     OBJECTIVE DATA  LABS  Labs reviewed in emr     RADIOLOGY RESULTS   CT scan from outside ED  Continued progression- extensive carcinomatosis  Dilation of bowel with decompression distally- no transition point  Multiple fluid collections

## 2023-04-14 ENCOUNTER — Encounter: Admit: 2023-04-14 | Payer: PRIVATE HEALTH INSURANCE

## 2023-04-14 ENCOUNTER — Ambulatory Visit: Admit: 2023-04-14 | Payer: PRIVATE HEALTH INSURANCE

## 2023-04-14 ENCOUNTER — Ambulatory Visit
Admit: 2023-04-14 | Discharge: 2023-05-01 | Disposition: A | Discharge status: Home / Self Care | Payer: PRIVATE HEALTH INSURANCE | Admitting: Surgical Oncology

## 2023-04-14 ENCOUNTER — Encounter
Admit: 2023-04-14 | Payer: PRIVATE HEALTH INSURANCE | Attending: Student in an Organized Health Care Education/Training Program

## 2023-04-14 DIAGNOSIS — Z79890 Hormone replacement therapy (postmenopausal): Secondary | ICD-10-CM | POA: Diagnosis not present

## 2023-04-14 DIAGNOSIS — E440 Moderate protein-calorie malnutrition: Secondary | ICD-10-CM | POA: Diagnosis not present

## 2023-04-14 DIAGNOSIS — Z9049 Acquired absence of other specified parts of digestive tract: Secondary | ICD-10-CM | POA: Diagnosis not present

## 2023-04-14 DIAGNOSIS — Z931 Gastrostomy status: Secondary | ICD-10-CM | POA: Diagnosis not present

## 2023-04-14 DIAGNOSIS — J984 Other disorders of lung: Secondary | ICD-10-CM | POA: Diagnosis not present

## 2023-04-14 DIAGNOSIS — K56609 Unspecified intestinal obstruction, unspecified as to partial versus complete obstruction: Secondary | ICD-10-CM | POA: Diagnosis not present

## 2023-04-14 DIAGNOSIS — R1084 Generalized abdominal pain: Secondary | ICD-10-CM | POA: Diagnosis not present

## 2023-04-14 DIAGNOSIS — Z8673 Personal history of transient ischemic attack (TIA), and cerebral infarction without residual deficits: Secondary | ICD-10-CM | POA: Diagnosis not present

## 2023-04-14 DIAGNOSIS — Z7902 Long term (current) use of antithrombotics/antiplatelets: Secondary | ICD-10-CM | POA: Diagnosis not present

## 2023-04-14 DIAGNOSIS — E785 Hyperlipidemia, unspecified: Secondary | ICD-10-CM | POA: Diagnosis not present

## 2023-04-14 DIAGNOSIS — K56699 Other intestinal obstruction unspecified as to partial versus complete obstruction: Secondary | ICD-10-CM | POA: Diagnosis not present

## 2023-04-14 DIAGNOSIS — R180 Malignant ascites: Secondary | ICD-10-CM | POA: Diagnosis not present

## 2023-04-14 DIAGNOSIS — Z87891 Personal history of nicotine dependence: Secondary | ICD-10-CM | POA: Diagnosis not present

## 2023-04-14 DIAGNOSIS — I10 Essential (primary) hypertension: Secondary | ICD-10-CM | POA: Diagnosis not present

## 2023-04-14 DIAGNOSIS — G893 Neoplasm related pain (acute) (chronic): Secondary | ICD-10-CM | POA: Diagnosis not present

## 2023-04-14 DIAGNOSIS — C786 Secondary malignant neoplasm of retroperitoneum and peritoneum: Secondary | ICD-10-CM | POA: Diagnosis not present

## 2023-04-14 DIAGNOSIS — T8141XA Infection following a procedure, superficial incisional surgical site, initial encounter: Secondary | ICD-10-CM | POA: Diagnosis not present

## 2023-04-14 DIAGNOSIS — L03311 Cellulitis of abdominal wall: Secondary | ICD-10-CM | POA: Diagnosis not present

## 2023-04-14 DIAGNOSIS — Z4682 Encounter for fitting and adjustment of non-vascular catheter: Secondary | ICD-10-CM | POA: Diagnosis not present

## 2023-04-14 DIAGNOSIS — C181 Malignant neoplasm of appendix: Secondary | ICD-10-CM | POA: Diagnosis not present

## 2023-04-14 DIAGNOSIS — G8918 Other acute postprocedural pain: Secondary | ICD-10-CM | POA: Diagnosis not present

## 2023-04-14 DIAGNOSIS — Z6829 Body mass index (BMI) 29.0-29.9, adult: Secondary | ICD-10-CM | POA: Diagnosis not present

## 2023-04-14 DIAGNOSIS — Z8509 Personal history of malignant neoplasm of other digestive organs: Secondary | ICD-10-CM | POA: Diagnosis not present

## 2023-04-14 DIAGNOSIS — E039 Hypothyroidism, unspecified: Secondary | ICD-10-CM | POA: Diagnosis not present

## 2023-04-14 LAB — COMPREHENSIVE METABOLIC PANEL
ALBUMIN: 4.1 g/dL (ref 3.4–5.0)
ALKALINE PHOSPHATASE: 83 U/L (ref 46–116)
ALT (SGPT): 14 U/L (ref 10–49)
ANION GAP: 11 mmol/L (ref 5–14)
AST (SGOT): 20 U/L (ref <=34)
BILIRUBIN TOTAL: 0.5 mg/dL (ref 0.3–1.2)
BLOOD UREA NITROGEN: 17 mg/dL (ref 9–23)
BUN / CREAT RATIO: 13
CALCIUM: 10.4 mg/dL (ref 8.7–10.4)
CHLORIDE: 99 mmol/L (ref 98–107)
CO2: 26 mmol/L (ref 20.0–31.0)
CREATININE: 1.27 mg/dL — ABNORMAL HIGH
EGFR CKD-EPI (2021) MALE: 64 mL/min/{1.73_m2} (ref >=60)
GLUCOSE RANDOM: 107 mg/dL (ref 70–179)
POTASSIUM: 4.2 mmol/L (ref 3.4–4.8)
PROTEIN TOTAL: 8.3 g/dL — ABNORMAL HIGH (ref 5.7–8.2)
SODIUM: 136 mmol/L (ref 135–145)

## 2023-04-14 LAB — CBC W/ AUTO DIFF
BASOPHILS ABSOLUTE COUNT: 0 10*9/L (ref 0.0–0.1)
BASOPHILS RELATIVE PERCENT: 0.2 %
EOSINOPHILS ABSOLUTE COUNT: 0 10*9/L (ref 0.0–0.5)
EOSINOPHILS RELATIVE PERCENT: 0.1 %
HEMATOCRIT: 45.6 % (ref 39.0–48.0)
HEMOGLOBIN: 15.7 g/dL (ref 12.9–16.5)
LYMPHOCYTES ABSOLUTE COUNT: 0.9 10*9/L — ABNORMAL LOW (ref 1.1–3.6)
LYMPHOCYTES RELATIVE PERCENT: 8.5 %
MEAN CORPUSCULAR HEMOGLOBIN CONC: 34.5 g/dL (ref 32.0–36.0)
MEAN CORPUSCULAR HEMOGLOBIN: 30.9 pg (ref 25.9–32.4)
MEAN CORPUSCULAR VOLUME: 89.5 fL (ref 77.6–95.7)
MEAN PLATELET VOLUME: 7 fL (ref 6.8–10.7)
MONOCYTES ABSOLUTE COUNT: 1.4 10*9/L — ABNORMAL HIGH (ref 0.3–0.8)
MONOCYTES RELATIVE PERCENT: 13.2 %
NEUTROPHILS ABSOLUTE COUNT: 8.1 10*9/L — ABNORMAL HIGH (ref 1.8–7.8)
NEUTROPHILS RELATIVE PERCENT: 78 %
PLATELET COUNT: 469 10*9/L — ABNORMAL HIGH (ref 150–450)
RED BLOOD CELL COUNT: 5.1 10*12/L (ref 4.26–5.60)
RED CELL DISTRIBUTION WIDTH: 13.5 % (ref 12.2–15.2)
WBC ADJUSTED: 10.4 10*9/L (ref 3.6–11.2)

## 2023-04-14 LAB — LIPASE: LIPASE: 26 U/L (ref 12–53)

## 2023-04-14 LAB — PREALBUMIN: PREALBUMIN: 7.4 mg/dL — ABNORMAL LOW (ref 10.0–40.0)

## 2023-04-14 MED ADMIN — lactated Ringers infusion: 125 mL/h | INTRAVENOUS | @ 22:00:00

## 2023-04-14 MED ADMIN — acetaminophen (OFIRMEV) 10 mg/mL injection 1,000 mg: 1000 mg | INTRAVENOUS | @ 23:00:00 | Stop: 2023-04-15

## 2023-04-14 MED ADMIN — lactated ringers bolus 1,000 mL: 1000 mL | INTRAVENOUS | @ 21:00:00 | Stop: 2023-04-14

## 2023-04-14 MED ADMIN — ondansetron (ZOFRAN) injection 4 mg: 4 mg | INTRAVENOUS | @ 22:00:00

## 2023-04-14 MED ADMIN — metoPROLOL (Lopressor) injection 5 mg: 5 mg | INTRAVENOUS | @ 22:00:00

## 2023-04-14 MED ADMIN — HYDROmorphone (PF) (DILAUDID) injection 1 mg: 1 mg | INTRAVENOUS | @ 22:00:00 | Stop: 2023-04-28

## 2023-04-14 NOTE — Unmapped (Addendum)
[facility]  Emergency Department Provider Note     HPI     Sergio Perez is a very pleasant 62 y.o. 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  Patient reports severe abdominal pain worse in the RLQ with nausea and vomiting last night.  He also notes that he is not able to keep anything down without vomiting.  Patient had an outpatient CT abdomen performed showing concerns for developing partial small bowel obstruction secondary to worsening peritoneal carcinomatosis and patient was subsequently referred to the ED by Dr. Pearlean Brownie.  Patient last vomited this morning as well as had 1 watery BM this morning.  Last meal was eggs at 9 AM.  Reports 10/10 pain.    Upon review of outside CT, impression is as follows:  Multiple dilated loops of small bowel, with somewhat gradual transition to decompressed small bowel in the right lower quadrant. Findings raise a concern for developing partial small bowel obstruction.      Peritoneum and omental carcinomatosis with increasing loculated appearing inferior perihepatic and perisplenic ascites.      2.5 cm enhancing lesion in segment 6 of the liver, stable in size.       MDM     Sergio Perez is a 62 y.o. 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    Vitals: BP 134/85  - Pulse 107  - Temp 36.2 ??C (97.2 ??F) (Oral)  - Resp 18  - Wt 91.2 kg (201 lb)  - SpO2 98%  - BMI 27.26 kg/m??      Pertinent findings on exam: Overall ill-appearing in no acute distress.  No murmurs.  Lungs are CTA bilaterally.  Abdomen is distended, firm, diffusely tender to palpation.    MDM: Patient presents with worsening abdominal pain with nausea and vomiting with outside hospital CT concerning for developing partial SBO in setting of possible worsening peritoneal carcinomatosis which is consistent with the patient's presentation today.  Will obtain basic laboratory workup and treat patient's pain, nausea as necessary.  I discussed the patient's case with surgery upon arrival who plans to admit the patient for medical management/possible surgical management of bowel obstruction.  Will proceed with NG tube placement here in the ED, 1L of LR.    Diagnostics:  Orders Placed This Encounter   Procedures    CBC w/ Differential    Comprehensive Metabolic Panel    Lipase Level    Urinalysis with Microscopy with Culture Reflex    Nasogastric tube insertion    Place Patient in Bed    ED Admit Decision            The case was discussed with the attending physician who is in agreement with the above assessment and plan.    - Any discussion of this patient's case/presentation between myself and consultants, admitting teams, or other team members has been documented above.  - Imaging and other studies, if performed, that were available during my care of the patient were independently reviewed and interpreted by me and considered in my medical decision making as documented above.  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 11/15/21: Right  hemicolectomy: G3 poorly diff goblet cell adenocarcinoma with focal mucinous diff, 2.4cm through visceral peritoneum, 2/19 nodes, 5 tumor deposits, pT4aN1b.   [facility] NGS:   TP53 mutation; multiple VUS:  ATM, MTHFR, SH2B3, MSH2, SRP14, ATRX, LRP1B.   TMB 4.7, MSS  2. 12/31/21-06/2022 adjuvant capecitabine x 8 cycles (dose reductions and delay d/t grade 3 HFS)   - c1 complicated by HFS.   - c2 dose reduced to 2g BID, still with HFS but shorter and less severe  - c5 delayed, dose reduced to 1500mg  BID  3. 08/2022 recurrent appendiceal w/ peritoneal carcinomatosis  4. 08/20/22 -10/2022 FOLFOX (no bevacizumab given CVA 12/23). Best response PD.   -Cy 3 complicated by diarrhea, ileus, hospitalization. Non-contrasted scan without progression or frank obstruction (ileus)  - c4 5FU only during recovery  5. 10/2022- 5/ 2024 irinotecan 180 mg/m2 q14d. Best response PD.   6. 01/2023- panitumumab     ED Clinical Impression     Final diagnoses:   Generalized abdominal pain (Primary)        Physical Exam     Vitals:    04/14/23 1408 04/14/23 1419   BP:  134/85   Pulse: 109 107   Resp:  18   Temp:  36.2 ??C (97.2 ??F)   TempSrc:  Oral   SpO2: 98%    Weight:  91.2 kg (201 lb)        Constitutional: Ill-appearing, NAD.   HEENT: Normocephalic and atraumatic. Conjunctivae are normal. EOMI.  Mucous membranes are dry.   Neck: Full active ROM  Cardiovascular: See heart rate listed above.  No murmurs appreciated.  Extremities are warm and well-perfused.  Respiratory: See respiratory rate listed above.  Lungs are CTA b/l.  Speaking easily in full sentences without audible stridor or wheezing.  Equal chest rise without increased work of breathing.  Gastrointestinal: Abdomen is distended, firm, diffusely tender to palpation  Musculoskeletal: No long bone deformities.  No lower extremity edema.   Neurologic: Normal speech and language. No gross focal neurologic deficits are appreciated.   Skin: Skin is warm, dry.  Psychiatric: Mood and affect are normal.     Past History     PAST MEDICAL HISTORY/PAST SURGICAL HISTORY:   Past Medical History:   Diagnosis Date    Hypothyroidism     Squamous cell carcinoma of base of tongue (CMS-HCC) 11/2008       Past Surgical History:   Procedure Laterality Date    chemoradiotherapy  2010    IR INSERT PORT AGE GREATER THAN 5 YRS  08/19/2022    IR INSERT PORT AGE GREATER THAN 5 YRS 08/19/2022 Evron, Sergio Lins, PA IMG [facility]IR [facility]    laser resection      NECK DISSECTION         MEDICATIONS:     Current Facility-Administered Medications:     morphine 4 mg/mL injection 4 mg, 4 mg, Intravenous, Q2H PRN, Corinna Lines, DO    Current Outpatient Medications:     acetaminophen (TYLENOL) 500 MG tablet, Take 2 tablets (1,000 mg total) by mouth every twelve (12) hours as needed for pain., Disp: , Rfl:     amitriptyline (ELAVIL) 50 MG tablet, Take 1 tablet (50 mg total) by mouth. Frequency:QHSPRN   Dosage:50   MG  Instructions:  Note:Dose: 50MG , Disp: , Rfl:     amLODIPine (NORVASC) 10 MG tablet, , Disp: , Rfl:     atorvastatin (LIPITOR) 40 MG tablet, Take 1 tablet (40 mg total) by  mouth daily., Disp: , Rfl:     clindamycin 1 % gel, Apply topically two (2) times a day. Apply a thin film to affected area at the first sign of rash., Disp: 60 g, Rfl: prn    clopidogreL (PLAVIX) 75 mg tablet, clopidogrel 75 mg tablet  TAKE 1 TABLET BY MOUTH ONCE DAILY, Disp: , Rfl:     diphenoxylate-atropine (LOMOTIL) 2.5-0.025 mg per tablet, Take 1 tablet by mouth 4 times daily, Disp: 30 tablet, Rfl: 0    doxycycline (MONODOX) 100 MG capsule, Take 1 capsule (100 mg total) by mouth two (2) times a day., Disp: 60 capsule, Rfl: 5    fenofibrate (LOFIBRA) 160 MG tablet, Take 1 tablet (160 mg total) by mouth daily., Disp: , Rfl:     fluoride, sodium, (DENTAGEL) 1.1 % Gel, Apply 1 application topically two (2) times a day., Disp: 56 g, Rfl: 7    gabapentin (NEURONTIN) 400 MG capsule, 1 in the AM; 1 at lunch and 3 at bedtime, Disp: , Rfl:     levothyroxine (SYNTHROID, LEVOTHROID) 150 MCG tablet, Take 1 tablet (150 mcg total) by mouth daily., Disp: 30 tablet, Rfl: 3    loperamide (IMODIUM A-D) 2 mg tablet, Take 2 tablets (4 mg) by mouth for first loose stool followed by 1 tablet (2 mg) after each loose stool thereafter (maximum of 8 tablets per day)., Disp: 30 tablet, Rfl: 11    magnesium oxide (MAG-OX) 400 mg (241.3 mg elemental magnesium) tablet, Take 1 tablet (400 mg total) by mouth two (2) times a day., Disp: 60 tablet, Rfl: 11    metoprolol tartrate (LOPRESSOR) 100 MG tablet, 1 tablet (100 mg total) two (2) times a day., Disp: , Rfl:     MITIGARE 0.6 mg cap capsule, , Disp: , Rfl:     ondansetron (ZOFRAN) 8 MG tablet, Take 1 tablet (8 mg total) by mouth every eight (8) hours as needed for nausea. Do not take on day 1 of chemotherapy cycles., Disp: 60 tablet, Rfl: 3    oxyCODONE (ROXICODONE) 5 MG immediate release tablet, Take 1 tablet (5 mg total) by mouth every six (6) hours as needed for pain., Disp: 90 tablet, Rfl: 0    pantoprazole (PROTONIX) 40 MG tablet, Take 1 tablet (40 mg total) by mouth daily., Disp: , Rfl:     prochlorperazine (COMPAZINE) 10 MG tablet, Take 1 tablet (10 mg total) by mouth every six (6) hours as needed (Nausea & Vomiting)., Disp: 60 tablet, Rfl: 3    trifluridine-tipiracil (LONSURF) 15-6.14 mg tablet, Take 5 tablets (75 mg total) by mouth in the morning and 5 tablets (75 mg total) in the evening. Take with meals. Take for 5 days on, 9 days off for every 14 day cycle., Disp: 100 tablet, Rfl: 6    ALLERGIES:   Hydrocodone    SOCIAL HISTORY:   Social History     Tobacco Use    Smoking status: Former     Current packs/day: 0.00     Average packs/day: 1 pack/day for 7.0 years (7.0 ttl pk-yrs)     Types: Cigarettes     Start date: 05/16/2003     Quit date: 05/15/2010     Years since quitting: 12.9    Smokeless tobacco: Never   Substance Use Topics    Alcohol use: Yes       FAMILY HISTORY:  Family History   Problem Relation Age of Onset    Cancer Brother  Brain         Radiology     No orders to display        Laboratory Data     Lab Results   Component Value Date    WBC 10.4 04/14/2023    HGB 15.7 04/14/2023    HCT 45.6 04/14/2023    PLT 469 (H) 04/14/2023       Lab Results   Component Value Date    NA 136 04/14/2023    K 4.2 04/14/2023    CL 99 04/14/2023    CO2 26.0 04/14/2023    BUN 17 04/14/2023    CREATININE 1.27 (H) 04/14/2023    GLU 107 04/14/2023    CALCIUM 10.4 04/14/2023    MG 1.5 (L) 03/18/2023       Lab Results   Component Value Date    BILITOT 0.5 04/14/2023    PROT 8.3 (H) 04/14/2023    ALBUMIN 4.1 04/14/2023    ALT 14 04/14/2023    AST 20 04/14/2023    ALKPHOS 83 04/14/2023       No results found for: LABPROT, INR, APTT    Kristopher Glee, DO  [facility]   Emergency Medicine, PGY-2    Portions of this record have been created using [employer]. Dictation errors have been sought, but may not have been identified and corrected.           Corinna Lines, DO  Resident  04/14/23 [phone]

## 2023-04-14 NOTE — Unmapped (Signed)
Per note in chart:        Got CT from OSH read here- Sergio Perez has developing obstruction from progressive carcinomatosis. Pt reports pain continues to be severe, entire abdomen but worst in RLQ. Last night he vomited a lot and got some temporary relief but it is back and very severe. Not keeping much of anythign down. Gets a bit of mild benefit from the oxycodone. I've reviewed scan with Dr. Pearlean Brownie: may be able to offer palliative bypass or ostomy.

## 2023-04-14 NOTE — Unmapped (Signed)
Got CT from OSH read here- Sergio Perez has developing obstruction from progressive carcinomatosis. Pt reports pain continues to be severe, entire abdomen but worst in RLQ. Last night he vomited a lot and got some temporary relief but it is back and very severe. Not keeping much of anythign down. Gets a bit of mild benefit from the oxycodone. I've reviewed scan with Dr. Pearlean Brownie: may be able to offer palliative bypass or ostomy. Discussed this with Sergio Perez (Sergio Perez) as below     Advance Care Planning     He has a cancer for which we have very limited systemic therapy options, and none with a meaningful chance of tumor reduction. As such anything we do surgically will very much be a temporizing measure. If Dr Pearlean Brownie is able to bypass [either internal or ostomy] the obstruction, there's a good chance this obstruction will happen again since not a single one of the systemic therapies I've given him has helped his cancer. So, options would be to bring him into hospital and attempt bypass or instead make transition to hospice. Given obstruction and severe pain this would likely be an inpatient hospice with ability for IV pain meds as palliation of his high symptom burden.     Sergio Perez would like to attempt palliative surgery, and understands that there's a real chance that Dr. Pearlean Brownie may not be able to bypass if his disease is too extensive. Also understands it would be a temporary fix. This is congruent with wishes he has expressed to me throughout his care that if there is anythign reasonable to try he would like to try it.     Health Care Decision Maker as of 04/14/2023    HCDM (patient stated preference): [place],TAM[facility] - Friend [phone]       PLAN:   -Sergio Perez will present to [facility] Emergency Department for evaluation once his sister comes to drive him  -Dr Pearlean Brownie will plan to admit to his service pending this eval, with tentative plan for OR to attempt palliative bypass from progressive carcinomatosis

## 2023-04-14 NOTE — Unmapped (Signed)
Here for obstruction r/t progressive carcinomatosis, per note Dr Pearlean Brownie will plan to admit to his service pending this eval, with tentative plan for OR to attempt palliative bypass from progressive carcinomatosis    Pt reports N/V/D, still passing gas.

## 2023-04-14 NOTE — Unmapped (Signed)
Surgical Oncology History & Physical  Note    Requesting Attending Physician:  No att. providers found  Service Requesting Consult:  Emergency Medicine  Service Providing Consult: Surgical Oncology  Consulting Attending: Dr. Pearlean Brownie    Assessment:  Sergio Perez is a 62 y.o. 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    Patient is afebrile, mildly tachycardic (105), normotensive, without leukocytosis, AKI (Cr 1.27 > 0.69), significantly distended, non-tender, non-peritonitic. CT Abd/Pelvis concerning for malignant SBO requiring admission for decompression, bowel rest, fluid resuscitation and potential palliative bypass.       PLAN:     - Admit to S[facility] 1, Floor Status   - IV tylenol, PRN IV HM   - Hx of CVA    - Hold home Plavix  - Hx of HTN, Hold home PO meds   - IV Metoprolol 5 mg Q6H   - NPO, strict  - Will place NG tube for decompression   - LR 125 ml/hr  - PRN Zofran, PRN Compazine   - SCDs, Lovenox for DVT Ppx   - Pre-albumin pending   - Floor status    If you have any questions, concerns or changes in the patient's clinical status, please feel free to contact SRA (1) Janete Quilling/[place]itzenberg/Gleeson [phone].     Emilio Aspen, MD  [facility] General Surgery, PGY5    Admitted for MBO 2' progressive appendiceal adenocarcinoma.  Plan for IVF hydration, bowel rest and NGT decompression.  Will check nutrition labs and will likely need TPN for severe protein calorie malnutrition.  If fails to respond to conservative measures, I fear he may require exploration for bypass/diversion.    History of Present Illness:   Chief Complaint:  Abdominal pain     Sergio Perez is a 62 y.o. male who is seen in consultation forSBO at the request of No att. providers found on the Emergency Medicine service.     Sergio Perez is 62 y.o. 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      Patient reports ongoing abdominal distention, nausea and multiple episodes of emesis for the past 4 days. Has been able to keep some water down. Passing small amount of flatus and liquid stool. Has increased home oxy to 4-5 tabs of 5mg  a day up from 1 tab daily. Low urine output, dark. Denies fevers.     One prior episode of SBO that was managed non-operatively at [facility] with NG decompression. Prior right hemicolectomy in 2023 no further abdominal surgeries.     History of stroke in 2023, last took Plavix this morning at 10 AM.     Past Medical History:   Past Medical History:   Diagnosis Date    Hypothyroidism     Squamous cell carcinoma of base of tongue (CMS-HCC) 11/2008  Past Surgical History:  Past Surgical History:   Procedure Laterality Date    chemoradiotherapy  2010    IR INSERT PORT AGE GREATER THAN 5 YRS  08/19/2022    IR INSERT PORT AGE GREATER THAN 5 YRS 08/19/2022 Evron, Gerri Lins, PA IMG VIR [facility]    laser resection      NECK DISSECTION         Medications:  No current facility-administered medications on file prior to encounter.     Current Outpatient Medications on File Prior to Encounter   Medication Sig Dispense Refill    acetaminophen (TYLENOL) 500 MG tablet Take 2 tablets (1,000 mg total) by mouth every twelve (12) hours as needed for pain.      amitriptyline (ELAVIL) 50 MG tablet Take 1 tablet (50 mg total) by mouth. Frequency:QHSPRN   Dosage:50   MG  Instructions:  Note:Dose: 50MG       amLODIPine (NORVASC) 10 MG tablet       atorvastatin (LIPITOR) 40 MG tablet Take 1 tablet (40 mg total) by mouth daily.      clindamycin 1 % gel Apply topically two (2) times a day. Apply a thin film to affected area at the first sign of rash. 60 g prn    clopidogreL (PLAVIX) 75 mg tablet clopidogrel 75 mg tablet   TAKE 1 TABLET BY MOUTH ONCE DAILY      diphenoxylate-atropine (LOMOTIL) 2.5-0.025 mg per tablet Take 1 tablet by mouth 4 times daily 30 tablet 0    doxycycline (MONODOX) 100 MG capsule Take 1 capsule (100 mg total) by mouth two (2) times a day. 60 capsule 5    fenofibrate (LOFIBRA) 160 MG tablet Take 1 tablet (160 mg total) by mouth daily.      fluoride, sodium, (DENTAGEL) 1.1 % Gel Apply 1 application topically two (2) times a day. 56 g 7    gabapentin (NEURONTIN) 400 MG capsule 1 in the AM; 1 at lunch and 3 at bedtime      levothyroxine (SYNTHROID, LEVOTHROID) 150 MCG tablet Take 1 tablet (150 mcg total) by mouth daily. 30 tablet 3    loperamide (IMODIUM A-D) 2 mg tablet Take 2 tablets (4 mg) by mouth for first loose stool followed by 1 tablet (2 mg) after each loose stool thereafter (maximum of 8 tablets per day). 30 tablet 11    magnesium oxide (MAG-OX) 400 mg (241.3 mg elemental magnesium) tablet Take 1 tablet (400 mg total) by mouth two (2) times a day. 60 tablet 11    metoprolol tartrate (LOPRESSOR) 100 MG tablet 1 tablet (100 mg total) two (2) times a day.      MITIGARE 0.6 mg cap capsule       ondansetron (ZOFRAN) 8 MG tablet Take 1 tablet (8 mg total) by mouth every eight (8) hours as needed for nausea. Do not take on day 1 of chemotherapy cycles. 60 tablet 3    oxyCODONE (ROXICODONE) 5 MG immediate release tablet Take 1 tablet (5 mg total) by mouth every six (6) hours as needed for pain. 90 tablet 0    pantoprazole (PROTONIX) 40 MG tablet Take 1 tablet (40 mg total) by mouth daily.      prochlorperazine (COMPAZINE) 10 MG tablet Take 1 tablet (10 mg total) by mouth every six (6) hours as needed (Nausea & Vomiting). 60 tablet 3    trifluridine-tipiracil (LONSURF) 15-6.14 mg tablet Take 5 tablets (75 mg total) by mouth in the morning and 5 tablets (75  mg total) in the evening. Take with meals. Take for 5 days on, 9 days off for every 14 day cycle. 100 tablet 6       Allergies:  Allergies   Allergen Reactions    Hydrocodone Hives       Family History:  Family History   Problem Relation Age of Onset    Cancer Brother         Brain       Social History:   Social History     Tobacco Use    Smoking status: Former     Current packs/day: 0.00     Average packs/day: 1 pack/day for 7.0 years (7.0 ttl pk-yrs)     Types: Cigarettes     Start date: 05/16/2003     Quit date: 05/15/2010     Years since quitting: 12.9    Smokeless tobacco: Never   Vaping Use    Vaping status: Never Used   Substance Use Topics    Alcohol use: Yes    Drug use: Yes     Types: Marijuana       Review of Systems  10 systems were reviewed and are negative except as noted specifically in the HPI.    Objective  Vitals:   Temp:  [36.2 ??C (97.2 ??F)] 36.2 ??C (97.2 ??F)  Heart Rate:  [107-109] 107  Resp:  [18] 18  BP: (134)/(85) 134/85  MAP (mmHg):  [98] 98  SpO2:  [98 %] 98 %      Intake/Output last 24 hours:  No intake or output data in the 24 hours ending 04/14/23 1546    Physical Exam:    General: Older male, resting comfortably in no acute distress  Eyes: Sclera anicteric, EOM intact  ENT: Nares without discharge, dry mucous membranes, trachea midline   Cardiac: Mild tachycardia (105), regular rhythm  Pulmonary: Non labored breathing, stable on room air   Abdomen: Firm, significantly distended and tympanic, non-tender, non-peritonitic   Extremities: Warm and well perfused, no edema bilaterally   Neuro: Alert and oriented x3    Pertinent Diagnostic Tests:  All lab results last 24 hours:    Recent Results (from the past 24 hour(s))   Comprehensive Metabolic Panel    Collection Time: 04/14/23  2:29 PM   Result Value Ref Range    Sodium 136 135 - 145 mmol/L    Potassium 4.2 3.4 - 4.8 mmol/L    Chloride 99 98 - 107 mmol/L    CO2 26.0 20.0 - 31.0 mmol/L    Anion Gap 11 5 - 14 mmol/L    BUN 17 9 - 23 mg/dL    Creatinine 9.56 (H) 0.73 - 1.18 mg/dL    BUN/Creatinine Ratio 13     eGFR CKD-EPI (2021) Male 63 >=60 mL/min/1.76m2    Glucose 107 70 - 179 mg/dL    Calcium 21.3 8.7 - 08.6 mg/dL    Albumin 4.1 3.4 - 5.0 g/dL    Total Protein 8.3 (H) 5.7 - 8.2 g/dL    Total Bilirubin 0.5 0.3 - 1.2 mg/dL    AST 20 <=57 U/L    ALT 14 10 - 49 U/L    Alkaline Phosphatase 83 46 - 116 U/L   Lipase Level    Collection Time: 04/14/23  2:29 PM   Result Value Ref Range    Lipase 26 12 - 53 U/L   CBC w/ Differential    Collection Time: 04/14/23  2:29 PM   Result  Value Ref Range    WBC 10.4 3.6 - 11.2 10*9/L    RBC 5.10 4.26 - 5.60 10*12/L    HGB 15.7 12.9 - 16.5 g/dL    HCT 02.7 25.3 - 66.4 %    MCV 89.5 77.6 - 95.7 fL    MCH 30.9 25.9 - 32.4 pg    MCHC 34.5 32.0 - 36.0 g/dL    RDW 40.3 47.4 - 25.9 %    MPV 7.0 6.8 - 10.7 fL    Platelet 469 (H) 150 - 450 10*9/L    Neutrophils % 78.0 %    Lymphocytes % 8.5 %    Monocytes % 13.2 %    Eosinophils % 0.1 %    Basophils % 0.2 %    Absolute Neutrophils 8.1 (H) 1.8 - 7.8 10*9/L    Absolute Lymphocytes 0.9 (L) 1.1 - 3.6 10*9/L    Absolute Monocytes 1.4 (H) 0.3 - 0.8 10*9/L    Absolute Eosinophils 0.0 0.0 - 0.5 10*9/L    Absolute Basophils 0.0 0.0 - 0.1 10*9/L       Imaging:  CT BODY INTERPRETATION OF OUTSIDE FILM [facility])    Result Date: 04/13/2023  EXAM: CT BODY INTERPRETATION OF OUTSIDE FILM [facility]) ACCESSION: [id number] UN CLINICAL INDICATION: C18.1 - Cancer of appendix (CMS - HCC)  COMPARISON: CT abdomen pelvis 03/16/2023 TECHNIQUE: A request was received from [facility] KELLY SANOFF to interpret a CT of the abdomen and pelvis performed at [facility] health on 04/12/2023. Images consist of contrast-enhanced axial images. Images were loaded into the [facility] PACS. FINDINGS: LINES AND TUBES: None. LOWER THORAX: Trace bibasilar atelectasis. HEPATOBILIARY: Ill-defined, enhancing lesion in segment 6 measuring 2.5 cm, stable in size (3:32). Diffuse hepatic steatosis. The gallbladder is normal in appearance.  SPLEEN: Normal in size and contour. Perisplenic fluid described below. PANCREAS: Atrophic pancreas. No focal lesions. No ductal dilation. ADRENALS: Unremarkable. KIDNEYS/URETERS: Unremarkable. BLADDER: Unremarkable. PELVIC ORGANS: Unremarkable. GI TRACT: Dilation of small bowel from the jejunum through the ileum measuring up to 4.2 cm. Somewhat gradual transition to decompressed bowel in the right lower quadrant ileum in the region of a fluid collection described below (6:62). Distal bowel is decompressed. Sequela of partial colectomy with anastomosis in the right lower quadrant. The appendix is surgically absent. PERITONEUM/RETROPERITONEUM AND MESENTERY: Diffuse peritoneal and omental nodularity, most notable anterior to the transverse colon. Increasing loculated appearing ascites along the right inferior hepatic lobe and perisplenic space. LYMPH NODES: No enlarged lymph nodes. VESSELS: Normal in caliber. Atherosclerotic calcifications of the aorta and its branches. BONES AND SOFT TISSUES: No aggressive osseous lesions. Postsurgical change of the anterior abdominal wall. Small bilateral fat-containing inguinal hernias.     Multiple dilated loops of small bowel, with somewhat gradual transition to decompressed small bowel in the right lower quadrant. Findings raise a concern for developing partial small bowel obstruction. Peritoneum and omental carcinomatosis with increasing loculated appearing inferior perihepatic and perisplenic ascites. 2.5 cm enhancing lesion in segment 6 of the liver, stable in size. PLEASE NOTE:  Our interpretation of studies performed at an outside institution is limited by factors including the absence of technical specifics of the image, undisclosed clinical information and the unavailability of the original interpretation.  Specialists at the institution that performed the study may have access to information not available to [place] that could make a difference in this interpretation.  We suggest that you obtain the original interpretation from the site where the study was performed.

## 2023-04-15 DIAGNOSIS — K56699 Other intestinal obstruction unspecified as to partial versus complete obstruction: Secondary | ICD-10-CM | POA: Diagnosis not present

## 2023-04-15 DIAGNOSIS — J984 Other disorders of lung: Secondary | ICD-10-CM | POA: Diagnosis not present

## 2023-04-15 DIAGNOSIS — C181 Malignant neoplasm of appendix: Secondary | ICD-10-CM | POA: Diagnosis not present

## 2023-04-15 LAB — CBC W/ AUTO DIFF
BASOPHILS ABSOLUTE COUNT: 0 10*9/L (ref 0.0–0.1)
BASOPHILS RELATIVE PERCENT: 0.2 %
EOSINOPHILS ABSOLUTE COUNT: 0.1 10*9/L (ref 0.0–0.5)
EOSINOPHILS RELATIVE PERCENT: 2.3 %
HEMATOCRIT: 39.4 % (ref 39.0–48.0)
HEMOGLOBIN: 13.4 g/dL (ref 12.9–16.5)
LYMPHOCYTES ABSOLUTE COUNT: 0.8 10*9/L — ABNORMAL LOW (ref 1.1–3.6)
LYMPHOCYTES RELATIVE PERCENT: 20.1 %
MEAN CORPUSCULAR HEMOGLOBIN CONC: 34 g/dL (ref 32.0–36.0)
MEAN CORPUSCULAR HEMOGLOBIN: 30.7 pg (ref 25.9–32.4)
MEAN CORPUSCULAR VOLUME: 90.1 fL (ref 77.6–95.7)
MEAN PLATELET VOLUME: 6.8 fL (ref 6.8–10.7)
MONOCYTES ABSOLUTE COUNT: 0.9 10*9/L — ABNORMAL HIGH (ref 0.3–0.8)
MONOCYTES RELATIVE PERCENT: 23.6 %
NEUTROPHILS ABSOLUTE COUNT: 2.1 10*9/L (ref 1.8–7.8)
NEUTROPHILS RELATIVE PERCENT: 53.8 %
PLATELET COUNT: 342 10*9/L (ref 150–450)
RED BLOOD CELL COUNT: 4.37 10*12/L (ref 4.26–5.60)
RED CELL DISTRIBUTION WIDTH: 13.5 % (ref 12.2–15.2)
WBC ADJUSTED: 3.9 10*9/L (ref 3.6–11.2)

## 2023-04-15 LAB — BASIC METABOLIC PANEL
ANION GAP: 7 mmol/L (ref 5–14)
BLOOD UREA NITROGEN: 22 mg/dL (ref 9–23)
BUN / CREAT RATIO: 22
CALCIUM: 9.5 mg/dL (ref 8.7–10.4)
CHLORIDE: 100 mmol/L (ref 98–107)
CO2: 28 mmol/L (ref 20.0–31.0)
CREATININE: 1.02 mg/dL
EGFR CKD-EPI (2021) MALE: 83 mL/min/{1.73_m2} (ref >=60)
GLUCOSE RANDOM: 92 mg/dL (ref 70–179)
POTASSIUM: 3.6 mmol/L (ref 3.4–4.8)
SODIUM: 135 mmol/L (ref 135–145)

## 2023-04-15 LAB — PROTIME-INR
INR: 1.64
PROTIME: 18.1 s — ABNORMAL HIGH (ref 9.9–12.6)

## 2023-04-15 LAB — MAGNESIUM: MAGNESIUM: 1.9 mg/dL (ref 1.6–2.6)

## 2023-04-15 LAB — PHOSPHORUS: PHOSPHORUS: 3.6 mg/dL (ref 2.4–5.1)

## 2023-04-15 MED ADMIN — acetaminophen (OFIRMEV) 10 mg/mL injection 1,000 mg: 1000 mg | INTRAVENOUS | @ 14:00:00 | Stop: 2023-04-15

## 2023-04-15 MED ADMIN — potassium chloride 10 mEq in 100 mL IVPB: 10 meq | INTRAVENOUS | @ 22:00:00 | Stop: 2023-04-15

## 2023-04-15 MED ADMIN — sodium chloride (NS) 0.9 % flush 10 mL: 10 mL | INTRAVENOUS | @ 21:00:00

## 2023-04-15 MED ADMIN — metoPROLOL (Lopressor) injection 5 mg: 5 mg | INTRAVENOUS | @ 21:00:00

## 2023-04-15 MED ADMIN — HYDROmorphone (PF) (DILAUDID) injection 1 mg: 1 mg | INTRAVENOUS | @ 22:00:00 | Stop: 2023-04-28

## 2023-04-15 MED ADMIN — lactated Ringers infusion: 125 mL/h | INTRAVENOUS | @ 23:00:00

## 2023-04-15 MED ADMIN — acetaminophen (OFIRMEV) 10 mg/mL injection 1,000 mg: 1000 mg | INTRAVENOUS | @ 06:00:00 | Stop: 2023-04-15

## 2023-04-15 MED ADMIN — pantoprazole (Protonix) injection 40 mg: 40 mg | INTRAVENOUS | @ 14:00:00

## 2023-04-15 MED ADMIN — enoxaparin (LOVENOX) syringe 40 mg: 40 mg | SUBCUTANEOUS | @ 03:00:00

## 2023-04-15 MED ADMIN — tamsulosin (FLOMAX) 24 hr capsule 0.4 mg: .4 mg | ORAL | @ 16:00:00

## 2023-04-15 MED ADMIN — potassium chloride 10 mEq in 100 mL IVPB: 10 meq | INTRAVENOUS | @ 16:00:00 | Stop: 2023-04-15

## 2023-04-15 MED ADMIN — acetaminophen (OFIRMEV) 10 mg/mL injection 1,000 mg: 1000 mg | INTRAVENOUS | @ 21:00:00 | Stop: 2023-04-15

## 2023-04-15 MED ADMIN — HYDROmorphone (PF) (DILAUDID) injection 1 mg: 1 mg | INTRAVENOUS | @ 10:00:00 | Stop: 2023-04-28

## 2023-04-15 MED ADMIN — magnesium sulfate 2gm/50mL IVPB: 2 g | INTRAVENOUS | @ 14:00:00 | Stop: 2023-04-15

## 2023-04-15 MED ADMIN — lactated Ringers infusion: 125 mL/h | INTRAVENOUS | @ 14:00:00

## 2023-04-15 MED ADMIN — lactated Ringers infusion: 125 mL/h | INTRAVENOUS | @ 07:00:00

## 2023-04-15 MED ADMIN — HYDROmorphone (PF) (DILAUDID) injection 1 mg: 1 mg | INTRAVENOUS | @ 14:00:00 | Stop: 2023-04-28

## 2023-04-15 MED ADMIN — HYDROmorphone (PF) (DILAUDID) injection 1 mg: 1 mg | INTRAVENOUS | @ 03:00:00 | Stop: 2023-04-28

## 2023-04-15 MED ADMIN — HYDROmorphone (PF) (DILAUDID) injection 1 mg: 1 mg | INTRAVENOUS | @ 18:00:00 | Stop: 2023-04-28

## 2023-04-15 MED ADMIN — metoPROLOL (Lopressor) injection 5 mg: 5 mg | INTRAVENOUS | @ 16:00:00

## 2023-04-15 MED ADMIN — metoPROLOL (Lopressor) injection 5 mg: 5 mg | INTRAVENOUS | @ 10:00:00

## 2023-04-15 NOTE — Unmapped (Signed)
PICC LINE INSERTION PROCEDURE NOTE    Indications:  TPN    Consent/Time Out:    Risks, benefits and alternatives discussed with patient.  Written Consent was obtained prior to the procedure and is detailed in the medical record.  Prior to the start of the procedure, a time out was taken and the identity of the patient was confirmed via name, medical record number and  date of birth.  The availability of the correct equipment was verified.    Procedure Details:  The vein was identified and measured for appropriate catheter length. Maximum sterile techniques including PPE were utilized per protocol.  Sterile field prepared with necessary supplies and equipment. Insertion site was prepped with chlorhexidine solution and allowed to dry. Lidocaine 2 mL subcutaneously and intradermally administered to insertion site.  The catheter was primed with normal saline.  A 4 FR Double lumen was inserted to the L Basilic vein with 1 insertion attempt(s).  Catheter aspirated, 5 mL blood return present.  The catheter was then flushed with 16 mL of normal saline.   Insertion site cleansed, and sterile dressing applied per manufacturer guidelines. The Central Line Checklist was referenced.  The catheter was inserted without difficulty by Si Raider, RN and assisted by Jacqulyn Liner, RN.    Findings:  Manufacturer:  [employer]  Lot #:  [id number]  CT Injectable (power):  Yes  Arm Circumference:  30.5  cm  Total catheter length:  44 cm.    External length:  0 cm.  Catheter trimmed:  yes  Port reserved:  RED color port reserved for TPN    Vein Size:   Left arm basilic vein compressible:  Yes, measurement:   (See vein image)    Tip Placement Verification:   [employer] and Sherlock    Workup Time:    60 minutes    Recommendations:  PICC brochure given to patient with teaching instructions  Refer to head of bed sign      See images below:

## 2023-04-15 NOTE — Unmapped (Signed)
Surgery Progress Note  * No surgery found [facility] Service: SRA - GI 1 Barrington Ellison) [phone]  Admitting Attending: Surgical Oncology Attendings: Daryl Eastern, DO      Assessment:     Sergio Perez is a 62 y.o. 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     NGT placed for decompression, admitted to the floor.     Interval Events:     NAEON, VSS, afebrile. NGT in place with 750cc overnight recorded, another 450cc in canister on rounds. This morning retaining urine with bladder scan 800cc, able to void 600cc shortly after with PVR 285cc. States he has some trouble with urination at home and was previously on Flomax. Endorses abdominal pain mostly right sided. Feeling bloated but better with NGT. No nausea, vomiting. No flatus or BM.     Plan:       Neuro/Pain:   *Hx of CVA   - Holding home Plavix   *Pain/Nausea   - Scheduled IV Tylenol   - IV Dilaudid prn    - Compazine prn       CV: VSS and normotensive  *Hx of HTN, HLD  - Home metoprolol 100mg  BID held, IV metoprolol 5mg  q6hrs   - Holding home fenofibrate, atorvastatin       Pulm: NWOB and on RA      GI  - F: LR at 125 mL/hr  - E: hypokalemia, replacing K and hypomagnesemia, replacing Mg  - N: NPO with ice chips for comfort, NPO strict @ MN   - NGT to LIWS   - Protonix   - PICC consult and TPN today      Endo:     *Hx of hypothyroidism  - holding levothyroxine for now       Renal/GU:   - Monitor for retention, bladder scan if not voided in 6 hours   - Flomax 0.4mg  daily       Heme/ID:   - Daily CBC   - Lovenox DVT ppx      Dispo: Floor    Please page SRA - GI 1 Lazarus Gowda, [place]) [phone] with any questions.     Attending Attestation:    I supervised the resident for the history and exam, and conducted pertinent aspects of the history and exam after the initial review. I discussed the findings, assessment and plan with the resident and agree with the findings and plan as documented in the resident???s note on the date listed above.        >Functional Status on admission: Partially dependent - The patient requires some assistance from another person for activities of daily living  >ASA class on admission: ASA 4-severe systemic disease that is a constant threat to life  >Dementia on admission: No  >Impaired sensorium/Delirium on admission: No delirium  >Recent history (< 6 months) of cerebrovascular accident and neurological deficit: No   >Congestive heart failure in 30 days prior to surgery: No  >Recent history (< 6 months) of Myocardial Infarction: No   >Recent history (< 6 months) of percutaneous cardiac intervention, stenting or angina: No   >Recent history (< 6 months)  of peripheral vascular disease and /or rest pain: No   >Dyspnea on admission: No  >Current smoker within one year of admission: No  >Chronic Respiratory Failure (requires continuous home O2) on admission : No  >Ascites within 30 days prior to surgery: No  >Liver cirrhosis on admission: None  >Disseminated/Metastatic Cancer on admission: Yes  >Height on admission:   Ht Readings from Last 1 Encounters:   10/29/22 182.9 cm (6')     >BMI on admission: Body mass index is 29.27 kg/m??. BMI 25-29-Overweight  >Malnutrition on admission: Yes mild protein calorie malnutrition (some weight loss <5% in 3 months, decreased food intake over previous week)  >Systemic sepsis/system inflammatory response syndrome within 48hrs prior to surgery or present on admission: None  >Steroid or immunosuppressant use for chronic condition prior to admission: Yes  >Diabetes with or without complications: No  >Chronic Kidney Disease prior to admisison: No        No results found for: GFRNAAM, GFRAAM  >Dialysis for ESRD within 2 weeks prior to surgery: No  >Acute Renal Failure present on admission: No  Creatinine Whole Blood, POC   Date Value Ref Range Status   08/06/2022 0.9 0.8 - 1.4 mg/dL Final     Creatinine   Date Value Ref Range Status   04/15/2023 1.02 0.73 - 1.18 mg/dL Final     Increase SCr >=0.3 mg/dl within 48 hours; or  Increase SCr >=1.5 times baseline, which is known or presumed to have occurred within the prior 7 days; or  Urine volume < 0.5 ml/kg/hr for 6 hours  >Hematologic disease present on admission: None     Lab Results   Component Value Date    WBC 3.9 04/15/2023    HGB 13.4 04/15/2023    HCT 39.4 04/15/2023    PLT 342 04/15/2023        Lab Results   Component Value Date    PT 18.1 (H) 04/15/2023    INR 1.64 04/15/2023         Objective:      Vital Signs:  BP 131/91  - Pulse 105  - Temp 36.6 ??C (97.9 ??F) (Oral)  - Resp 17  - Wt 97.9 kg (215 lb 13.3 oz)  - SpO2 95%  - BMI 29.27 kg/m??     Input/Output:  I/O         09/10 0701  09/11 0700 09/11 0701  09/12 0700 09/12 0701  09/13 0700    IV Piggyback  1200     Total Intake  1200     Urine (mL/kg/hr)  600     Emesis/NG output  750     Stool  0     Total Output(mL/kg)  1350 (13.8)     Net  -150            Urine Occurrence  0 x     Stool Occurrence  0 x             Physical Exam:    General: Cooperative, no distress, well appearing male  Neuro: Alert, oriented, appropriate  HEENT: Normocephalic, atraumatic  Pulmonary: Normal work of breathing, equal bilateral chest rise  Cardiovascular: Regular rate and rhythm  Abdomen: Firm, distended, tender to palpation mostly in right lower quadrant  Extremities: no peripheral edema    Labs:    Lab Results   Component Value Date    WBC 3.9 04/15/2023    HGB 13.4 04/15/2023    HCT 39.4 04/15/2023  PLT 342 04/15/2023       Lab Results   Component Value Date    NA 135 04/15/2023    K 3.6 04/15/2023    CL 100 04/15/2023    CO2 28.0 04/15/2023    BUN 22 04/15/2023    CREATININE 1.02 04/15/2023    CALCIUM 9.5 04/15/2023    MG 1.9 04/15/2023    PHOS 3.6 04/15/2023       Microbiology Results (last day)       ** No results found for the last 24 hours. **            Imaging:   CT A/P from OSH 9/10:   -Multiple dilated loops of small bowel, with somewhat gradual transition to decompressed small bowel in the right lower quadrant. Findings raise a concern for developing partial small bowel obstruction.  -Peritoneum and omental carcinomatosis with increasing loculated appearing inferior perihepatic and perisplenic ascites.  -2.5 cm enhancing lesion in segment 6 of the liver, stable in size.

## 2023-04-15 NOTE — Unmapped (Signed)
Pt is Sergio Perez. VSS. No reports of nausea/vomiting. Pain is managed with PRN pain regimen. NPO no exceptions maintained. PIV x1 in place, infusing LR @ 125. NGT tube in right nare in place, advanced to 70 cm per provider request, provider came to bedside after advancement, set to low-intermittent suction, see intake/output. Pt able to ambulate independently. Call bell within reach. Bed locked and in lowest position.   Problem: Adult Inpatient Plan of Care  Goal: Plan of Care Review  Reactivated  Goal: Patient-Specific Goal (Individualized)  Reactivated  Goal: Absence of Hospital-Acquired Illness or Injury  Reactivated  Intervention: Prevent and Manage VTE (Venous Thromboembolism) Risk  Recent Flowsheet Documentation  Taken 04/14/2023 2045 by Orpah Greek, RN  Anti-Embolism Device Type: SCD, Knee  Anti-Embolism Intervention: Refused  Anti-Embolism Device Location: BLE  Goal: Optimal Comfort and Wellbeing  Reactivated  Goal: Readiness for Transition of Care  Reactivated  Goal: Rounds/Family Conference  Reactivated

## 2023-04-15 NOTE — Unmapped (Signed)
I left a message letting the pt know her rescheduled appt time and date and the clinic number

## 2023-04-15 NOTE — Unmapped (Signed)
Adult Nutrition Assessment Note    Visit Type: MD Consult  Reason for Visit: Parenteral Nutrition    NUTRITION ASSESSMENT     Patient appropriate for parenteral nutrition based on the following ASPEN criteria of bowel obstruction.      Malnutrition Assessment using AND/ASPEN or GLIM Clinical Characteristics:            GLIM Moderate Malnutrition (04/15/23 1729)  Reduced Muscle Mass: Mild to moderate deficit  Reduced Food Intake: Any chronic gastrointestinal condition that adversely impacts food assimilation or absorption  Inflammation: Chronic disease related  Nutrition Focused Physical Exam:  Fat Areas Examined  Orbital: Moderate loss  Upper Arm: Severe loss  Thoracic: Mild loss      Muscle Areas Examined  Temple: Mild loss  Clavicle: Moderate loss  Acromion: Moderate loss  Scapular: Moderate loss  Dorsal Hand: Moderate loss  Patellar: Mild loss  Anterior Thigh: Mild loss  Posterior Calf: No loss              Nutrition Evaluation  Overall Impressions: Moderate fat loss, Mild muscle loss (04/15/23 1728)    Care plan:Yes     NUTRITION INTERVENTIONS and RECOMMENDATION     Renew TPN daily  Daily BMP, mag and phos until stable on TPN       HPI & PMH:  62 y.o. 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     Nutrition History:   April 15, 2023:   Prior to admission: Patient reports poor/minimal PO intakes starting 9/9 related to nausea/vomiting. He reports prior to this appetite has been low over the past year.     Anthropometric Data:  Height:   182.9 cm (6')  as of 10/29/2022 Admission weight: 91.2 kg (201 lb)  Last recorded weight: 97.9 kg (215 lb 13.3 oz)  IBW:    Percent IBW:    BMI: Body mass index is 29.27 kg/m??.   Usual Body Weight:  217 lb    Weight history prior to admission:  Patient reports weight loss as low as 170 lb over the past year PTA. He feels last recorded weight of 215 lb is not accurate (? Fluid related/abd distention vs scale discrepancy)     Wt Readings from Last 10 Encounters:   04/15/23 97.9 kg (215 lb 13.3 oz)   03/18/23 91.5 kg (201 lb 12.8 oz)   03/04/23 90.2 kg (198 lb 14.4 oz)   02/18/23 90.2 kg (198 lb 15.4 oz)   02/05/23 90.8 kg (200 lb 3.2 oz)   01/21/23 89.7 kg (197 lb 10.3 oz)   01/07/23 90.6 kg (199 lb 13.6 oz)   12/24/22 89.7 kg (197 lb 12.8 oz)   12/10/22 88.6 kg (195 lb 4.8 oz)   11/26/22 87.3 kg (192 lb 7.4 oz)        Weight changes this admission:   Last 5 Recorded Weights    04/14/23 1419 04/15/23 0546   Weight: 91.2 kg (201 lb) 97.9 kg (215 lb 13.3 oz)        NUTRITIONALLY RELEVANT DATA     Medications:   Nutritionally pertinent medications reviewed and evaluated for potential food and/or medication  interactions and include pantoprazole, LR at 125 ml/hr , and KCl 20 meq    TPN Order:   Recent TPN Orders (Show up to 1 orders ; newest on the right.)       Start date and time  04/15/2023 2200       Parenteral Nutrition (CENTRAL) [[id number]]     Order Status Active     Frequency Continuous        Macro Ingredients    amino acid (CLINISOL SF) 15% 80 g     dextrose 150 g        Electrolytes    sodium chloride 75 mEq     potassium phosphate 15 mmol     magnesium sulfate dilution 8 mEq        Additives    multivitamin, adult injection 10 mL     trace elements (TRALEMENT) Zn-Cu-Mn-Se 1 mL        Medications    thiamine 180 mg        QS Base    sterile water 171.89 mL        Energy Contribution    Proteins 320 kcal     Dextrose 510 kcal     Lipids --     Total 830 kcal        Electrolyte Ion Calculated Amount    Sodium 75 mEq     Potassium 22 mEq Calcium --     Magnesium 8 mEq     Aluminum --     Phosphate 15 mmol     Chloride 75 mEq     Acetate --     Chloride: Acetate Ratio --        Trace Elements    Copper 0.3 mg     Manganese 55 mcg     Selenium 60 mcg     Zinc 3 mg        Other    Total Amino Acid 80 g     Total Amino Acid/kg 0.82 g/kg     Glucose Infusion Rate 1.06 mg/kg/min     Osmolarity 1,752.74     Volume 960 mL     Rate 40 mL/hr     Dosing Weight 97.9 kg     Infusion Site Central     Total Multi-vitamins 10 mL        Admin Instructions      Please infuse using a parenteral nutrition tubing set (non-DEHP) with a 1.2-micron filter. Listed in [place] as Set IV for Parenteral Nutrition # [id number].    central line only             Labs:   Lab Results   Component Value Date    NA 135 04/15/2023    K 3.6 04/15/2023    CL 100 04/15/2023    CO2 28.0 04/15/2023    BUN 22 04/15/2023    CREATININE 1.02 04/15/2023    GLU 92 04/15/2023    CALCIUM 9.5 04/15/2023    ALBUMIN 4.1 04/14/2023    PHOS 3.6 04/15/2023      Recent Labs     Units 04/15/23  0447   MG mg/dL 1.9       Lab Results   Component Value Date    ALKPHOS 83 04/14/2023    BILITOT 0.5 04/14/2023    PROT 8.3 (H) 04/14/2023    ALBUMIN 4.1 04/14/2023    ALT 14 04/14/2023    AST 20 04/14/2023    No results for  input(s): TRIG in the last 168 hours.         Current Nutrition:  Oral intake   Parenteral nutrition via PICC   Placement Date: 04/15/23  Placed by VAT team     Nutrition Orders            Parenteral Nutrition (CENTRAL) at 40 mL/hr starting at 09/12 2200    NPO Sips with ice chips; Medically necessary: NPO starting at 09/12 0805            Nutritional Needs:   Daily Estimated Nutrient Needs:  Energy: 0981-1914 kcals Per [place] [employer]-Jeor Equation (x 1-1.2 AF) using admission body weight, 98 kg (04/15/23 1438)]  Protein: 114-136 gm [25% of kcal using admission body weight, 98 kg (04/15/23 1438)]  Carbohydrate:   [45-60% of kcal]  Fluid:   mL [1 mL/kcal (maintenance)]    Allergies, Intolerances, Sensitivities, and/or Cultural/Religious Dietary Restrictions: none identified at this time     GOALS and EVALUATION     Patient to meet 60% or greater of nutritional needs via parenteral nutrition within admit.  - New    Motivation, Barriers, and Compliance:  Evaluation of motivation, barriers, and compliance completed. No concerns identified at this time.       Discharge Planning:   Monitor for potential discharge needs with multi-disciplinary team.     Follow-Up Parameters:   Monitor daily while on TPN    [facility] MS, RD, LD, CNSC  [phone]

## 2023-04-15 NOTE — Unmapped (Signed)
PICC LINE TRIAGE NOTE    The Venous Access Team has received an order for PICC placement.  This patient has been triaged and  VAT will attempt bedside placement as schedule permits .    See PICC Specific History Flowsheet for additional details.    Thank You,    Marylee Floras, RN Venous Access Team [phone]      Workup Time:  45 minutes

## 2023-04-16 DIAGNOSIS — C786 Secondary malignant neoplasm of retroperitoneum and peritoneum: Secondary | ICD-10-CM | POA: Diagnosis not present

## 2023-04-16 DIAGNOSIS — K56699 Other intestinal obstruction unspecified as to partial versus complete obstruction: Secondary | ICD-10-CM | POA: Diagnosis not present

## 2023-04-16 DIAGNOSIS — Z8509 Personal history of malignant neoplasm of other digestive organs: Secondary | ICD-10-CM | POA: Diagnosis not present

## 2023-04-16 DIAGNOSIS — C181 Malignant neoplasm of appendix: Secondary | ICD-10-CM | POA: Diagnosis not present

## 2023-04-16 LAB — CBC W/ AUTO DIFF
BASOPHILS ABSOLUTE COUNT: 0 10*9/L (ref 0.0–0.1)
BASOPHILS RELATIVE PERCENT: 0.4 %
EOSINOPHILS ABSOLUTE COUNT: 0.1 10*9/L (ref 0.0–0.5)
EOSINOPHILS RELATIVE PERCENT: 1.5 %
HEMATOCRIT: 38.4 % — ABNORMAL LOW (ref 39.0–48.0)
HEMOGLOBIN: 12.7 g/dL — ABNORMAL LOW (ref 12.9–16.5)
LYMPHOCYTES ABSOLUTE COUNT: 0.6 10*9/L — ABNORMAL LOW (ref 1.1–3.6)
LYMPHOCYTES RELATIVE PERCENT: 14.3 %
MEAN CORPUSCULAR HEMOGLOBIN CONC: 33.1 g/dL (ref 32.0–36.0)
MEAN CORPUSCULAR HEMOGLOBIN: 30.1 pg (ref 25.9–32.4)
MEAN CORPUSCULAR VOLUME: 90.8 fL (ref 77.6–95.7)
MEAN PLATELET VOLUME: 7.4 fL (ref 6.8–10.7)
MONOCYTES ABSOLUTE COUNT: 0.6 10*9/L (ref 0.3–0.8)
MONOCYTES RELATIVE PERCENT: 15.1 %
NEUTROPHILS ABSOLUTE COUNT: 2.7 10*9/L (ref 1.8–7.8)
NEUTROPHILS RELATIVE PERCENT: 68.7 %
PLATELET COUNT: 359 10*9/L (ref 150–450)
RED BLOOD CELL COUNT: 4.23 10*12/L — ABNORMAL LOW (ref 4.26–5.60)
RED CELL DISTRIBUTION WIDTH: 13.1 % (ref 12.2–15.2)
WBC ADJUSTED: 4 10*9/L (ref 3.6–11.2)

## 2023-04-16 LAB — SLIDE REVIEW

## 2023-04-16 LAB — BASIC METABOLIC PANEL
ANION GAP: 8 mmol/L (ref 5–14)
BLOOD UREA NITROGEN: 17 mg/dL (ref 9–23)
BUN / CREAT RATIO: 27
CALCIUM: 8.8 mg/dL (ref 8.7–10.4)
CHLORIDE: 103 mmol/L (ref 98–107)
CO2: 26 mmol/L (ref 20.0–31.0)
CREATININE: 0.64 mg/dL — ABNORMAL LOW
EGFR CKD-EPI (2021) MALE: >90 mL/min/{1.73_m2} (ref >=60)
GLUCOSE RANDOM: 105 mg/dL (ref 70–179)
POTASSIUM: 3.4 mmol/L (ref 3.4–4.8)
SODIUM: 137 mmol/L (ref 135–145)

## 2023-04-16 LAB — HEPATIC FUNCTION PANEL
ALBUMIN: 3.2 g/dL — ABNORMAL LOW (ref 3.4–5.0)
ALKALINE PHOSPHATASE: 60 U/L (ref 46–116)
ALT (SGPT): 9 U/L — ABNORMAL LOW (ref 10–49)
AST (SGOT): 14 U/L (ref <=34)
BILIRUBIN DIRECT: 0.1 mg/dL (ref 0.00–0.30)
BILIRUBIN TOTAL: 0.2 mg/dL — ABNORMAL LOW (ref 0.3–1.2)
PROTEIN TOTAL: 6.8 g/dL (ref 5.7–8.2)

## 2023-04-16 LAB — PHOSPHORUS: PHOSPHORUS: 2.6 mg/dL (ref 2.4–5.1)

## 2023-04-16 LAB — TRIGLYCERIDES: TRIGLYCERIDES: 185 mg/dL — ABNORMAL HIGH (ref 0–150)

## 2023-04-16 LAB — MAGNESIUM: MAGNESIUM: 1.9 mg/dL (ref 1.6–2.6)

## 2023-04-16 MED ADMIN — albumin human 5 %: INTRAVENOUS | @ 14:00:00 | Stop: 2023-04-16

## 2023-04-16 MED ADMIN — ROCuronium (ZEMURON) injection: INTRAVENOUS | @ 15:00:00 | Stop: 2023-04-16

## 2023-04-16 MED ADMIN — HYDROmorphone (PF) (DILAUDID) injection 1 mg: 1 mg | INTRAVENOUS | @ 10:00:00 | Stop: 2023-04-16

## 2023-04-16 MED ADMIN — morphine 4 mg/mL injection 4 mg: 4 mg | INTRAVENOUS | @ 06:00:00 | Stop: 2023-04-16

## 2023-04-16 MED ADMIN — Propofol (DIPRIVAN) injection: INTRAVENOUS | @ 14:00:00 | Stop: 2023-04-16

## 2023-04-16 MED ADMIN — sodium chloride (NS) 0.9 % flush 10 mL: 10 mL | INTRAVENOUS | @ 04:00:00

## 2023-04-16 MED ADMIN — methocarbamol (ROBAXIN) 500 mg in sodium chloride (NS) 0.9 % 50 mL IVPB: 500 mg | INTRAVENOUS | @ 21:00:00

## 2023-04-16 MED ADMIN — electrolyte-A (PLASMA-LYT A) infusion: INTRAVENOUS | @ 14:00:00 | Stop: 2023-04-16

## 2023-04-16 MED ADMIN — sodium chloride irrigation (NS) 0.9 % irrigation solution: @ 14:00:00 | Stop: 2023-04-16

## 2023-04-16 MED ADMIN — HYDROmorphone (PF) injection Syrg 0.5 mg: .5 mg | INTRAVENOUS | @ 02:00:00 | Stop: 2023-04-28

## 2023-04-16 MED ADMIN — ROCuronium (ZEMURON) injection: INTRAVENOUS | @ 16:00:00 | Stop: 2023-04-16

## 2023-04-16 MED ADMIN — sodium chloride (NS) 0.9 % flush 10 mL: 10 mL | INTRAVENOUS | @ 13:00:00

## 2023-04-16 MED ADMIN — bupivacaine (PF) (MARCAINE) 0.25 % (2.5 mg/mL) injection (PF): @ 17:00:00 | Stop: 2023-04-16

## 2023-04-16 MED ADMIN — lactated Ringers infusion: INTRAVENOUS | @ 14:00:00 | Stop: 2023-04-16

## 2023-04-16 MED ADMIN — fentaNYL (PF) (SUBLIMAZE) injection 25 mcg: 25 ug | INTRAVENOUS | @ 18:00:00 | Stop: 2023-04-16

## 2023-04-16 MED ADMIN — enoxaparin (LOVENOX) syringe 40 mg: 40 mg | SUBCUTANEOUS | @ 04:00:00

## 2023-04-16 MED ADMIN — dexmedeTOMIDine (Precedex) 80 mcg/20 mL (4 mcg/mL) injection: INTRAVENOUS | @ 16:00:00 | Stop: 2023-04-16

## 2023-04-16 MED ADMIN — HYDROmorphone (PF) injection Syrg 0.5 mg: .5 mg | INTRAVENOUS | @ 18:00:00 | Stop: 2023-04-16

## 2023-04-16 MED ADMIN — lactated Ringers infusion: 125 mL/h | INTRAVENOUS | @ 09:00:00 | Stop: 2023-04-16

## 2023-04-16 MED ADMIN — HYDROmorphone (DILAUDID) 50mg/50ml (1mg/ml) PCA CADD: INTRAVENOUS | @ 19:00:00 | Stop: 2023-04-30

## 2023-04-16 MED ADMIN — dexAMETHasone (DECADRON) 4 mg/mL injection: INTRAVENOUS | @ 15:00:00 | Stop: 2023-04-16

## 2023-04-16 MED ADMIN — Propofol (DIPRIVAN) injection: INTRAVENOUS | @ 15:00:00 | Stop: 2023-04-16

## 2023-04-16 MED ADMIN — acetaminophen (OFIRMEV) 10 mg/mL injection 1,000 mg: 1000 mg | INTRAVENOUS | @ 12:00:00 | Stop: 2023-04-17

## 2023-04-16 MED ADMIN — HYDROmorphone (PF) injection Syrg 0.5 mg: .5 mg | INTRAVENOUS | @ 19:00:00 | Stop: 2023-04-16

## 2023-04-16 MED ADMIN — metoPROLOL (Lopressor) injection 5 mg: 5 mg | INTRAVENOUS | @ 21:00:00

## 2023-04-16 MED ADMIN — succinylcholine chloride (ANECTINE) injection: INTRAVENOUS | @ 14:00:00 | Stop: 2023-04-16

## 2023-04-16 MED ADMIN — pantoprazole (Protonix) injection 40 mg: 40 mg | INTRAVENOUS | @ 12:00:00

## 2023-04-16 MED ADMIN — fentaNYL (PF) (SUBLIMAZE) injection: INTRAVENOUS | @ 15:00:00 | Stop: 2023-04-16

## 2023-04-16 MED ADMIN — metoPROLOL (Lopressor) injection 5 mg: 5 mg | INTRAVENOUS | @ 10:00:00

## 2023-04-16 MED ADMIN — fat emulsion 20 % with fish oil (SMOFlipid) infusion 250 mL: 250 mL | INTRAVENOUS | @ 03:00:00 | Stop: 2023-04-16

## 2023-04-16 MED ADMIN — Parenteral Nutrition (CENTRAL): INTRAVENOUS | @ 03:00:00 | Stop: 2023-04-16

## 2023-04-16 MED ADMIN — fentaNYL (PF) (SUBLIMAZE) injection: INTRAVENOUS | @ 14:00:00 | Stop: 2023-04-16

## 2023-04-16 MED ADMIN — dextrose 5 % and sodium chloride 0.45 % infusion: 125 mL/h | INTRAVENOUS | @ 22:00:00

## 2023-04-16 MED ADMIN — dexmedeTOMIDine (Precedex) 80 mcg/20 mL (4 mcg/mL) injection: INTRAVENOUS | @ 17:00:00 | Stop: 2023-04-16

## 2023-04-16 MED ADMIN — LORazepam (ATIVAN) injection 0.5 mg: .5 mg | INTRAVENOUS | @ 20:00:00 | Stop: 2023-04-16

## 2023-04-16 MED ADMIN — metoPROLOL (Lopressor) injection 5 mg: 5 mg | INTRAVENOUS | @ 04:00:00

## 2023-04-16 MED ADMIN — sugammadex (BRIDION) injection: INTRAVENOUS | @ 17:00:00 | Stop: 2023-04-16

## 2023-04-16 MED ADMIN — ondansetron (ZOFRAN) injection: INTRAVENOUS | @ 16:00:00 | Stop: 2023-04-16

## 2023-04-16 MED ADMIN — fentaNYL (PF) (SUBLIMAZE) injection: INTRAVENOUS | @ 17:00:00 | Stop: 2023-04-16

## 2023-04-16 MED ADMIN — fentaNYL (PF) (SUBLIMAZE) injection: INTRAVENOUS | @ 16:00:00 | Stop: 2023-04-16

## 2023-04-16 MED ADMIN — fentaNYL (PF) (SUBLIMAZE) injection: INTRAVENOUS | @ 18:00:00 | Stop: 2023-04-16

## 2023-04-16 MED ADMIN — metroNIDAZOLE (FLAGYL) IVPB 500 mg: 500 mg | INTRAVENOUS | @ 15:00:00 | Stop: 2023-04-16

## 2023-04-16 MED ADMIN — acetaminophen (OFIRMEV) 10 mg/mL injection 1,000 mg: 1000 mg | INTRAVENOUS | @ 04:00:00 | Stop: 2023-04-15

## 2023-04-16 MED ADMIN — midazolam (VERSED) injection: INTRAVENOUS | @ 14:00:00 | Stop: 2023-04-16

## 2023-04-16 MED ADMIN — cefTRIAXone (ROCEPHIN) 2 g in sodium chloride 0.9 % (NS) 100 mL IVPB-MBP: 2 g | INTRAVENOUS | @ 15:00:00 | Stop: 2023-04-16

## 2023-04-16 MED ADMIN — ondansetron (ZOFRAN) injection 4 mg: 4 mg | INTRAVENOUS | @ 13:00:00

## 2023-04-16 NOTE — Unmapped (Signed)
Surgery Progress Note  * No surgery date entered [facility] Service: [facility] - GI 1 [address]) [phone]  Admitting Attending: Surgical Oncology Attendings: Daryl Eastern, DO      Assessment:     Sergio Perez is a 62 y.o. 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     NGT placed for decompression. OR today for Exploratory laparotomy.     Interval Events:     No acute events overnight, NG w/ 650 ml of bilious output. Passing flatus this AM. Pain poorly controlled. Able to void     Plan:       Neuro/Pain:   *Hx of CVA   - Holding home Plavix   *Pain/Nausea   - Scheduled IV Tylenol   - IV Dilaudid prn    - Compazine prn       CV: VSS and normotensive  *Hx of HTN, HLD  - Home metoprolol 100mg  BID held, IV metoprolol 5mg  q6hrs   - Holding home fenofibrate, atorvastatin       Pulm: NWOB and on RA      GI  - F: LR at 125 mL/hr  - E: hypokalemia, replacing K and hypomagnesemia, replacing Mg  - N: NPO with ice chips for comfort, NPO strict @ MN   - NGT to LIWS   - Protonix   - PICC place 9/12, Renew TPN     *Malignant SBO    - OR today for Exploratory laparotomy, possible bowel resection, possible ostomy, possible G tube placement   - Consent obtained and uploaded to chart       Endo:     *Hx of hypothyroidism  - holding levothyroxine for now       Renal/GU:   - Monitor for retention, bladder scan if not voided in 6 hours   - Flomax 0.4mg  daily       Heme/ID:   - Daily CBC   - Lovenox DVT ppx      Dispo: Floor    Please page [facility] - GI 1 [address], [place]) [phone] with any questions.     Attending Attestation:    I supervised the resident for the history and exam, and conducted pertinent aspects of the history and exam after the initial review. I discussed the findings, assessment and plan with the resident and agree with the findings and plan as documented in the resident???s note on the date listed above.    Plan for exploration to relieve the MBO.  Discussed with Sergio Perez and his sister.     Objective:      Vital Signs:  BP 137/81  - Pulse 104  - Temp 36.5 ??C (97.7 ??F) (Axillary)  - Resp 16  - Wt 97.9 kg (215 lb 13.3 oz)  - SpO2 94%  - BMI 29.27 kg/m??     Input/Output:  I/O         09/11 0701  09/12 0700 09/12 0701  09/13 0700    P.O. 0 0    IV Piggyback 1200     Total Intake 1200 0    Urine (mL/kg/hr) 600 480 (0.2)  Emesis/NG output 750 775    Stool 0 0    Blood  4    Total Output(mL/kg) 1350 (13.8) 1259 (12.9)    Net -150 -1259          Urine Occurrence 0 x 0 x    Stool Occurrence 0 x 0 x    Emesis Occurrence 0 x 0 x            Physical Exam:    General: Cooperative, no distress, well appearing male  Neuro: Alert, oriented, appropriate  HEENT: Normocephalic, atraumatic  Pulmonary: Normal work of breathing, equal bilateral chest rise  Cardiovascular: Regular rate and rhythm  Abdomen: Firm, distended, tender to palpation mostly in right lower quadrant  Extremities: no peripheral edema    Labs:    Lab Results   Component Value Date    WBC 4.0 04/16/2023    HGB 12.7 (L) 04/16/2023    HCT 38.4 (L) 04/16/2023    PLT 359 04/16/2023       Lab Results   Component Value Date    NA 137 04/16/2023    K 3.4 04/16/2023    CL 103 04/16/2023    CO2 26.0 04/16/2023    BUN 17 04/16/2023    CREATININE 0.64 (L) 04/16/2023    CALCIUM 8.8 04/16/2023    MG 1.9 04/16/2023    PHOS 2.6 04/16/2023       Microbiology Results (last day)       ** No results found for the last 24 hours. **            Imaging:   CT A/P from OSH 9/10:   -Multiple dilated loops of small bowel, with somewhat gradual transition to decompressed small bowel in the right lower quadrant. Findings raise a concern for developing partial small bowel obstruction.  -Peritoneum and omental carcinomatosis with increasing loculated appearing inferior perihepatic and perisplenic ascites.  -2.5 cm enhancing lesion in segment 6 of the liver, stable in size.

## 2023-04-16 NOTE — Unmapped (Signed)
Pt complain of pain , pain meds seem to work for a Sergio Perez while than pt would be back in pain. Patient didn't get any sleep, pt was very anxious due to he was terrified of having surgery. Pt bed in low position and call bell in reach.

## 2023-04-16 NOTE — Unmapped (Signed)
PICC DRESSING CHANGE    The Venous Access Team (VAT) has changed the PICC  dressing per protocol.  Site is clean, dry and intact with no new signs or symptoms of infection or irritation noted.      If any further issues arise with this line please notify VAT by placing a Peripheral Intravenous Device by Vascular Access Team order and note the issue in the comment section.     Newly paced PICC bleeding at insertion site approximately 30 minutes s/p placement. Once dressing changed, clot noted at insertion, no new bleeding. Site cleaned, unable to remove entire clot.    Thank You,    Jacqulyn Liner, RN RN Venous Access Team [phone]     Workup Time:  30 minutess

## 2023-04-16 NOTE — Unmapped (Signed)
Brief Operative Note  (CSN: [id number])      Date of Surgery: 04/16/2023    Pre-op Diagnosis: Malginant SBO    Post-op Diagnosis: Peritoneal carcinomatosis, malignant SBO    Procedure(s):  EXPLORATORY LAPAROTOMY, EXPLORATORY CELIOTOMY WITH OR WITHOUT BIOPSY(S): 49000 (CPT??)  ENTERECTOMY SM INTES; SNGL RESECT & ANASTOM: 44120 (CPT??)  OMENTECTOMY, EPIPLOECTOMY, RESECTION OF OMENTUM: 49255 (CPT??)  Note: Revisions to procedures should be made in chart - see Procedures activity.    Performing Service: Surgical Oncology  Surgeons and Role:     * Lynnell Dike, DO - Primary     * Emilio Aspen, MD - Resident - Assisting    Assistant: None    Findings: Extensive peritoneal carcinomatosis extending along bilateral pericolic cutters, throughout pelvis and through small bowel mesentery; malignant obstruction at previous ileocolic anastomosis with high grade obstruction, significant omental caking with implants, 24 Fr G tube     Anesthesia: General    Estimated Blood Loss: 400 mL    Complications: None    Specimens:   ID Type Source Tests Collected by Time Destination   1 : Subcutaneous Scar Nodule Tissue Abdomen SURGICAL PATHOLOGY Crista Elliot, DO 04/16/2023 1113    2 : Ileocolic Anastamosis Tissue Abdomen SURGICAL PATHOLOGY Crista Elliot, DO 04/16/2023 1156    3 : Greater Omentum Tissue Abdomen SURGICAL PATHOLOGY EXAM Lynnell Dike, DO 04/16/2023 1159        Implants: * No implants in log *    Surgeon Notes: Dr. Pearlean Brownie was present and scrubbed for the entire case.     Emilio Aspen, MD   Date: 04/16/2023  Time: 1:49 PM

## 2023-04-16 NOTE — Unmapped (Signed)
EXPLORATORY LAPAROTOMY, EXPLORATORY CELIOTOMY WITH OR WITHOUT BIOPSY(S), ENTERECTOMY SM INTES; SNGL RESECT & ANASTOM, OMENTECTOMY, EPIPLOECTOMY, RESECTION OF OMENTUM  Operative Note (CSN: [id number])    Service    Date of Surgery: 04/16/2023  Admit Date: 04/14/2023  Performing Service: Surgical Oncology  Surgeons and Role:     * Lynnell Dike, DO - Primary     * Emilio Aspen, MD - Resident - Assisting      Operative Note    Pre-op Diagnosis: Malginant SBO    Post-op Diagnosis: Same    Note: Revisions to procedures should be made in chart - see Procedures tab.  EXPLORATORY LAPAROTOMY, EXPLORATORY CELIOTOMY WITH OR WITHOUT BIOPSY(S)  ENTERECTOMY SM INTES; SNGL RESECT & ANASTOM  OMENTECTOMY, EPIPLOECTOMY, RESECTION OF OMENTUM    Procedure:  Exploratory Laparotomy  Evacuation malignant ascites  Resection ileocolic anastomosis  Omentectomy  Stapled ileocolic anastomosis  24 Fr Stamm Gastrotomy    Findings:   PCI Score ; Central 3, RUQ 3, epigastrum 1, LUQ 1, L flank 0, LLQ 2, Pelvis 2, RLQ 1, R flank 1, Upper/Lower Jejunum 1/1, Upper/Lower Ileum 1/1  -CCR 2-incomplete cytoreduction, residual disease remaining in all quadrants.  -Diffuse carcinomatosis in all quadrants.  The site of obstruction was the RUQ at the prior ileocolic anastomosis.  The prior anastomosis was encased in tumor and adherent to the R retroperitoneum.  This was mobilized and resected and a stapled ileocolic anastomosis performed.  -Omentum was removed, completely overtaken with tumor involvement.  -24 Fr Stamm gastrostomy tube placed at prior G tube site and meant as a venting G tube if future obstruction develops.    Anesthesia: General    EBL: 400 mL    IVF:    Specimens:   ID Type Source Tests Collected by Time Destination   1 : Subcutaneous Scar Nodule Tissue Abdomen SURGICAL PATHOLOGY Sergio Elliot, DO 04/16/2023 1113    2 : Ileocolic Anastamosis Tissue Abdomen SURGICAL PATHOLOGY Sergio Elliot, DO 04/16/2023 1156    3 : Greater Omentum Tissue Abdomen SURGICAL PATHOLOGY EXAM Lynnell Dike, DO 04/16/2023 1159        Indications for Surgery:   Mr Sergio Perez is a 62 year old male with a history of appendiceal adenocarcinoma who previously underwent right colectomy.  He presented to the hospital emergency room with nausea, vomiting and abdominal distention.  CT scan imaging was concerning for a malignant bowel obstruction.  He was found to have omental caking as well as ascites and multiple dilated loops of small intestine with an obstruction noted in the right hemiabdomen.  He presents to the operating room today for the above procedure.    Description of Procedure:    After informed consent was obtained, Mr. Manwell was taken to the OR suite and placed supine on the OR table.  General anesthesia was induced and SCDs were placed on both lower extremities.  Preoperative antibiotics were administered.  A Foley catheter was inserted.  His abdomen is prepped and draped in sterile fashion an operative timeout was performed.    We began by making a generous midline incision from the mid epigastrium down towards the pubis symphysis.  Electrocautery was used to dissected the subcutaneous tissue down to the underlying fascia.  There was an abnormal soft tissue nodule within the subcutaneous tissue.  This was resected and sent to pathology for permanent analysis.  The fascia was opened along the length of the incision.  We gained entrance into the abdominal cavity.  We evacuated approximately 1 L of brown, turbid malignant ascites.    The omentum was completely overtaken with firm tumor plaque and represented a dense sheet of fibrous tumor.  It was adherent to the anterior abdominal wall near the umbilicus and we had to dissect sharply with a 15 scalpel in order to remove it from the abdominal wall attachments.  We were able to place a Thompson retractor at this juncture.    Visual inspection of the entire abdominal cavity was performed.  The PCI score was calculated as detailed above.  There was confluent tumor plaque involving the right diaphragm.  The epigastric diaphragm and left upper quadrant contain less disease.  Both the left and right flank were also minimally visible with disease.  The left lower quadrant and right lower quadrant each had small miliary tumor nodularity with firm tumor plaques throughout.  The pelvis contained more disease than the right lower quadrant or left lower quadrant but not quite confluent and less so than the right upper quadrant.  As previously noted the omentum was completely overtaken with dense firm tumor plaque.  We ran the small bowel from proximal to distal.  There was minimal serosal disease but there was rather extensive scattered miliary disease on the small bowel mesentery involving both the jejunum and the ileum.  The disease was not plaque-like or confluent but was still rather moderate in nature.    I did not feel that this was the appropriate time to proceed with cytoreduction and/or HIPEC given the obstructive process as well as associated failure to thrive and protein calorie malnutrition.  We ultimately identified the site of obstruction to be within the right lower abdomen.  I felt it was more prudent to deal with the obstruction and relieve him of his symptoms as opposed to performing an oncologically complete operation.  He had a previous gastrostomy tube due to a history of oropharyngeal cancer and I felt that it was reasonable to proceed with gastrostomy tube placement at the end of the procedure should he develop additional obstructive symptoms in the future.  We considered briefly performing a loop ileostomy versus an internal bypass between the distal ileum and the mid transverse colon, bypassing the previous ileocolic anastomosis which is where the site of obstruction was located versus resecting the ileocolic anastomosis and recreating this.  Ultimately I made the decision to proceed with resection of the previous ileocolic anastomosis which would also allow en bloc removal of the majority of the omentum and we will perform a separate omentectomy as well in order to clear a lot of the anterior tumor bulk.  My hope is that this would give him some symptomatic relief as well as control of his ascites and eventually allow him to get 2 additional systemic chemotherapy.  If he were to obstruct in the future, the Stamm gastrostomy site would serve as a potential venting G-tube or even a location for percutaneous G-tube placement if the G-tube were no longer in place.  Lastly, I did not feel he had any additional impending sites of obstruction.  As previously noted, the small bowel serosa was relatively uninvolved and other than some scattered small tumor nodules within the pelvis, the colon was relatively spared as well.    Attention was turned towards mobilizing the small intestine and previous ileocolic anastomosis.  This was a little more difficult as he had previously undergone right colectomy and there was diffuse carcinomatosis involving much of these surfaces.  Nonetheless, we were  able to enter the lesser sac of the abdomen and developed the fused mesentery plane between the middle colic and gastroepiploic vessels.  We were able to follow this plane posteriorly and laterally towards Morison's pouch and ultimately identify the second and third portion of the duodenum.  We were able to perform a right medial visceral rotation and mobilized the ileocolic anastomosis as well as small intestine into the wound.  We were able to avoid injury to the gonadal vessels or to the right ureter.  The small intestine was significantly dilated from the ligament of Treitz all the way to the level of the ileocolic anastomosis which represented the site of obstruction.  Photos were taken for documentation and included in this operative report.    We divided the small intestine proximal to the obstruction and the transverse colon distal to the obstruction with a GIA 80 stapler.  The mesentery was taken with the LigaSure device.  We then performed a stapled antiperistaltic side-to-side anastomosis.  A 3-0 silk Lembert suture was placed on the antimesenteric border of each segment of intestine.  An enterotomy and colotomy were created with electrocautery.  A GIA 80 stapler was advanced into each lumen, reapproximated along the antimesenteric border and fired.  The mucosal defect was reapproximated with Allis clamps and the anastomosis was completed with a TA 60 stapler.  The staple line was oversewn with 3-0 silk Lembert sutures as well as at the apex of the anastomosis.    The greater omentum was then dissected off the remainder of the transverse colon and the splenic flexure.  There was also resected off of the greater curvature of the stomach taking care to avoid injury to the gastroepiploic vessels.    Lastly, we proceeded with Stamm gastrostomy tube placement.  The stomach was still adherent to the left upper quadrant at the prior G-tube site.  We had to resect this off of the abdominal wall with a 15 scalpel.  We actually encountered a previous T-fastener.  2 concentric 3-0 silk pursestring sutures were placed and a gastrotomy was created with electrocautery.  A 24 French Malecot catheter was placed through the gastrotomy and secured with the pursestrings.  It was then delivered through the left upper quadrant abdominal wall and secured to the abdominal wall with 4 separate Lembert sutures and secured to the skin with a 2-0 nylon.    A preliminary sponge and needle count were performed and noted to be correct.  The Thompson retractor was removed.  The abdomen was irrigated and hemostasis was ensured.  There was no contamination during the procedure.  The fascia was then closed with #1 looped PDS in a running fashion.  The skin edges were irrigated with saline and then closed with 3-0 Vicryl deep dermals followed by running 4 Monocryl subcuticular and topical Dermabond pernio.    All final sponge and needle counts were correct x 2 and confirmed with a wand device.  The patient was awoken, extubated and transported to PACU in stable condition.  He tolerated procedure well with no immediate complications.  His family was updated on the intraoperative findings and his operative course.  I was present for and participated in the entire procedure.      Ileocolic anastomosis with omental caking.  This represented the site of obstruction.      Omental caking      Small bowel mesenteric tumor nodules      Small bowel mesenteric tumor nodules  Lynnell Dike, DO   Date: 04/16/2023  Time: 2:14 PM

## 2023-04-17 LAB — BASIC METABOLIC PANEL
ANION GAP: 6 mmol/L (ref 5–14)
ANION GAP: 3 mmol/L — ABNORMAL LOW (ref 5–14)
BLOOD UREA NITROGEN: 14 mg/dL (ref 9–23)
BLOOD UREA NITROGEN: 15 mg/dL (ref 9–23)
BUN / CREAT RATIO: 23
BUN / CREAT RATIO: 23
CALCIUM: 8.7 mg/dL (ref 8.7–10.4)
CALCIUM: 8.9 mg/dL (ref 8.7–10.4)
CHLORIDE: 105 mmol/L (ref 98–107)
CHLORIDE: 106 mmol/L (ref 98–107)
CO2: 27 mmol/L (ref 20.0–31.0)
CO2: 31 mmol/L (ref 20.0–31.0)
CREATININE: 0.62 mg/dL — ABNORMAL LOW
CREATININE: 0.66 mg/dL — ABNORMAL LOW
EGFR CKD-EPI (2021) MALE: >90 mL/min/{1.73_m2} (ref >=60)
EGFR CKD-EPI (2021) MALE: >90 mL/min/{1.73_m2} (ref >=60)
GLUCOSE RANDOM: 143 mg/dL (ref 70–179)
GLUCOSE RANDOM: 114 mg/dL (ref 70–179)
POTASSIUM: 3.5 mmol/L (ref 3.4–4.8)
POTASSIUM: 4 mmol/L (ref 3.4–4.8)
SODIUM: 138 mmol/L (ref 135–145)
SODIUM: 140 mmol/L (ref 135–145)

## 2023-04-17 LAB — MAGNESIUM: MAGNESIUM: 1.9 mg/dL (ref 1.6–2.6)

## 2023-04-17 LAB — CBC W/ AUTO DIFF
BASOPHILS ABSOLUTE COUNT: 0 10*9/L (ref 0.0–0.1)
BASOPHILS RELATIVE PERCENT: 0.2 %
EOSINOPHILS ABSOLUTE COUNT: 0 10*9/L (ref 0.0–0.5)
EOSINOPHILS RELATIVE PERCENT: 0.1 %
HEMATOCRIT: 38.3 % — ABNORMAL LOW (ref 39.0–48.0)
HEMOGLOBIN: 13.1 g/dL (ref 12.9–16.5)
LYMPHOCYTES ABSOLUTE COUNT: 0.7 10*9/L — ABNORMAL LOW (ref 1.1–3.6)
LYMPHOCYTES RELATIVE PERCENT: 8.5 %
MEAN CORPUSCULAR HEMOGLOBIN CONC: 34.2 g/dL (ref 32.0–36.0)
MEAN CORPUSCULAR HEMOGLOBIN: 32.2 pg (ref 25.9–32.4)
MEAN CORPUSCULAR VOLUME: 93.9 fL (ref 77.6–95.7)
MEAN PLATELET VOLUME: 7.4 fL (ref 6.8–10.7)
MONOCYTES ABSOLUTE COUNT: 0.7 10*9/L (ref 0.3–0.8)
MONOCYTES RELATIVE PERCENT: 8.7 %
NEUTROPHILS ABSOLUTE COUNT: 6.4 10*9/L (ref 1.8–7.8)
NEUTROPHILS RELATIVE PERCENT: 82.5 %
PLATELET COUNT: 362 10*9/L (ref 150–450)
RED BLOOD CELL COUNT: 4.08 10*12/L — ABNORMAL LOW (ref 4.26–5.60)
RED CELL DISTRIBUTION WIDTH: 13.6 % (ref 12.2–15.2)
WBC ADJUSTED: 7.8 10*9/L (ref 3.6–11.2)

## 2023-04-17 LAB — SLIDE REVIEW

## 2023-04-17 LAB — PHOSPHORUS
PHOSPHORUS: 9 mg/dL — ABNORMAL HIGH (ref 2.4–5.1)
PHOSPHORUS: 7.7 mg/dL — ABNORMAL HIGH (ref 2.4–5.1)
PHOSPHORUS: 12.1 mg/dL — ABNORMAL HIGH (ref 2.4–5.1)
PHOSPHORUS: 2.7 mg/dL (ref 2.4–5.1)

## 2023-04-17 MED ADMIN — potassium chloride 20 mEq in 100 mL IVPB Premix: 20 meq | INTRAVENOUS | @ 10:00:00 | Stop: 2023-04-17

## 2023-04-17 MED ADMIN — methocarbamol (ROBAXIN) 500 mg in sodium chloride (NS) 0.9 % 50 mL IVPB: 500 mg | INTRAVENOUS | @ 20:00:00

## 2023-04-17 MED ADMIN — oxyCODONE (ROXICODONE) immediate release tablet 10 mg: 10 mg | ORAL | @ 04:00:00 | Stop: 2023-04-30

## 2023-04-17 MED ADMIN — HYDROmorphone (PF) injection Syrg 0.5 mg: .5 mg | INTRAVENOUS | @ 12:00:00 | Stop: 2023-04-30

## 2023-04-17 MED ADMIN — fat emulsion 20 % with fish oil (SMOFlipid) infusion 250 mL: 250 mL | INTRAVENOUS | @ 02:00:00 | Stop: 2023-04-17

## 2023-04-17 MED ADMIN — sodium chloride (NS) 0.9 % flush 10 mL: 10 mL | INTRAVENOUS | @ 04:00:00

## 2023-04-17 MED ADMIN — acetaminophen (OFIRMEV) 10 mg/mL injection 1,000 mg: 1000 mg | INTRAVENOUS | @ 13:00:00 | Stop: 2023-04-17

## 2023-04-17 MED ADMIN — metoPROLOL (Lopressor) injection 5 mg: 5 mg | INTRAVENOUS | @ 23:00:00

## 2023-04-17 MED ADMIN — sodium chloride (NS) 0.9 % flush 10 mL: 10 mL | INTRAVENOUS | @ 15:00:00

## 2023-04-17 MED ADMIN — HYDROmorphone (PF) injection Syrg 0.5 mg: .5 mg | INTRAVENOUS | @ 16:00:00 | Stop: 2023-04-30

## 2023-04-17 MED ADMIN — pantoprazole (Protonix) injection 40 mg: 40 mg | INTRAVENOUS | @ 13:00:00

## 2023-04-17 MED ADMIN — levothyroxine (SYNTHROID) tablet 150 mcg: 150 ug | ORAL | @ 15:00:00

## 2023-04-17 MED ADMIN — ondansetron (ZOFRAN) injection 4 mg: 4 mg | INTRAVENOUS | @ 17:00:00

## 2023-04-17 MED ADMIN — HYDROmorphone (PF) injection Syrg 0.5 mg: .5 mg | INTRAVENOUS | @ 07:00:00 | Stop: 2023-04-30

## 2023-04-17 MED ADMIN — oxyCODONE (ROXICODONE) immediate release tablet 10 mg: 10 mg | ORAL | @ 10:00:00 | Stop: 2023-04-30

## 2023-04-17 MED ADMIN — hydrOXYzine (ATARAX) tablet 25 mg: 25 mg | ORAL | @ 01:00:00

## 2023-04-17 MED ADMIN — sodium chloride (NS) 0.9 % flush 10 mL: 10 mL | INTRAVENOUS | @ 20:00:00

## 2023-04-17 MED ADMIN — dextrose 5 % and sodium chloride 0.45 % infusion: 125 mL/h | INTRAVENOUS | @ 21:00:00

## 2023-04-17 MED ADMIN — acetaminophen (OFIRMEV) 10 mg/mL injection 1,000 mg: 1000 mg | INTRAVENOUS | @ 21:00:00 | Stop: 2023-04-17

## 2023-04-17 MED ADMIN — methocarbamol (ROBAXIN) 500 mg in sodium chloride (NS) 0.9 % 50 mL IVPB: 500 mg | INTRAVENOUS | @ 14:00:00

## 2023-04-17 MED ADMIN — metoPROLOL (Lopressor) injection 5 mg: 5 mg | INTRAVENOUS | @ 16:00:00

## 2023-04-17 MED ADMIN — tamsulosin (FLOMAX) 24 hr capsule 0.4 mg: .4 mg | ORAL | @ 13:00:00

## 2023-04-17 MED ADMIN — Parenteral Nutrition (CENTRAL): INTRAVENOUS | @ 02:00:00 | Stop: 2023-04-17

## 2023-04-17 MED ADMIN — potassium chloride 20 mEq in 100 mL IVPB Premix: 20 meq | INTRAVENOUS | @ 11:00:00 | Stop: 2023-04-17

## 2023-04-17 MED ADMIN — methocarbamol (ROBAXIN) 500 mg in sodium chloride (NS) 0.9 % 50 mL IVPB: 500 mg | INTRAVENOUS | @ 04:00:00

## 2023-04-17 MED ADMIN — dextrose 5 % and sodium chloride 0.45 % infusion: 125 mL/h | INTRAVENOUS | @ 06:00:00

## 2023-04-17 MED ADMIN — ondansetron (ZOFRAN) injection 4 mg: 4 mg | INTRAVENOUS | @ 04:00:00

## 2023-04-17 MED ADMIN — HYDROmorphone (PF) injection Syrg 0.5 mg: .5 mg | INTRAVENOUS | @ 20:00:00 | Stop: 2023-04-30

## 2023-04-17 MED ADMIN — metoPROLOL (Lopressor) injection 5 mg: 5 mg | INTRAVENOUS | @ 04:00:00

## 2023-04-17 MED ADMIN — enoxaparin (LOVENOX) syringe 40 mg: 40 mg | SUBCUTANEOUS | @ 04:00:00

## 2023-04-17 MED ADMIN — acetaminophen (OFIRMEV) 10 mg/mL injection 1,000 mg: 1000 mg | INTRAVENOUS | @ 04:00:00 | Stop: 2023-04-17

## 2023-04-17 MED ADMIN — HYDROmorphone (PF) (DILAUDID) injection 1 mg: 1 mg | INTRAVENOUS | @ 05:00:00 | Stop: 2023-04-17

## 2023-04-17 MED ADMIN — HYDROmorphone (PF) injection Syrg 0.5 mg: .5 mg | INTRAVENOUS | @ 02:00:00 | Stop: 2023-04-30

## 2023-04-17 MED ADMIN — metoPROLOL (Lopressor) injection 5 mg: 5 mg | INTRAVENOUS | @ 10:00:00

## 2023-04-17 MED ADMIN — prochlorperazine (COMPAZINE) injection 5 mg: 5 mg | INTRAVENOUS | @ 12:00:00

## 2023-04-17 NOTE — Unmapped (Signed)
Surgery Progress Note  1 Day [facility] Service: SRA - GI 1 [address]) [phone]  Admitting Attending: Surgical Oncology Attendings: Daryl Eastern, DO      Assessment:     Sergio Perez is a 62 y.o. 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     NGT placed for decompression, admitted to the floor. Now s/p ex lap, small bowel resection and omentectomy on 04/16/23.    Interval Events:   Tolerated surgery well. Overnight with controlled pain requiring multiple dilaudid pushes, not remembering to utilize PCA frequently. UOP adequate. G tube with bilious output, to gravity bag.    Plan:       Neuro/Pain:   *Hx of CVA   - Holding home Plavix   *Pain/Nausea   - Scheduled IV Tylenol   - Dilaudid PCA   - Will discuss addition of toradol today   - Compazine prn       CV: VSS and normotensive  *Hx of HTN, HLD  - Home metoprolol 100mg  BID held, IV metoprolol 5mg  q6hrs   - Holding home fenofibrate, atorvastatin       Pulm: NWOB and on RA      GI  - F: LR at 125 mL/hr  - E: hypokalemia, replacing K and hypomagnesemia, replacing Mg  - N: NPO with ice chips for comfort  - G tube to gravity until ROBF  - Continue TPN      Endo:   *Hx of hypothyroidism  - Resume levothyroxine, though absorption of this at this point will be questionable      Renal/GU:   - Monitor for retention, bladder scan if not voided in 6 hours   - Flomax 0.4mg  daily       Heme/ID:   - Daily CBC   - Lovenox DVT ppx      Dispo: Floor    Please page SRA - GI 1 [address], [place]) [phone] with any questions.     Attending Attestation:    I supervised the resident for the history and exam, and conducted pertinent aspects of the history and exam after the initial review. I discussed the findings, assessment and plan with the resident and agree with the findings and plan as documented in the resident???s note on the date listed above.    POD 1, s/p exploration for MBO.  Pain control, OOB, ambulate.  Slow on PO intake.  TPN.    >Functional Status on admission: Partially dependent - The patient requires some assistance from another person for activities of daily living  >ASA class on admission: ASA 4-severe systemic disease that is a constant threat to life  >Dementia on admission: No  >Impaired sensorium/Delirium on admission: No delirium  >Recent history (< 6 months) of cerebrovascular accident and neurological deficit: No   >Congestive heart failure in 30 days prior to surgery: No  >Recent history (< 6 months) of Myocardial Infarction: No   >Recent history (< 6 months) of percutaneous cardiac intervention, stenting or angina: No   >Recent history (< 6 months) of peripheral vascular disease and /or rest pain: No   >Dyspnea on admission:  No  >Current smoker within one year of admission: No  >Chronic Respiratory Failure (requires continuous home O2) on admission : No  >Ascites within 30 days prior to surgery: No  >Liver cirrhosis on admission: None  >Disseminated/Metastatic Cancer on admission: Yes  >Height on admission:   Ht Readings from Last 1 Encounters:   10/29/22 182.9 cm (6')     >BMI on admission: Body mass index is 26.55 kg/m??. BMI 25-29-Overweight  >Malnutrition on admission: Yes mild protein calorie malnutrition (some weight loss <5% in 3 months, decreased food intake over previous week)  >Systemic sepsis/system inflammatory response syndrome within 48hrs prior to surgery or present on admission: None  >Steroid or immunosuppressant use for chronic condition prior to admission: Yes  >Diabetes with or without complications: No  >Chronic Kidney Disease prior to admisison: No        No results found for: GFRNAAM, GFRAAM  >Dialysis for ESRD within 2 weeks prior to surgery: No  >Acute Renal Failure present on admission: No  Creatinine Whole Blood, POC   Date Value Ref Range Status   08/06/2022 0.9 0.8 - 1.4 mg/dL Final     Creatinine   Date Value Ref Range Status   04/17/2023 0.62 (L) 0.73 - 1.18 mg/dL Final     Increase SCr >=0.3 mg/dl within 48 hours; or  Increase SCr >=1.5 times baseline, which is known or presumed to have occurred within the prior 7 days; or  Urine volume < 0.5 ml/kg/hr for 6 hours  >Hematologic disease present on admission: None     Lab Results   Component Value Date    WBC 7.8 04/17/2023    HGB 13.1 04/17/2023    HCT 38.3 (L) 04/17/2023    PLT 362 04/17/2023        Lab Results   Component Value Date    PT 18.1 (H) 04/15/2023    INR 1.64 04/15/2023         Objective:      Vital Signs:  BP 146/96  - Pulse 113  - Temp 36.9 ??C (98.4 ??F)  - Resp 20  - Wt 88.8 kg (195 lb 12.3 oz)  - SpO2 96%  - BMI 26.55 kg/m??     Input/Output:  I/O         09/12 0701  09/13 0700 09/13 0701  09/14 0700 09/14 0701  09/15 0700    P.O. 0      I.V. (mL/kg)  1400 (15.8)     IV Piggyback  250     Total Intake 0 1650     Urine (mL/kg/hr) 480 (0.2) 1770 (0.8) 300 (1.4)    Emesis/NG output 775 500 110    Stool 0  0    Blood 4 400     Total Output(mL/kg) 1259 (12.9) 2670 (30.1) 410 (4.6)    Net -1259 -1020 -410           Urine Occurrence 0 x      Stool Occurrence 0 x  0 x    Emesis Occurrence 0 x              Physical Exam:    General: Cooperative, no distress, well appearing male  Neuro: Alert, oriented, appropriate  HEENT: Normocephalic, atraumatic  Pulmonary: Normal work of breathing, equal bilateral chest rise  Cardiovascular: Regular rate and rhythm  Abdomen: Firm, distended, tender to palpation mostly in right lower quadrant  Extremities: no peripheral edema    Labs:    Lab  Results   Component Value Date    WBC 7.8 04/17/2023    HGB 13.1 04/17/2023    HCT 38.3 (L) 04/17/2023    PLT 362 04/17/2023       Lab Results   Component Value Date    NA 138 04/17/2023    K 3.5 04/17/2023    CL 105 04/17/2023    CO2 27.0 04/17/2023    BUN 14 04/17/2023    CREATININE 0.62 (L) 04/17/2023    CALCIUM 8.7 04/17/2023    MG 1.9 04/17/2023    PHOS 9.0 (H) 04/17/2023       Microbiology Results (last day)       ** No results found for the last 24 hours. **            Imaging:   CT A/P from OSH 9/10:   -Multiple dilated loops of small bowel, with somewhat gradual transition to decompressed small bowel in the right lower quadrant. Findings raise a concern for developing partial small bowel obstruction.  -Peritoneum and omental carcinomatosis with increasing loculated appearing inferior perihepatic and perisplenic ascites.  -2.5 cm enhancing lesion in segment 6 of the liver, stable in size.

## 2023-04-17 NOTE — Unmapped (Signed)
Patient alerted and oriented; forgetful. VSS. Complains of unrelieved pain. Claims PCA dilaudlid is not enough per current dose. RN had to remind patient multiple times to press the button to make demands for pain. 2x breakthrough pain orders acquired and administered. Patient slept for 2-3 hours all night. On TPN and IVF. ~300 ml drained from PEG tube. Decent urine output.

## 2023-04-17 NOTE — Unmapped (Signed)
Pt had a hemicolectomy s/p chemo with recurrence. Has midline incision. Pt has moderate pain, on a PCA pump. No nausea at this time. Not ambulating at this time. Bed elevated to 30 degrees. PICC line to LT arm. Foley Cath 16 Fr intact. Has Port not accessed, can be if needed. On TPN. NPO with ice chips for comfort measures. Has a Carotid stent. Peg tube in place for decompression. Will resume all orders as prescribed.       Problem: Adult Inpatient Plan of Care  Goal: Plan of Care Review  Outcome: Progressing  Goal: Patient-Specific Goal (Individualized)  Outcome: Progressing  Goal: Absence of Hospital-Acquired Illness or Injury  Outcome: Progressing  Goal: Optimal Comfort and Wellbeing  Outcome: Progressing  Goal: Readiness for Transition of Care  Outcome: Progressing  Goal: Rounds/Family Conference  Outcome: Progressing     Problem: Malnutrition  Goal: Improved Nutritional Intake  Outcome: Progressing     Problem: Wound  Goal: Optimal Coping  Outcome: Progressing  Goal: Optimal Functional Ability  Outcome: Progressing  Goal: Absence of Infection Signs and Symptoms  Outcome: Progressing  Goal: Improved Oral Intake  Outcome: Progressing  Goal: Optimal Pain Control and Function  Outcome: Progressing  Goal: Skin Health and Integrity  Outcome: Progressing  Goal: Optimal Wound Healing  Outcome: Progressing     Problem: Fall Injury Risk  Goal: Absence of Fall and Fall-Related Injury  Outcome: Progressing

## 2023-04-18 LAB — CBC W/ AUTO DIFF
BASOPHILS ABSOLUTE COUNT: 0.1 10*9/L (ref 0.0–0.1)
BASOPHILS RELATIVE PERCENT: 0.7 %
EOSINOPHILS ABSOLUTE COUNT: 0.1 10*9/L (ref 0.0–0.5)
EOSINOPHILS RELATIVE PERCENT: 1.1 %
HEMATOCRIT: 34.6 % — ABNORMAL LOW (ref 39.0–48.0)
HEMOGLOBIN: 11.6 g/dL — ABNORMAL LOW (ref 12.9–16.5)
LYMPHOCYTES ABSOLUTE COUNT: 1.3 10*9/L (ref 1.1–3.6)
LYMPHOCYTES RELATIVE PERCENT: 11.9 %
MEAN CORPUSCULAR HEMOGLOBIN CONC: 33.7 g/dL (ref 32.0–36.0)
MEAN CORPUSCULAR HEMOGLOBIN: 30.4 pg (ref 25.9–32.4)
MEAN CORPUSCULAR VOLUME: 90.2 fL (ref 77.6–95.7)
MEAN PLATELET VOLUME: 8 fL (ref 6.8–10.7)
MONOCYTES ABSOLUTE COUNT: 1.5 10*9/L — ABNORMAL HIGH (ref 0.3–0.8)
MONOCYTES RELATIVE PERCENT: 14 %
NEUTROPHILS ABSOLUTE COUNT: 7.6 10*9/L (ref 1.8–7.8)
NEUTROPHILS RELATIVE PERCENT: 72.3 %
PLATELET COUNT: 344 10*9/L (ref 150–450)
RED BLOOD CELL COUNT: 3.83 10*12/L — ABNORMAL LOW (ref 4.26–5.60)
RED CELL DISTRIBUTION WIDTH: 13.7 % (ref 12.2–15.2)
WBC ADJUSTED: 10.5 10*9/L (ref 3.6–11.2)

## 2023-04-18 LAB — BASIC METABOLIC PANEL
ANION GAP: 5 mmol/L (ref 5–14)
BLOOD UREA NITROGEN: 14 mg/dL (ref 9–23)
BUN / CREAT RATIO: 25
CALCIUM: 8.5 mg/dL — ABNORMAL LOW (ref 8.7–10.4)
CHLORIDE: 106 mmol/L (ref 98–107)
CO2: 28 mmol/L (ref 20.0–31.0)
CREATININE: 0.57 mg/dL — ABNORMAL LOW
EGFR CKD-EPI (2021) MALE: >90 mL/min/{1.73_m2} (ref >=60)
GLUCOSE RANDOM: 115 mg/dL (ref 70–179)
POTASSIUM: 3.9 mmol/L (ref 3.4–4.8)
SODIUM: 139 mmol/L (ref 135–145)

## 2023-04-18 LAB — MAGNESIUM: MAGNESIUM: 1.6 mg/dL (ref 1.6–2.6)

## 2023-04-18 LAB — PHOSPHORUS: PHOSPHORUS: 1.7 mg/dL — ABNORMAL LOW (ref 2.4–5.1)

## 2023-04-18 MED ADMIN — sodium phosphate 45 mmol in dextrose 5 % 500 mL IVPB: 45 mmol | INTRAVENOUS | @ 22:00:00 | Stop: 2023-04-18

## 2023-04-18 MED ADMIN — metoPROLOL (Lopressor) injection 5 mg: 5 mg | INTRAVENOUS | @ 18:00:00

## 2023-04-18 MED ADMIN — sevelamer (RENVELA) tablet 800 mg: 800 mg | ORAL | @ 02:00:00

## 2023-04-18 MED ADMIN — HYDROmorphone (PF) injection Syrg 0.5 mg: .5 mg | INTRAVENOUS | @ 02:00:00 | Stop: 2023-04-30

## 2023-04-18 MED ADMIN — levothyroxine (SYNTHROID) tablet 150 mcg: 150 ug | ORAL | @ 12:00:00

## 2023-04-18 MED ADMIN — pantoprazole (Protonix) injection 40 mg: 40 mg | INTRAVENOUS | @ 13:00:00

## 2023-04-18 MED ADMIN — methocarbamol (ROBAXIN) 500 mg in sodium chloride (NS) 0.9 % 50 mL IVPB: 500 mg | INTRAVENOUS | @ 21:00:00

## 2023-04-18 MED ADMIN — sodium chloride (NS) 0.9 % flush 10 mL: 10 mL | INTRAVENOUS | @ 12:00:00

## 2023-04-18 MED ADMIN — oxyCODONE (ROXICODONE) immediate release tablet 10 mg: 10 mg | ORAL | @ 13:00:00 | Stop: 2023-04-30

## 2023-04-18 MED ADMIN — enoxaparin (LOVENOX) syringe 40 mg: 40 mg | SUBCUTANEOUS | @ 04:00:00

## 2023-04-18 MED ADMIN — magnesium sulfate 2gm/50mL IVPB: 2 g | INTRAVENOUS | @ 16:00:00 | Stop: 2023-04-18

## 2023-04-18 MED ADMIN — fat emulsion 20 % with fish oil (SMOFlipid) infusion 250 mL: 250 mL | INTRAVENOUS | @ 02:00:00 | Stop: 2023-04-18

## 2023-04-18 MED ADMIN — metoPROLOL (Lopressor) injection 5 mg: 5 mg | INTRAVENOUS | @ 23:00:00

## 2023-04-18 MED ADMIN — tamsulosin (FLOMAX) 24 hr capsule 0.4 mg: .4 mg | ORAL | @ 12:00:00

## 2023-04-18 MED ADMIN — hydrOXYzine (ATARAX) tablet 25 mg: 25 mg | ORAL | @ 12:00:00

## 2023-04-18 MED ADMIN — HYDROmorphone (PF) injection Syrg 0.5 mg: .5 mg | INTRAVENOUS | @ 09:00:00 | Stop: 2023-04-30

## 2023-04-18 MED ADMIN — sodium chloride (NS) 0.9 % flush 10 mL: 10 mL | INTRAVENOUS | @ 04:00:00

## 2023-04-18 MED ADMIN — hydrOXYzine (ATARAX) tablet 25 mg: 25 mg | ORAL | @ 09:00:00

## 2023-04-18 MED ADMIN — sodium chloride (NS) 0.9 % flush 10 mL: 10 mL | INTRAVENOUS | @ 20:00:00

## 2023-04-18 MED ADMIN — methocarbamol (ROBAXIN) 500 mg in sodium chloride (NS) 0.9 % 50 mL IVPB: 500 mg | INTRAVENOUS | @ 13:00:00

## 2023-04-18 MED ADMIN — magnesium sulfate 2gm/50mL IVPB: 2 g | INTRAVENOUS | @ 18:00:00 | Stop: 2023-04-18

## 2023-04-18 MED ADMIN — dextrose 5 % and sodium chloride 0.45 % infusion: 125 mL/h | INTRAVENOUS | @ 13:00:00

## 2023-04-18 MED ADMIN — metoPROLOL (Lopressor) injection 5 mg: 5 mg | INTRAVENOUS | @ 09:00:00

## 2023-04-18 MED ADMIN — metoPROLOL (Lopressor) injection 5 mg: 5 mg | INTRAVENOUS | @ 04:00:00

## 2023-04-18 MED ADMIN — Parenteral Nutrition (CENTRAL): INTRAVENOUS | @ 02:00:00 | Stop: 2023-04-18

## 2023-04-18 MED ADMIN — oxyCODONE (ROXICODONE) immediate release tablet 10 mg: 10 mg | ORAL | @ 22:00:00 | Stop: 2023-04-30

## 2023-04-18 MED ADMIN — dextrose 5 % and sodium chloride 0.45 % infusion: 125 mL/h | INTRAVENOUS | @ 04:00:00

## 2023-04-18 NOTE — Unmapped (Signed)
Patient A0x3. VSS. Complains of pain 9/10 prn pain meds administered as ordered. Patient refused IV methocarbamol x1 dose. Foley cath in place. TPN infusing. Upper left arm PICC line dressing clean/dry/intact.

## 2023-04-18 NOTE — Unmapped (Signed)
Surgery Progress Note  2 Days [facility] Service: SRA - GI 1 [address]) [phone]  Admitting Attending: Surgical Oncology Attendings: Daryl Eastern, DO      Assessment:     Sergio Perez is a 62 y.o. 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     Now s/p ex lap, small bowel resection and omentectomy on 04/16/23.    Interval Events:   NAEON. Remains tachycardic 110s, mostly related to pain. Feels the current regimen works well enough. Stood up yesterday, but has not ambulated much. Having some heart burn, but no nausea, vomiting. Foley remains in place. G tube to gravity. No flatus or BM yet.     Plan:       Neuro/Pain:   *Hx of CVA   - Holding home Plavix   *Pain/Nausea   - Scheduled IV Tylenol   - Dilaudid PCA, IV HM breakthrough, PO oxy    - Toradol 15mg  q6hr prn   - Compazine prn       CV: VSS and normotensive  *Hx of HTN, HLD  - Home metoprolol 100mg  BID held, IV metoprolol 5mg  q6hrs   - Holding home fenofibrate, atorvastatin       Pulm: NWOB and on RA      GI  - F: TF 118mL/hr   - E: hypomagnesemia, replacing Mg and hypophosphatemia, replacing phos, pharmacy recommended [facility]   - N: NPO with ice chips for comfort  - G tube to gravity until ROBF  - Continue TPN  - Tums  - Protonix       Endo:   *Hx of hypothyroidism  - Resume levothyroxine, though absorption of this at this point will be questionable      Renal/GU:   - Keep Foley for now, likely discontinue tomorrow   - Flomax 0.4mg  daily       Heme/ID:   - Daily CBC   - Lovenox DVT ppx      Dispo: Floor    Please page SRA - GI 1 [address], [place]) [phone] with any questions.     Attending Attestation:    I supervised the resident for the history and exam, and conducted pertinent aspects of the history and exam after the initial review. I discussed the findings, assessment and plan with the resident and agree with the findings and plan as documented in the resident???s note on the date listed above.          Objective:      Vital Signs:  BP 165/100  - Pulse 108  - Temp 36.5 ??C (97.7 ??F) (Oral) Comment (Src): oral - Resp 18  - Wt 88.7 kg (195 lb 8.8 oz)  - SpO2 96%  - BMI 26.52 kg/m??     Input/Output:  I/O         09/13 0701  09/14 0700 09/14 0701  09/15 0700 09/15 0701  09/16 0700    P.O.  0     I.V. (mL/kg) 1400 (15.8)  10 (0.1)    IV Piggyback 250      Total Intake 1650 0 10    Urine (  mL/kg/hr) 1770 (0.8) 1500 (0.7)     Emesis/NG output 500 560     Stool  0     Blood 400      Total Output(mL/kg) 2670 (30.1) 2060 (23.2)     Net -1020 -2060 +10           Stool Occurrence  0 x             Physical Exam:    General: Cooperative, no distress, well appearing male  Neuro: Alert, oriented, appropriate  HEENT: Normocephalic, atraumatic  Pulmonary: Normal work of breathing, equal bilateral chest rise  Cardiovascular: Regular rate and rhythm  Abdomen: Firm, distended, tender to palpation mostly in right lower quadrant  Extremities: no peripheral edema    Labs:    Lab Results   Component Value Date    WBC 10.5 04/18/2023    HGB 11.6 (L) 04/18/2023    HCT 34.6 (L) 04/18/2023    PLT 344 04/18/2023       Lab Results   Component Value Date    NA 139 04/18/2023    K 3.9 04/18/2023    CL 106 04/18/2023    CO2 28.0 04/18/2023    BUN 14 04/18/2023    CREATININE 0.57 (L) 04/18/2023    CALCIUM 8.5 (L) 04/18/2023    MG 1.6 04/18/2023    PHOS 1.7 (L) 04/18/2023       Microbiology Results (last day)       ** No results found for the last 24 hours. **            Imaging:   CT A/P from OSH 9/10:   -Multiple dilated loops of small bowel, with somewhat gradual transition to decompressed small bowel in the right lower quadrant. Findings raise a concern for developing partial small bowel obstruction.  -Peritoneum and omental carcinomatosis with increasing loculated appearing inferior perihepatic and perisplenic ascites.  -2.5 cm enhancing lesion in segment 6 of the liver, stable in size.

## 2023-04-18 NOTE — Unmapped (Signed)
Alert oriented. On room air. Occasional cough noted. With IFC intact draining well; with gastrostomy tube intact, draining well to brownish output. PICC line to the left hand double lumen. With PCA pump given amount 6.6 mg; TPN in progress. Pain meds given for lower back pain and post op pain, offered moderate relief, from 10/10 to 6/10. On NPO and can have ice chips. Spirometry given with instructions, patient able to comply.      Problem: Adult Inpatient Plan of Care  Goal: Plan of Care Review  04/18/2023 1101 by Cindie Crumbly, RN  Outcome: Progressing  04/18/2023 1059 by Cindie Crumbly, RN  Outcome: Progressing  04/18/2023 1059 by Cindie Crumbly, RN  Outcome: Progressing  Goal: Patient-Specific Goal (Individualized)  04/18/2023 1101 by Cindie Crumbly, RN  Outcome: Progressing  04/18/2023 1059 by Cindie Crumbly, RN  Outcome: Progressing  Goal: Absence of Hospital-Acquired Illness or Injury  Outcome: Progressing  Intervention: Prevent Skin Injury  Recent Flowsheet Documentation  Taken 04/18/2023 0919 by Cindie Crumbly, RN  Positioning for Skin: Supine/Back  Goal: Optimal Comfort and Wellbeing  Outcome: Progressing  Goal: Readiness for Transition of Care  Outcome: Progressing  Goal: Rounds/Family Conference  Outcome: Progressing

## 2023-04-18 NOTE — Unmapped (Signed)
Problem: Adult Inpatient Plan of Care  Goal: Absence of Hospital-Acquired Illness or Injury  Intervention: Prevent Skin Injury  Recent Flowsheet Documentation  Taken 04/17/2023 2000 by Demetrio Lapping, RN  Positioning for Skin: Supine/Back  Device Skin Pressure Protection: tubing/devices free from skin contact  Skin Protection: adhesive use limited  Intervention: Prevent and Manage VTE (Venous Thromboembolism) Risk  Recent Flowsheet Documentation  Taken 04/17/2023 2000 by Demetrio Lapping, RN  VTE Prevention/Management:   ambulation promoted   bleeding precautions maintained   anticoagulant therapy  Anti-Embolism Device Type: SCD, Knee  Anti-Embolism Intervention: On  Anti-Embolism Device Location: BLE     Problem: Wound  Goal: Skin Health and Integrity  Intervention: Optimize Skin Protection  Recent Flowsheet Documentation  Taken 04/17/2023 2354 by Demetrio Lapping, RN  Head of Bed [facility]) Positioning: HOB at 30-45 degrees  Taken 04/17/2023 2000 by Demetrio Lapping, RN  Pressure Reduction Techniques:   frequent weight shift encouraged   heels elevated off bed  Pressure Reduction Devices: pressure-redistributing mattress utilized  Skin Protection: adhesive use limited

## 2023-04-18 NOTE — Unmapped (Signed)
Pt is NPO with TPN and fluid. Lots of pain and on PRN dilaudid on time. Nausea occasionally on zofran. Need encourage to use PCA and think it doesn't help a lot. Foley for UOP. Has bowel sound, but he reported no gas and no BM. Can walk with one person assistance. Family at bedside the whole day. Mind is clear today and confused yesterday. Mid abd incision is clean and no sign of infection.

## 2023-04-19 LAB — BASIC METABOLIC PANEL
ANION GAP: 5 mmol/L (ref 5–14)
BLOOD UREA NITROGEN: 14 mg/dL (ref 9–23)
BUN / CREAT RATIO: 26
CALCIUM: 8.5 mg/dL — ABNORMAL LOW (ref 8.7–10.4)
CHLORIDE: 106 mmol/L (ref 98–107)
CO2: 27 mmol/L (ref 20.0–31.0)
CREATININE: 0.54 mg/dL — ABNORMAL LOW
EGFR CKD-EPI (2021) MALE: >90 mL/min/{1.73_m2} (ref >=60)
GLUCOSE RANDOM: 123 mg/dL (ref 70–179)
POTASSIUM: 3.5 mmol/L (ref 3.4–4.8)
SODIUM: 138 mmol/L (ref 135–145)

## 2023-04-19 LAB — PHOSPHORUS: PHOSPHORUS: 3.7 mg/dL (ref 2.4–5.1)

## 2023-04-19 LAB — MAGNESIUM: MAGNESIUM: 1.7 mg/dL (ref 1.6–2.6)

## 2023-04-19 LAB — CBC W/ AUTO DIFF
BASOPHILS ABSOLUTE COUNT: 0.1 10*9/L (ref 0.0–0.1)
BASOPHILS RELATIVE PERCENT: 1.3 %
EOSINOPHILS ABSOLUTE COUNT: 0.1 10*9/L (ref 0.0–0.5)
EOSINOPHILS RELATIVE PERCENT: 1.5 %
HEMATOCRIT: 33.7 % — ABNORMAL LOW (ref 39.0–48.0)
HEMOGLOBIN: 11.3 g/dL — ABNORMAL LOW (ref 12.9–16.5)
LYMPHOCYTES ABSOLUTE COUNT: 0.9 10*9/L — ABNORMAL LOW (ref 1.1–3.6)
LYMPHOCYTES RELATIVE PERCENT: 10.8 %
MEAN CORPUSCULAR HEMOGLOBIN CONC: 33.6 g/dL (ref 32.0–36.0)
MEAN CORPUSCULAR HEMOGLOBIN: 30.5 pg (ref 25.9–32.4)
MEAN CORPUSCULAR VOLUME: 90.6 fL (ref 77.6–95.7)
MEAN PLATELET VOLUME: 7.7 fL (ref 6.8–10.7)
MONOCYTES ABSOLUTE COUNT: 1.2 10*9/L — ABNORMAL HIGH (ref 0.3–0.8)
MONOCYTES RELATIVE PERCENT: 14.8 %
NEUTROPHILS ABSOLUTE COUNT: 5.8 10*9/L (ref 1.8–7.8)
NEUTROPHILS RELATIVE PERCENT: 71.6 %
PLATELET COUNT: 324 10*9/L (ref 150–450)
RED BLOOD CELL COUNT: 3.72 10*12/L — ABNORMAL LOW (ref 4.26–5.60)
RED CELL DISTRIBUTION WIDTH: 13.8 % (ref 12.2–15.2)
WBC ADJUSTED: 8.1 10*9/L (ref 3.6–11.2)

## 2023-04-19 MED ADMIN — acetaminophen (OFIRMEV) 10 mg/mL injection 1,000 mg: 1000 mg | INTRAVENOUS | @ 18:00:00 | Stop: 2023-04-20

## 2023-04-19 MED ADMIN — methocarbamol (ROBAXIN) 500 mg in sodium chloride (NS) 0.9 % 50 mL IVPB: 500 mg | INTRAVENOUS | @ 12:00:00

## 2023-04-19 MED ADMIN — HYDROmorphone (PF) injection Syrg 0.5 mg: .5 mg | INTRAVENOUS | @ 12:00:00 | Stop: 2023-04-30

## 2023-04-19 MED ADMIN — metoPROLOL (Lopressor) injection 5 mg: 5 mg | INTRAVENOUS | @ 10:00:00 | Stop: 2023-04-19

## 2023-04-19 MED ADMIN — magnesium sulfate 2gm/50mL IVPB: 2 g | INTRAVENOUS | @ 14:00:00 | Stop: 2023-04-19

## 2023-04-19 MED ADMIN — metoPROLOL (Lopressor) injection 5 mg: 5 mg | INTRAVENOUS | @ 15:00:00 | Stop: 2023-04-19

## 2023-04-19 MED ADMIN — enoxaparin (LOVENOX) syringe 40 mg: 40 mg | SUBCUTANEOUS | @ 04:00:00

## 2023-04-19 MED ADMIN — sodium chloride 0.45% (1/2 NS) infusion: 0-125 mL/h | INTRAVENOUS | @ 12:00:00

## 2023-04-19 MED ADMIN — HYDROmorphone (DILAUDID) 50mg/50ml (1mg/ml) PCA CADD: INTRAVENOUS | @ 13:00:00 | Stop: 2023-04-30

## 2023-04-19 MED ADMIN — potassium chloride 10 mEq in 100 mL IVPB: 10 meq | INTRAVENOUS | @ 13:00:00 | Stop: 2023-04-19

## 2023-04-19 MED ADMIN — fat emulsion 20 % with fish oil (SMOFlipid) infusion 250 mL: 250 mL | INTRAVENOUS | @ 02:00:00 | Stop: 2023-04-19

## 2023-04-19 MED ADMIN — sodium chloride (NS) 0.9 % flush 10 mL: 10 mL | INTRAVENOUS | @ 04:00:00

## 2023-04-19 MED ADMIN — ketorolac (TORADOL) injection 15 mg: 15 mg | INTRAVENOUS | @ 22:00:00 | Stop: 2023-04-24

## 2023-04-19 MED ADMIN — sodium chloride (NS) 0.9 % flush 10 mL: 10 mL | INTRAVENOUS | @ 20:00:00

## 2023-04-19 MED ADMIN — ketorolac (TORADOL) injection 15 mg: 15 mg | INTRAVENOUS | @ 15:00:00 | Stop: 2023-04-24

## 2023-04-19 MED ADMIN — sodium chloride (NS) 0.9 % flush 10 mL: 10 mL | INTRAVENOUS | @ 12:00:00

## 2023-04-19 MED ADMIN — acetaminophen (TYLENOL) tablet 1,000 mg: 1000 mg | ORAL | @ 12:00:00 | Stop: 2023-04-19

## 2023-04-19 MED ADMIN — levothyroxine (SYNTHROID) tablet 150 mcg: 150 ug | ORAL | @ 12:00:00

## 2023-04-19 MED ADMIN — oxyCODONE (ROXICODONE) 5 mg/5 mL solution 10 mg: 10 mg | ORAL | @ 15:00:00 | Stop: 2023-05-03

## 2023-04-19 MED ADMIN — oxyCODONE (ROXICODONE) 5 mg/5 mL solution 10 mg: 10 mg | ORAL | @ 20:00:00 | Stop: 2023-05-03

## 2023-04-19 MED ADMIN — lidocaine (ASPERCREME) 4 % 1 patch: 1 | TRANSDERMAL | @ 15:00:00

## 2023-04-19 MED ADMIN — methocarbamol (ROBAXIN) 500 mg in sodium chloride (NS) 0.9 % 50 mL IVPB: 500 mg | INTRAVENOUS | @ 04:00:00

## 2023-04-19 MED ADMIN — tamsulosin (FLOMAX) 24 hr capsule 0.4 mg: .4 mg | ORAL | @ 12:00:00

## 2023-04-19 MED ADMIN — methocarbamol (ROBAXIN) 500 mg in sodium chloride (NS) 0.9 % 50 mL IVPB: 500 mg | INTRAVENOUS | @ 20:00:00

## 2023-04-19 MED ADMIN — pantoprazole (Protonix) injection 40 mg: 40 mg | INTRAVENOUS | @ 12:00:00

## 2023-04-19 MED ADMIN — potassium chloride 10 mEq in 100 mL IVPB: 10 meq | INTRAVENOUS | @ 12:00:00 | Stop: 2023-04-19

## 2023-04-19 MED ADMIN — metoPROLOL (Lopressor) injection 10 mg: 10 mg | INTRAVENOUS | @ 22:00:00

## 2023-04-19 MED ADMIN — oxyCODONE (ROXICODONE) immediate release tablet 10 mg: 10 mg | ORAL | @ 09:00:00 | Stop: 2023-04-19

## 2023-04-19 MED ADMIN — Parenteral Nutrition (CENTRAL): INTRAVENOUS | @ 02:00:00 | Stop: 2023-04-19

## 2023-04-19 MED ADMIN — metoPROLOL (Lopressor) injection 5 mg: 5 mg | INTRAVENOUS | @ 04:00:00 | Stop: 2023-04-19

## 2023-04-19 NOTE — Unmapped (Signed)
Surgery Progress Note  3 Days Post-Op  Hospital Service: [facility] - GI [facility] [address] [place]) [phone]  Admitting Attending: Surgical Oncology Attendings: Daryl Eastern, DO      Assessment:     Sergio Perez is a 62 y.o. 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     Now s/p ex lap, small bowel resection and omentectomy on 04/16/23.     Interval Events:   NAEON. Remains tachycardic 110s, mostly related to pain. G tube to gravity. No flatus or BM yet.     Plan:       Neuro/Pain:   *Hx of CVA   - Holding home Plavix   *Pain/Nausea   - Scheduled IV Tylenol   - Dilaudid PCA, PRN IV HM x2, PO oxy x2   - Toradol 15mg  q6hr prn x0   - Compazine prn    - Chronic pain c/s - appreciate recs       CV: VSS and normotensive  *Hx of HTN, HLD  - Home metoprolol 100mg  BID held, IV metoprolol 5mg  q6hrs   - Holding home fenofibrate, atorvastatin       Pulm: NWOB and on RA      GI  - F: TPN  - E: replete PRN  - N: NPO with ice chips for comfort  - G tube to gravity until ROBF    - Protonix, PRN tums       Endo:   *Hx of hypothyroidism  - Resume levothyroxine, though absorption of this at this point will be questionable      Renal/GU:   - Dc foley, fu TOV  - Flomax 0.4mg  daily       Heme/ID:   - Daily CBC   - Lovenox DVT ppx      Dispo: Floor    Please page [facility] - GI 1 [address], [place]) [phone] with any questions.     Attending Attestation:    I supervised the resident for the history and exam, and conducted pertinent aspects of the history and exam after the initial review. I discussed the findings, assessment and plan with the resident and agree with the findings and plan as documented in the resident???s note on the date listed above.      Objective:      Vital Signs:  BP 169/102  - Pulse 95  - Temp 36.6 ??C (97.9 ??F) (Oral)  - Resp 18  - Wt 88.7 kg (195 lb 8.8 oz)  - SpO2 97%  - BMI 26.52 kg/m??     Input/Output:  I/O         09/14 0701  09/15 0700 09/15 0701  09/16 0700 09/16 0701  09/17 0700    P.O. 0 0 50    I.V. (mL/kg)  510 (5.7) 107 (1.2)    IV Piggyback  372     TPN  4667.5     Total Intake 0 5549.5 157    Urine (mL/kg/hr) 1500 (0.7) 1900 (0.9) 250 (1.4)    Emesis/NG output 560 2125     Stool 0 0     Blood  Total Output(mL/kg) 2060 (23.2) 4025 (45.4) 250 (2.8)    Net -2060 +1524.5 -93           Stool Occurrence 0 x 0 x     Emesis Occurrence  0 x             Physical Exam:    General: Cooperative, no distress, well appearing male  Neuro: Alert, oriented, appropriate  HEENT: Normocephalic, atraumatic  Pulmonary: Normal work of breathing, equal bilateral chest rise  Cardiovascular: Regular rate and rhythm  Abdomen: Firm, distended, tender to palpation mostly in right lower quadrant  Extremities: no peripheral edema    Labs:    Lab Results   Component Value Date    WBC 8.1 04/19/2023    HGB 11.3 (L) 04/19/2023    HCT 33.7 (L) 04/19/2023    PLT 324 04/19/2023       Lab Results   Component Value Date    NA 138 04/19/2023    K 3.5 04/19/2023    CL 106 04/19/2023    CO2 27.0 04/19/2023    BUN 14 04/19/2023    CREATININE 0.54 (L) 04/19/2023    CALCIUM 8.5 (L) 04/19/2023    MG 1.7 04/19/2023    PHOS 3.7 04/19/2023       Microbiology Results (last day)       ** No results found for the last 24 hours. **            Imaging:   CT A/P from OSH 9/10:   -Multiple dilated loops of small bowel, with somewhat gradual transition to decompressed small bowel in the right lower quadrant. Findings raise a concern for developing partial small bowel obstruction.  -Peritoneum and omental carcinomatosis with increasing loculated appearing inferior perihepatic and perisplenic ascites.  -2.5 cm enhancing lesion in segment 6 of the liver, stable in size.      Sidonie Dickens, MD  Surgical Oncology, PGY-1  Pager: [phone]

## 2023-04-19 NOTE — Unmapped (Signed)
Pt A&Ox4. VSS. Pain managed with scheduled/PRN meds and PCA pump. Meds admin as ordered. TPN running. TFV at 125. Foley in place. PICC in place. No outstanding needs at this time. Continuing planned care.     Problem: Adult Inpatient Plan of Care  Goal: Plan of Care Review  Outcome: Progressing  Goal: Patient-Specific Goal (Individualized)  Outcome: Progressing  Goal: Absence of Hospital-Acquired Illness or Injury  Outcome: Progressing  Goal: Optimal Comfort and Wellbeing  Outcome: Progressing  Goal: Readiness for Transition of Care  Outcome: Progressing  Goal: Rounds/Family Conference  Outcome: Progressing     Problem: Malnutrition  Goal: Improved Nutritional Intake  Outcome: Progressing     Problem: Wound  Goal: Optimal Coping  Outcome: Progressing  Goal: Optimal Functional Ability  Outcome: Progressing  Goal: Absence of Infection Signs and Symptoms  Outcome: Progressing  Goal: Improved Oral Intake  Outcome: Progressing  Goal: Optimal Pain Control and Function  Outcome: Progressing  Goal: Skin Health and Integrity  Outcome: Progressing  Goal: Optimal Wound Healing  Outcome: Progressing     Problem: Fall Injury Risk  Goal: Absence of Fall and Fall-Related Injury  Outcome: Progressing

## 2023-04-19 NOTE — Unmapped (Signed)
=  Department of Anesthesiology  Pain Medicine Division    Chronic Pain Consult Note      Requesting Attending Physician:  Lynnell Dike, DO  Service Requesting Consult:  Sur Oncology [facility])    Assessment/Recommendations:  The patient was seen in consultation on request of Lynnell Dike, DO regarding assistance with pain management. The patient is obtaining adequate pain relief on current medication regimen.     The patient is a 62 y.o. 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      Now s/p ex lap, small bowel resection and omentectomy on 04/16/23.     Home regimen:    Oxycodone 5 mg every 6 hours as needed    Recommendations:  -The chronic pain service is a consult service and does not place orders, just makes recommendations (except ketamine and lidocaine infusions)  -Please evaluate all patients on opioids for appropriateness of prescribing narcan at discharge.  The chronic pain service can assist with this.  Nasal narcan covered by most insurance.  -Recommendations given apply to the current hospitalization and do not reflect long term recommendations.    -We recommend  -To discontinue Dilaudid PCA  -Continue Dilaudid 0.5 mg every 4 hours as needed  -Continue oxycodone liquid 5 mg - 10 mg every 4 hours as needed.  Per PDMP last fill was 04/06/23 for 5 mg tabs 90 tabs. Please prescribe enough to cover until seen by outpatient provider.   -Can add NSAIDs when okayed by primary team    We will sign off on this patient at this time.  Please reach out to [place] with any questions or concerns    Prescribe naloxone at discharge.  Nasal narcan for most insured (Nasal narcan 4mg /actuation, prescribe 1 kit, instructions at [email or web address]).  For uninsured, chronic pain can work to assist in finding an option.  OTC nasal narcan now available at most pharmacies for around $45.    History of Present Illness:    Reason for Consult: Acute postoperative pain, chronic pain syndrome, history of colon cancer    When we went to see him today patient says that his pain is doing better with his oral pain medication.  Patient thought that he has been using Dilaudid PCA however he has not had any dose delivered to him since 11 AM earlier today.  He saw him at around 4 PM.  We discussed with the patient about discontinuing his PCA pump.  Patient is agreeable with the same.  Has no other new pain.    Pt had 1st BM today. No abd distension. No vomiting. No fever.     Prior to admission, the patient was on home opiate medications.  Hehas been followed by a pain clinic.  The [place] CSRS was reviewed.    Allergies    Allergies   Allergen Reactions    Hydrocodone Hives       Home Medications    Medications Prior to Admission   Medication Sig Dispense Refill Last Dose    acetaminophen (TYLENOL) 500 MG tablet Take 2 tablets (1,000 mg total) by mouth every twelve (12) hours as needed  for pain.       amitriptyline (ELAVIL) 100 MG tablet Take 1 tablet (100 mg total) by mouth nightly. Frequency:QHSPRN   Dosage:50   MG  Instructions:  Note:Dose: 50MG        amLODIPine (NORVASC) 10 MG tablet        atorvastatin (LIPITOR) 40 MG tablet Take 1 tablet (40 mg total) by mouth daily.       clopidogreL (PLAVIX) 75 mg tablet clopidogrel 75 mg tablet   TAKE 1 TABLET BY MOUTH ONCE DAILY       diphenoxylate-atropine (LOMOTIL) 2.5-0.025 mg per tablet Take 1 tablet by mouth 4 times daily 30 tablet 0     doxycycline (MONODOX) 100 MG capsule Take 1 capsule (100 mg total) by mouth two (2) times a day. 60 capsule 5     fenofibrate (LOFIBRA) 160 MG tablet Take 1 tablet (160 mg total) by mouth daily.       fluoride, sodium, (DENTAGEL) 1.1 % Gel Apply 1 application topically two (2) times a day. 56 g 7     gabapentin (NEURONTIN) 400 MG capsule 1 in the AM; 1 at lunch and 3 at bedtime       levothyroxine (SYNTHROID, LEVOTHROID) 150 MCG tablet Take 1 tablet (150 mcg total) by mouth daily. 30 tablet 3     loperamide (IMODIUM A-D) 2 mg tablet Take 2 tablets (4 mg) by mouth for first loose stool followed by 1 tablet (2 mg) after each loose stool thereafter (maximum of 8 tablets per day). 30 tablet 11     magnesium oxide (MAG-OX) 400 mg (241.3 mg elemental magnesium) tablet Take 1 tablet (400 mg total) by mouth two (2) times a day. 60 tablet 11     metoprolol tartrate (LOPRESSOR) 100 MG tablet 1 tablet (100 mg total) two (2) times a day.       MITIGARE 0.6 mg cap capsule        ondansetron (ZOFRAN) 8 MG tablet Take 1 tablet (8 mg total) by mouth every eight (8) hours as needed for nausea. Do not take on day 1 of chemotherapy cycles. 60 tablet 3     oxyCODONE (ROXICODONE) 5 MG immediate release tablet Take 1 tablet (5 mg total) by mouth every six (6) hours as needed for pain. 90 tablet 0     pantoprazole (PROTONIX) 40 MG tablet Take 1 tablet (40 mg total) by mouth daily.       prochlorperazine (COMPAZINE) 10 MG tablet Take 1 tablet (10 mg total) by mouth every six (6) hours as needed (Nausea & Vomiting). 60 tablet 3     trifluridine-tipiracil (LONSURF) 15-6.14 mg tablet Take 5 tablets (75 mg total) by mouth in the morning and 5 tablets (75 mg total) in the evening. Take with meals. Take for 5 days on, 9 days off for every 14 day cycle. 100 tablet 6        Inpatient Medications  Current Facility-Administered Medications   Medication Dose Route Frequency Provider Last Rate Last Admin    acetaminophen (OFIRMEV) 10 mg/mL injection 1,000 mg  1,000 mg Intravenous [facility] Algie Coffer, Esha R, MD   Stopped at 04/19/23 1348    amitriptyline (ELAVIL) tablet 100 mg 100 mg Oral Nightly Harris, Lurline Del, FNP        calcium carbonate (TUMS) chewable tablet 200 mg elem calcium  200 mg elem calcium Oral TID PRN Karna Christmas, MD        enoxaparin (LOVENOX) syringe 40 mg  40  mg Subcutaneous Q24H Emilio Aspen, MD   40 mg at 04/19/23 0001    Parenteral Nutrition (CENTRAL)   Intravenous Continuous Tiburcio Pea Lurline Del, FNP        And    fat emulsion 20 % with fish oil (SMOFlipid) infusion 250 mL  250 mL Intravenous Continuous Harris, Lurline Del, FNP        HYDROmorphone (DILAUDID) 50mg /58ml (1mg /ml) PCA CADD   Intravenous Continuous Emilio Aspen, MD   New Syringe/Cartridge at 04/19/23 0914    HYDROmorphone (PF) injection Syrg 0.5 mg  0.5 mg Intravenous Q4H PRN Algie Coffer, Esha R, MD   0.5 mg at 04/19/23 0827    hydrOXYzine (ATARAX) tablet 25 mg  25 mg Oral Q4H PRN Karna Christmas, MD   25 mg at 04/18/23 0819    ketorolac (TORADOL) injection 15 mg  15 mg Intravenous Q6H [facility] Sidonie Dickens R, MD   15 mg at 04/19/23 1114    levothyroxine (SYNTHROID) tablet 150 mcg  150 mcg Oral Daily Pricilla Larsson E, MD   150 mcg at 04/19/23 0740    lidocaine (ASPERCREME) 4 % 1 patch  1 patch Transdermal Daily Sidonie Dickens R, MD   1 patch at 04/19/23 1117    methocarbamol (ROBAXIN) 500 mg in sodium chloride (NS) 0.9 % 50 mL IVPB  500 mg Intravenous Q8H Emilio Aspen, MD   Stopped at 04/19/23 1639    metoPROLOL (Lopressor) injection 10 mg  10 mg Intravenous Q6H [facility] Harris, Lurline Del, FNP        naloxone [facility]) injection 0.4 mg  0.4 mg Intravenous Q5 Min PRN Emilio Aspen, MD        ondansetron [facility]) injection 4 mg  4 mg Intravenous Q6H PRN Emilio Aspen, MD   4 mg at 04/17/23 1311    oxyCODONE (ROXICODONE) 5 mg/5 mL solution 5 mg  5 mg Oral Q4H PRN Algie Coffer, Esha R, MD        Or    oxyCODONE (ROXICODONE) 5 mg/5 mL solution 10 mg  10 mg Oral Q4H PRN Algie Coffer, Esha R, MD   10 mg at 04/19/23 1606    pantoprazole (Protonix) injection 40 mg  40 mg Intravenous Daily Emilio Aspen, MD   40 mg at 04/19/23 0740    Parenteral Nutrition (CENTRAL)   Intravenous Continuous Karna Christmas, MD 60 mL/hr at 04/18/23 2206 New Bag at 04/18/23 2206    prochlorperazine (COMPAZINE) injection 5 mg  5 mg Intravenous Q6H PRN Emilio Aspen, MD   5 mg at 04/17/23 0800    sodium chloride (NS) 0.9 % flush 10 mL  10 mL Intravenous Q8H Emilio Aspen, MD   10 mL at 04/19/23 1606    sodium chloride (NS) 0.9 % flush 10 mL  10 mL Intravenous Q8H Emilio Aspen, MD   10 mL at 04/19/23 1606    sodium chloride 0.45% (1/2 NS) infusion  0-125 mL/hr Intravenous Continuous Kirke Shaggy, FNP 65 mL/hr at 04/19/23 0820 65 mL/hr at 04/19/23 0820    tamsulosin (FLOMAX) 24 hr capsule 0.4 mg  0.4 mg Oral Daily Emilio Aspen, MD   0.4 mg at 04/19/23 0740         Past Medical History    Past Medical History:   Diagnosis Date    Hypothyroidism     Squamous cell carcinoma of base of tongue (CMS-HCC) 11/2008           Past Surgical History  Past Surgical History:   Procedure Laterality Date    chemoradiotherapy  2010    IR INSERT PORT AGE GREATER THAN 5 YRS  08/19/2022    IR INSERT PORT AGE GREATER THAN 5 YRS 08/19/2022 Evron, Gerri Lins, PA IMG VIR [facility]    laser resection      NECK DISSECTION      PR EXPLORATORY OF ABDOMEN N/A 04/16/2023    Procedure: EXPLORATORY LAPAROTOMY, EXPLORATORY CELIOTOMY WITH OR WITHOUT BIOPSY(S);  Surgeon: Lynnell Dike, DO;  Location: OR [facility];  Service: Surgical Oncology    PR REMOVAL OF OMENTUM N/A 04/16/2023    Procedure: OMENTECTOMY, EPIPLOECTOMY, RESECTION OF OMENTUM;  Surgeon: Lynnell Dike, DO;  Location: OR [facility];  Service: Surgical Oncology    PR RESECT SMALL INTEST,SINGL RESEC/ANAS N/A 04/16/2023    Procedure: ENTERECTOMY SM INTES; SNGL RESECT & ANASTOM;  Surgeon: Lynnell Dike, DO;  Location: OR [facility];  Service: Surgical Oncology           Family History    Family History   Problem Relation Age of Onset    Cancer Brother Brain         Social History:  Social History     Tobacco Use   Smoking Status Former    Current packs/day: 0.00    Average packs/day: 1 pack/day for 7.0 years (7.0 ttl pk-yrs)    Types: Cigarettes    Start date: 05/16/2003    Quit date: 05/15/2010    Years since quitting: 12.9   Smokeless Tobacco Never     Social History     Substance and Sexual Activity   Alcohol Use Yes     Social History     Substance and Sexual Activity   Drug Use Yes    Types: Marijuana         Lab Results   Component Value Date    CREATININE 0.54 (L) 04/19/2023       Lab Results   Component Value Date    ALKPHOS 60 04/16/2023    BILITOT 0.2 (L) 04/16/2023    BILIDIR 0.10 04/16/2023    PROT 6.8 04/16/2023    ALBUMIN 3.2 (L) 04/16/2023    ALT 9 (L) 04/16/2023    AST 14 04/16/2023       Encounter Date: 04/14/23   ECG 12 Lead   Result Value    EKG Systolic BP     EKG Diastolic BP     EKG Ventricular Rate 130    EKG Atrial Rate 130    EKG P-R Interval 134    EKG QRS Duration 84    EKG Q-T Interval 314    EKG QTC Calculation 462    EKG Calculated P Axis 14    EKG Calculated R Axis 12    EKG Calculated T Axis 31    QTC Fredericia 406    Narrative    SINUS TACHYCARDIA  OTHERWISE NORMAL ECG  NO PREVIOUS ECGS AVAILABLE  Confirmed by Mariane Baumgarten ([id number]) on 04/16/2023 9:48:15 PM       No results found for requested labs within last 30 days.       Objective:     Vital Signs  Temp:  [36.5 ??C (97.7 ??F)-36.8 ??C (98.2 ??F)] 36.5 ??C (97.7 ??F)  Heart Rate:  [65-112] 89  Resp:  [17-18] 17  BP: (133-169)/(90-104) 143/104  MAP (mmHg):  [106-120] 118  SpO2:  [84 %-98 %] 95 %    Physical Exam    GENERAL:  Well developed, well-nourished male and is in no apparent distress.   HEAD/NECK:    Reveals normocephalic/atraumatic.   CARDIOVASCULAR:   Regular rate  LUNGS:   Normal work of breathing, no supplemental 02  EXTREMITIES:  Warm, no clubbing, cyanosis, or edema was noted.  NEUROLOGIC:    The patient was alert and oriented times four with normal language, attention, cognition and memory. Cranial nerve exam was grossly normal.   MUSCULOSKELETAL:    Patient ambulatory.  SKIN:  No obvious rashes lesions or erythema  PSY:  Appropriate affect and mood.      Problem List    Malignant bowel obstruction  Chronic pain syndrome

## 2023-04-19 NOTE — Unmapped (Signed)
PHYSICAL THERAPY  Evaluation (04/19/23 0930)          Patient Name:  Sergio Perez       Medical Record Number: [id number]   Date of Birth: [birth year]  Sex: Male        Post-Discharge Physical Therapy Recommendations:  PT Post Acute Discharge Recommendations: Skilled PT services indicated, 3x weekly   Equipment Recommendation  PT DME Recommendations: Rollator          Treatment Diagnosis: Unsteadiness on feet        Activity Tolerance: Limited by fatigue     ASSESSMENT  Problem List: Gait deviation, Fall risk, Impaired balance, Decreased strength, Decreased endurance, Decreased mobility      Assessment : Pt. is deconditioned following his recent inactivity post op but is expected to steadily improve. He is motivated to participate and mobilize and reports that his ex-wife is willing to support him upon dc.   He was added to the rehab mob. team to facilitate more frequent walks in the hallway.  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    S/p Ex-lap, ileocolic anastomosis resection, new ileocolic side-to-side anastamosis, omentectomy, 24 Fr maloncot G tube     After a review of the personal factors, comorbidities, clinical presentation, and examination of the number of affected body systems, the patient presents as a mod. complexity case.       Today's Interventions: eval, mob. trg, positioning, posture educ                 Clinical Decision Making: Moderate        PLAN  Planned Frequency of Treatment: Plan of Care Initiated: 04/19/23  1-2x per day for: 2-3x week        Planned Interventions: Therapeutic Activity, Education (Patient/Family/Caregiver), Gait training, Neuromuscular re-education, Self-care / Home Management training, Therapeutic Exercise, Home exercise program     Goals:   Patient and Family Goals: walk     SHORT GOAL #1: will ambulate 600' w/ device - indep.               Time Frame : 1 week                                                                               Long Term Goal #1: return to PLOF in 3 weeks  Time Frame: 3 weeks     Prognosis:  Good  Positive Indicators: very willing to participate  Barriers to Discharge: Functional strength deficits, Endurance deficits, Gait instability     SUBJECTIVE  Communication Preference: Verbal,     Patient reports: agreeable to PT        Prior Functional Status: indep., no device -    no falls  Equipment available at home: None        Past Medical History:   Diagnosis Date    Hypothyroidism     Squamous cell carcinoma of base of tongue (CMS-HCC) 11/2008            Social History     Tobacco Use  Smoking status: Former     Current packs/day: 0.00     Average packs/day: 1 pack/day for 7.0 years (7.0 ttl pk-yrs)     Types: Cigarettes     Start date: 05/16/2003     Quit date: 05/15/2010     Years since quitting: 12.9    Smokeless tobacco: Never   Substance Use Topics    Alcohol use: Yes       Past Surgical History:   Procedure Laterality Date    chemoradiotherapy  2010    IR INSERT PORT AGE GREATER THAN 5 YRS  08/19/2022    IR INSERT PORT AGE GREATER THAN 5 YRS 08/19/2022 Evron, Gerri Lins, PA IMG VIR [facility]    laser resection      NECK DISSECTION      PR EXPLORATORY OF ABDOMEN N/A 04/16/2023    Procedure: EXPLORATORY LAPAROTOMY, EXPLORATORY CELIOTOMY WITH OR WITHOUT BIOPSY(S);  Surgeon: Lynnell Dike, DO;  Location: OR [facility];  Service: Surgical Oncology    PR REMOVAL OF OMENTUM N/A 04/16/2023    Procedure: OMENTECTOMY, EPIPLOECTOMY, RESECTION OF OMENTUM;  Surgeon: Lynnell Dike, DO;  Location: OR [facility];  Service: Surgical Oncology    PR RESECT SMALL INTEST,SINGL RESEC/ANAS N/A 04/16/2023    Procedure: ENTERECTOMY SM INTES; SNGL RESECT & ANASTOM;  Surgeon: Lynnell Dike, DO;  Location: OR [facility];  Service: Surgical Oncology             Family History   Problem Relation Age of Onset    Cancer Brother Brain        Allergies: Hydrocodone                  Objective Findings  Precautions / Restrictions  Precautions: Falls precautions  Weight Bearing Status: Non-applicable  Required Braces or Orthoses: Non-applicable     Pain Comments: 5/10 - RN aware     Equipment / Environment: Vascular access (PIV, TLC, Port-a-cath, PICC), Urostomy     Vitals/Orthostatics : vss     Living Situation  Living Environment: Trailer/Mobile home  Lives With: Other (ex-wife will be staying w/ him as long as possible post dc)  Home Living: One level home, Stairs to enter with rails  Rail placement (outside): Rail on right side  Number of Stairs to Enter (outside): 4  Caregiver Identified?: Yes  Caregiver Availability: 24 hours  Caregiver Ability: Limited lifting      Cognition: WFL  Orientation: Oriented x4  Visual/Perception: Within Functional Limits  Hearing: No deficit identified     Skin Inspection: Intact where visualized     Upper Extremities  UE ROM: Left WFL, Right WFL    Lower Extremities  LE ROM: Right WFL, Left WFL  LE Strength: Right Impaired/Limited, Left Impaired/Limited  RLE Strength Impairment: Reduced strength  LLE Strength Impairment: Reduced strength          Sensation: WFL  Posture: Impaired    Static Sitting-Level of Assistance: Independent    Static Standing-Level of Assistance: Stand by assistance      Bed Mobility comments: supine to sit - indep.     Transfers: Sit to Stand  Sit to Stand assistance level: Standby assist, set-up cues, supervision of patient - no hands on  Transfer comments: w/o device      Gait Level of Assistance: Standby assist, set-up cues, supervision of patient - no hands on  Gait Assistive Device: Four wheel walker  Gait Distance Ambulated (ft): 500 ft  Endurance: good    Patient at end of session: Lines intact, In chair, All needs in reach    Physical Therapy Session Duration  PT Individual [mins]: 45          AM-PAC-6 click  Help currently need turning over In bed?: None - Modified Independent/Independent  Help currently needed sitting down/standing up from chair with arms? : A Little - Minimal/Contact Guard Assist/Supervision  Help currently needed moving from supine to sitting on edge of bed?: None - Modified Independent/Independent  Help currently needed moving to and from bed from wheelchair?: A Little - Minimal/Contact Guard Assist/Supervision  Help currently needed walking in a hospital room?: A Little - Minimal/Contact Guard Assist/Supervision  Help currently needed climbing 3-5 steps with railing?: A Little - Minimal/Contact Guard Assist/Supervision    Basic Mobility Score 6 click: 20    6 click Score (in points): % of Functional Impairment, Limitation, Restriction  6: 100% impaired, limited, restricted  7-8: At least 80%, but less than 100% impaired, limited restricted  9-13: At least 60%, but less than 80% impaired, limited restricted  14-19: At least 40%, but less than 60% impaired, limited restricted  20-22: At least 20%, but less than 40% impaired, limited restricted  23: At least 1%, but less than 20% impaired, limited restricted  24: 0% impaired, limited restricted        I attest that I have reviewed the above information.  SignedArdeth Perfect, PT  Filed 04/19/2023

## 2023-04-19 NOTE — Unmapped (Signed)
Sergio Perez is a 62 y.o. male with history of *** that was admitted to the hospital on 04/14/2023 for ***. The patient was taken to the OR on 04/16/2023 for a ***. Intraoperatively ***.  He tolerated the procedure well, was extubated in the OR, and was taken to the PACU where he received routine postoperative care before being transferred to the floor.     He did well postoperatively. His diet was slowly advanced, and at the time of discharge he was tolerating a ***regular diet. The patient was able to void spontaneously, have his pain controlled with P.O. pain medication, and ambulate with minimal assistance. he was having ***regular bowel movements.     He is being discharged on 04/19/23 (POD ***) to home*** in stable condition with planned outpatient follow-up.

## 2023-04-19 NOTE — Unmapped (Signed)
Problem: Adult Inpatient Plan of Care  Goal: Plan of Care Review  Outcome: Progressing  Goal: Patient-Specific Goal (Individualized)  Outcome: Progressing  Goal: Absence of Hospital-Acquired Illness or Injury  Outcome: Progressing  Intervention: Identify and Manage Fall Risk  Recent Flowsheet Documentation  Taken 04/19/2023 0749 by Gerrianne Scale, RN  Safety Interventions:   fall reduction program maintained   low bed  Intervention: Prevent Skin Injury  Recent Flowsheet Documentation  Taken 04/19/2023 0749 by Gerrianne Scale, RN  Positioning for Skin: Supine/Back  Intervention: Prevent and Manage VTE (Venous Thromboembolism) Risk  Recent Flowsheet Documentation  Taken 04/19/2023 0749 by Gerrianne Scale, RN  VTE Prevention/Management:   ambulation promoted   anticoagulant therapy  Goal: Optimal Comfort and Wellbeing  Outcome: Progressing  Goal: Readiness for Transition of Care  Outcome: Progressing  Goal: Rounds/Family Conference  Outcome: Progressing     Problem: Malnutrition  Goal: Improved Nutritional Intake  Outcome: Progressing     Problem: Wound  Goal: Optimal Coping  Outcome: Progressing  Goal: Optimal Functional Ability  Outcome: Progressing  Goal: Absence of Infection Signs and Symptoms  Outcome: Progressing  Goal: Improved Oral Intake  Outcome: Progressing  Goal: Optimal Pain Control and Function  Outcome: Progressing  Goal: Skin Health and Integrity  Outcome: Progressing  Intervention: Optimize Skin Protection  Recent Flowsheet Documentation  Taken 04/19/2023 0749 by Gerrianne Scale, RN  Pressure Reduction Techniques: frequent weight shift encouraged  Pressure Reduction Devices: pressure-redistributing mattress utilized  Goal: Optimal Wound Healing  Outcome: Progressing     Problem: Fall Injury Risk  Goal: Absence of Fall and Fall-Related Injury  Outcome: Progressing  Intervention: Promote Scientist, clinical (histocompatibility and immunogenetics) Documentation  Taken 04/19/2023 0749 by Gerrianne Scale, RN  Safety Interventions: fall reduction program maintained   low bed     Problem: Nausea and Vomiting  Goal: Nausea and Vomiting Relief  Outcome: Progressing

## 2023-04-19 NOTE — Unmapped (Addendum)
Case Management Brief Assessment      General:  Care Manager assessed the patient by : In person interview with patient, Medical record review, Discussion with Clinical Care team    Extended Emergency Contact Information  Primary Emergency Contact: Sergio Perez,TAMMY  Home Phone: [phone]  Relation: Friend  Secondary Emergency Contact: SergioFranz Ray Sr.  Mobile Phone: [phone]  Relation: Father      Discharge Needs:  tube feed supplies?       Discharge Plan: recs are 3 times/week, possible for home health at DC       Estimated Discharge Date: 04/21/2023      Initial Assessment complete?: Yes        Additional Information:    HCDM (patient stated preference): Sergio Perez - Sister - [phone]    HCDM, back-up (If primary HCDM is unavailable): Sergio Ray Sr. - Father - [phone]    Social Determinants of Health     Financial Resource Strain: Low Risk  (12/15/2021)    Overall Financial Resource Strain (CARDIA)     Difficulty of Paying Living Expenses: Not hard at all   Internet Connectivity: Internet connectivity concern identified (12/15/2021)    Internet Connectivity     Do you have access to internet services: No     How do you connect to the internet: Personal Device at home     Is your internet connection strong enough for you to watch video on your device without major problems?: Not on file     Do you have enough data to get through the month?: Not on file     Does at least one of the devices have a camera that you can use for video chat?: Not on file   Food Insecurity: No Food Insecurity (10/06/2022)    Received from [facility], [facility]    Hunger Vital Sign     Worried About Running Out of Food in the Last Year: Never true     Ran Out of Food in the Last Year: Never true   Tobacco Use: Medium Risk (04/19/2023)    Patient History     Smoking Tobacco Use: Former     Smokeless Tobacco Use: Never     Passive Exposure: Not on file   Housing/Utilities: Low Risk  (12/15/2021)    Housing/Utilities Within the past 12 months, have you ever stayed: outside, in a car, in a tent, in an overnight shelter, or temporarily in someone else's home (i.e. couch-surfing)?: No     Are you worried about losing your housing?: No     Within the past 12 months, have you been unable to get utilities (heat, electricity) when it was really needed?: No   Alcohol Use: Not on file   Transportation Needs: No Transportation Needs (10/06/2022)    Received from [facility], [facility]    [facility] - Transportation     Lack of Transportation (Medical): No     Lack of Transportation (Non-Medical): No   Substance Use: Not on file   Health Literacy: Low Risk  (12/15/2021)    Health Literacy     : Never   Physical Activity: Not on file   Interpersonal Safety: Unknown (04/19/2023)    Interpersonal Safety     Unsafe Where You Currently Live: Not on file     Physically Hurt by Anyone: Not on file     Abused by Anyone: Not on file   Stress: Not on file   Intimate Partner Violence: Not At Risk (  09/28/2022)    Received from [facility], [facility]    Humiliation, Afraid, Rape, and Kick questionnaire     Fear of Current or Ex-Partner: No     Emotionally Abused: No     Physically Abused: No     Sexually Abused: No   Depression: Not on file   Social Connections: Not on file       Predictive Model Details          17% (Medium)  Factor Value    Calculated 04/19/2023 12:03 19% Number of active inpatient medication orders 37    [place] Risk of Unplanned Readmission Model 15% Active NSAID inpatient medication order present     8% Diagnosis of cancer present     8% Charlson Comorbidity Index 9     7% ECG/EKG order present in last 6 months     7% Latest calcium low (8.5 mg/dL)     6% Diagnosis of electrolyte disorder present     5% Imaging order present in last 6 months     5% Latest hemoglobin low (11.3 g/dL)     5% Phosphorous result present     4% Number of ED visits in last six months 1     4% Age 62     4% Current length of stay 4.833 days     3% Active anticoagulant inpatient medication order present     1% Future appointment scheduled     1% Active ulcer inpatient medication order present

## 2023-04-19 NOTE — Unmapped (Signed)
[facility]   Care Management     Patient is a 62 y.o. admitted on 04/14/2023 for Generalized abdominal pain [R10.84]. Per review of the medical record and discussion with the treating team, the patient does not meet indicators for a full assessment at this time. CM will continue to assess for discharge needs and follow up, as indicated.    Amado Coe, RN April 19, 2023 8:12 AM      Food Insecurity: No Food Insecurity (04/19/2023)    Received from [facility], [facility]    Hunger Vital Sign     Worried About Running Out of Food in the Last Year: Never true     Ran Out of Food in the Last Year: Never true      Financial Resource Strain: Low Risk  (04/19/2023)    Overall Financial Resource Strain (CARDIA)     Difficulty of Paying Living Expenses: Not hard at all     Housing/Utilities: Low Risk  (04/18/2023)    Housing/Utilities     Within the past 12 months, have you ever stayed: outside, in a car, in a tent, in an overnight shelter, or temporarily in someone else's home (i.e. couch-surfing)?: No     Are you worried about losing your housing?: No     Within the past 12 months, have you been unable to get utilities (heat, electricity) when it was really needed?: No     Transportation Needs: No Transportation Needs (04/19/2023)    Received from [facility], [facility]    [facility] - Transportation     Lack of Transportation (Medical): No     Lack of Transportation (Non-Medical): No

## 2023-04-20 LAB — BASIC METABOLIC PANEL
ANION GAP: 6 mmol/L (ref 5–14)
BLOOD UREA NITROGEN: 18 mg/dL (ref 9–23)
BUN / CREAT RATIO: 32
CALCIUM: 8.8 mg/dL (ref 8.7–10.4)
CHLORIDE: 105 mmol/L (ref 98–107)
CO2: 25 mmol/L (ref 20.0–31.0)
CREATININE: 0.57 mg/dL — ABNORMAL LOW
EGFR CKD-EPI (2021) MALE: >90 mL/min/{1.73_m2} (ref >=60)
GLUCOSE RANDOM: 107 mg/dL (ref 70–179)
POTASSIUM: 3.7 mmol/L (ref 3.4–4.8)
SODIUM: 136 mmol/L (ref 135–145)

## 2023-04-20 LAB — CBC W/ AUTO DIFF
BASOPHILS ABSOLUTE COUNT: 0.1 10*9/L (ref 0.0–0.1)
BASOPHILS RELATIVE PERCENT: 0.8 %
EOSINOPHILS ABSOLUTE COUNT: 0.1 10*9/L (ref 0.0–0.5)
EOSINOPHILS RELATIVE PERCENT: 1.4 %
HEMATOCRIT: 32.1 % — ABNORMAL LOW (ref 39.0–48.0)
HEMOGLOBIN: 10.6 g/dL — ABNORMAL LOW (ref 12.9–16.5)
LYMPHOCYTES ABSOLUTE COUNT: 0.9 10*9/L — ABNORMAL LOW (ref 1.1–3.6)
LYMPHOCYTES RELATIVE PERCENT: 14.5 %
MEAN CORPUSCULAR HEMOGLOBIN CONC: 33 g/dL (ref 32.0–36.0)
MEAN CORPUSCULAR HEMOGLOBIN: 29.8 pg (ref 25.9–32.4)
MEAN CORPUSCULAR VOLUME: 90.5 fL (ref 77.6–95.7)
MEAN PLATELET VOLUME: 7.3 fL (ref 6.8–10.7)
MONOCYTES ABSOLUTE COUNT: 0.8 10*9/L (ref 0.3–0.8)
MONOCYTES RELATIVE PERCENT: 13.7 %
NEUTROPHILS ABSOLUTE COUNT: 4.2 10*9/L (ref 1.8–7.8)
NEUTROPHILS RELATIVE PERCENT: 69.6 %
PLATELET COUNT: 313 10*9/L (ref 150–450)
RED BLOOD CELL COUNT: 3.54 10*12/L — ABNORMAL LOW (ref 4.26–5.60)
RED CELL DISTRIBUTION WIDTH: 13.8 % (ref 12.2–15.2)
WBC ADJUSTED: 6.1 10*9/L (ref 3.6–11.2)

## 2023-04-20 LAB — PHOSPHORUS: PHOSPHORUS: 4.7 mg/dL (ref 2.4–5.1)

## 2023-04-20 LAB — MAGNESIUM: MAGNESIUM: 2 mg/dL (ref 1.6–2.6)

## 2023-04-20 MED ADMIN — tamsulosin (FLOMAX) 24 hr capsule 0.4 mg: .4 mg | ORAL | @ 12:00:00

## 2023-04-20 MED ADMIN — ketorolac (TORADOL) injection 15 mg: 15 mg | INTRAVENOUS | @ 16:00:00 | Stop: 2023-04-24

## 2023-04-20 MED ADMIN — ketorolac (TORADOL) injection 15 mg: 15 mg | INTRAVENOUS | @ 04:00:00 | Stop: 2023-04-24

## 2023-04-20 MED ADMIN — amlodipine (NORVASC) tablet 5 mg: 5 mg | ORAL | @ 19:00:00

## 2023-04-20 MED ADMIN — methocarbamol (ROBAXIN) 500 mg in sodium chloride (NS) 0.9 % 50 mL IVPB: 500 mg | INTRAVENOUS | @ 12:00:00 | Stop: 2023-04-20

## 2023-04-20 MED ADMIN — acetaminophen (TYLENOL) tablet 1,000 mg: 1000 mg | ORAL | @ 17:00:00

## 2023-04-20 MED ADMIN — metoPROLOL (Lopressor) injection 10 mg: 10 mg | INTRAVENOUS | @ 16:00:00 | Stop: 2023-04-20

## 2023-04-20 MED ADMIN — oxyCODONE (ROXICODONE) 5 mg/5 mL solution 10 mg: 10 mg | ORAL | @ 20:00:00 | Stop: 2023-05-03

## 2023-04-20 MED ADMIN — fat emulsion 20 % with fish oil (SMOFlipid) infusion 250 mL: 250 mL | INTRAVENOUS | @ 02:00:00 | Stop: 2023-04-20

## 2023-04-20 MED ADMIN — pantoprazole (Protonix) injection 40 mg: 40 mg | INTRAVENOUS | @ 12:00:00

## 2023-04-20 MED ADMIN — metoPROLOL (Lopressor) injection 10 mg: 10 mg | INTRAVENOUS | @ 05:00:00 | Stop: 2023-04-20

## 2023-04-20 MED ADMIN — ketorolac (TORADOL) injection 15 mg: 15 mg | INTRAVENOUS | @ 10:00:00 | Stop: 2023-04-24

## 2023-04-20 MED ADMIN — levothyroxine (SYNTHROID) tablet 150 mcg: 150 ug | ORAL | @ 12:00:00

## 2023-04-20 MED ADMIN — acetaminophen (OFIRMEV) 10 mg/mL injection 1,000 mg: 1000 mg | INTRAVENOUS | @ 10:00:00 | Stop: 2023-04-20

## 2023-04-20 MED ADMIN — metoPROLOL (Lopressor) injection 10 mg: 10 mg | INTRAVENOUS | @ 10:00:00 | Stop: 2023-04-20

## 2023-04-20 MED ADMIN — ketorolac (TORADOL) injection 15 mg: 15 mg | INTRAVENOUS | @ 22:00:00 | Stop: 2023-04-24

## 2023-04-20 MED ADMIN — Parenteral Nutrition (CENTRAL): INTRAVENOUS | @ 02:00:00 | Stop: 2023-04-20

## 2023-04-20 MED ADMIN — lidocaine (ASPERCREME) 4 % 1 patch: 1 | TRANSDERMAL | @ 12:00:00

## 2023-04-20 MED ADMIN — calcium carbonate (TUMS) chewable tablet 200 mg elem calcium: 200 mg | ORAL | @ 21:00:00

## 2023-04-20 MED ADMIN — acetaminophen (OFIRMEV) 10 mg/mL injection 1,000 mg: 1000 mg | INTRAVENOUS | @ 03:00:00 | Stop: 2023-04-20

## 2023-04-20 MED ADMIN — sodium chloride (NS) 0.9 % flush 10 mL: 10 mL | INTRAVENOUS | @ 13:00:00

## 2023-04-20 MED ADMIN — sodium chloride (NS) 0.9 % flush 10 mL: 10 mL | INTRAVENOUS | @ 19:00:00

## 2023-04-20 MED ADMIN — oxyCODONE (ROXICODONE) 5 mg/5 mL solution 10 mg: 10 mg | ORAL | @ 12:00:00 | Stop: 2023-05-03

## 2023-04-20 MED ADMIN — HYDROmorphone (PF) injection Syrg 0.5 mg: .5 mg | INTRAVENOUS | @ 22:00:00 | Stop: 2023-04-30

## 2023-04-20 MED ADMIN — oxyCODONE (ROXICODONE) 5 mg/5 mL solution 10 mg: 10 mg | ORAL | Stop: 2023-05-03

## 2023-04-20 MED ADMIN — potassium chloride ER tablet 30 mEq: 30 meq | ORAL | @ 13:00:00 | Stop: 2023-04-20

## 2023-04-20 MED ADMIN — methocarbamol (ROBAXIN) 500 mg in sodium chloride (NS) 0.9 % 50 mL IVPB: 500 mg | INTRAVENOUS | @ 04:00:00

## 2023-04-20 MED ADMIN — methocarbamol (ROBAXIN) tablet 500 mg: 500 mg | ORAL | @ 22:00:00

## 2023-04-20 MED ADMIN — amitriptyline (ELAVIL) tablet 100 mg: 100 mg | ORAL

## 2023-04-20 MED ADMIN — calcium carbonate (TUMS) chewable tablet 200 mg elem calcium: 200 mg | ORAL | @ 18:00:00

## 2023-04-20 MED ADMIN — enoxaparin (LOVENOX) syringe 40 mg: 40 mg | SUBCUTANEOUS | @ 04:00:00

## 2023-04-20 NOTE — Unmapped (Signed)
OCCUPATIONAL THERAPY  Evaluation (04/20/23 0931)    Patient Name:  Sergio Perez       Medical Record Number: [id number]     Date of Birth: 05-11-1961  Sex: Male      Post-Discharge Occupational Therapy Recommendations: 3x weekly   Equipment Recommendation  OT DME Recommendations:  Forensic scientist chair w/ a back (pt declined TTB, stating that he would not have trouble stepping over edge of the tub))       OT Treatment Diagnosis:  Pt presented with abdominal pain, decreased activity tolerance, decreased safety awareness, and impaired judgement which impacted his ability to perform ADLs and functional mobility safely and independently.    Assessment  Problem List: Impaired judgement, Decreased safety awareness, Impaired ADLs, Decreased mobility, Pain, Decreased endurance, Impaired balance, Decreased activity tolerance  Personal Factors/Comorbidities (Occupational Profile and History Review): Expanded (Moderate)    Assessment: Sergio Perez is a 62 y.o. 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 Now s/p ex lap, small bowel resection and omentectomy on 04/16/23.    Pt presented with abdominal pain, decreased activity tolerance, decreased safety awareness, and impaired judgement which impacted his ability to perform ADLs and functional mobility safely and independently. Pt would benefit from continued skilled OT services to maximize his independence and minimize the caregiver burden for DC. Pt is currently appropriate for a recommendation of 3x. After review of the patient's occupational profile and history, assessment of occupational performance, clinical decision making, and development of POC, the patient presents as a moderate complexity case.    Activity Tolerance During Today's Session  Tolerated treatment well    Plan  Planned Frequency of Treatment: Plan of Care Initiated: 04/20/23  1-2x per day for: 3-4x week  Planned Treatment Duration: 05/04/23    Planned Interventions:  Education (Patient/Family/Caregiver), Manual Therapy, Home Exercise Program, Modalities, Therapeutic Activity, Therapeutic Exercise, Self-Care/Home Training      GOALS:   Short Term:   SHORT GOAL #1: Pt will safely perform a toileting routine including t/f mod I using LRAD as needed.   Time Frame : 2 weeks  SHORT GOAL #2: Pt will safely perform LB dressing using LH AE as needed.   Time Frame : 2 weeks  SHORT GOAL #3: Pt will safely perform a 5+ minute standing ADL routine mod I using LRAD as needed.   Time Frame : 2 weeks    Long Term Goal #1: Pt will score 24/24 on the AMPAC in 4 weeks.  Time Frame: 4 weeks    Prognosis:  Good  Positive Indicators:  PLOF, supportive ex-wife  Barriers to Discharge: Endurance deficits, Gait instability, Inability to safely perform ADLS, Poor insight into deficits, Pain, Decreased safety awareness, Impaired Balance    Subjective  Prior Functional Status Per pt, PTA he was independent for ambulation and ADLs w/o AD. Pt drives and is retired. Pt has a small dog. Pt does his own grocery shopping, cooking, and cleaning. Per pt, his ex-wife will be staying on a different part of his property, and will be able to help him as needed when he DC home. Pt reported no falls.    Medical Tests /  Procedures: Reviewed in chart       Patient / Caregiver [place]      Past Medical History:   Diagnosis Date    Hypothyroidism     Squamous cell carcinoma of base of tongue (CMS-HCC) 11/2008    Social History     Tobacco Use    Smoking status: Former     Current packs/day: 0.00     Average packs/day: 1 pack/day for 7.0 years (7.0 ttl pk-yrs)     Types: Cigarettes     Start date: 05/16/2003     Quit date: 05/15/2010     Years since quitting: 12.9    Smokeless tobacco: Never    Tobacco comments:     Quit smoking 16 years ago   Substance Use Topics    Alcohol use: Yes     Alcohol/week: 35.0 standard drinks of alcohol     Types: 35 Cans of beer per week      Past Surgical History:   Procedure Laterality Date    chemoradiotherapy  2010    IR INSERT PORT AGE GREATER THAN 5 YRS  08/19/2022    IR INSERT PORT AGE GREATER THAN 5 YRS 08/19/2022 Evron, Gerri Lins, PA IMG VIR [facility]    laser resection      NECK DISSECTION      PR EXPLORATORY OF ABDOMEN N/A 04/16/2023    Procedure: EXPLORATORY LAPAROTOMY, EXPLORATORY CELIOTOMY WITH OR WITHOUT BIOPSY(S);  Surgeon: Lynnell Dike, DO;  Location: OR [facility];  Service: Surgical Oncology    PR REMOVAL OF OMENTUM N/A 04/16/2023    Procedure: OMENTECTOMY, EPIPLOECTOMY, RESECTION OF OMENTUM;  Surgeon: Lynnell Dike, DO;  Location: OR [facility];  Service: Surgical Oncology    PR RESECT SMALL INTEST,SINGL RESEC/ANAS N/A 04/16/2023    Procedure: ENTERECTOMY SM INTES; SNGL RESECT & ANASTOM;  Surgeon: Lynnell Dike, DO;  Location: OR [facility];  Service: Surgical Oncology    Family History   Problem Relation Age of Onset    Cancer Brother         Brain        Patient has no known allergies.     Objective Findings  Precautions / Restrictions  Falls precautions   Post op abdominal    Weight Bearing  Non-applicable    Required Braces or Orthoses  Non-applicable    Communication Preference  Verbal, Visual       Pain  Pt c/o 5/10 pain in his gut. Per pt, RN was aware of his pain. All activities were adjusted per pt's tolerance.    Equipment / Environment  Vascular access (PIV, TLC, Port-a-cath, PICC), Gastric tube    Living Situation  Living Environment: Trailer/Mobile home  Lives With: Other (ex-wife will be able to help as needed)  Home Living: One level home, Stairs to enter with rails, Tub/shower unit, Standard height toilet  Rail placement (outside): Rail on right side  Number of Stairs to Enter (outside): 4  Equipment available at home: None     Cognition   Orientation Level:  Oriented x 4   Arousal/Alertness:  Appropriate responses to stimuli   Attention Span:  Appears intact   Memory:  Appears intact   Following Commands:  Follows one step commands without difficulty   Safety Judgment:  Decreased awareness of need for safety   Awareness of Errors and Problem Solving:  Assistance required to identify errors made, Assistance required to generate solutions    Vision / Hearing  Vision: Wears glasses for reading only  Hearing: No deficit identified     Hand Function:  Right Hand Function: Right hand grip strength, ROM and coordination WNL  Left Hand Function: Left hand grip strength, ROM and coordination WNL    Skin Inspection:  Skin Inspection: Intact where visualized    ROM / Strength:  UE ROM/Strength: Right WFL, Left WFL    Sensation:  RUE Sensation: RUE impaired  RUE Sensation Impairment: Numbness, Tingling  LUE Sensation: LUE impaired  LUE Sensation Impairment: Numbness, Tingling  RLE Sensation: RLE impaired  RLE Sensation Impairment: Numbness, Tingling  LLE Sensation: LLE impaired  LLE Sensation Impairment: Numbness, Tingling  Sensory/ Proprioception/ Stereognosis comments: Pt reported he has chronic neuropathy in B/L hands and feet.    Balance:  Static Sitting-Level of Assistance: Supervision  Dynamic Sitting-Level of Assistance: Contact guard    Static Standing-Level of Assistance: Contact guard  Dynamic Standing - Level of Assistance: Contact guard    Functional Mobility  Transfers: Contact Guard assist (Sit > stand t/f from EOB w/ CGA using RW from EOB. Standing > sitting in a chair t/f w/ CGA using RW.)  Bed Mobility - Needs Assistance: Contact Guard assist (Pt was educated re: logrolling technique to minimize abdominal discomfort, but he politely declined to try this at this time. Pt performed semisupine > sitting EOB t/f w/ CGA.)  Ambulation: Pt ambulated 600+ feet w/ CGA using RW, requiring 4 standing rest breaks.    ADLs  ADLs: Needs assistance with ADLs  ADLs - Needs Assistance: Feeding, Grooming, Bathing, Toileting, UB dressing, LB dressing  Feeding - Needs Assistance: Set Up Assist, Supervision  Grooming - Needs Assistance: Set Up Assist, Supervision (Simulated face washing)  Bathing - Needs Assistance: Mod assist  Toileting - Needs Assistance: Min assist (Per pt, he has felt alot of abdominal stretching when performing toileting hygiene. Pt was educated re: using a bidet for toileting hygiene.)  UB Dressing - Needs Assistance: Min assist (To don hospital gown in stance)  LB Dressing - Needs Assistance: Total Assist (To don socks 2/2 abdominal pain)  IADLs: Not assessed    Vitals / Orthostatics  Vitals/Orthostatics: Asymptomatic    Patient at end of session: All needs in reach, In chair, Friends/Family present, Lines intact, Nurse notified (RN made aware pt was left sitting up in the chair following this session)     Occupational Therapy Session Duration  OT Individual [mins]: 31       AM-PAC-Daily Activity  Lower Body Dressing assistance needs: Unable to do/total assistance - Total Dependent Assist  Bathing assistance needs: A lot - Maximum/Moderate Assistance  Toileting assistance needs: A Little - Minimal/Contact Guard Assist/Supervision  Upper Body Dressing assistance needs: A Little - Minimal/Contact Guard Assist/Supervision  Personal Grooming assistance needs: A Little - Minimal/Contact Guard Assist/Supervision  Eating Meals assistance needs: A Little - Minimal/Contact Guard Assist/Supervision    Daily Activity Score: 15    Score (in points): % of Functional Impairment, Limitation, Restriction  6: 100% impaired, limited, restricted  7-8: At least 80%, but less than 100% impaired, limited restricted  9-13: At least 60%, but less than 80% impaired, limited restricted  14-19: At least 40%, but less than 60% impaired, limited restricted  20-22: At least 20%, but less than 40% impaired, limited restricted  23: At least 1%, but less than 20% impaired, limited restricted  24: 0% impaired, limited restricted      I attest that I have reviewed the above information.  Signed: Eben Burow, OT  Filed 04/20/2023

## 2023-04-20 NOTE — Unmapped (Signed)
Linking order to Epic. Please see consult note written 04/19/23 for full details, thank you.

## 2023-04-20 NOTE — Unmapped (Signed)
VSS.  Surgical site wnl, on IV antibiotics.  GT to SD with watery light green output.  Continues on TPN/lipids.  On TOV, has to pass two more.  Patient states he knows his body and will pee when he feels like it.  Relayed this message to the resident.  Pain controlled with PCA pump.  Cont POC.  Problem: Adult Inpatient Plan of Care  Goal: Plan of Care Review  Outcome: Ongoing - Unchanged  Goal: Patient-Specific Goal (Individualized)  Outcome: Ongoing - Unchanged  Goal: Absence of Hospital-Acquired Illness or Injury  Outcome: Ongoing - Unchanged  Intervention: Prevent and Manage VTE (Venous Thromboembolism) Risk  Recent Flowsheet Documentation  Taken 04/19/2023 2000 by Ignatius Specking, RN  VTE Prevention/Management:   anticoagulant therapy   bleeding precautions maintained   ambulation promoted  Goal: Optimal Comfort and Wellbeing  Outcome: Ongoing - Unchanged  Goal: Readiness for Transition of Care  Outcome: Ongoing - Unchanged  Goal: Rounds/Family Conference  Outcome: Ongoing - Unchanged     Problem: Malnutrition  Goal: Improved Nutritional Intake  Outcome: Ongoing - Unchanged     Problem: Wound  Goal: Optimal Coping  Outcome: Ongoing - Unchanged  Goal: Optimal Functional Ability  Outcome: Ongoing - Unchanged  Goal: Absence of Infection Signs and Symptoms  Outcome: Ongoing - Unchanged  Goal: Improved Oral Intake  Outcome: Ongoing - Unchanged  Goal: Optimal Pain Control and Function  Outcome: Ongoing - Unchanged  Goal: Skin Health and Integrity  Outcome: Ongoing - Unchanged  Goal: Optimal Wound Healing  Outcome: Ongoing - Unchanged     Problem: Fall Injury Risk  Goal: Absence of Fall and Fall-Related Injury  Outcome: Ongoing - Unchanged     Problem: Nausea and Vomiting  Goal: Nausea and Vomiting Relief  Outcome: Ongoing - Unchanged     Problem: Nausea and Vomiting  Goal: Nausea and Vomiting Relief  Outcome: Ongoing - Unchanged     Problem: Pain Acute  Goal: Optimal Pain Control and Function  Outcome: Ongoing - Unchanged     Problem: Comorbidity Management  Goal: Blood Pressure in Desired Range  Outcome: Ongoing - Unchanged

## 2023-04-20 NOTE — Unmapped (Signed)
Pt has moderate pain. See MAR. Denies nausea sx. Has sx of acid reflux, meds given. On clear liquid diet. IV fluids decreased to 10 ml/hr r/t PCA fluids. G-tube clamped, per orders. Ambulating with assistance, tolerating well. Voiding without complication. Had 2 Lg bowel movements. Call light within reach, bed lowered and locked for safety.       Problem: Adult Inpatient Plan of Care  Goal: Plan of Care Review  Outcome: Progressing  Goal: Patient-Specific Goal (Individualized)  Outcome: Progressing  Goal: Absence of Hospital-Acquired Illness or Injury  Outcome: Progressing  Intervention: Prevent Skin Injury  Recent Flowsheet Documentation  Taken 04/20/2023 0800 by Eartha Inch, RN  Positioning for Skin: (at bedside) Sitting in Chair  Intervention: Prevent and Manage VTE (Venous Thromboembolism) Risk  Recent Flowsheet Documentation  Taken 04/20/2023 0800 by Eartha Inch, RN  VTE Prevention/Management:   anticoagulant therapy   bleeding precautions maintained   ambulation promoted  Anti-Embolism Device Type: SCD, Knee  Anti-Embolism Device Location: BLE  Goal: Optimal Comfort and Wellbeing  Outcome: Progressing  Goal: Readiness for Transition of Care  Outcome: Progressing  Goal: Rounds/Family Conference  Outcome: Progressing     Problem: Malnutrition  Goal: Improved Nutritional Intake  Outcome: Progressing     Problem: Wound  Goal: Optimal Coping  Outcome: Progressing  Goal: Optimal Functional Ability  Outcome: Progressing  Goal: Absence of Infection Signs and Symptoms  Outcome: Progressing  Goal: Improved Oral Intake  Outcome: Progressing  Goal: Optimal Pain Control and Function  Outcome: Progressing  Goal: Skin Health and Integrity  Outcome: Progressing  Goal: Optimal Wound Healing  Outcome: Progressing     Problem: Fall Injury Risk  Goal: Absence of Fall and Fall-Related Injury  Outcome: Progressing     Problem: Nausea and Vomiting  Goal: Nausea and Vomiting Relief  Outcome: Progressing     Problem: Nausea and Vomiting  Goal: Nausea and Vomiting Relief  Outcome: Progressing     Problem: Pain Acute  Goal: Optimal Pain Control and Function  Outcome: Progressing     Problem: Comorbidity Management  Goal: Blood Pressure in Desired Range  Outcome: Progressing     Problem: Surgery Nonspecified  Goal: Absence of Bleeding  Outcome: Progressing  Goal: Effective Bowel Elimination  Outcome: Progressing  Goal: Fluid and Electrolyte Balance  Outcome: Progressing  Goal: Blood Glucose Level Within Targeted Range  Outcome: Progressing  Goal: Absence of Infection Signs and Symptoms  Outcome: Progressing  Goal: Anesthesia/Sedation Recovery  Outcome: Progressing  Intervention: Optimize Anesthesia Recovery  Recent Flowsheet Documentation  Taken 04/20/2023 0800 by Eartha Inch, RN  Administration (IS): instruction provided, initial  Goal: Optimal Pain Control and Function  Outcome: Progressing  Goal: Nausea and Vomiting Relief  Outcome: Progressing  Goal: Effective Urinary Elimination  Outcome: Progressing  Goal: Effective Oxygenation and Ventilation  Outcome: Progressing

## 2023-04-20 NOTE — Unmapped (Signed)
=  Department of Anesthesiology  Pain Medicine Division    Chronic Pain in-patient Consult follow-up Note      Requesting Attending Physician:  Lynnell Dike, DO  Service Requesting Consult:  Sur Oncology [facility])    Assessment/Recommendations:  The patient was seen in consultation on request of Lynnell Dike, DO regarding assistance with pain management. The patient is obtaining adequate pain relief on current medication regimen.     The patient is a 62 y.o. 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      Now s/p ex lap, small bowel resection and omentectomy on 04/16/23.     Interval:   Today, patient is comfortable. Slept last night with some interruptions. Having BM. Patient is still weaning PCA dilaudid. Discussed about discontinuing PCA. Patient agreeable with it. No side effects from medicines.     Home regimen:    Oxycodone 5 mg every 6 hours as needed    Recommendations:  -The chronic pain service is a consult service and does not place orders, just makes recommendations (except ketamine and lidocaine infusions)  -Please evaluate all patients on opioids for appropriateness of prescribing narcan at discharge.  The chronic pain service can assist with this.  Nasal narcan covered by most insurance.  -Recommendations given apply to the current hospitalization and do not reflect long term recommendations.    -We recommend to  - discontinue Dilaudid PCA -   -Continue Dilaudid 0.5 mg every 4 hours as needed  -Continue oxycodone liquid 5 mg - 10 mg every 4 hours as needed.  Per PDMP last fill was 04/06/23 for 5 mg tabs 90 tabs. Please prescribe enough to cover until seen by outpatient provider.   -Can add NSAIDs short term when okayed by primary team    We will sign off on this patient at this time.  Please reach out to [place] with any questions or concerns    Prescribe naloxone at discharge.  Nasal narcan for most insured (Nasal narcan 4mg /actuation, prescribe 1 kit, instructions at [email or web address]).  For uninsured, chronic pain can work to assist in finding an option.  OTC nasal narcan now available at most pharmacies for around $45.    History of Present Illness:    Reason for Consult: Acute postoperative pain, chronic pain syndrome, history of colon cancer    When we went to see him today patient says that his pain is doing better with his oral pain medication.  Patient thought that he has been using Dilaudid PCA however he has not had any dose delivered to him since 11 AM earlier today.  He saw him at around 4 PM.  We discussed with the patient about discontinuing his PCA pump.  Patient is agreeable with the same.  Has no other new pain.    Pt had 1st BM today. No abd distension. No vomiting. No fever.     Prior to admission, the patient was on home opiate medications.  Hehas been followed by a pain clinic.  The [place] CSRS was reviewed.    Allergies    Allergies   Allergen Reactions    Hydrocodone Hives  Home Medications    Medications Prior to Admission   Medication Sig Dispense Refill Last Dose    acetaminophen (TYLENOL) 500 MG tablet Take 2 tablets (1,000 mg total) by mouth every twelve (12) hours as needed for pain.       amitriptyline (ELAVIL) 100 MG tablet Take 1 tablet (100 mg total) by mouth nightly. Frequency:QHSPRN   Dosage:50   MG  Instructions:  Note:Dose: 50MG        amLODIPine (NORVASC) 10 MG tablet        atorvastatin (LIPITOR) 40 MG tablet Take 1 tablet (40 mg total) by mouth daily.       clopidogreL (PLAVIX) 75 mg tablet clopidogrel 75 mg tablet   TAKE 1 TABLET BY MOUTH ONCE DAILY       diphenoxylate-atropine (LOMOTIL) 2.5-0.025 mg per tablet Take 1 tablet by mouth 4 times daily 30 tablet 0     doxycycline (MONODOX) 100 MG capsule Take 1 capsule (100 mg total) by mouth two (2) times a day. 60 capsule 5     fenofibrate (LOFIBRA) 160 MG tablet Take 1 tablet (160 mg total) by mouth daily.       fluoride, sodium, (DENTAGEL) 1.1 % Gel Apply 1 application topically two (2) times a day. 56 g 7     gabapentin (NEURONTIN) 400 MG capsule 1 in the AM; 1 at lunch and 3 at bedtime       levothyroxine (SYNTHROID, LEVOTHROID) 150 MCG tablet Take 1 tablet (150 mcg total) by mouth daily. 30 tablet 3     loperamide (IMODIUM A-D) 2 mg tablet Take 2 tablets (4 mg) by mouth for first loose stool followed by 1 tablet (2 mg) after each loose stool thereafter (maximum of 8 tablets per day). 30 tablet 11     magnesium oxide (MAG-OX) 400 mg (241.3 mg elemental magnesium) tablet Take 1 tablet (400 mg total) by mouth two (2) times a day. 60 tablet 11     metoprolol tartrate (LOPRESSOR) 100 MG tablet 1 tablet (100 mg total) two (2) times a day.       MITIGARE 0.6 mg cap capsule        ondansetron (ZOFRAN) 8 MG tablet Take 1 tablet (8 mg total) by mouth every eight (8) hours as needed for nausea. Do not take on day 1 of chemotherapy cycles. 60 tablet 3     oxyCODONE (ROXICODONE) 5 MG immediate release tablet Take 1 tablet (5 mg total) by mouth every six (6) hours as needed for pain. 90 tablet 0     pantoprazole (PROTONIX) 40 MG tablet Take 1 tablet (40 mg total) by mouth daily.       prochlorperazine (COMPAZINE) 10 MG tablet Take 1 tablet (10 mg total) by mouth every six (6) hours as needed (Nausea & Vomiting). 60 tablet 3     trifluridine-tipiracil (LONSURF) 15-6.14 mg tablet Take 5 tablets (75 mg total) by mouth in the morning and 5 tablets (75 mg total) in the evening. Take with meals. Take for 5 days on, 9 days off for every 14 day cycle. 100 tablet 6        Inpatient Medications  Current Facility-Administered Medications   Medication Dose Route Frequency Provider Last Rate Last Admin    acetaminophen (TYLENOL) tablet 1,000 mg  1,000 mg Oral Q8H Harris, Lurline Del, FNP        amitriptyline (ELAVIL) tablet 100 mg  100 mg Oral Nightly Kirke Shaggy, FNP   100 mg at 04/19/23  2006    calcium carbonate (TUMS) chewable tablet 200 mg elem calcium  200 mg elem calcium Oral TID PRN Karna Christmas, MD        enoxaparin (LOVENOX) syringe 40 mg  40 mg Subcutaneous Q24H Emilio Aspen, MD   40 mg at 04/19/23 2355    Parenteral Nutrition (CENTRAL)   Intravenous Continuous Kirke Shaggy, FNP 60 mL/hr at 04/19/23 2159 New Bag at 04/19/23 2159    And    fat emulsion 20 % with fish oil (SMOFlipid) infusion 250 mL  250 mL Intravenous Continuous Kirke Shaggy, FNP 20.8 mL/hr at 04/19/23 2159 250 mL at 04/19/23 2159    Parenteral Nutrition (CENTRAL)   Intravenous Continuous Tiburcio Pea Lurline Del, FNP        And    fat emulsion 20 % with fish oil (SMOFlipid) infusion 250 mL  250 mL Intravenous Continuous Harris, Lurline Del, FNP        HYDROmorphone (DILAUDID) 50mg /60ml (1mg /ml) PCA CADD   Intravenous Continuous Karna Christmas, MD        HYDROmorphone (PF) injection Syrg 0.5 mg  0.5 mg Intravenous Q4H PRN Algie Coffer, Esha R, MD   0.5 mg at 04/19/23 0827    hydrOXYzine (ATARAX) tablet 25 mg  25 mg Oral Q4H PRN Karna Christmas, MD   25 mg at 04/18/23 0819    ketorolac (TORADOL) injection 15 mg  15 mg Intravenous Q6H [facility] Sidonie Dickens R, MD   15 mg at 04/20/23 2956    levothyroxine (SYNTHROID) tablet 150 mcg  150 mcg Oral Daily Burman Riis, MD   150 mcg at 04/20/23 0812    lidocaine (ASPERCREME) 4 % 1 patch  1 patch Transdermal Daily Sidonie Dickens R, MD   1 patch at 04/20/23 0810    methocarbamol (ROBAXIN) 500 mg in sodium chloride (NS) 0.9 % 50 mL IVPB  500 mg Intravenous Q8H Emilio Aspen, MD   Stopped at 04/20/23 [id number]    metoPROLOL (Lopressor) injection 10 mg  10 mg Intravenous Q6H [facility] Kirke Shaggy, FNP   10 mg at 04/20/23 8657    naloxone [facility]) injection 0.4 mg  0.4 mg Intravenous Q5 Min PRN Emilio Aspen, MD        ondansetron [facility]) injection 4 mg  4 mg Intravenous Q6H PRN Emilio Aspen, MD   4 mg at 04/17/23 1311    oxyCODONE (ROXICODONE) 5 mg/5 mL solution 5 mg  5 mg Oral Q4H PRN Algie Coffer, Esha R, MD        Or    oxyCODONE (ROXICODONE) 5 mg/5 mL solution 10 mg  10 mg Oral Q4H PRN Algie Coffer, Esha R, MD   10 mg at 04/20/23 0810    pantoprazole (Protonix) injection 40 mg  40 mg Intravenous Daily Emilio Aspen, MD   40 mg at 04/20/23 8469    prochlorperazine (COMPAZINE) injection 5 mg  5 mg Intravenous Q6H PRN Emilio Aspen, MD   5 mg at 04/17/23 0800    sodium chloride (NS) 0.9 % flush 10 mL  10 mL Intravenous Q8H Emilio Aspen, MD   10 mL at 04/20/23 0836    sodium chloride (NS) 0.9 % flush 10 mL  10 mL Intravenous Q8H Emilio Aspen, MD   10 mL at 04/20/23 0836    sodium chloride 0.45% (1/2 NS) infusion  0-125 mL/hr Intravenous Continuous Kirke Shaggy, FNP 65 mL/hr at 04/19/23 0820 65 mL/hr at 04/19/23 0820  tamsulosin (FLOMAX) 24 hr capsule 0.4 mg  0.4 mg Oral Daily Emilio Aspen, MD   0.4 mg at 04/20/23 8119         Past Medical History    Past Medical History:   Diagnosis Date    Hypothyroidism     Squamous cell carcinoma of base of tongue (CMS-HCC) 11/2008           Past Surgical History    Past Surgical History:   Procedure Laterality Date    chemoradiotherapy  2010    IR INSERT PORT AGE GREATER THAN 5 YRS  08/19/2022    IR INSERT PORT AGE GREATER THAN 5 YRS 08/19/2022 Evron, Gerri Lins, PA IMG VIR [facility]    laser resection      NECK DISSECTION      PR EXPLORATORY OF ABDOMEN N/A 04/16/2023    Procedure: EXPLORATORY LAPAROTOMY, EXPLORATORY CELIOTOMY WITH OR WITHOUT BIOPSY(S);  Surgeon: Lynnell Dike, DO;  Location: OR [facility];  Service: Surgical Oncology    PR REMOVAL OF OMENTUM N/A 04/16/2023    Procedure: OMENTECTOMY, EPIPLOECTOMY, RESECTION OF OMENTUM;  Surgeon: Lynnell Dike, DO;  Location: OR [facility];  Service: Surgical Oncology    PR RESECT SMALL INTEST,SINGL RESEC/ANAS N/A 04/16/2023    Procedure: ENTERECTOMY SM INTES; SNGL RESECT & ANASTOM;  Surgeon: Lynnell Dike, DO;  Location: OR [facility];  Service: Surgical Oncology           Family History    Family History   Problem Relation Age of Onset    Cancer Brother         Brain         Social History:  Social History     Tobacco Use   Smoking Status Former    Current packs/day: 0.00    Average packs/day: 1 pack/day for 7.0 years (7.0 ttl pk-yrs)    Types: Cigarettes    Start date: 05/16/2003    Quit date: 05/15/2010    Years since quitting: 12.9   Smokeless Tobacco Never     Social History     Substance and Sexual Activity   Alcohol Use Yes     Social History     Substance and Sexual Activity   Drug Use Yes    Types: Marijuana         Lab Results   Component Value Date    CREATININE 0.57 (L) 04/20/2023       Lab Results   Component Value Date    ALKPHOS 60 04/16/2023    BILITOT 0.2 (L) 04/16/2023    BILIDIR 0.10 04/16/2023    PROT 6.8 04/16/2023    ALBUMIN 3.2 (L) 04/16/2023    ALT 9 (L) 04/16/2023    AST 14 04/16/2023       Encounter Date: 04/14/23   ECG 12 Lead   Result Value    EKG Systolic BP     EKG Diastolic BP     EKG Ventricular Rate 130    EKG Atrial Rate 130    EKG P-R Interval 134    EKG QRS Duration 84    EKG Q-T Interval 314    EKG QTC Calculation 462    EKG Calculated P Axis 14    EKG Calculated R Axis 12    EKG Calculated T Axis 31    QTC Fredericia 406    Narrative    SINUS TACHYCARDIA  OTHERWISE NORMAL ECG  NO PREVIOUS ECGS AVAILABLE  Confirmed by Mariane Baumgarten ([id number])  on 04/16/2023 9:48:15 PM       No results found for requested labs within last 30 days.       Objective:     Vital Signs  Temp:  [36.2 ??C (97.2 ??F)-36.5 ??C (97.7 ??F)] 36.4 ??C (97.5 ??F)  Heart Rate:  [84-112] 86  Resp:  [16-18] 16  BP: (124-157)/(88-104) 153/93  MAP (mmHg): [99-118] 110  SpO2:  [95 %-98 %] 98 %    Physical Exam    GENERAL:  Well developed, well-nourished male and is in no apparent distress.   HEAD/NECK:    Reveals normocephalic/atraumatic.   CARDIOVASCULAR:   Regular rate  LUNGS:   Normal work of breathing, no supplemental 02  EXTREMITIES:  Warm, no clubbing, cyanosis, or edema was noted.  NEUROLOGIC:    The patient was alert and oriented times four with normal language, attention, cognition and memory. Cranial nerve exam was grossly normal.   MUSCULOSKELETAL:    Patient ambulatory.  SKIN:  No obvious rashes lesions or erythema  PSY:  Appropriate affect and mood.      Problem List    Malignant bowel obstruction  Chronic pain syndrome

## 2023-04-20 NOTE — Unmapped (Signed)
Surgery Progress Note  4 Days [facility] Service: SRA - GI 1 [address]) [phone]  Admitting Attending: Surgical Oncology Attendings: Daryl Eastern, DO      Assessment:     Sergio Perez is a 62 y.o. 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     Now s/p ex lap, small bowel resection and omentectomy on 04/16/23.     Interval Events:   NAEON. Tachycardia improved, HR 80s. Pain better overnight. States he has voided 5 times and feels he is completely emptying, better than at home. Had 2 BM with some flatus. Ambulated yesterday with PT, recommend 3x weekly. G-tube with 1.025L bilious output. No nausea, vomiting.      Plan:       Neuro/Pain:   *Hx of CVA   - Holding home Plavix   *Pain/Nausea   - Scheduled IV Tylenol   - Wean Dilaudid PCA (total 4hr dose 2.4), PRN IV HM, PO liquid oxy    - Toradol 15mg  q6hr scheduled    - Compazine prn    - Chronic pain c/s - appreciate recs       CV: VSS and normotensive  *Hx of HTN, HLD  - Home metoprolol 100mg  BID held, IV metoprolol 10mg  q6hrs   - Holding home fenofibrate, atorvastatin       Pulm: NWOB and on RA      GI  - F: TPN  - E: replete PRN  - N: NPO with sips of clears, may advance to CLD pending G-tube clamp tolerance   - Clamp G-tube today   - PPI    - Protonix, PRN tums       Endo:   *Hx of hypothyroidism  - Home levothyroxine      Renal/GU:   - Passed TOV  - Flomax 0.4mg  daily       Heme/ID:   - Daily CBC   - Lovenox DVT ppx      Dispo: Floor    Please page SRA - GI 1 [address]azar[address] Gowda, [place]) [phone] with any questions.     Attending Attestation:    I supervised the resident for the history and exam, and conducted pertinent aspects of the history and exam after the initial review. I discussed the findings, assessment and plan with the resident and agree with the findings and plan as documented in the resident???s note on the date listed above.    Improving.  OOB to chair and ambulating.  Will continue TPN.  Clamp G tube.    Objective:      Vital Signs:  BP 153/93 Comment: informed nurse - Pulse 86  - Temp 36.4 ??C (97.5 ??F) (Oral)  - Resp 16  - Ht 182.9 cm (6' 0.01)  - Wt 91.7 kg (202 lb 2.6 oz)  - SpO2 98%  - BMI 27.41 kg/m??     Input/Output:  I/O         09/15 0701  09/16 0700 09/16 0701  09/17 0700 09/17 0701  09/18 0700    P.O. 0 50     I.V. (mL/kg) 510 (5.7) 741.6 (8.1)  IV Piggyback 372 334     TPN 4667.5 1765.6     Total Intake 5549.5 2891.2     Urine (mL/kg/hr) 1900 (0.9) 250 (0.1) 500 (5.1)    Emesis/NG output 2125 1025     Stool 0 1     Total Output(mL/kg) 4025 (45.4) 1276 (13.9) 500 (5.5)    Net +1524.5 +1615.2 -500           Urine Occurrence  1 x     Stool Occurrence 0 x 1 x     Emesis Occurrence 0 x              Physical Exam:    General: Cooperative, no distress, well appearing male  Neuro: Alert, oriented, appropriate  HEENT: Normocephalic, atraumatic  Pulmonary: Normal work of breathing, equal bilateral chest rise  Cardiovascular: Regular rate and rhythm  Abdomen: softer, improved distension, mildly tender  Extremities: no peripheral edema    Labs:    Lab Results   Component Value Date    WBC 6.1 04/20/2023    HGB 10.6 (L) 04/20/2023    HCT 32.1 (L) 04/20/2023    PLT 313 04/20/2023       Lab Results   Component Value Date    NA 136 04/20/2023    K 3.7 04/20/2023    CL 105 04/20/2023    CO2 25.0 04/20/2023    BUN 18 04/20/2023    CREATININE 0.57 (L) 04/20/2023    CALCIUM 8.8 04/20/2023    MG 2.0 04/20/2023    PHOS 4.7 04/20/2023       Microbiology Results (last day)       ** No results found for the last 24 hours. **            Imaging:   CT A/P from OSH 9/10:   -Multiple dilated loops of small bowel, with somewhat gradual transition to decompressed small bowel in the right lower quadrant. Findings raise a concern for developing partial small bowel obstruction.  -Peritoneum and omental carcinomatosis with increasing loculated appearing inferior perihepatic and perisplenic ascites.  -2.5 cm enhancing lesion in segment 6 of the liver, stable in size.      Karna Christmas, MD   PGY-1 General Surgery

## 2023-04-21 LAB — BASIC METABOLIC PANEL
ANION GAP: 9 mmol/L (ref 5–14)
BLOOD UREA NITROGEN: 17 mg/dL (ref 9–23)
BUN / CREAT RATIO: 34
CALCIUM: 8.8 mg/dL (ref 8.7–10.4)
CHLORIDE: 105 mmol/L (ref 98–107)
CO2: 23 mmol/L (ref 20.0–31.0)
CREATININE: 0.5 mg/dL — ABNORMAL LOW
EGFR CKD-EPI (2021) MALE: >90 mL/min/{1.73_m2} (ref >=60)
GLUCOSE RANDOM: 143 mg/dL (ref 70–179)
POTASSIUM: 4.4 mmol/L (ref 3.4–4.8)
SODIUM: 137 mmol/L (ref 135–145)

## 2023-04-21 LAB — MAGNESIUM: MAGNESIUM: 1.7 mg/dL (ref 1.6–2.6)

## 2023-04-21 LAB — CBC W/ AUTO DIFF
BASOPHILS ABSOLUTE COUNT: 0 10*9/L (ref 0.0–0.1)
BASOPHILS RELATIVE PERCENT: 0.2 %
EOSINOPHILS ABSOLUTE COUNT: 0 10*9/L (ref 0.0–0.5)
EOSINOPHILS RELATIVE PERCENT: 0.7 %
HEMATOCRIT: 33.1 % — ABNORMAL LOW (ref 39.0–48.0)
HEMOGLOBIN: 11.1 g/dL — ABNORMAL LOW (ref 12.9–16.5)
LYMPHOCYTES ABSOLUTE COUNT: 0.7 10*9/L — ABNORMAL LOW (ref 1.1–3.6)
LYMPHOCYTES RELATIVE PERCENT: 9.2 %
MEAN CORPUSCULAR HEMOGLOBIN CONC: 33.5 g/dL (ref 32.0–36.0)
MEAN CORPUSCULAR HEMOGLOBIN: 30.1 pg (ref 25.9–32.4)
MEAN CORPUSCULAR VOLUME: 89.9 fL (ref 77.6–95.7)
MEAN PLATELET VOLUME: 7.5 fL (ref 6.8–10.7)
MONOCYTES ABSOLUTE COUNT: 0.6 10*9/L (ref 0.3–0.8)
MONOCYTES RELATIVE PERCENT: 7.9 %
NEUTROPHILS ABSOLUTE COUNT: 6 10*9/L (ref 1.8–7.8)
NEUTROPHILS RELATIVE PERCENT: 82 %
PLATELET COUNT: 365 10*9/L (ref 150–450)
RED BLOOD CELL COUNT: 3.68 10*12/L — ABNORMAL LOW (ref 4.26–5.60)
RED CELL DISTRIBUTION WIDTH: 14 % (ref 12.2–15.2)
WBC ADJUSTED: 7.4 10*9/L (ref 3.6–11.2)

## 2023-04-21 LAB — PHOSPHORUS: PHOSPHORUS: 3.3 mg/dL (ref 2.4–5.1)

## 2023-04-21 MED ADMIN — amitriptyline (ELAVIL) tablet 100 mg: 100 mg | ORAL | @ 01:00:00

## 2023-04-21 MED ADMIN — acetaminophen (TYLENOL) tablet 1,000 mg: 1000 mg | ORAL | @ 01:00:00

## 2023-04-21 MED ADMIN — Parenteral Nutrition (CENTRAL): INTRAVENOUS | @ 02:00:00 | Stop: 2023-04-21

## 2023-04-21 MED ADMIN — oxyCODONE (ROXICODONE) 5 mg/5 mL solution 10 mg: 10 mg | ORAL | @ 15:00:00 | Stop: 2023-05-03

## 2023-04-21 MED ADMIN — sodium chloride (NS) 0.9 % flush 10 mL: 10 mL | INTRAVENOUS | @ 13:00:00

## 2023-04-21 MED ADMIN — acetaminophen (TYLENOL) tablet 1,000 mg: 1000 mg | ORAL | @ 19:00:00

## 2023-04-21 MED ADMIN — sodium chloride (NS) 0.9 % flush 10 mL: 10 mL | INTRAVENOUS | @ 21:00:00

## 2023-04-21 MED ADMIN — ketorolac (TORADOL) injection 15 mg: 15 mg | INTRAVENOUS | @ 10:00:00 | Stop: 2023-04-24

## 2023-04-21 MED ADMIN — metoPROLOL tartrate (Lopressor) tablet 100 mg: 100 mg | ORAL | @ 21:00:00

## 2023-04-21 MED ADMIN — ketorolac (TORADOL) injection 15 mg: 15 mg | INTRAVENOUS | @ 04:00:00 | Stop: 2023-04-24

## 2023-04-21 MED ADMIN — oxyCODONE (ROXICODONE) 5 mg/5 mL solution 10 mg: 10 mg | ORAL | @ 21:00:00 | Stop: 2023-05-03

## 2023-04-21 MED ADMIN — acetaminophen (TYLENOL) tablet 1,000 mg: 1000 mg | ORAL | @ 10:00:00

## 2023-04-21 MED ADMIN — levothyroxine (SYNTHROID) tablet 150 mcg: 150 ug | ORAL | @ 13:00:00

## 2023-04-21 MED ADMIN — ondansetron (ZOFRAN) injection 4 mg: 4 mg | INTRAVENOUS | @ 19:00:00

## 2023-04-21 MED ADMIN — fat emulsion 20 % with fish oil (SMOFlipid) infusion 250 mL: 250 mL | INTRAVENOUS | @ 02:00:00 | Stop: 2023-04-21

## 2023-04-21 MED ADMIN — ketorolac (TORADOL) injection 15 mg: 15 mg | INTRAVENOUS | @ 16:00:00 | Stop: 2023-04-24

## 2023-04-21 MED ADMIN — ondansetron (ZOFRAN) injection 4 mg: 4 mg | INTRAVENOUS | @ 07:00:00

## 2023-04-21 MED ADMIN — HYDROmorphone (PF) injection Syrg 0.5 mg: .5 mg | INTRAVENOUS | @ 19:00:00 | Stop: 2023-05-05

## 2023-04-21 MED ADMIN — tamsulosin (FLOMAX) 24 hr capsule 0.4 mg: .4 mg | ORAL | @ 13:00:00

## 2023-04-21 MED ADMIN — enoxaparin (LOVENOX) syringe 40 mg: 40 mg | SUBCUTANEOUS | @ 04:00:00

## 2023-04-21 MED ADMIN — lidocaine (ASPERCREME) 4 % 1 patch: 1 | TRANSDERMAL | @ 13:00:00

## 2023-04-21 MED ADMIN — pantoprazole (Protonix) injection 40 mg: 40 mg | INTRAVENOUS | @ 13:00:00

## 2023-04-21 MED ADMIN — amlodipine (NORVASC) tablet 5 mg: 5 mg | ORAL | @ 13:00:00

## 2023-04-21 MED ADMIN — sodium chloride (NS) 0.9 % flush 10 mL: 10 mL | INTRAVENOUS | @ 02:00:00

## 2023-04-21 MED ADMIN — oxyCODONE (ROXICODONE) 5 mg/5 mL solution 10 mg: 10 mg | ORAL | @ 01:00:00 | Stop: 2023-05-03

## 2023-04-21 MED ADMIN — ketorolac (TORADOL) injection 15 mg: 15 mg | INTRAVENOUS | @ 22:00:00 | Stop: 2023-04-24

## 2023-04-21 NOTE — Unmapped (Signed)
VSS.  Surgical sites wnl.  Cont on TPN/lipids.  Pain controlled with scheduled and prn anagesics.  Free of falls.  G-tube remained clamped .  Cont POC  Problem: Adult Inpatient Plan of Care  Goal: Plan of Care Review  Outcome: Ongoing - Unchanged  Goal: Patient-Specific Goal (Individualized)  Outcome: Ongoing - Unchanged  Goal: Absence of Hospital-Acquired Illness or Injury  Outcome: Ongoing - Unchanged  Goal: Optimal Comfort and Wellbeing  Outcome: Ongoing - Unchanged  Goal: Readiness for Transition of Care  Outcome: Ongoing - Unchanged  Goal: Rounds/Family Conference  Outcome: Ongoing - Unchanged     Problem: Malnutrition  Goal: Improved Nutritional Intake  Outcome: Ongoing - Unchanged     Problem: Wound  Goal: Optimal Coping  Outcome: Ongoing - Unchanged  Goal: Optimal Functional Ability  Outcome: Ongoing - Unchanged  Goal: Absence of Infection Signs and Symptoms  Outcome: Ongoing - Unchanged  Goal: Improved Oral Intake  Outcome: Ongoing - Unchanged  Goal: Optimal Pain Control and Function  Outcome: Ongoing - Unchanged  Goal: Skin Health and Integrity  Outcome: Ongoing - Unchanged  Goal: Optimal Wound Healing  Outcome: Ongoing - Unchanged     Problem: Fall Injury Risk  Goal: Absence of Fall and Fall-Related Injury  Outcome: Ongoing - Unchanged     Problem: Nausea and Vomiting  Goal: Nausea and Vomiting Relief  Outcome: Ongoing - Unchanged     Problem: Nausea and Vomiting  Goal: Nausea and Vomiting Relief  Outcome: Ongoing - Unchanged     Problem: Pain Acute  Goal: Optimal Pain Control and Function  Outcome: Ongoing - Unchanged     Problem: Comorbidity Management  Goal: Blood Pressure in Desired Range  Outcome: Ongoing - Unchanged     Problem: Surgery Nonspecified  Goal: Absence of Bleeding  Outcome: Ongoing - Unchanged  Goal: Effective Bowel Elimination  Outcome: Ongoing - Unchanged  Goal: Fluid and Electrolyte Balance  Outcome: Ongoing - Unchanged  Goal: Blood Glucose Level Within Targeted Range  Outcome: Ongoing - Unchanged  Goal: Absence of Infection Signs and Symptoms  Outcome: Ongoing - Unchanged  Goal: Anesthesia/Sedation RecoveryVSS.  VS  Outcome: Ongoing - Unchanged  Goal: Effective Urinary Elimination  Outcome: Ongoing - Unchanged  Goal: Effective Oxygenation and Ventilation  Outcome: Ongoing - Unchanged

## 2023-04-22 LAB — BASIC METABOLIC PANEL
ANION GAP: 7 mmol/L (ref 5–14)
BLOOD UREA NITROGEN: 19 mg/dL (ref 9–23)
BUN / CREAT RATIO: 35
CALCIUM: 8.8 mg/dL (ref 8.7–10.4)
CHLORIDE: 106 mmol/L (ref 98–107)
CO2: 24 mmol/L (ref 20.0–31.0)
CREATININE: 0.54 mg/dL — ABNORMAL LOW
EGFR CKD-EPI (2021) MALE: >90 mL/min/{1.73_m2} (ref >=60)
GLUCOSE RANDOM: 103 mg/dL (ref 70–179)
POTASSIUM: 4.3 mmol/L (ref 3.4–4.8)
SODIUM: 137 mmol/L (ref 135–145)

## 2023-04-22 LAB — MAGNESIUM: MAGNESIUM: 1.8 mg/dL (ref 1.6–2.6)

## 2023-04-22 LAB — CBC W/ AUTO DIFF
BASOPHILS ABSOLUTE COUNT: 0 10*9/L (ref 0.0–0.1)
BASOPHILS RELATIVE PERCENT: 0.2 %
EOSINOPHILS ABSOLUTE COUNT: 0.1 10*9/L (ref 0.0–0.5)
EOSINOPHILS RELATIVE PERCENT: 1 %
HEMATOCRIT: 31.3 % — ABNORMAL LOW (ref 39.0–48.0)
HEMOGLOBIN: 10.6 g/dL — ABNORMAL LOW (ref 12.9–16.5)
LYMPHOCYTES ABSOLUTE COUNT: 1.1 10*9/L (ref 1.1–3.6)
LYMPHOCYTES RELATIVE PERCENT: 10.2 %
MEAN CORPUSCULAR HEMOGLOBIN CONC: 33.8 g/dL (ref 32.0–36.0)
MEAN CORPUSCULAR HEMOGLOBIN: 30.5 pg (ref 25.9–32.4)
MEAN CORPUSCULAR VOLUME: 90.2 fL (ref 77.6–95.7)
MEAN PLATELET VOLUME: 7.8 fL (ref 6.8–10.7)
MONOCYTES ABSOLUTE COUNT: 0.6 10*9/L (ref 0.3–0.8)
MONOCYTES RELATIVE PERCENT: 5.1 %
NEUTROPHILS ABSOLUTE COUNT: 9.3 10*9/L — ABNORMAL HIGH (ref 1.8–7.8)
NEUTROPHILS RELATIVE PERCENT: 83.5 %
PLATELET COUNT: 353 10*9/L (ref 150–450)
RED BLOOD CELL COUNT: 3.47 10*12/L — ABNORMAL LOW (ref 4.26–5.60)
RED CELL DISTRIBUTION WIDTH: 14.1 % (ref 12.2–15.2)
WBC ADJUSTED: 11.1 10*9/L (ref 3.6–11.2)

## 2023-04-22 LAB — PHOSPHORUS: PHOSPHORUS: 3.7 mg/dL (ref 2.4–5.1)

## 2023-04-22 MED ADMIN — HYDROmorphone (PF) (DILAUDID) injection 1 mg: 1 mg | INTRAVENOUS | @ 21:00:00 | Stop: 2023-05-05

## 2023-04-22 MED ADMIN — lidocaine (ASPERCREME) 4 % 1 patch: 1 | TRANSDERMAL | @ 12:00:00

## 2023-04-22 MED ADMIN — sodium chloride (NS) 0.9 % flush 10 mL: 10 mL | INTRAVENOUS | @ 05:00:00

## 2023-04-22 MED ADMIN — sodium chloride (NS) 0.9 % flush 10 mL: 10 mL | INTRAVENOUS | @ 12:00:00

## 2023-04-22 MED ADMIN — HYDROmorphone (PF) (DILAUDID) injection 1 mg: 1 mg | INTRAVENOUS | @ 13:00:00 | Stop: 2023-05-05

## 2023-04-22 MED ADMIN — amlodipine (NORVASC) tablet 5 mg: 5 mg | ORAL | @ 12:00:00

## 2023-04-22 MED ADMIN — tamsulosin (FLOMAX) 24 hr capsule 0.4 mg: .4 mg | ORAL | @ 12:00:00

## 2023-04-22 MED ADMIN — acetaminophen (TYLENOL) tablet 1,000 mg: 1000 mg | ORAL | @ 19:00:00

## 2023-04-22 MED ADMIN — ketorolac (TORADOL) injection 15 mg: 15 mg | INTRAVENOUS | @ 05:00:00 | Stop: 2023-04-24

## 2023-04-22 MED ADMIN — HYDROmorphone (PF) (DILAUDID) injection 1 mg: 1 mg | INTRAVENOUS | @ 16:00:00 | Stop: 2023-05-05

## 2023-04-22 MED ADMIN — oxyCODONE (ROXICODONE) 5 mg/5 mL solution 10 mg: 10 mg | ORAL | @ 15:00:00 | Stop: 2023-05-03

## 2023-04-22 MED ADMIN — oxyCODONE (ROXICODONE) 5 mg/5 mL solution 10 mg: 10 mg | ORAL | @ 09:00:00 | Stop: 2023-05-03

## 2023-04-22 MED ADMIN — ketorolac (TORADOL) injection 15 mg: 15 mg | INTRAVENOUS | @ 22:00:00 | Stop: 2023-04-24

## 2023-04-22 MED ADMIN — levothyroxine (SYNTHROID) tablet 150 mcg: 150 ug | ORAL | @ 12:00:00

## 2023-04-22 MED ADMIN — oxyCODONE (ROXICODONE) 5 mg/5 mL solution 10 mg: 10 mg | ORAL | @ 19:00:00 | Stop: 2023-05-03

## 2023-04-22 MED ADMIN — fat emulsion 20 % with fish oil (SMOFlipid) infusion 250 mL: 250 mL | INTRAVENOUS | @ 03:00:00 | Stop: 2023-04-22

## 2023-04-22 MED ADMIN — enoxaparin (LOVENOX) syringe 40 mg: 40 mg | SUBCUTANEOUS | @ 05:00:00

## 2023-04-22 MED ADMIN — acetaminophen (TYLENOL) tablet 1,000 mg: 1000 mg | ORAL | @ 03:00:00

## 2023-04-22 MED ADMIN — acetaminophen (TYLENOL) tablet 1,000 mg: 1000 mg | ORAL | @ 09:00:00

## 2023-04-22 MED ADMIN — ketorolac (TORADOL) injection 15 mg: 15 mg | INTRAVENOUS | @ 16:00:00 | Stop: 2023-04-24

## 2023-04-22 MED ADMIN — sodium chloride (NS) 0.9 % flush 10 mL: 10 mL | INTRAVENOUS | @ 22:00:00

## 2023-04-22 MED ADMIN — HYDROmorphone (PF) (DILAUDID) injection 1 mg: 1 mg | INTRAVENOUS | Stop: 2023-05-05

## 2023-04-22 MED ADMIN — amitriptyline (ELAVIL) tablet 100 mg: 100 mg | ORAL

## 2023-04-22 MED ADMIN — Parenteral Nutrition (CENTRAL): INTRAVENOUS | @ 03:00:00 | Stop: 2023-04-22

## 2023-04-22 MED ADMIN — metoPROLOL tartrate (Lopressor) tablet 100 mg: 100 mg | ORAL | @ 12:00:00

## 2023-04-22 MED ADMIN — oxyCODONE (ROXICODONE) 5 mg/5 mL solution 10 mg: 10 mg | ORAL | @ 03:00:00 | Stop: 2023-05-03

## 2023-04-22 MED ADMIN — pantoprazole (Protonix) injection 40 mg: 40 mg | INTRAVENOUS | @ 12:00:00

## 2023-04-22 MED ADMIN — HYDROmorphone (PF) (DILAUDID) injection 1 mg: 1 mg | INTRAVENOUS | @ 05:00:00 | Stop: 2023-05-05

## 2023-04-22 MED ADMIN — ketorolac (TORADOL) injection 15 mg: 15 mg | INTRAVENOUS | @ 09:00:00 | Stop: 2023-04-24

## 2023-04-22 NOTE — Unmapped (Cosign Needed)
 Surgery Progress Note  6 Days [facility] Service: SRA - GI 1 [address]) [phone]  Admitting Attending: Surgical Oncology Attendings: Daryl Eastern, DO      Assessment:     Sergio Perez is a 62 y.o. 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     Now s/p ex lap, small bowel resection, new ileocolic side-to-side anastomosis and omentectomy on 04/16/23. 45fr malecot G tube in place.     Interval Events:     NAEON. VSS. GT output 1.2L with improving abdominal pain compared to yesterday. Mild nausea, no vomiting. UOP adequate. BM x1. Ambulating.     Plan:       Neuro/Pain:   *Hx of CVA   - Holding home Plavix   *Pain/Nausea   - Scheduled IV Tylenol   - PRN IV HM, PO liquid oxy    - Toradol 15mg  q6hr scheduled    - Compazine prn    - Chronic pain c/s - appreciate recs    - Home amitriptyline sch       CV: VSS and normotensive  *Hx of HTN, HLD  - Home metoprolol 100mg  BID and amlodipine   - Holding home fenofibrate, atorvastatin       Pulm: NWOB and on RA      GI  - F: TPN  - E: replete PRN  - N: CLD  - Protonix, PRN tums     *malignant SBO s/p ex-lap, ileocolic anastomotic resection (9/13)  - GT output 1.2L   - Vent G-tube prn for pain, nausea        Endo:   *Hx of hypothyroidism  - Home levothyroxine      Renal/GU:   - Passed TOV  - Flomax 0.4mg  daily       Heme/ID:   - Daily CBC   - Lovenox DVT ppx      Dispo: Floor    Please page SRA - GI 1 [address] Gowda, [place]) [phone] with any questions.       Objective:      Vital Signs:  BP 160/103  - Pulse 92  - Temp 36.6 ??C (97.9 ??F) (Oral)  - Resp 18  - Ht 182.9 cm (6')  - Wt 88.5 kg (195 lb)  - SpO2 98%  - BMI 26.45 kg/m??     Input/Output:  I/O         09/17 0701  09/18 0700 09/18 0701  09/19 0700 09/19 0701  09/20 0700    P.O. 600 0 60    I.V. (mL/kg)       IV Piggyback       TPN 1561.5  253.4    Total Intake 2161.5 0 313.4    Urine (mL/kg/hr) 800 (0.4) 550 (0.3) 0 (0)    Emesis/NG output 400 1200 0    Stool 0 0 0    Total Output(mL/kg) 1200 (13.6) 1750 (19.8) 0 (0)    Net +961.5 -1750 +313.4           Urine Occurrence 3 x 1 x     Stool Occurrence 5 x 1 x 0 x  Emesis Occurrence 0 x  0 x            Physical Exam:    General: Cooperative, no distress, well appearing male  Neuro: Alert, oriented, appropriate  HEENT: Normocephalic, atraumatic  Pulmonary: Normal work of breathing, equal bilateral chest rise  Cardiovascular: HDS, appears well perfused   Abdomen: softer, improving distension, mildly tender  Extremities: no peripheral edema    Labs:    Lab Results   Component Value Date    WBC 11.1 04/22/2023    HGB 10.6 (L) 04/22/2023    HCT 31.3 (L) 04/22/2023    PLT 353 04/22/2023       Lab Results   Component Value Date    NA 137 04/22/2023    K 4.3 04/22/2023    CL 106 04/22/2023    CO2 24.0 04/22/2023    BUN 19 04/22/2023    CREATININE 0.54 (L) 04/22/2023    CALCIUM 8.8 04/22/2023    MG 1.8 04/22/2023    PHOS 3.7 04/22/2023       Microbiology Results (last day)       ** No results found for the last 24 hours. **            Imaging:   CT A/P from OSH 9/10:   -Multiple dilated loops of small bowel, with somewhat gradual transition to decompressed small bowel in the right lower quadrant. Findings raise a concern for developing partial small bowel obstruction.  -Peritoneum and omental carcinomatosis with increasing loculated appearing inferior perihepatic and perisplenic ascites.  -2.5 cm enhancing lesion in segment 6 of the liver, stable in size.    Sidonie Dickens, MD  Surgical Oncology, PGY-1  Pager: [phone]

## 2023-04-22 NOTE — Unmapped (Signed)
Admitted for RT hemicolectomy. Pt has moderate/severe pain. Meds given See MAR. Denies nausea sx. Has 24 Fr. G-tube clamped upon med admin for 30-45 minutes, pt educated. On clear liquid diet, tolerating well. Has midline incision. On room air. On Lipids/TPN. Has adequate urine output. Has PICC LT basilic vein. Will resume all orders as written. Call light is within reach.       Problem: Adult Inpatient Plan of Care  Goal: Plan of Care Review  Outcome: Progressing  Goal: Patient-Specific Goal (Individualized)  Outcome: Not Progressing  Goal: Absence of Hospital-Acquired Illness or Injury  Outcome: Not Progressing  Intervention: Prevent Skin Injury  Recent Flowsheet Documentation  Taken 04/22/2023 0800 by Eartha Inch, RN  Positioning for Skin: (in bed) Supine/Back  Intervention: Prevent and Manage VTE (Venous Thromboembolism) Risk  Recent Flowsheet Documentation  Taken 04/22/2023 0800 by Eartha Inch, RN  VTE Prevention/Management:   ambulation promoted   anticoagulant therapy  Anti-Embolism Device Type: SCD, Knee  Anti-Embolism Intervention: Off  Anti-Embolism Device Location: BLE  Goal: Optimal Comfort and Wellbeing  Outcome: Not Progressing  Goal: Readiness for Transition of Care  Outcome: Not Progressing  Goal: Rounds/Family Conference  Outcome: Not Progressing     Problem: Malnutrition  Goal: Improved Nutritional Intake  Outcome: Not Progressing     Problem: Wound  Goal: Optimal Coping  Outcome: Not Progressing  Goal: Optimal Functional Ability  Outcome: Not Progressing  Goal: Absence of Infection Signs and Symptoms  Outcome: Not Progressing  Goal: Improved Oral Intake  Outcome: Not Progressing  Goal: Optimal Pain Control and Function  Outcome: Not Progressing  Goal: Skin Health and Integrity  Outcome: Not Progressing  Goal: Optimal Wound Healing  Outcome: Not Progressing     Problem: Fall Injury Risk  Goal: Absence of Fall and Fall-Related Injury  Outcome: Not Progressing     Problem: Nausea and Vomiting  Goal: Nausea and Vomiting Relief  Outcome: Not Progressing     Problem: Nausea and Vomiting  Goal: Nausea and Vomiting Relief  Outcome: Not Progressing     Problem: Pain Acute  Goal: Optimal Pain Control and Function  Outcome: Not Progressing     Problem: Comorbidity Management  Goal: Blood Pressure in Desired Range  Outcome: Not Progressing     Problem: Surgery Nonspecified  Goal: Absence of Bleeding  Outcome: Not Progressing  Goal: Effective Bowel Elimination  Outcome: Not Progressing  Goal: Fluid and Electrolyte Balance  Outcome: Not Progressing  Goal: Blood Glucose Level Within Targeted Range  Outcome: Not Progressing  Goal: Absence of Infection Signs and Symptoms  Outcome: Not Progressing  Goal: Anesthesia/Sedation Recovery  Outcome: Not Progressing  Intervention: Optimize Anesthesia Recovery  Recent Flowsheet Documentation  Taken 04/22/2023 0800 by Eartha Inch, RN  Administration (IS): (encouraged) self-administered  Goal: Optimal Pain Control and Function  Outcome: Not Progressing  Goal: Nausea and Vomiting Relief  Outcome: Not Progressing  Goal: Effective Urinary Elimination  Outcome: Not Progressing  Goal: Effective Oxygenation and Ventilation  Outcome: Not Progressing

## 2023-04-22 NOTE — Unmapped (Signed)
Adult Nutrition Progress Note    Visit Type: Follow-Up  Reason for Visit: Parenteral Nutrition    NUTRITION ASSESSMENT     Patient appropriate for parenteral nutrition based on the following ASPEN criteria of bowel obstruction, peri-op nutrition support, s/p OR 9/13 ongoing NPO/CLD status. .     NUTRITION INTERVENTIONS and RECOMMENDATION     Renew TPN daily  Daily BMP, mag and phos until stable on TPN   TPN to be cylced to 12 hours tonight       Nutrition Progress: TPN initiated 9/12. Patient s/p s/p ex lap, small bowel resection, new ileocolic side-to-side anastomosis and omentectomy on 04/16/23. Diet advanced to clear liquids 9/17, patient reports sipping on small amounts of liquids but endorses some GI discomfort at time of visit this afternoon. TPN continues, planned to cycle to 12 hours tonight.     Nutrition History:   April 15, 2023:   Prior to admission: Patient reports poor/minimal PO intakes starting 9/9 related to nausea/vomiting. He reports prior to this appetite has been low over the past year.     Anthropometric Data:  Height: 182.9 cm (6') 182.9 cm (6')  as of 10/29/2022   Admission weight: 91.2 kg (201 lb)  Last recorded weight: 88.5 kg (195 lb)  IBW: 80.79 kg  Percent IBW: 109.48 %  BMI: Body mass index is 26.45 kg/m??.   Usual Body Weight:  217 lb    Weight history prior to admission:  Patient reports weight loss as low as 170 lb over the past year PTA. He feels last recorded weight of 215 lb is not accurate (? Fluid related/abd distention vs scale discrepancy)     Wt Readings from Last 10 Encounters:   04/20/23 88.5 kg (195 lb)   03/18/23 91.5 kg (201 lb 12.8 oz)   03/04/23 90.2 kg (198 lb 14.4 oz)   02/18/23 90.2 kg (198 lb 15.4 oz)   02/05/23 90.8 kg (200 lb 3.2 oz)   01/21/23 89.7 kg (197 lb 10.3 oz)   01/07/23 90.6 kg (199 lb 13.6 oz)   12/24/22 89.7 kg (197 lb 12.8 oz)   12/10/22 88.6 kg (195 lb 4.8 oz)   11/26/22 87.3 kg (192 lb 7.4 oz)        Weight changes this admission:   Last 5 Recorded Weights    04/15/23 0546 04/17/23 0545 04/18/23 0600 04/20/23 0557   Weight: 97.9 kg (215 lb 13.3 oz) 88.8 kg (195 lb 12.3 oz) 88.7 kg (195 lb 8.8 oz) 91.7 kg (202 lb 2.6 oz)    04/20/23 1000   Weight: 88.5 kg (195 lb)        Malnutrition Assessment using AND/ASPEN or GLIM Clinical Characteristics:  GLIM Moderate Malnutrition (04/15/23 1729)  Reduced Muscle Mass: Mild to moderate deficit  Reduced Food Intake: Any chronic gastrointestinal condition that adversely impacts food assimilation or absorption  Inflammation: Chronic disease related    Nutrition Focused Physical Exam:  Fat Areas Examined  Orbital: Moderate loss  Upper Arm: Severe loss  Thoracic: Mild loss      Muscle Areas Examined  Temple: Mild loss  Clavicle: Moderate loss  Acromion: Moderate loss  Scapular: Moderate loss  Dorsal Hand: Moderate loss  Patellar: Mild loss  Anterior Thigh: Mild loss  Posterior Calf: No loss    Nutrition Evaluation  Overall Impressions: Moderate fat loss, Mild muscle loss (04/15/23 1728)    Care plan:Yes     NUTRITIONALLY RELEVANT DATA     Medications:  Nutritionally pertinent medications reviewed and evaluated for potential food and/or medication interactions and include pantoprazole   TPN Order:   Recent TPN Orders (Show up to 1 orders ; newest on the right.)       Start date and time  04/22/2023 2200       Parenteral Nutrition (CENTRAL) [[id number]]       linked to   fat emulsion 20 % with fish oil (SMOFlipid) infusion 250 mL     Order Status Active     Frequency Continuous        Macro Ingredients    amino acid (CLINISOL SF) 15% 136 g     dextrose 280 g     fat emulsion 20 % with fish oil 250 mL *        Electrolytes    sodium chloride 110 mEq     potassium phosphate 51 mmol     magnesium sulfate dilution 24 mEq     calcium gluconate 5 mEq        Additives    multivitamin, adult injection 10 mL     trace elements (TRALEMENT) Zn-Cu-Mn-Se 1 mL        Medications    thiamine 180 mg        QS Base    sterile water 53.46 mL        Energy Contribution    Proteins 544 kcal     Dextrose 952 kcal     Lipids 500 kcal *     Total 1,996 kcal        Electrolyte Ion Calculated Amount    Sodium 110 mEq     Potassium 74.8 mEq     Calcium 5 mEq     Magnesium 24 mEq     Aluminum --     Phosphate 51 mmol     Chloride 110 mEq     Acetate --     Chloride: Acetate Ratio --        Trace Elements    Copper 0.3 mg     Manganese 55 mcg     Selenium 60 mcg     Zinc 3 mg        Other    Total Amino Acid 136 g     Total Amino Acid/kg 1.54 g/kg     Glucose Infusion Rate 4.39 mg/kg/min     Osmolarity 2,101.56     Volume 1,440 mL     Rate 120 mL/hr     Dosing Weight 88.5 kg     Infusion Site Central     Total Multi-vitamins 10 mL        Admin Instructions      Please infuse using a parenteral nutrition tubing set (non-DEHP) with a 1.2-micron filter. Listed in [facility] as Set IV for Parenteral Nutrition # [id number].  central line only           * Lipid contribution from the linked fat emulsion order. Any non-lipid contribution from the associated lipid order is not included.          Labs:   Lab Results   Component Value Date    NA 137 04/22/2023    K 4.3 04/22/2023    CL 106 04/22/2023    CO2 24.0 04/22/2023    BUN 19 04/22/2023    CREATININE 0.54 (L) 04/22/2023    GLU 103 04/22/2023    CALCIUM 8.8 04/22/2023    ALBUMIN 3.2 (L) 04/16/2023    PHOS  3.7 04/22/2023      Recent Labs     Units 04/22/23  0527   MG mg/dL 1.8       Lab Results   Component Value Date    ALKPHOS 60 04/16/2023    BILITOT 0.2 (L) 04/16/2023    BILIDIR 0.10 04/16/2023    PROT 6.8 04/16/2023    ALBUMIN 3.2 (L) 04/16/2023    ALT 9 (L) 04/16/2023    AST 14 04/16/2023      Recent Labs     Units 04/16/23  0410   TRIG mg/dL 147*        Current Nutrition:  Oral intake   Parenteral nutrition via PICC   Placement Date: 04/15/23  Placed by VAT team     Nutrition Orders            Parenteral Nutrition (CENTRAL) at 120 mL/hr starting at 09/19 2200    Parenteral Nutrition (CENTRAL) at 60 mL/hr starting at 09/18 2200    Nutrition Therapy Clear Liquid starting at 09/17 1132          Nutritional Needs:   Daily Estimated Nutrient Needs:  Energy: 1980-2250 kcals 22-25 kcal/kg (x 1-1.2 AF) using estimated dry weight, 90 kg (04/15/23 1438)]  Protein: 108-135 gm [1.2-1.5 gm/kg using estimated dry weight, 90 kg (04/15/23 1438)]  Carbohydrate:   [45-60% of kcal]  Fluid:   mL [1 mL/kcal (maintenance)]    Allergies, Intolerances, Sensitivities, and/or Cultural/Religious Dietary Restrictions: none identified at this time     GOALS and EVALUATION     Patient to meet 60% or greater of nutritional needs via parenteral nutrition within admit.  - Meeting/Ongoing    Motivation, Barriers, and Compliance:  Evaluation of motivation, barriers, and compliance completed. No concerns identified at this time.     Discharge Planning:   Monitor for potential discharge needs with multi-disciplinary team.     Follow-Up Parameters:   Monitor daily while on TPN    [facility] MS, RD, LD, CNSC  [phone]

## 2023-04-22 NOTE — Unmapped (Signed)
Pt Sergio Perez, VSS on RA. Pt c/o of surgical pain and was managed by PRN. Surgical site appear clean, dry and intact. Voiding well. G-tube unclamped and attached to foley bag as per MD. On clear liquid diet but poor intake. On TPN and Fat emulsion. L arm PICC in place, dressing clean, dry and intact. BM this shift. No other needs identified. Plan of care reviewed.   Problem: Adult Inpatient Plan of Care  Goal: Plan of Care Review  Outcome: Progressing  Goal: Patient-Specific Goal (Individualized)  Outcome: Progressing  Goal: Absence of Hospital-Acquired Illness or Injury  Outcome: Progressing  Intervention: Identify and Manage Fall Risk  Recent Flowsheet Documentation  Taken 04/21/2023 0800 by Sinclair Grooms, RN  Safety Interventions:   lighting adjusted for tasks/safety   low bed   environmental modification   commode/urinal/bedpan at bedside  Intervention: Prevent Skin Injury  Recent Flowsheet Documentation  Taken 04/21/2023 0800 by Sinclair Grooms, RN  Positioning for Skin: Supine/Back  Device Skin Pressure Protection: absorbent pad utilized/changed  Intervention: Prevent and Manage VTE (Venous Thromboembolism) Risk  Recent Flowsheet Documentation  Taken 04/21/2023 0800 by Sinclair Grooms, RN  VTE Prevention/Management: ambulation promoted  Anti-Embolism Device Type: SCD, Knee  Anti-Embolism Intervention: Refused  Goal: Optimal Comfort and Wellbeing  Outcome: Progressing  Goal: Readiness for Transition of Care  Outcome: Progressing  Goal: Rounds/Family Conference  Outcome: Progressing     Problem: Malnutrition  Goal: Improved Nutritional Intake  Outcome: Progressing     Problem: Wound  Goal: Optimal Coping  Outcome: Progressing  Goal: Optimal Functional Ability  Outcome: Progressing  Goal: Absence of Infection Signs and Symptoms  Outcome: Progressing  Goal: Improved Oral Intake  Outcome: Progressing  Goal: Optimal Pain Control and Function  Outcome: Progressing  Goal: Skin Health and Integrity  Outcome: Progressing  Intervention: Optimize Skin Protection  Recent Flowsheet Documentation  Taken 04/21/2023 0800 by Sinclair Grooms, RN  Pressure Reduction Techniques: frequent weight shift encouraged  Pressure Reduction Devices: pressure-redistributing mattress utilized  Goal: Optimal Wound Healing  Outcome: Progressing     Problem: Fall Injury Risk  Goal: Absence of Fall and Fall-Related Injury  Outcome: Progressing  Intervention: Promote Scientist, clinical (histocompatibility and immunogenetics) Documentation  Taken 04/21/2023 0800 by Sinclair Grooms, RN  Safety Interventions:   lighting adjusted for tasks/safety   low bed   environmental modification   commode/urinal/bedpan at bedside     Problem: Nausea and Vomiting  Goal: Nausea and Vomiting Relief  Outcome: Progressing     Problem: Nausea and Vomiting  Goal: Nausea and Vomiting Relief  Outcome: Progressing     Problem: Pain Acute  Goal: Optimal Pain Control and Function  Outcome: Progressing     Problem: Comorbidity Management  Goal: Blood Pressure in Desired Range  Outcome: Progressing     Problem: Surgery Nonspecified  Goal: Absence of Bleeding  Outcome: Progressing  Goal: Effective Bowel Elimination  Outcome: Progressing  Goal: Fluid and Electrolyte Balance  Outcome: Progressing  Goal: Blood Glucose Level Within Targeted Range  Outcome: Progressing  Goal: Absence of Infection Signs and Symptoms  Outcome: Progressing  Goal: Anesthesia/Sedation Recovery  Outcome: Progressing  Intervention: Optimize Anesthesia Recovery  Recent Flowsheet Documentation  Taken 04/21/2023 0800 by Sinclair Grooms, RN  Safety Interventions:   lighting adjusted for tasks/safety   low bed   environmental modification   commode/urinal/bedpan at bedside  Goal: Optimal Pain Control and Function  Outcome: Progressing  Goal: Nausea and Vomiting Relief  Outcome: Progressing  Goal: Effective Urinary Elimination  Outcome: Progressing  Goal: Effective Oxygenation and Ventilation  Outcome: Progressing

## 2023-04-22 NOTE — Unmapped (Cosign Needed)
 Surgery Progress Note  5 Days Post-Op  Hospital Service: [facility] - [facility] [address]) [phone]  Admitting Attending: Surgical Oncology Attendings: Daryl Eastern, DO      Assessment:     Sergio Perez is a 62 y.o. 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     Now s/p ex lap, small bowel resection and omentectomy on 04/16/23.     Interval Events:     NAEON. Tachycardia overnight, but normotensive, did not get dosage of metoprolol. Increased abdominal pain yesterday after eating some chicken broth. Having multiple loose BM and passing some flatus. Mild nausea, no vomiting.  UOP 800cc total. Feels like his pain regimen is adequate, and would like to trial venting G-tube as needed.     Plan:       Neuro/Pain:   *Hx of CVA   - Holding home Plavix   *Pain/Nausea   - Scheduled IV Tylenol   - Discontinue dilaudid PCA, PRN IV HM, PO liquid oxy    - Toradol 15mg  q6hr scheduled    - Compazine prn    - Chronic pain c/s - appreciate recs       CV: VSS and normotensive  *Hx of HTN, HLD  - Home metoprolol 100mg  BID   - Holding home fenofibrate, atorvastatin       Pulm: NWOB and on RA      GI  - F: TPN  - E: replete PRN  - N: CLD  - Vent G-tube prn for pain, nausea  - PPI    - Protonix, PRN tums       Endo:   *Hx of hypothyroidism  - Home levothyroxine      Renal/GU:   - Passed TOV  - Flomax 0.4mg  daily       Heme/ID:   - Daily CBC   - Lovenox DVT ppx      Dispo: Floor    Please page [facility] - [facility] [address], [place]) [phone] with any questions. Objective:      Vital Signs:  BP 172/98  - Pulse 103  - Temp 36.8 ??C (98.2 ??F) (Oral)  - Resp 16  - Ht 182.9 cm (6')  - Wt 88.5 kg (195 lb)  - SpO2 97%  - BMI 26.45 kg/m??     Input/Output:  I/O         09/16 0701  09/17 0700 09/17 0701  09/18 0700 09/18 0701  09/19 0700    P.O. 50 600 0    I.V. (mL/kg) 741.6 (8.1)      IV Piggyback 334      TPN 1765.6 1561.5     Total Intake 2891.2 2161.5 0    Urine (mL/kg/hr) 250 (0.1) 800 (0.4) 0 (0)    Emesis/NG output 1025 400 1200    Stool 1 0 0    Total Output(mL/kg) 1276 (13.9) 1200 (13.6) 1200 (13.6)    Net +1615.2 +961.5 -1200           Urine Occurrence 1 x 3 x 1 x    Stool Occurrence 1 x 5 x 1 x  Emesis Occurrence  0 x             Physical Exam:    General: Cooperative, no distress, well appearing male  Neuro: Alert, oriented, appropriate  HEENT: Normocephalic, atraumatic  Pulmonary: Normal work of breathing, equal bilateral chest rise  Cardiovascular: Regular rate, appears well perfused   Abdomen: softer, improved distension, mildly tender  Extremities: no peripheral edema    Labs:    Lab Results   Component Value Date    WBC 7.4 04/21/2023    HGB 11.1 (L) 04/21/2023    HCT 33.1 (L) 04/21/2023    PLT 365 04/21/2023       Lab Results   Component Value Date    NA 137 04/21/2023    K 4.4 04/21/2023    CL 105 04/21/2023    CO2 23.0 04/21/2023    BUN 17 04/21/2023    CREATININE 0.50 (L) 04/21/2023    CALCIUM 8.8 04/21/2023    MG 1.7 04/21/2023    PHOS 3.3 04/21/2023       Microbiology Results (last day)       ** No results found for the last 24 hours. **            Imaging:   CT A/P from OSH 9/10:   -Multiple dilated loops of small bowel, with somewhat gradual transition to decompressed small bowel in the right lower quadrant. Findings raise a concern for developing partial small bowel obstruction.  -Peritoneum and omental carcinomatosis with increasing loculated appearing inferior perihepatic and perisplenic ascites.  -2.5 cm enhancing lesion in segment 6 of the liver, stable in size.      Karna Christmas, MD   PGY-1 General Surgery

## 2023-04-22 NOTE — Unmapped (Signed)
VSS A&OX4 on rm air, surgical site CDI. Drain site CDI to straight drain for comfort. UOP via urinal, no BM noted. Tolerating diet well no n/v noted. TPN restarted at MN. Pain managed with prn pain meds, see MAR. All needs met. Bed low/locked, call bell within reach. POC reviewed and continued.  Problem: Adult Inpatient Plan of Care  Goal: Plan of Care Review  Outcome: Progressing  Goal: Patient-Specific Goal (Individualized)  Outcome: Progressing  Goal: Absence of Hospital-Acquired Illness or Injury  Outcome: Progressing  Goal: Optimal Comfort and Wellbeing  Outcome: Progressing  Goal: Readiness for Transition of Care  Outcome: Progressing  Goal: Rounds/Family Conference  Outcome: Progressing

## 2023-04-23 LAB — BASIC METABOLIC PANEL
ANION GAP: 7 mmol/L (ref 5–14)
BLOOD UREA NITROGEN: 23 mg/dL (ref 9–23)
BUN / CREAT RATIO: 42
CALCIUM: 9 mg/dL (ref 8.7–10.4)
CHLORIDE: 105 mmol/L (ref 98–107)
CO2: 22 mmol/L (ref 20.0–31.0)
CREATININE: 0.55 mg/dL — ABNORMAL LOW
EGFR CKD-EPI (2021) MALE: >90 mL/min/{1.73_m2} (ref >=60)
GLUCOSE RANDOM: 119 mg/dL (ref 70–179)
POTASSIUM: 4.5 mmol/L (ref 3.4–4.8)
SODIUM: 134 mmol/L — ABNORMAL LOW (ref 135–145)

## 2023-04-23 LAB — MAGNESIUM: MAGNESIUM: 2 mg/dL (ref 1.6–2.6)

## 2023-04-23 LAB — TRIGLYCERIDES: TRIGLYCERIDES: 216 mg/dL — ABNORMAL HIGH (ref 0–150)

## 2023-04-23 LAB — HEPATIC FUNCTION PANEL
ALBUMIN: 2.5 g/dL — ABNORMAL LOW (ref 3.4–5.0)
ALKALINE PHOSPHATASE: 113 U/L (ref 46–116)
ALT (SGPT): 12 U/L (ref 10–49)
AST (SGOT): 14 U/L (ref <=34)
BILIRUBIN DIRECT: <0.1 mg/dL (ref 0.00–0.30)
BILIRUBIN TOTAL: 0.2 mg/dL — ABNORMAL LOW (ref 0.3–1.2)
PROTEIN TOTAL: 6.7 g/dL (ref 5.7–8.2)

## 2023-04-23 LAB — PHOSPHORUS: PHOSPHORUS: 4.4 mg/dL (ref 2.4–5.1)

## 2023-04-23 MED ADMIN — oxyCODONE (ROXICODONE) 5 mg/5 mL solution 10 mg: 10 mg | ORAL | @ 04:00:00 | Stop: 2023-05-03

## 2023-04-23 MED ADMIN — sodium chloride (NS) 0.9 % flush 10 mL: 10 mL | INTRAVENOUS | @ 12:00:00

## 2023-04-23 MED ADMIN — HYDROmorphone (PF) (DILAUDID) injection 1 mg: 1 mg | INTRAVENOUS | @ 20:00:00 | Stop: 2023-05-05

## 2023-04-23 MED ADMIN — fat emulsion 20 % with fish oil (SMOFlipid) infusion 250 mL: 250 mL | INTRAVENOUS | @ 02:00:00 | Stop: 2023-04-23

## 2023-04-23 MED ADMIN — acetaminophen (TYLENOL) tablet 1,000 mg: 1000 mg | ORAL | @ 03:00:00

## 2023-04-23 MED ADMIN — acetaminophen (TYLENOL) tablet 1,000 mg: 1000 mg | ORAL | @ 10:00:00

## 2023-04-23 MED ADMIN — amlodipine (NORVASC) tablet 5 mg: 5 mg | ORAL | @ 12:00:00

## 2023-04-23 MED ADMIN — ketorolac (TORADOL) injection 15 mg: 15 mg | INTRAVENOUS | @ 15:00:00 | Stop: 2023-04-24

## 2023-04-23 MED ADMIN — tamsulosin (FLOMAX) 24 hr capsule 0.4 mg: .4 mg | ORAL | @ 12:00:00

## 2023-04-23 MED ADMIN — metoPROLOL tartrate (Lopressor) tablet 100 mg: 100 mg | ORAL | @ 01:00:00

## 2023-04-23 MED ADMIN — lidocaine (ASPERCREME) 4 % 1 patch: 1 | TRANSDERMAL | @ 12:00:00

## 2023-04-23 MED ADMIN — ondansetron (ZOFRAN) injection 4 mg: 4 mg | INTRAVENOUS | @ 09:00:00

## 2023-04-23 MED ADMIN — oxyCODONE (ROXICODONE) 5 mg/5 mL solution 10 mg: 10 mg | ORAL | @ 12:00:00 | Stop: 2023-04-23

## 2023-04-23 MED ADMIN — acetaminophen (TYLENOL) tablet 1,000 mg: 1000 mg | ORAL | @ 17:00:00

## 2023-04-23 MED ADMIN — sodium chloride (NS) 0.9 % flush 10 mL: 10 mL | INTRAVENOUS | @ 20:00:00

## 2023-04-23 MED ADMIN — ketorolac (TORADOL) injection 15 mg: 15 mg | INTRAVENOUS | @ 10:00:00 | Stop: 2023-04-24

## 2023-04-23 MED ADMIN — oxyCODONE (ROXICODONE) 5 mg/5 mL solution 10 mg: 10 mg | ORAL | @ 17:00:00 | Stop: 2023-04-23

## 2023-04-23 MED ADMIN — HYDROmorphone (PF) (DILAUDID) injection 1 mg: 1 mg | INTRAVENOUS | @ 15:00:00 | Stop: 2023-05-05

## 2023-04-23 MED ADMIN — HYDROmorphone (PF) (DILAUDID) injection 1 mg: 1 mg | INTRAVENOUS | @ 09:00:00 | Stop: 2023-05-05

## 2023-04-23 MED ADMIN — enoxaparin (LOVENOX) syringe 40 mg: 40 mg | SUBCUTANEOUS | @ 04:00:00

## 2023-04-23 MED ADMIN — ketorolac (TORADOL) injection 15 mg: 15 mg | INTRAVENOUS | @ 22:00:00 | Stop: 2023-04-24

## 2023-04-23 MED ADMIN — amitriptyline (ELAVIL) tablet 100 mg: 100 mg | ORAL | @ 01:00:00

## 2023-04-23 MED ADMIN — ketorolac (TORADOL) injection 15 mg: 15 mg | INTRAVENOUS | @ 04:00:00 | Stop: 2023-04-24

## 2023-04-23 MED ADMIN — metoPROLOL tartrate (Lopressor) tablet 100 mg: 100 mg | ORAL | @ 12:00:00

## 2023-04-23 MED ADMIN — sodium chloride (NS) 0.9 % flush 10 mL: 10 mL | INTRAVENOUS | @ 04:00:00

## 2023-04-23 MED ADMIN — Parenteral Nutrition (CENTRAL): INTRAVENOUS | @ 02:00:00 | Stop: 2023-04-23

## 2023-04-23 MED ADMIN — levothyroxine (SYNTHROID) tablet 150 mcg: 150 ug | ORAL | @ 12:00:00

## 2023-04-23 MED ADMIN — HYDROmorphone (PF) (DILAUDID) injection 1 mg: 1 mg | INTRAVENOUS | @ 01:00:00 | Stop: 2023-05-05

## 2023-04-23 MED ADMIN — pantoprazole (Protonix) injection 40 mg: 40 mg | INTRAVENOUS | @ 12:00:00

## 2023-04-23 NOTE — Unmapped (Cosign Needed)
 Surgery Progress Note  7 Days [facility] Service: [facility] - GI [facility] [address]) [phone]  Admitting Attending: Surgical Oncology Attendings: Daryl Eastern, DO      Assessment:     Sergio Perez is a 62 y.o. 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     Now s/p ex lap, small bowel resection, new ileocolic side-to-side anastomosis and omentectomy on 04/16/23. 38fr malecot G tube in place.     Interval Events:     NAEON. VSS. GT output 525cc. Had better day yesterday with improved pain after trying some vegetable broth. Voiding and having multiple loose BM, sometimes at same time. Pain controlled on current regimen. Ambulating. Understands to vent G-tube as needed for pain, nausea. Yesterday during the day his G-tube was primarily clamped.     Plan:       Neuro/Pain:   *Hx of CVA   - Holding home Plavix   *Pain/Nausea   - Scheduled IV Tylenol   - PRN IV HM, PO liquid oxy    - Toradol 15mg  q6hr scheduled    - Compazine prn    - Chronic pain c/s - appreciate recs    - Home amitriptyline sch       CV: VSS and normotensive  *Hx of HTN, HLD  - Home metoprolol 100mg  BID and amlodipine   - Holding home fenofibrate, atorvastatin       Pulm: NWOB and on RA      GI  - F: TPN  - E: replete PRN  - N: CLD  - Protonix, PRN tums     *malignant SBO s/p ex-lap, ileocolic anastomotic resection (9/13)  - GT output 575cc  - Continue to vent G-tube prn for pain, nausea        Endo:   *Hx of hypothyroidism  - Home levothyroxine      Renal/GU:   - Passed TOV  - Flomax 0.4mg  daily       Heme/ID:   - Daily CBC   - Lovenox DVT ppx      Dispo: Floor    Please page [facility] - [facility] [address], [place]) [phone] with any questions.       Objective:      Vital Signs:  BP 134/86  - Pulse 93  - Temp 36.6 ??C (97.9 ??F) (Oral)  - Resp 17  - Ht 182.9 cm (6')  - Wt 88.5 kg (195 lb)  - SpO2 95%  - BMI 26.45 kg/m??     Input/Output:  I/O         09/18 0701  09/19 0700 09/19 0701  09/20 0700 09/20 0701  09/21 0700    P.O. 0 180     TPN  1496.4     Total Intake 0 1676.4     Urine (mL/kg/hr) 550 (0.3) 300 (0.1)     Emesis/NG output 1200 575     Stool 0 0     Total Output(mL/kg) 1750 (19.8) 875 (9.9)     Net -1750 +801.4            Urine Occurrence 1 x 1  x     Stool Occurrence 1 x 0 x     Emesis Occurrence  0 x             Physical Exam:    General: Cooperative, no distress, well appearing male  Neuro: Alert, oriented, appropriate  HEENT: Normocephalic, atraumatic  Pulmonary: Normal work of breathing, equal bilateral chest rise  Cardiovascular: HDS, appears well perfused   Abdomen: continues to feel softer with improving distension, non-tender, G-tube with green bilious output  Extremities: no peripheral edema    Labs:    Lab Results   Component Value Date    WBC 11.1 04/22/2023    HGB 10.6 (L) 04/22/2023    HCT 31.3 (L) 04/22/2023    PLT 353 04/22/2023       Lab Results   Component Value Date    NA 137 04/22/2023    K 4.3 04/22/2023    CL 106 04/22/2023    CO2 24.0 04/22/2023    BUN 19 04/22/2023    CREATININE 0.54 (L) 04/22/2023    CALCIUM 8.8 04/22/2023    MG 1.8 04/22/2023    PHOS 3.7 04/22/2023       Microbiology Results (last day)       ** No results found for the last 24 hours. **            Imaging:   CT A/P from OSH 9/10:   -Multiple dilated loops of small bowel, with somewhat gradual transition to decompressed small bowel in the right lower quadrant. Findings raise a concern for developing partial small bowel obstruction.  -Peritoneum and omental carcinomatosis with increasing loculated appearing inferior perihepatic and perisplenic ascites.  -2.5 cm enhancing lesion in segment 6 of the liver, stable in size.    Karna Christmas, MD   PGY-1 General Surgery

## 2023-04-23 NOTE — Unmapped (Signed)
Hoa remains adherent to treatment/medication regimen.  Pain remains a constant issues (oral and IV prn medications administered accordingly).  A&Ox4, afebrile, blood pressure were elevated but stable.      Problem: Adult Inpatient Plan of Care  Goal: Plan of Care Review  Outcome: Progressing  Goal: Patient-Specific Goal (Individualized)  Outcome: Progressing  Goal: Absence of Hospital-Acquired Illness or Injury  Outcome: Progressing  Intervention: Identify and Manage Fall Risk  Recent Flowsheet Documentation  Taken 04/23/2023 0600 by Ennis Forts, RN  Safety Interventions: low bed  Taken 04/23/2023 0400 by Ennis Forts, RN  Safety Interventions: low bed  Taken 04/23/2023 0200 by Ennis Forts, RN  Safety Interventions: low bed  Taken 04/23/2023 0000 by Ennis Forts, RN  Safety Interventions: low bed  Taken 04/22/2023 2200 by Ennis Forts, RN  Safety Interventions: low bed  Taken 04/22/2023 2015 by Ennis Forts, RN  Safety Interventions:   low bed   nonskid shoes/slippers when out of bed   room near unit station   fall reduction program maintained  Goal: Optimal Comfort and Wellbeing  Outcome: Progressing  Goal: Readiness for Transition of Care  Outcome: Progressing  Goal: Rounds/Family Conference  Outcome: Progressing     Problem: Malnutrition  Goal: Improved Nutritional Intake  Outcome: Progressing     Problem: Wound  Goal: Optimal Coping  Outcome: Progressing  Goal: Optimal Functional Ability  Outcome: Progressing  Goal: Absence of Infection Signs and Symptoms  Outcome: Progressing  Goal: Improved Oral Intake  Outcome: Progressing  Goal: Optimal Pain Control and Function  Outcome: Progressing  Goal: Skin Health and Integrity  Outcome: Progressing  Goal: Optimal Wound Healing  Outcome: Progressing     Problem: Fall Injury Risk  Goal: Absence of Fall and Fall-Related Injury  Outcome: Progressing  Intervention: Promote Injury-Free Environment  Recent Flowsheet Documentation  Taken 04/23/2023 0600 by Ennis Forts, RN  Safety Interventions: low bed  Taken 04/23/2023 0400 by Ennis Forts, RN  Safety Interventions: low bed  Taken 04/23/2023 0200 by Ennis Forts, RN  Safety Interventions: low bed  Taken 04/23/2023 0000 by Ennis Forts, RN  Safety Interventions: low bed  Taken 04/22/2023 2200 by Ennis Forts, RN  Safety Interventions: low bed  Taken 04/22/2023 2015 by Ennis Forts, RN  Safety Interventions:   low bed   nonskid shoes/slippers when out of bed   room near unit station   fall reduction program maintained     Problem: Nausea and Vomiting  Goal: Nausea and Vomiting Relief  Outcome: Progressing     Problem: Nausea and Vomiting  Goal: Nausea and Vomiting Relief  Outcome: Progressing     Problem: Pain Acute  Goal: Optimal Pain Control and Function  Outcome: Progressing     Problem: Comorbidity Management  Goal: Blood Pressure in Desired Range  Outcome: Progressing     Problem: Surgery Nonspecified  Goal: Absence of Bleeding  Outcome: Progressing  Goal: Effective Bowel Elimination  Outcome: Progressing  Goal: Fluid and Electrolyte Balance  Outcome: Progressing  Goal: Blood Glucose Level Within Targeted Range  Outcome: Progressing  Goal: Absence of Infection Signs and Symptoms  Outcome: Progressing  Goal: Anesthesia/Sedation Recovery  Outcome: Progressing  Intervention: Optimize Anesthesia Recovery  Recent Flowsheet Documentation  Taken 04/23/2023 0600 by Ennis Forts, RN  Safety Interventions: low bed  Taken 04/23/2023 0400 by Ennis Forts, RN  Safety Interventions: low bed  Taken 04/23/2023 0200 by Ennis Forts, RN  Safety Interventions: low bed  Taken 04/23/2023 0000 by Ennis Forts, RN  Safety Interventions: low bed  Taken 04/22/2023 2200 by Ennis Forts, RN  Safety Interventions: low bed  Taken 04/22/2023 2015 by Ennis Forts, RN  Safety Interventions:   low bed   nonskid shoes/slippers when out of bed   room near unit station fall reduction program maintained  Goal: Optimal Pain Control and Function  Outcome: Progressing  Goal: Nausea and Vomiting Relief  Outcome: Progressing  Goal: Effective Urinary Elimination  Outcome: Progressing  Goal: Effective Oxygenation and Ventilation  Outcome: Progressing

## 2023-04-24 LAB — BASIC METABOLIC PANEL
ANION GAP: 4 mmol/L — ABNORMAL LOW (ref 5–14)
BLOOD UREA NITROGEN: 28 mg/dL — ABNORMAL HIGH (ref 9–23)
BUN / CREAT RATIO: 46
CALCIUM: 8.9 mg/dL (ref 8.7–10.4)
CHLORIDE: 104 mmol/L (ref 98–107)
CO2: 26 mmol/L (ref 20.0–31.0)
CREATININE: 0.61 mg/dL — ABNORMAL LOW
EGFR CKD-EPI (2021) MALE: >90 mL/min/{1.73_m2} (ref >=60)
GLUCOSE RANDOM: 107 mg/dL (ref 70–179)
POTASSIUM: 4.4 mmol/L (ref 3.4–4.8)
SODIUM: 134 mmol/L — ABNORMAL LOW (ref 135–145)

## 2023-04-24 LAB — PHOSPHORUS: PHOSPHORUS: 4.9 mg/dL (ref 2.4–5.1)

## 2023-04-24 LAB — MAGNESIUM: MAGNESIUM: 2.1 mg/dL (ref 1.6–2.6)

## 2023-04-24 MED ADMIN — sodium chloride (NS) 0.9 % flush 10 mL: 10 mL | INTRAVENOUS | @ 04:00:00

## 2023-04-24 MED ADMIN — pantoprazole (Protonix) injection 40 mg: 40 mg | INTRAVENOUS | @ 13:00:00

## 2023-04-24 MED ADMIN — HYDROmorphone (PF) (DILAUDID) injection 1 mg: 1 mg | INTRAVENOUS | @ 08:00:00 | Stop: 2023-05-05

## 2023-04-24 MED ADMIN — sodium chloride (NS) 0.9 % flush 10 mL: 10 mL | INTRAVENOUS | @ 13:00:00

## 2023-04-24 MED ADMIN — lidocaine (ASPERCREME) 4 % 1 patch: 1 | TRANSDERMAL | @ 13:00:00

## 2023-04-24 MED ADMIN — ketorolac (TORADOL) injection 15 mg: 15 mg | INTRAVENOUS | @ 04:00:00 | Stop: 2023-04-24

## 2023-04-24 MED ADMIN — amitriptyline (ELAVIL) tablet 100 mg: 100 mg | ORAL

## 2023-04-24 MED ADMIN — acetaminophen (TYLENOL) tablet 1,000 mg: 1000 mg | ORAL | @ 18:00:00

## 2023-04-24 MED ADMIN — ondansetron (ZOFRAN) injection 4 mg: 4 mg | INTRAVENOUS | @ 04:00:00

## 2023-04-24 MED ADMIN — metoPROLOL tartrate (Lopressor) tablet 100 mg: 100 mg | ORAL

## 2023-04-24 MED ADMIN — oxyCODONE (ROXICODONE) 5 mg/5 mL solution 10 mg: 10 mg | ORAL | @ 02:00:00 | Stop: 2023-04-30

## 2023-04-24 MED ADMIN — ondansetron (ZOFRAN) injection 4 mg: 4 mg | INTRAVENOUS | @ 13:00:00

## 2023-04-24 MED ADMIN — methocarbamol (ROBAXIN) 500 mg in sodium chloride (NS) 0.9 % 50 mL IVPB: 500 mg | INTRAVENOUS | @ 08:00:00 | Stop: 2023-04-24

## 2023-04-24 MED ADMIN — fat emulsion 20 % with fish oil (SMOFlipid) infusion 250 mL: 250 mL | INTRAVENOUS | @ 02:00:00 | Stop: 2023-04-24

## 2023-04-24 MED ADMIN — oxyCODONE (ROXICODONE) 5 mg/5 mL solution 10 mg: 10 mg | ORAL | @ 15:00:00 | Stop: 2023-04-24

## 2023-04-24 MED ADMIN — HYDROmorphone (DILAUDID) tablet 4 mg: 4 mg | ORAL | @ 23:00:00 | Stop: 2023-05-08

## 2023-04-24 MED ADMIN — sodium chloride (NS) 0.9 % flush 10 mL: 10 mL | INTRAVENOUS | @ 20:00:00

## 2023-04-24 MED ADMIN — ketorolac (TORADOL) injection 15 mg: 15 mg | INTRAVENOUS | @ 10:00:00 | Stop: 2023-04-24

## 2023-04-24 MED ADMIN — methocarbamol (ROBAXIN) 750 mg in sodium chloride (NS) 0.9 % 50 mL IVPB: 750 mg | INTRAVENOUS | @ 17:00:00

## 2023-04-24 MED ADMIN — metoPROLOL tartrate (Lopressor) tablet 100 mg: 100 mg | ORAL | @ 13:00:00

## 2023-04-24 MED ADMIN — methocarbamol (ROBAXIN) 500 mg in sodium chloride (NS) 0.9 % 50 mL IVPB: 500 mg | INTRAVENOUS | @ 02:00:00

## 2023-04-24 MED ADMIN — HYDROmorphone (PF) (DILAUDID) injection 1 mg: 1 mg | INTRAVENOUS | Stop: 2023-05-05

## 2023-04-24 MED ADMIN — HYDROmorphone (DILAUDID) tablet 4 mg: 4 mg | ORAL | @ 17:00:00 | Stop: 2023-05-08

## 2023-04-24 MED ADMIN — acetaminophen (TYLENOL) tablet 1,000 mg: 1000 mg | ORAL | @ 10:00:00

## 2023-04-24 MED ADMIN — HYDROmorphone (PF) (DILAUDID) injection 1 mg: 1 mg | INTRAVENOUS | @ 20:00:00 | Stop: 2023-05-05

## 2023-04-24 MED ADMIN — HYDROmorphone (PF) (DILAUDID) injection 1 mg: 1 mg | INTRAVENOUS | @ 13:00:00 | Stop: 2023-05-05

## 2023-04-24 MED ADMIN — HYDROmorphone (PF) (DILAUDID) injection 1 mg: 1 mg | INTRAVENOUS | @ 04:00:00 | Stop: 2023-05-05

## 2023-04-24 MED ADMIN — tamsulosin (FLOMAX) 24 hr capsule 0.4 mg: .4 mg | ORAL | @ 13:00:00

## 2023-04-24 MED ADMIN — oxyCODONE (ROXICODONE) 5 mg/5 mL solution 5 mg: 5 mg | ORAL | @ 10:00:00 | Stop: 2023-04-24

## 2023-04-24 MED ADMIN — enoxaparin (LOVENOX) syringe 40 mg: 40 mg | SUBCUTANEOUS | @ 04:00:00

## 2023-04-24 MED ADMIN — Parenteral Nutrition (CENTRAL): INTRAVENOUS | @ 02:00:00 | Stop: 2023-04-24

## 2023-04-24 MED ADMIN — levothyroxine (SYNTHROID) tablet 150 mcg: 150 ug | ORAL | @ 13:00:00

## 2023-04-24 MED ADMIN — amlodipine (NORVASC) tablet 5 mg: 5 mg | ORAL | @ 13:00:00

## 2023-04-24 NOTE — Unmapped (Signed)
Pt remains A&Ox4 on RA. Pt experiencing consistent pain around abdomen. Pain controlled with scheduled pain medicine, along with prn oxy and dilaudid. Pt unclamped G-tube himself overnight. G-tube drained green and tan output. 1x administration of zofran for nausea. TPN and Lipids running per order. VSS. Afebrile. Pt states he has felt better than he has in days. Pt rested adequately overnight. No falls or events occurred.     Problem: Wound  Goal: Optimal Pain Control and Function  Outcome: Ongoing - Unchanged     Problem: Pain Acute  Goal: Optimal Pain Control and Function  Outcome: Ongoing - Unchanged     Problem: Adult Inpatient Plan of Care  Goal: Plan of Care Review  Outcome: Progressing  Goal: Patient-Specific Goal (Individualized)  Outcome: Progressing  Goal: Absence of Hospital-Acquired Illness or Injury  Outcome: Progressing  Intervention: Identify and Manage Fall Risk  Recent Flowsheet Documentation  Taken 04/23/2023 1950 by Langley Adie, RN  Safety Interventions:   commode/urinal/bedpan at bedside   fall reduction program maintained   environmental modification   lighting adjusted for tasks/safety   low bed   nonskid shoes/slippers when out of bed   no IV/BP/blood draw left arm  Intervention: Prevent Skin Injury  Recent Flowsheet Documentation  Taken 04/23/2023 1950 by Langley Adie, RN  Positioning for Skin: Supine/Back  Device Skin Pressure Protection:   absorbent pad utilized/changed   tubing/devices free from skin contact  Skin Protection:   adhesive use limited   incontinence pads utilized   tubing/devices free from skin contact  Intervention: Prevent and Manage VTE (Venous Thromboembolism) Risk  Recent Flowsheet Documentation  Taken 04/24/2023 0421 by Langley Adie, RN  Anti-Embolism Device Type: SCD, Knee  Anti-Embolism Intervention: Refused  Anti-Embolism Device Location: BLE  Taken 04/24/2023 0240 by Langley Adie, RN  Anti-Embolism Device Type: SCD, Knee  Anti-Embolism Intervention: Refused  Anti-Embolism Device Location: BLE  Taken 04/24/2023 0027 by Langley Adie, RN  Anti-Embolism Device Type: SCD, Knee  Anti-Embolism Intervention: Refused  Anti-Embolism Device Location: BLE  Taken 04/23/2023 2207 by Langley Adie, RN  Anti-Embolism Device Type: SCD, Knee  Anti-Embolism Intervention: Refused  Anti-Embolism Device Location: BLE  Taken 04/23/2023 2022 by Langley Adie, RN  VTE Prevention/Management:   ambulation promoted   anticoagulant therapy   bleeding precautions maintained   bleeding risk factors identified   dorsiflexion/plantar flexion performed   fluids promoted  Anti-Embolism Device Type: SCD, Knee  Anti-Embolism Intervention: Refused  Anti-Embolism Device Location: BLE  Taken 04/23/2023 1950 by Langley Adie, RN  Anti-Embolism Device Type: SCD, Knee  Anti-Embolism Intervention: Refused  Anti-Embolism Device Location: BLE  Goal: Optimal Comfort and Wellbeing  Outcome: Progressing  Goal: Readiness for Transition of Care  Outcome: Progressing  Goal: Rounds/Family Conference  Outcome: Progressing     Problem: Malnutrition  Goal: Improved Nutritional Intake  Outcome: Progressing     Problem: Wound  Goal: Optimal Coping  Outcome: Progressing  Goal: Optimal Functional Ability  Outcome: Progressing  Intervention: Optimize Functional Ability  Recent Flowsheet Documentation  Taken 04/24/2023 0421 by Langley Adie, RN  Activity Management:   ambulated to bathroom   back to bed  Goal: Absence of Infection Signs and Symptoms  Outcome: Progressing  Goal: Improved Oral Intake  Outcome: Progressing  Goal: Skin Health and Integrity  Outcome: Progressing  Intervention: Optimize Skin Protection  Recent Flowsheet Documentation  Taken 04/24/2023 0421 by Langley Adie, RN  Activity Management:   ambulated to bathroom   back to bed  Taken 04/23/2023 1950 by  Langley Adie, RN  Pressure Reduction Techniques: frequent weight shift encouraged  Pressure Reduction Devices: pressure-redistributing mattress utilized  Skin Protection:   adhesive use limited   incontinence pads utilized   tubing/devices free from skin contact  Goal: Optimal Wound Healing  Outcome: Progressing     Problem: Fall Injury Risk  Goal: Absence of Fall and Fall-Related Injury  Outcome: Progressing  Intervention: Promote Scientist, clinical (histocompatibility and immunogenetics) Documentation  Taken 04/23/2023 1950 by Langley Adie, RN  Safety Interventions:   commode/urinal/bedpan at bedside   fall reduction program maintained   environmental modification   lighting adjusted for tasks/safety   low bed   nonskid shoes/slippers when out of bed   no IV/BP/blood draw left arm     Problem: Nausea and Vomiting  Goal: Nausea and Vomiting Relief  Outcome: Progressing     Problem: Nausea and Vomiting  Goal: Nausea and Vomiting Relief  Outcome: Progressing     Problem: Comorbidity Management  Goal: Blood Pressure in Desired Range  Outcome: Progressing     Problem: Surgery Nonspecified  Goal: Absence of Bleeding  Outcome: Progressing  Goal: Effective Bowel Elimination  Outcome: Progressing  Goal: Fluid and Electrolyte Balance  Outcome: Progressing  Goal: Blood Glucose Level Within Targeted Range  Outcome: Progressing  Goal: Absence of Infection Signs and Symptoms  Outcome: Progressing  Goal: Anesthesia/Sedation Recovery  Outcome: Progressing  Intervention: Optimize Anesthesia Recovery  Recent Flowsheet Documentation  Taken 04/23/2023 1950 by Langley Adie, RN  Safety Interventions:   commode/urinal/bedpan at bedside   fall reduction program maintained   environmental modification   lighting adjusted for tasks/safety   low bed   nonskid shoes/slippers when out of bed   no IV/BP/blood draw left arm  Goal: Optimal Pain Control and Function  Outcome: Progressing  Goal: Nausea and Vomiting Relief  Outcome: Progressing  Goal: Effective Urinary Elimination  Outcome: Progressing  Goal: Effective Oxygenation and Ventilation  Outcome: Progressing

## 2023-04-24 NOTE — Unmapped (Signed)
=  Department of Anesthesiology  Pain Medicine Division    Chronic Pain in-patient Consult follow-up Note      Requesting Attending Physician:  Lynnell Dike, DO  Service Requesting Consult:  Sur Oncology [facility])    Assessment/Recommendations:  The patient was seen in consultation on request of Lynnell Dike, DO regarding assistance with pain management. The patient is obtaining adequate pain relief on current medication regimen.     The patient is a 62 y.o. 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      Now s/p ex lap, small bowel resection and omentectomy on 04/16/23.     Interval:   We were asked to see the patient again today by primary admitting team as his pain is not again optimally controlled on current regimen of IV Dilaudid and oral oxycodone.  When we went to see the patient this morning patient states that IV Dilaudid seems to have helped his pain however oral oxycodone seem to have decreased effect anymore.  He is having bowel movement he is not having any major side effect from the medications.    Discussed with the patient about options including opioid rotation switching from oral oxycodone to oral Dilaudid patient very much in agreement with that.    Also discussed with the patient about potentially consulting palliative care team for his progressive metastatic burden and intractable pain.  However at this point patient would like to hold off with this.    Home regimen:    Oxycodone 5 mg every 6 hours as needed    Recommendations:  -The chronic pain service is a consult service and does not place orders, just makes recommendations (except ketamine and lidocaine infusions)  -Please evaluate all patients on opioids for appropriateness of prescribing narcan at discharge.  The chronic pain service can assist with this.  Nasal narcan covered by most insurance.  -Recommendations given apply to the current hospitalization and do not reflect long term recommendations.    -We recommend to  -Continue Dilaudid 0.5-1 mg every 4 hours as needed severe pain  -Discontinue oxycodone 5-10 mg  -Start oral hydromorphone 4 mg p.o. every 4 hours as needed moderate pain  -Continue Tylenol 1 g 3 times daily  -Continue Robaxin-750 milligram every 8 hours  -Completed IV Toradol previously but can add oral NSAIDs short term if okay by primary team  -Consider consulting palliative team when patient ready.  Patient not agreeable to consult with palliative team today.     We will continue to see the patient.  Plan was discussed with the primary team agreeable with the plan    Prescribe naloxone at discharge.  Nasal narcan for most insured (Nasal narcan 4mg /actuation, prescribe 1 kit, instructions at [email or web address]).  For uninsured, chronic pain can work to assist in finding an option.  OTC nasal narcan now available at most pharmacies for around $45.    History of Present Illness:    Reason for Consult: Acute postoperative pain, chronic pain syndrome, history of colon cancer    When we went to see him today patient says that his pain is doing better with his  oral pain medication.  Patient thought that he has been using Dilaudid PCA however he has not had any dose delivered to him since 11 AM earlier today.  He saw him at around 4 PM.  We discussed with the patient about discontinuing his PCA pump.  Patient is agreeable with the same.  Has no other new pain.    Pt had 1st BM today. No abd distension. No vomiting. No fever.     Prior to admission, the patient was on home opiate medications.  Hehas been followed by a pain clinic.  The [place] CSRS was reviewed.    Allergies    No Known Allergies      Home Medications    Medications Prior to Admission   Medication Sig Dispense Refill Last Dose    acetaminophen (TYLENOL) 500 MG tablet Take 2 tablets (1,000 mg total) by mouth every twelve (12) hours as needed for pain.       amitriptyline (ELAVIL) 100 MG tablet Take 1 tablet (100 mg total) by mouth nightly. Frequency:QHSPRN   Dosage:50   MG  Instructions:  Note:Dose: 50MG        amLODIPine (NORVASC) 10 MG tablet        atorvastatin (LIPITOR) 40 MG tablet Take 1 tablet (40 mg total) by mouth daily.       clopidogreL (PLAVIX) 75 mg tablet clopidogrel 75 mg tablet   TAKE 1 TABLET BY MOUTH ONCE DAILY       diphenoxylate-atropine (LOMOTIL) 2.5-0.025 mg per tablet Take 1 tablet by mouth 4 times daily 30 tablet 0     doxycycline (MONODOX) 100 MG capsule Take 1 capsule (100 mg total) by mouth two (2) times a day. 60 capsule 5     fenofibrate (LOFIBRA) 160 MG tablet Take 1 tablet (160 mg total) by mouth daily.       fluoride, sodium, (DENTAGEL) 1.1 % Gel Apply 1 application topically two (2) times a day. 56 g 7     gabapentin (NEURONTIN) 400 MG capsule 1 in the AM; 1 at lunch and 3 at bedtime       levothyroxine (SYNTHROID, LEVOTHROID) 150 MCG tablet Take 1 tablet (150 mcg total) by mouth daily. 30 tablet 3     loperamide (IMODIUM A-D) 2 mg tablet Take 2 tablets (4 mg) by mouth for first loose stool followed by 1 tablet (2 mg) after each loose stool thereafter (maximum of 8 tablets per day). 30 tablet 11     magnesium oxide (MAG-OX) 400 mg (241.3 mg elemental magnesium) tablet Take 1 tablet (400 mg total) by mouth two (2) times a day. 60 tablet 11     metoprolol tartrate (LOPRESSOR) 100 MG tablet 1 tablet (100 mg total) two (2) times a day.       MITIGARE 0.6 mg cap capsule        ondansetron (ZOFRAN) 8 MG tablet Take 1 tablet (8 mg total) by mouth every eight (8) hours as needed for nausea. Do not take on day 1 of chemotherapy cycles. 60 tablet 3     oxyCODONE (ROXICODONE) 5 MG immediate release tablet Take 1 tablet (5 mg total) by mouth every six (6) hours as needed for pain. 90 tablet 0     pantoprazole (PROTONIX) 40 MG tablet Take 1 tablet (40 mg total) by mouth daily.       prochlorperazine (COMPAZINE) 10 MG tablet Take 1 tablet (10 mg total) by mouth every six (6) hours as needed (Nausea & Vomiting). 60  tablet 3     trifluridine-tipiracil (LONSURF) 15-6.14 mg tablet Take 5 tablets (75 mg total) by mouth in the morning and 5 tablets (75 mg total) in the evening. Take with meals. Take for 5 days on, 9 days off for every 14 day cycle. 100 tablet 6        Inpatient Medications  Current Facility-Administered Medications   Medication Dose Route Frequency Provider Last Rate Last Admin    acetaminophen (TYLENOL) tablet 1,000 mg  1,000 mg Oral Q8H Harris, Lurline Del, FNP   1,000 mg at 04/24/23 0546    amitriptyline (ELAVIL) tablet 100 mg  100 mg Oral Nightly Kirke Shaggy, FNP   100 mg at 04/23/23 2015    amlodipine (NORVASC) tablet 5 mg  5 mg Oral Daily Kirke Shaggy, FNP   5 mg at 04/24/23 0845    calcium carbonate (TUMS) chewable tablet 200 mg elem calcium  200 mg elem calcium Oral TID PRN Karna Christmas, MD   200 mg elem calcium at 04/20/23 1638    enoxaparin (LOVENOX) syringe 40 mg  40 mg Subcutaneous Q24H Emilio Aspen, MD   40 mg at 04/24/23 0013    Parenteral Nutrition (CENTRAL)   Intravenous Continuous Montel Culver, MD        And    fat emulsion 20 % with fish oil (SMOFlipid) infusion 250 mL  250 mL Intravenous Continuous Kadakia, Esha R, MD        HYDROmorphone (PF) injection Syrg 0.5 mg  0.5 mg Intravenous Q4H PRN Karna Christmas, MD   0.5 mg at 04/21/23 1501    Or    HYDROmorphone (PF) (DILAUDID) injection 1 mg  1 mg Intravenous Q4H PRN Karna Christmas, MD   1 mg at 04/24/23 0836    hydrOXYzine (ATARAX) tablet 25 mg  25 mg Oral Q4H PRN Karna Christmas, MD   25 mg at 04/18/23 1610    levothyroxine (SYNTHROID) tablet 150 mcg  150 mcg Oral Daily Pricilla Larsson E, MD   150 mcg at 04/24/23 0845    lidocaine (ASPERCREME) 4 % 1 patch  1 patch Transdermal Daily Sidonie Dickens R, MD   1 patch at 04/24/23 0857    methocarbamol (ROBAXIN) 750 mg in sodium chloride (NS) 0.9 % 50 mL IVPB  750 mg Intravenous [facility] Algie Coffer, Esha R, MD        metoclopramide (REGLAN) injection 5 mg  5 mg Intravenous Q6H PRN Harris, Lurline Del, FNP        metoPROLOL tartrate (Lopressor) tablet 100 mg  100 mg Oral BID Kirke Shaggy, FNP   100 mg at 04/24/23 0847    ondansetron (ZOFRAN) injection 4 mg  4 mg Intravenous Q6H PRN Emilio Aspen, MD   4 mg at 04/24/23 0836    oxyCODONE (ROXICODONE) 5 mg/5 mL solution 5 mg  5 mg Oral Q3H PRN Karna Christmas, MD   5 mg at 04/24/23 0546    Or    oxyCODONE (ROXICODONE) 5 mg/5 mL solution 10 mg  10 mg Oral Q3H PRN Karna Christmas, MD   10 mg at 04/23/23 2145    pantoprazole (Protonix) injection 40 mg  40 mg Intravenous Daily Emilio Aspen, MD   40 mg at 04/24/23 0845    Parenteral Nutrition (CENTRAL)   Intravenous Continuous Karna Christmas, MD 150 mL/hr at 04/23/23 2142 New Bag at 04/23/23 2142    prochlorperazine (COMPAZINE) injection 5 mg  5 mg Intravenous Q6H PRN Atkins,  Dierdre Harness, MD   5 mg at 04/17/23 0800    sodium chloride (NS) 0.9 % flush 10 mL  10 mL Intravenous Q8H Emilio Aspen, MD   10 mL at 04/23/23 1625    sodium chloride (NS) 0.9 % flush 10 mL  10 mL Intravenous Q8H Emilio Aspen, MD   10 mL at 04/24/23 0857    tamsulosin (FLOMAX) 24 hr capsule 0.4 mg  0.4 mg Oral Daily Emilio Aspen, MD   0.4 mg at 04/24/23 0845         Past Medical History    Past Medical History:   Diagnosis Date    Hypothyroidism     Squamous cell carcinoma of base of tongue (CMS-HCC) 11/2008           Past Surgical History    Past Surgical History:   Procedure Laterality Date    chemoradiotherapy  2010    IR INSERT PORT AGE GREATER THAN 5 YRS  08/19/2022 IR INSERT PORT AGE GREATER THAN 5 YRS 08/19/2022 Evron, Gerri Lins, PA IMG VIR [facility]    laser resection      NECK DISSECTION      PR EXPLORATORY OF ABDOMEN N/A 04/16/2023    Procedure: EXPLORATORY LAPAROTOMY, EXPLORATORY CELIOTOMY WITH OR WITHOUT BIOPSY(S);  Surgeon: Lynnell Dike, DO;  Location: OR [facility];  Service: Surgical Oncology    PR REMOVAL OF OMENTUM N/A 04/16/2023    Procedure: OMENTECTOMY, EPIPLOECTOMY, RESECTION OF OMENTUM;  Surgeon: Lynnell Dike, DO;  Location: OR [facility];  Service: Surgical Oncology    PR RESECT SMALL INTEST,SINGL RESEC/ANAS N/A 04/16/2023    Procedure: ENTERECTOMY SM INTES; SNGL RESECT & ANASTOM;  Surgeon: Lynnell Dike, DO;  Location: OR [facility];  Service: Surgical Oncology           Family History    Family History   Problem Relation Age of Onset    Cancer Brother         Brain         Social History:  Social History     Tobacco Use   Smoking Status Former    Current packs/day: 0.00    Average packs/day: 1 pack/day for 7.0 years (7.0 ttl pk-yrs)    Types: Cigarettes    Start date: 05/16/2003    Quit date: 05/15/2010    Years since quitting: 12.9   Smokeless Tobacco Never   Tobacco Comments    Quit smoking 16 years ago     Social History     Substance and Sexual Activity   Alcohol Use Yes    Alcohol/week: 35.0 standard drinks of alcohol    Types: 35 Cans of beer per week     Social History     Substance and Sexual Activity   Drug Use Yes    Frequency: 7.0 times per week    Types: Marijuana    Comment: rolled cigarette of MJ         Lab Results   Component Value Date    CREATININE 0.61 (L) 04/24/2023       Lab Results   Component Value Date    ALKPHOS 113 04/23/2023    BILITOT 0.2 (L) 04/23/2023    BILIDIR <0.10 04/23/2023    PROT 6.7 04/23/2023    ALBUMIN 2.5 (L) 04/23/2023    ALT 12 04/23/2023    AST 14 04/23/2023       Encounter Date: 04/14/23   ECG 12 Lead   Result Value  EKG Systolic BP     EKG Diastolic BP     EKG Ventricular Rate 130    EKG Atrial Rate 130    EKG P-R Interval 134    EKG QRS Duration 84    EKG Q-T Interval 314    EKG QTC Calculation 462    EKG Calculated P Axis 14    EKG Calculated R Axis 12    EKG Calculated T Axis 31    QTC Fredericia 406    Narrative    SINUS TACHYCARDIA  OTHERWISE NORMAL ECG  NO PREVIOUS ECGS AVAILABLE  Confirmed by Mariane Baumgarten ([id number]) on 04/16/2023 9:48:15 PM       No results found for requested labs within last 30 days.       Objective:     Vital Signs  Temp:  [36.4 ??C (97.5 ??F)-36.8 ??C (98.2 ??F)] (P) 36.5 ??C (97.7 ??F)  Heart Rate:  [84-105] 90  Resp:  [16-18] (P) 17  BP: (119-159)/(68-97) 159/89  MAP (mmHg):  [84-111] 106  SpO2:  [95 %-99 %] (P) 98 %    Physical Exam    GENERAL:  Well developed, well-nourished male and is in no apparent distress.   HEAD/NECK:    Reveals normocephalic/atraumatic.   CARDIOVASCULAR:   Regular rate  LUNGS:   Normal work of breathing, no supplemental 02  EXTREMITIES:  Warm, no clubbing, cyanosis, or edema was noted.  NEUROLOGIC:    The patient was alert and oriented times four with normal language, attention, cognition and memory. Cranial nerve exam was grossly normal.   MUSCULOSKELETAL:    Patient ambulatory.  SKIN:  No obvious rashes lesions or erythema  PSY:  Appropriate affect and mood.      Problem List    Malignant bowel obstruction  Chronic pain syndrome

## 2023-04-24 NOTE — Unmapped (Signed)
 Surgery Progress Note  8 Days [facility] Service: SRA - GI 1 [address]) [phone]  Admitting Attending: Surgical Oncology Attendings: Daryl Eastern, DO      Assessment:     Sergio Perez is a 62 y.o. 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     Now s/p ex lap, small bowel resection, new ileocolic side-to-side anastomosis and omentectomy on 04/16/23. 70fr malecot G tube in place.     Interval Events:     NAEON. VSS, mildly tachycardic to 100s overnight. Bilious GT output 1.25L. Still complains of nausea. Vents GT on his own, leaves his GT to drain for most of the day, including when taking PO intake. +Flatus, BM.     Pain continues to be an issue. Per patient the oxycodone is not working for him. Re-engaged chronic pain team.     Plan:       Neuro/Pain:   *Hx of CVA   - Holding home Plavix   *Pain/Nausea   - Scheduled IV Tylenol, robaxin, lido patches    - PRN PO dilaudid for moderate pain, IV HM for severe pain   - Home amitriptyline sch    - Compazine prn   - Chronic pain re-engaged- appreciate recs - can add short-term course of ibuprofen if needed    CV: VSS and normotensive  *Hx of HTN, HLD  - Home metoprolol 100mg  BID and amlodipine   - Holding home fenofibrate, atorvastatin       Pulm: NWOB and on RA      GI  - F: TPN  - E: replete PRN  - N: CLD  - Protonix, PRN tums     *malignant SBO s/p ex-lap, ileocolic anastomotic resection (9/13)  - Bilious GT output 1.25L  - Vent G-tube prn for pain, nausea - leaves GT to drain most of the day per patient  - +Flatus, BM      Endo:   *Hx of hypothyroidism  - Home levothyroxine      Renal/GU:   - Passed TOV  - Flomax 0.4mg  daily       Heme/ID:   - Daily CBC   - Lovenox DVT ppx      Dispo: Floor    Please page SRA - GI 1 [address] Gowda, [place]) [phone] with any questions.     Attending Attestation:    I supervised the resident for the history and exam, and conducted pertinent aspects of the history and exam after the initial review. I discussed the findings, assessment and plan with the resident and agree with the findings and plan as documented in the resident???s note on the date listed above.        Objective:      Vital Signs:  BP 159/89  - Pulse 90  - Temp 36.5 ??C (97.7 ??F) (Oral)  - Resp 18  - Ht 182.9 cm (6')  - Wt 91.9 kg (202 lb 9.6 oz)  - SpO2 99%  - BMI 27.48 kg/m??     Input/Output:  I/O         09/19 0701  09/20 0700 09/20 0701  09/21 0700 09/21 0701  09/22 0700    P.O. 180 900 210    IV Piggyback  124     TPN 1496.4 1799.9     Total Intake 1676.4 2823.9 210    Urine (mL/kg/hr) 300 (0.1) 675 (0.3) 0 (0)    Emesis/NG output 575 1750     Stool 0 0 0    Total Output(mL/kg) 875 (9.9) 2425 (26.4) 0 (0)    Net +801.4 +398.9 +210           Urine Occurrence 1 x 2 x 0 x    Stool Occurrence 0 x 1 x 0 x    Emesis Occurrence 0 x 0 x             Physical Exam:    General: Cooperative, no distress, well appearing male  Neuro: Alert, oriented, appropriate  HEENT: Normocephalic, atraumatic  Pulmonary: Normal work of breathing, equal bilateral chest rise on RA  Cardiovascular: HDS, appears well perfused   Abdomen: distended, mildly tender on the right, G-tube with green bilious output  Extremities: no peripheral edema    Labs:    Lab Results   Component Value Date    WBC 11.1 04/22/2023    HGB 10.6 (L) 04/22/2023    HCT 31.3 (L) 04/22/2023    PLT 353 04/22/2023       Lab Results   Component Value Date    NA 134 (L) 04/24/2023    K 4.4 04/24/2023    CL 104 04/24/2023    CO2 26.0 04/24/2023    BUN 28 (H) 04/24/2023    CREATININE 0.61 (L) 04/24/2023    CALCIUM 8.9 04/24/2023    MG 2.1 04/24/2023    PHOS 4.9 04/24/2023       Microbiology Results (last day)       ** No results found for the last 24 hours. **            Imaging:   CT A/P from OSH 9/10:   -Multiple dilated loops of small bowel, with somewhat gradual transition to decompressed small bowel in the right lower quadrant. Findings raise a concern for developing partial small bowel obstruction.  -Peritoneum and omental carcinomatosis with increasing loculated appearing inferior perihepatic and perisplenic ascites.  -2.5 cm enhancing lesion in segment 6 of the liver, stable in size.        Sidonie Dickens, MD  Surgical Oncology, PGY-1  Pager: [phone]

## 2023-04-25 MED ADMIN — tamsulosin (FLOMAX) 24 hr capsule 0.4 mg: .4 mg | ORAL | @ 13:00:00

## 2023-04-25 MED ADMIN — fat emulsion 20 % with fish oil (SMOFlipid) infusion 250 mL: 250 mL | INTRAVENOUS | @ 02:00:00 | Stop: 2023-04-25

## 2023-04-25 MED ADMIN — HYDROmorphone (PF) (DILAUDID) injection 1 mg: 1 mg | INTRAVENOUS | @ 21:00:00 | Stop: 2023-05-05

## 2023-04-25 MED ADMIN — metoclopramide (REGLAN) injection 10 mg: 10 mg | INTRAVENOUS | @ 17:00:00

## 2023-04-25 MED ADMIN — amlodipine (NORVASC) tablet 5 mg: 5 mg | ORAL | @ 13:00:00

## 2023-04-25 MED ADMIN — HYDROmorphone (PF) (DILAUDID) injection 1 mg: 1 mg | INTRAVENOUS | @ 13:00:00 | Stop: 2023-05-05

## 2023-04-25 MED ADMIN — HYDROmorphone (PF) (DILAUDID) injection 1 mg: 1 mg | INTRAVENOUS | Stop: 2023-05-05

## 2023-04-25 MED ADMIN — HYDROmorphone (DILAUDID) tablet 4 mg: 4 mg | ORAL | @ 14:00:00 | Stop: 2023-05-08

## 2023-04-25 MED ADMIN — acetaminophen (TYLENOL) tablet 1,000 mg: 1000 mg | ORAL | @ 02:00:00

## 2023-04-25 MED ADMIN — acetaminophen (TYLENOL) tablet 1,000 mg: 1000 mg | ORAL | @ 10:00:00

## 2023-04-25 MED ADMIN — sodium chloride (NS) 0.9 % flush 10 mL: 10 mL | INTRAVENOUS | @ 13:00:00

## 2023-04-25 MED ADMIN — methocarbamol (ROBAXIN) 750 mg in sodium chloride (NS) 0.9 % 50 mL IVPB: 750 mg | INTRAVENOUS | @ 02:00:00

## 2023-04-25 MED ADMIN — metoPROLOL tartrate (Lopressor) tablet 100 mg: 100 mg | ORAL | @ 13:00:00

## 2023-04-25 MED ADMIN — methocarbamol (ROBAXIN) 750 mg in sodium chloride (NS) 0.9 % 50 mL IVPB: 750 mg | INTRAVENOUS | @ 10:00:00

## 2023-04-25 MED ADMIN — sodium chloride (NS) 0.9 % flush 10 mL: 10 mL | INTRAVENOUS | @ 02:00:00

## 2023-04-25 MED ADMIN — levothyroxine (SYNTHROID) tablet 150 mcg: 150 ug | ORAL | @ 13:00:00

## 2023-04-25 MED ADMIN — sodium chloride (NS) 0.9 % flush 10 mL: 10 mL | INTRAVENOUS | @ 20:00:00

## 2023-04-25 MED ADMIN — enoxaparin (LOVENOX) syringe 40 mg: 40 mg | SUBCUTANEOUS | @ 04:00:00

## 2023-04-25 MED ADMIN — HYDROmorphone (PF) (DILAUDID) injection 1 mg: 1 mg | INTRAVENOUS | @ 09:00:00 | Stop: 2023-05-05

## 2023-04-25 MED ADMIN — HYDROmorphone (DILAUDID) tablet 4 mg: 4 mg | ORAL | @ 04:00:00 | Stop: 2023-05-08

## 2023-04-25 MED ADMIN — pantoprazole (Protonix) injection 40 mg: 40 mg | INTRAVENOUS | @ 13:00:00

## 2023-04-25 MED ADMIN — methocarbamol (ROBAXIN) 750 mg in sodium chloride (NS) 0.9 % 50 mL IVPB: 750 mg | INTRAVENOUS | @ 17:00:00

## 2023-04-25 MED ADMIN — HYDROmorphone (PF) (DILAUDID) injection 1 mg: 1 mg | INTRAVENOUS | @ 17:00:00 | Stop: 2023-05-05

## 2023-04-25 MED ADMIN — acetaminophen (TYLENOL) tablet 1,000 mg: 1000 mg | ORAL | @ 17:00:00

## 2023-04-25 MED ADMIN — Parenteral Nutrition (CENTRAL): INTRAVENOUS | @ 02:00:00 | Stop: 2023-04-25

## 2023-04-25 MED ADMIN — lidocaine (ASPERCREME) 4 % 1 patch: 1 | TRANSDERMAL | @ 13:00:00

## 2023-04-25 MED ADMIN — metoPROLOL tartrate (Lopressor) tablet 100 mg: 100 mg | ORAL

## 2023-04-25 MED ADMIN — ondansetron (ZOFRAN) injection 4 mg: 4 mg | INTRAVENOUS | @ 13:00:00

## 2023-04-25 MED ADMIN — amitriptyline (ELAVIL) tablet 100 mg: 100 mg | ORAL

## 2023-04-25 NOTE — Unmapped (Signed)
Surgery Progress Note  9 Days [facility] Service: SRA - GI 1 [address]) [phone]  Admitting Attending: Surgical Oncology Attendings: Daryl Eastern, DO      Assessment:     Sergio Perez is a 62 y.o. 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     Now s/p ex lap, small bowel resection, new ileocolic side-to-side anastomosis and omentectomy on 04/16/23. 34fr malecot G tube in place.     Today, advanced diet and added reglan.     Interval Events:     NAEON. VSS. Mildly bloated, non-bilious GT output. Vents GT on his own, when symptomatic. +Flatus, BM.     Pain is much better controlled this morning.     Plan:       Neuro/Pain:   *Hx of CVA   - Holding home Plavix   *Pain/Nausea   - Scheduled IV Tylenol, robaxin, lido patches    - PRN PO dilaudid for moderate pain, IV HM for severe pain    - can start weaning IV meds tomorrow    - Home amitriptyline sch    - Atarax PRN   - Reglan sch    - Zofran and compazine prn   - Chronic pain re-engaged- appreciate recs - can add short-term course of ibuprofen if needed    CV: VSS and normotensive  *Hx of HTN, HLD  - Home metoprolol 100mg  BID and amlodipine   - Holding home fenofibrate, atorvastatin       Pulm: NWOB and on RA      GI  - F: TPN  - E: replete PRN  - N: FLD  - Protonix, PRN tums     *malignant SBO s/p ex-lap, ileocolic anastomotic resection (9/13)  - Bilious GT output 9/22, non-bilious 9/23  - Vent G-tube prn for pain, nausea - leaves GT clamped unless symptomatic   - +Flatus, BM      Endo: *Hx of hypothyroidism  - Home levothyroxine      Renal/GU:   - Passed TOV  - Flomax 0.4mg  daily       Heme/ID:   - CBC every other day  - Lovenox DVT ppx      Dispo: Floor    Please page SRA - GI 1 [address]owda, [place]) [phone] with any questions.     Attending Attestation:    I supervised the resident for the history and exam, and conducted pertinent aspects of the history and exam after the initial review. I discussed the findings, assessment and plan with the resident and agree with the findings and plan as documented in the resident???s note on the date listed above.          Objective:      Vital Signs:  BP 146/82  - Pulse 94  - Temp 36.6 ??C (97.9 ??F) (Oral)  - Resp 16  - Ht 182.9 cm (6')  - Wt 91.9 kg (202 lb 9.6 oz)  - SpO2 98%  - BMI 27.48 kg/m??     Input/Output:  I/O         09/20 0701  09/21 0700 09/21 0701  09/22 0700 09/22 0701  09/23 0700    P.O. 900 940     IV Piggyback 124 219.5     TPN 1799.9 3385     Total Intake 2823.9 4544.5     Urine (mL/kg/hr) 675 (0.3) 1150 (0.5) 750 (0.9)    Emesis/NG output 1750  200    Stool 0 0     Total Output(mL/kg) 2425 (26.4) 1150 (12.5) 950 (10.3)    Net +398.9 +3394.5 -950           Urine Occurrence 2 x 0 x     Stool Occurrence 1 x 1 x     Emesis Occurrence 0 x              Physical Exam:    General: Cooperative, no distress, well appearing male  Neuro: Alert, oriented, appropriate  HEENT: Normocephalic, atraumatic  Pulmonary: Normal work of breathing, equal bilateral chest rise on RA  Cardiovascular: HDS, appears well perfused   Abdomen: distended, mildly tender on the right, G-tube with non-bilious output; moderate erythema around midline incision above umbilicus   Extremities: no peripheral edema    Labs:    Lab Results   Component Value Date    WBC 11.1 04/22/2023    HGB 10.6 (L) 04/22/2023    HCT 31.3 (L) 04/22/2023    PLT 353 04/22/2023       Lab Results   Component Value Date    NA 134 (L) 04/24/2023    K 4.4 04/24/2023    CL 104 04/24/2023    CO2 26.0 04/24/2023    BUN 28 (H) 04/24/2023    CREATININE 0.61 (L) 04/24/2023    CALCIUM 8.9 04/24/2023    MG 2.1 04/24/2023    PHOS 4.9 04/24/2023       Microbiology Results (last day)       ** No results found for the last 24 hours. **            Imaging:   CT A/P from OSH 9/10:   -Multiple dilated loops of small bowel, with somewhat gradual transition to decompressed small bowel in the right lower quadrant. Findings raise a concern for developing partial small bowel obstruction.  -Peritoneum and omental carcinomatosis with increasing loculated appearing inferior perihepatic and perisplenic ascites.  -2.5 cm enhancing lesion in segment 6 of the liver, stable in size.        Sidonie Dickens, MD  Surgical Oncology, PGY-1  Pager: [phone]

## 2023-04-25 NOTE — Unmapped (Signed)
=  Department of Anesthesiology  Pain Medicine Division    Chronic Pain in-patient Consult follow-up Note      Requesting Attending Physician:  Lynnell Dike, DO  Service Requesting Consult:  Sur Oncology [facility])    Assessment/Recommendations:  The patient was seen in consultation on request of Lynnell Dike, DO regarding assistance with pain management. The patient is obtaining adequate pain relief on current medication regimen.     The patient is a 62 y.o. 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      Now s/p ex lap, small bowel resection and omentectomy on 04/16/23.     Interval:   Patient states combination of Oral HM and IV HM PRN is working better. No significant side effects. Would like to stay on same dose range for another 24 hours before de-escalation of IV dilaudid tomorrow. We think this is reasonable plan before anticipated discharge.    Home regimen:    Oxycodone 5 mg every 6 hours as needed    Recommendations:  -The chronic pain service is a consult service and does not place orders, just makes recommendations (except ketamine and lidocaine infusions)  -Please evaluate all patients on opioids for appropriateness of prescribing narcan at discharge.  The chronic pain service can assist with this.  Nasal narcan covered by most insurance.  -Recommendations given apply to the current hospitalization and do not reflect long term recommendations.    -We recommend to  -Continue Dilaudid 0.5-1 mg every 4 hours as needed severe pain today and start weaning potentially from 04/26/23 as per discussion.  -Discontinue oxycodone 5-10 mg  -continue oral hydromorphone 4 mg p.o. every 4 hours as needed moderate pain  -Continue Tylenol 1 g 3 times daily  -Continue Robaxin-750 milligram every 8 hours  -continue amitriptiline 100 mg at bedtime.  -Completed IV Toradol previously but can add oral NSAIDs short term if okay by primary team  -Consider consulting palliative team when patient ready.  Patient not agreeable to consult with palliative team today.     We will continue to see the patient.  Plan was discussed with the primary team agreeable with the plan    Prescribe naloxone at discharge.  Nasal narcan for most insured (Nasal narcan 4mg /actuation, prescribe 1 kit, instructions at [email or web address]).  For uninsured, chronic pain can work to assist in finding an option.  OTC nasal narcan now available at most pharmacies for around $45.    History of Present Illness:    Reason for Consult: Acute postoperative pain, chronic pain syndrome, history of colon cancer    When we went to see him today patient says that his pain is doing better with his oral pain medication.  Patient thought that he has been using Dilaudid PCA however he has not had any dose delivered to him since 11 AM earlier today.  He saw him at around 4 PM.  We discussed with the patient about discontinuing his PCA pump.  Patient is agreeable with the same.  Has no other new pain.    Pt had 1st BM today. No abd distension. No vomiting. No fever.  Prior to admission, the patient was on home opiate medications.  Hehas been followed by a pain clinic.  The [place] CSRS was reviewed.    Allergies    No Known Allergies      Home Medications    Medications Prior to Admission   Medication Sig Dispense Refill Last Dose    acetaminophen (TYLENOL) 500 MG tablet Take 2 tablets (1,000 mg total) by mouth every twelve (12) hours as needed for pain.       amitriptyline (ELAVIL) 100 MG tablet Take 1 tablet (100 mg total) by mouth nightly. Frequency:QHSPRN   Dosage:50   MG  Instructions:  Note:Dose: 50MG        amLODIPine (NORVASC) 10 MG tablet        atorvastatin (LIPITOR) 40 MG tablet Take 1 tablet (40 mg total) by mouth daily.       clopidogreL (PLAVIX) 75 mg tablet clopidogrel 75 mg tablet   TAKE 1 TABLET BY MOUTH ONCE DAILY       diphenoxylate-atropine (LOMOTIL) 2.5-0.025 mg per tablet Take 1 tablet by mouth 4 times daily 30 tablet 0     doxycycline (MONODOX) 100 MG capsule Take 1 capsule (100 mg total) by mouth two (2) times a day. 60 capsule 5     fenofibrate (LOFIBRA) 160 MG tablet Take 1 tablet (160 mg total) by mouth daily.       fluoride, sodium, (DENTAGEL) 1.1 % Gel Apply 1 application topically two (2) times a day. 56 g 7     gabapentin (NEURONTIN) 400 MG capsule 1 in the AM; 1 at lunch and 3 at bedtime       levothyroxine (SYNTHROID, LEVOTHROID) 150 MCG tablet Take 1 tablet (150 mcg total) by mouth daily. 30 tablet 3     loperamide (IMODIUM A-D) 2 mg tablet Take 2 tablets (4 mg) by mouth for first loose stool followed by 1 tablet (2 mg) after each loose stool thereafter (maximum of 8 tablets per day). 30 tablet 11     magnesium oxide (MAG-OX) 400 mg (241.3 mg elemental magnesium) tablet Take 1 tablet (400 mg total) by mouth two (2) times a day. 60 tablet 11     metoprolol tartrate (LOPRESSOR) 100 MG tablet 1 tablet (100 mg total) two (2) times a day.       MITIGARE 0.6 mg cap capsule        ondansetron (ZOFRAN) 8 MG tablet Take 1 tablet (8 mg total) by mouth every eight (8) hours as needed for nausea. Do not take on day 1 of chemotherapy cycles. 60 tablet 3     oxyCODONE (ROXICODONE) 5 MG immediate release tablet Take 1 tablet (5 mg total) by mouth every six (6) hours as needed for pain. 90 tablet 0     pantoprazole (PROTONIX) 40 MG tablet Take 1 tablet (40 mg total) by mouth daily.       prochlorperazine (COMPAZINE) 10 MG tablet Take 1 tablet (10 mg total) by mouth every six (6) hours as needed (Nausea & Vomiting). 60 tablet 3     trifluridine-tipiracil (LONSURF) 15-6.14 mg tablet Take 5 tablets (75 mg total) by mouth in the morning and 5 tablets (75 mg total) in the evening. Take with meals. Take for 5 days on, 9 days off for every 14 day cycle. 100 tablet 6        Inpatient Medications  Current Facility-Administered Medications   Medication Dose Route Frequency Provider Last Rate Last Admin    acetaminophen (TYLENOL)  tablet 1,000 mg  1,000 mg Oral Q8H Tiburcio Pea Lurline Del, FNP   1,000 mg at 04/25/23 0535    amitriptyline (ELAVIL) tablet 100 mg  100 mg Oral Nightly Kirke Shaggy, FNP   100 mg at 04/24/23 2022    amlodipine (NORVASC) tablet 5 mg  5 mg Oral Daily Kirke Shaggy, FNP   5 mg at 04/25/23 1610    calcium carbonate (TUMS) chewable tablet 200 mg elem calcium  200 mg elem calcium Oral TID PRN Karna Christmas, MD   200 mg elem calcium at 04/20/23 1638    enoxaparin (LOVENOX) syringe 40 mg  40 mg Subcutaneous Q24H Emilio Aspen, MD   40 mg at 04/25/23 0021    Parenteral Nutrition (CENTRAL)   Intravenous Continuous Sidonie Dickens R, MD 150 mL/hr at 04/24/23 2200 New Bag at 04/24/23 2200    And    fat emulsion 20 % with fish oil (SMOFlipid) infusion 250 mL  250 mL Intravenous Continuous Kadakia, Esha R, MD 20.8 mL/hr at 04/24/23 2200 250 mL at 04/24/23 2200    HYDROmorphone (DILAUDID) tablet 4 mg  4 mg Oral Q4H PRN Sidonie Dickens R, MD   4 mg at 04/25/23 0021    HYDROmorphone (PF) injection Syrg 0.5 mg  0.5 mg Intravenous Q4H PRN Karna Christmas, MD   0.5 mg at 04/21/23 1501    Or    HYDROmorphone (PF) (DILAUDID) injection 1 mg  1 mg Intravenous Q4H PRN Karna Christmas, MD   1 mg at 04/25/23 0849    hydrOXYzine (ATARAX) tablet 25 mg  25 mg Oral Q4H PRN Karna Christmas, MD   25 mg at 04/18/23 0819    levothyroxine (SYNTHROID) tablet 150 mcg  150 mcg Oral Daily Pricilla Larsson E, MD   150 mcg at 04/25/23 0853    lidocaine (ASPERCREME) 4 % 1 patch  1 patch Transdermal Daily Sidonie Dickens R, MD   1 patch at 04/25/23 0854    methocarbamol (ROBAXIN) 750 mg in sodium chloride (NS) 0.9 % 50 mL IVPB  750 mg Intravenous [facility] Montel Culver, MD   Stopped at 04/25/23 9604    metoclopramide (REGLAN) injection 5 mg  5 mg Intravenous Q6H PRN Kirke Shaggy, FNP        metoPROLOL tartrate (Lopressor) tablet 100 mg  100 mg Oral BID Kirke Shaggy, FNP   100 mg at 04/25/23 0853    ondansetron (ZOFRAN) injection 4 mg  4 mg Intravenous Q6H PRN Emilio Aspen, MD   4 mg at 04/25/23 0902    pantoprazole (Protonix) injection 40 mg  40 mg Intravenous Daily Emilio Aspen, MD   40 mg at 04/25/23 0853    prochlorperazine (COMPAZINE) injection 5 mg  5 mg Intravenous Q6H PRN Emilio Aspen, MD   5 mg at 04/17/23 0800    sodium chloride (NS) 0.9 % flush 10 mL  10 mL Intravenous Q8H Emilio Aspen, MD   10 mL at 04/25/23 0854    sodium chloride (NS) 0.9 % flush 10 mL  10 mL Intravenous Q8H Emilio Aspen, MD   10 mL at 04/25/23 0854    tamsulosin (FLOMAX) 24 hr capsule 0.4 mg  0.4 mg Oral Daily Emilio Aspen, MD   0.4 mg at 04/25/23 5409         Past Medical History    Past Medical History:   Diagnosis Date    Hypothyroidism     Squamous cell  carcinoma of base of tongue (CMS-HCC) 11/2008           Past Surgical History    Past Surgical History:   Procedure Laterality Date    chemoradiotherapy  2010    IR INSERT PORT AGE GREATER THAN 5 YRS  08/19/2022    IR INSERT PORT AGE GREATER THAN 5 YRS 08/19/2022 Evron, Gerri Lins, PA IMG VIR [facility]    laser resection      NECK DISSECTION      PR EXPLORATORY OF ABDOMEN N/A 04/16/2023    Procedure: EXPLORATORY LAPAROTOMY, EXPLORATORY CELIOTOMY WITH OR WITHOUT BIOPSY(S);  Surgeon: Lynnell Dike, DO;  Location: OR [facility];  Service: Surgical Oncology    PR REMOVAL OF OMENTUM N/A 04/16/2023    Procedure: OMENTECTOMY, EPIPLOECTOMY, RESECTION OF OMENTUM;  Surgeon: Lynnell Dike, DO;  Location: OR [facility];  Service: Surgical Oncology    PR RESECT SMALL INTEST,SINGL RESEC/ANAS N/A 04/16/2023    Procedure: ENTERECTOMY SM INTES; SNGL RESECT & ANASTOM;  Surgeon: Lynnell Dike, DO;  Location: OR [facility];  Service: Surgical Oncology           Family History    Family History   Problem Relation Age of Onset    Cancer Brother         Brain         Social History:  Social History     Tobacco Use   Smoking Status Former    Current packs/day: 0.00    Average packs/day: 1 pack/day for 7.0 years (7.0 ttl pk-yrs)    Types: Cigarettes    Start date: 05/16/2003    Quit date: 05/15/2010    Years since quitting: 12.9   Smokeless Tobacco Never   Tobacco Comments    Quit smoking 16 years ago     Social History     Substance and Sexual Activity   Alcohol Use Yes    Alcohol/week: 35.0 standard drinks of alcohol    Types: 35 Cans of beer per week     Social History     Substance and Sexual Activity   Drug Use Yes    Frequency: 7.0 times per week    Types: Marijuana    Comment: rolled cigarette of MJ         Lab Results   Component Value Date    CREATININE 0.61 (L) 04/24/2023       Lab Results   Component Value Date    ALKPHOS 113 04/23/2023    BILITOT 0.2 (L) 04/23/2023    BILIDIR <0.10 04/23/2023    PROT 6.7 04/23/2023    ALBUMIN 2.5 (L) 04/23/2023    ALT 12 04/23/2023    AST 14 04/23/2023       Encounter Date: 04/14/23   ECG 12 Lead   Result Value    EKG Systolic BP     EKG Diastolic BP     EKG Ventricular Rate 130    EKG Atrial Rate 130    EKG P-R Interval 134    EKG QRS Duration 84    EKG Q-T Interval 314    EKG QTC Calculation 462    EKG Calculated P Axis 14    EKG Calculated R Axis 12    EKG Calculated T Axis 31    QTC Fredericia 406    Narrative    SINUS TACHYCARDIA  OTHERWISE NORMAL ECG  NO PREVIOUS ECGS AVAILABLE  Confirmed by Mariane Baumgarten ([id number]) on 04/16/2023 9:48:15 PM  No results found for requested labs within last 30 days.       Objective:     Vital Signs  Temp:  [36.4 ??C (97.6 ??F)-36.9 ??C (98.4 ??F)] 36.8 ??C (98.2 ??F)  Heart Rate:  [87-103] 96  Resp:  [16-18] 17  BP: (119-158)/(69-101) 151/88  MAP (mmHg):  [83-113] 106  SpO2:  [96 %-100 %] 96 %    Physical Exam    GENERAL:  Well developed, well-nourished male and is in no apparent distress.   HEAD/NECK:    Reveals normocephalic/atraumatic.   CARDIOVASCULAR:   Regular rate  LUNGS:   Normal work of breathing, no supplemental 02  EXTREMITIES:  Warm, no clubbing, cyanosis, or edema was noted.  NEUROLOGIC:    The patient was alert and oriented times four with normal language, attention, cognition and memory. Cranial nerve exam was grossly normal.   MUSCULOSKELETAL:    Patient ambulatory.  SKIN:  No obvious rashes lesions or erythema  PSY:  Appropriate affect and mood.      Problem List    Malignant bowel obstruction  Chronic pain syndrome

## 2023-04-26 LAB — MAGNESIUM
MAGNESIUM: 1.8 mg/dL (ref 1.6–2.6)
MAGNESIUM: 1.8 mg/dL (ref 1.6–2.6)

## 2023-04-26 LAB — BASIC METABOLIC PANEL
ANION GAP: 6 mmol/L (ref 5–14)
BLOOD UREA NITROGEN: 19 mg/dL (ref 9–23)
BUN / CREAT RATIO: 33
CALCIUM: 8.8 mg/dL (ref 8.7–10.4)
CHLORIDE: 104 mmol/L (ref 98–107)
CO2: 26 mmol/L (ref 20.0–31.0)
CREATININE: 0.58 mg/dL — ABNORMAL LOW
EGFR CKD-EPI (2021) MALE: >90 mL/min/{1.73_m2} (ref >=60)
GLUCOSE RANDOM: 136 mg/dL (ref 70–179)
POTASSIUM: 3.8 mmol/L (ref 3.4–4.8)
SODIUM: 136 mmol/L (ref 135–145)

## 2023-04-26 LAB — PHOSPHORUS
PHOSPHORUS: 4.3 mg/dL (ref 2.4–5.1)
PHOSPHORUS: 4.4 mg/dL (ref 2.4–5.1)

## 2023-04-26 MED ADMIN — metoclopramide (REGLAN) injection 10 mg: 10 mg | INTRAVENOUS | @ 01:00:00

## 2023-04-26 MED ADMIN — levothyroxine (SYNTHROID) tablet 150 mcg: 150 ug | ORAL | @ 13:00:00

## 2023-04-26 MED ADMIN — methocarbamol (ROBAXIN) 750 mg in sodium chloride (NS) 0.9 % 50 mL IVPB: 750 mg | INTRAVENOUS | @ 02:00:00

## 2023-04-26 MED ADMIN — methocarbamol (ROBAXIN) 750 mg in sodium chloride (NS) 0.9 % 50 mL IVPB: 750 mg | INTRAVENOUS | @ 18:00:00

## 2023-04-26 MED ADMIN — metoclopramide (REGLAN) injection 10 mg: 10 mg | INTRAVENOUS | @ 18:00:00

## 2023-04-26 MED ADMIN — amitriptyline (ELAVIL) tablet 100 mg: 100 mg | ORAL | @ 01:00:00

## 2023-04-26 MED ADMIN — HYDROmorphone (DILAUDID) tablet 4 mg: 4 mg | ORAL | @ 20:00:00 | Stop: 2023-05-10

## 2023-04-26 MED ADMIN — acetaminophen (TYLENOL) tablet 1,000 mg: 1000 mg | ORAL | @ 18:00:00

## 2023-04-26 MED ADMIN — acetaminophen (TYLENOL) tablet 1,000 mg: 1000 mg | ORAL | @ 11:00:00

## 2023-04-26 MED ADMIN — lidocaine (ASPERCREME) 4 % 1 patch: 1 | TRANSDERMAL | @ 13:00:00

## 2023-04-26 MED ADMIN — potassium chloride ER tablet 20 mEq: 20 meq | ORAL | @ 14:00:00 | Stop: 2023-04-26

## 2023-04-26 MED ADMIN — pantoprazole (Protonix) injection 40 mg: 40 mg | INTRAVENOUS | @ 13:00:00

## 2023-04-26 MED ADMIN — metoPROLOL tartrate (Lopressor) tablet 100 mg: 100 mg | ORAL | @ 13:00:00

## 2023-04-26 MED ADMIN — HYDROmorphone (PF) injection Syrg 0.5 mg: .5 mg | INTRAVENOUS | @ 22:00:00 | Stop: 2023-05-05

## 2023-04-26 MED ADMIN — metoPROLOL tartrate (Lopressor) tablet 100 mg: 100 mg | ORAL | @ 01:00:00

## 2023-04-26 MED ADMIN — metoclopramide (REGLAN) injection 10 mg: 10 mg | INTRAVENOUS | @ 13:00:00

## 2023-04-26 MED ADMIN — HYDROmorphone (PF) injection Syrg 0.5 mg: .5 mg | INTRAVENOUS | @ 18:00:00 | Stop: 2023-05-05

## 2023-04-26 MED ADMIN — methocarbamol (ROBAXIN) 750 mg in sodium chloride (NS) 0.9 % 50 mL IVPB: 750 mg | INTRAVENOUS | @ 10:00:00

## 2023-04-26 MED ADMIN — cephalexin (KEFLEX) capsule 500 mg: 500 mg | ORAL | @ 14:00:00 | Stop: 2023-05-01

## 2023-04-26 MED ADMIN — sodium chloride (NS) 0.9 % flush 10 mL: 10 mL | INTRAVENOUS | @ 04:00:00

## 2023-04-26 MED ADMIN — sodium chloride (NS) 0.9 % flush 10 mL: 10 mL | INTRAVENOUS | @ 14:00:00

## 2023-04-26 MED ADMIN — Parenteral Nutrition (CENTRAL): INTRAVENOUS | @ 02:00:00 | Stop: 2023-04-26

## 2023-04-26 MED ADMIN — HYDROmorphone (PF) (DILAUDID) injection 1 mg: 1 mg | INTRAVENOUS | @ 02:00:00 | Stop: 2023-05-05

## 2023-04-26 MED ADMIN — magnesium oxide-Mg AA chelate (Magnesium Plus Protein) 2 tablet: 2 | ORAL | @ 15:00:00 | Stop: 2023-04-26

## 2023-04-26 MED ADMIN — HYDROmorphone (DILAUDID) tablet 4 mg: 4 mg | ORAL | @ 01:00:00 | Stop: 2023-05-08

## 2023-04-26 MED ADMIN — HYDROmorphone (DILAUDID) tablet 4 mg: 4 mg | ORAL | @ 14:00:00 | Stop: 2023-05-10

## 2023-04-26 MED ADMIN — amlodipine (NORVASC) tablet 5 mg: 5 mg | ORAL | @ 13:00:00 | Stop: 2023-04-26

## 2023-04-26 MED ADMIN — HYDROmorphone (PF) injection Syrg 0.5 mg: .5 mg | INTRAVENOUS | @ 13:00:00 | Stop: 2023-05-05

## 2023-04-26 MED ADMIN — calcium carbonate (TUMS) chewable tablet 200 mg elem calcium: 200 mg | ORAL | @ 01:00:00

## 2023-04-26 MED ADMIN — sodium chloride (NS) 0.9 % flush 10 mL: 10 mL | INTRAVENOUS | @ 20:00:00

## 2023-04-26 MED ADMIN — acetaminophen (TYLENOL) tablet 1,000 mg: 1000 mg | ORAL | @ 01:00:00

## 2023-04-26 MED ADMIN — tamsulosin (FLOMAX) 24 hr capsule 0.4 mg: .4 mg | ORAL | @ 13:00:00

## 2023-04-26 MED ADMIN — cephalexin (KEFLEX) capsule 500 mg: 500 mg | ORAL | @ 22:00:00 | Stop: 2023-05-01

## 2023-04-26 MED ADMIN — HYDROmorphone (PF) (DILAUDID) injection 1 mg: 1 mg | INTRAVENOUS | @ 07:00:00 | Stop: 2023-04-26

## 2023-04-26 MED ADMIN — calcium carbonate (TUMS) chewable tablet 200 mg elem calcium: 200 mg | ORAL | @ 22:00:00

## 2023-04-26 MED ADMIN — HYDROmorphone (DILAUDID) tablet 4 mg: 4 mg | ORAL | @ 10:00:00 | Stop: 2023-04-26

## 2023-04-26 MED ADMIN — fat emulsion 20 % with fish oil (SMOFlipid) infusion 250 mL: 250 mL | INTRAVENOUS | @ 02:00:00 | Stop: 2023-04-26

## 2023-04-26 MED ADMIN — enoxaparin (LOVENOX) syringe 40 mg: 40 mg | SUBCUTANEOUS | @ 04:00:00

## 2023-04-26 MED ADMIN — polyethylene glycol (MIRALAX) packet 17 g: 17 g | ORAL | @ 13:00:00

## 2023-04-26 NOTE — Unmapped (Addendum)
Pt resting throughout shift. Aox4, c/o pain managed by PRNs. VSS. G tube to straight drain. Abd soft, rounded, tender. Surgical site clean, dry, intact. TPN infusing. Pt ambulating on unit independently. Safety precautions in place, call light within reach.          Problem: Adult Inpatient Plan of Care  Goal: Plan of Care Review  Outcome: Ongoing - Unchanged  Goal: Patient-Specific Goal (Individualized)  Outcome: Ongoing - Unchanged  Goal: Absence of Hospital-Acquired Illness or Injury  Outcome: Ongoing - Unchanged  Intervention: Identify and Manage Fall Risk  Recent Flowsheet Documentation  Taken 04/25/2023 2300 by Philomena Course, RN  Safety Interventions:   fall reduction program maintained   lighting adjusted for tasks/safety   low bed  Taken 04/25/2023 2100 by Philomena Course, RN  Safety Interventions:   fall reduction program maintained   lighting adjusted for tasks/safety   low bed  Taken 04/25/2023 1900 by Philomena Course, RN  Safety Interventions:   fall reduction program maintained   lighting adjusted for tasks/safety   low bed  Intervention: Prevent Skin Injury  Recent Flowsheet Documentation  Taken 04/25/2023 2300 by Philomena Course, RN  Positioning for Skin: Supine/Back  Taken 04/25/2023 2100 by Philomena Course, RN  Positioning for Skin: Supine/Back  Taken 04/25/2023 1900 by Philomena Course, RN  Positioning for Skin: Supine/Back  Goal: Optimal Comfort and Wellbeing  Outcome: Ongoing - Unchanged  Goal: Readiness for Transition of Care  Outcome: Ongoing - Unchanged  Goal: Rounds/Family Conference  Outcome: Ongoing - Unchanged     Problem: Malnutrition  Goal: Improved Nutritional Intake  Outcome: Ongoing - Unchanged     Problem: Wound  Goal: Optimal Coping  Outcome: Ongoing - Unchanged  Goal: Optimal Functional Ability  Outcome: Ongoing - Unchanged  Intervention: Optimize Functional Ability  Recent Flowsheet Documentation  Taken 04/25/2023 1900 by Philomena Course, RN  Activity Management: ambulated outside room  Goal: Absence of Infection Signs and Symptoms  Outcome: Ongoing - Unchanged  Goal: Improved Oral Intake  Outcome: Ongoing - Unchanged  Goal: Optimal Pain Control and Function  Outcome: Ongoing - Unchanged  Goal: Skin Health and Integrity  Outcome: Ongoing - Unchanged  Intervention: Optimize Skin Protection  Recent Flowsheet Documentation  Taken 04/25/2023 2300 by Philomena Course, RN  Pressure Reduction Techniques: frequent weight shift encouraged  Head of Bed [facility]) Positioning: HOB elevated  Taken 04/25/2023 2100 by Philomena Course, RN  Pressure Reduction Techniques: frequent weight shift encouraged  Head of Bed [facility]) Positioning: HOB elevated  Taken 04/25/2023 1900 by Philomena Course, RN  Activity Management: ambulated outside room  Pressure Reduction Techniques: frequent weight shift encouraged  Head of Bed (HOB) Positioning: HOB elevated  Goal: Optimal Wound Healing  Outcome: Ongoing - Unchanged     Problem: Fall Injury Risk  Goal: Absence of Fall and Fall-Related Injury  Outcome: Ongoing - Unchanged  Intervention: Promote Injury-Free Environment  Recent Flowsheet Documentation  Taken 04/25/2023 2300 by Philomena Course, RN  Safety Interventions:   fall reduction program maintained   lighting adjusted for tasks/safety   low bed  Taken 04/25/2023 2100 by Philomena Course, RN  Safety Interventions:   fall reduction program maintained   lighting adjusted for tasks/safety   low bed  Taken 04/25/2023 1900 by Philomena Course, RN  Safety Interventions:   fall reduction program maintained   lighting adjusted for tasks/safety   low bed     Problem: Nausea and Vomiting  Goal: Nausea and Vomiting Relief  Outcome: Ongoing - Unchanged  Problem: Nausea and Vomiting  Goal: Nausea and Vomiting Relief  Outcome: Ongoing - Unchanged     Problem: Pain Acute  Goal: Optimal Pain Control and Function  Outcome: Ongoing - Unchanged     Problem: Comorbidity Management  Goal: Blood Pressure in Desired Range  Outcome: Ongoing - Unchanged Problem: Surgery Nonspecified  Goal: Absence of Bleeding  Outcome: Ongoing - Unchanged  Goal: Effective Bowel Elimination  Outcome: Ongoing - Unchanged  Goal: Fluid and Electrolyte Balance  Outcome: Ongoing - Unchanged  Goal: Blood Glucose Level Within Targeted Range  Outcome: Ongoing - Unchanged  Goal: Absence of Infection Signs and Symptoms  Outcome: Ongoing - Unchanged  Goal: Anesthesia/Sedation Recovery  Outcome: Ongoing - Unchanged  Intervention: Optimize Anesthesia Recovery  Recent Flowsheet Documentation  Taken 04/25/2023 2300 by Philomena Course, RN  Safety Interventions:   fall reduction program maintained   lighting adjusted for tasks/safety   low bed  Taken 04/25/2023 2100 by Philomena Course, RN  Safety Interventions:   fall reduction program maintained   lighting adjusted for tasks/safety   low bed  Taken 04/25/2023 1900 by Philomena Course, RN  Safety Interventions:   fall reduction program maintained   lighting adjusted for tasks/safety   low bed  Goal: Optimal Pain Control and Function  Outcome: Ongoing - Unchanged  Goal: Nausea and Vomiting Relief  Outcome: Ongoing - Unchanged  Goal: Effective Urinary Elimination  Outcome: Ongoing - Unchanged  Goal: Effective Oxygenation and Ventilation  Outcome: Ongoing - Unchanged  Intervention: Optimize Oxygenation and Ventilation  Recent Flowsheet Documentation  Taken 04/25/2023 2300 by Philomena Course, RN  Head of Bed [facility]) Positioning: HOB elevated  Taken 04/25/2023 2100 by Philomena Course, RN  Head of Bed [facility]) Positioning: HOB elevated  Taken 04/25/2023 1900 by Philomena Course, RN  Head of Bed [facility]) Positioning: HOB elevated     Problem: Self-Care Deficit  Goal: Improved Ability to Complete Activities of Daily Living  Outcome: Ongoing - Unchanged

## 2023-04-26 NOTE — Unmapped (Signed)
Surgery Progress Note  10 Days [facility] Service: [facility] - GI [address]) [phone]  Admitting Attending: Surgical Oncology Attendings: Daryl Eastern, DO      Assessment:     Sergio Perez is a 62 y.o. 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     Now s/p ex lap, small bowel resection, new ileocolic side-to-side anastomosis and omentectomy on 04/16/23. 20fr malecot G tube in place. Advanced diet today, will continue with weaning IV pain meds and appropriate pain management.     Interval Events:     NAEON. Mildly tachycardic 109, otherwise VSS. Pain better controlled, but worse overnight in RLQ. Intermittent nausea, relieved with venting G-tube. G-tube open 1.5-2.5 hours and clamped for 1 hour at a time. No vomiting. Tolerating full liquid diet.     Plan:       Neuro/Pain:   *Hx of CVA   - Holding home Plavix   *Pain/Nausea   - Scheduled IV Tylenol, robaxin, lido patches    - PRN PO dilaudid 2-4mg  for moderate pain, IV HM for severe pain    - Weaned 0.5mg  IV HM for severe pain    - Home amitriptyline sch    - Atarax PRN   - Reglan sch    - Zofran and compazine prn   - Chronic pain re-engaged- appreciate recs - can add short-term course of ibuprofen if needed    CV: VSS and normotensive  *Hx of HTN, HLD  - Home metoprolol 100mg  BID and amlodipine   - Holding home fenofibrate, atorvastatin       Pulm: NWOB and on RA      GI  - F: renew TPN  - E: replete PRN  - N: Surgical GI Diet, Ensures   - Protonix, PRN tums  - Calorie Count    *malignant SBO s/p ex-lap, ileocolic anastomotic resection (9/13)  - Bilious GT output 9/22, non-bilious 9/23  - Vent G-tube prn for pain, nausea - leave GT clamped unless symptomatic   - +Flatus, BM  - Cellulitis on midline incision, Keflex 500mg  QID for 5 days started today       Endo:   *Hx of hypothyroidism  - Home levothyroxine      Renal/GU:   - Passed TOV  - Flomax 0.4mg  daily       Heme/ID:   - CBC every other day  - Lovenox DVT ppx      Dispo: Floor    Please page [facility] - GI 1 [address], [place]) [phone] with any questions.     Attending Attestation:    I supervised the resident for the history and exam, and conducted pertinent aspects of the history and exam after the initial review. I discussed the findings, assessment and plan with the resident and agree with the findings and plan as documented in the resident???s note on the date listed above.          Objective:      Vital Signs:  BP 149/88  - Pulse 100  - Temp 36.8 ??C (98.2 ??F) (Oral)  - Resp 18  - Ht 182.9  cm (6')  - Wt 91.9 kg (202 lb 9.6 oz)  - SpO2 97%  - BMI 27.48 kg/m??     Input/Output:  I/O         09/21 0701  09/22 0700 09/22 0701  09/23 0700 09/23 0701  09/24 0700    P.O. 940 236 0    IV Piggyback 219.5 129     TPN 3385 2072.4     Total Intake 4544.5 2437.4 0    Urine (mL/kg/hr) 1150 (0.5) 750 (0.3)     Emesis/NG output  1000     Stool 0 0     Total Output(mL/kg) 1150 (12.5) 1750 (19)     Net +3394.5 +687.4 0           Urine Occurrence 0 x 5 x 0 x    Stool Occurrence 1 x  0 x            Physical Exam:    General: Cooperative, no distress, well appearing male  Neuro: Alert, oriented, appropriate  HEENT: Normocephalic, atraumatic  Pulmonary: Normal work of breathing, equal bilateral chest rise on RA  Cardiovascular: HDS, appears well perfused   Abdomen: distended, mildly tender on RLQ, G-tube with non-bilious output; erythema around midline incision above umbilicus   Extremities: no peripheral edema    Labs:    Lab Results   Component Value Date WBC 11.1 04/22/2023    HGB 10.6 (L) 04/22/2023    HCT 31.3 (L) 04/22/2023    PLT 353 04/22/2023       Lab Results   Component Value Date    NA 136 04/26/2023    K 3.8 04/26/2023    CL 104 04/26/2023    CO2 26.0 04/26/2023    BUN 19 04/26/2023    CREATININE 0.58 (L) 04/26/2023    CALCIUM 8.8 04/26/2023    MG 1.8 04/26/2023    MG 1.8 04/26/2023    PHOS 4.4 04/26/2023    PHOS 4.3 04/26/2023       Microbiology Results (last day)       ** No results found for the last 24 hours. **            Imaging:   None new in 24 hours.         Karna Christmas, MD   PGY-1 General Surgery

## 2023-04-27 MED ADMIN — levothyroxine (SYNTHROID) tablet 150 mcg: 150 ug | ORAL | @ 14:00:00

## 2023-04-27 MED ADMIN — methocarbamol (ROBAXIN) 750 mg in sodium chloride (NS) 0.9 % 50 mL IVPB: 750 mg | INTRAVENOUS | @ 02:00:00

## 2023-04-27 MED ADMIN — methocarbamol (ROBAXIN) tablet 1,000 mg: 1000 mg | ORAL | @ 21:00:00

## 2023-04-27 MED ADMIN — sodium chloride (NS) 0.9 % flush 10 mL: 10 mL | INTRAVENOUS | @ 04:00:00

## 2023-04-27 MED ADMIN — sodium chloride (NS) 0.9 % flush 10 mL: 10 mL | INTRAVENOUS | @ 11:00:00

## 2023-04-27 MED ADMIN — metoPROLOL tartrate (Lopressor) tablet 100 mg: 100 mg | ORAL | @ 14:00:00

## 2023-04-27 MED ADMIN — ondansetron (ZOFRAN) injection 4 mg: 4 mg | INTRAVENOUS | @ 03:00:00

## 2023-04-27 MED ADMIN — acetaminophen (TYLENOL) tablet 1,000 mg: 1000 mg | ORAL | @ 10:00:00

## 2023-04-27 MED ADMIN — enoxaparin (LOVENOX) syringe 40 mg: 40 mg | SUBCUTANEOUS | @ 04:00:00

## 2023-04-27 MED ADMIN — cephalexin (KEFLEX) capsule 500 mg: 500 mg | ORAL | @ 04:00:00 | Stop: 2023-05-01

## 2023-04-27 MED ADMIN — acetaminophen (TYLENOL) tablet 1,000 mg: 1000 mg | ORAL | @ 03:00:00

## 2023-04-27 MED ADMIN — fat emulsion 20 % with fish oil (SMOFlipid) infusion 250 mL: 250 mL | INTRAVENOUS | @ 02:00:00 | Stop: 2023-04-27

## 2023-04-27 MED ADMIN — amlodipine (NORVASC) tablet 10 mg: 10 mg | ORAL | @ 14:00:00

## 2023-04-27 MED ADMIN — methocarbamol (ROBAXIN) 750 mg in sodium chloride (NS) 0.9 % 50 mL IVPB: 750 mg | INTRAVENOUS | @ 10:00:00 | Stop: 2023-04-27

## 2023-04-27 MED ADMIN — HYDROmorphone (DILAUDID) tablet 4 mg: 4 mg | ORAL | @ 01:00:00 | Stop: 2023-05-10

## 2023-04-27 MED ADMIN — senna (SENOKOT) tablet 1 tablet: 1 | ORAL | @ 01:00:00

## 2023-04-27 MED ADMIN — HYDROmorphone (PF) injection Syrg 0.5 mg: .5 mg | INTRAVENOUS | @ 11:00:00 | Stop: 2023-04-27

## 2023-04-27 MED ADMIN — metoclopramide (REGLAN) injection 10 mg: 10 mg | INTRAVENOUS | @ 14:00:00

## 2023-04-27 MED ADMIN — HYDROmorphone (PF) injection Syrg 0.5 mg: .5 mg | INTRAVENOUS | @ 07:00:00 | Stop: 2023-04-27

## 2023-04-27 MED ADMIN — lidocaine (ASPERCREME) 4 % 1 patch: 1 | TRANSDERMAL | @ 14:00:00

## 2023-04-27 MED ADMIN — amitriptyline (ELAVIL) tablet 100 mg: 100 mg | ORAL | @ 01:00:00

## 2023-04-27 MED ADMIN — metoclopramide (REGLAN) injection 10 mg: 10 mg | INTRAVENOUS | @ 01:00:00

## 2023-04-27 MED ADMIN — HYDROmorphone (PF) injection Syrg 0.5 mg: .5 mg | INTRAVENOUS | @ 02:00:00 | Stop: 2023-05-05

## 2023-04-27 MED ADMIN — sodium chloride (NS) 0.9 % flush 10 mL: 10 mL | INTRAVENOUS | @ 21:00:00

## 2023-04-27 MED ADMIN — cephalexin (KEFLEX) capsule 500 mg: 500 mg | ORAL | @ 15:00:00 | Stop: 2023-05-01

## 2023-04-27 MED ADMIN — cephalexin (KEFLEX) capsule 500 mg: 500 mg | ORAL | @ 10:00:00 | Stop: 2023-05-01

## 2023-04-27 MED ADMIN — acetaminophen (TYLENOL) tablet 1,000 mg: 1000 mg | ORAL | @ 19:00:00

## 2023-04-27 MED ADMIN — metoPROLOL tartrate (Lopressor) tablet 100 mg: 100 mg | ORAL | @ 01:00:00

## 2023-04-27 MED ADMIN — HYDROmorphone (DILAUDID) tablet 4 mg: 4 mg | ORAL | @ 19:00:00 | Stop: 2023-05-10

## 2023-04-27 MED ADMIN — magnesium oxide-Mg AA chelate (Magnesium Plus Protein) 2 tablet: 2 | ORAL | @ 01:00:00 | Stop: 2023-04-26

## 2023-04-27 MED ADMIN — pantoprazole (Protonix) injection 40 mg: 40 mg | INTRAVENOUS | @ 14:00:00

## 2023-04-27 MED ADMIN — methocarbamol (ROBAXIN) tablet 1,000 mg: 1000 mg | ORAL | @ 15:00:00

## 2023-04-27 MED ADMIN — Parenteral Nutrition (CENTRAL): INTRAVENOUS | @ 02:00:00 | Stop: 2023-04-27

## 2023-04-27 MED ADMIN — HYDROmorphone (DILAUDID) tablet 4 mg: 4 mg | ORAL | @ 10:00:00 | Stop: 2023-05-10

## 2023-04-27 MED ADMIN — tamsulosin (FLOMAX) 24 hr capsule 0.4 mg: .4 mg | ORAL | @ 14:00:00

## 2023-04-27 MED ADMIN — cephalexin (KEFLEX) capsule 500 mg: 500 mg | ORAL | @ 22:00:00 | Stop: 2023-05-01

## 2023-04-27 MED ADMIN — metoclopramide (REGLAN) injection 10 mg: 10 mg | INTRAVENOUS | @ 19:00:00

## 2023-04-27 MED ADMIN — HYDROmorphone (PF) injection Syrg 0.5 mg: .5 mg | INTRAVENOUS | @ 16:00:00 | Stop: 2023-05-01

## 2023-04-27 NOTE — Unmapped (Signed)
Adult Nutrition Progress Note    Visit Type: Follow-Up  Reason for Visit: Parenteral Nutrition    Nutrition Progress:  Patient on TPN from 9/12 to 9/24.     Patient reports eating a pb and j yesterday and scrambled eggs this morning. He reports some fullness/discomfort after eating but denies nausea/vomiting. He is interested in ensures between meals to help meet nutritional needs.     Current Nutrition Therapy Order:   Nutrition Orders            Supplement Adult; Ensure High Protein (Standard High Protein); # of Products PER Serving: 1 2xd PC starting at 09/24 1800    Supplement Adult; MightyShakes (High Fat Milkshake); # of Products PER Serving: 1 Mid-morning starting at 09/24 0900    Parenteral Nutrition (CENTRAL) at 150 mL/hr starting at 09/23 2200    Nutrition Therapy Surgical GI starting at 09/23 0831              RECOMMENDATIONS, INTERVENTIONS, AND GOALS  Recommend continue current nutrition therapy  Small/frequent meals and snacks  Sign off from Adult TPN Service. Service RD will continue to monitor.     RD Follow Up:  1-2 times per week (and more frequent as indicated)      Gladys Damme MS, RD, LD, CNSC  [phone]

## 2023-04-27 NOTE — Unmapped (Addendum)
Pt Sergio Perez, c/o pain managed by PRNs. VSS. Tolerating diet, g tube to straight drain, green output. Surgical site redness improving, scant purulent/tan and bloody drainage from midline incision. TPN infusing. Voiding w/o difficulty. Ambulating on unit independently. Safety precautions in place, call light within reach.          Problem: Adult Inpatient Plan of Care  Goal: Plan of Care Review  Outcome: Ongoing - Unchanged  Goal: Patient-Specific Goal (Individualized)  Outcome: Ongoing - Unchanged  Goal: Absence of Hospital-Acquired Illness or Injury  Outcome: Ongoing - Unchanged  Intervention: Identify and Manage Fall Risk  Recent Flowsheet Documentation  Taken 04/26/2023 2300 by Philomena Course, RN  Safety Interventions:   fall reduction program maintained   lighting adjusted for tasks/safety   low bed  Taken 04/26/2023 2100 by Philomena Course, RN  Safety Interventions:   fall reduction program maintained   lighting adjusted for tasks/safety   low bed  Taken 04/26/2023 1900 by Philomena Course, RN  Safety Interventions:   fall reduction program maintained   lighting adjusted for tasks/safety   low bed  Intervention: Prevent Skin Injury  Recent Flowsheet Documentation  Taken 04/26/2023 2300 by Philomena Course, RN  Positioning for Skin: Supine/Back  Taken 04/26/2023 2100 by Philomena Course, RN  Positioning for Skin: Supine/Back  Taken 04/26/2023 1900 by Philomena Course, RN  Positioning for Skin: Supine/Back  Goal: Optimal Comfort and Wellbeing  Outcome: Ongoing - Unchanged  Goal: Readiness for Transition of Care  Outcome: Ongoing - Unchanged  Goal: Rounds/Family Conference  Outcome: Ongoing - Unchanged     Problem: Malnutrition  Goal: Improved Nutritional Intake  Outcome: Ongoing - Unchanged     Problem: Wound  Goal: Optimal Coping  Outcome: Ongoing - Unchanged  Goal: Optimal Functional Ability  Outcome: Ongoing - Unchanged  Intervention: Optimize Functional Ability  Recent Flowsheet Documentation  Taken 04/26/2023 1900 by Philomena Course, RN  Activity Management: ambulated outside room  Goal: Absence of Infection Signs and Symptoms  Outcome: Ongoing - Unchanged  Goal: Improved Oral Intake  Outcome: Ongoing - Unchanged  Goal: Optimal Pain Control and Function  Outcome: Ongoing - Unchanged  Goal: Skin Health and Integrity  Outcome: Ongoing - Unchanged  Intervention: Optimize Skin Protection  Recent Flowsheet Documentation  Taken 04/26/2023 2300 by Philomena Course, RN  Pressure Reduction Techniques: frequent weight shift encouraged  Head of Bed [facility]) Positioning: HOB elevated  Taken 04/26/2023 2100 by Philomena Course, RN  Pressure Reduction Techniques: frequent weight shift encouraged  Head of Bed [facility]) Positioning: HOB elevated  Taken 04/26/2023 1900 by Philomena Course, RN  Activity Management: ambulated outside room  Pressure Reduction Techniques: frequent weight shift encouraged  Head of Bed (HOB) Positioning: HOB elevated  Goal: Optimal Wound Healing  Outcome: Ongoing - Unchanged     Problem: Fall Injury Risk  Goal: Absence of Fall and Fall-Related Injury  Outcome: Ongoing - Unchanged  Intervention: Promote Injury-Free Environment  Recent Flowsheet Documentation  Taken 04/26/2023 2300 by Philomena Course, RN  Safety Interventions:   fall reduction program maintained   lighting adjusted for tasks/safety   low bed  Taken 04/26/2023 2100 by Philomena Course, RN  Safety Interventions:   fall reduction program maintained   lighting adjusted for tasks/safety   low bed  Taken 04/26/2023 1900 by Philomena Course, RN  Safety Interventions:   fall reduction program maintained   lighting adjusted for tasks/safety   low bed     Problem: Nausea and Vomiting  Goal: Nausea and Vomiting Relief  Outcome: Ongoing - Unchanged     Problem: Nausea and Vomiting  Goal: Nausea and Vomiting Relief  Outcome: Ongoing - Unchanged     Problem: Pain Acute  Goal: Optimal Pain Control and Function  Outcome: Ongoing - Unchanged     Problem: Comorbidity Management  Goal: Blood Pressure in Desired Range  Outcome: Ongoing - Unchanged     Problem: Surgery Nonspecified  Goal: Absence of Bleeding  Outcome: Ongoing - Unchanged  Goal: Effective Bowel Elimination  Outcome: Ongoing - Unchanged  Goal: Fluid and Electrolyte Balance  Outcome: Ongoing - Unchanged  Goal: Blood Glucose Level Within Targeted Range  Outcome: Ongoing - Unchanged  Goal: Absence of Infection Signs and Symptoms  Outcome: Ongoing - Unchanged  Goal: Anesthesia/Sedation Recovery  Outcome: Ongoing - Unchanged  Intervention: Optimize Anesthesia Recovery  Recent Flowsheet Documentation  Taken 04/26/2023 2300 by Philomena Course, RN  Safety Interventions:   fall reduction program maintained   lighting adjusted for tasks/safety   low bed  Taken 04/26/2023 2100 by Philomena Course, RN  Safety Interventions:   fall reduction program maintained   lighting adjusted for tasks/safety   low bed  Taken 04/26/2023 1900 by Philomena Course, RN  Safety Interventions:   fall reduction program maintained   lighting adjusted for tasks/safety   low bed  Goal: Optimal Pain Control and Function  Outcome: Ongoing - Unchanged  Goal: Nausea and Vomiting Relief  Outcome: Ongoing - Unchanged  Goal: Effective Urinary Elimination  Outcome: Ongoing - Unchanged  Goal: Effective Oxygenation and Ventilation  Outcome: Ongoing - Unchanged  Intervention: Optimize Oxygenation and Ventilation  Recent Flowsheet Documentation  Taken 04/26/2023 2300 by Philomena Course, RN  Head of Bed [facility]) Positioning: HOB elevated  Taken 04/26/2023 2100 by Philomena Course, RN  Head of Bed [facility]) Positioning: HOB elevated  Taken 04/26/2023 1900 by Philomena Course, RN  Head of Bed [facility]) Positioning: HOB elevated     Problem: Self-Care Deficit  Goal: Improved Ability to Complete Activities of Daily Living  Outcome: Ongoing - Unchanged

## 2023-04-27 NOTE — Unmapped (Signed)
Pt A&O, VSS, complaints of pain managed with PRN meds. Diet tolerated, no n/v. Voiding adequate UOP. Surgical site dressing reinforced; Pt refused packing change. G-tube intact to straight drain, clamped most of shift. Pt showered, Independently ambulatory in room, no difficulties noted. Bed low/locked, call bell/bedside table within reach; will continue to monitor with safety parameters in place.     Problem: Adult Inpatient Plan of Care  Goal: Plan of Care Review  Outcome: Ongoing - Unchanged  Goal: Patient-Specific Goal (Individualized)  Outcome: Ongoing - Unchanged  Goal: Absence of Hospital-Acquired Illness or Injury  Outcome: Ongoing - Unchanged  Goal: Optimal Comfort and Wellbeing  Outcome: Ongoing - Unchanged  Goal: Readiness for Transition of Care  Outcome: Ongoing - Unchanged  Goal: Rounds/Family Conference  Outcome: Ongoing - Unchanged     Problem: Malnutrition  Goal: Improved Nutritional Intake  Outcome: Ongoing - Unchanged     Problem: Wound  Goal: Optimal Coping  Outcome: Ongoing - Unchanged  Goal: Optimal Functional Ability  Outcome: Ongoing - Unchanged  Goal: Absence of Infection Signs and Symptoms  Outcome: Ongoing - Unchanged  Goal: Improved Oral Intake  Outcome: Ongoing - Unchanged  Goal: Optimal Pain Control and Function  Outcome: Ongoing - Unchanged  Goal: Skin Health and Integrity  Outcome: Ongoing - Unchanged  Goal: Optimal Wound Healing  Outcome: Ongoing - Unchanged     Problem: Fall Injury Risk  Goal: Absence of Fall and Fall-Related Injury  Outcome: Ongoing - Unchanged     Problem: Nausea and Vomiting  Goal: Nausea and Vomiting Relief  Outcome: Ongoing - Unchanged     Problem: Nausea and Vomiting  Goal: Nausea and Vomiting Relief  Outcome: Ongoing - Unchanged     Problem: Pain Acute  Goal: Optimal Pain Control and Function  Outcome: Ongoing - Unchanged     Problem: Comorbidity Management  Goal: Blood Pressure in Desired Range  Outcome: Ongoing - Unchanged     Problem: Surgery Nonspecified  Goal: Absence of Bleeding  Outcome: Ongoing - Unchanged  Goal: Effective Bowel Elimination  Outcome: Ongoing - Unchanged  Goal: Fluid and Electrolyte Balance  Outcome: Ongoing - Unchanged  Goal: Blood Glucose Level Within Targeted Range  Outcome: Ongoing - Unchanged  Goal: Absence of Infection Signs and Symptoms  Outcome: Ongoing - Unchanged  Goal: Anesthesia/Sedation Recovery  Outcome: Ongoing - Unchanged  Goal: Optimal Pain Control and Function  Outcome: Ongoing - Unchanged  Goal: Nausea and Vomiting Relief  Outcome: Ongoing - Unchanged  Goal: Effective Urinary Elimination  Outcome: Ongoing - Unchanged  Goal: Effective Oxygenation and Ventilation  Outcome: Ongoing - Unchanged     Problem: Self-Care Deficit  Goal: Improved Ability to Complete Activities of Daily Living  Outcome: Ongoing - Unchanged

## 2023-04-27 NOTE — Unmapped (Signed)
Surgery Progress Note  11 Days [facility] Service: SRA - GI 1 [address] [place]) [phone]  Admitting Attending: Surgical Oncology Attendings: Daryl Eastern, DO      Assessment:     Sergio Perez is a 62 y.o. 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     Now s/p ex lap, small bowel resection, new ileocolic side-to-side anastomosis and omentectomy on 04/16/23. 61fr malecot G tube in place. Advanced diet without nausea, vomiting, will continue with weaning IV pain meds and appropriate pain management.     Interval Events:     NAEON. VSS, afebrile. UOP 3x unmeasured, 4x BM. Continues to pass flatus. Tolerated surgical GI diet without nausea, vomiting. Venting G-tube prn, able to keep it clamped for 1.5-2hrs at a time. Pain manageable. Mid-line incision with continued erythema, and now new area of fluctuance. Drained at bedside and packed with iodoform. Will change BID.      Plan:       Neuro/Pain:   *Hx of CVA   - Holding home Plavix   *Pain/Nausea   - PO scheduled Tylenol, lido patches    - PO Robaxin 1000mg  QID    - PRN PO dilaudid 2-4mg  for moderate pain, IV HM for severe pain   - Weaned 0.5mg  IV HM for severe pain, will continue to   space IV HM to q8hrs prn    - Home amitriptyline sch    - Atarax PRN   - Reglan sch    - Zofran and compazine prn   - Chronic pain re-engaged- appreciate recs - can add short-term course of ibuprofen if needed    CV: VSS and normotensive  *Hx of HTN, HLD  - Home metoprolol 100mg  BID and amlodipine   - Holding home fenofibrate, atorvastatin       Pulm: NWOB and on RA      GI  - F: let TPN expire, ML   - E: replete PRN  - N: Surgical GI Diet, Ensures, Mighty Shakes   - Protonix, PRN tums  - Calorie Count    *malignant SBO s/p ex-lap, ileocolic anastomotic resection (9/13)  - Bilious GT output 9/22, non-bilious 9/23  - Vent G-tube prn for pain, nausea - leave GT clamped unless symptomatic   - ROBF  - Cellulitis on midline incision, Keflex 500mg  QID for 5 days (9/23- ), BID packing and dressing change to mid-line hematoma/wound       Endo:   *Hx of hypothyroidism  - Home levothyroxine      Renal/GU:   - Passed TOV  - Flomax 0.4mg  daily       Heme/ID:   - CBC every other day  - Lovenox DVT ppx      Dispo: Floor    Please page SRA - GI 1 [address]owda, [place]) [phone] with any questions.     Attending Attestation:    I supervised the resident for the history and exam, and conducted pertinent aspects of the history and exam after the initial review. I discussed the findings, assessment and plan with the resident and agree with the findings and plan as documented  in the resident???s note on the date listed above.    Having bowel activity and will stop TPN.  Still not yet completely normal from a GI standpoint, but slowly moving towards eventual discharge.    Objective:      Vital Signs:  BP 129/89  - Pulse 94  - Temp 36.5 ??C (97.7 ??F) (Oral)  - Resp 18  - Ht 182.9 cm (6')  - Wt 91.9 kg (202 lb 9.6 oz)  - SpO2 100%  - BMI 27.48 kg/m??     Input/Output:  I/O         09/22 0701  09/23 0700 09/23 0701  09/24 0700    P.O. 236 598    IV Piggyback 129     TPN 2072.4     Total Intake 2437.4 598    Urine (mL/kg/hr) 750 (0.3) 0 (0)    Emesis/NG output 1000 750    Stool 0 0    Total Output(mL/kg) 1750 (19) 750 (8.2)    Net +687.4 -152          Urine Occurrence 5 x 3 x    Stool Occurrence  4 x            Physical Exam:    General: Cooperative, no distress, well appearing male  Neuro: Alert, oriented, appropriate  HEENT: Normocephalic, atraumatic  Pulmonary: Normal work of breathing, equal bilateral chest rise on RA  Cardiovascular: HDS, appears well perfused   Abdomen: distended, non-tender, G-tube with non-bilious output; erythema around midline incision above umbilicus with fluctuance, hematoma expressed and packed with iodoform   Extremities: no peripheral edema    Labs:    Lab Results   Component Value Date    WBC 11.1 04/22/2023    HGB 10.6 (L) 04/22/2023    HCT 31.3 (L) 04/22/2023    PLT 353 04/22/2023       Lab Results   Component Value Date    NA 136 04/26/2023    K 3.8 04/26/2023    CL 104 04/26/2023    CO2 26.0 04/26/2023    BUN 19 04/26/2023    CREATININE 0.58 (L) 04/26/2023    CALCIUM 8.8 04/26/2023    MG 1.8 04/26/2023    MG 1.8 04/26/2023    PHOS 4.4 04/26/2023    PHOS 4.3 04/26/2023       Microbiology Results (last day)       ** No results found for the last 24 hours. **            Imaging:   None new in 24 hours.         Karna Christmas, MD   PGY-1 General Surgery

## 2023-04-27 NOTE — Unmapped (Signed)
=  Department of Anesthesiology  Pain Medicine Division    Chronic Pain in-patient Consult follow-up Note      Requesting Attending Physician:  Lynnell Dike, DO  Service Requesting Consult:  Sur Oncology [facility])    Assessment/Recommendations:  The patient was seen in consultation on request of Lynnell Dike, DO regarding assistance with pain management. The patient is obtaining adequate pain relief on current medication regimen.     The patient is a 62 y.o. 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      Now s/p ex lap, small bowel resection and omentectomy on 04/16/23.     Interval:   Patient feels his pain is in a good spot with the current regimen of PO and IV hydromorphone. He rates it at a 6/10 which he feels is a tolerable level for him. Continues to have regular BMs. In the last 24 hours, he's used IV HM 6/6 times and PO HM 4/6 times. His IV HM has now been spaced out to q8h. He seemed quite nervous that his pain would be uncontrolled again with this change, but advised patient to try using the oral HM more regularly as the ultimate goal is to transition him to oral pain medications. He understood this and was amenable to this plan.     We have no new recommendations and agree with the changes made by primary service.     Home regimen: Oxycodone 5 mg every 6 hours as needed    Recommendations:  -The chronic pain service is a consult service and does not place orders, just makes recommendations (except ketamine and lidocaine infusions)  -Please evaluate all patients on opioids for appropriateness of prescribing narcan at discharge.  The chronic pain service can assist with this.  Nasal narcan covered by most insurance.  -Recommendations given apply to the current hospitalization and do not reflect long term recommendations.  -Continue Dilaudid 0.5 mg every 8 hours   -continue oral hydromorphone 2 (moderate pain) /4 (severe pain) mg p.o. q4h PRN  -Continue Tylenol 1 g 3 times daily  -Continue Robaxin 1000 milligram QID, per primary team   -continue amitriptiline 100 mg at bedtime.  -Completed IV Toradol previously but can add oral NSAIDs short term if okay by primary team  -Consider consulting palliative team when patient ready.      We will sign off at this time. Please contact the chronic pain service if we can be of further assistance with pain control.     Prescribe naloxone at discharge.  Nasal narcan for most insured (Nasal narcan 4mg /actuation, prescribe 1 kit, instructions at [email or web address]).  For uninsured, chronic pain can work to assist in finding an option.  OTC nasal narcan now available at most pharmacies for around $45.    History of Present Illness:    Reason for Consult: Acute postoperative pain, chronic pain syndrome, history of colon cancer    The patient is a 62 y.o. 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     Chronic pain team was consulted for abdominal pain. Patient was previously on a PCA which has since been weaned off. He feels his pain is well controlled with current multimodal pain regimen.     Prior to admission, the patient was on home opiate medications.  Hehas been followed by a pain clinic.  The [place] CSRS was reviewed.    Allergies    No Known Allergies      Home Medications    Medications Prior to Admission   Medication Sig Dispense Refill Last Dose    acetaminophen (TYLENOL) 500 MG tablet Take 2 tablets (1,000 mg total) by mouth every twelve (12) hours as needed for pain.       amitriptyline (ELAVIL) 100 MG tablet Take 1 tablet (100 mg total) by mouth nightly. Frequency:QHSPRN   Dosage:50   MG  Instructions:  Note:Dose: 50MG        amLODIPine (NORVASC) 10 MG tablet Take 1 tablet (10 mg total) by mouth daily.       atorvastatin (LIPITOR) 40 MG tablet Take 1 tablet (40 mg total) by mouth daily.       clopidogreL (PLAVIX) 75 mg tablet clopidogrel 75 mg tablet   TAKE 1 TABLET BY MOUTH ONCE DAILY       diphenoxylate-atropine (LOMOTIL) 2.5-0.025 mg per tablet Take 1 tablet by mouth 4 times daily 30 tablet 0     doxycycline (MONODOX) 100 MG capsule Take 1 capsule (100 mg total) by mouth two (2) times a day. 60 capsule 5     fenofibrate (LOFIBRA) 160 MG tablet Take 1 tablet (160 mg total) by mouth daily.       fluoride, sodium, (DENTAGEL) 1.1 % Gel Apply 1 application topically two (2) times a day. 56 g 7     gabapentin (NEURONTIN) 400 MG capsule 1 in the AM; 1 at lunch and 3 at bedtime       levothyroxine (SYNTHROID, LEVOTHROID) 150 MCG tablet Take 1 tablet (150 mcg total) by mouth daily. 30 tablet 3     loperamide (IMODIUM A-D) 2 mg tablet Take 2 tablets (4 mg) by mouth for first loose stool followed by 1 tablet (2 mg) after each loose stool thereafter (maximum of 8 tablets per day). 30 tablet 11     magnesium oxide (MAG-OX) 400 mg (241.3 mg elemental magnesium) tablet Take 1 tablet (400 mg total) by mouth two (2) times a day. 60 tablet 11     metoprolol tartrate (LOPRESSOR) 100 MG tablet 1 tablet (100 mg total) two (2) times a day.       MITIGARE 0.6 mg cap capsule        ondansetron (ZOFRAN) 8 MG tablet Take 1 tablet (8 mg total) by mouth every eight (8) hours as needed for nausea. Do not take on day 1 of chemotherapy cycles. 60 tablet 3     oxyCODONE (ROXICODONE) 5 MG immediate release tablet Take 1 tablet (5 mg total) by mouth every six (6) hours as needed for pain. 90 tablet 0     pantoprazole (PROTONIX) 40 MG tablet Take 1 tablet (40 mg total) by mouth daily.       prochlorperazine (COMPAZINE) 10 MG tablet Take 1 tablet (10 mg total) by mouth every six (6)  hours as needed (Nausea & Vomiting). 60 tablet 3     trifluridine-tipiracil (LONSURF) 15-6.14 mg tablet Take 5 tablets (75 mg total) by mouth in the morning and 5 tablets (75 mg total) in the evening. Take with meals. Take for 5 days on, 9 days off for every 14 day cycle. 100 tablet 6        Inpatient Medications  Current Facility-Administered Medications   Medication Dose Route Frequency Provider Last Rate Last Admin    acetaminophen (TYLENOL) tablet 1,000 mg  1,000 mg Oral Q8H Harris, Lurline Del, FNP   1,000 mg at 04/27/23 0604    amitriptyline (ELAVIL) tablet 100 mg  100 mg Oral Nightly Kirke Shaggy, FNP   100 mg at 04/26/23 2039    amlodipine (NORVASC) tablet 10 mg  10 mg Oral Daily Kirke Shaggy, FNP        calcium carbonate (TUMS) chewable tablet 200 mg elem calcium  200 mg elem calcium Oral TID PRN Karna Christmas, MD   200 mg elem calcium at 04/26/23 1750    cephalexin (KEFLEX) capsule 500 mg  500 mg Oral Q6H SCH Karna Christmas, MD   500 mg at 04/27/23 0604    enoxaparin (LOVENOX) syringe 40 mg  40 mg Subcutaneous Q24H Emilio Aspen, MD   40 mg at 04/27/23 0019    Parenteral Nutrition (CENTRAL)   Intravenous Continuous Kirke Shaggy, FNP 150 mL/hr at 04/26/23 2212 New Bag at 04/26/23 2212    And    fat emulsion 20 % with fish oil (SMOFlipid) infusion 250 mL  250 mL Intravenous Continuous Kirke Shaggy, FNP 20.8 mL/hr at 04/26/23 2212 250 mL at 04/26/23 2212    HYDROmorphone (DILAUDID) tablet 2 mg  2 mg Oral Q4H PRN Karna Christmas, MD        Or    HYDROmorphone (DILAUDID) tablet 4 mg  4 mg Oral Q4H PRN Karna Christmas, MD   4 mg at 04/27/23 0550    HYDROmorphone (PF) injection Syrg 0.5 mg  0.5 mg Intravenous Q8H PRN Karna Christmas, MD        hydrOXYzine (ATARAX) tablet 25 mg  25 mg Oral Q4H PRN Karna Christmas, MD   25 mg at 04/18/23 1610    levothyroxine (SYNTHROID) tablet 150 mcg  150 mcg Oral Daily Burman Riis, MD   150 mcg at 04/26/23 0857    lidocaine (ASPERCREME) 4 % 1 patch  1 patch Transdermal Daily Montel Culver, MD   1 patch at 04/26/23 9604    methocarbamol (ROBAXIN) tablet 1,000 mg  1,000 mg Oral QID Karna Christmas, MD        metoclopramide (REGLAN) injection 10 mg  10 mg Intravenous TID Kadakia, Esha R, MD   10 mg at 04/26/23 2041    metoPROLOL tartrate (Lopressor) tablet 100 mg  100 mg Oral BID Kirke Shaggy, FNP   100 mg at 04/26/23 2040    ondansetron (ZOFRAN) injection 4 mg  4 mg Intravenous Q6H PRN Emilio Aspen, MD   4 mg at 04/26/23 2254    pantoprazole (Protonix) injection 40 mg  40 mg Intravenous Daily Emilio Aspen, MD   40 mg at 04/26/23 0851    polyethylene glycol (MIRALAX) packet 17 g  17 g Oral Daily Kirke Shaggy, FNP   17 g at 04/26/23 5409    prochlorperazine (COMPAZINE) injection 5 mg  5 mg Intravenous Q6H PRN Emilio Aspen, MD   5 mg  at 04/17/23 0800    senna (SENOKOT) tablet 1 tablet  1 tablet Oral Nightly Kirke Shaggy, FNP   1 tablet at 04/26/23 2041    sodium chloride (NS) 0.9 % flush 10 mL  10 mL Intravenous Q8H Emilio Aspen, MD   10 mL at 04/27/23 0703    sodium chloride (NS) 0.9 % flush 10 mL  10 mL Intravenous Q8H Emilio Aspen, MD   10 mL at 04/27/23 0703    tamsulosin (FLOMAX) 24 hr capsule 0.4 mg  0.4 mg Oral Daily Emilio Aspen, MD   0.4 mg at 04/26/23 1610         Past Medical History    Past Medical History:   Diagnosis Date    Hypothyroidism     Squamous cell carcinoma of base of tongue (CMS-HCC) 11/2008           Past Surgical History    Past Surgical History:   Procedure Laterality Date    chemoradiotherapy  2010    IR INSERT PORT AGE GREATER THAN 5 YRS  08/19/2022    IR INSERT PORT AGE GREATER THAN 5 YRS 08/19/2022 Evron, Gerri Lins, PA IMG VIR [facility]    laser resection      NECK DISSECTION      PR EXPLORATORY OF ABDOMEN N/A 04/16/2023    Procedure: EXPLORATORY LAPAROTOMY, EXPLORATORY CELIOTOMY WITH OR WITHOUT BIOPSY(S);  Surgeon: Lynnell Dike, DO;  Location: OR [facility];  Service: Surgical Oncology    PR REMOVAL OF OMENTUM N/A 04/16/2023    Procedure: OMENTECTOMY, EPIPLOECTOMY, RESECTION OF OMENTUM;  Surgeon: Lynnell Dike, DO;  Location: OR [facility];  Service: Surgical Oncology    PR RESECT SMALL INTEST,SINGL RESEC/ANAS N/A 04/16/2023    Procedure: ENTERECTOMY SM INTES; SNGL RESECT & ANASTOM;  Surgeon: Lynnell Dike, DO;  Location: OR [facility];  Service: Surgical Oncology           Family History    Family History   Problem Relation Age of Onset    Cancer Brother         Brain         Social History:  Social History     Tobacco Use   Smoking Status Former    Current packs/day: 0.00    Average packs/day: 1 pack/day for 7.0 years (7.0 ttl pk-yrs)    Types: Cigarettes    Start date: 05/16/2003    Quit date: 05/15/2010    Years since quitting: 12.9   Smokeless Tobacco Never   Tobacco Comments    Quit smoking 16 years ago     Social History     Substance and Sexual Activity   Alcohol Use Yes    Alcohol/week: 35.0 standard drinks of alcohol    Types: 35 Cans of beer per week     Social History     Substance and Sexual Activity   Drug Use Yes    Frequency: 7.0 times per week    Types: Marijuana    Comment: rolled cigarette of MJ         Lab Results   Component Value Date    CREATININE 0.58 (L) 04/26/2023       Lab Results   Component Value Date    ALKPHOS 113 04/23/2023    BILITOT 0.2 (L) 04/23/2023    BILIDIR <0.10 04/23/2023    PROT 6.7 04/23/2023    ALBUMIN 2.5 (L) 04/23/2023    ALT 12 04/23/2023    AST  14 04/23/2023       Encounter Date: 04/14/23   ECG 12 Lead   Result Value    EKG Systolic BP     EKG Diastolic BP     EKG Ventricular Rate 130    EKG Atrial Rate 130    EKG P-R Interval 134    EKG QRS Duration 84    EKG Q-T Interval 314    EKG QTC Calculation 462    EKG Calculated P Axis 14    EKG Calculated R Axis 12    EKG Calculated T Axis 31    QTC Fredericia 406    Narrative    SINUS TACHYCARDIA  OTHERWISE NORMAL ECG  NO PREVIOUS ECGS AVAILABLE  Confirmed by Mariane Baumgarten ([id number]) on 04/16/2023 9:48:15 PM       No results found for requested labs within last 30 days.       Objective:     Vital Signs  Temp:  [36.5 ??C (97.7 ??F)-36.9 ??C (98.4 ??F)] 36.6 ??C (97.9 ??F)  Heart Rate:  [82-102] 96  Resp:  [17-18] 18  BP: (117-153)/(83-103) 141/90  MAP (mmHg):  [96-117] 103  SpO2:  [97 %-100 %] 97 %    Physical Exam    GENERAL:  Well developed, well-nourished male and is in no apparent distress.   HEAD/NECK:    Reveals normocephalic/atraumatic.   CARDIOVASCULAR:   Well perfused.   LUNGS:   Normal work of breathing on room air.   EXTREMITIES:  No clubbing or cyanosis was noted.  NEUROLOGIC:    The patient was alert and oriented times four with normal language, attention, cognition and memory. Cranial nerve exam was grossly normal.   MUSCULOSKELETAL:    Patient able to move extremities independently.  SKIN:  No obvious rashes lesions or erythema  PSY:  Appropriate affect and mood.      Problem List    Malignant bowel obstruction  Chronic pain syndrome

## 2023-04-28 LAB — CBC
HEMATOCRIT: 32.8 % — ABNORMAL LOW (ref 39.0–48.0)
HEMOGLOBIN: 11.1 g/dL — ABNORMAL LOW (ref 12.9–16.5)
MEAN CORPUSCULAR HEMOGLOBIN CONC: 33.7 g/dL (ref 32.0–36.0)
MEAN CORPUSCULAR HEMOGLOBIN: 30.3 pg (ref 25.9–32.4)
MEAN CORPUSCULAR VOLUME: 89.8 fL (ref 77.6–95.7)
MEAN PLATELET VOLUME: 7.5 fL (ref 6.8–10.7)
PLATELET COUNT: 473 10*9/L — ABNORMAL HIGH (ref 150–450)
RED BLOOD CELL COUNT: 3.65 10*12/L — ABNORMAL LOW (ref 4.26–5.60)
RED CELL DISTRIBUTION WIDTH: 14.1 % (ref 12.2–15.2)
WBC ADJUSTED: 8.9 10*9/L (ref 3.6–11.2)

## 2023-04-28 LAB — BASIC METABOLIC PANEL
ANION GAP: 6 mmol/L (ref 5–14)
BLOOD UREA NITROGEN: 22 mg/dL (ref 9–23)
BUN / CREAT RATIO: 35
CALCIUM: 9.3 mg/dL (ref 8.7–10.4)
CHLORIDE: 104 mmol/L (ref 98–107)
CO2: 23 mmol/L (ref 20.0–31.0)
CREATININE: 0.62 mg/dL — ABNORMAL LOW
EGFR CKD-EPI (2021) MALE: >90 mL/min/{1.73_m2} (ref >=60)
GLUCOSE RANDOM: 95 mg/dL (ref 70–179)
POTASSIUM: 4.8 mmol/L (ref 3.4–4.8)
SODIUM: 133 mmol/L — ABNORMAL LOW (ref 135–145)

## 2023-04-28 LAB — MAGNESIUM: MAGNESIUM: 1.6 mg/dL (ref 1.6–2.6)

## 2023-04-28 LAB — PHOSPHORUS: PHOSPHORUS: 4.6 mg/dL (ref 2.4–5.1)

## 2023-04-28 MED ADMIN — sodium chloride (NS) 0.9 % flush 10 mL: 10 mL | INTRAVENOUS | @ 04:00:00

## 2023-04-28 MED ADMIN — sodium chloride (NS) 0.9 % flush 10 mL: 10 mL | INTRAVENOUS | @ 14:00:00

## 2023-04-28 MED ADMIN — acetaminophen (TYLENOL) tablet 1,000 mg: 1000 mg | ORAL | @ 18:00:00

## 2023-04-28 MED ADMIN — enoxaparin (LOVENOX) syringe 40 mg: 40 mg | SUBCUTANEOUS | @ 04:00:00

## 2023-04-28 MED ADMIN — methocarbamol (ROBAXIN) tablet 1,000 mg: 1000 mg | ORAL | @ 22:00:00

## 2023-04-28 MED ADMIN — senna (SENOKOT) tablet 1 tablet: 1 | ORAL | @ 01:00:00

## 2023-04-28 MED ADMIN — HYDROmorphone (DILAUDID) tablet 4 mg: 4 mg | ORAL | @ 18:00:00 | Stop: 2023-05-10

## 2023-04-28 MED ADMIN — acetaminophen (TYLENOL) tablet 1,000 mg: 1000 mg | ORAL | @ 02:00:00

## 2023-04-28 MED ADMIN — metoPROLOL tartrate (Lopressor) tablet 100 mg: 100 mg | ORAL | @ 14:00:00

## 2023-04-28 MED ADMIN — methocarbamol (ROBAXIN) tablet 1,000 mg: 1000 mg | ORAL | @ 10:00:00

## 2023-04-28 MED ADMIN — metoPROLOL tartrate (Lopressor) tablet 100 mg: 100 mg | ORAL | @ 01:00:00

## 2023-04-28 MED ADMIN — amitriptyline (ELAVIL) tablet 100 mg: 100 mg | ORAL | @ 01:00:00

## 2023-04-28 MED ADMIN — methocarbamol (ROBAXIN) tablet 1,000 mg: 1000 mg | ORAL | @ 01:00:00

## 2023-04-28 MED ADMIN — sodium chloride (NS) 0.9 % flush 10 mL: 10 mL | INTRAVENOUS | @ 22:00:00

## 2023-04-28 MED ADMIN — lidocaine (ASPERCREME) 4 % 1 patch: 1 | TRANSDERMAL | @ 14:00:00

## 2023-04-28 MED ADMIN — HYDROmorphone (PF) injection Syrg 0.5 mg: .5 mg | INTRAVENOUS | @ 01:00:00 | Stop: 2023-05-01

## 2023-04-28 MED ADMIN — cephalexin (KEFLEX) capsule 500 mg: 500 mg | ORAL | @ 22:00:00 | Stop: 2023-05-01

## 2023-04-28 MED ADMIN — metoclopramide (REGLAN) injection 10 mg: 10 mg | INTRAVENOUS | @ 01:00:00

## 2023-04-28 MED ADMIN — tamsulosin (FLOMAX) 24 hr capsule 0.4 mg: .4 mg | ORAL | @ 14:00:00

## 2023-04-28 MED ADMIN — cephalexin (KEFLEX) capsule 500 mg: 500 mg | ORAL | @ 04:00:00 | Stop: 2023-05-01

## 2023-04-28 MED ADMIN — metoclopramide (REGLAN) injection 10 mg: 10 mg | INTRAVENOUS | @ 14:00:00

## 2023-04-28 MED ADMIN — methocarbamol (ROBAXIN) tablet 1,000 mg: 1000 mg | ORAL | @ 16:00:00

## 2023-04-28 MED ADMIN — pantoprazole (Protonix) injection 40 mg: 40 mg | INTRAVENOUS | @ 14:00:00

## 2023-04-28 MED ADMIN — amlodipine (NORVASC) tablet 10 mg: 10 mg | ORAL | @ 14:00:00

## 2023-04-28 MED ADMIN — cephalexin (KEFLEX) capsule 500 mg: 500 mg | ORAL | @ 16:00:00 | Stop: 2023-05-01

## 2023-04-28 MED ADMIN — HYDROmorphone (DILAUDID) tablet 4 mg: 4 mg | ORAL | @ 04:00:00 | Stop: 2023-05-10

## 2023-04-28 MED ADMIN — magnesium sulfate 2gm/50mL IVPB: 2 g | INTRAVENOUS | @ 14:00:00 | Stop: 2023-04-28

## 2023-04-28 MED ADMIN — levothyroxine (SYNTHROID) tablet 150 mcg: 150 ug | ORAL | @ 14:00:00

## 2023-04-28 MED ADMIN — HYDROmorphone (PF) injection Syrg 0.5 mg: .5 mg | INTRAVENOUS | @ 10:00:00 | Stop: 2023-04-28

## 2023-04-28 MED ADMIN — acetaminophen (TYLENOL) tablet 1,000 mg: 1000 mg | ORAL | @ 10:00:00

## 2023-04-28 MED ADMIN — HYDROmorphone (DILAUDID) tablet 4 mg: 4 mg | ORAL | @ 14:00:00 | Stop: 2023-05-10

## 2023-04-28 MED ADMIN — HYDROmorphone (DILAUDID) tablet 4 mg: 4 mg | ORAL | @ 22:00:00 | Stop: 2023-05-10

## 2023-04-28 MED ADMIN — metoclopramide (REGLAN) injection 10 mg: 10 mg | INTRAVENOUS | @ 18:00:00

## 2023-04-28 MED ADMIN — cephalexin (KEFLEX) capsule 500 mg: 500 mg | ORAL | @ 10:00:00 | Stop: 2023-05-01

## 2023-04-28 NOTE — Unmapped (Addendum)
Pt resting throughout shift. Aox4, c/o pain managed by PRNs. VSS. Tolerating diet. Abd dressing changed, redness improved. Ambulating independently. Safety precautions in place, call light within reach.          Problem: Adult Inpatient Plan of Care  Goal: Plan of Care Review  Outcome: Progressing  Goal: Patient-Specific Goal (Individualized)  Outcome: Progressing  Goal: Absence of Hospital-Acquired Illness or Injury  Outcome: Progressing  Intervention: Identify and Manage Fall Risk  Recent Flowsheet Documentation  Taken 04/27/2023 2100 by Philomena Course, RN  Safety Interventions:   fall reduction program maintained   lighting adjusted for tasks/safety   low bed  Taken 04/27/2023 1900 by Philomena Course, RN  Safety Interventions:   fall reduction program maintained   lighting adjusted for tasks/safety   low bed  Intervention: Prevent Skin Injury  Recent Flowsheet Documentation  Taken 04/27/2023 2100 by Philomena Course, RN  Positioning for Skin: Supine/Back  Taken 04/27/2023 1900 by Philomena Course, RN  Positioning for Skin: Supine/Back  Goal: Optimal Comfort and Wellbeing  Outcome: Progressing  Goal: Readiness for Transition of Care  Outcome: Progressing  Goal: Rounds/Family Conference  Outcome: Progressing     Problem: Malnutrition  Goal: Improved Nutritional Intake  Outcome: Progressing     Problem: Wound  Goal: Optimal Coping  Outcome: Progressing  Goal: Optimal Functional Ability  Outcome: Progressing  Intervention: Optimize Functional Ability  Recent Flowsheet Documentation  Taken 04/27/2023 1900 by Philomena Course, RN  Activity Management: ambulated outside room  Goal: Absence of Infection Signs and Symptoms  Outcome: Progressing  Goal: Improved Oral Intake  Outcome: Progressing  Goal: Optimal Pain Control and Function  Outcome: Progressing  Goal: Skin Health and Integrity  Outcome: Progressing  Intervention: Optimize Skin Protection  Recent Flowsheet Documentation  Taken 04/27/2023 2100 by Philomena Course, RN  Pressure Reduction Techniques: frequent weight shift encouraged  Head of Bed [facility]) Positioning: HOB elevated  Taken 04/27/2023 1900 by Philomena Course, RN  Activity Management: ambulated outside room  Pressure Reduction Techniques: frequent weight shift encouraged  Head of Bed (HOB) Positioning: HOB elevated  Goal: Optimal Wound Healing  Outcome: Progressing     Problem: Fall Injury Risk  Goal: Absence of Fall and Fall-Related Injury  Outcome: Progressing  Intervention: Promote Injury-Free Environment  Recent Flowsheet Documentation  Taken 04/27/2023 2100 by Philomena Course, RN  Safety Interventions:   fall reduction program maintained   lighting adjusted for tasks/safety   low bed  Taken 04/27/2023 1900 by Philomena Course, RN  Safety Interventions:   fall reduction program maintained   lighting adjusted for tasks/safety   low bed     Problem: Nausea and Vomiting  Goal: Nausea and Vomiting Relief  Outcome: Progressing     Problem: Nausea and Vomiting  Goal: Nausea and Vomiting Relief  Outcome: Progressing     Problem: Pain Acute  Goal: Optimal Pain Control and Function  Outcome: Progressing     Problem: Comorbidity Management  Goal: Blood Pressure in Desired Range  Outcome: Progressing     Problem: Surgery Nonspecified  Goal: Absence of Bleeding  Outcome: Progressing  Goal: Effective Bowel Elimination  Outcome: Progressing  Goal: Fluid and Electrolyte Balance  Outcome: Progressing  Goal: Blood Glucose Level Within Targeted Range  Outcome: Progressing  Goal: Absence of Infection Signs and Symptoms  Outcome: Progressing  Goal: Anesthesia/Sedation Recovery  Outcome: Progressing  Intervention: Optimize Anesthesia Recovery  Recent Flowsheet Documentation  Taken 04/27/2023 2100 by Philomena Course, RN  Safety Interventions:   fall reduction program  maintained   lighting adjusted for tasks/safety   low bed  Taken 04/27/2023 1900 by Philomena Course, RN  Safety Interventions:   fall reduction program maintained   lighting adjusted for tasks/safety   low bed  Goal: Optimal Pain Control and Function  Outcome: Progressing  Goal: Nausea and Vomiting Relief  Outcome: Progressing  Goal: Effective Urinary Elimination  Outcome: Progressing  Goal: Effective Oxygenation and Ventilation  Outcome: Progressing  Intervention: Optimize Oxygenation and Ventilation  Recent Flowsheet Documentation  Taken 04/27/2023 2100 by Philomena Course, RN  Head of Bed [facility]) Positioning: [facility] elevated  Taken 04/27/2023 1900 by Philomena Course, RN  Head of Bed [facility]) Positioning: HOB elevated     Problem: Self-Care Deficit  Goal: Improved Ability to Complete Activities of Daily Living  Outcome: Progressing

## 2023-04-28 NOTE — Unmapped (Cosign Needed)
Surgery Progress Note  12 Days [facility] Service: SRA - GI 1 [address]) [phone]  Admitting Attending: Surgical Oncology Attendings: Daryl Eastern, DO      Assessment:     Sergio Perez is a 62 y.o. 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     Now s/p ex lap, small bowel resection, new ileocolic side-to-side anastomosis and omentectomy on 04/16/23. 14fr malecot G tube in place. Advanced diet without nausea, vomiting. Trending towards discharge once PO pain plan in place.     Interval Events:     NAEON. VSS, afebrile. UOP 4x unmeasured. G-tube with 350cc output. Continues to leave it clamped most the day. Tolerating diet, had a little bloating yesterday after eating some bread. Overall pain is better controlled and tolerable, but the PO Dilaudid still doesn't feel strong enough.     Plan:       Neuro/Pain:   *Hx of CVA   - Holding home Plavix   *Pain/Nausea   - PO scheduled Tylenol, lido patches    - PO Robaxin 1000mg  QID    - PRN PO dilaudid 2-4mg  for moderate pain   - Discontinue IV HM    - Home amitriptyline sch    - Atarax PRN   - Reglan sch    - Zofran and compazine prn   - Chronic pain re-engaged- appreciate recs - can add short-term course of ibuprofen if needed    CV: VSS and normotensive  *Hx of HTN, HLD  - Home metoprolol 100mg  BID and amlodipine   - Holding home fenofibrate, atorvastatin       Pulm: NWOB and on RA      GI  - F: ML  - E: replete PRN  - N: Surgical GI Diet, Ensures, Mighty Shakes   - Protonix, PRN tums  - Calorie Count    *malignant SBO s/p ex-lap, ileocolic anastomotic resection (9/13)  - Bilious GT output 9/22, non-bilious 9/23  - Vent G-tube prn for pain, nausea - leave GT clamped unless symptomatic   - ROBF  - Cellulitis on midline incision, Keflex 500mg  QID for 5 days (9/23- ), BID packing and dressing change to mid-line hematoma/wound       Endo:   *Hx of hypothyroidism  - Home levothyroxine      Renal/GU:   - Passed TOV  - Flomax 0.4mg  daily       Heme/ID:   - CBC every other day  - Lovenox DVT ppx  - WBC stable without leukocytosis, cellulitis improving on Keflex       Dispo: Floor    Please page SRA - GI 1 [address], [place]) [phone] with any questions.       Objective:      Vital Signs:  BP 124/78  - Pulse 94  - Temp 36.4 ??C (97.5 ??F) (Oral)  - Resp 18  - Ht 182.9 cm (6')  - Wt 91.9 kg (202 lb 9.6 oz)  - SpO2 96%  - BMI 27.48 kg/m??     Input/Output:  I/O         09/23 0701  09/24  0700 09/24 0701  09/25 0700 09/25 0701  09/26 0700    P.O. 658      IV Piggyback       TPN 2021.1      Total Intake 2679.1      Urine (mL/kg/hr) 0 (0) 0 (0)     Emesis/NG output 850 350 50    Stool 0 0     Total Output(mL/kg) 850 (9.2) 350 (3.8) 50 (0.5)    Net +1829.1 -350 -50           Urine Occurrence 3 x 4 x     Stool Occurrence 4 x 4 x     Emesis Occurrence  0 x             Physical Exam:    General: Cooperative, no distress, well appearing male  Neuro: Alert, oriented, appropriate  HEENT: Normocephalic, atraumatic  Pulmonary: Normal work of breathing, equal bilateral chest rise on RA  Cardiovascular: HDS, appears well perfused   Abdomen: distended, non-tender, G-tube with decreased output, improving erythema at mid-line, clean dressing/packing in place   Extremities: no peripheral edema    Labs:    Lab Results   Component Value Date    WBC 8.9 04/28/2023    HGB 11.1 (L) 04/28/2023    HCT 32.8 (L) 04/28/2023    PLT 473 (H) 04/28/2023       Lab Results   Component Value Date    NA 133 (L) 04/28/2023    K 4.8 04/28/2023    CL 104 04/28/2023    CO2 23.0 04/28/2023    BUN 22 04/28/2023    CREATININE 0.62 (L) 04/28/2023    CALCIUM 9.3 04/28/2023    MG 1.6 04/28/2023    PHOS 4.6 04/28/2023       Microbiology Results (last day)       ** No results found for the last 24 hours. **            Imaging:   None new in 24 hours.         Karna Christmas, MD   PGY-1 General Surgery

## 2023-04-28 NOTE — Unmapped (Addendum)
Pt A&O, VSS, complaints of pain managed with PRN Dilaudid. Diet tolerated, no n/v. Voiding adequate UOP. Up in chair. Independently ambulatory in hallways, no difficulties noted. Abdominal dressing changed, teaching provided to Pt; understanding verbalized. Will continue to monitor with safety parameters in place.     @1945  - Provider notified regarding new purulent drainage at top of abd midline incision. Dry gauze placed.     Problem: Adult Inpatient Plan of Care  Goal: Plan of Care Review  Outcome: Progressing  Goal: Patient-Specific Goal (Individualized)  Outcome: Progressing  Goal: Absence of Hospital-Acquired Illness or Injury  Outcome: Progressing  Goal: Optimal Comfort and Wellbeing  Outcome: Progressing  Goal: Readiness for Transition of Care  Outcome: Progressing  Goal: Rounds/Family Conference  Outcome: Progressing     Problem: Malnutrition  Goal: Improved Nutritional Intake  Outcome: Progressing     Problem: Wound  Goal: Optimal Coping  Outcome: Progressing  Goal: Optimal Functional Ability  Outcome: Progressing  Intervention: Optimize Functional Ability  Recent Flowsheet Documentation  Taken 04/28/2023 1359 by Janan Ridge, RN  Activity Management: up in stretcher chair  Goal: Absence of Infection Signs and Symptoms  Outcome: Progressing  Goal: Improved Oral Intake  Outcome: Progressing  Goal: Optimal Pain Control and Function  Outcome: Progressing  Goal: Skin Health and Integrity  Outcome: Progressing  Intervention: Optimize Skin Protection  Recent Flowsheet Documentation  Taken 04/28/2023 1359 by Janan Ridge, RN  Activity Management: up in stretcher chair  Goal: Optimal Wound Healing  Outcome: Progressing     Problem: Fall Injury Risk  Goal: Absence of Fall and Fall-Related Injury  Outcome: Progressing     Problem: Nausea and Vomiting  Goal: Nausea and Vomiting Relief  Outcome: Progressing     Problem: Nausea and Vomiting  Goal: Nausea and Vomiting Relief  Outcome: Progressing     Problem: Pain Acute  Goal: Optimal Pain Control and Function  Outcome: Progressing     Problem: Comorbidity Management  Goal: Blood Pressure in Desired Range  Outcome: Progressing     Problem: Surgery Nonspecified  Goal: Absence of Bleeding  Outcome: Progressing  Goal: Effective Bowel Elimination  Outcome: Progressing  Goal: Fluid and Electrolyte Balance  Outcome: Progressing  Goal: Blood Glucose Level Within Targeted Range  Outcome: Progressing  Goal: Absence of Infection Signs and Symptoms  Outcome: Progressing  Goal: Anesthesia/Sedation Recovery  Outcome: Progressing  Goal: Optimal Pain Control and Function  Outcome: Progressing  Goal: Nausea and Vomiting Relief  Outcome: Progressing  Goal: Effective Urinary Elimination  Outcome: Progressing  Goal: Effective Oxygenation and Ventilation  Outcome: Progressing     Problem: Self-Care Deficit  Goal: Improved Ability to Complete Activities of Daily Living  Outcome: Progressing

## 2023-04-29 MED ORDER — METOCLOPRAMIDE 10 MG TABLET
ORAL_TABLET | Freq: Three times a day (TID) | ORAL | 11 refills | 30.00000 days | Status: CP
Start: 2023-04-29 — End: 2024-04-23
  Filled 2023-05-01: qty 90, 30d supply, fill #0

## 2023-04-29 MED ORDER — HYDROMORPHONE 4 MG TABLET
ORAL_TABLET | ORAL | 0 refills | 4.00000 days | Status: CP | PRN
Start: 2023-04-29 — End: 2023-05-06
  Filled 2023-05-01: qty 20, 5d supply, fill #0

## 2023-04-29 MED ORDER — CEPHALEXIN 500 MG CAPSULE
ORAL_CAPSULE | Freq: Four times a day (QID) | ORAL | 0 refills | 4.00000 days | Status: CP
Start: 2023-04-29 — End: 2023-05-03

## 2023-04-29 MED ORDER — METHOCARBAMOL 500 MG TABLET
ORAL_TABLET | Freq: Four times a day (QID) | ORAL | 0 refills | 10.00000 days | Status: CP | PRN
Start: 2023-04-29 — End: 2023-05-29
  Filled 2023-05-01: qty 80, 20d supply, fill #0

## 2023-04-29 MED ADMIN — metoclopramide (REGLAN) injection 10 mg: 10 mg | INTRAVENOUS | @ 13:00:00 | Stop: 2023-04-29

## 2023-04-29 MED ADMIN — HYDROmorphone (DILAUDID) tablet 4 mg: 4 mg | ORAL | @ 15:00:00 | Stop: 2023-05-10

## 2023-04-29 MED ADMIN — cephalexin (KEFLEX) capsule 500 mg: 500 mg | ORAL | @ 15:00:00 | Stop: 2023-04-29

## 2023-04-29 MED ADMIN — amlodipine (NORVASC) tablet 10 mg: 10 mg | ORAL | @ 13:00:00

## 2023-04-29 MED ADMIN — sodium chloride (NS) 0.9 % flush 10 mL: 10 mL | INTRAVENOUS | @ 19:00:00

## 2023-04-29 MED ADMIN — methocarbamol (ROBAXIN) tablet 1,000 mg: 1000 mg | ORAL | @ 15:00:00

## 2023-04-29 MED ADMIN — sodium chloride (NS) 0.9 % flush 10 mL: 10 mL | INTRAVENOUS | @ 13:00:00

## 2023-04-29 MED ADMIN — atorvastatin (LIPITOR) tablet 40 mg: 40 mg | ORAL | @ 16:00:00

## 2023-04-29 MED ADMIN — calcium carbonate (TUMS) chewable tablet 200 mg elem calcium: 200 mg | ORAL | @ 15:00:00

## 2023-04-29 MED ADMIN — metoclopramide (REGLAN) tablet 10 mg: 10 mg | ORAL | @ 16:00:00

## 2023-04-29 MED ADMIN — tamsulosin (FLOMAX) 24 hr capsule 0.4 mg: .4 mg | ORAL | @ 13:00:00

## 2023-04-29 MED ADMIN — metoPROLOL tartrate (Lopressor) tablet 100 mg: 100 mg | ORAL | @ 13:00:00

## 2023-04-29 MED ADMIN — sodium chloride (NS) 0.9 % flush 10 mL: 10 mL | INTRAVENOUS | @ 03:00:00

## 2023-04-29 MED ADMIN — acetaminophen (TYLENOL) tablet 1,000 mg: 1000 mg | ORAL | @ 01:00:00

## 2023-04-29 MED ADMIN — HYDROmorphone (DILAUDID) tablet 4 mg: 4 mg | ORAL | @ 06:00:00 | Stop: 2023-05-10

## 2023-04-29 MED ADMIN — methocarbamol (ROBAXIN) tablet 1,000 mg: 1000 mg | ORAL | @ 22:00:00

## 2023-04-29 MED ADMIN — clopidogrel (PLAVIX) tablet 75 mg: 75 mg | ORAL | @ 16:00:00

## 2023-04-29 MED ADMIN — metoPROLOL tartrate (Lopressor) tablet 100 mg: 100 mg | ORAL | @ 01:00:00

## 2023-04-29 MED ADMIN — cephalexin (KEFLEX) capsule 500 mg: 500 mg | ORAL | @ 09:00:00 | Stop: 2023-04-29

## 2023-04-29 MED ADMIN — cephalexin (KEFLEX) capsule 500 mg: 500 mg | ORAL | @ 03:00:00 | Stop: 2023-05-01

## 2023-04-29 MED ADMIN — acetaminophen (TYLENOL) tablet 1,000 mg: 1000 mg | ORAL | @ 09:00:00

## 2023-04-29 MED ADMIN — acetaminophen (TYLENOL) tablet 1,000 mg: 1000 mg | ORAL | @ 19:00:00

## 2023-04-29 MED ADMIN — amitriptyline (ELAVIL) tablet 100 mg: 100 mg | ORAL | @ 01:00:00

## 2023-04-29 MED ADMIN — HYDROmorphone (DILAUDID) tablet 4 mg: 4 mg | ORAL | @ 19:00:00 | Stop: 2023-05-10

## 2023-04-29 MED ADMIN — methocarbamol (ROBAXIN) tablet 1,000 mg: 1000 mg | ORAL | @ 09:00:00

## 2023-04-29 MED ADMIN — cephalexin (KEFLEX) capsule 500 mg: 500 mg | ORAL | @ 22:00:00 | Stop: 2023-05-03

## 2023-04-29 MED ADMIN — metoclopramide (REGLAN) injection 10 mg: 10 mg | INTRAVENOUS | @ 01:00:00

## 2023-04-29 MED ADMIN — HYDROmorphone (DILAUDID) tablet 4 mg: 4 mg | ORAL | @ 10:00:00 | Stop: 2023-05-10

## 2023-04-29 MED ADMIN — HYDROmorphone (DILAUDID) tablet 4 mg: 4 mg | ORAL | @ 01:00:00 | Stop: 2023-05-10

## 2023-04-29 MED ADMIN — fenofibrate (TRICOR) tablet 145 mg: 145 mg | ORAL | @ 22:00:00

## 2023-04-29 MED ADMIN — ondansetron (ZOFRAN) injection 4 mg: 4 mg | INTRAVENOUS | @ 15:00:00

## 2023-04-29 MED ADMIN — levothyroxine (SYNTHROID) tablet 150 mcg: 150 ug | ORAL | @ 13:00:00

## 2023-04-29 MED ADMIN — enoxaparin (LOVENOX) syringe 40 mg: 40 mg | SUBCUTANEOUS | @ 03:00:00

## 2023-04-29 MED ADMIN — pantoprazole (Protonix) injection 40 mg: 40 mg | INTRAVENOUS | @ 12:00:00

## 2023-04-29 MED ADMIN — metoclopramide (REGLAN) tablet 10 mg: 10 mg | ORAL | @ 22:00:00

## 2023-04-29 MED ADMIN — methocarbamol (ROBAXIN) tablet 1,000 mg: 1000 mg | ORAL | @ 01:00:00

## 2023-04-29 NOTE — Unmapped (Signed)
Surgery Progress Note  13 Days [facility] Service: SRA - GI 1 [address]) [phone]  Admitting Attending: Surgical Oncology Attendings: Daryl Eastern, DO      Assessment:     Sergio Perez is a 62 y.o. 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     Now s/p ex lap, small bowel resection, new ileocolic side-to-side anastomosis and omentectomy on 04/16/23. 71fr malecot G tube in place. Advanced diet without nausea, vomiting. Trending towards discharge once PO pain plan in place.     Interval Events:     NAEON. VSS, afebrile. Pain well controlled. Kept G-tube clamped most the day and ate well. No nausea, vomiting. Ambulating independently. Likely discharge tomorrow.     Plan:       Neuro/Pain:   *Hx of CVA   - Restarted home Plavix   *Pain/Nausea   - PO scheduled Tylenol, lido patches    - PO Robaxin 1000mg  QID    - PRN PO dilaudid 2-4mg  for moderate pain   - Discontinue IV HM    - Home amitriptyline sch    - Atarax PRN   - Reglan sch    - Zofran and compazine prn   - Chronic pain re-engaged- appreciate recs - can add short-term course of ibuprofen if needed    CV: VSS and normotensive  *Hx of HTN, HLD  - Home metoprolol 100mg  BID and amlodipine   - Restarted home fenofibrate, atorvastatin       Pulm: NWOB and on RA      GI  - F: ML  - E: replete PRN  - N: Surgical GI Diet, Ensures, Mighty Shakes   - Protonix, PRN tums  - Calorie Count    *malignant SBO s/p ex-lap, ileocolic anastomotic resection (9/13)  - Bilious GT output 9/22, non-bilious 9/23  - Vent G-tube prn for pain, nausea - leave GT clamped unless symptomatic   - ROBF  - Cellulitis on midline incision, Keflex 500mg  QID for 7 days (9/23- ), BID packing and dressing change to mid-line hematoma/wound   - PO Reglan       Endo:   *Hx of hypothyroidism  - Home levothyroxine      Renal/GU:   - Passed TOV  - Flomax 0.4mg  daily       Heme/ID:   - CBC every other day  - Lovenox DVT ppx  - WBC stable without leukocytosis, cellulitis improving on Keflex       Dispo: Floor    Please page SRA - GI 1 [address], [place]) [phone] with any questions.     Attending Attestation:    I supervised the resident for the history and exam, and conducted pertinent aspects of the history and exam after the initial review. I discussed the findings, assessment and plan with the resident and agree with the findings and plan as documented in the resident???s note on the date listed above.      Objective:      Vital Signs:  BP 117/86  - Pulse 78  - Temp 36.5 ??C (97.7 ??F) (Oral)  - Resp 16  -  Ht 182.9 cm (6')  - Wt 91.9 kg (202 lb 9.6 oz)  - SpO2 97%  - BMI 27.48 kg/m??     Input/Output:  I/O         09/24 0701  09/25 0700 09/25 0701  09/26 0700 09/26 0701  09/27 0700    P.O.  1194 360    TPN       Total Intake  1194 360    Urine (mL/kg/hr) 0 (0) 0 (0) 0 (0)    Emesis/NG output 350 50 0    Stool 0 0 0    Total Output(mL/kg) 350 (3.8) 50 (0.5) 0 (0)    Net -350 +1144 +360           Urine Occurrence 4 x 5 x 4 x    Stool Occurrence 4 x 4 x 1 x    Emesis Occurrence 0 x 0 x 0 x            Physical Exam:    General: Cooperative, no distress, well appearing male  Neuro: Alert, oriented, appropriate  HEENT: Normocephalic, atraumatic  Pulmonary: Normal work of breathing, equal bilateral chest rise on RA  Cardiovascular: HDS, appears well perfused   Abdomen: improved distension, non-tender, G-tube with decreased output, improving erythema at mid-line, decreased drainage, clean dressing/packing in place   Extremities: no peripheral edema    Labs:    Lab Results   Component Value Date    WBC 8.9 04/28/2023    HGB 11.1 (L) 04/28/2023    HCT 32.8 (L) 04/28/2023    PLT 473 (H) 04/28/2023       Lab Results   Component Value Date    NA 133 (L) 04/28/2023    K 4.8 04/28/2023    CL 104 04/28/2023    CO2 23.0 04/28/2023    BUN 22 04/28/2023    CREATININE 0.62 (L) 04/28/2023    CALCIUM 9.3 04/28/2023    MG 1.6 04/28/2023    PHOS 4.6 04/28/2023       Microbiology Results (last day)       ** No results found for the last 24 hours. **            Imaging:   None new in 24 hours.         Karna Christmas, MD   PGY-1 General Surgery

## 2023-04-29 NOTE — Unmapped (Cosign Needed)
Discharge Summary    Admit date: 04/14/2023    Discharge date and time: 05/01/23    Discharge to:  Home    Discharge Service: [facility])    Discharge Attending Physician: Lynnell Dike, DO    Discharge  Diagnoses: Malignant SBO    Secondary Diagnosis: Active Problems:    * No active hospital problems. *  Resolved Problems:    * No resolved hospital problems. *      OR Procedures:    EXPLORATORY LAPAROTOMY, EXPLORATORY CELIOTOMY WITH OR WITHOUT BIOPSY(S)  ENTERECTOMY SM INTES; SNGL RESECT & ANASTOM  OMENTECTOMY, EPIPLOECTOMY, RESECTION OF OMENTUM  Date  04/16/2023  -------------------     Ancillary Procedures: no procedures    Discharge Day Services: The patient was seen and examined by the surgical team on the day of discharge. Vital signs and laboratory values were stable and appropriate for discharge. Surgical wounds were examined. Discharge plan was discussed with patient, instructions for home care given, and all questions answered. More than 30 minutes was spent in discharge planning services.      Subjective   No acute events overnight. Pain Controlled. No fever or chills. Passing BM and flatus. Keeping G-tube capped most the day. Tolerating regular diet without nausea, vomiting.    Objective   Patient Vitals for the past 8 hrs:   BP Temp Temp src Pulse Resp SpO2   05/01/23 0725 104/78 36.3 ??C (97.3 ??F) Oral 87 18 99 %     I/O this shift:  In: 240 [P.O.:240]  Out: 0     General Appearance:   No acute distress  Lungs:                Normal work of breathing  Heart:                           Regular rate and appears well perfused  Abdomen:                Soft, non-tender, non-distended, G-tube    capped, midline incision with improving erythema and      discharge, re-packed and dressed on rounds   Extremities:              Warm and well perfused    Hospital Course:  Sergio Perez is a 62 y.o. 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 The patient was taken to the OR on 04/16/2023 for a exploratory laparotomy, ileocolic anastomosis resection, new ileocolic side-to-side anastomosis, omentectomy with a 24 Fr maloncot G-tube placed. Intraoperatively, .  He tolerated the procedure well, was extubated in the OR, and was taken to the PACU where he received routine postoperative care before being transferred to the floor.     He did well postoperatively. His diet was slowly advanced, and at the time of discharge he was tolerating a regular diet and able to care for his G-tube without issue, venting as needed for any nausea. On POD 10 developed erythema and drainage from midline incision. Drained and packed with iodoform BID and given a 7 day course of Keflex. He was taught how to pack his wound on his own and demonstrated understanding. The patient was able to void spontaneously, have his pain controlled with P.O. pain medication, and ambulate with minimal assistance. He was having regular bowel movements.  He is being discharged on 05/01/23 to home in stable condition with planned outpatient follow-up.      Condition at Discharge: Improved  Discharge Medications:      Your Medication List        STOP taking these medications      loperamide 2 mg tablet  Commonly known as: IMODIUM A-D     oxyCODONE 5 MG immediate release tablet  Commonly known as: ROXICODONE            START taking these medications      cephalexin 500 MG capsule  Commonly known as: KEFLEX  Take 1 capsule (500 mg total) by mouth every six (6) hours for 3 days.     HYDROmorphone 4 MG tablet  Commonly known as: DILAUDID  Take 1 tablet (4 mg total) by mouth every four (4) hours as needed for severe pain.     methocarbamol 500 MG tablet  Commonly known as: ROBAXIN  Take 2 tablets (1,000 mg total) by mouth four (4) times a day as needed.     metoclopramide 10 MG tablet  Commonly known as: REGLAN  Take 1 tablet (10 mg total) by mouth Three (3) times a day with a meal.     tamsulosin 0.4 mg capsule  Commonly known as: FLOMAX  Take 1 capsule (0.4 mg total) by mouth daily.            CONTINUE taking these medications      acetaminophen 500 MG tablet  Commonly known as: TYLENOL  Take 2 tablets (1,000 mg total) by mouth every twelve (12) hours as needed for pain.     amitriptyline 100 MG tablet  Commonly known as: ELAVIL  Take 1 tablet (100 mg total) by mouth nightly. Frequency:QHSPRN   Dosage:50   MG  Instructions:  Note:Dose: 50MG      amlodipine 10 MG tablet  Commonly known as: NORVASC  Take 1 tablet (10 mg total) by mouth daily.     atorvastatin 40 MG tablet  Commonly known as: LIPITOR  Take 1 tablet (40 mg total) by mouth daily.     clopidogrel 75 mg tablet  Commonly known as: PLAVIX  clopidogrel 75 mg tablet   TAKE 1 TABLET BY MOUTH ONCE DAILY     diphenoxylate-atropine 2.5-0.025 mg per tablet  Commonly known as: LOMOTIL  Take 1 tablet by mouth 4 times daily     doxycycline 100 MG capsule  Commonly known as: MONODOX  Take 1 capsule (100 mg total) by mouth two (2) times a day.     fenofibrate 160 MG tablet  Commonly known as: LOFIBRA  Take 1 tablet (160 mg total) by mouth daily.     fluoride (sodium) 1.1 % Gel  Commonly known as: DentaGel  Apply 1 application topically two (2) times a day.     gabapentin 400 MG capsule  Commonly known as: NEURONTIN  1 in the AM; 1 at lunch and 3 at bedtime     levothyroxine 150 MCG tablet  Commonly known as: SYNTHROID  Take 1 tablet (150 mcg total) by mouth daily.     LONSURF 15-6.14 mg tablet  Generic drug: trifluridine-tipiracil  Take 5 tablets (75 mg total) by mouth in the morning and 5 tablets (75 mg total) in the evening. Take with meals. Take for 5 days on, 9 days off for every 14 day cycle.     magnesium oxide 400 mg (241.3 mg elemental) tablet  Commonly known as: MAG-OX  Take 1 tablet (400 mg total) by mouth two (2) times a day.     metoPROLOL tartrate 100 MG tablet  Commonly known as: Lopressor  1 tablet (100 mg total) two (2) times a day.     MITIGARE 0.6 mg Cap capsule  Generic drug: colchicine     ondansetron 8 MG tablet  Commonly known as: ZOFRAN  Take 1 tablet (8 mg total) by mouth every eight (8) hours as needed for nausea. Do not take on day 1 of chemotherapy cycles.     pantoprazole 40 MG tablet  Commonly known as: Protonix  Take 1 tablet (40 mg total) by mouth daily.     prochlorperazine 10 MG tablet  Commonly known as: COMPAZINE  Take 1 tablet (10 mg total) by mouth every six (6) hours as needed (Nausea & Vomiting).               Pending Test Results: None     Discharge Instructions:    Other Instructions:  Other Instructions       Discharge instructions      Surgical Oncology DC Instructions: Surgical Oncology GI Surgery Discharge       DIET: You may resume a regular diet as tolerated. Nutritional supplements such as Boost, Ensure or [employer] are encouraged (unless you are on a clear liquid diet, or otherwise instructed to avoid nutritional supplements).    INCISION CARE: Look at your incisions at least twice a day. A small amount of drainage is normal. If your doctor put surgical glue on your incisions, it will fall off gradually by itself, usually about 7-10 days after surgery. Do not pull it off yourself. You can put gauze over the incisions for the first week to protect clothing from any drainage. Avoid wearing tight clothing.    - Please continue to pack you mid-line incision with clean iodoform packing strips twice daily. Try to pack as much inside the wound as you can. Cover the packing and wound with dry gauze and skin safe tape. Change this dressing twice a day or whenever soaked through. If you notice more drainage or redness please call the clinic.     G-TUBE CARE:     A feeding tube, called a G tube, was placed during your most recent hospitalization.     This is a single lumen tube, ending in your stomach. This will allow [place] to vent, or empty, your stomach to relieve nausea, vomiting, abdominal pressure through the G-tube.     Care of this tube is relatively simple. You may shower, and cleanse around the insertion site with soap and warm water daily. Pat dry. Crusty appearing drainage around the tube or sutures is normal. If this is not removed with soap and water, you may cleanse site with half-strength hydrogen peroxide (dilute hydrogen peroxide with tap water to make a 50/50 solution). You should flush your tube before AND after every single use with tap water to maintain tube patency. If you are no longer using your tube, you should flush it at least twice daily with tap water to maintain patency. If your tube is difficult or sluggish to flush, flush with hot water (at a temperature that you are still able to touch) or Coca[employer]Cola. If this does not help unclog your tube, please call our office.      BATHING: You may shower, but do not submerge wounds (eg bathtub, swimming pool) until cleared to do so at your follow-up appointment.  ACTIVITY: No lifting greater than 10 pounds or strenuous activity until seen by the doctor in your follow-up visit.    PAIN MEDICATION:   1. You SHOULD take 1000 mg Tylenol every 8 hours as needed in addition to, or instead of, narcotic pain medication. This will help you decrease your need for narcotics. Do NOT exceed 3,000 mg Tylenol in 24 hours.   2. You SHOULD take Advil/Motrin/ibupfrofen (400-600 mg every 6 hours as needed) products in addition to, or instead of, narcotic pain medication. This will help you decrease your need for narcotics. Take with food.   3. You may purchase over the counter lidocaine patches (2 or 4%) to help with localized incisional pain. Use these according to package directions.  4. Do not drive or drink alcohol while taking pain medication.   5. Take your prescribed narcotic pain medication only as needed.   6. You should decrease the amount of narcotic pain medication as soon as possible.  7. Narcotic pain medication can cause constipation. To avoid this, you should take an over the counter stool softener (Colace). Some patients will also require a laxative, which you may also find available over the counter (MiraLax, Senna, Dulcolax, magesium citrate).    Please note that prescription pain medication refills will not be called into your pharmacy. If you feel that you are needing more pain medication, you will need to be seen in clinic or the Emergency Department for further evaluation to ensure you are not experiencing a complication. Please take note of how many pills you have left in your bottle, and if you are starting to run low and feel that you will need more for adequate pain control, call the clinic as soon as possible to schedule an appointment. As our clinics are very busy, and we cannot guarantee same week appointments.      WHEN TO CALL THE SURGEON'S OFFICE:  1. Thick, yellow drainage from your incisions, redness at incisions, bleeding, or separations of wounds.   2. Temperature greater than 101.5  3. Uncontrolled nausea or vomiting.  4. Pain that is not controlled by your pain medication.  5. Inability to have a bowel movement for 3 or more consecutive days.  6. Jaundice (yellow eyes or skin).  7. Any new or concerning symptoms.        If you have problems or questions after discharge, please contact the following:     During business hours (Monday-Friday 8am-5pm): Contact the [facility] Triage Line at [phone]. These calls will be triaged through a navigation system, and returned as soon as possible. Calls late in the business day may not be returned until the following business day.     Alternatively, you may leave a message with your surgeon's nurse practitioner. For Dr. Hiram Comber, Dr. Donalynn Furlong, and Dr. Milderd Meager you may reach Lonia Farber, NP at [phone]; for Dr. Daryl Eastern, Dr. Tora Duck, or Dr. Margarette Asal you may reach Sedalia Muta, NP at [phone]; or, Boykin Nearing, NP at [email or web address] Please be aware that messages will be returned as soon as possible, but these voicemails/emails are only monitored Monday - Friday 8am -5pm.     For emergencies after-hours (after 5pm or weekends): call the [facility] operator [phone] and ask to page the General Surgery resident on call. You will be directed to a surgery resident who likely is not immediately aware of the details of your case, but can help you deal with any  emergencies that cannot wait until regular business hours.  Please be aware that this person is responding to many in-hospital emergencies and patient issues and may not answer your phone call immediately, but will return your call as soon as possible. This individual may not provide you with medication refills.     Questions regarding clinic appointments: [phone]        [facility] Patient Resource Center    The Patient & Family Resource Center is located in the lobby of the [facility] next to the Outpatient Pharmacy.  We offer a variety of support services to help as you and your caregivers continue to navigate your cancer treatment journey. We can connect you with social work, support groups, financial assistance, counseling, relaxation spaces, hair-loss boutique services, and much more.  You are welcome to call us at [phone] or stop in Monday-Friday 7:30am-4pm.          Labs and Other Follow-ups after Discharge:  Follow Up instructions and Outpatient Referrals     Discharge instructions          Future Appointments:  Appointments which have been scheduled for you      May 06, 2023 11:00 AM  (Arrive by 10:45 AM)  RETURN ACTIVE [place] with Roni Bread, MD  [facility]) [address]  [address] Floor  [place] [address]  [phone]

## 2023-04-29 NOTE — Unmapped (Signed)
Patient alert and oriented x4. VSS. Surgical sites with dry guaze to upper and lower incision. MD aware of new guaze to upper part of incision. No new orders given. LUQ g-tube clamped overnight. L double lumen PICC line patent, saline locked. Pt tolerating oral diet. Pt c/o intermittent abd pain, prns given as needed overnight. Bed in lowest position and call bell within reach.    Problem: Adult Inpatient Plan of Care  Goal: Plan of Care Review  Outcome: Progressing  Goal: Patient-Specific Goal (Individualized)  Outcome: Progressing  Goal: Absence of Hospital-Acquired Illness or Injury  Outcome: Progressing  Goal: Readiness for Transition of Care  Outcome: Progressing

## 2023-04-29 NOTE — Unmapped (Signed)
Hi,     Sergio Perez contacted the Communication Center requesting to speak with the care team of Sergio Perez to discuss:    Patient is being discharged today and needs some clarification on meds    Please contact [place]  at [phone].    Thank you,   Yehuda Mao  [facility] Communication Center   [phone]

## 2023-04-29 NOTE — Unmapped (Signed)
Returned call to [place] with Inpatient Surgical Oncology.States she is sending him home today with a 7 day supply of Dilaudid 4 mg.She was calling to see if Dr. Lawernce Ion team would manage pain after that?    Let her know pt has an appt to see Dr. Forbes Cellar on 05-06-23 ( next Thursday) so they can discuss his pain management needs at that appt.She is agreeable to this plan and will call back with any further needs.

## 2023-04-29 NOTE — Unmapped (Signed)
Adult Nutrition Progress Note    Visit Type: Follow-Up  Reason for Visit: Parenteral Nutrition    NUTRITION ASSESSMENT     Current nutrition therapy is appropriate and progressing toward meeting meeting nutritional needs at this time.   Patient would benefit from start of oral supplement to better meet nutritional needs.    NUTRITION INTERVENTIONS and RECOMMENDATION     Continue Surgical GI diet as tolerated  Continue to encourage good PO intakes with small, frequent meals and snacks and preferred protein drinks between meals.   Discussed nutrition recommendations for discharge with patient today     Nutrition Progress: Patient on TPN from 9/12 to 9/24. Diet advanced to Surgical GI 9/23, per kcal count he consumed 1213 kcal (58% of estimated needs) and 47 g protein (39% of estimated needs) yesterday.  He does not like ensures but has been supplementing with mighty shakes     Nutrition History:   April 15, 2023:   Prior to admission: Patient reports poor/minimal PO intakes starting 9/9 related to nausea/vomiting. He reports prior to this appetite has been low over the past year.     Anthropometric Data:  Height: 182.9 cm (6') 182.9 cm (6')  as of 10/29/2022   Admission weight: 91.2 kg (201 lb)  Last recorded weight: 91.9 kg (202 lb 9.6 oz)  IBW: 80.79 kg  Percent IBW: 109.48 %  BMI: Body mass index is 27.48 kg/m??.   Usual Body Weight:  217 lb    Weight history prior to admission:  Patient reports weight loss as low as 170 lb over the past year PTA. He feels last recorded weight of 215 lb is not accurate (? Fluid related/abd distention vs scale discrepancy)     Wt Readings from Last 10 Encounters:   04/24/23 91.9 kg (202 lb 9.6 oz)   03/18/23 91.5 kg (201 lb 12.8 oz)   03/04/23 90.2 kg (198 lb 14.4 oz)   02/18/23 90.2 kg (198 lb 15.4 oz)   02/05/23 90.8 kg (200 lb 3.2 oz)   01/21/23 89.7 kg (197 lb 10.3 oz)   01/07/23 90.6 kg (199 lb 13.6 oz)   12/24/22 89.7 kg (197 lb 12.8 oz)   12/10/22 88.6 kg (195 lb 4.8 oz) 11/26/22 87.3 kg (192 lb 7.4 oz)        Weight changes this admission:   Last 5 Recorded Weights    04/17/23 0545 04/18/23 0600 04/20/23 0557 04/20/23 1000   Weight: 88.8 kg (195 lb 12.3 oz) 88.7 kg (195 lb 8.8 oz) 91.7 kg (202 lb 2.6 oz) 88.5 kg (195 lb)    04/24/23 0557   Weight: 91.9 kg (202 lb 9.6 oz)        Malnutrition Assessment using AND/ASPEN or GLIM Clinical Characteristics:  GLIM Moderate Malnutrition (04/15/23 1729)  Reduced Muscle Mass: Mild to moderate deficit  Reduced Food Intake: Any chronic gastrointestinal condition that adversely impacts food assimilation or absorption  Inflammation: Chronic disease related    Nutrition Focused Physical Exam:  Fat Areas Examined  Orbital: Moderate loss  Upper Arm: Severe loss  Thoracic: Mild loss      Muscle Areas Examined  Temple: Mild loss  Clavicle: Moderate loss  Acromion: Moderate loss  Scapular: Moderate loss  Dorsal Hand: Moderate loss  Patellar: Mild loss  Anterior Thigh: Mild loss  Posterior Calf: No loss    Nutrition Evaluation  Overall Impressions: Moderate fat loss, Mild muscle loss (04/15/23 1728)    Care plan:Yes  NUTRITIONALLY RELEVANT DATA     Medications:   Nutritionally pertinent medications reviewed and evaluated for potential food and/or medication interactions and include pantoprazole       Labs:   Lab Results   Component Value Date    NA 133 (L) 04/28/2023    K 4.8 04/28/2023    CL 104 04/28/2023    CO2 23.0 04/28/2023    BUN 22 04/28/2023    CREATININE 0.62 (L) 04/28/2023    GLU 95 04/28/2023    CALCIUM 9.3 04/28/2023    ALBUMIN 2.5 (L) 04/23/2023    PHOS 4.6 04/28/2023      Recent Labs     Units 04/28/23  0333   MG mg/dL 1.6       Lab Results   Component Value Date    ALKPHOS 113 04/23/2023    BILITOT 0.2 (L) 04/23/2023    BILIDIR <0.10 04/23/2023    PROT 6.7 04/23/2023    ALBUMIN 2.5 (L) 04/23/2023    ALT 12 04/23/2023    AST 14 04/23/2023      Recent Labs     Units 04/23/23  0503   TRIG mg/dL 161*        Current Nutrition:  Oral intake   Parenteral nutrition via PICC   Placement Date: 04/15/23  Placed by VAT team     Nutrition Orders            Supplement Adult; MightyShakes (High Fat Milkshake); # of Products PER Serving: 1 2xd PC starting at 09/25 1800    Supplement Adult; Ensure High Protein (Standard High Protein); # of Products PER Serving: 1 2xd PC starting at 09/24 1800    Nutrition Therapy Surgical GI starting at 09/23 0831          Nutritional Needs:   Daily Estimated Nutrient Needs:  Energy: 1980-2250 kcals 22-25 kcal/kg (x 1-1.2 AF) using estimated dry weight, 90 kg (04/15/23 1438)]  Protein: 108-135 gm [1.2-1.5 gm/kg using estimated dry weight, 90 kg (04/15/23 1438)]  Carbohydrate:   [45-60% of kcal]  Fluid:   mL [1 mL/kcal (maintenance)]    Allergies, Intolerances, Sensitivities, and/or Cultural/Religious Dietary Restrictions: none identified at this time     GOALS and EVALUATION     Patient to meet 60% or greater of nutritional needs via parenteral nutrition within admit.  - Meeting/Ongoing    Motivation, Barriers, and Compliance:  Evaluation of motivation, barriers, and compliance completed. No concerns identified at this time.     Discharge Planning:   Monitor for potential discharge needs with multi-disciplinary team.     Follow-Up Parameters:   Monitor daily while on TPN    [facility] MS, RD, LD, CNSC  [phone]

## 2023-04-30 LAB — BASIC METABOLIC PANEL
ANION GAP: 6 mmol/L (ref 5–14)
BLOOD UREA NITROGEN: 18 mg/dL (ref 9–23)
BUN / CREAT RATIO: 27
CALCIUM: 9.2 mg/dL (ref 8.7–10.4)
CHLORIDE: 103 mmol/L (ref 98–107)
CO2: 24 mmol/L (ref 20.0–31.0)
CREATININE: 0.66 mg/dL — ABNORMAL LOW
EGFR CKD-EPI (2021) MALE: >90 mL/min/{1.73_m2} (ref >=60)
GLUCOSE RANDOM: 95 mg/dL (ref 70–179)
POTASSIUM: 4.2 mmol/L (ref 3.4–4.8)
SODIUM: 133 mmol/L — ABNORMAL LOW (ref 135–145)

## 2023-04-30 MED ORDER — CEPHALEXIN 500 MG CAPSULE
ORAL_CAPSULE | Freq: Four times a day (QID) | ORAL | 0 refills | 3.00000 days
Start: 2023-04-30 — End: 2023-05-03

## 2023-04-30 MED ORDER — TAMSULOSIN 0.4 MG CAPSULE
ORAL_CAPSULE | Freq: Every day | ORAL | 3 refills | 90.00000 days | Status: CP
Start: 2023-04-30 — End: 2024-04-29
  Filled 2023-05-01: qty 90, 90d supply, fill #0

## 2023-04-30 MED ADMIN — sodium chloride (NS) 0.9 % flush 10 mL: 10 mL | INTRAVENOUS | @ 13:00:00

## 2023-04-30 MED ADMIN — metoclopramide (REGLAN) tablet 10 mg: 10 mg | ORAL | @ 21:00:00

## 2023-04-30 MED ADMIN — cephalexin (KEFLEX) capsule 500 mg: 500 mg | ORAL | @ 15:00:00 | Stop: 2023-05-03

## 2023-04-30 MED ADMIN — HYDROmorphone (DILAUDID) tablet 4 mg: 4 mg | ORAL | @ 07:00:00 | Stop: 2023-05-10

## 2023-04-30 MED ADMIN — HYDROmorphone (DILAUDID) tablet 4 mg: 4 mg | ORAL | @ 15:00:00 | Stop: 2023-05-10

## 2023-04-30 MED ADMIN — methocarbamol (ROBAXIN) tablet 1,000 mg: 1000 mg | ORAL | @ 15:00:00

## 2023-04-30 MED ADMIN — amlodipine (NORVASC) tablet 10 mg: 10 mg | ORAL | @ 13:00:00

## 2023-04-30 MED ADMIN — sodium chloride (NS) 0.9 % flush 10 mL: 10 mL | INTRAVENOUS | @ 21:00:00

## 2023-04-30 MED ADMIN — levothyroxine (SYNTHROID) tablet 150 mcg: 150 ug | ORAL | @ 13:00:00

## 2023-04-30 MED ADMIN — acetaminophen (TYLENOL) tablet 1,000 mg: 1000 mg | ORAL | @ 02:00:00

## 2023-04-30 MED ADMIN — methocarbamol (ROBAXIN) tablet 1,000 mg: 1000 mg | ORAL | @ 21:00:00

## 2023-04-30 MED ADMIN — methocarbamol (ROBAXIN) tablet 1,000 mg: 1000 mg | ORAL | @ 02:00:00

## 2023-04-30 MED ADMIN — cephalexin (KEFLEX) capsule 500 mg: 500 mg | ORAL | @ 10:00:00 | Stop: 2023-05-03

## 2023-04-30 MED ADMIN — HYDROmorphone (DILAUDID) tablet 4 mg: 4 mg | ORAL | @ 03:00:00 | Stop: 2023-05-10

## 2023-04-30 MED ADMIN — cephalexin (KEFLEX) capsule 500 mg: 500 mg | ORAL | @ 03:00:00 | Stop: 2023-05-03

## 2023-04-30 MED ADMIN — metoPROLOL tartrate (Lopressor) tablet 100 mg: 100 mg | ORAL | @ 02:00:00

## 2023-04-30 MED ADMIN — acetaminophen (TYLENOL) tablet 1,000 mg: 1000 mg | ORAL | @ 10:00:00

## 2023-04-30 MED ADMIN — pantoprazole (Protonix) injection 40 mg: 40 mg | INTRAVENOUS | @ 13:00:00

## 2023-04-30 MED ADMIN — methocarbamol (ROBAXIN) tablet 1,000 mg: 1000 mg | ORAL | @ 10:00:00

## 2023-04-30 MED ADMIN — metoclopramide (REGLAN) tablet 10 mg: 10 mg | ORAL | @ 10:00:00

## 2023-04-30 MED ADMIN — acetaminophen (TYLENOL) tablet 1,000 mg: 1000 mg | ORAL | @ 18:00:00

## 2023-04-30 MED ADMIN — metoPROLOL tartrate (Lopressor) tablet 100 mg: 100 mg | ORAL | @ 13:00:00

## 2023-04-30 MED ADMIN — fenofibrate (TRICOR) tablet 145 mg: 145 mg | ORAL | @ 13:00:00

## 2023-04-30 MED ADMIN — HYDROmorphone (DILAUDID) tablet 4 mg: 4 mg | ORAL | @ 11:00:00 | Stop: 2023-05-10

## 2023-04-30 MED ADMIN — amitriptyline (ELAVIL) tablet 100 mg: 100 mg | ORAL | @ 02:00:00

## 2023-04-30 MED ADMIN — senna (SENOKOT) tablet 1 tablet: 1 | ORAL | @ 02:00:00

## 2023-04-30 MED ADMIN — sodium chloride (NS) 0.9 % flush 10 mL: 10 mL | INTRAVENOUS | @ 04:00:00

## 2023-04-30 MED ADMIN — clopidogrel (PLAVIX) tablet 75 mg: 75 mg | ORAL | @ 13:00:00

## 2023-04-30 MED ADMIN — tamsulosin (FLOMAX) 24 hr capsule 0.4 mg: .4 mg | ORAL | @ 13:00:00

## 2023-04-30 MED ADMIN — cephalexin (KEFLEX) capsule 500 mg: 500 mg | ORAL | @ 21:00:00 | Stop: 2023-05-03

## 2023-04-30 MED ADMIN — HYDROmorphone (DILAUDID) tablet 4 mg: 4 mg | ORAL | @ 18:00:00 | Stop: 2023-05-10

## 2023-04-30 MED ADMIN — metoclopramide (REGLAN) tablet 10 mg: 10 mg | ORAL | @ 15:00:00

## 2023-04-30 MED ADMIN — atorvastatin (LIPITOR) tablet 40 mg: 40 mg | ORAL | @ 13:00:00

## 2023-04-30 MED ADMIN — HYDROmorphone (DILAUDID) tablet 4 mg: 4 mg | ORAL | @ 22:00:00 | Stop: 2023-05-10

## 2023-04-30 MED ADMIN — enoxaparin (LOVENOX) syringe 40 mg: 40 mg | SUBCUTANEOUS | @ 03:00:00

## 2023-04-30 NOTE — Unmapped (Signed)
Pt alert and oriented x4. Pain managed with prn dilaudid. VSS on RA. Pt ambulates independently. Dressing remain clean and intact. Call light and bed side table within reach. Will continue to monitor.    Problem: Adult Inpatient Plan of Care  Goal: Plan of Care Review  Outcome: Progressing  Goal: Patient-Specific Goal (Individualized)  Outcome: Progressing  Goal: Absence of Hospital-Acquired Illness or Injury  Outcome: Progressing  Intervention: Identify and Manage Fall Risk  Recent Flowsheet Documentation  Taken 04/29/2023 2030 by Sherie Don I, RN  Safety Interventions:   fall reduction program maintained   low bed  Intervention: Prevent Skin Injury  Recent Flowsheet Documentation  Taken 04/29/2023 2345 by Sherie Don I, RN  Device Skin Pressure Protection: absorbent pad utilized/changed  Skin Protection: incontinence pads utilized  Taken 04/29/2023 2030 by Sherie Don I, RN  Device Skin Pressure Protection: absorbent pad utilized/changed  Skin Protection: incontinence pads utilized  Intervention: Prevent and Manage VTE (Venous Thromboembolism) Risk  Recent Flowsheet Documentation  Taken 04/29/2023 2345 by Sherie Don I, RN  VTE Prevention/Management: ambulation promoted  Taken 04/29/2023 2030 by Sherie Don I, RN  Anti-Embolism Intervention: Refused  Goal: Optimal Comfort and Wellbeing  Outcome: Progressing  Goal: Readiness for Transition of Care  Outcome: Progressing  Goal: Rounds/Family Conference  Outcome: Progressing     Problem: Malnutrition  Goal: Improved Nutritional Intake  Outcome: Progressing     Problem: Wound  Goal: Optimal Coping  Outcome: Progressing  Goal: Optimal Functional Ability  Outcome: Progressing  Goal: Absence of Infection Signs and Symptoms  Outcome: Progressing  Goal: Improved Oral Intake  Outcome: Progressing  Goal: Optimal Pain Control and Function  Outcome: Progressing  Goal: Skin Health and Integrity  Outcome: Progressing  Intervention: Optimize Skin Protection  Recent Flowsheet Documentation  Taken 04/29/2023 2345 by Sherie Don I, RN  Pressure Reduction Techniques: frequent weight shift encouraged  Pressure Reduction Devices: pressure-redistributing mattress utilized  Skin Protection: incontinence pads utilized  Taken 04/29/2023 2030 by Sherie Don I, RN  Pressure Reduction Techniques: frequent weight shift encouraged  Head of Bed (HOB) Positioning: HOB elevated  Pressure Reduction Devices: pressure-redistributing mattress utilized  Skin Protection: incontinence pads utilized  Goal: Optimal Wound Healing  Outcome: Progressing     Problem: Fall Injury Risk  Goal: Absence of Fall and Fall-Related Injury  Outcome: Progressing  Intervention: Promote Scientist, clinical (histocompatibility and immunogenetics) Documentation  Taken 04/29/2023 2030 by Sherie Don I, RN  Safety Interventions:   fall reduction program maintained   low bed     Problem: Nausea and Vomiting  Goal: Nausea and Vomiting Relief  Outcome: Progressing     Problem: Nausea and Vomiting  Goal: Nausea and Vomiting Relief  Outcome: Progressing     Problem: Pain Acute  Goal: Optimal Pain Control and Function  Outcome: Progressing     Problem: Comorbidity Management  Goal: Blood Pressure in Desired Range  Outcome: Progressing     Problem: Surgery Nonspecified  Goal: Absence of Bleeding  Outcome: Progressing  Goal: Effective Bowel Elimination  Outcome: Progressing  Goal: Fluid and Electrolyte Balance  Outcome: Progressing  Goal: Blood Glucose Level Within Targeted Range  Outcome: Progressing  Goal: Absence of Infection Signs and Symptoms  Outcome: Progressing  Goal: Anesthesia/Sedation Recovery  Outcome: Progressing  Intervention: Optimize Anesthesia Recovery  Recent Flowsheet Documentation  Taken 04/29/2023 2030 by Sherie Don I, RN  Safety Interventions:   fall reduction program maintained   low bed  Goal: Optimal Pain Control and Function  Outcome: Progressing  Goal: Nausea and Vomiting Relief  Outcome: Progressing  Goal: Effective Urinary Elimination  Outcome: Progressing  Goal: Effective Oxygenation and Ventilation  Outcome: Progressing  Intervention: Optimize Oxygenation and Ventilation  Recent Flowsheet Documentation  Taken 04/29/2023 2030 by Sherie Don I, RN  Head of Bed [facility]) Positioning: [facility] elevated     Problem: Self-Care Deficit  Goal: Improved Ability to Complete Activities of Daily Living  Outcome: Progressing
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
## 2023-04-30 NOTE — Unmapped (Signed)
Problem: Adult Inpatient Plan of Care  Goal: Plan of Care Review  Outcome: Progressing  Goal: Patient-Specific Goal (Individualized)  Outcome: Progressing  Goal: Absence of Hospital-Acquired Illness or Injury  Outcome: Progressing  Intervention: Identify and Manage Fall Risk  Recent Flowsheet Documentation  Taken 04/30/2023 0850 by Gerrianne Scale, RN  Safety Interventions:   fall reduction program maintained   low bed  Intervention: Prevent Skin Injury  Recent Flowsheet Documentation  Taken 04/30/2023 0850 by Gerrianne Scale, RN  Positioning for Skin: Supine/Back  Intervention: Prevent and Manage VTE (Venous Thromboembolism) Risk  Recent Flowsheet Documentation  Taken 04/30/2023 0850 by Gerrianne Scale, RN  VTE Prevention/Management:   ambulation promoted   anticoagulant therapy  Goal: Optimal Comfort and Wellbeing  Outcome: Progressing  Goal: Readiness for Transition of Care  Outcome: Progressing  Goal: Rounds/Family Conference  Outcome: Progressing     Problem: Malnutrition  Goal: Improved Nutritional Intake  Outcome: Progressing     Problem: Wound  Goal: Optimal Coping  Outcome: Progressing  Goal: Optimal Functional Ability  Outcome: Progressing  Goal: Absence of Infection Signs and Symptoms  Outcome: Progressing  Goal: Improved Oral Intake  Outcome: Progressing  Goal: Optimal Pain Control and Function  Outcome: Progressing  Goal: Skin Health and Integrity  Outcome: Progressing  Intervention: Optimize Skin Protection  Recent Flowsheet Documentation  Taken 04/30/2023 0850 by Gerrianne Scale, RN  Pressure Reduction Techniques: frequent weight shift encouraged  Pressure Reduction Devices: pressure-redistributing mattress utilized  Goal: Optimal Wound Healing  Outcome: Progressing     Problem: Fall Injury Risk  Goal: Absence of Fall and Fall-Related Injury  Outcome: Progressing  Intervention: Promote Scientist, clinical (histocompatibility and immunogenetics) Documentation  Taken 04/30/2023 0850 by Gerrianne Scale, RN  Safety Interventions: fall reduction program maintained   low bed     Problem: Nausea and Vomiting  Goal: Nausea and Vomiting Relief  Outcome: Progressing     Problem: Nausea and Vomiting  Goal: Nausea and Vomiting Relief  Outcome: Progressing     Problem: Pain Acute  Goal: Optimal Pain Control and Function  Outcome: Progressing     Problem: Comorbidity Management  Goal: Blood Pressure in Desired Range  Outcome: Progressing     Problem: Surgery Nonspecified  Goal: Absence of Bleeding  Outcome: Progressing  Goal: Effective Bowel Elimination  Outcome: Progressing  Goal: Fluid and Electrolyte Balance  Outcome: Progressing  Goal: Blood Glucose Level Within Targeted Range  Outcome: Progressing  Goal: Absence of Infection Signs and Symptoms  Outcome: Progressing  Goal: Anesthesia/Sedation Recovery  Outcome: Progressing  Intervention: Optimize Anesthesia Recovery  Recent Flowsheet Documentation  Taken 04/30/2023 0850 by Gerrianne Scale, RN  Safety Interventions:   fall reduction program maintained   low bed  Goal: Optimal Pain Control and Function  Outcome: Progressing  Goal: Nausea and Vomiting Relief  Outcome: Progressing  Goal: Effective Urinary Elimination  Outcome: Progressing  Goal: Effective Oxygenation and Ventilation  Outcome: Progressing     Problem: Self-Care Deficit  Goal: Improved Ability to Complete Activities of Daily Living  Outcome: Progressing
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
## 2023-04-30 NOTE — Unmapped (Signed)
Pt A&O, VSS, complaints of pain managed with PRN Dilaudid. Given Zofran x1 for c/o nausea, given Tums x1 for c/o indigestion; relief noted per Pt. Abd surgical site with scant drainage at top of incision; dry gauze placed. Packing/dressing changed at lower part of incision; remains CDI. Pt independently ambulatory, no difficulties noted. Discharge orders were discontinued today, discharge meds sent back to pharmacy with Pharmacy Tech.      Problem: Adult Inpatient Plan of Care  Goal: Plan of Care Review  Outcome: Progressing  Goal: Patient-Specific Goal (Individualized)  Outcome: Progressing  Goal: Absence of Hospital-Acquired Illness or Injury  Outcome: Progressing  Goal: Optimal Comfort and Wellbeing  Outcome: Progressing  Goal: Readiness for Transition of Care  Outcome: Progressing  Goal: Rounds/Family Conference  Outcome: Progressing     Problem: Malnutrition  Goal: Improved Nutritional Intake  Outcome: Progressing     Problem: Wound  Goal: Optimal Coping  Outcome: Progressing  Goal: Optimal Functional Ability  Outcome: Progressing  Goal: Absence of Infection Signs and Symptoms  Outcome: Progressing  Goal: Improved Oral Intake  Outcome: Progressing  Goal: Optimal Pain Control and Function  Outcome: Progressing  Goal: Skin Health and Integrity  Outcome: Progressing  Goal: Optimal Wound Healing  Outcome: Progressing     Problem: Fall Injury Risk  Goal: Absence of Fall and Fall-Related Injury  Outcome: Progressing     Problem: Nausea and Vomiting  Goal: Nausea and Vomiting Relief  Outcome: Progressing     Problem: Nausea and Vomiting  Goal: Nausea and Vomiting Relief  Outcome: Progressing     Problem: Pain Acute  Goal: Optimal Pain Control and Function  Outcome: Progressing     Problem: Comorbidity Management  Goal: Blood Pressure in Desired Range  Outcome: Progressing     Problem: Surgery Nonspecified  Goal: Absence of Bleeding  Outcome: Progressing  Goal: Effective Bowel Elimination  Outcome: Progressing  Goal: Fluid and Electrolyte Balance  Outcome: Progressing  Goal: Blood Glucose Level Within Targeted Range  Outcome: Progressing  Goal: Absence of Infection Signs and Symptoms  Outcome: Progressing  Goal: Anesthesia/Sedation Recovery  Outcome: Progressing  Goal: Optimal Pain Control and Function  Outcome: Progressing  Goal: Nausea and Vomiting Relief  Outcome: Progressing  Goal: Effective Urinary Elimination  Outcome: Progressing  Goal: Effective Oxygenation and Ventilation  Outcome: Progressing     Problem: Self-Care Deficit  Goal: Improved Ability to Complete Activities of Daily Living  Outcome: Progressing

## 2023-05-01 MED ORDER — CEPHALEXIN 500 MG CAPSULE
ORAL_CAPSULE | Freq: Four times a day (QID) | ORAL | 0 refills | 3.00000 days | Status: CP
Start: 2023-05-01 — End: 2023-05-04
  Filled 2023-05-01: qty 12, 3d supply, fill #0

## 2023-05-01 MED ADMIN — fenofibrate (TRICOR) tablet 145 mg: 145 mg | ORAL | @ 13:00:00 | Stop: 2023-05-01

## 2023-05-01 MED ADMIN — amitriptyline (ELAVIL) tablet 100 mg: 100 mg | ORAL | @ 01:00:00

## 2023-05-01 MED ADMIN — cephalexin (KEFLEX) capsule 500 mg: 500 mg | ORAL | @ 03:00:00 | Stop: 2023-05-03

## 2023-05-01 MED ADMIN — HYDROmorphone (DILAUDID) tablet 4 mg: 4 mg | ORAL | @ 02:00:00 | Stop: 2023-05-10

## 2023-05-01 MED ADMIN — acetaminophen (TYLENOL) tablet 1,000 mg: 1000 mg | ORAL | @ 01:00:00

## 2023-05-01 MED ADMIN — amlodipine (NORVASC) tablet 10 mg: 10 mg | ORAL | @ 13:00:00 | Stop: 2023-05-01

## 2023-05-01 MED ADMIN — levothyroxine (SYNTHROID) tablet 150 mcg: 150 ug | ORAL | @ 13:00:00 | Stop: 2023-05-01

## 2023-05-01 MED ADMIN — methocarbamol (ROBAXIN) tablet 1,000 mg: 1000 mg | ORAL | @ 10:00:00 | Stop: 2023-05-01

## 2023-05-01 MED ADMIN — atorvastatin (LIPITOR) tablet 40 mg: 40 mg | ORAL | @ 13:00:00 | Stop: 2023-05-01

## 2023-05-01 MED ADMIN — sodium chloride (NS) 0.9 % flush 10 mL: 10 mL | INTRAVENOUS | @ 13:00:00 | Stop: 2023-05-01

## 2023-05-01 MED ADMIN — metoPROLOL tartrate (Lopressor) tablet 100 mg: 100 mg | ORAL | @ 01:00:00

## 2023-05-01 MED ADMIN — metoclopramide (REGLAN) tablet 10 mg: 10 mg | ORAL | @ 10:00:00 | Stop: 2023-05-01

## 2023-05-01 MED ADMIN — metoPROLOL tartrate (Lopressor) tablet 100 mg: 100 mg | ORAL | @ 13:00:00 | Stop: 2023-05-01

## 2023-05-01 MED ADMIN — acetaminophen (TYLENOL) tablet 1,000 mg: 1000 mg | ORAL | @ 10:00:00 | Stop: 2023-05-01

## 2023-05-01 MED ADMIN — methocarbamol (ROBAXIN) tablet 1,000 mg: 1000 mg | ORAL | @ 01:00:00

## 2023-05-01 MED ADMIN — sodium chloride (NS) 0.9 % flush 10 mL: 10 mL | INTRAVENOUS | @ 03:00:00

## 2023-05-01 MED ADMIN — HYDROmorphone (DILAUDID) tablet 4 mg: 4 mg | ORAL | @ 07:00:00 | Stop: 2023-05-01

## 2023-05-01 MED ADMIN — clopidogrel (PLAVIX) tablet 75 mg: 75 mg | ORAL | @ 13:00:00 | Stop: 2023-05-01

## 2023-05-01 MED ADMIN — tamsulosin (FLOMAX) 24 hr capsule 0.4 mg: .4 mg | ORAL | @ 13:00:00 | Stop: 2023-05-01

## 2023-05-01 MED ADMIN — HYDROmorphone (DILAUDID) tablet 4 mg: 4 mg | ORAL | @ 10:00:00 | Stop: 2023-05-01

## 2023-05-01 MED ADMIN — pantoprazole (Protonix) injection 40 mg: 40 mg | INTRAVENOUS | @ 13:00:00 | Stop: 2023-05-01

## 2023-05-01 MED ADMIN — cephalexin (KEFLEX) capsule 500 mg: 500 mg | ORAL | @ 10:00:00 | Stop: 2023-05-01

## 2023-05-01 MED ADMIN — enoxaparin (LOVENOX) syringe 40 mg: 40 mg | SUBCUTANEOUS | @ 03:00:00

## 2023-05-01 NOTE — Unmapped (Signed)
Pt alert and oriented, pain is managed with current regimen, no c/o nausea this shift, pt tolerating diet order. Pt free from falls, ambulates independently with steady gait, safety precautions implemented. Pt encouraged to participate with his care as tolerated. Encouraged to call for assistance when needed. Will continue to monitor.    Problem: Adult Inpatient Plan of Care  Goal: Plan of Care Review  Outcome: Progressing  Goal: Patient-Specific Goal (Individualized)  Outcome: Progressing  Goal: Absence of Hospital-Acquired Illness or Injury  Outcome: Progressing  Goal: Optimal Comfort and Wellbeing  Outcome: Progressing  Goal: Readiness for Transition of Care  Outcome: Progressing  Goal: Rounds/Family Conference  Outcome: Progressing     Problem: Malnutrition  Goal: Improved Nutritional Intake  Outcome: Progressing     Problem: Wound  Goal: Optimal Coping  Outcome: Progressing  Goal: Optimal Functional Ability  Outcome: Progressing  Goal: Absence of Infection Signs and Symptoms  Outcome: Progressing  Goal: Improved Oral Intake  Outcome: Progressing  Goal: Optimal Pain Control and Function  Outcome: Progressing  Goal: Skin Health and Integrity  Outcome: Progressing  Goal: Optimal Wound Healing  Outcome: Progressing     Problem: Fall Injury Risk  Goal: Absence of Fall and Fall-Related Injury  Outcome: Progressing     Problem: Nausea and Vomiting  Goal: Nausea and Vomiting Relief  Outcome: Progressing     Problem: Pain Acute  Goal: Optimal Pain Control and Function  Outcome: Progressing     Problem: Self-Care Deficit  Goal: Improved Ability to Complete Activities of Daily Living  Outcome: Progressing

## 2023-05-01 NOTE — Unmapped (Signed)
PICC LINE REMOVAL PROCEDURE NOTE    Proper PPE were utilized.  PICC line was removed per protocol.  Hemostasis was achieved.  Patient tolerated procedure well.  Gauze and transparent dressing placed.  Verbal and written site care instructions were provided.  Patient verbalized understanding.      PICC tip intact.  Measurement upon removal corresponds with insertion record    Primary nurse notified.      Thank You,    Lorelle Formosa, RN Venous Access Team [phone]     Workup Time:  30 minutes

## 2023-05-03 NOTE — Unmapped (Signed)
RN Navigator contacted pt to check on status after discharge.     Pt states that pain is well controlled on Dilaudid. Has enough to get him to appt on Thursday.    Eating eggs, grits. Tolerating well. Reports that he is having BM. Denies NV>     PT up and ambulating during the day.     Denies any other questions or concerns. Reviewed upcoming appt time and date. Encouraged to reach out with any future needs.

## 2023-05-05 ENCOUNTER — Other Ambulatory Visit: Payer: Self-pay | Admitting: Family Medicine

## 2023-05-06 ENCOUNTER — Ambulatory Visit
Admit: 2023-05-06 | Discharge: 2023-05-07 | Payer: PRIVATE HEALTH INSURANCE | Attending: Hematology & Oncology | Primary: Hematology & Oncology

## 2023-05-06 DIAGNOSIS — C181 Malignant neoplasm of appendix: Principal | ICD-10-CM

## 2023-05-06 DIAGNOSIS — C786 Secondary malignant neoplasm of retroperitoneum and peritoneum: Principal | ICD-10-CM

## 2023-05-06 DIAGNOSIS — K219 Gastro-esophageal reflux disease without esophagitis: Secondary | ICD-10-CM | POA: Diagnosis not present

## 2023-05-06 DIAGNOSIS — I2510 Atherosclerotic heart disease of native coronary artery without angina pectoris: Secondary | ICD-10-CM | POA: Diagnosis not present

## 2023-05-06 DIAGNOSIS — I69359 Hemiplegia and hemiparesis following cerebral infarction affecting unspecified side: Secondary | ICD-10-CM | POA: Diagnosis not present

## 2023-05-06 DIAGNOSIS — I10 Essential (primary) hypertension: Secondary | ICD-10-CM | POA: Diagnosis not present

## 2023-05-06 DIAGNOSIS — G893 Neoplasm related pain (acute) (chronic): Secondary | ICD-10-CM | POA: Diagnosis not present

## 2023-05-06 DIAGNOSIS — M5431 Sciatica, right side: Secondary | ICD-10-CM | POA: Diagnosis not present

## 2023-05-06 DIAGNOSIS — R911 Solitary pulmonary nodule: Secondary | ICD-10-CM | POA: Diagnosis not present

## 2023-05-06 DIAGNOSIS — Z96653 Presence of artificial knee joint, bilateral: Secondary | ICD-10-CM | POA: Diagnosis not present

## 2023-05-06 DIAGNOSIS — Z87891 Personal history of nicotine dependence: Secondary | ICD-10-CM | POA: Diagnosis not present

## 2023-05-06 DIAGNOSIS — E039 Hypothyroidism, unspecified: Secondary | ICD-10-CM | POA: Diagnosis not present

## 2023-05-06 MED ORDER — HYDROMORPHONE 4 MG TABLET
ORAL_TABLET | ORAL | 0 refills | 15 days | Status: CP | PRN
Start: 2023-05-06 — End: ?

## 2023-05-06 NOTE — Unmapped (Signed)
C/o abdominal pain. Also c/o being SOB after exertion. Will need some supplies for him.

## 2023-05-06 NOTE — Unmapped (Signed)
GI MEDICAL ONCOLOGY     CONSULTING PROVIDERS  Dr. Thornton Papas, Medical Oncology, [facility]  Dr. 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 11/15/21: Right hemicolectomy: G3 poorly diff goblet cell adenocarcinoma with focal mucinous diff, 2.4cm through visceral peritoneum, 2/19 nodes, 5 tumor deposits, pT4aN1b.   [facility] NGS:   TP53 mutation; multiple VUS:  ATM, MTHFR, SH2B3, MSH2, SRP14, ATRX, LRP1B.   TMB 4.7, MSS  2. 12/31/21-06/2022 adjuvant capecitabine x 8 cycles (dose reductions and delay d/t grade 3 HFS)   - c1 complicated by HFS.   - c2 dose reduced to 2g BID, still with HFS but shorter and less severe  - c5 delayed, dose reduced to 1500mg  BID  3. 08/2022 recurrent appendiceal w/ peritoneal carcinomatosis  4. 08/20/22 -10/2022 FOLFOX (no bevacizumab given CVA 12/23). Best response PD.   -Cy 3 complicated by diarrhea, ileus, hospitalization. Non-contrasted scan without progression or frank obstruction (ileus)  - c4 5FU only during recovery  5. 10/2022- 5/ 2024 irinotecan 180 mg/m2 q14d. Best response PD.   6. 01/2023- 03/2023 panitumumab  7. 04/13/2023 progressive small bowel obstruction-   04/16/23 exlap, resection of ileocolic anastomosis, omentectomy, gtube placement: path= metasetatic carcinoma c/w poorly diff goblet cell adenocarcinoma with mucinous diff. Extensively involves mesenery and serosa of small bowel/ colon with extensive kinking/adherence of small bowel from tumor deposits, omentum with extensive involvement of carcinoma up to 15cm    __________________________________________________________________    ASSESSMENT  1. 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 cancer pain, well palliated on hydromorphone  3. Indeterminate left lower lung nodule - minimally change concern for slow growing malignancy.  Stable.   3. Acute left CVA 07/2022 with residual weakness, s/p PCI right carotid   4.  ECOG PS 2    RECOMMENDATIONS  1. Continue supportive care-- consider lonsurf in 2week    2. Hydromorphone 4 mg q4 prn (2 week Rx given today)  3.  RTC 2 weeks for labs and visit      DISCUSSION  Reviewed operative findings and path, that resection was very successful but he does have considerable residual disease and remains at high risk for recurrent sbo. In future if it is multifocal, resection and or diversion less likely to be an option. Nothing new re chance of chemo benefit. Discussed pros and cons- he'd still like to try    Right now wound still sizeable and packing not quite ready for chemo given high chance of clinically meaningful neutropenia think we should hold on starting until has a bit more healing time. He agrees.     This was high risk medical decision making on the basis of advanced cancer and treatment with high risk for complication requiring ongoing careful monitoring for severe toxicity.     ______________________________________________________________________  HISTORY     Sergio Perez is seen today at the [facility] for ongoing management regarding appendiceal adenocarcinoma      Doing much better., able to eat but has to be cautious about bland foods.   Pain well controlled on hydromorphone q4hrs. Is taking it on time every 4 because if he skips pain increases.   Is having loose/watery stools about 3x a day, though last night was more frequent after eating stews.   Energy isnt great but he has been walking around, looking forward to  getting stronger.      MEDICAL HISTORY  1. Alcohol Use  2. Cerebellar Stroke (2017), no residual deficits, Acute left CVA 07/2022   3. CAD  4. CPPD  5. GERD  6. HTN  7. Hypothyroidism (acquired following ENT cancer treatment)  8. 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   9. Bilateral total knee replacement  10. Right side sciatica     No Known Allergies    Medications reviewed in the EMR    SOCIAL HISTORY  He lives alone in [place]. 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 He reports quitting alcohol 1 week prior to hospital admission April 2023. He previously drank a sixpack of alcohol per day. No drug use.  Sister lives in [place].  Father lives on same land in another house. 2024 got full longterm disability    PHYSICAL EXAM  There were no vitals taken for this visit.   GENERAL: well developed, well nourished, in no distress  HEENT: NCAT, pupils equal, sclerae anicteric, PSYCH: full and appropriate range of affect with good insight and judgement  LYMPH: No cervical or supraclav adenopathy  LUNGS: clear bilaterally with good air excursion and no wheezing, rhonci, rales  COR: reg,  no murmurs, rubs, gallops   GI: abdomen soft, nontender, no ascites, normoactive BS, no hepatosplenomegaly or masses. Midline incision is healing, one site still packing but without erythema. Gtube site very mild erythema.   EXT: no edema  SKIN: No rashes     OBJECTIVE DATA  LABS  Labs reviewed in emr     RADIOLOGY RESULTS   None today

## 2023-05-07 DIAGNOSIS — C181 Malignant neoplasm of appendix: Principal | ICD-10-CM

## 2023-05-18 DIAGNOSIS — C181 Malignant neoplasm of appendix: Secondary | ICD-10-CM | POA: Diagnosis not present

## 2023-05-19 NOTE — Unmapped (Unsigned)
[place] GI MEDICAL ONCOLOGY     CONSULTING PROVIDERS  Dr. Thornton Papas, Medical Oncology, [facility]  Dr. 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 11/15/21: Right hemicolectomy: G3 poorly diff goblet cell adenocarcinoma with focal mucinous diff, 2.4cm through visceral peritoneum, 2/19 nodes, 5 tumor deposits, pT4aN1b.   [facility] NGS:   TP53 mutation; multiple VUS:  ATM, MTHFR, SH2B3, MSH2, SRP14, ATRX, LRP1B.   TMB 4.7, MSS  2. 12/31/21-06/2022 adjuvant capecitabine x 8 cycles (dose reductions and delay d/t grade 3 HFS)   - c1 complicated by HFS.   - c2 dose reduced to 2g BID, still with HFS but shorter and less severe  - c5 delayed, dose reduced to 1500mg  BID  3. 08/2022 recurrent appendiceal w/ peritoneal carcinomatosis  4. 08/20/22 -10/2022 FOLFOX (no bevacizumab given CVA 12/23). Best response PD.   -Cy 3 complicated by diarrhea, ileus, hospitalization. Non-contrasted scan without progression or frank obstruction (ileus)  - c4 5FU only during recovery  5. 10/2022- 5/ 2024 irinotecan 180 mg/m2 q14d. Best response PD.   6. 01/2023- 03/2023 panitumumab  7. 04/13/2023 progressive small bowel obstruction-   04/16/23 exlap, resection of ileocolic anastomosis, omentectomy, gtube placement: path= metasetatic carcinoma c/w poorly diff goblet cell adenocarcinoma with mucinous diff. Extensively involves mesenery and serosa of small bowel/ colon with extensive kinking/adherence of small bowel from tumor deposits, omentum with extensive involvement of carcinoma up to 15cm  Repeat [employer] xT: MSS, TMB 5.8; RAS/RAF WT, TP53 mutation  __________________________________________________________________    ASSESSMENT  1. 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 cancer pain, well palliated on hydromorphone  3. Indeterminate left lower lung nodule - minimally change concern for slow growing malignancy.  Stable.   3. Acute left CVA 07/2022 with residual weakness, s/p PCI right carotid   4.  ECOG PS 2    RECOMMENDATIONS  1. Ok to start lonsurf . No bev given recent surgery and CVA history  - subsequent option could try sunitinib on TAPUR trial for FGFR3 mutation, currently not eligible as doesn't have measurable disease.   2. Hydromorphone 4 mg q4 prn (2 week Rx given today)  3.  RTC 2 weeks for labs and visit      DISCUSSION       This was high risk medical decision making on the basis of advanced cancer and treatment with high risk for complication requiring ongoing careful monitoring for severe toxicity.     ______________________________________________________________________  HISTORY     Sergio Perez is seen today at the [facility] for ongoing management regarding appendiceal adenocarcinoma     Pain  Appetite  Energy   Incision      MEDICAL HISTORY  1. Alcohol Use  2. Cerebellar Stroke (2017), no residual deficits, Acute left CVA 07/2022   3. CAD  4. CPPD  5. GERD  6. HTN  7. Hypothyroidism (acquired following ENT cancer treatment)  8. 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   9. Bilateral total knee replacement  10. Right side sciatica     No Known Allergies    Medications reviewed in the EMR    SOCIAL HISTORY  He lives alone in [place]. 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 He reports  quitting alcohol 1 week prior to hospital admission April 2023. He previously drank a sixpack of alcohol per day. No drug use.  Sister lives in [place].  Father lives on same land in another house. 2024 got full longterm disability    PHYSICAL EXAM  There were no vitals taken for this visit.   GENERAL: well developed, well nourished, in no distress  HEENT: NCAT, pupils equal, sclerae anicteric, PSYCH: full and appropriate range of affect with good insight and judgement  LYMPH: No cervical or supraclav adenopathy  LUNGS: clear bilaterally with good air excursion and no wheezing, rhonci, rales  COR: reg,  no murmurs, rubs, gallops   GI: abdomen soft, nontender, no ascites, normoactive BS, no hepatosplenomegaly or masses. Midline incision is healing, one site still packing but without erythema. Gtube site very mild erythema.   EXT: no edema  SKIN: No rashes     OBJECTIVE DATA  LABS  Labs reviewed in emr     RADIOLOGY RESULTS   None today

## 2023-05-19 NOTE — Unmapped (Signed)
Received mychart message from patient's sister requesting an appointment with surgical-oncology to remove G tube. I called the patient this morning to further assess and arrange a post-op appointment with Dr. Pearlean Brownie. G tube placed 9/13 during surgery for malignant SBO.     Unfortunately, no availability with surgical oncology tomorrow. Offered patient appointment next week with Dr. Pearlean Brownie for post op check and evaluation of G tube. Patient agreeable to this.     Patient tells me that his G tube is having drainage around the tube, yellowish drainage. Skin irritated. Says tube is pulling in and out. I gave him a few suggestions - put split guaze under the disc to protect skin, secure tube with tape. Instructed him to ask for these supplies when he is at his appointment with Dr. Forbes Cellar tomorrow. I asked him to send me a picture of the G tube site.

## 2023-05-23 MED ORDER — HYDROMORPHONE 4 MG TABLET
ORAL_TABLET | ORAL | 0 refills | 15 days | PRN
Start: 2023-05-23 — End: ?

## 2023-05-24 MED ORDER — HYDROMORPHONE 4 MG TABLET
ORAL_TABLET | ORAL | 0 refills | 15 days | Status: CP | PRN
Start: 2023-05-24 — End: ?

## 2023-05-27 NOTE — Unmapped (Signed)
RN Navigator contacted pt to follow up on missed call. Pt reports that he is not feeling well today so needed to cancel. Pt reports difficulty sleeping and diarrhea. States that he has had difficulty sleeping for years.     Having diarrhea 2-3 times/day since surgery. Reports moderate amt of liquid stool with each episode. Drinking fluids. Tolerating po intake. Had grits , sausage biscuit and coffee this morning without any issues. Denies NV.    Pt reports only issue is with G tube. Pt states he is not using and just wants to get it out. Pt in touch with the surg onc team to reschedule yesterday's missed appt.     Pt reports that surgical wound is healing well.     Pt rescheduled for 10/31 with Dr. Forbes Cellar

## 2023-05-27 NOTE — Unmapped (Unsigned)
 [place] GI MEDICAL ONCOLOGY     CONSULTING PROVIDERS  Dr. Thornton Papas, Medical Oncology, [facility]  Dr. 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 11/15/21: Right hemicolectomy: G3 poorly diff goblet cell adenocarcinoma with focal mucinous diff, 2.4cm through visceral peritoneum, 2/19 nodes, 5 tumor deposits, pT4aN1b.   [facility] NGS:   TP53 mutation; multiple VUS:  ATM, MTHFR, SH2B3, MSH2, SRP14, ATRX, LRP1B.   TMB 4.7, MSS  2. 12/31/21-06/2022 adjuvant capecitabine x 8 cycles (dose reductions and delay d/t grade 3 HFS)   - c1 complicated by HFS.   - c2 dose reduced to 2g BID, still with HFS but shorter and less severe  - c5 delayed, dose reduced to 1500mg  BID  3. 08/2022 recurrent appendiceal w/ peritoneal carcinomatosis  4. 08/20/22 -10/2022 FOLFOX (no bevacizumab given CVA 12/23). Best response PD.   -Cy 3 complicated by diarrhea, ileus, hospitalization. Non-contrasted scan without progression or frank obstruction (ileus)  - c4 5FU only during recovery  5. 10/2022- 5/ 2024 irinotecan 180 mg/m2 q14d. Best response PD.   6. 01/2023- 03/2023 panitumumab  7. 04/13/2023 progressive small bowel obstruction-   04/16/23 exlap, resection of ileocolic anastomosis, omentectomy, gtube placement: path= metasetatic carcinoma c/w poorly diff goblet cell adenocarcinoma with mucinous diff. Extensively involves mesenery and serosa of small bowel/ colon with extensive kinking/adherence of small bowel from tumor deposits, omentum with extensive involvement of carcinoma up to 15cm  Repeat [employer] xT: MSS, TMB 5.8; RAS/RAF WT, TP53 mutation  __________________________________________________________________    ASSESSMENT  1. 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 cancer pain, well palliated on hydromorphone  3. Indeterminate left lower lung nodule - minimally change concern for slow growing malignancy.  Stable.   3. Acute left CVA 07/2022 with residual weakness, s/p PCI right carotid   4.  ECOG PS 2    RECOMMENDATIONS  1. Ok to start lonsurf . No bev given recent surgery and CVA history  - subsequent option could try sunitinib on TAPUR trial for FGFR3 mutation, currently not eligible as doesn't have measurable disease.   2. Hydromorphone 4 mg q4 prn (2 week Rx given today)  3.  RTC 2 weeks for labs and visit      DISCUSSION       This was high risk medical decision making on the basis of advanced cancer and treatment with high risk for complication requiring ongoing careful monitoring for severe toxicity.     ______________________________________________________________________  HISTORY     Sergio Perez is seen today at the [facility] for ongoing management regarding appendiceal adenocarcinoma     Pain  Appetite  Energy   Incision      MEDICAL HISTORY  1. Alcohol Use  2. Cerebellar Stroke (2017), no residual deficits, Acute left CVA 07/2022   3. CAD  4. CPPD  5. GERD  6. HTN  7. Hypothyroidism (acquired following ENT cancer treatment)  8. 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   9. Bilateral total knee replacement  10. Right side sciatica     No Known Allergies    Medications reviewed in the EMR    SOCIAL HISTORY  He lives alone in [place]. 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 He reports  quitting alcohol 1 week prior to hospital admission April 2023. He previously drank a sixpack of alcohol per day. No drug use.  Sister lives in [place].  Father lives on same land in another house. 2024 got full longterm disability    PHYSICAL EXAM  There were no vitals taken for this visit.   GENERAL: well developed, well nourished, in no distress  HEENT: NCAT, pupils equal, sclerae anicteric, PSYCH: full and appropriate range of affect with good insight and judgement  LYMPH: No cervical or supraclav adenopathy  LUNGS: clear bilaterally with good air excursion and no wheezing, rhonci, rales  COR: reg,  no murmurs, rubs, gallops   GI: abdomen soft, nontender, no ascites, normoactive BS, no hepatosplenomegaly or masses. Midline incision is healing, one site still packing but without erythema. Gtube site very mild erythema.   EXT: no edema  SKIN: No rashes     OBJECTIVE DATA  LABS  Labs reviewed in emr     RADIOLOGY RESULTS   None today

## 2023-06-02 NOTE — Unmapped (Signed)
Sergio Perez has been contacted in regards to their refill of LONSURF 15-6.14 mg tablet (trifluridine-tipiracil). At this time, they have declined refill due to  patient not taking previous dose due to hospitilization per sister via MyChart . Refill assessment call date has been updated per the patient's request.

## 2023-06-03 ENCOUNTER — Institutional Professional Consult (permissible substitution): Admit: 2023-06-03 | Discharge: 2023-06-04 | Payer: BLUE CROSS/BLUE SHIELD

## 2023-06-03 ENCOUNTER — Ambulatory Visit
Admit: 2023-06-03 | Discharge: 2023-06-04 | Payer: BLUE CROSS/BLUE SHIELD | Attending: Hematology & Oncology | Primary: Hematology & Oncology

## 2023-06-03 DIAGNOSIS — C786 Secondary malignant neoplasm of retroperitoneum and peritoneum: Principal | ICD-10-CM

## 2023-06-03 DIAGNOSIS — C181 Malignant neoplasm of appendix: Principal | ICD-10-CM

## 2023-06-03 DIAGNOSIS — I10 Essential (primary) hypertension: Secondary | ICD-10-CM | POA: Diagnosis not present

## 2023-06-03 DIAGNOSIS — Z87891 Personal history of nicotine dependence: Secondary | ICD-10-CM | POA: Diagnosis not present

## 2023-06-03 DIAGNOSIS — I69354 Hemiplegia and hemiparesis following cerebral infarction affecting left non-dominant side: Secondary | ICD-10-CM | POA: Diagnosis not present

## 2023-06-03 DIAGNOSIS — I2510 Atherosclerotic heart disease of native coronary artery without angina pectoris: Secondary | ICD-10-CM | POA: Diagnosis not present

## 2023-06-03 LAB — CBC W/ AUTO DIFF
BASOPHILS ABSOLUTE COUNT: 0.1 10*9/L (ref 0.0–0.1)
BASOPHILS RELATIVE PERCENT: 0.8 %
EOSINOPHILS ABSOLUTE COUNT: 0.2 10*9/L (ref 0.0–0.5)
EOSINOPHILS RELATIVE PERCENT: 2.6 %
HEMATOCRIT: 32.5 % — ABNORMAL LOW (ref 39.0–48.0)
HEMOGLOBIN: 11 g/dL — ABNORMAL LOW (ref 12.9–16.5)
LYMPHOCYTES ABSOLUTE COUNT: 1 10*9/L — ABNORMAL LOW (ref 1.1–3.6)
LYMPHOCYTES RELATIVE PERCENT: 13 %
MEAN CORPUSCULAR HEMOGLOBIN CONC: 33.6 g/dL (ref 32.0–36.0)
MEAN CORPUSCULAR HEMOGLOBIN: 27.8 pg (ref 25.9–32.4)
MEAN CORPUSCULAR VOLUME: 82.6 fL (ref 77.6–95.7)
MEAN PLATELET VOLUME: 6.8 fL (ref 6.8–10.7)
MONOCYTES ABSOLUTE COUNT: 0.7 10*9/L (ref 0.3–0.8)
MONOCYTES RELATIVE PERCENT: 8.3 %
NEUTROPHILS ABSOLUTE COUNT: 6 10*9/L (ref 1.8–7.8)
NEUTROPHILS RELATIVE PERCENT: 75.3 %
NUCLEATED RED BLOOD CELLS: 0 /100{WBCs} (ref <=4)
PLATELET COUNT: 515 10*9/L — ABNORMAL HIGH (ref 150–450)
RED BLOOD CELL COUNT: 3.94 10*12/L — ABNORMAL LOW (ref 4.26–5.60)
RED CELL DISTRIBUTION WIDTH: 15 % (ref 12.2–15.2)
WBC ADJUSTED: 7.9 10*9/L (ref 3.6–11.2)

## 2023-06-03 LAB — COMPREHENSIVE METABOLIC PANEL
ALBUMIN: 2.9 g/dL — ABNORMAL LOW (ref 3.4–5.0)
ALKALINE PHOSPHATASE: 99 U/L (ref 46–116)
ALT (SGPT): <7 U/L — ABNORMAL LOW (ref 10–49)
ANION GAP: 9 mmol/L (ref 5–14)
AST (SGOT): 12 U/L (ref <=34)
BILIRUBIN TOTAL: 0.3 mg/dL (ref 0.3–1.2)
BLOOD UREA NITROGEN: 7 mg/dL — ABNORMAL LOW (ref 9–23)
BUN / CREAT RATIO: 10
CALCIUM: 7 mg/dL — ABNORMAL LOW (ref 8.7–10.4)
CHLORIDE: 105 mmol/L (ref 98–107)
CO2: 28.3 mmol/L (ref 20.0–31.0)
CREATININE: 0.68 mg/dL — ABNORMAL LOW
EGFR CKD-EPI (2021) MALE: >90 mL/min/{1.73_m2} (ref >=60)
GLUCOSE RANDOM: 110 mg/dL (ref 70–179)
POTASSIUM: 3.4 mmol/L (ref 3.4–4.8)
PROTEIN TOTAL: 7.2 g/dL (ref 5.7–8.2)
SODIUM: 142 mmol/L (ref 135–145)

## 2023-06-03 LAB — CEA: CARCINOEMBRYONIC ANTIGEN: 4 ng/mL (ref 0.0–5.0)

## 2023-06-03 LAB — CANCER ANTIGEN 19-9: CA 19-9: 26.74 U/mL (ref 0–35)

## 2023-06-03 LAB — CA 125: CA 125: 27 U/mL (ref 0–35)

## 2023-06-03 MED ORDER — PROCHLORPERAZINE MALEATE 10 MG TABLET
ORAL_TABLET | 0 refills | 0 days
Start: 2023-06-03 — End: ?

## 2023-06-03 MED ADMIN — heparin, porcine (PF) 100 unit/mL injection 500 Units: 500 [IU] | INTRAVENOUS | @ 13:00:00 | Stop: 2023-06-03

## 2023-06-03 NOTE — Unmapped (Signed)
[place] GI MEDICAL ONCOLOGY     CONSULTING PROVIDERS  Dr. Thornton Papas, Medical Oncology, [facility]  Dr. 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 11/15/21: Right hemicolectomy: G3 poorly diff goblet cell adenocarcinoma with focal mucinous diff, 2.4cm through visceral peritoneum, 2/19 nodes, 5 tumor deposits, pT4aN1b.   [employer] NGS:   TP53 mutation; multiple VUS:  ATM, MTHFR, SH2B3, MSH2, SRP14, ATRX, LRP1B.   TMB 4.7, MSS  2. 12/31/21-06/2022 adjuvant capecitabine x 8 cycles (dose reductions and delay d/t grade 3 HFS)   - c1 complicated by HFS.   - c2 dose reduced to 2g BID, still with HFS but shorter and less severe  - c5 delayed, dose reduced to 1500mg  BID  3. 08/2022 recurrent appendiceal w/ peritoneal carcinomatosis  4. 08/20/22 -10/2022 FOLFOX (no bevacizumab given CVA 12/23). Best response PD.   -Cy 3 complicated by diarrhea, ileus, hospitalization. Non-contrasted scan without progression or frank obstruction (ileus)  - c4 5FU only during recovery  5. 10/2022- 5/ 2024 irinotecan 180 mg/m2 q14d. Best response PD.   6. 01/2023- 03/2023 panitumumab  7. 04/13/2023 progressive small bowel obstruction-   04/16/23 exlap, resection of ileocolic anastomosis, omentectomy, gtube placement: path= metasetatic carcinoma c/w poorly diff goblet cell adenocarcinoma with mucinous diff. Extensively involves mesenery and serosa of small bowel/ colon with extensive kinking/adherence of small bowel from tumor deposits, omentum with extensive involvement of carcinoma up to 15cm  Repeat [employer] xT: MSS, TMB 5.8; RAS/RAF WT, TP53 mutation. VUS: ATM, ARTX, FGFR3, etc. See reports.   8. 06/04/2023 lonsurf start (75 mg bid 5 on and  9 off of 14d cycles)  __________________________________________________________________    ASSESSMENT  1. 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 cancer pain increasing, well palliated on hydromorphone  3. Indeterminate left lower lung nodule - minimally change concern for slow growing malignancy.  Stable.   3. Acute left CVA 07/2022 with residual weakness, s/p PCI right carotid   4.  ECOG PS 2    RECOMMENDATIONS  1. Ok to start lonsurf . No bev given recent surgery and CVA history  - subsequent option could try sunitinib on TAPUR trial for FGFR3 mutation, currently not eligible as doesn't have measurable disease.   2. Hydromorphone 4 mg q4 prn    3.  RTC 2 weeks for new baseline scan, labs and visit. Staging after 2 cycles (will be short interval)      DISCUSSION  Sergio Perez's symptoms are increasing again, I suspect his disease has already grown back quite a bit. He has a substantial symptom burden, but he feels that he would liek to try the lonsurf. We reviewed AEs again, and he will start the 5/9 cycle this week.     Reviewed recent NGS. Does have a few new variants of unknown significance. We could maybe try and get him on TAPUR for the new FGFR3 mutation but I am not all that enthusiastic about sutent for that mutation nor his tolerance of sutent. We reassess based on how he is feeling post lonsurf.    This was high risk medical decision making on the basis of advanced cancer and treatment with high risk for complication requiring ongoing careful monitoring for severe toxicity.     ______________________________________________________________________  HISTORY     Sergio Perez is seen today at the [facility] On[facility] for ongoing  management regarding appendiceal adenocarcinoma     Pain increasing again- having the stabbing pains in abd. The groin pain is starting back. Mostly controlled by dilaudid  Appetite not great but he is able to eat and has kept his weight fairly steady  Energy not great but he remains active at hoe  Incision healed. Would like the gtube out.     MEDICAL HISTORY  1. Alcohol Use  2. Cerebellar Stroke (2017), no residual deficits, Acute left CVA 07/2022   3. CAD  4. CPPD  5. GERD  6. HTN  7. Hypothyroidism (acquired following ENT cancer treatment)  8. 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   9. Bilateral total knee replacement  10. Right side sciatica     No Known Allergies    Medications reviewed in the EMR    SOCIAL HISTORY  He lives alone in [place]. 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 He reports quitting alcohol 1 week prior to hospital admission April 2023. He previously drank a sixpack of alcohol per day. No drug use.  Sister lives in [place].  Father lives on same land in another house. 2024 got full longterm disability    PHYSICAL EXAM  There were no vitals taken for this visit.   GENERAL: well developed, well nourished, in no distress  HEENT: NCAT, pupils equal, sclerae anicteric, PSYCH: full and appropriate range of affect with good insight and judgement  LYMPH: No cervical or supraclav adenopathy  LUNGS: clear bilaterally with good air excursion and no wheezing, rhonci, rales  COR: reg,  no murmurs, rubs, gallops   GI: abdomen not as soft as it was, I cannot feel frank masses but firmer, particularly just below the more superior aspect of incision, mild diffuse tenderness worse in RUQ, no obvious ascites, normoactive BS,  Midline incision healed  EXT: no edema  SKIN: No rashes     OBJECTIVE DATA  LABS  Labs reviewed in emr     RADIOLOGY RESULTS   None today

## 2023-06-03 NOTE — Unmapped (Signed)
C/o SOB and states he gets scared sometimes because he can't catch his breath.

## 2023-06-07 ENCOUNTER — Ambulatory Visit: Admit: 2023-06-07 | Discharge: 2023-06-08 | Payer: BLUE CROSS/BLUE SHIELD

## 2023-06-07 DIAGNOSIS — Z931 Gastrostomy status: Principal | ICD-10-CM

## 2023-06-07 DIAGNOSIS — C482 Malignant neoplasm of peritoneum, unspecified: Secondary | ICD-10-CM | POA: Diagnosis not present

## 2023-06-07 DIAGNOSIS — Z431 Encounter for attention to gastrostomy: Secondary | ICD-10-CM | POA: Diagnosis not present

## 2023-06-07 NOTE — Unmapped (Signed)
Patient Name: Sergio Perez  Patient Age: 62 y.o.  Encounter Date: 06/07/2023     Attending Physician:  Camillo Flaming*     Referring Physician:   Oneita Hurt, None Per Patient  [address]  [place],  [place] [address]     Primary Care Provider:  Forbes Cellar, Mabeline Caras, MD     CONSULTING PHYSICIANS:  Patient Care Team:  Forbes Cellar Mabeline Caras, MD as PCP - General (Medical Oncology)  Evelena Peat, RN as Registered Nurse (Oncology Navigator)  Forbes Cellar, Mabeline Caras, MD as Consulting Physician (Medical Oncology)  Jaci Carrel, ANP as Nurse Practitioner (Hematology and Oncology)  Crist Fat as Patient Navigator (Oncology)     Diagnosis:  Goblet Cell Mucinous Appendiceal Adenocarcinoma  s/p  11/15/21: Right hemicolectomy and 04/16/23 exlap, resection of ileocolic anastomosis, omentectomy, gtube placement: path= metasetatic carcinoma c/w poorly diff goblet cell adenocarcinoma with mucinous diff     CANCER HISTORY  Diagnosis: 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 11/15/21: Right hemicolectomy: G3 poorly diff goblet cell adenocarcinoma with focal mucinous diff, 2.4cm through visceral peritoneum, 2/19 nodes, 5 tumor deposits, pT4aN1b.   [facility] NGS:   TP53 mutation; multiple VUS:  ATM, MTHFR, SH2B3, MSH2, SRP14, ATRX, LRP1B.   TMB 4.7, MSS  2. 12/31/21-06/2022 adjuvant capecitabine x 8 cycles (dose reductions and delay d/t grade 3 HFS)   - c1 complicated by HFS.   - c2 dose reduced to 2g BID, still with HFS but shorter and less severe  - c5 delayed, dose reduced to 1500mg  BID  3. 08/2022 recurrent appendiceal w/ peritoneal carcinomatosis  4. 08/20/22 -10/2022 FOLFOX (no bevacizumab given CVA 12/23). Best response PD.   -Cy 3 complicated by diarrhea, ileus, hospitalization. Non-contrasted scan without progression or frank obstruction (ileus)  - c4 5FU only during recovery  5. 10/2022- 5/ 2024 irinotecan 180 mg/m2 q14d. Best response PD.   6. 01/2023- 03/2023 panitumumab  7. 04/13/2023 progressive small bowel obstruction-   04/16/23 exlap, resection of ileocolic anastomosis, omentectomy, gtube placement: path= metasetatic carcinoma c/w poorly diff goblet cell adenocarcinoma with mucinous diff. Extensively involves mesenery and serosa of small bowel/ colon with extensive kinking/adherence of small bowel from tumor deposits, omentum with extensive involvement of carcinoma up to 15cm  Repeat [facility] xT: MSS, TMB 5.8; RAS/RAF WT, TP53 mutation. VUS: ATM, ARTX, FGFR3, etc. See reports.   8. 06/04/2023 lonsurf start (75 mg bid 5 on and  9 off of 14d cycles)    Follow Up Note:     Sergio Perez is a 62 y.o. male  with history of CVA (07/2022) on Plavix, Stage III Goblet cell mucinous appendiceal adenocarcinoma s/p right hemicolectomy (11/2021) with adjuvant chemotherapy (Capecitabine x 8cycles) c/b recurrence and peritoneal carcinomatosis (08/2022) s/p FOLFOX (08/2022 - 10/2022), Irinotecan (10/2022 - 12/2022) and Panitumumab (01/2023 - Present) with ongoing progression of disease, presented for and admitted with abdominal pain, nausea, emesis and concern for a malignant SBO on imaging. G tube was placed for venting in case of future obstruction develops.     Now s/p ex lap, small bowel resection, new ileocolic side-to-side anastomosis and omentectomy on 04/16/23. 47fr malecot G tube in place. He was  Here for followup for G-tube and G-tube removal.       Sergio Perez is feeling well, with no specific concerns.  He is tolerating a regular diet, and has a decent appetite. He states his weight has been stable. He has not used his feeding tube since  he was in the hospital, he was discharged on 05/01/23.  He has some nausea on occasion, no emesis, having regular BM.         Vital Signs for this encounter:  Vitals:    06/07/23 0838   BP: 123/87   Pulse: 82   Resp: 16   Temp: 36.1 ??C (96.9 ??F)   SpO2: 99%     Wt Readings from Last 3 Encounters:   06/03/23 85.1 kg (187 lb 11.2 oz)   05/06/23 85.7 kg (189 lb)   04/24/23 91.9 kg (202 lb 9.6 oz)        General Appearance:  No acute distress, well appearing and well nourished. A&0 x 3.   Eyes:  Conjuctiva and lids appear normal. Pupils equal and round, sclera anicteric.   Pulmonary:    Normal respiratory effort.    Cardiovascular:  Regular rate and rhythm   Abdomen:   Soft, non-tender, without masses.  No hepatosplenomegaly. No hernias appreciated. Normoactive bowel sounds.  Midline Incision is healing well, LUQ G-tube removed without incident, patient tolerated this well.    Musculoskeletal: Normal gait.  Extremities without clubbing, cyanosis, or edema.   Neurologic:  Lymphatic: No motor abnormalities noted.  Sensation grossly intact.  No cervical or supraclavicular LAD noted.      Diagnostic Studies:  N/a     Imaging:  N/a    Assessment:Sergio Perez is a 62 y.o. male who is seen in followup for G-tube removal. Goblet Cell Mucinous Appendiceal Adenocarcinoma s/p 04/16/23 exlap, resection of ileocolic anastomosis, omentectomy, gtube placement for venting in case of future obstruction. Patient has not used tube since discharge. He is actively receiving treatment with Dr. Forbes Cellar and began lonsurf on 06/04/23.      Plan:   G-tube removed without incident today.   Return to Dr. Forbes Cellar for long term management.   Our contact information was given for any further questions or concerns.    Sergio Barre, NP  Surgical Oncology

## 2023-06-07 NOTE — Unmapped (Signed)
Pt doesn't feel well today from chemo. Pt would like for drain to be removed today because I can't take it anymore

## 2023-06-08 ENCOUNTER — Other Ambulatory Visit: Payer: Self-pay | Admitting: Family Medicine

## 2023-06-08 DIAGNOSIS — N400 Enlarged prostate without lower urinary tract symptoms: Secondary | ICD-10-CM

## 2023-06-08 DIAGNOSIS — Z789 Other specified health status: Secondary | ICD-10-CM

## 2023-06-08 DIAGNOSIS — E781 Pure hyperglyceridemia: Secondary | ICD-10-CM

## 2023-06-08 DIAGNOSIS — C181 Malignant neoplasm of appendix: Secondary | ICD-10-CM

## 2023-06-08 DIAGNOSIS — C786 Secondary malignant neoplasm of retroperitoneum and peritoneum: Secondary | ICD-10-CM

## 2023-06-08 DIAGNOSIS — E039 Hypothyroidism, unspecified: Secondary | ICD-10-CM

## 2023-06-09 ENCOUNTER — Other Ambulatory Visit: Payer: BC Managed Care – PPO

## 2023-06-12 ENCOUNTER — Other Ambulatory Visit: Payer: Self-pay | Admitting: Family Medicine

## 2023-06-12 DIAGNOSIS — I10 Essential (primary) hypertension: Secondary | ICD-10-CM

## 2023-06-15 MED ORDER — HYDROMORPHONE 4 MG TABLET
ORAL_TABLET | ORAL | 0 refills | 15 days | Status: CP | PRN
Start: 2023-06-15 — End: ?

## 2023-06-15 NOTE — Unmapped (Signed)
RN Navigator attempted to contact pt's sister, Jasmine December, to follow up on Mychart messages.  Reached identified VM. Left message and encouraged call back.

## 2023-06-16 ENCOUNTER — Encounter: Payer: BC Managed Care – PPO | Admitting: Family Medicine

## 2023-06-16 NOTE — Unmapped (Signed)
RN Navigator called pt's sister, Jasmine December, to follow up on her Mychart message.    Pt c/o NV over the weekend. Took last Lonsurf tablet last Tuesday. Pt was not taking antiemetics. Began alternating zofran and compazine on Monday or Tuesday of this week.     Pt having diarrhea. States that he has had diarrhea since surgery.     Decreased appetite. Ate some egg yesterday. Pt's sister is not with him to know in more detail. Pt continues to sip on Pedialyte.     Scheduled for labs and provider visit on THursday. Appt added on for fluids if needed. Encouraged Jasmine December to call with any questions or concerns. (Planning to move labs and Pharm visit for an 11am start in order to cluster pts appts- scheduling contacted with request)

## 2023-06-17 ENCOUNTER — Ambulatory Visit: Admit: 2023-06-17 | Discharge: 2023-06-18 | Payer: BLUE CROSS/BLUE SHIELD

## 2023-06-17 ENCOUNTER — Ambulatory Visit: Admit: 2023-06-17 | Discharge: 2023-06-18 | Payer: BLUE CROSS/BLUE SHIELD | Attending: Oncology | Primary: Oncology

## 2023-06-17 ENCOUNTER — Institutional Professional Consult (permissible substitution): Admit: 2023-06-17 | Discharge: 2023-06-18 | Payer: BLUE CROSS/BLUE SHIELD

## 2023-06-17 DIAGNOSIS — C181 Malignant neoplasm of appendix: Principal | ICD-10-CM

## 2023-06-17 DIAGNOSIS — C786 Secondary malignant neoplasm of retroperitoneum and peritoneum: Principal | ICD-10-CM

## 2023-06-17 DIAGNOSIS — E876 Hypokalemia: Principal | ICD-10-CM

## 2023-06-17 DIAGNOSIS — R911 Solitary pulmonary nodule: Secondary | ICD-10-CM | POA: Diagnosis not present

## 2023-06-17 DIAGNOSIS — R918 Other nonspecific abnormal finding of lung field: Secondary | ICD-10-CM | POA: Diagnosis not present

## 2023-06-17 DIAGNOSIS — K769 Liver disease, unspecified: Secondary | ICD-10-CM | POA: Diagnosis not present

## 2023-06-17 DIAGNOSIS — K7689 Other specified diseases of liver: Secondary | ICD-10-CM | POA: Diagnosis not present

## 2023-06-17 DIAGNOSIS — J984 Other disorders of lung: Secondary | ICD-10-CM | POA: Diagnosis not present

## 2023-06-17 DIAGNOSIS — R188 Other ascites: Secondary | ICD-10-CM | POA: Diagnosis not present

## 2023-06-17 LAB — COMPREHENSIVE METABOLIC PANEL
ALBUMIN: 3 g/dL — ABNORMAL LOW (ref 3.4–5.0)
ALKALINE PHOSPHATASE: 104 U/L (ref 46–116)
ALT (SGPT): <7 U/L — ABNORMAL LOW (ref 10–49)
ANION GAP: 12 mmol/L (ref 5–14)
AST (SGOT): 17 U/L (ref <=34)
BILIRUBIN TOTAL: 0.6 mg/dL (ref 0.3–1.2)
BLOOD UREA NITROGEN: 10 mg/dL (ref 9–23)
BUN / CREAT RATIO: 15
CALCIUM: 6.5 mg/dL — ABNORMAL LOW (ref 8.7–10.4)
CHLORIDE: 101 mmol/L (ref 98–107)
CO2: 27.2 mmol/L (ref 20.0–31.0)
CREATININE: 0.67 mg/dL — ABNORMAL LOW
EGFR CKD-EPI (2021) MALE: >90 mL/min/{1.73_m2} (ref >=60)
GLUCOSE RANDOM: 95 mg/dL (ref 70–179)
POTASSIUM: 3.3 mmol/L — ABNORMAL LOW (ref 3.4–4.8)
PROTEIN TOTAL: 7.3 g/dL (ref 5.7–8.2)
SODIUM: 140 mmol/L (ref 135–145)

## 2023-06-17 LAB — CBC W/ AUTO DIFF
BASOPHILS ABSOLUTE COUNT: 0.1 10*9/L (ref 0.0–0.1)
BASOPHILS RELATIVE PERCENT: 1 %
EOSINOPHILS ABSOLUTE COUNT: 0.1 10*9/L (ref 0.0–0.5)
EOSINOPHILS RELATIVE PERCENT: 1.6 %
HEMATOCRIT: 32.7 % — ABNORMAL LOW (ref 39.0–48.0)
HEMOGLOBIN: 10.9 g/dL — ABNORMAL LOW (ref 12.9–16.5)
LYMPHOCYTES ABSOLUTE COUNT: 1.2 10*9/L (ref 1.1–3.6)
LYMPHOCYTES RELATIVE PERCENT: 17.4 %
MEAN CORPUSCULAR HEMOGLOBIN CONC: 33.2 g/dL (ref 32.0–36.0)
MEAN CORPUSCULAR HEMOGLOBIN: 27.4 pg (ref 25.9–32.4)
MEAN CORPUSCULAR VOLUME: 82.6 fL (ref 77.6–95.7)
MEAN PLATELET VOLUME: 7.6 fL (ref 6.8–10.7)
MONOCYTES ABSOLUTE COUNT: 0.3 10*9/L (ref 0.3–0.8)
MONOCYTES RELATIVE PERCENT: 4.3 %
NEUTROPHILS ABSOLUTE COUNT: 5.3 10*9/L (ref 1.8–7.8)
NEUTROPHILS RELATIVE PERCENT: 75.7 %
NUCLEATED RED BLOOD CELLS: 0 /100{WBCs} (ref <=4)
PLATELET COUNT: 259 10*9/L (ref 150–450)
RED BLOOD CELL COUNT: 3.96 10*12/L — ABNORMAL LOW (ref 4.26–5.60)
RED CELL DISTRIBUTION WIDTH: 15.5 % — ABNORMAL HIGH (ref 12.2–15.2)
WBC ADJUSTED: 7 10*9/L (ref 3.6–11.2)

## 2023-06-17 LAB — CANCER ANTIGEN 19-9: CA 19-9: 31.49 U/mL (ref 0–35)

## 2023-06-17 LAB — CA 125: CA 125: 23 U/mL (ref 0–35)

## 2023-06-17 LAB — CEA: CARCINOEMBRYONIC ANTIGEN: 6.2 ng/mL — ABNORMAL HIGH (ref 0.0–5.0)

## 2023-06-17 MED ADMIN — iohexol (OMNIPAQUE) 350 mg iodine/mL solution 100 mL: 100 mL | INTRAVENOUS | @ 18:00:00 | Stop: 2023-06-17

## 2023-06-17 MED ADMIN — heparin, porcine (PF) 100 unit/mL injection 500 Units: 500 [IU] | INTRAVENOUS | @ 20:00:00 | Stop: 2023-06-17

## 2023-06-17 MED ADMIN — sodium chloride (NS) 0.9 % infusion: 1000 mL/h | INTRAVENOUS | @ 19:00:00 | Stop: 2023-06-17

## 2023-06-17 MED ADMIN — potassium chloride ER tablet 40 mEq: 40 meq | ORAL | @ 19:00:00 | Stop: 2023-06-17

## 2023-06-17 MED ADMIN — heparin, porcine (PF) 100 unit/mL injection 500 Units: 500 [IU] | INTRAVENOUS | @ 16:00:00 | Stop: 2023-06-17

## 2023-06-17 NOTE — Unmapped (Signed)
Patient arrived in the infusion clinic at 1354.  Weight and Vitals were obtained. Port was accessed during nurse visit, flushed with blood return, dressing clean, dry, and intact.  Labs were drawn during nurse visit. Per therapy plan, patient required oral potassium replacement. Patient also received 1L normal saline bolus. Port flushed with brisk blood return, heparin locked, and de-accessed with area covered with bandaid.  Patient instructed to stop by check out desk to complete visit and request printed after visit summary then discharged home to self care.

## 2023-06-17 NOTE — Unmapped (Signed)
Clinical Support on 06/17/2023   Component Date Value Ref Range Status    Sodium 06/17/2023 140  135 - 145 mmol/L Final    Potassium 06/17/2023 3.3 (L)  3.4 - 4.8 mmol/L Final    Chloride 06/17/2023 101  98 - 107 mmol/L Final    CO2 06/17/2023 27.2  20.0 - 31.0 mmol/L Final    Anion Gap 06/17/2023 12  5 - 14 mmol/L Final    BUN 06/17/2023 10  9 - 23 mg/dL Final    Creatinine 72/53/6644 0.67 (L)  0.73 - 1.18 mg/dL Final    BUN/Creatinine Ratio 06/17/2023 15   Final    eGFR CKD-EPI (2021) Male 06/17/2023 >90  >=60 mL/min/1.84m2 Final    eGFR calculated with CKD-EPI 2021 equation in accordance with [employer] and [employer] of Nephrology Task Force recommendations.    Glucose 06/17/2023 95  70 - 179 mg/dL Final    Calcium 03/47/4259 6.5 (L)  8.7 - 10.4 mg/dL Final    Albumin 56/38/7564 3.0 (L)  3.4 - 5.0 g/dL Final    Total Protein 06/17/2023 7.3  5.7 - 8.2 g/dL Final    Total Bilirubin 06/17/2023 0.6  0.3 - 1.2 mg/dL Final    AST 33/29/5188 17  <=34 U/L Final    ALT 06/17/2023 <7 (L)  10 - 49 U/L Final    Alkaline Phosphatase 06/17/2023 104  46 - 116 U/L Final    WBC 06/17/2023 7.0  3.6 - 11.2 10*9/L Final    RBC 06/17/2023 3.96 (L)  4.26 - 5.60 10*12/L Final    HGB 06/17/2023 10.9 (L)  12.9 - 16.5 g/dL Final    HCT 41/66/0630 32.7 (L)  39.0 - 48.0 % Final    MCV 06/17/2023 82.6  77.6 - 95.7 fL Final    MCH 06/17/2023 27.4  25.9 - 32.4 pg Final    MCHC 06/17/2023 33.2  32.0 - 36.0 g/dL Final    RDW 16/08/930 15.5 (H)  12.2 - 15.2 % Final    MPV 06/17/2023 7.6  6.8 - 10.7 fL Final    Platelet 06/17/2023 259  150 - 450 10*9/L Final    nRBC 06/17/2023 0  <=4 /100 WBCs Final    Neutrophils % 06/17/2023 75.7  % Final    Lymphocytes % 06/17/2023 17.4  % Final    Monocytes % 06/17/2023 4.3  % Final    Eosinophils % 06/17/2023 1.6  % Final    Basophils % 06/17/2023 1.0  % Final    Absolute Neutrophils 06/17/2023 5.3  1.8 - 7.8 10*9/L Final    Absolute Lymphocytes 06/17/2023 1.2  1.1 - 3.6 10*9/L Final Absolute Monocytes 06/17/2023 0.3  0.3 - 0.8 10*9/L Final    Absolute Eosinophils 06/17/2023 0.1  0.0 - 0.5 10*9/L Final    Absolute Basophils 06/17/2023 0.1  0.0 - 0.1 10*9/L Final

## 2023-06-18 NOTE — Unmapped (Signed)
I called Sergio Perez and someone picked up and said something but I couldn't understand what was said before the line clicked off. I tried calling back but there was no answer.

## 2023-06-18 NOTE — Unmapped (Signed)
No answer. Will [employer].

## 2023-06-21 DIAGNOSIS — C786 Secondary malignant neoplasm of retroperitoneum and peritoneum: Principal | ICD-10-CM

## 2023-06-21 DIAGNOSIS — C181 Malignant neoplasm of appendix: Principal | ICD-10-CM

## 2023-06-21 NOTE — Unmapped (Signed)
RN Navigator called pt's sister Jasmine December. States pt is not feeling well. Continues to c/o NV.    RN called pt. C/o continuous nausea/vomiting. Vomited x 1 today. Alternating zofran and compazine around the clock.    Sipping on Pedialyte and Gatorade. Had scrambled eggs today and vomited immediately after.     Reports 6 episodes of diarrhea last night. Describes as large amt of liquid stool. Having lower stomach pain.    Pt exhausted. Spending most of the day on the couch.     Pt scheduled for fluids Thursday afternoon.  Pt will review with team and follow up with pt.

## 2023-06-22 DIAGNOSIS — C181 Malignant neoplasm of appendix: Principal | ICD-10-CM

## 2023-06-22 DIAGNOSIS — C786 Secondary malignant neoplasm of retroperitoneum and peritoneum: Principal | ICD-10-CM

## 2023-06-22 NOTE — Unmapped (Signed)
RN Navigator contacted Jasmine December, pt's sister. Will plan to schedule pt for visit with Dr. Forbes Cellar at 9a followed by infusion and visit with Arlys John after. Jasmine December stated that pt unable to make it any earlier than 9am.    Pt continues to struggle with NVD. Continues to sip on Pedialyte and Gatorade.

## 2023-06-22 NOTE — Unmapped (Signed)
error 

## 2023-06-23 DIAGNOSIS — C181 Malignant neoplasm of appendix: Principal | ICD-10-CM

## 2023-06-23 DIAGNOSIS — C786 Secondary malignant neoplasm of retroperitoneum and peritoneum: Principal | ICD-10-CM

## 2023-06-23 NOTE — Unmapped (Unsigned)
[facility]     CONSULTING PROVIDERS  Dr. Thornton Papas, Medical Oncology, [facility]  Dr. 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 11/15/21: Right hemicolectomy: G3 poorly diff goblet cell adenocarcinoma with focal mucinous diff, 2.4cm through visceral peritoneum, 2/19 nodes, 5 tumor deposits, pT4aN1b.   [facility] NGS:   TP53 mutation; multiple VUS:  ATM, MTHFR, SH2B3, MSH2, SRP14, ATRX, LRP1B.   TMB 4.7, MSS  2. 12/31/21-06/2022 adjuvant capecitabine x 8 cycles (dose reductions and delay d/t grade 3 HFS)   - c1 complicated by HFS.   - c2 dose reduced to 2g BID, still with HFS but shorter and less severe  - c5 delayed, dose reduced to 1500mg  BID  3. 08/2022 recurrent appendiceal w/ peritoneal carcinomatosis  4. 08/20/22 -10/2022 FOLFOX (no bevacizumab given CVA 12/23). Best response PD.   -Cy 3 complicated by diarrhea, ileus, hospitalization. Non-contrasted scan without progression or frank obstruction (ileus)  - c4 5FU only during recovery  5. 10/2022- 5/ 2024 irinotecan 180 mg/m2 q14d. Best response PD.   6. 01/2023- 03/2023 panitumumab  7. 04/13/2023 progressive small bowel obstruction-   04/16/23 exlap, resection of ileocolic anastomosis, omentectomy, gtube placement: path= metasetatic carcinoma c/w poorly diff goblet cell adenocarcinoma with mucinous diff. Extensively involves mesenery and serosa of small bowel/ colon with extensive kinking/adherence of small bowel from tumor deposits, omentum with extensive involvement of carcinoma up to 15cm  Repeat [facility] xT: MSS, TMB 5.8; RAS/RAF WT, TP53 mutation. VUS: ATM, ARTX, FGFR3, etc. See reports.   8. 06/04/2023 lonsurf start (75 mg bid 5 on and  9 off of 14d cycles)  - held 11/12 after first 5 day course d/t nausea /vomiting  __________________________________________________________________    ASSESSMENT  1. 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 cancer pain increasing, well palliated on hydromorphone  3. Progressive nausea/vomiting from disease likely exacerbated by chemotherapy   4. Acute left CVA 07/2022 with residual weakness, s/p PCI right carotid   5.  ECOG PS 2    RECOMMENDATIONS  1. No further cancer directed therapy  2. Trial of dexamethasone for nausea/vomiting.     3. Hydromorphone 4 mg q4 prn          DISCUSSION       This was high risk medical decision making on the basis of advanced cancer and treatment with high risk for complication requiring ongoing careful monitoring for severe toxicity.     ______________________________________________________________________  HISTORY     Sergio Perez is seen today at the [facility] for ongoing management regarding appendiceal adenocarcinoma     Since last visit he has been doing poorly. Though off lonsurf  Nausea is   Intake  pain     MEDICAL HISTORY  1. Alcohol Use  2. Cerebellar Stroke (2017), no residual deficits, Acute left CVA 07/2022   3. CAD  4. CPPD  5. GERD  6. HTN  7. Hypothyroidism (acquired following ENT cancer treatment)  8. 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   9. Bilateral total knee replacement  10. Right side sciatica     No Known Allergies    Medications reviewed in the EMR    SOCIAL HISTORY  He lives alone in [place]. He works as  a welder/pipefitter. He quit smoking cigarettes in 2011. He reports quitting alcohol 1 week prior to hospital admission April 2023. He previously drank a sixpack of alcohol per day. No drug use.  Sister lives in [place].  Father lives on same land in another house. 2024 got full longterm disability    PHYSICAL EXAM  There were no vitals taken for this visit.   GENERAL: well developed, well nourished, in no distress  HEENT: NCAT, pupils equal, sclerae anicteric, PSYCH: full and appropriate range of affect with good insight and judgement  LYMPH: No cervical or supraclav adenopathy  LUNGS: clear bilaterally with good air excursion and no wheezing, rhonci, rales  COR: reg,  no murmurs, rubs, gallops   GI: abdomen not as soft as it was, I cannot feel frank masses but firmer, particularly just below the more superior aspect of incision, mild diffuse tenderness worse in RUQ, no obvious ascites, normoactive BS,  Midline incision healed  EXT: no edema  SKIN: No rashes     OBJECTIVE DATA  LABS  Labs reviewed in emr     RADIOLOGY RESULTS   None today

## 2023-06-24 DIAGNOSIS — C181 Malignant neoplasm of appendix: Principal | ICD-10-CM

## 2023-06-24 DIAGNOSIS — C786 Secondary malignant neoplasm of retroperitoneum and peritoneum: Principal | ICD-10-CM

## 2023-06-24 NOTE — Unmapped (Signed)
Sergio Perez contacted the [employer] requesting to speak with the care team of Sergio Perez to discuss:    Sergio Perez says that her brother is not feeling well, nausea and vomiting. He is unable to come to his appts today. Can appt with Dr. Forbes Cellar be switched to a telephone call?    Please contact Sergio Perez  at     [phone]   .    Thank you,   Yehuda Mao  [facility]   [phone]    [facility]_Oper

## 2023-06-25 DIAGNOSIS — C786 Secondary malignant neoplasm of retroperitoneum and peritoneum: Principal | ICD-10-CM

## 2023-06-25 DIAGNOSIS — C181 Malignant neoplasm of appendix: Principal | ICD-10-CM

## 2023-06-25 MED ORDER — DIPHENOXYLATE-ATROPINE 2.5 MG-0.025 MG TABLET
ORAL_TABLET | Freq: Four times a day (QID) | ORAL | 2 refills | 8 days | Status: CP | PRN
Start: 2023-06-25 — End: 2024-06-24

## 2023-06-25 NOTE — Unmapped (Signed)
I called [name] to follow-up on symptoms he reported including diarrhea and nausea. He is currently on his off week from his Lonsurf but may consider restarting if he recovers more.    Assessment/Discussion:    Diarrhea - patient reports diarrhea daily since starting Lonsurf. Has tried loperamide, but not controlled. New Rx sent to [facility] for Lomotil. May use up to four times daily for diarrhea.     2.   Fatigue - patient reports feeling more fatigued lately and spending much of the day lying down. Talking on the phone is tiring.     3.   Nausea - improved today. Has been an issue this week, but he feels the medicines he has work. We discussed taking them more frequently or around events that trigger nausea such as eating.    4.   Lonsurf - patient has their next doses of Lonsurf, but is waiting to see if he feels better before starting the next round of chemotherapy.     5.   Pain - He said he has increased abdominal pain at night. He has enough pain medicine and it does relieve the pain when he takes it.     The patient said the call was making him tired and he needed to lay down, but he expressed appreciation for the check-in.

## 2023-06-28 DIAGNOSIS — C181 Malignant neoplasm of appendix: Principal | ICD-10-CM

## 2023-06-28 DIAGNOSIS — C786 Secondary malignant neoplasm of retroperitoneum and peritoneum: Principal | ICD-10-CM

## 2023-06-28 NOTE — Unmapped (Signed)
Called to check on Ray:   He has now begun to feel better over the past 48 hrs. Still having diarrhea-- did not pick up the lomotil-- but the nausea and fatigue are improved. He's been able to get off the couch and be a little more active. Able to eat- yesterday grilled cheese and then later a bologna sandwich. Abd is swollen/bloated, wit a firm spot. But not the acute pain he had when obstructed.     So- seems likely that much of his acute decline was related to the lonsurf. Discussed that his wishes have been consistent in that he would like to take meds if available to help him prolong his life expectancy; however, as sick has he was from the lonsurf I'm worried it was actually making him sicker and might shorten his life expectancy. Ray agrees, felt like if he kept taking the chemo pills it might have killed him.     Given that, decided that we will not pursue more lonsurf. There are not any additional agents with expected benefit. Right now he is also not trial eligible d/t PS, but we can re-evaluate that as he (hopefully) recovers. Will bring him back for a visit in a couple of weeks to reassess.

## 2023-06-29 DIAGNOSIS — C181 Malignant neoplasm of appendix: Principal | ICD-10-CM

## 2023-06-29 DIAGNOSIS — C786 Secondary malignant neoplasm of retroperitoneum and peritoneum: Principal | ICD-10-CM

## 2023-06-29 NOTE — Unmapped (Signed)
I called the pt to reschedule his appts, he said to do it and he will see them in [place]

## 2023-07-04 ENCOUNTER — Emergency Department: Payer: BC Managed Care – PPO

## 2023-07-04 ENCOUNTER — Observation Stay
Admission: EM | Admit: 2023-07-04 | Discharge: 2023-07-06 | Disposition: A | Discharge status: Home / Self Care | Payer: BC Managed Care – PPO | Attending: Internal Medicine | Admitting: Internal Medicine

## 2023-07-04 ENCOUNTER — Other Ambulatory Visit: Payer: Self-pay

## 2023-07-04 DIAGNOSIS — C786 Secondary malignant neoplasm of retroperitoneum and peritoneum: Principal | ICD-10-CM

## 2023-07-04 DIAGNOSIS — C181 Malignant neoplasm of appendix: Principal | ICD-10-CM

## 2023-07-04 DIAGNOSIS — Z8673 Personal history of transient ischemic attack (TIA), and cerebral infarction without residual deficits: Secondary | ICD-10-CM

## 2023-07-04 DIAGNOSIS — E785 Hyperlipidemia, unspecified: Secondary | ICD-10-CM

## 2023-07-04 DIAGNOSIS — E039 Hypothyroidism, unspecified: Secondary | ICD-10-CM | POA: Diagnosis not present

## 2023-07-04 DIAGNOSIS — I5032 Chronic diastolic (congestive) heart failure: Secondary | ICD-10-CM

## 2023-07-04 DIAGNOSIS — R1084 Generalized abdominal pain: Principal | ICD-10-CM

## 2023-07-04 DIAGNOSIS — J449 Chronic obstructive pulmonary disease, unspecified: Secondary | ICD-10-CM | POA: Insufficient documentation

## 2023-07-04 DIAGNOSIS — E876 Hypokalemia: Secondary | ICD-10-CM

## 2023-07-04 DIAGNOSIS — R109 Unspecified abdominal pain: Secondary | ICD-10-CM | POA: Diagnosis not present

## 2023-07-04 DIAGNOSIS — R112 Nausea with vomiting, unspecified: Secondary | ICD-10-CM | POA: Diagnosis not present

## 2023-07-04 DIAGNOSIS — E878 Other disorders of electrolyte and fluid balance, not elsewhere classified: Secondary | ICD-10-CM

## 2023-07-04 DIAGNOSIS — F1010 Alcohol abuse, uncomplicated: Secondary | ICD-10-CM | POA: Diagnosis present

## 2023-07-04 DIAGNOSIS — I10 Essential (primary) hypertension: Secondary | ICD-10-CM

## 2023-07-04 DIAGNOSIS — C189 Malignant neoplasm of colon, unspecified: Secondary | ICD-10-CM | POA: Insufficient documentation

## 2023-07-04 DIAGNOSIS — K769 Liver disease, unspecified: Secondary | ICD-10-CM | POA: Diagnosis not present

## 2023-07-04 DIAGNOSIS — I110 Hypertensive heart disease with heart failure: Secondary | ICD-10-CM | POA: Insufficient documentation

## 2023-07-04 DIAGNOSIS — Z789 Other specified health status: Secondary | ICD-10-CM

## 2023-07-04 DIAGNOSIS — I5030 Unspecified diastolic (congestive) heart failure: Secondary | ICD-10-CM | POA: Insufficient documentation

## 2023-07-04 DIAGNOSIS — R197 Diarrhea, unspecified: Secondary | ICD-10-CM | POA: Insufficient documentation

## 2023-07-04 DIAGNOSIS — R188 Other ascites: Secondary | ICD-10-CM | POA: Diagnosis not present

## 2023-07-04 DIAGNOSIS — J42 Unspecified chronic bronchitis: Secondary | ICD-10-CM

## 2023-07-04 DIAGNOSIS — F1011 Alcohol abuse, in remission: Secondary | ICD-10-CM | POA: Diagnosis present

## 2023-07-04 DIAGNOSIS — F418 Other specified anxiety disorders: Secondary | ICD-10-CM

## 2023-07-04 DIAGNOSIS — E871 Hypo-osmolality and hyponatremia: Secondary | ICD-10-CM | POA: Insufficient documentation

## 2023-07-04 LAB — LACTATE DEHYDROGENASE: LDH: 167 U/L (ref 98–192)

## 2023-07-04 LAB — CBC
HCT: 34.6 % — ABNORMAL LOW (ref 39.0–52.0)
Hemoglobin: 11.2 g/dL — ABNORMAL LOW (ref 13.0–17.0)
MCH: 27 pg (ref 26.0–34.0)
MCHC: 32.4 g/dL (ref 30.0–36.0)
MCV: 83.4 fL (ref 80.0–100.0)
Platelets: 333 10*3/uL (ref 150–400)
RBC: 4.15 MIL/uL — ABNORMAL LOW (ref 4.22–5.81)
RDW: 16 % — ABNORMAL HIGH (ref 11.5–15.5)
WBC: 7.4 10*3/uL (ref 4.0–10.5)
nRBC: 0 % (ref 0.0–0.2)

## 2023-07-04 LAB — COMPREHENSIVE METABOLIC PANEL
ALT: 8 U/L (ref 0–44)
AST: 22 U/L (ref 15–41)
Albumin: 3.2 g/dL — ABNORMAL LOW (ref 3.5–5.0)
Alkaline Phosphatase: 75 U/L (ref 38–126)
Anion gap: 14 (ref 5–15)
BUN: 13 mg/dL (ref 8–23)
CO2: 27 mmol/L (ref 22–32)
Calcium: 5.7 mg/dL — CL (ref 8.9–10.3)
Chloride: 95 mmol/L — ABNORMAL LOW (ref 98–111)
Creatinine, Ser: 0.73 mg/dL (ref 0.61–1.24)
GFR, Estimated: >60 mL/min (ref >60)
Glucose, Bld: 103 mg/dL — ABNORMAL HIGH (ref 70–99)
Potassium: 2.7 mmol/L — CL (ref 3.5–5.1)
Sodium: 136 mmol/L (ref 135–145)
Total Bilirubin: 0.6 mg/dL (ref <1.2)
Total Protein: 7.6 g/dL (ref 6.5–8.1)

## 2023-07-04 LAB — PHOSPHORUS: Phosphorus: 3.5 mg/dL (ref 2.5–4.6)

## 2023-07-04 LAB — MAGNESIUM: Magnesium: 0.5 mg/dL — CL (ref 1.7–2.4)

## 2023-07-04 LAB — BRAIN NATRIURETIC PEPTIDE: B Natriuretic Peptide: 52.3 pg/mL (ref 0.0–100.0)

## 2023-07-04 LAB — PROTIME-INR
INR: 2 — ABNORMAL HIGH (ref 0.8–1.2)
Prothrombin Time: 22.8 s — ABNORMAL HIGH (ref 11.4–15.2)

## 2023-07-04 LAB — APTT: aPTT: 71 s — ABNORMAL HIGH (ref 24–36)

## 2023-07-04 LAB — LIPASE, BLOOD: Lipase: 25 U/L (ref 11–51)

## 2023-07-04 MED ORDER — FENOFIBRATE 160 MG PO TABS
160.0000 mg | ORAL_TABLET | Freq: Every day | ORAL | Status: DC
  Administered 2023-07-05 – 2023-07-06 (×2): 160 mg via ORAL
  Filled 2023-07-04 (×2): qty 1

## 2023-07-04 MED ORDER — ACETAMINOPHEN 325 MG PO TABS
650.0000 mg | ORAL_TABLET | Freq: Four times a day (QID) | ORAL | Status: DC | PRN
  Administered 2023-07-06: 650 mg via ORAL
  Filled 2023-07-04: qty 2

## 2023-07-04 MED ORDER — ONDANSETRON HCL 4 MG/2ML IJ SOLN
4.0000 mg | Freq: Once | INTRAMUSCULAR | Status: AC
  Administered 2023-07-04: 4 mg via INTRAVENOUS
  Filled 2023-07-04: qty 2

## 2023-07-04 MED ORDER — HYDRALAZINE HCL 20 MG/ML IJ SOLN: 5.0000 mg | INTRAMUSCULAR | Status: DC | PRN

## 2023-07-04 MED ORDER — MORPHINE SULFATE (PF) 4 MG/ML IV SOLN
4.0000 mg | Freq: Once | INTRAVENOUS | Status: AC
  Administered 2023-07-04: 4 mg via INTRAVENOUS
  Filled 2023-07-04: qty 1

## 2023-07-04 MED ORDER — POTASSIUM CHLORIDE 10 MEQ/100ML IV SOLN
10.0000 meq | Freq: Once | INTRAVENOUS | Status: AC
  Administered 2023-07-04: 10 meq via INTRAVENOUS
  Filled 2023-07-04: qty 100

## 2023-07-04 MED ORDER — ATORVASTATIN CALCIUM 20 MG PO TABS
40.0000 mg | ORAL_TABLET | Freq: Every day | ORAL | Status: DC
  Administered 2023-07-05 – 2023-07-06 (×2): 40 mg via ORAL
  Filled 2023-07-04 (×2): qty 2

## 2023-07-04 MED ORDER — CALCIUM CARBONATE 1250 (500 CA) MG PO TABS
1250.0000 mg | ORAL_TABLET | Freq: Three times a day (TID) | ORAL | Status: DC
  Administered 2023-07-05 – 2023-07-06 (×5): 1250 mg via ORAL
  Filled 2023-07-04 (×5): qty 1

## 2023-07-04 MED ORDER — MAGNESIUM SULFATE 4 GM/100ML IV SOLN
4.0000 g | Freq: Once | INTRAVENOUS | Status: AC
  Administered 2023-07-04: 4 g via INTRAVENOUS
  Filled 2023-07-04: qty 100

## 2023-07-04 MED ORDER — ALBUMIN HUMAN 25 % IV SOLN
12.5000 g | Freq: Once | INTRAVENOUS | Status: AC
  Administered 2023-07-05: 12.5 g via INTRAVENOUS
  Filled 2023-07-04: qty 50

## 2023-07-04 MED ORDER — SODIUM CHLORIDE 0.9 % IV SOLN: Freq: Once | INTRAVENOUS | Status: AC

## 2023-07-04 MED ORDER — OXYCODONE-ACETAMINOPHEN 5-325 MG PO TABS: 1.0000 | ORAL_TABLET | ORAL | Status: DC | PRN

## 2023-07-04 MED ORDER — CALCIUM GLUCONATE-NACL 1-0.675 GM/50ML-% IV SOLN
1.0000 g | Freq: Once | INTRAVENOUS | Status: AC
  Administered 2023-07-04: 1000 mg via INTRAVENOUS
  Filled 2023-07-04: qty 50

## 2023-07-04 MED ORDER — MAGNESIUM OXIDE -MG SUPPLEMENT 400 (240 MG) MG PO TABS
400.0000 mg | ORAL_TABLET | Freq: Two times a day (BID) | ORAL | Status: DC
  Administered 2023-07-04 – 2023-07-06 (×4): 400 mg via ORAL
  Filled 2023-07-04 (×4): qty 1

## 2023-07-04 MED ORDER — DM-GUAIFENESIN ER 30-600 MG PO TB12: 1.0000 | ORAL_TABLET | Freq: Two times a day (BID) | ORAL | Status: DC | PRN

## 2023-07-04 MED ORDER — POTASSIUM CHLORIDE 10 MEQ/100ML IV SOLN
10.0000 meq | Freq: Once | INTRAVENOUS | Status: AC
Start: 2023-07-04 — End: 2023-07-04
  Administered 2023-07-04: 10 meq via INTRAVENOUS
  Filled 2023-07-04: qty 100

## 2023-07-04 MED ORDER — HEPARIN SODIUM (PORCINE) 5000 UNIT/ML IJ SOLN
5000.0000 [IU] | Freq: Three times a day (TID) | INTRAMUSCULAR | Status: DC
  Administered 2023-07-04 – 2023-07-06 (×5): 5000 [IU] via SUBCUTANEOUS
  Filled 2023-07-04 (×5): qty 1

## 2023-07-04 MED ORDER — METHOCARBAMOL 500 MG PO TABS
1000.0000 mg | ORAL_TABLET | Freq: Two times a day (BID) | ORAL | Status: DC
  Administered 2023-07-04 – 2023-07-06 (×4): 1000 mg via ORAL
  Filled 2023-07-04 (×4): qty 2

## 2023-07-04 MED ORDER — ALBUTEROL (5 MG/ML) CONTINUOUS INHALATION SOLN: 2.5000 mg | INHALATION_SOLUTION | RESPIRATORY_TRACT | Status: DC | PRN

## 2023-07-04 MED ORDER — CLOPIDOGREL BISULFATE 75 MG PO TABS
75.0000 mg | ORAL_TABLET | Freq: Every day | ORAL | Status: DC
  Administered 2023-07-05 – 2023-07-06 (×2): 75 mg via ORAL
  Filled 2023-07-04 (×2): qty 1

## 2023-07-04 MED ORDER — GABAPENTIN 300 MG PO CAPS
1200.0000 mg | ORAL_CAPSULE | Freq: Every day | ORAL | Status: DC
  Administered 2023-07-04 – 2023-07-05 (×2): 1200 mg via ORAL
  Filled 2023-07-04 (×2): qty 4

## 2023-07-04 MED ORDER — GABAPENTIN 400 MG PO CAPS
400.0000 mg | ORAL_CAPSULE | Freq: Two times a day (BID) | ORAL | Status: DC
  Administered 2023-07-05 – 2023-07-06 (×3): 400 mg via ORAL
  Filled 2023-07-04 (×3): qty 1
  Filled 2023-07-04 (×2): qty 4

## 2023-07-04 MED ORDER — ASPIRIN 81 MG PO TBEC
81.0000 mg | DELAYED_RELEASE_TABLET | Freq: Every day | ORAL | Status: DC
  Administered 2023-07-05 – 2023-07-06 (×2): 81 mg via ORAL
  Filled 2023-07-04 (×2): qty 1

## 2023-07-04 MED ORDER — IOHEXOL 300 MG/ML  SOLN
100.0000 mL | Freq: Once | INTRAMUSCULAR | Status: AC | PRN
  Administered 2023-07-04: 100 mL via INTRAVENOUS

## 2023-07-04 MED ORDER — POTASSIUM CHLORIDE CRYS ER 20 MEQ PO TBCR
40.0000 meq | EXTENDED_RELEASE_TABLET | Freq: Once | ORAL | Status: AC
  Administered 2023-07-04: 40 meq via ORAL
  Filled 2023-07-04: qty 2

## 2023-07-04 MED ORDER — LEVOTHYROXINE SODIUM 50 MCG PO TABS
150.0000 ug | ORAL_TABLET | Freq: Every day | ORAL | Status: DC
  Administered 2023-07-05 – 2023-07-06 (×2): 150 ug via ORAL
  Filled 2023-07-04: qty 1
  Filled 2023-07-04: qty 3

## 2023-07-04 MED ORDER — FENTANYL CITRATE PF 50 MCG/ML IJ SOSY
25.0000 ug | PREFILLED_SYRINGE | INTRAMUSCULAR | Status: DC | PRN
  Administered 2023-07-04 – 2023-07-05 (×4): 25 ug via INTRAVENOUS
  Filled 2023-07-04 (×4): qty 1

## 2023-07-04 MED ORDER — PANTOPRAZOLE SODIUM 40 MG PO TBEC
40.0000 mg | DELAYED_RELEASE_TABLET | Freq: Every day | ORAL | Status: DC
  Administered 2023-07-05 – 2023-07-06 (×2): 40 mg via ORAL
  Filled 2023-07-04 (×2): qty 1

## 2023-07-04 MED ORDER — HYDROMORPHONE HCL 2 MG PO TABS
4.0000 mg | ORAL_TABLET | ORAL | Status: DC | PRN
  Administered 2023-07-05 (×2): 4 mg via ORAL
  Filled 2023-07-04 (×3): qty 2

## 2023-07-04 MED ORDER — ONDANSETRON HCL 4 MG/2ML IJ SOLN: 4.0000 mg | Freq: Three times a day (TID) | INTRAMUSCULAR | Status: DC | PRN

## 2023-07-04 MED ORDER — METOPROLOL TARTRATE 50 MG PO TABS
100.0000 mg | ORAL_TABLET | Freq: Two times a day (BID) | ORAL | Status: DC
  Administered 2023-07-04 – 2023-07-05 (×3): 100 mg via ORAL
  Filled 2023-07-04 (×3): qty 2

## 2023-07-04 MED ORDER — AMITRIPTYLINE HCL 25 MG PO TABS
100.0000 mg | ORAL_TABLET | Freq: Every day | ORAL | Status: DC
  Administered 2023-07-04 – 2023-07-05 (×2): 100 mg via ORAL
  Filled 2023-07-04: qty 4
  Filled 2023-07-04: qty 2

## 2023-07-04 MED ORDER — DIPHENHYDRAMINE HCL 50 MG/ML IJ SOLN: 12.5000 mg | Freq: Three times a day (TID) | INTRAMUSCULAR | Status: DC | PRN

## 2023-07-04 MED ORDER — LACTATED RINGERS IV BOLUS
1000.0000 mL | Freq: Once | INTRAVENOUS | Status: AC
  Administered 2023-07-04: 1000 mL via INTRAVENOUS

## 2023-07-04 MED ORDER — AMLODIPINE BESYLATE 10 MG PO TABS
10.0000 mg | ORAL_TABLET | Freq: Every day | ORAL | Status: DC
  Administered 2023-07-05: 10 mg via ORAL
  Filled 2023-07-04: qty 2

## 2023-07-04 MED ORDER — TAMSULOSIN HCL 0.4 MG PO CAPS
0.4000 mg | ORAL_CAPSULE | Freq: Every day | ORAL | Status: DC
  Administered 2023-07-05 – 2023-07-06 (×2): 0.4 mg via ORAL
  Filled 2023-07-04 (×2): qty 1

## 2023-07-04 MED ORDER — HYDROMORPHONE 4 MG TABLET
ORAL_TABLET | ORAL | 0 refills | 15 days | PRN
Start: 2023-07-04 — End: ?

## 2023-07-04 NOTE — ED Triage Notes (Signed)
Pt to ED via POV from home. Pt reports hx of colon cancer with mets. Pt reports lower abd pain x2 days. Pt is on chemo. +N/V/D

## 2023-07-04 NOTE — Progress Notes (Signed)
PHARMACY CONSULT NOTE - ELECTROLYTES  Pharmacy Consult for Electrolyte Monitoring and Replacement   Recent Labs:   CrCl cannot be calculated (Unknown ideal weight.). Potassium (mmol/L)  Date Value  07/04/2023 2.7 (LL)  03/11/2014 4.0   Magnesium (mg/dL)  Date Value  45/40/9811 0.5 (LL)   Calcium (mg/dL)  Date Value  91/47/8295 5.7 (LL)   Calcium, Total (mg/dL)  Date Value  62/13/0865 8.6   Albumin (g/dL)  Date Value  78/46/9629 3.2 (L)   Phosphorus (mg/dL)  Date Value  52/84/1324 3.5   Sodium (mmol/L)  Date Value  07/04/2023 136  03/11/2014 137   Corrected Ca: 6.3 mg/dL  Assessment  Patrick Marcia. is a 62 y.o. male presenting with lower abdominal pain associated with nausea, vomiting, and diarrhea. PMH significant for colon cancer with metastatic disease currently on chemotherapy. Pharmacy has been consulted to monitor and replace electrolytes.  Diet: PO MIVF: None Pertinent medications: None  Goal of Therapy: Electrolytes WNL  Plan:  MD ordered the following: Calcium carbonate 1,250 mg 3 times daily with meals Calcium gluconate 1 g IV x 1 Magnesium 4 g IV x 1 Potassium 40 mEq PO x 1 Potassium 10 mEq IV x 1 Will order an additional per protocol: Magnesium 4.0 g IV x 1 Potassium 10 mEq IV x 1 Check BMP, Mg, Phos with AM labs  Thank you for allowing pharmacy to be a part of this patient's care.  Merryl Hacker, PharmD Clinical Pharmacist 07/04/2023 7:25 PM

## 2023-07-04 NOTE — ED Notes (Addendum)
Attempted to catch urine sample. Patient explained they could not provide urine sample at this time. UA cup is at bedside.

## 2023-07-04 NOTE — ED Notes (Signed)
Pt given ice cream
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
## 2023-07-04 NOTE — ED Notes (Signed)
Patient continues to state "I cannot pee or urinate right now."

## 2023-07-04 NOTE — ED Provider Notes (Signed)
[facility] Provider Note    Event Date/Time   First MD Initiated Contact with Patient 07/04/23 1301     (approximate)   History   Abdominal Pain   HPI Patrick Bailey. is a 62 y.o. male with history of colon cancer with metastatic disease currently on chemotherapy presenting today for lower abdominal pain associated with nausea, vomiting, and diarrhea.  Patient states symptoms have been ongoing for the past week with pain most around the bellybutton.  Has not been able to eat and drink hardly anything in the past couple of days.  Denies fever, chills, chest pain, shortness of breath, constipation, dysuria, melanotic stools, blood in his stools.  Last chemotherapy treatment was 16 days ago.  Chart review: Revealed most recent radiology visit note for peritoneal carcinomatosis.     Physical Exam   Triage Vital Signs: ED Triage Vitals  Encounter Vitals Group     BP 07/04/23 1302 (!) 176/105     Systolic BP Percentile --      Diastolic BP Percentile --      Pulse Rate 07/04/23 1302 (!) 115     Resp 07/04/23 1302 20     Temp 07/04/23 1302 98 F (36.7 C)     Temp Source 07/04/23 1302 Oral     SpO2 07/04/23 1302 96 %     Weight --      Height --      Head Circumference --      Peak Flow --      Pain Score 07/04/23 1300 9     Pain Loc --      Pain Education --      Exclude from Growth Chart --     Most recent vital signs: Vitals:   07/04/23 1302 07/04/23 1400  BP: (!) 176/105   Pulse: (!) 115 (!) 104  Resp: 20   Temp: 98 F (36.7 C)   SpO2: 96%    Physical Exam: I have reviewed the vital signs and nursing notes. General: Awake, alert, no acute distress.  Nontoxic appearing. Head:  Atraumatic, normocephalic.   ENT:  EOM intact, PERRL. Oral mucosa is pink and moist with no lesions. Neck: Neck is supple with full range of motion, No meningeal signs. Cardiovascular: Tachycardia, RR, No murmurs. Peripheral pulses palpable and equal  bilaterally. Respiratory:  Symmetrical chest wall expansion.  No rhonchi, rales, or wheezes.  Good air movement throughout.  No use of accessory muscles.   Musculoskeletal:  No cyanosis or edema. Moving extremities with full ROM Abdomen:  Soft, nontender, nondistended. Neuro:  GCS 15, moving all four extremities, interacting appropriately. Speech clear. Psych:  Calm, appropriate.   Skin:  Warm, dry, no rash.    ED Results / Procedures / Treatments   Labs (all labs ordered are listed, but only abnormal results are displayed) Labs Reviewed  COMPREHENSIVE METABOLIC PANEL - Abnormal; Notable for the following components:      Result Value   Potassium 2.7 (*)    Chloride 95 (*)    Glucose, Bld 103 (*)    Calcium 5.7 (*)    Albumin 3.2 (*)    All other components within normal limits  CBC - Abnormal; Notable for the following components:   RBC 4.15 (*)    Hemoglobin 11.2 (*)    HCT 34.6 (*)    RDW 16.0 (*)    All other components within normal limits  LIPASE, BLOOD  URINALYSIS, ROUTINE W REFLEX MICROSCOPIC  EKG    RADIOLOGY CT pending at time of signout   PROCEDURES:  Critical Care performed: No  Procedures   MEDICATIONS ORDERED IN ED: Medications  potassium chloride 10 mEq in 100 mL IVPB (10 mEq Intravenous New Bag/Given 07/04/23 1500)  calcium gluconate 1 g/ 50 mL sodium chloride IVPB (1,000 mg Intravenous New Bag/Given 07/04/23 1502)  iohexol (OMNIPAQUE) 300 MG/ML solution 100 mL (has no administration in time range)  morphine (PF) 4 MG/ML injection 4 mg (4 mg Intravenous Given 07/04/23 1330)  ondansetron (ZOFRAN) injection 4 mg (4 mg Intravenous Given 07/04/23 1329)  lactated ringers bolus 1,000 mL (0 mLs Intravenous Stopped 07/04/23 1455)  0.9 %  sodium chloride infusion ( Intravenous New Bag/Given 07/04/23 1502)     IMPRESSION / MDM / ASSESSMENT AND PLAN / ED COURSE  I reviewed the triage vital signs and the nursing notes.                               Differential diagnosis includes, but is not limited to, SBO, diverticulitis, peritonitis, appendicitis, gastroenteritis, dehydration  Patient's presentation is most consistent with acute presentation with potential threat to life or bodily function.  Patient is a 62 year old male with colon cancer with no metastatic disease and peritoneal carcinomatosis patient is a 62 year old male with colon cancer with no metastatic disease and peritoneal carcinomatosis presenting today for severe abdominal pain over the past week associated with nausea, vomiting, and diarrhea.  On exam he is tachycardic and tender throughout the abdomen.  Will get laboratory workup and evaluate with CT for intra-abdominal pathology such as infection or obstruction.  Patient given 1 L fluids, morphine, and Zofran for symptomatic treatment.  Laboratory workup notable for hypokalemia and hypocalcemia for which patient was given IV repletion.  Patient was signed out pending results of CT abdomen/pelvis.  The patient is on the cardiac monitor to evaluate for evidence of arrhythmia and/or significant heart rate changes.     FINAL CLINICAL IMPRESSION(S) / ED DIAGNOSES   Final diagnoses:  Generalized abdominal pain  Nausea vomiting and diarrhea  Colon cancer metastasized to multiple sites [facility])  Hypokalemia  Hypocalcemia     Rx / DC Orders   ED Discharge Orders     None        Note:  This document was prepared using Dragon voice recognition software and may include unintentional dictation errors.   Janith Lima, MD 07/04/23 [phone]

## 2023-07-05 ENCOUNTER — Encounter: Payer: Self-pay | Admitting: Internal Medicine

## 2023-07-05 ENCOUNTER — Inpatient Hospital Stay: Payer: BC Managed Care – PPO

## 2023-07-05 DIAGNOSIS — C786 Secondary malignant neoplasm of retroperitoneum and peritoneum: Principal | ICD-10-CM

## 2023-07-05 DIAGNOSIS — C181 Malignant neoplasm of appendix: Principal | ICD-10-CM

## 2023-07-05 DIAGNOSIS — C482 Malignant neoplasm of peritoneum, unspecified: Secondary | ICD-10-CM | POA: Diagnosis not present

## 2023-07-05 DIAGNOSIS — R109 Unspecified abdominal pain: Secondary | ICD-10-CM | POA: Diagnosis not present

## 2023-07-05 DIAGNOSIS — R112 Nausea with vomiting, unspecified: Secondary | ICD-10-CM

## 2023-07-05 DIAGNOSIS — E871 Hypo-osmolality and hyponatremia: Secondary | ICD-10-CM | POA: Insufficient documentation

## 2023-07-05 DIAGNOSIS — R197 Diarrhea, unspecified: Secondary | ICD-10-CM

## 2023-07-05 DIAGNOSIS — K529 Noninfective gastroenteritis and colitis, unspecified: Secondary | ICD-10-CM | POA: Diagnosis not present

## 2023-07-05 DIAGNOSIS — R188 Other ascites: Secondary | ICD-10-CM | POA: Diagnosis not present

## 2023-07-05 LAB — ALBUMIN, PLEURAL OR PERITONEAL FLUID: Albumin, Fluid: 2.6 g/dL

## 2023-07-05 LAB — CBC
HCT: 32 % — ABNORMAL LOW (ref 39.0–52.0)
Hemoglobin: 10.3 g/dL — ABNORMAL LOW (ref 13.0–17.0)
MCH: 26.5 pg (ref 26.0–34.0)
MCHC: 32.2 g/dL (ref 30.0–36.0)
MCV: 82.3 fL (ref 80.0–100.0)
Platelets: 313 10*3/uL (ref 150–400)
RBC: 3.89 MIL/uL — ABNORMAL LOW (ref 4.22–5.81)
RDW: 16 % — ABNORMAL HIGH (ref 11.5–15.5)
WBC: 7.1 10*3/uL (ref 4.0–10.5)
nRBC: 0 % (ref 0.0–0.2)

## 2023-07-05 LAB — BASIC METABOLIC PANEL
Anion gap: 11 (ref 5–15)
BUN: 7 mg/dL — ABNORMAL LOW (ref 8–23)
CO2: 27 mmol/L (ref 22–32)
Calcium: 6.1 mg/dL — CL (ref 8.9–10.3)
Chloride: 96 mmol/L — ABNORMAL LOW (ref 98–111)
Creatinine, Ser: 0.78 mg/dL (ref 0.61–1.24)
GFR, Estimated: >60 mL/min (ref >60)
Glucose, Bld: 108 mg/dL — ABNORMAL HIGH (ref 70–99)
Potassium: 2.8 mmol/L — ABNORMAL LOW (ref 3.5–5.1)
Sodium: 134 mmol/L — ABNORMAL LOW (ref 135–145)

## 2023-07-05 LAB — BODY FLUID CELL COUNT WITH DIFFERENTIAL
Eos, Fluid: 0 %
Lymphs, Fluid: 40 %
Monocyte-Macrophage-Serous Fluid: 56 %
Neutrophil Count, Fluid: 4 %
Total Nucleated Cell Count, Fluid: 403 uL

## 2023-07-05 LAB — URINALYSIS, ROUTINE W REFLEX MICROSCOPIC
Bilirubin Urine: NEGATIVE
Glucose, UA: NEGATIVE mg/dL
Hgb urine dipstick: NEGATIVE
Ketones, ur: NEGATIVE mg/dL
Leukocytes,Ua: NEGATIVE
Nitrite: NEGATIVE
Protein, ur: NEGATIVE mg/dL
Specific Gravity, Urine: 1.044 — ABNORMAL HIGH (ref 1.005–1.030)
pH: 5 (ref 5.0–8.0)

## 2023-07-05 LAB — LACTATE DEHYDROGENASE, PLEURAL OR PERITONEAL FLUID: LD, Fluid: 327 U/L — ABNORMAL HIGH (ref 3–23)

## 2023-07-05 LAB — POTASSIUM: Potassium: 3.3 mmol/L — ABNORMAL LOW (ref 3.5–5.1)

## 2023-07-05 LAB — GLUCOSE, PLEURAL OR PERITONEAL FLUID: Glucose, Fluid: 89 mg/dL

## 2023-07-05 LAB — HIV ANTIBODY (ROUTINE TESTING W REFLEX): HIV Screen 4th Generation wRfx: NONREACTIVE

## 2023-07-05 LAB — PROTEIN, PLEURAL OR PERITONEAL FLUID: Total protein, fluid: 5 g/dL

## 2023-07-05 LAB — PHOSPHORUS: Phosphorus: 3.6 mg/dL (ref 2.5–4.6)

## 2023-07-05 LAB — MAGNESIUM: Magnesium: 2.2 mg/dL (ref 1.7–2.4)

## 2023-07-05 MED ORDER — POTASSIUM CHLORIDE CRYS ER 20 MEQ PO TBCR
40.0000 meq | EXTENDED_RELEASE_TABLET | Freq: Once | ORAL | Status: AC
  Administered 2023-07-05: 40 meq via ORAL
  Filled 2023-07-05: qty 2

## 2023-07-05 MED ORDER — POTASSIUM CHLORIDE 10 MEQ/100ML IV SOLN
10.0000 meq | INTRAVENOUS | Status: AC
  Administered 2023-07-05 (×2): 10 meq via INTRAVENOUS
  Filled 2023-07-05 (×2): qty 100

## 2023-07-05 MED ORDER — LIDOCAINE HCL (PF) 1 % IJ SOLN
10.0000 mL | Freq: Once | INTRAMUSCULAR | Status: AC
  Administered 2023-07-05: 10 mL via INTRADERMAL

## 2023-07-05 MED ORDER — POTASSIUM CHLORIDE 10 MEQ/100ML IV SOLN: 10.0000 meq | INTRAVENOUS | Status: DC

## 2023-07-05 MED ORDER — ONDANSETRON HCL 4 MG/2ML IJ SOLN
4.0000 mg | Freq: Four times a day (QID) | INTRAMUSCULAR | Status: DC | PRN
  Administered 2023-07-05 – 2023-07-06 (×2): 4 mg via INTRAVENOUS
  Filled 2023-07-05 (×2): qty 2

## 2023-07-05 MED ORDER — HYDROMORPHONE HCL 1 MG/ML IJ SOLN
1.0000 mg | INTRAMUSCULAR | Status: DC | PRN
  Administered 2023-07-05 – 2023-07-06 (×3): 1 mg via INTRAVENOUS
  Filled 2023-07-05 (×3): qty 1

## 2023-07-05 MED ORDER — ENSURE ENLIVE PO LIQD
237.0000 mL | Freq: Two times a day (BID) | ORAL | Status: DC
  Administered 2023-07-06: 237 mL via ORAL

## 2023-07-05 MED ORDER — FOLIC ACID 1 MG PO TABS
1.0000 mg | ORAL_TABLET | Freq: Every day | ORAL | Status: DC
  Administered 2023-07-05 – 2023-07-06 (×2): 1 mg via ORAL
  Filled 2023-07-05 (×2): qty 1

## 2023-07-05 MED ORDER — THIAMINE MONONITRATE 100 MG PO TABS
100.0000 mg | ORAL_TABLET | Freq: Every day | ORAL | Status: DC
  Administered 2023-07-05 – 2023-07-06 (×2): 100 mg via ORAL
  Filled 2023-07-05 (×2): qty 1

## 2023-07-05 NOTE — Unmapped (Signed)
RN Navigator contacted pt. Pt currently hospitalized at [facility] for fluids and pain control. Pt planning to discharge prior to Thursday and will be at provider visit . Pt will call if plans change.

## 2023-07-05 NOTE — Progress Notes (Addendum)
PHARMACY CONSULT NOTE - ELECTROLYTES  Pharmacy Consult for Electrolyte Monitoring and Replacement   Recent Labs:   CrCl cannot be calculated (Unknown ideal weight.). Potassium (mmol/L)  Date Value  07/05/2023 2.8 (L)  03/11/2014 4.0   Magnesium (mg/dL)  Date Value  16/05/9603 2.2   Calcium (mg/dL)  Date Value  54/04/8118 6.1 (LL)   Calcium, Total (mg/dL)  Date Value  14/78/2956 8.6   Albumin (g/dL)  Date Value  21/30/8657 3.2 (L)   Phosphorus (mg/dL)  Date Value  84/69/6295 3.6   Sodium (mmol/L)  Date Value  07/05/2023 134 (L)  03/11/2014 137   Corrected Ca: 6.74 mg/dL  Assessment  Patrick Jupiter. is a 62 y.o. male presenting with lower abdominal pain associated with nausea, vomiting, and diarrhea. PMH significant for colon cancer with metastatic disease currently on chemotherapy. Pharmacy has been consulted to monitor and replace electrolytes.  Diet: PO MIVF: None Pertinent medications: None  Goal of Therapy: Electrolytes WNL  Plan:  MD has ordered the following Calcium carbonate 1,250 mg 3 times daily with meals Magnesium oxide 400 mg PO BID Potassium 10 mEq IV x 4 Potassium 40 mEq PO x 2  Check BMP, Mg, Phos with AM labs  Addendum: K+ 1600: 3.3 - will order an additional KCl 40 mEq PO x 1 and f/u with all electrolytes with AM labs  Thank you for allowing pharmacy to be a part of this patient's care.  Littie Deeds, PharmD Pharmacy Resident  07/05/2023 11:16 AM

## 2023-07-05 NOTE — ED Notes (Signed)
Pt given pillow and blankets.

## 2023-07-05 NOTE — ED Notes (Signed)
This tech and RN successfully escorted pt back to bed. Pt is resting in bed, call bell in reach, bed in lowest locked position.

## 2023-07-05 NOTE — ED Notes (Signed)
Patient transported to Ultrasound 

## 2023-07-05 NOTE — ED Notes (Signed)
Patient is requesting more pain medication.

## 2023-07-05 NOTE — Hospital Course (Addendum)
Patrick Bailey. is a 62 y.o. 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  Patient treated symptomatically, also giving fluids. Patient condition has been improving.  He states that he has chronic diarrhea since starting chemotherapy.  Symptoms much worse for the last few days. Stool test negative for C. difficile.  At this point, condition had improved, medically stable for discharge.

## 2023-07-05 NOTE — ED Notes (Signed)
Receiving unit called and made aware of pt's pending arrival.

## 2023-07-05 NOTE — ED Notes (Signed)
Patient ate 100% breakfast tray 

## 2023-07-05 NOTE — ED Notes (Signed)
Pt requested to go to bathroom for bowel movement. I asked if patient would be willing to use the bedside toilet. Pt became upset, stated "he can't use that damn thing to go #2".RN entered room after hearing the comments. The pt the proceeded to get himself out of bed, where he became unsteady on his feet. This tech and RN per pts request escorted pt to restroom. Educated pt on once he was done with his bowel movement, to pull the call bell for assistance back to his room.

## 2023-07-05 NOTE — Progress Notes (Signed)
Progress Note   Patient: Patrick Bailey. ZOX:[id number] DOB: 05-May-1961 DOA: 07/04/2023     1 DOS: the patient was seen and examined on 07/05/2023   Brief hospital course: Patrick Bailey. is a 62 y.o. 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  Patient treated symptomatically, also giving fluids.   Principal Problem:   Abdominal pain Active Problems:   Nausea vomiting and diarrhea   Malignant neoplasm of appendix (HCC)   Peritoneal carcinomatosis (HCC)   Hypokalemia   Hypomagnesemia   Electrolyte disturbance   Hypocalcemia   History of cerebellar stroke   COPD (chronic obstructive pulmonary disease) (HCC)   HLD (hyperlipidemia)   Essential hypertension   Chronic diastolic CHF (congestive heart failure) (HCC)   Hypothyroidism   History of alcohol abuse   Depression with anxiety   Hyponatremia   Assessment and Plan: Acute gastroenteritis. Patient had a significant nausea vomiting and watery diarrhea.  Stool study including C. difficile and GI panel was ordered, but the patient has not have another bowel movement since last night. Patient has a worsening abdominal cramping pain due to diarrhea.  He was taking Dilaudid chronically for cancer pain.  I will change it to IV form for 24 hours. Monitor the patient overnight, if no additional diarrhea, patient can be discharged home tomorrow.  Malignant neoplasm of appendix and Peritoneal carcinomatosis (HCC) Malignant ascites. : Patient is on chemotherapy, last dose was 16 days ago in [facility] -f/u with oncologist in [facility] Patient is scheduled to have paracentesis.   Electrolytes disturbance including hypokalemia, hypomagnesemia and  hypocalcemia: Hyponatremia. Potassium 2.8, given 20 mEq of IV KCl and 80 mEq of oral potassium.  Recheck potassium level tomorrow.  Magnesium has normalized.  Recheck mag tomorrow.   History of cerebellar stroke -Aspirin, Plavix, Lipitor   COPD (chronic obstructive pulmonary disease) (HCC): Stable    HLD (hyperlipidemia) -Lipitor and fenofibrate   Essential hypertension -IV hydralazine as needed -Continue home metoprolol, amlodipine   Chronic diastolic CHF (congestive heart failure) (HCC): 2D echo 07/01/2022 showed EF of 55 to 60% with grade 1 diastolic dysfunction.  Stable, no exacerbation.   Hypothyroidism -Continue Synthroid   History of alcohol abuse:  No recent drinking, no risk for withdrawal.   Depression with anxiety -Continue home medications            Subjective:  Patient still complaining of abdominal pain, but has not had a bowel movement today.  Physical Exam: Vitals:   07/05/23 0935 07/05/23 0940 07/05/23 0944 07/05/23 0945  BP: 112/87 (!) 128/90  (!) 139/92  Pulse: 86 88  84  Resp:  17  16  Temp:    (!) 97.5 F (36.4 C)  TempSrc:    Oral  SpO2:   97% 97%   General exam: Appears calm and comfortable  Respiratory system: Clear to auscultation. Respiratory effort normal. Cardiovascular system: S1 & S2 heard, RRR. No JVD, murmurs, rubs, gallops or clicks. No pedal edema. Gastrointestinal system: Abdomen is distended, soft and diffuse tender. No organomegaly or masses felt. Normal bowel sounds heard. Central nervous system: Alert and oriented. No focal neurological deficits. Extremities: Symmetric 5 x 5 power. Skin: No rashes, lesions or ulcers Psychiatry: Judgement and insight appear normal. Mood & affect appropriate.    Data Reviewed:  Lab results  reviewed.  Family Communication: None  Disposition: Status is: Inpatient Remains inpatient appropriate because: Severity of disease, IV treatment.     Time spent: 35  minutes  Author: Marrion Coy, MD 07/05/2023 12:41 PM  For on call review [email or web address].

## 2023-07-06 DIAGNOSIS — K529 Noninfective gastroenteritis and colitis, unspecified: Secondary | ICD-10-CM

## 2023-07-06 DIAGNOSIS — C786 Secondary malignant neoplasm of retroperitoneum and peritoneum: Secondary | ICD-10-CM | POA: Diagnosis not present

## 2023-07-06 DIAGNOSIS — C181 Malignant neoplasm of appendix: Secondary | ICD-10-CM | POA: Diagnosis not present

## 2023-07-06 LAB — C DIFFICILE QUICK SCREEN W PCR REFLEX
C Diff antigen: NEGATIVE
C Diff interpretation: NOT DETECTED
C Diff toxin: NEGATIVE

## 2023-07-06 LAB — GASTROINTESTINAL PANEL BY PCR, STOOL (REPLACES STOOL CULTURE)

## 2023-07-06 LAB — MAGNESIUM: Magnesium: 2.1 mg/dL (ref 1.7–2.4)

## 2023-07-06 LAB — BASIC METABOLIC PANEL
Anion gap: 9 (ref 5–15)
BUN: 10 mg/dL (ref 8–23)
CO2: 27 mmol/L (ref 22–32)
Calcium: 6.6 mg/dL — ABNORMAL LOW (ref 8.9–10.3)
Chloride: 96 mmol/L — ABNORMAL LOW (ref 98–111)
Creatinine, Ser: 0.65 mg/dL (ref 0.61–1.24)
GFR, Estimated: >60 mL/min (ref >60)
Glucose, Bld: 88 mg/dL (ref 70–99)
Potassium: 3.7 mmol/L (ref 3.5–5.1)
Sodium: 132 mmol/L — ABNORMAL LOW (ref 135–145)

## 2023-07-06 LAB — PHOSPHORUS: Phosphorus: 2.7 mg/dL (ref 2.5–4.6)

## 2023-07-06 LAB — CALCITRIOL (1,25 DI-OH VIT D): Vit D, 1,25-Dihydroxy: 80 pg/mL (ref 24.8–81.5)

## 2023-07-06 MED ORDER — POTASSIUM CHLORIDE CRYS ER 10 MEQ PO TBCR: 10.0000 meq | EXTENDED_RELEASE_TABLET | Freq: Two times a day (BID) | ORAL | 0 refills | Status: DC

## 2023-07-06 MED ORDER — OXYCODONE HCL 5 MG PO TABS
7.5000 mg | ORAL_TABLET | ORAL | Status: DC | PRN
  Administered 2023-07-06 (×2): 7.5 mg via ORAL
  Filled 2023-07-06 (×2): qty 2

## 2023-07-06 MED ORDER — PANCRELIPASE (LIP-PROT-AMYL) 36000-114000 UNITS PO CPEP: ORAL_CAPSULE | ORAL | 11 refills | Status: DC

## 2023-07-06 MED ORDER — HEPARIN SOD (PORK) LOCK FLUSH 100 UNIT/ML IV SOLN
500.0000 [IU] | Freq: Once | INTRAVENOUS | Status: AC
  Administered 2023-07-06: 500 [IU] via INTRAVENOUS
  Filled 2023-07-06: qty 5

## 2023-07-06 MED ORDER — POTASSIUM CHLORIDE CRYS ER 20 MEQ PO TBCR
40.0000 meq | EXTENDED_RELEASE_TABLET | Freq: Once | ORAL | Status: AC
  Administered 2023-07-06: 40 meq via ORAL
  Filled 2023-07-06: qty 2

## 2023-07-06 MED ORDER — CHLORHEXIDINE GLUCONATE CLOTH 2 % EX PADS: 6.0000 | MEDICATED_PAD | Freq: Every day | CUTANEOUS | Status: DC

## 2023-07-06 NOTE — Progress Notes (Signed)
PHARMACY CONSULT NOTE - ELECTROLYTES  Pharmacy Consult for Electrolyte Monitoring and Replacement   Recent Labs: Height: 6' (182.9 cm) Weight: 79.6 kg (175 lb 7.8 oz) IBW/kg (Calculated) : 77.6 Estimated Creatinine Clearance: 103.7 mL/min (by C-G formula based on SCr of 0.65 mg/dL). Potassium (mmol/L)  Date Value  07/06/2023 3.7  03/11/2014 4.0   Magnesium (mg/dL)  Date Value  95/63/8756 2.1   Calcium (mg/dL)  Date Value  43/32/9518 6.6 (L)   Calcium, Total (mg/dL)  Date Value  84/16/6063 8.6   Albumin (g/dL)  Date Value  01/60/1093 3.2 (L)   Phosphorus (mg/dL)  Date Value  23/55/7322 2.7   Sodium (mmol/L)  Date Value  07/06/2023 132 (L)  03/11/2014 137   Ca: 6.6  Albumin: 3.2 Corrected Ca: 7.2 mg/dL  Assessment  Patrick Bailey. is a 62 y.o. male presenting with lower abdominal pain associated with nausea, vomiting, and diarrhea. PMH significant for colon cancer with metastatic disease currently on chemotherap, ETOH. Pharmacy has been consulted to monitor and replace electrolytes.  Diet: reg heart MIVF: None Pertinent medications: - Calcium carbonate 1,250 mg 3 times daily with meals - Magnesium oxide 400 mg PO BID  Goal of Therapy: Electrolytes WNL  Plan:  K 3.7  Will order Potassium 40 mEq PO x 1  Check BMP, Mg, Phos with AM labs   Thank you for allowing pharmacy to be a part of this patient's care.  Alison Breeding A, PharmD 07/06/2023 11:24 AM

## 2023-07-06 NOTE — Discharge Summary (Signed)
Physician Discharge Summary   Patient: Patrick Bailey. MRN: [id number] DOB: 09/12/1960  Admit date:     07/04/2023  Discharge date: 07/06/23  Discharge Physician: Marrion Coy   PCP: Eustaquio Boyden, MD   Recommendations at discharge:   Follow with PCP in 1 week. Follow-up with oncology as a previous scheduled.  Discharge Diagnoses: Principal Problem:   Abdominal pain Active Problems:   Nausea vomiting and diarrhea   Malignant neoplasm of appendix (HCC)   Peritoneal carcinomatosis (HCC)   Hypokalemia   Hypomagnesemia   Electrolyte disturbance   Hypocalcemia   History of cerebellar stroke   COPD (chronic obstructive pulmonary disease) (HCC)   HLD (hyperlipidemia)   Essential hypertension   Chronic diastolic CHF (congestive heart failure) (HCC)   Hypothyroidism   History of alcohol abuse   Depression with anxiety   Hyponatremia  Resolved Problems:   * No resolved hospital problems. *  Hospital Course: Decota Rudge. is a 62 y.o. 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  Patient treated symptomatically, also giving fluids. Patient condition has been improving.  He states that he has chronic diarrhea since starting chemotherapy.  Symptoms much worse for the last few days. Stool test negative for C. difficile.  At this point, condition had improved, medically stable for discharge.  Assessment and Plan: Acute gastroenteritis. Chronic diarrhea. Patient had a significant nausea vomiting and watery diarrhea.  C. difficile toxin negative. Patient is treated with fluids and symptomatic treatment.  Nausea vomiting better, tolerating diet.  Patient still has some loose stools, he states that he has been  having loose stools since starting chemotherapy.  It appears that he he has back to baseline. At this point, he is medically stable for discharge. Due to concern for malabsorption, I will place him on Creon.    Malignant neoplasm of appendix and Peritoneal carcinomatosis (HCC) Malignant ascites. Chronic abdominal pain. : Patient is on chemotherapy, last dose was 16 days ago in [facility] He had a paracentesis removing 1.2 L of ascites.  Fluid does not seem to be infected as WBC only about 5%. Patient also has chronic abdominal pain from cancer, he is taking multiple pain medicines prescribed by his cancer doctor.   Electrolytes disturbance including hypokalemia, hypomagnesemia and hypocalcemia: Hyponatremia. Condition has improved, potassium, magnesium and phosphate are normal. At this point, patient to be continued on magnesium supplements, also prescribed potassium 10 mEq twice a day.   History of cerebellar stroke -Aspirin, Plavix, Lipitor   COPD (chronic obstructive pulmonary disease) (HCC): Stable     HLD (hyperlipidemia) -Lipitor and fenofibrate   Essential hypertension Continue home metoprolol.  Discontinue amlodipine as patient blood pressure drops after taking pain medicine.   Chronic diastolic CHF (congestive heart failure) (HCC): 2D echo 07/01/2022 showed EF of 55 to 60% with grade 1 diastolic dysfunction.  Stable, no exacerbation.   Hypothyroidism -Continue Synthroid   History of alcohol abuse:  No recent drinking, no risk for withdrawal.   Depression with anxiety -Continue home medications        Consultants: None Procedures performed: None  Disposition: Home Diet recommendation:  Discharge Diet Orders (From admission, onward)     Start     Ordered   07/06/23 0000  Diet - low sodium heart healthy  07/06/23 1236           Cardiac diet DISCHARGE MEDICATION: Allergies as of 07/06/2023       Reactions   Hydrocodone Hives   Per pt able to  tolerate hydromorphone        Medication List     STOP taking these medications    amLODipine 10 MG tablet Commonly known as: NORVASC   azithromycin 250 MG tablet Commonly known as: Zithromax Z-Pak   DentaGel 1.1 % Gel dental gel Generic drug: sodium fluoride   predniSONE 20 MG tablet Commonly known as: DELTASONE   valACYclovir 1000 MG tablet Commonly known as: VALTREX       TAKE these medications    amitriptyline 100 MG tablet Commonly known as: ELAVIL Take 1 tablet (100 mg total) by mouth at bedtime.   aspirin EC 81 MG tablet Take 1 tablet (81 mg total) by mouth daily. Swallow whole.   atorvastatin 40 MG tablet Commonly known as: LIPITOR Take 1 tablet (40 mg total) by mouth daily.   clopidogrel 75 MG tablet Commonly known as: PLAVIX Take 1 tablet by mouth once daily   fenofibrate 160 MG tablet Take 1 tablet by mouth once daily   gabapentin 400 MG capsule Commonly known as: NEURONTIN Take 1 capsule (400 mg total) by mouth 2 (two) times daily AND 3 capsules (1,200 mg total) at bedtime. TAKE 1 CAPSULE BY MOUTH TWICE DAILY AND 3 AT BEDTIME Strength: 400 mg.   HYDROmorphone 4 MG tablet Commonly known as: DILAUDID Take 4 mg by mouth every 4 (four) hours as needed.   levothyroxine 150 MCG tablet Commonly known as: SYNTHROID Take 1 tablet (150 mcg total) by mouth daily.   lipase/protease/amylase [address] UNITS Cpep capsule Commonly known as: Creon Take 2 capsules (72,000 Units total) by mouth 3 (three) times daily with meals. May also take 1 capsule (36,000 Units total) as needed (with snacks - up to 4 snacks daily).   MAGnesium-Oxide 400 (240 Mg) MG tablet Generic drug: magnesium oxide Take 1 tablet by mouth 2 (two) times daily.   methocarbamol 500 MG tablet Commonly known as: ROBAXIN Take 1,000 mg by mouth 2 (two) times daily.   metoprolol tartrate 100 MG tablet Commonly known as: LOPRESSOR Take 1 tablet (100 mg total) by mouth 2 (two) times daily.    Mitigare 0.6 MG Caps Generic drug: Colchicine Take 0.6 mg by mouth daily as needed (gout flare). TAKE 1 CAPSULE BY MOUTH IN THE MORNING AS NEEDED What changed: additional instructions   morphine 15 MG tablet Commonly known as: MSIR Take 0.5 tablets (7.5 mg total) by mouth every 4 (four) hours as needed for severe pain.   naloxone 4 MG/0.1ML Liqd nasal spray kit Commonly known as: NARCAN Place 1 spray into the nose daily.   ondansetron 4 MG tablet Commonly known as: Zofran Take 1 tablet (4 mg total) by mouth daily as needed for nausea or vomiting.   oxyCODONE 5 MG immediate release tablet Commonly known as: Oxy IR/ROXICODONE Take 5 mg by mouth every 6 (six) hours as needed.   pantoprazole 40 MG tablet Commonly known as: PROTONIX Take 1 tablet (40 mg total) by mouth daily.   potassium chloride 10 MEQ tablet Commonly known as: KLOR-CON M Take 1 tablet (10 mEq total) by mouth 2 (two) times daily for 14 days.   prochlorperazine 10 MG tablet Commonly known as: COMPAZINE Take 10 mg by mouth every 6 (six) hours as needed.   tamsulosin 0.4 MG  Caps capsule Commonly known as: FLOMAX Take 1 capsule by mouth daily.        Follow-up Information     Eustaquio Boyden, MD Follow up in 1 week(s).   Specialty: Family Medicine Why: Hospital follow up Contact information: [address] [place] [address] [phone]                Discharge Exam: Ceasar Mons Weights   07/05/23 1722 07/06/23 0429  Weight: 83.9 kg 79.6 kg   General exam: Appears calm and comfortable  Respiratory system: Clear to auscultation. Respiratory effort normal. Cardiovascular system: S1 & S2 heard, RRR. No JVD, murmurs, rubs, gallops or clicks. No pedal edema. Gastrointestinal system: Abdomen is distended, soft and diffusely tender. No organomegaly or masses felt. Normal bowel sounds heard. Central nervous system: Alert and oriented. No focal neurological deficits. Extremities: Symmetric  5 x 5 power. Skin: No rashes, lesions or ulcers Psychiatry: Judgement and insight appear normal. Mood & affect appropriate.    Condition at discharge: fair  The results of significant diagnostics from this hospitalization (including imaging, microbiology, ancillary and laboratory) are listed below for reference.   Imaging Studies: US Paracentesis  Result Date: 07/05/2023 INDICATION: History of malignant neoplasm of the appendix and peritoneal carcinomatosis. Presented to the ED with abdominal pain, nausea and vomiting. Found to have a small amount of ascites. Request for therapeutic and diagnostic paracentesis EXAM: ULTRASOUND GUIDED THERAPEUTIC AND DIAGNOSTIC PARACENTESIS MEDICATIONS: None. COMPLICATIONS: None immediate. PROCEDURE: Informed written consent was obtained from the patient after a discussion of the risks, benefits and alternatives to treatment. A timeout was performed prior to the initiation of the procedure. Initial ultrasound scanning demonstrates a small amount of ascites within the left lower abdominal quadrant. The left lower abdomen was prepped and draped in the usual sterile fashion. 1% lidocaine was used for local anesthesia. Following this, a 19 gauge, 7-cm, Yueh catheter was introduced. An ultrasound image was saved for documentation purposes. The paracentesis was performed. The catheter was removed and a dressing was applied. The patient tolerated the procedure well without immediate post procedural complication. FINDINGS: A total of approximately 1.2 L of straw-colored fluid was removed. Samples were sent to the laboratory as requested by the clinical team. IMPRESSION: Successful ultrasound-guided therapeutic and diagnostic paracentesis yielding 1.2 liters of peritoneal fluid. Performed by Anders Grant NP Electronically Signed   By: Richarda Overlie M.D.   On: 07/05/2023 15:45   CT ABDOMEN PELVIS W CONTRAST  Result Date: 07/04/2023 CLINICAL DATA:  Sharp paraumbilical  abdominal pain. Nausea and vomiting. Colon carcinoma with peritoneal carcinomatosis. * Tracking Code: BO * EXAM: CT ABDOMEN AND PELVIS WITH CONTRAST TECHNIQUE: Multidetector CT imaging of the abdomen and pelvis was performed using the standard protocol following bolus administration of intravenous contrast. RADIATION DOSE REDUCTION: This exam was performed according to the departmental dose-optimization program which includes automated exposure control, adjustment of the mA and/or kV according to patient size and/or use of iterative reconstruction technique. CONTRAST:  OMNIPAQUE IOHEXOL 300 MG/ML  SOLN COMPARISON:  04/12/2023 FINDINGS: Lower Chest: No acute findings. Hepatobiliary: 2 cm peripherally enhancing low-attenuation lesion in the posterior right hepatic lobe shows no significant change. No new or enlarging intrahepatic masses are identified. Increased size of cystic lesion along the capsular surface of the right hepatic lobe, consistent with capsular peritoneal tumor implant. Perihepatic peritoneal soft tissue thickening remains stable. Gallbladder is unremarkable. No evidence of biliary ductal dilatation. Pancreas:  No mass or inflammatory changes. Spleen: Within normal  limits in size and appearance. Adrenals/Urinary Tract: No suspicious masses identified. No evidence of ureteral calculi or hydronephrosis. Stomach/Bowel: Postop changes from right hemicolectomy are seen. Soft tissue density is seen surrounding the enterocolic anastomosis with increased soft tissue density in the adjacent omental and mesenteric fat. Increased ascites is also seen which shows multiple loculations and peritoneal thickening. These findings are consistent with increased peritoneal carcinomatosis. Mild decrease in dilatation of small bowel is seen since prior exam. Vascular/Lymphatic: No pathologically enlarged lymph nodes. No acute vascular findings. Reproductive:  No mass or other significant abnormality. Other:  None.  Musculoskeletal:  No suspicious bone lesions identified. IMPRESSION: Increased ascites and peritoneal carcinomatosis since prior exam. Mild decrease in dilatation of small bowel since prior exam. Stable 2 cm intrahepatic lesion in the posterior right hepatic lobe. Hepatic metastasis cannot be excluded. Electronically Signed   By: Danae Orleans M.D.   On: 07/04/2023 17:28    Microbiology: Results for orders placed or performed during the hospital encounter of 07/04/23  Body fluid culture w Gram Stain     Status: None (Preliminary result)   Collection Time: 07/05/23  2:51 PM   Specimen: PATH Cytology Peritoneal fluid  Result Value Ref Range Status   Specimen Description   Final    FLUID Performed at [facility], [address] [place], [place] [address]    Special Requests   Final     CYTO PERI Performed at [facility], [address] [place], [place] [address]    Gram Stain   Final    WBC PRESENT,BOTH PMN AND MONONUCLEAR NO ORGANISMS SEEN CYTOSPIN SMEAR    Culture   Final    NO GROWTH < 24 HOURS Performed at [facility], [address], [place], [place] [address]    Report Status PENDING  Incomplete  C Difficile Quick Screen w PCR reflex     Status: None   Collection Time: 07/06/23 11:00 AM   Specimen: STOOL  Result Value Ref Range Status   C Diff antigen NEGATIVE NEGATIVE Final   C Diff toxin NEGATIVE NEGATIVE Final   C Diff interpretation No C. difficile detected.  Final    Comment: Performed at [facility], [address] [place], [place] [address]    Labs: CBC: Recent Labs  Lab 07/04/23 1314 07/05/23 0523  WBC 7.4 7.1  HGB 11.2* 10.3*  HCT 34.6* 32.0*  MCV 83.4 82.3  PLT 333 313   Basic Metabolic Panel: Recent Labs  Lab 07/04/23 1314 07/04/23 1853 07/05/23 0523 07/05/23 1559 07/06/23 0521  NA 136  --  134*  --  132*  K 2.7*  --  2.8* 3.3* 3.7  CL 95*  --  96*  --  96*  CO2 27  --  27  --  27  GLUCOSE 103*  --   108*  --  88  BUN 13  --  7*  --  10  CREATININE 0.73  --  0.78  --  0.65  CALCIUM 5.7*  --  6.1*  --  6.6*  MG  --  0.5* 2.2  --  2.1  PHOS  --  3.5 3.6  --  2.7   Liver Function Tests: Recent Labs  Lab 07/04/23 1314  AST 22  ALT 8  ALKPHOS 75  BILITOT 0.6  PROT 7.6  ALBUMIN 3.2*   CBG: No results for input(s): "GLUCAP" in the last 168 hours.  Discharge time spent: greater than 30 minutes.  Signed: Marrion Coy, MD [facility] 07/06/2023

## 2023-07-06 NOTE — Progress Notes (Signed)
   07/06/23 0750  Vitals  BP (!) 85/62  MAP (mmHg) 70   Notified MD Chipper Herb

## 2023-07-06 NOTE — TOC Initial Note (Signed)
Transition of Care (TOC) - Initial/Assessment Note    Patient Details  Name: Patrick Bailey. MRN: [id number] Date of Birth: 1961/01/06  Transition of Care [facility]) CM/SW Contact:    Marquita Palms, LCSW Phone Number: 07/06/2023, 10:52 AM  Clinical Narrative:                  CSW met with patient bedside. Patient in pain and limited on questions he would answer. CSW completed HRA. Patient being admitted.       Patient Goals and CMS Choice            Expected Discharge Plan and Services                                              Prior Living Arrangements/Services                       Activities of Daily Living   ADL Screening (condition at time of admission) Independently performs ADLs?: No Does the patient have a NEW difficulty with bathing/dressing/toileting/self-feeding that is expected to last >3 days?: No Does the patient have a NEW difficulty with getting in/out of bed, walking, or climbing stairs that is expected to last >3 days?: Yes (Initiates electronic notice to provider for possible PT consult) Does the patient have a NEW difficulty with communication that is expected to last >3 days?: No Is the patient deaf or have difficulty hearing?: No Does the patient have difficulty seeing, even when wearing glasses/contacts?: No Does the patient have difficulty concentrating, remembering, or making decisions?: No  Permission Sought/Granted                  Emotional Assessment              Admission diagnosis:  Hypocalcemia [E83.51] Hypokalemia [E87.6] Generalized abdominal pain [R10.84] Colon cancer metastasized to multiple sites [facility]) [C18.9] Nausea vomiting and diarrhea [R11.2, R19.7] Abdominal pain [R10.9] Patient Active Problem List   Diagnosis Date Noted   Hyponatremia 07/05/2023   Abdominal pain 07/04/2023   COPD (chronic obstructive pulmonary disease) (HCC) 07/04/2023   HLD (hyperlipidemia) 07/04/2023    Chronic diastolic CHF (congestive heart failure) (HCC) 07/04/2023   Electrolyte disturbance 07/04/2023   Hypocalcemia 07/04/2023   Nausea vomiting and diarrhea 07/04/2023   Depression with anxiety 07/04/2023   Asthmatic bronchitis 03/17/2023   Chronic diarrhea 10/14/2022   Drug-induced skin desquamation 10/14/2022   Peritoneal carcinomatosis (HCC) 10/14/2022   Malnutrition of moderate degree 09/30/2022   Ileus (HCC) 09/28/2022   SIRS (systemic inflammatory response syndrome) (HCC) 09/28/2022   Lactic acidosis 09/28/2022   UTI (urinary tract infection) 09/28/2022   AKI (acute kidney injury) (HCC) 09/28/2022   Hypokalemia 09/28/2022   Hypomagnesemia 09/28/2022   History of alcohol abuse 09/28/2022   Internal carotid artery stenosis, right 08/10/2022   Acute ischemic right MCA stroke (HCC) 07/01/2022   Impacted cerumen, right ear 05/08/2022   Penile ulcer 03/04/2022   Immunocompromised (HCC) 03/04/2022   Cyst of lumbar facet joint 03/04/2022   Advanced directives, counseling/discussion 01/08/2022   GERD (gastroesophageal reflux disease) 01/08/2022   Synovial cyst of lumbar facet joint 01/08/2022   Malignant neoplasm of appendix (HCC) 12/01/2021   Other spondylosis with radiculopathy, lumbar region 11/06/2021   Facet hypertrophy of lumbar region 11/06/2021   Closed fracture of distal end of radius  03/10/2021   Left leg numbness 07/20/2020   BPH (benign prostatic hyperplasia) 01/26/2020   Central retinal artery occlusion of right eye 01/17/2020   Carotid stenosis 01/14/2020   Diastolic dysfunction 01/14/2020   Herpes labialis 01/05/2020   Hearing loss secondary to cerumen impaction, right 01/05/2020   S/P total knee arthroplasty 01/31/2019   Primary osteoarthritis of right knee 01/10/2019   Peripheral neuropathy 03/13/2018   Scalp lesion 08/04/2016   History of cerebellar stroke 07/17/2016   Hypertriglyceridemia 06/17/2015   Hypothyroidism    Degenerative arthritis of knee,  bilateral 10/19/2014   Pseudogout 10/19/2014   Leg pain 08/10/2014   Essential hypertension 08/13/2010   Alcohol use 02/05/2009   TOBACCO ABUSE, HX OF 02/05/2009   Alcohol abuse 02/05/2009   History of oral cancer 11/01/2008   Anxiety state 07/24/2008   Chronic insomnia 07/24/2008   Shoulder joint pain 12/20/2006   PCP:  Eustaquio Boyden, MD Pharmacy:   [facility] [address], [place] - [address] [place] [address] Phone: [phone] Fax: [phone]  [facility] [phone] - [place], [place] - [address] [place] [address] Phone: [phone] Fax: [phone]     Social Determinants of Health (SDOH) Social History: SDOH Screenings   Food Insecurity: No Food Insecurity (07/05/2023)  Housing: Low Risk  (07/05/2023)  Transportation Needs: No Transportation Needs (07/05/2023)  Utilities: Not At Risk (07/05/2023)  Depression (PHQ2-9): Medium Risk (01/06/2022)  Financial Resource Strain: Low Risk  (12/15/2021)   Received from [facility], [facility]  Tobacco Use: Medium Risk (07/05/2023)  Health Literacy: Low Risk  (12/15/2021)   Received from [facility], [facility]   SDOH Interventions:     Readmission Risk Interventions    07/06/2023   10:49 AM  Readmission Risk Prevention Plan  Transportation Screening Complete  Medication Review (RN Care Manager) Complete  PCP or Specialist appointment within 3-5 days of discharge Complete  HRI or Home Care Consult Complete  SW Recovery Care/Counseling Consult Complete  Palliative Care Screening Not Applicable  Skilled Nursing Facility Not Complete  SNF Comments patient not agreeable too SNF

## 2023-07-06 NOTE — Plan of Care (Signed)
  Problem: Education: Goal: Knowledge of General Education information will improve Description Including pain rating scale, medication(s)/side effects and non-pharmacologic comfort measures Outcome: Progressing   

## 2023-07-07 ENCOUNTER — Other Ambulatory Visit (HOSPITAL_COMMUNITY): Payer: Self-pay

## 2023-07-07 ENCOUNTER — Telehealth (HOSPITAL_COMMUNITY): Payer: Self-pay | Admitting: Pharmacy Technician

## 2023-07-07 DIAGNOSIS — C181 Malignant neoplasm of appendix: Principal | ICD-10-CM

## 2023-07-07 DIAGNOSIS — C786 Secondary malignant neoplasm of retroperitoneum and peritoneum: Principal | ICD-10-CM

## 2023-07-07 NOTE — Telephone Encounter (Signed)
Pharmacy Patient Advocate Encounter  Received notification from [facility] that Prior Authorization for Creon 36000-114000 UNIT dr capsules  has been APPROVED from 07/07/2023 to 07/06/2024. Ran test claim, Copay is $0.00. This test claim was processed through [facility]- copay amounts may vary at other pharmacies due to pharmacy/plan contracts, or as the patient moves through the different stages of their insurance plan.   PA #/Case ID/Reference #: [id number]

## 2023-07-07 NOTE — Telephone Encounter (Signed)
Pharmacy Patient Advocate Encounter   Received notification from Fax that prior authorization for Creon 36000-114000 UNIT dr capsules is required/requested.   Insurance verification completed.   The patient is insured through [facility] .   Per test claim: PA required; PA submitted to above mentioned insurance via CoverMyMeds Key/confirmation #/EOC [id number] Status is pending

## 2023-07-08 ENCOUNTER — Emergency Department: Payer: BC Managed Care – PPO

## 2023-07-08 ENCOUNTER — Emergency Department
Admission: EM | Admit: 2023-07-08 | Discharge: 2023-07-08 | Disposition: A | Discharge status: Home / Self Care | Payer: BC Managed Care – PPO | Attending: Emergency Medicine | Admitting: Emergency Medicine

## 2023-07-08 ENCOUNTER — Other Ambulatory Visit: Payer: Self-pay

## 2023-07-08 ENCOUNTER — Encounter: Payer: Self-pay | Admitting: Emergency Medicine

## 2023-07-08 DIAGNOSIS — C181 Malignant neoplasm of appendix: Principal | ICD-10-CM

## 2023-07-08 DIAGNOSIS — C786 Secondary malignant neoplasm of retroperitoneum and peritoneum: Principal | ICD-10-CM

## 2023-07-08 DIAGNOSIS — H532 Diplopia: Secondary | ICD-10-CM | POA: Diagnosis not present

## 2023-07-08 DIAGNOSIS — R1084 Generalized abdominal pain: Secondary | ICD-10-CM | POA: Diagnosis not present

## 2023-07-08 DIAGNOSIS — I6782 Cerebral ischemia: Secondary | ICD-10-CM | POA: Diagnosis not present

## 2023-07-08 LAB — COMPREHENSIVE METABOLIC PANEL
ALT: 8 U/L (ref 0–44)
AST: 17 U/L (ref 15–41)
Albumin: 3.2 g/dL — ABNORMAL LOW (ref 3.5–5.0)
Alkaline Phosphatase: 74 U/L (ref 38–126)
Anion gap: 9 (ref 5–15)
BUN: 11 mg/dL (ref 8–23)
CO2: 26 mmol/L (ref 22–32)
Calcium: 8.5 mg/dL — ABNORMAL LOW (ref 8.9–10.3)
Chloride: 98 mmol/L (ref 98–111)
Creatinine, Ser: 0.86 mg/dL (ref 0.61–1.24)
GFR, Estimated: >60 mL/min (ref >60)
Glucose, Bld: 106 mg/dL — ABNORMAL HIGH (ref 70–99)
Potassium: 3.8 mmol/L (ref 3.5–5.1)
Sodium: 133 mmol/L — ABNORMAL LOW (ref 135–145)
Total Bilirubin: 1 mg/dL (ref <1.2)
Total Protein: 7.3 g/dL (ref 6.5–8.1)

## 2023-07-08 LAB — CBC
HCT: 33 % — ABNORMAL LOW (ref 39.0–52.0)
Hemoglobin: 10.6 g/dL — ABNORMAL LOW (ref 13.0–17.0)
MCH: 26.8 pg (ref 26.0–34.0)
MCHC: 32.1 g/dL (ref 30.0–36.0)
MCV: 83.3 fL (ref 80.0–100.0)
Platelets: 337 10*3/uL (ref 150–400)
RBC: 3.96 MIL/uL — ABNORMAL LOW (ref 4.22–5.81)
RDW: 15.7 % — ABNORMAL HIGH (ref 11.5–15.5)
WBC: 8.3 10*3/uL (ref 4.0–10.5)
nRBC: 0 % (ref 0.0–0.2)

## 2023-07-08 LAB — CYTOLOGY - NON PAP

## 2023-07-08 LAB — LIPASE, BLOOD: Lipase: 30 U/L (ref 11–51)

## 2023-07-08 MED ORDER — GADOBUTROL 1 MMOL/ML IV SOLN
8.0000 mL | Freq: Once | INTRAVENOUS | Status: AC | PRN
  Administered 2023-07-08: 8 mL via INTRAVENOUS

## 2023-07-08 MED ORDER — HYDROMORPHONE HCL 1 MG/ML IJ SOLN
1.0000 mg | INTRAMUSCULAR | Status: AC
  Administered 2023-07-08: 1 mg via INTRAVENOUS
  Filled 2023-07-08: qty 1

## 2023-07-08 MED ORDER — HYDROMORPHONE HCL 4 MG PO TABS: 4.0000 mg | ORAL_TABLET | ORAL | 0 refills | Status: AC | PRN

## 2023-07-08 MED ORDER — SODIUM CHLORIDE 0.9 % IV BOLUS
500.0000 mL | Freq: Once | INTRAVENOUS | Status: AC
  Administered 2023-07-08: 500 mL via INTRAVENOUS

## 2023-07-08 MED ORDER — MORPHINE SULFATE 15 MG PO TABS
7.5000 mg | ORAL_TABLET | Freq: Once | ORAL | Status: AC
  Administered 2023-07-08: 7.5 mg via ORAL
  Filled 2023-07-08: qty 1

## 2023-07-08 MED ORDER — HYDROMORPHONE 4 MG TABLET
ORAL_TABLET | ORAL | 0 refills | 15 days | Status: CP | PRN
Start: 2023-07-08 — End: ?

## 2023-07-08 NOTE — Unmapped (Signed)
.  [facility] Other Call ONC Phone Room Smart Lists: Medication Refill -     Hi,    Mr. Kingslee called in reference to the patient Scherrie Gerlach requesting a medication refill for his son for the following:    Medication: Hydromorphone 4mg   Pharmacy: [facility] [address] [place]     Please contact Mr Traelyn Gentleman  back at [phone].    The expected turnaround time is 3-4 business days     Thank you,  Noland Fordyce  [facility] Communication Center  [phone]    [facility]

## 2023-07-08 NOTE — Unmapped (Signed)
Specialty Medication(s): [place]     Mr. has been dis-enrolled from the [facility] specialty pharmacy services due to medication discontinuation resulting from side effect intolerance.    Additional information provided to the patient: NA    Kermit Balo, [facility]  [facility] Specialty Pharmacist

## 2023-07-08 NOTE — Unmapped (Signed)
[facility] Other Call ONC Phone Room Smart Lists: General -     Hi,     Domanik contacted the Communication Center regarding the following:    - Pt requested to speak with Dr. Forbes Cellar. Pt stated that [facility]. Pt stated that he has an appointment today, but wanted to discuss what to do about it.    Please contact Opie at [phone].    Thanks in advance,    Sharl Ma  [facility] Cancer Communication Center   [phone]    [facility]

## 2023-07-08 NOTE — Unmapped (Signed)
[facility] Other Call ONC Phone Room Smart Lists: Urgent symptoms/symptom management -     Hi,     Ray Sr.(Father) has contacted the Communication Center on behalf of Sergio Perez in regards to the following symptom:     Patient is seeing double, he is laying down and does have the strength to walk. The double vision began approximately 2 hours ago.     -Ray Sr. originally called back to provide the  medication name for a refill for hydromorphone 4mg  to be sent to [facility], but during the call, he reported the patient's symptoms.    Caller reports that patient's treatment status is not currently in active treatment.    Please contact Ray Sr. at [phone] .    Symptoms deemed to be Urgent are marked High Priority    Thank you,  Durward Fortes   [facility] Cancer Communication Center   [phone]    [facility]

## 2023-07-08 NOTE — Unmapped (Signed)
Returned call to pt's father.States pt started experiencing a sudden onset of chest pain, SOB, inability to walk, and double vision around. Also noting diarrhea x3-4 this afternoon.Pt was also requesting refill of pain meds.States pt was discharged from the hospital yesterday afternoon.Was able to walk and was not experiencing any chest pain or SOB.    Let him know pt needs to call EMS.States he will relay the message to the pt and hopefully he will agree with this plan.Let him know I will relay the message to his team and asked him to call back with any further needs.

## 2023-07-08 NOTE — Unmapped (Signed)
RN Navigator contacted pt to follow up on Triage call. Pt currently in ED at [facility]. RN Navigator will follow up with pt after discharge.

## 2023-07-08 NOTE — ED Triage Notes (Signed)
Patient to ED via POV for lower abd pain. States ongoing for over a year since cancer dx- seen for same on 12/1. Cancer pt- currently in between chemo. Also c/o blurred vision with spots and generalized weakness that started yesterday around 7pm. Denies numbness or unilateral weakness.

## 2023-07-08 NOTE — ED Provider Notes (Signed)
[facility] Provider Note    Event Date/Time   First MD Initiated Contact with Patient 07/08/23 1416     (approximate)   History   Abdominal Pain   HPI  Patrick Bailey. is a 62 y.o. 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  Also history of hypothyroidism coronary disease COPD depression throat cancer alcohol abuse.  Reviewed discharge summary from December 1 patient was hospitalized for diarrhea and abdominal pain.  He tested negative for C. difficile.  He was felt medically stable for discharge on December 1.  He also had paracentesis removing 1.2 L of ascites that was not felt to be infected.  Patient is here with his father.  He reports that since around noon or maybe 4 PM yesterday he started noticing double vision.  It seems to be present when he has both eyes open.  He is not having any eye pain no headache.  No numbness no weakness no difficulty speaking or other symptoms except for lower abdominal pain which she reports is his chronic "cancer pain" which she has been experiencing for a year  He denies that he is having any severe abdominal symptoms, he reports his cancer pain in his lower abdomen in the front of his abdomen is normal for him.  This is a day-to-day pain he advised his doctor at [facility] just sent in a refill for his pain medicine.  He and his father both report they came in today to be evaluated because the double vision they were concerned and wanted make sure he does not have something like a stroke or something after talking to his doctor on the phone yesterday who encouraged him to come to the ER last night   He is continue to have loose stool since leaving the hospital.  No bloody stool no fevers.  No nausea or vomiting.  Physical Exam   Triage Vital Signs: ED Triage Vitals  Encounter Vitals Group     BP 07/08/23 1139 (!) 112/59     Systolic BP Percentile --      Diastolic  BP Percentile --      Pulse Rate 07/08/23 1139 90     Resp 07/08/23 1139 18     Temp 07/08/23 1139 97.9 F (36.6 C)     Temp Source 07/08/23 1139 Oral     SpO2 07/08/23 1139 99 %     Weight 07/08/23 1138 186 lb (84.4 kg)     Height 07/08/23 1138 6' (1.829 m)     Head Circumference --      Peak Flow --      Pain Score 07/08/23 1138 7     Pain Loc --      Pain Education --      Exclude from Growth Chart --     Most recent vital signs: Vitals:   07/08/23 1139  BP: (!) 112/59  Pulse: 90  Resp: 18  Temp: 97.9 F (36.6 C)  SpO2: 99%     General: Awake, no distress.  Cranial nerve exam reassuring.  No obvious abnormality on extraocular movement testing but when the patient looks up he does report that this causes him to have a feeling of double vision.  He denies diplopia with lateral gaze and downward gaze.  The anterior chambers appear clear.  He has no associated eye pain or injection.  Denies headache.  He moves all his extremities with 5 out of  5 strength.  He has no pronator drift.  Smile is symmetric.  Visual fields are normal.    CV:  Good peripheral perfusion.  Normal tones and rate Resp:  Normal effort.  Abd:  No distention.  Abdomen is slightly firm anteriorly large midline scar intact.  Patient reports tenderness to palpation along the mid abdomen around the region of his scar and lower abdomen.  He does however reports that that is normal for him and is not unusual this is his chronic "cancer pain" Other:     ED Results / Procedures / Treatments   Labs (all labs ordered are listed, but only abnormal results are displayed) Labs Reviewed  COMPREHENSIVE METABOLIC PANEL - Abnormal; Notable for the following components:      Result Value   Sodium 133 (*)    Glucose, Bld 106 (*)    Calcium 8.5 (*)    Albumin 3.2 (*)    All other components within normal limits  CBC - Abnormal; Notable for the following components:   RBC 3.96 (*)    Hemoglobin 10.6 (*)    HCT 33.0  (*)    RDW 15.7 (*)    All other components within normal limits  LIPASE, BLOOD  URINALYSIS, ROUTINE W REFLEX MICROSCOPIC     EKG     RADIOLOGY  No results found.  MRI brain w and Wo contrast pending, Dr. Larinda Buttery to follow-up   PROCEDURES:  Critical Care performed: No  Procedures   MEDICATIONS ORDERED IN ED: Medications  sodium chloride 0.9 % bolus 500 mL (500 mLs Intravenous New Bag/Given 07/08/23 1444)  HYDROmorphone (DILAUDID) injection 1 mg (1 mg Intravenous Given 07/08/23 1445)     IMPRESSION / MDM / ASSESSMENT AND PLAN / ED COURSE  I reviewed the triage vital signs and the nursing notes.                              Differential diagnosis includes, but is not limited to, diplopia possibly of a central cause, including symptoms or cause such as tumor, metastases, CNS lesion, stroke etc.  His visual inspection of his anterior chamber of his eyes, eye examination does not elucidate an obvious defect in visual fields or problem with acutity and he has no pain or loss of vision, rather he reports diplopia primarily with upward gaze.  Cranial nerve examination appears normal    Labs interpreted as normal comprehensive metabolic panel with exception to very mild hyponatremia and hypoalbuminemia.  Mild anemia.  Does not have any obvious frank ascites, no fever no infectious symptoms.  Normal white count.    Regarding his abdominal symptoms appear to be quite chronic.  He does report loose stool as an ongoing issue and reports that it is from his prior chemo as well as his cancer.  He does not have any acute abdominal issue but does report ongoing abdominal pain which is chronic and he reports his pain medication was refilled today by his doctor's office.    Patient's presentation is most consistent with acute complicated illness / injury requiring diagnostic workup.   ----------------------------------------- 3:09 PM on  07/08/2023 ----------------------------------------- Ongoing care assigned to Dr. Larinda Buttery.  Plan to follow-up on MRI with and without contrast for concerns of diplopia, visual acuity testing, and reassesment.       FINAL CLINICAL IMPRESSION(S) / ED DIAGNOSES   Final diagnoses:  Diplopia     Rx / DC Orders  ED Discharge Orders     None        Note:  This document was prepared using Dragon voice recognition software and may include unintentional dictation errors.   Sharyn Creamer, MD 07/08/23 [phone]

## 2023-07-08 NOTE — ED Provider Notes (Signed)
-----------------------------------------   3:00 PM on 07/08/2023 -----------------------------------------  Blood pressure (!) 112/59, pulse 90, temperature 97.9 F (36.6 C), temperature source Oral, resp. rate 18, height 6' (1.829 m), weight 84.4 kg, SpO2 99%.  Assuming care from Dr. Fanny Bien.  In short, Patrick Bailey. is a 62 y.o. male with a chief complaint of Abdominal Pain .  Refer to the original H&P for additional details.  The current plan of care is to follow-up MRI brain for double vision.  ----------------------------------------- 7:53 PM on 07/08/2023 ----------------------------------------- MRI brain with and without contrast is negative for acute process.  On reassessment, patient requesting prescription for refill of his home pain medication that he takes for chronic abdominal pain.  We will prescribe refill of his Dilaudid and he was counseled to follow-up with his cancer doctors at [facility] regarding additional pain medication.  As for his double vision, he is appropriate for outpatient follow-up with ophthalmology.  He was counseled to return to the ED for new or worsening symptoms.  Patient agrees with plan.    Chesley Noon, MD 07/08/23 [phone]

## 2023-07-09 DIAGNOSIS — C786 Secondary malignant neoplasm of retroperitoneum and peritoneum: Principal | ICD-10-CM

## 2023-07-09 DIAGNOSIS — C181 Malignant neoplasm of appendix: Principal | ICD-10-CM

## 2023-07-09 LAB — BODY FLUID CULTURE W GRAM STAIN: Culture: NO GROWTH

## 2023-07-12 ENCOUNTER — Other Ambulatory Visit: Payer: Self-pay | Admitting: Family Medicine

## 2023-07-12 DIAGNOSIS — C786 Secondary malignant neoplasm of retroperitoneum and peritoneum: Principal | ICD-10-CM

## 2023-07-12 DIAGNOSIS — C181 Malignant neoplasm of appendix: Principal | ICD-10-CM

## 2023-07-12 DIAGNOSIS — E781 Pure hyperglyceridemia: Secondary | ICD-10-CM

## 2023-07-12 NOTE — Telephone Encounter (Signed)
Needs annual physical appointment for additional refills.  Request denied.

## 2023-07-13 DIAGNOSIS — C181 Malignant neoplasm of appendix: Principal | ICD-10-CM

## 2023-07-13 DIAGNOSIS — C786 Secondary malignant neoplasm of retroperitoneum and peritoneum: Principal | ICD-10-CM

## 2023-07-13 LAB — MISC LABCORP TEST (SEND OUT): Labcorp test code: 9985

## 2023-07-15 ENCOUNTER — Ambulatory Visit
Admit: 2023-07-15 | Discharge: 2023-07-16 | Payer: BLUE CROSS/BLUE SHIELD | Attending: Hematology & Oncology | Primary: Hematology & Oncology

## 2023-07-15 DIAGNOSIS — C181 Malignant neoplasm of appendix: Principal | ICD-10-CM

## 2023-07-15 DIAGNOSIS — C786 Secondary malignant neoplasm of retroperitoneum and peritoneum: Principal | ICD-10-CM

## 2023-07-15 DIAGNOSIS — I2510 Atherosclerotic heart disease of native coronary artery without angina pectoris: Secondary | ICD-10-CM | POA: Diagnosis not present

## 2023-07-15 DIAGNOSIS — I10 Essential (primary) hypertension: Secondary | ICD-10-CM | POA: Diagnosis not present

## 2023-07-15 DIAGNOSIS — E039 Hypothyroidism, unspecified: Secondary | ICD-10-CM | POA: Diagnosis not present

## 2023-07-15 DIAGNOSIS — Z87891 Personal history of nicotine dependence: Secondary | ICD-10-CM | POA: Diagnosis not present

## 2023-07-15 DIAGNOSIS — I69354 Hemiplegia and hemiparesis following cerebral infarction affecting left non-dominant side: Secondary | ICD-10-CM | POA: Diagnosis not present

## 2023-07-15 MED ORDER — PROCHLORPERAZINE MALEATE 10 MG TABLET
ORAL_TABLET | 0 refills | 0.00 days
Start: 2023-07-15 — End: ?

## 2023-07-15 MED ORDER — DEXAMETHASONE 2 MG TABLET
ORAL_TABLET | Freq: Every day | ORAL | 4 refills | 30.00 days | Status: CP
Start: 2023-07-15 — End: ?

## 2023-07-16 DIAGNOSIS — C786 Secondary malignant neoplasm of retroperitoneum and peritoneum: Principal | ICD-10-CM

## 2023-07-16 DIAGNOSIS — C181 Malignant neoplasm of appendix: Principal | ICD-10-CM

## 2023-07-21 DIAGNOSIS — Z515 Encounter for palliative care: Secondary | ICD-10-CM | POA: Diagnosis not present

## 2023-07-21 DIAGNOSIS — I10 Essential (primary) hypertension: Secondary | ICD-10-CM | POA: Diagnosis not present

## 2023-07-21 DIAGNOSIS — E039 Hypothyroidism, unspecified: Secondary | ICD-10-CM | POA: Diagnosis not present

## 2023-07-21 DIAGNOSIS — C760 Malignant neoplasm of head, face and neck: Secondary | ICD-10-CM | POA: Diagnosis not present

## 2023-07-21 DIAGNOSIS — C786 Secondary malignant neoplasm of retroperitoneum and peritoneum: Secondary | ICD-10-CM | POA: Diagnosis not present

## 2023-07-21 DIAGNOSIS — I6930 Unspecified sequelae of cerebral infarction: Secondary | ICD-10-CM | POA: Diagnosis not present

## 2023-07-22 DIAGNOSIS — C786 Secondary malignant neoplasm of retroperitoneum and peritoneum: Principal | ICD-10-CM

## 2023-07-22 DIAGNOSIS — C181 Malignant neoplasm of appendix: Principal | ICD-10-CM

## 2023-07-22 DIAGNOSIS — I6930 Unspecified sequelae of cerebral infarction: Secondary | ICD-10-CM | POA: Diagnosis not present

## 2023-07-22 DIAGNOSIS — I10 Essential (primary) hypertension: Secondary | ICD-10-CM | POA: Diagnosis not present

## 2023-07-22 DIAGNOSIS — E039 Hypothyroidism, unspecified: Secondary | ICD-10-CM | POA: Diagnosis not present

## 2023-07-22 DIAGNOSIS — C760 Malignant neoplasm of head, face and neck: Secondary | ICD-10-CM | POA: Diagnosis not present

## 2023-07-22 DIAGNOSIS — Z515 Encounter for palliative care: Secondary | ICD-10-CM | POA: Diagnosis not present

## 2023-07-23 DIAGNOSIS — I6930 Unspecified sequelae of cerebral infarction: Secondary | ICD-10-CM | POA: Diagnosis not present

## 2023-07-23 DIAGNOSIS — E039 Hypothyroidism, unspecified: Secondary | ICD-10-CM | POA: Diagnosis not present

## 2023-07-23 DIAGNOSIS — Z515 Encounter for palliative care: Secondary | ICD-10-CM | POA: Diagnosis not present

## 2023-07-23 DIAGNOSIS — C760 Malignant neoplasm of head, face and neck: Secondary | ICD-10-CM | POA: Diagnosis not present

## 2023-07-23 DIAGNOSIS — C786 Secondary malignant neoplasm of retroperitoneum and peritoneum: Secondary | ICD-10-CM | POA: Diagnosis not present

## 2023-07-23 DIAGNOSIS — I10 Essential (primary) hypertension: Secondary | ICD-10-CM | POA: Diagnosis not present

## 2023-07-24 DIAGNOSIS — C786 Secondary malignant neoplasm of retroperitoneum and peritoneum: Secondary | ICD-10-CM | POA: Diagnosis not present

## 2023-07-24 DIAGNOSIS — Z515 Encounter for palliative care: Secondary | ICD-10-CM | POA: Diagnosis not present

## 2023-07-24 DIAGNOSIS — I10 Essential (primary) hypertension: Secondary | ICD-10-CM | POA: Diagnosis not present

## 2023-07-24 DIAGNOSIS — C760 Malignant neoplasm of head, face and neck: Secondary | ICD-10-CM | POA: Diagnosis not present

## 2023-07-24 DIAGNOSIS — E039 Hypothyroidism, unspecified: Secondary | ICD-10-CM | POA: Diagnosis not present

## 2023-07-24 DIAGNOSIS — I6930 Unspecified sequelae of cerebral infarction: Secondary | ICD-10-CM | POA: Diagnosis not present

## 2023-07-25 DIAGNOSIS — E039 Hypothyroidism, unspecified: Secondary | ICD-10-CM | POA: Diagnosis not present

## 2023-07-25 DIAGNOSIS — Z515 Encounter for palliative care: Secondary | ICD-10-CM | POA: Diagnosis not present

## 2023-07-25 DIAGNOSIS — C760 Malignant neoplasm of head, face and neck: Secondary | ICD-10-CM | POA: Diagnosis not present

## 2023-07-25 DIAGNOSIS — I10 Essential (primary) hypertension: Secondary | ICD-10-CM | POA: Diagnosis not present

## 2023-07-25 DIAGNOSIS — I6930 Unspecified sequelae of cerebral infarction: Secondary | ICD-10-CM | POA: Diagnosis not present

## 2023-07-25 DIAGNOSIS — C786 Secondary malignant neoplasm of retroperitoneum and peritoneum: Secondary | ICD-10-CM | POA: Diagnosis not present

## 2023-07-26 DIAGNOSIS — C181 Malignant neoplasm of appendix: Principal | ICD-10-CM

## 2023-07-26 DIAGNOSIS — C786 Secondary malignant neoplasm of retroperitoneum and peritoneum: Principal | ICD-10-CM

## 2023-07-26 DIAGNOSIS — E039 Hypothyroidism, unspecified: Secondary | ICD-10-CM | POA: Diagnosis not present

## 2023-07-26 DIAGNOSIS — Z515 Encounter for palliative care: Secondary | ICD-10-CM | POA: Diagnosis not present

## 2023-07-26 DIAGNOSIS — I10 Essential (primary) hypertension: Secondary | ICD-10-CM | POA: Diagnosis not present

## 2023-07-26 DIAGNOSIS — C760 Malignant neoplasm of head, face and neck: Secondary | ICD-10-CM | POA: Diagnosis not present

## 2023-07-26 DIAGNOSIS — I6930 Unspecified sequelae of cerebral infarction: Secondary | ICD-10-CM | POA: Diagnosis not present

## 2023-07-26 MED ORDER — HYDROMORPHONE 4 MG TABLET
ORAL_TABLET | ORAL | 0 refills | 15.00 days | PRN
Start: 2023-07-26 — End: ?

## 2023-07-27 ENCOUNTER — Other Ambulatory Visit: Payer: Self-pay | Admitting: Family Medicine

## 2023-07-27 DIAGNOSIS — C181 Malignant neoplasm of appendix: Principal | ICD-10-CM

## 2023-07-27 DIAGNOSIS — C786 Secondary malignant neoplasm of retroperitoneum and peritoneum: Principal | ICD-10-CM

## 2023-07-27 DIAGNOSIS — C760 Malignant neoplasm of head, face and neck: Secondary | ICD-10-CM | POA: Diagnosis not present

## 2023-07-27 DIAGNOSIS — E781 Pure hyperglyceridemia: Secondary | ICD-10-CM

## 2023-07-27 DIAGNOSIS — Z515 Encounter for palliative care: Secondary | ICD-10-CM | POA: Diagnosis not present

## 2023-07-27 DIAGNOSIS — I10 Essential (primary) hypertension: Secondary | ICD-10-CM | POA: Diagnosis not present

## 2023-07-27 DIAGNOSIS — I6930 Unspecified sequelae of cerebral infarction: Secondary | ICD-10-CM | POA: Diagnosis not present

## 2023-07-27 DIAGNOSIS — E039 Hypothyroidism, unspecified: Secondary | ICD-10-CM | POA: Diagnosis not present

## 2023-07-27 MED ORDER — HYDROMORPHONE 4 MG TABLET
ORAL_TABLET | ORAL | 0 refills | 15.00 days | PRN
Start: 2023-07-27 — End: ?

## 2023-07-27 NOTE — Telephone Encounter (Signed)
E-scribed refill  Plz schedule CPE and fasting lab (no food/drink- except water and/or blk coffee 5 hrs prior) visits for additional refills.  

## 2023-07-28 DIAGNOSIS — I10 Essential (primary) hypertension: Secondary | ICD-10-CM | POA: Diagnosis not present

## 2023-07-28 DIAGNOSIS — Z515 Encounter for palliative care: Secondary | ICD-10-CM | POA: Diagnosis not present

## 2023-07-28 DIAGNOSIS — I6930 Unspecified sequelae of cerebral infarction: Secondary | ICD-10-CM | POA: Diagnosis not present

## 2023-07-28 DIAGNOSIS — C786 Secondary malignant neoplasm of retroperitoneum and peritoneum: Secondary | ICD-10-CM | POA: Diagnosis not present

## 2023-07-28 DIAGNOSIS — E039 Hypothyroidism, unspecified: Secondary | ICD-10-CM | POA: Diagnosis not present

## 2023-07-28 DIAGNOSIS — C760 Malignant neoplasm of head, face and neck: Secondary | ICD-10-CM | POA: Diagnosis not present

## 2023-07-29 DIAGNOSIS — C760 Malignant neoplasm of head, face and neck: Secondary | ICD-10-CM | POA: Diagnosis not present

## 2023-07-29 DIAGNOSIS — C786 Secondary malignant neoplasm of retroperitoneum and peritoneum: Secondary | ICD-10-CM | POA: Diagnosis not present

## 2023-07-29 DIAGNOSIS — Z515 Encounter for palliative care: Secondary | ICD-10-CM | POA: Diagnosis not present

## 2023-07-29 DIAGNOSIS — I10 Essential (primary) hypertension: Secondary | ICD-10-CM | POA: Diagnosis not present

## 2023-07-29 DIAGNOSIS — I6930 Unspecified sequelae of cerebral infarction: Secondary | ICD-10-CM | POA: Diagnosis not present

## 2023-07-29 DIAGNOSIS — E039 Hypothyroidism, unspecified: Secondary | ICD-10-CM | POA: Diagnosis not present

## 2023-07-30 DIAGNOSIS — I10 Essential (primary) hypertension: Secondary | ICD-10-CM | POA: Diagnosis not present

## 2023-07-30 DIAGNOSIS — C760 Malignant neoplasm of head, face and neck: Secondary | ICD-10-CM | POA: Diagnosis not present

## 2023-07-30 DIAGNOSIS — Z515 Encounter for palliative care: Secondary | ICD-10-CM | POA: Diagnosis not present

## 2023-07-30 DIAGNOSIS — I6930 Unspecified sequelae of cerebral infarction: Secondary | ICD-10-CM | POA: Diagnosis not present

## 2023-07-30 DIAGNOSIS — C786 Secondary malignant neoplasm of retroperitoneum and peritoneum: Secondary | ICD-10-CM | POA: Diagnosis not present

## 2023-07-30 DIAGNOSIS — E039 Hypothyroidism, unspecified: Secondary | ICD-10-CM | POA: Diagnosis not present

## 2023-07-31 DIAGNOSIS — I6930 Unspecified sequelae of cerebral infarction: Secondary | ICD-10-CM | POA: Diagnosis not present

## 2023-07-31 DIAGNOSIS — E039 Hypothyroidism, unspecified: Secondary | ICD-10-CM | POA: Diagnosis not present

## 2023-07-31 DIAGNOSIS — I10 Essential (primary) hypertension: Secondary | ICD-10-CM | POA: Diagnosis not present

## 2023-07-31 DIAGNOSIS — Z515 Encounter for palliative care: Secondary | ICD-10-CM | POA: Diagnosis not present

## 2023-07-31 DIAGNOSIS — C786 Secondary malignant neoplasm of retroperitoneum and peritoneum: Secondary | ICD-10-CM | POA: Diagnosis not present

## 2023-07-31 DIAGNOSIS — C760 Malignant neoplasm of head, face and neck: Secondary | ICD-10-CM | POA: Diagnosis not present

## 2023-08-01 DIAGNOSIS — E039 Hypothyroidism, unspecified: Secondary | ICD-10-CM | POA: Diagnosis not present

## 2023-08-01 DIAGNOSIS — C786 Secondary malignant neoplasm of retroperitoneum and peritoneum: Secondary | ICD-10-CM | POA: Diagnosis not present

## 2023-08-01 DIAGNOSIS — I6930 Unspecified sequelae of cerebral infarction: Secondary | ICD-10-CM | POA: Diagnosis not present

## 2023-08-01 DIAGNOSIS — Z515 Encounter for palliative care: Secondary | ICD-10-CM | POA: Diagnosis not present

## 2023-08-01 DIAGNOSIS — C760 Malignant neoplasm of head, face and neck: Secondary | ICD-10-CM | POA: Diagnosis not present

## 2023-08-01 DIAGNOSIS — I10 Essential (primary) hypertension: Secondary | ICD-10-CM | POA: Diagnosis not present

## 2023-08-02 DIAGNOSIS — I10 Essential (primary) hypertension: Secondary | ICD-10-CM | POA: Diagnosis not present

## 2023-08-02 DIAGNOSIS — C760 Malignant neoplasm of head, face and neck: Secondary | ICD-10-CM | POA: Diagnosis not present

## 2023-08-02 DIAGNOSIS — C786 Secondary malignant neoplasm of retroperitoneum and peritoneum: Secondary | ICD-10-CM | POA: Diagnosis not present

## 2023-08-02 DIAGNOSIS — Z515 Encounter for palliative care: Secondary | ICD-10-CM | POA: Diagnosis not present

## 2023-08-02 DIAGNOSIS — E039 Hypothyroidism, unspecified: Secondary | ICD-10-CM | POA: Diagnosis not present

## 2023-08-02 DIAGNOSIS — I6930 Unspecified sequelae of cerebral infarction: Secondary | ICD-10-CM | POA: Diagnosis not present

## 2023-08-03 DIAGNOSIS — C181 Malignant neoplasm of appendix: Principal | ICD-10-CM

## 2023-08-03 DIAGNOSIS — C786 Secondary malignant neoplasm of retroperitoneum and peritoneum: Principal | ICD-10-CM

## 2023-08-03 DIAGNOSIS — I6930 Unspecified sequelae of cerebral infarction: Secondary | ICD-10-CM | POA: Diagnosis not present

## 2023-08-03 DIAGNOSIS — E039 Hypothyroidism, unspecified: Secondary | ICD-10-CM | POA: Diagnosis not present

## 2023-08-03 DIAGNOSIS — C760 Malignant neoplasm of head, face and neck: Secondary | ICD-10-CM | POA: Diagnosis not present

## 2023-08-03 DIAGNOSIS — I10 Essential (primary) hypertension: Secondary | ICD-10-CM | POA: Diagnosis not present

## 2023-08-03 DIAGNOSIS — Z515 Encounter for palliative care: Secondary | ICD-10-CM | POA: Diagnosis not present

## 2023-08-03 MED ORDER — DEXAMETHASONE 2 MG TABLET
ORAL_TABLET | Freq: Every day | ORAL | 4 refills | 30.00 days
Start: 2023-08-03 — End: ?

## 2023-08-04 DIAGNOSIS — I6930 Unspecified sequelae of cerebral infarction: Secondary | ICD-10-CM | POA: Diagnosis not present

## 2023-08-04 DIAGNOSIS — E039 Hypothyroidism, unspecified: Secondary | ICD-10-CM | POA: Diagnosis not present

## 2023-08-04 DIAGNOSIS — Z515 Encounter for palliative care: Secondary | ICD-10-CM | POA: Diagnosis not present

## 2023-08-04 DIAGNOSIS — I10 Essential (primary) hypertension: Secondary | ICD-10-CM | POA: Diagnosis not present

## 2023-08-04 DIAGNOSIS — C760 Malignant neoplasm of head, face and neck: Secondary | ICD-10-CM | POA: Diagnosis not present

## 2023-08-04 DIAGNOSIS — C786 Secondary malignant neoplasm of retroperitoneum and peritoneum: Secondary | ICD-10-CM | POA: Diagnosis not present

## 2023-08-05 DIAGNOSIS — C760 Malignant neoplasm of head, face and neck: Secondary | ICD-10-CM | POA: Diagnosis not present

## 2023-08-05 DIAGNOSIS — E039 Hypothyroidism, unspecified: Secondary | ICD-10-CM | POA: Diagnosis not present

## 2023-08-05 DIAGNOSIS — I6930 Unspecified sequelae of cerebral infarction: Secondary | ICD-10-CM | POA: Diagnosis not present

## 2023-08-05 DIAGNOSIS — Z515 Encounter for palliative care: Secondary | ICD-10-CM | POA: Diagnosis not present

## 2023-08-05 DIAGNOSIS — I10 Essential (primary) hypertension: Secondary | ICD-10-CM | POA: Diagnosis not present

## 2023-08-05 DIAGNOSIS — C786 Secondary malignant neoplasm of retroperitoneum and peritoneum: Secondary | ICD-10-CM | POA: Diagnosis not present

## 2023-08-06 DIAGNOSIS — Z515 Encounter for palliative care: Secondary | ICD-10-CM | POA: Diagnosis not present

## 2023-08-06 DIAGNOSIS — I6930 Unspecified sequelae of cerebral infarction: Secondary | ICD-10-CM | POA: Diagnosis not present

## 2023-08-06 DIAGNOSIS — E039 Hypothyroidism, unspecified: Secondary | ICD-10-CM | POA: Diagnosis not present

## 2023-08-06 DIAGNOSIS — C760 Malignant neoplasm of head, face and neck: Secondary | ICD-10-CM | POA: Diagnosis not present

## 2023-08-06 DIAGNOSIS — I10 Essential (primary) hypertension: Secondary | ICD-10-CM | POA: Diagnosis not present

## 2023-08-06 DIAGNOSIS — C786 Secondary malignant neoplasm of retroperitoneum and peritoneum: Secondary | ICD-10-CM | POA: Diagnosis not present

## 2023-08-07 DIAGNOSIS — E039 Hypothyroidism, unspecified: Secondary | ICD-10-CM | POA: Diagnosis not present

## 2023-08-07 DIAGNOSIS — Z515 Encounter for palliative care: Secondary | ICD-10-CM | POA: Diagnosis not present

## 2023-08-07 DIAGNOSIS — I10 Essential (primary) hypertension: Secondary | ICD-10-CM | POA: Diagnosis not present

## 2023-08-07 DIAGNOSIS — C786 Secondary malignant neoplasm of retroperitoneum and peritoneum: Secondary | ICD-10-CM | POA: Diagnosis not present

## 2023-08-07 DIAGNOSIS — C760 Malignant neoplasm of head, face and neck: Secondary | ICD-10-CM | POA: Diagnosis not present

## 2023-08-07 DIAGNOSIS — I6930 Unspecified sequelae of cerebral infarction: Secondary | ICD-10-CM | POA: Diagnosis not present

## 2023-08-08 DIAGNOSIS — I10 Essential (primary) hypertension: Secondary | ICD-10-CM | POA: Diagnosis not present

## 2023-08-08 DIAGNOSIS — I6930 Unspecified sequelae of cerebral infarction: Secondary | ICD-10-CM | POA: Diagnosis not present

## 2023-08-08 DIAGNOSIS — Z515 Encounter for palliative care: Secondary | ICD-10-CM | POA: Diagnosis not present

## 2023-08-08 DIAGNOSIS — C786 Secondary malignant neoplasm of retroperitoneum and peritoneum: Secondary | ICD-10-CM | POA: Diagnosis not present

## 2023-08-08 DIAGNOSIS — E039 Hypothyroidism, unspecified: Secondary | ICD-10-CM | POA: Diagnosis not present

## 2023-08-08 DIAGNOSIS — C760 Malignant neoplasm of head, face and neck: Secondary | ICD-10-CM | POA: Diagnosis not present

## 2023-08-09 DIAGNOSIS — Z515 Encounter for palliative care: Secondary | ICD-10-CM | POA: Diagnosis not present

## 2023-08-09 DIAGNOSIS — C786 Secondary malignant neoplasm of retroperitoneum and peritoneum: Secondary | ICD-10-CM | POA: Diagnosis not present

## 2023-08-09 DIAGNOSIS — I10 Essential (primary) hypertension: Secondary | ICD-10-CM | POA: Diagnosis not present

## 2023-08-09 DIAGNOSIS — I6930 Unspecified sequelae of cerebral infarction: Secondary | ICD-10-CM | POA: Diagnosis not present

## 2023-08-09 DIAGNOSIS — E039 Hypothyroidism, unspecified: Secondary | ICD-10-CM | POA: Diagnosis not present

## 2023-08-09 DIAGNOSIS — C760 Malignant neoplasm of head, face and neck: Secondary | ICD-10-CM | POA: Diagnosis not present

## 2023-08-10 DIAGNOSIS — C786 Secondary malignant neoplasm of retroperitoneum and peritoneum: Secondary | ICD-10-CM | POA: Diagnosis not present

## 2023-08-10 DIAGNOSIS — I6930 Unspecified sequelae of cerebral infarction: Secondary | ICD-10-CM | POA: Diagnosis not present

## 2023-08-10 DIAGNOSIS — C760 Malignant neoplasm of head, face and neck: Secondary | ICD-10-CM | POA: Diagnosis not present

## 2023-08-10 DIAGNOSIS — Z515 Encounter for palliative care: Secondary | ICD-10-CM | POA: Diagnosis not present

## 2023-08-10 DIAGNOSIS — E039 Hypothyroidism, unspecified: Secondary | ICD-10-CM | POA: Diagnosis not present

## 2023-08-10 DIAGNOSIS — I10 Essential (primary) hypertension: Secondary | ICD-10-CM | POA: Diagnosis not present

## 2023-08-11 DIAGNOSIS — I6930 Unspecified sequelae of cerebral infarction: Secondary | ICD-10-CM | POA: Diagnosis not present

## 2023-08-11 DIAGNOSIS — I10 Essential (primary) hypertension: Secondary | ICD-10-CM | POA: Diagnosis not present

## 2023-08-11 DIAGNOSIS — C786 Secondary malignant neoplasm of retroperitoneum and peritoneum: Secondary | ICD-10-CM | POA: Diagnosis not present

## 2023-08-11 DIAGNOSIS — Z515 Encounter for palliative care: Secondary | ICD-10-CM | POA: Diagnosis not present

## 2023-08-11 DIAGNOSIS — E039 Hypothyroidism, unspecified: Secondary | ICD-10-CM | POA: Diagnosis not present

## 2023-08-11 DIAGNOSIS — C760 Malignant neoplasm of head, face and neck: Secondary | ICD-10-CM | POA: Diagnosis not present

## 2023-08-12 DIAGNOSIS — I6930 Unspecified sequelae of cerebral infarction: Secondary | ICD-10-CM | POA: Diagnosis not present

## 2023-08-12 DIAGNOSIS — C760 Malignant neoplasm of head, face and neck: Secondary | ICD-10-CM | POA: Diagnosis not present

## 2023-08-12 DIAGNOSIS — Z515 Encounter for palliative care: Secondary | ICD-10-CM | POA: Diagnosis not present

## 2023-08-12 DIAGNOSIS — C786 Secondary malignant neoplasm of retroperitoneum and peritoneum: Secondary | ICD-10-CM | POA: Diagnosis not present

## 2023-08-12 DIAGNOSIS — E039 Hypothyroidism, unspecified: Secondary | ICD-10-CM | POA: Diagnosis not present

## 2023-08-12 DIAGNOSIS — I10 Essential (primary) hypertension: Secondary | ICD-10-CM | POA: Diagnosis not present

## 2023-08-13 DIAGNOSIS — E039 Hypothyroidism, unspecified: Secondary | ICD-10-CM | POA: Diagnosis not present

## 2023-08-13 DIAGNOSIS — C760 Malignant neoplasm of head, face and neck: Secondary | ICD-10-CM | POA: Diagnosis not present

## 2023-08-13 DIAGNOSIS — I10 Essential (primary) hypertension: Secondary | ICD-10-CM | POA: Diagnosis not present

## 2023-08-13 DIAGNOSIS — Z515 Encounter for palliative care: Secondary | ICD-10-CM | POA: Diagnosis not present

## 2023-08-13 DIAGNOSIS — C786 Secondary malignant neoplasm of retroperitoneum and peritoneum: Secondary | ICD-10-CM | POA: Diagnosis not present

## 2023-08-13 DIAGNOSIS — I6930 Unspecified sequelae of cerebral infarction: Secondary | ICD-10-CM | POA: Diagnosis not present

## 2023-08-14 DIAGNOSIS — C786 Secondary malignant neoplasm of retroperitoneum and peritoneum: Secondary | ICD-10-CM | POA: Diagnosis not present

## 2023-08-14 DIAGNOSIS — I10 Essential (primary) hypertension: Secondary | ICD-10-CM | POA: Diagnosis not present

## 2023-08-14 DIAGNOSIS — C760 Malignant neoplasm of head, face and neck: Secondary | ICD-10-CM | POA: Diagnosis not present

## 2023-08-14 DIAGNOSIS — E039 Hypothyroidism, unspecified: Secondary | ICD-10-CM | POA: Diagnosis not present

## 2023-08-14 DIAGNOSIS — I6930 Unspecified sequelae of cerebral infarction: Secondary | ICD-10-CM | POA: Diagnosis not present

## 2023-08-14 DIAGNOSIS — Z515 Encounter for palliative care: Secondary | ICD-10-CM | POA: Diagnosis not present

## 2023-08-15 DIAGNOSIS — C786 Secondary malignant neoplasm of retroperitoneum and peritoneum: Secondary | ICD-10-CM | POA: Diagnosis not present

## 2023-08-15 DIAGNOSIS — C760 Malignant neoplasm of head, face and neck: Secondary | ICD-10-CM | POA: Diagnosis not present

## 2023-08-15 DIAGNOSIS — E039 Hypothyroidism, unspecified: Secondary | ICD-10-CM | POA: Diagnosis not present

## 2023-08-15 DIAGNOSIS — I10 Essential (primary) hypertension: Secondary | ICD-10-CM | POA: Diagnosis not present

## 2023-08-15 DIAGNOSIS — I6930 Unspecified sequelae of cerebral infarction: Secondary | ICD-10-CM | POA: Diagnosis not present

## 2023-08-15 DIAGNOSIS — Z515 Encounter for palliative care: Secondary | ICD-10-CM | POA: Diagnosis not present

## 2023-08-16 DIAGNOSIS — C760 Malignant neoplasm of head, face and neck: Secondary | ICD-10-CM | POA: Diagnosis not present

## 2023-08-16 DIAGNOSIS — C786 Secondary malignant neoplasm of retroperitoneum and peritoneum: Secondary | ICD-10-CM | POA: Diagnosis not present

## 2023-08-16 DIAGNOSIS — Z515 Encounter for palliative care: Secondary | ICD-10-CM | POA: Diagnosis not present

## 2023-08-16 DIAGNOSIS — E039 Hypothyroidism, unspecified: Secondary | ICD-10-CM | POA: Diagnosis not present

## 2023-08-16 DIAGNOSIS — I6930 Unspecified sequelae of cerebral infarction: Secondary | ICD-10-CM | POA: Diagnosis not present

## 2023-08-16 DIAGNOSIS — I10 Essential (primary) hypertension: Secondary | ICD-10-CM | POA: Diagnosis not present

## 2023-08-17 DIAGNOSIS — C181 Malignant neoplasm of appendix: Principal | ICD-10-CM

## 2023-08-17 DIAGNOSIS — C786 Secondary malignant neoplasm of retroperitoneum and peritoneum: Principal | ICD-10-CM

## 2023-08-17 DIAGNOSIS — C760 Malignant neoplasm of head, face and neck: Secondary | ICD-10-CM | POA: Diagnosis not present

## 2023-08-17 DIAGNOSIS — E039 Hypothyroidism, unspecified: Secondary | ICD-10-CM | POA: Diagnosis not present

## 2023-08-17 DIAGNOSIS — Z515 Encounter for palliative care: Secondary | ICD-10-CM | POA: Diagnosis not present

## 2023-08-17 DIAGNOSIS — I6930 Unspecified sequelae of cerebral infarction: Secondary | ICD-10-CM | POA: Diagnosis not present

## 2023-08-17 DIAGNOSIS — I10 Essential (primary) hypertension: Secondary | ICD-10-CM | POA: Diagnosis not present

## 2023-08-18 ENCOUNTER — Inpatient Hospital Stay: Payer: BC Managed Care – PPO

## 2023-08-18 ENCOUNTER — Emergency Department: Payer: BC Managed Care – PPO

## 2023-08-18 ENCOUNTER — Encounter: Payer: Self-pay | Admitting: Emergency Medicine

## 2023-08-18 ENCOUNTER — Inpatient Hospital Stay
Admission: EM | Admit: 2023-08-18 | Discharge: 2023-08-27 | DRG: 871 | Disposition: A | Discharge status: Home Health | Payer: BC Managed Care – PPO | Attending: Osteopathic Medicine | Admitting: Osteopathic Medicine

## 2023-08-18 ENCOUNTER — Other Ambulatory Visit: Payer: Self-pay

## 2023-08-18 DIAGNOSIS — C181 Malignant neoplasm of appendix: Principal | ICD-10-CM

## 2023-08-18 DIAGNOSIS — C786 Secondary malignant neoplasm of retroperitoneum and peritoneum: Principal | ICD-10-CM

## 2023-08-18 DIAGNOSIS — R64 Cachexia: Secondary | ICD-10-CM | POA: Diagnosis present

## 2023-08-18 DIAGNOSIS — I951 Orthostatic hypotension: Secondary | ICD-10-CM | POA: Diagnosis not present

## 2023-08-18 DIAGNOSIS — J439 Emphysema, unspecified: Secondary | ICD-10-CM | POA: Diagnosis not present

## 2023-08-18 DIAGNOSIS — E785 Hyperlipidemia, unspecified: Secondary | ICD-10-CM | POA: Diagnosis present

## 2023-08-18 DIAGNOSIS — Z8673 Personal history of transient ischemic attack (TIA), and cerebral infarction without residual deficits: Secondary | ICD-10-CM

## 2023-08-18 DIAGNOSIS — G8929 Other chronic pain: Secondary | ICD-10-CM | POA: Diagnosis present

## 2023-08-18 DIAGNOSIS — R579 Shock, unspecified: Secondary | ICD-10-CM

## 2023-08-18 DIAGNOSIS — D638 Anemia in other chronic diseases classified elsewhere: Secondary | ICD-10-CM | POA: Diagnosis not present

## 2023-08-18 DIAGNOSIS — A419 Sepsis, unspecified organism: Secondary | ICD-10-CM | POA: Diagnosis not present

## 2023-08-18 DIAGNOSIS — E872 Acidosis, unspecified: Secondary | ICD-10-CM | POA: Diagnosis not present

## 2023-08-18 DIAGNOSIS — E039 Hypothyroidism, unspecified: Secondary | ICD-10-CM | POA: Diagnosis not present

## 2023-08-18 DIAGNOSIS — E43 Unspecified severe protein-calorie malnutrition: Secondary | ICD-10-CM | POA: Diagnosis present

## 2023-08-18 DIAGNOSIS — R6521 Severe sepsis with septic shock: Secondary | ICD-10-CM | POA: Diagnosis not present

## 2023-08-18 DIAGNOSIS — M109 Gout, unspecified: Secondary | ICD-10-CM | POA: Diagnosis present

## 2023-08-18 DIAGNOSIS — R051 Acute cough: Secondary | ICD-10-CM

## 2023-08-18 DIAGNOSIS — I110 Hypertensive heart disease with heart failure: Secondary | ICD-10-CM | POA: Diagnosis not present

## 2023-08-18 DIAGNOSIS — I10 Essential (primary) hypertension: Secondary | ICD-10-CM | POA: Diagnosis not present

## 2023-08-18 DIAGNOSIS — R109 Unspecified abdominal pain: Secondary | ICD-10-CM | POA: Diagnosis not present

## 2023-08-18 DIAGNOSIS — R339 Retention of urine, unspecified: Secondary | ICD-10-CM | POA: Diagnosis present

## 2023-08-18 DIAGNOSIS — Z85819 Personal history of malignant neoplasm of unspecified site of lip, oral cavity, and pharynx: Secondary | ICD-10-CM

## 2023-08-18 DIAGNOSIS — R0602 Shortness of breath: Secondary | ICD-10-CM | POA: Diagnosis not present

## 2023-08-18 DIAGNOSIS — I959 Hypotension, unspecified: Secondary | ICD-10-CM | POA: Diagnosis not present

## 2023-08-18 DIAGNOSIS — C189 Malignant neoplasm of colon, unspecified: Secondary | ICD-10-CM | POA: Diagnosis not present

## 2023-08-18 DIAGNOSIS — I2510 Atherosclerotic heart disease of native coronary artery without angina pectoris: Secondary | ICD-10-CM | POA: Diagnosis present

## 2023-08-18 DIAGNOSIS — Z4682 Encounter for fitting and adjustment of non-vascular catheter: Secondary | ICD-10-CM | POA: Diagnosis not present

## 2023-08-18 DIAGNOSIS — K6389 Other specified diseases of intestine: Secondary | ICD-10-CM | POA: Diagnosis not present

## 2023-08-18 DIAGNOSIS — E876 Hypokalemia: Secondary | ICD-10-CM | POA: Diagnosis present

## 2023-08-18 DIAGNOSIS — K219 Gastro-esophageal reflux disease without esophagitis: Secondary | ICD-10-CM | POA: Diagnosis present

## 2023-08-18 DIAGNOSIS — E878 Other disorders of electrolyte and fluid balance, not elsewhere classified: Secondary | ICD-10-CM | POA: Diagnosis not present

## 2023-08-18 DIAGNOSIS — J9601 Acute respiratory failure with hypoxia: Secondary | ICD-10-CM | POA: Diagnosis not present

## 2023-08-18 DIAGNOSIS — N179 Acute kidney failure, unspecified: Secondary | ICD-10-CM | POA: Diagnosis present

## 2023-08-18 DIAGNOSIS — R338 Other retention of urine: Secondary | ICD-10-CM

## 2023-08-18 DIAGNOSIS — Z9221 Personal history of antineoplastic chemotherapy: Secondary | ICD-10-CM

## 2023-08-18 DIAGNOSIS — R0902 Hypoxemia: Secondary | ICD-10-CM | POA: Diagnosis not present

## 2023-08-18 DIAGNOSIS — C61 Malignant neoplasm of prostate: Secondary | ICD-10-CM | POA: Diagnosis not present

## 2023-08-18 DIAGNOSIS — J440 Chronic obstructive pulmonary disease with acute lower respiratory infection: Secondary | ICD-10-CM | POA: Diagnosis not present

## 2023-08-18 DIAGNOSIS — I6782 Cerebral ischemia: Secondary | ICD-10-CM | POA: Diagnosis not present

## 2023-08-18 DIAGNOSIS — Z515 Encounter for palliative care: Secondary | ICD-10-CM

## 2023-08-18 DIAGNOSIS — Z1152 Encounter for screening for COVID-19: Secondary | ICD-10-CM

## 2023-08-18 DIAGNOSIS — E871 Hypo-osmolality and hyponatremia: Secondary | ICD-10-CM | POA: Diagnosis present

## 2023-08-18 DIAGNOSIS — J189 Pneumonia, unspecified organism: Secondary | ICD-10-CM | POA: Diagnosis not present

## 2023-08-18 DIAGNOSIS — Z85038 Personal history of other malignant neoplasm of large intestine: Secondary | ICD-10-CM

## 2023-08-18 DIAGNOSIS — R112 Nausea with vomiting, unspecified: Secondary | ICD-10-CM | POA: Diagnosis not present

## 2023-08-18 DIAGNOSIS — G629 Polyneuropathy, unspecified: Secondary | ICD-10-CM | POA: Diagnosis present

## 2023-08-18 DIAGNOSIS — Z859 Personal history of malignant neoplasm, unspecified: Secondary | ICD-10-CM

## 2023-08-18 DIAGNOSIS — Z885 Allergy status to narcotic agent status: Secondary | ICD-10-CM

## 2023-08-18 DIAGNOSIS — R000 Tachycardia, unspecified: Secondary | ICD-10-CM | POA: Diagnosis not present

## 2023-08-18 DIAGNOSIS — Z8589 Personal history of malignant neoplasm of other organs and systems: Secondary | ICD-10-CM

## 2023-08-18 DIAGNOSIS — C760 Malignant neoplasm of head, face and neck: Secondary | ICD-10-CM | POA: Diagnosis not present

## 2023-08-18 DIAGNOSIS — R1084 Generalized abdominal pain: Secondary | ICD-10-CM | POA: Diagnosis not present

## 2023-08-18 DIAGNOSIS — I5032 Chronic diastolic (congestive) heart failure: Secondary | ICD-10-CM | POA: Diagnosis not present

## 2023-08-18 DIAGNOSIS — K5900 Constipation, unspecified: Secondary | ICD-10-CM | POA: Diagnosis present

## 2023-08-18 DIAGNOSIS — Z6820 Body mass index (BMI) 20.0-20.9, adult: Secondary | ICD-10-CM

## 2023-08-18 DIAGNOSIS — R918 Other nonspecific abnormal finding of lung field: Secondary | ICD-10-CM | POA: Diagnosis not present

## 2023-08-18 DIAGNOSIS — I6930 Unspecified sequelae of cerebral infarction: Secondary | ICD-10-CM | POA: Diagnosis not present

## 2023-08-18 DIAGNOSIS — R531 Weakness: Secondary | ICD-10-CM | POA: Diagnosis not present

## 2023-08-18 DIAGNOSIS — J168 Pneumonia due to other specified infectious organisms: Secondary | ICD-10-CM | POA: Diagnosis not present

## 2023-08-18 DIAGNOSIS — J90 Pleural effusion, not elsewhere classified: Secondary | ICD-10-CM | POA: Diagnosis not present

## 2023-08-18 DIAGNOSIS — Z96653 Presence of artificial knee joint, bilateral: Secondary | ICD-10-CM | POA: Diagnosis present

## 2023-08-18 DIAGNOSIS — Z79899 Other long term (current) drug therapy: Secondary | ICD-10-CM

## 2023-08-18 DIAGNOSIS — Z87891 Personal history of nicotine dependence: Secondary | ICD-10-CM

## 2023-08-18 DIAGNOSIS — Z9049 Acquired absence of other specified parts of digestive tract: Secondary | ICD-10-CM

## 2023-08-18 LAB — URINALYSIS, W/ REFLEX TO CULTURE (INFECTION SUSPECTED)
Bilirubin Urine: NEGATIVE
Glucose, UA: NEGATIVE mg/dL
Ketones, ur: NEGATIVE mg/dL
Leukocytes,Ua: NEGATIVE
Nitrite: NEGATIVE
Protein, ur: NEGATIVE mg/dL
Specific Gravity, Urine: 1.02 (ref 1.005–1.030)
Squamous Epithelial / HPF: 0 /[HPF] (ref 0–5)
pH: 5 (ref 5.0–8.0)

## 2023-08-18 LAB — CBC WITH DIFFERENTIAL/PLATELET
Abs Immature Granulocytes: 0.07 10*3/uL (ref 0.00–0.07)
Basophils Absolute: 0 10*3/uL (ref 0.0–0.1)
Basophils Relative: 0 %
Eosinophils Absolute: 0 10*3/uL (ref 0.0–0.5)
Eosinophils Relative: 0 %
HCT: 37.4 % — ABNORMAL LOW (ref 39.0–52.0)
Hemoglobin: 12.5 g/dL — ABNORMAL LOW (ref 13.0–17.0)
Immature Granulocytes: 0 %
Lymphocytes Relative: 2 %
Lymphs Abs: 0.4 10*3/uL — ABNORMAL LOW (ref 0.7–4.0)
MCH: 25.9 pg — ABNORMAL LOW (ref 26.0–34.0)
MCHC: 33.4 g/dL (ref 30.0–36.0)
MCV: 77.4 fL — ABNORMAL LOW (ref 80.0–100.0)
Monocytes Absolute: 0.5 10*3/uL (ref 0.1–1.0)
Monocytes Relative: 3 %
Neutro Abs: 15.2 10*3/uL — ABNORMAL HIGH (ref 1.7–7.7)
Neutrophils Relative %: 95 %
Platelets: 357 10*3/uL (ref 150–400)
RBC: 4.83 MIL/uL (ref 4.22–5.81)
RDW: 16.2 % — ABNORMAL HIGH (ref 11.5–15.5)
WBC: 16.2 10*3/uL — ABNORMAL HIGH (ref 4.0–10.5)
nRBC: 0 % (ref 0.0–0.2)

## 2023-08-18 LAB — BLOOD GAS, ARTERIAL
Acid-base deficit: 1.1 mmol/L (ref 0.0–2.0)
Bicarbonate: 22.6 mmol/L (ref 20.0–28.0)
O2 Content: 6 L/min
O2 Saturation: 89.5 %
Patient temperature: 37
pCO2 arterial: 34 mm[Hg] (ref 32–48)
pH, Arterial: 7.43 (ref 7.35–7.45)
pO2, Arterial: 61 mm[Hg] — ABNORMAL LOW (ref 83–108)

## 2023-08-18 LAB — COMPREHENSIVE METABOLIC PANEL
ALT: 9 U/L (ref 0–44)
AST: 23 U/L (ref 15–41)
Albumin: 3.3 g/dL — ABNORMAL LOW (ref 3.5–5.0)
Alkaline Phosphatase: 61 U/L (ref 38–126)
Anion gap: 20 — ABNORMAL HIGH (ref 5–15)
BUN: 88 mg/dL — ABNORMAL HIGH (ref 8–23)
CO2: 21 mmol/L — ABNORMAL LOW (ref 22–32)
Calcium: 8.7 mg/dL — ABNORMAL LOW (ref 8.9–10.3)
Chloride: 81 mmol/L — ABNORMAL LOW (ref 98–111)
Creatinine, Ser: 2.52 mg/dL — ABNORMAL HIGH (ref 0.61–1.24)
GFR, Estimated: 28 mL/min — ABNORMAL LOW (ref >60)
Glucose, Bld: 117 mg/dL — ABNORMAL HIGH (ref 70–99)
Potassium: 3.6 mmol/L (ref 3.5–5.1)
Sodium: 122 mmol/L — ABNORMAL LOW (ref 135–145)
Total Bilirubin: 0.8 mg/dL (ref 0.0–1.2)
Total Protein: 7.6 g/dL (ref 6.5–8.1)

## 2023-08-18 LAB — TROPONIN I (HIGH SENSITIVITY)
Troponin I (High Sensitivity): 12 ng/L (ref <18)
Troponin I (High Sensitivity): 12 ng/L (ref <18)

## 2023-08-18 LAB — APTT: aPTT: 54 s — ABNORMAL HIGH (ref 24–36)

## 2023-08-18 LAB — RESP PANEL BY RT-PCR (RSV, FLU A&B, COVID)  RVPGX2
Influenza A by PCR: NEGATIVE
Influenza B by PCR: NEGATIVE
Resp Syncytial Virus by PCR: NEGATIVE
SARS Coronavirus 2 by RT PCR: NEGATIVE

## 2023-08-18 LAB — PROTIME-INR
INR: 2.3 — ABNORMAL HIGH (ref 0.8–1.2)
Prothrombin Time: 25.9 s — ABNORMAL HIGH (ref 11.4–15.2)

## 2023-08-18 LAB — GLUCOSE, CAPILLARY: Glucose-Capillary: 115 mg/dL — ABNORMAL HIGH (ref 70–99)

## 2023-08-18 LAB — LACTIC ACID, PLASMA
Lactic Acid, Venous: 4.3 mmol/L (ref 0.5–1.9)
Lactic Acid, Venous: 6.7 mmol/L (ref 0.5–1.9)
Lactic Acid, Venous: 3.8 mmol/L (ref 0.5–1.9)
Lactic Acid, Venous: 1.6 mmol/L (ref 0.5–1.9)

## 2023-08-18 LAB — BRAIN NATRIURETIC PEPTIDE: B Natriuretic Peptide: 38.1 pg/mL (ref 0.0–100.0)

## 2023-08-18 LAB — MRSA NEXT GEN BY PCR, NASAL: MRSA by PCR Next Gen: NOT DETECTED

## 2023-08-18 LAB — BLOOD GAS, VENOUS

## 2023-08-18 LAB — STREP PNEUMONIAE URINARY ANTIGEN: Strep Pneumo Urinary Antigen: NEGATIVE

## 2023-08-18 MED ORDER — SODIUM CHLORIDE 0.9 % IV SOLN
2.0000 g | Freq: Two times a day (BID) | INTRAVENOUS | Status: DC
  Administered 2023-08-19 (×3): 2 g via INTRAVENOUS
  Filled 2023-08-18 (×4): qty 12.5

## 2023-08-18 MED ORDER — LACTATED RINGERS IV SOLN: INTRAVENOUS | Status: DC

## 2023-08-18 MED ORDER — SODIUM CHLORIDE 0.9 % IV SOLN
500.0000 mg | INTRAVENOUS | Status: AC
  Administered 2023-08-18 – 2023-08-20 (×3): 500 mg via INTRAVENOUS
  Filled 2023-08-18 (×4): qty 5

## 2023-08-18 MED ORDER — LACTATED RINGERS IV BOLUS (SEPSIS): 1000.0000 mL | Freq: Once | INTRAVENOUS | Status: DC

## 2023-08-18 MED ORDER — SODIUM CHLORIDE 0.9 % IV SOLN
2.0000 g | Freq: Once | INTRAVENOUS | Status: AC
  Administered 2023-08-18: 2 g via INTRAVENOUS
  Filled 2023-08-18: qty 12.5

## 2023-08-18 MED ORDER — BISACODYL 10 MG RE SUPP
10.0000 mg | Freq: Once | RECTAL | Status: AC
  Administered 2023-08-18: 10 mg via RECTAL
  Filled 2023-08-18: qty 1

## 2023-08-18 MED ORDER — CHLORHEXIDINE GLUCONATE CLOTH 2 % EX PADS
6.0000 | MEDICATED_PAD | Freq: Every day | CUTANEOUS | Status: DC
  Administered 2023-08-18 – 2023-08-22 (×5): 6 via TOPICAL
  Filled 2023-08-18: qty 6

## 2023-08-18 MED ORDER — VANCOMYCIN HCL IN DEXTROSE 1-5 GM/200ML-% IV SOLN
1000.0000 mg | Freq: Once | INTRAVENOUS | Status: AC
  Administered 2023-08-18: 1000 mg via INTRAVENOUS
  Filled 2023-08-18: qty 200

## 2023-08-18 MED ORDER — DOCUSATE SODIUM 100 MG PO CAPS: 100.0000 mg | ORAL_CAPSULE | Freq: Two times a day (BID) | ORAL | Status: DC | PRN

## 2023-08-18 MED ORDER — METRONIDAZOLE 500 MG/100ML IV SOLN
500.0000 mg | Freq: Two times a day (BID) | INTRAVENOUS | Status: DC
Start: 2023-08-18 — End: 2023-08-23
  Administered 2023-08-18 – 2023-08-23 (×10): 500 mg via INTRAVENOUS
  Filled 2023-08-18 (×14): qty 100

## 2023-08-18 MED ORDER — FENTANYL CITRATE PF 50 MCG/ML IJ SOSY
25.0000 ug | PREFILLED_SYRINGE | Freq: Once | INTRAMUSCULAR | Status: AC
  Administered 2023-08-18: 25 ug via INTRAVENOUS
  Filled 2023-08-18: qty 1

## 2023-08-18 MED ORDER — LACTATED RINGERS IV BOLUS
500.0000 mL | Freq: Once | INTRAVENOUS | Status: AC
  Administered 2023-08-18: 500 mL via INTRAVENOUS

## 2023-08-18 MED ORDER — PANTOPRAZOLE SODIUM 40 MG IV SOLR
40.0000 mg | INTRAVENOUS | Status: DC
  Administered 2023-08-18 – 2023-08-19 (×2): 40 mg via INTRAVENOUS
  Filled 2023-08-18 (×2): qty 10

## 2023-08-18 MED ORDER — VANCOMYCIN HCL 500 MG/100ML IV SOLN
500.0000 mg | Freq: Once | INTRAVENOUS | Status: AC
  Administered 2023-08-18: 500 mg via INTRAVENOUS
  Filled 2023-08-18: qty 100

## 2023-08-18 MED ORDER — VANCOMYCIN VARIABLE DOSE PER UNSTABLE RENAL FUNCTION (PHARMACIST DOSING): Status: DC

## 2023-08-18 MED ORDER — SODIUM CHLORIDE 0.9% FLUSH
10.0000 mL | Freq: Two times a day (BID) | INTRAVENOUS | Status: DC
  Administered 2023-08-19 – 2023-08-22 (×7): 10 mL

## 2023-08-18 MED ORDER — METRONIDAZOLE 500 MG/100ML IV SOLN
500.0000 mg | Freq: Once | INTRAVENOUS | Status: AC
  Administered 2023-08-18: 500 mg via INTRAVENOUS
  Filled 2023-08-18: qty 100

## 2023-08-18 MED ORDER — MORPHINE SULFATE (PF) 2 MG/ML IV SOLN: 2.0000 mg | INTRAVENOUS | Status: DC | PRN

## 2023-08-18 MED ORDER — DOCUSATE SODIUM 100 MG PO CAPS
100.0000 mg | ORAL_CAPSULE | Freq: Two times a day (BID) | ORAL | Status: DC
  Administered 2023-08-19 – 2023-08-27 (×10): 100 mg via ORAL
  Filled 2023-08-18 (×13): qty 1

## 2023-08-18 MED ORDER — POLYETHYLENE GLYCOL 3350 17 G PO PACK: 17.0000 g | PACK | Freq: Every day | ORAL | Status: DC | PRN

## 2023-08-18 MED ORDER — SODIUM CHLORIDE 0.9% FLUSH: 10.0000 mL | INTRAVENOUS | Status: DC | PRN

## 2023-08-18 MED ORDER — NOREPINEPHRINE 4 MG/250ML-% IV SOLN
0.0000 ug/min | INTRAVENOUS | Status: DC
  Administered 2023-08-18: 14 ug/min via INTRAVENOUS
  Administered 2023-08-18: 2 ug/min via INTRAVENOUS
  Filled 2023-08-18 (×2): qty 250

## 2023-08-18 MED ORDER — HEPARIN SODIUM (PORCINE) 5000 UNIT/ML IJ SOLN
5000.0000 [IU] | Freq: Three times a day (TID) | INTRAMUSCULAR | Status: DC
Start: 2023-08-18 — End: 2023-08-26
  Administered 2023-08-18 – 2023-08-26 (×23): 5000 [IU] via SUBCUTANEOUS
  Filled 2023-08-18 (×23): qty 1

## 2023-08-18 MED ORDER — LACTATED RINGERS IV BOLUS
1000.0000 mL | Freq: Once | INTRAVENOUS | Status: AC
  Administered 2023-08-18: 1000 mL via INTRAVENOUS

## 2023-08-18 MED ORDER — ONDANSETRON HCL 4 MG/2ML IJ SOLN
4.0000 mg | Freq: Once | INTRAMUSCULAR | Status: AC
Start: 2023-08-18 — End: 2023-08-18
  Administered 2023-08-18: 4 mg via INTRAVENOUS
  Filled 2023-08-18: qty 2

## 2023-08-18 MED ORDER — FENTANYL CITRATE PF 50 MCG/ML IJ SOSY
25.0000 ug | PREFILLED_SYRINGE | Freq: Four times a day (QID) | INTRAMUSCULAR | Status: DC | PRN
  Administered 2023-08-19 – 2023-08-20 (×4): 25 ug via INTRAVENOUS
  Filled 2023-08-18 (×4): qty 1

## 2023-08-18 MED ORDER — LACTATED RINGERS IV BOLUS
1500.0000 mL | Freq: Once | INTRAVENOUS | Status: AC
  Administered 2023-08-18: 1500 mL via INTRAVENOUS

## 2023-08-18 MED ORDER — LACTATED RINGERS IV BOLUS (SEPSIS): 250.0000 mL | Freq: Once | INTRAVENOUS | Status: DC

## 2023-08-18 NOTE — Progress Notes (Signed)
 Attempted foley catheter insertion and met resistance, unable to advance catheter. Made Dr. Lucina Sabal aware and MD gave order for Sarah, RN charge to attempt coude catheter insertion and if not successful then to consult urology. Bladder scan volume 502 cc.

## 2023-08-18 NOTE — ED Notes (Signed)
 Pt meets MAP criteria for levophed , per Dr. Tan, continue to increase at this time to improve systolic.

## 2023-08-18 NOTE — Progress Notes (Signed)
 Elink nurse requested 3rd lactate.

## 2023-08-18 NOTE — ED Notes (Signed)
 O2 increased at this time to 6 L [place].

## 2023-08-18 NOTE — Progress Notes (Signed)
 CODE SEPSIS - PHARMACY COMMUNICATION  **Broad Spectrum Antibiotics should be administered within 1 hour of Sepsis diagnosis**  Time Code Sepsis Called/Page Received: 1239  Antibiotics Ordered: cefepime , metronidazole , vanc  Time of 1st antibiotic administration: 1250  Additional action taken by pharmacy: N/A  If necessary, Name of Provider/Nurse Contacted: N/A    Alice Innocent ,PharmD Clinical Pharmacist  08/18/2023  12:47 PM

## 2023-08-18 NOTE — ED Notes (Signed)
 Verbal order to increase Levophed  to 46mcq/min

## 2023-08-18 NOTE — ED Provider Notes (Signed)
 Patrick Bailey Provider Note    Event Date/Time   First MD Initiated Contact with Patient 08/18/23 1229     (approximate)   History   Abdominal Pain, Emesis, Nausea, Shortness of Breath, and Weakness   HPI  Patrick Bailey. is a 63 y.o. male history of malignant findings with carcinomatosis on chemo, history of hypothyroidism, coronary artery disease, COPD, presenting with 1 week of weakness, constipation, nausea vomiting.  States that has been constipated.  Reports shortness of breath but states that this is old, states abdominal pain is also chronic.  Does report a new cough.  Per EMS, they found him to be hypotensive, was unable to get a good pleth on his pulse ox and so started him on nonrebreather.  Gave him 1 L of fluids with improvement of his blood pressures from systolic blood pressures of the 60s to the 90s.    On independent chart review patient was admitted in December for abdominal pain, nausea, vomiting, diarrhea and treated symptomatically.  He has chronic diarrhea in the setting of the chemotherapy.   Physical Exam   Triage Vital Signs: ED Triage Vitals  Encounter Vitals Group     BP 08/18/23 1232 (!) 76/49     Systolic BP Percentile --      Diastolic BP Percentile --      Pulse Rate 08/18/23 1232 (!) 102     Resp 08/18/23 1232 (!) 21     Temp 08/18/23 1232 (!) 97.4 F (36.3 C)     Temp Source 08/18/23 1232 Oral     SpO2 08/18/23 1232 97 %     Weight 08/18/23 1231 160 lb (72.6 kg)     Height 08/18/23 1231 5\' 11"  (1.803 m)     Head Circumference --      Peak Flow --      Pain Score 08/18/23 1231 10     Pain Loc --      Pain Education --      Exclude from Growth Chart --     Most recent vital signs: Vitals:   08/18/23 1625 08/18/23 1627  BP: (!) 77/54   Pulse: (!) 104   Resp: (!) 21   Temp:  (!) 97.4 F (36.3 C)  SpO2: 91%      General: Awake, no distress.  CV:  Good peripheral perfusion.  Tachycardic Resp:  Normal  effort.  Abd:  No distention.  Mild diffuse tenderness on palpation Other:  Dry oropharynx.   ED Results / Procedures / Treatments   Labs (all labs ordered are listed, but only abnormal results are displayed) Labs Reviewed  LACTIC ACID, PLASMA - Abnormal; Notable for the following components:      Result Value   Lactic Acid, Venous 4.3 (*)    All other components within normal limits  LACTIC ACID, PLASMA - Abnormal; Notable for the following components:   Lactic Acid, Venous 6.7 (*)    All other components within normal limits  COMPREHENSIVE METABOLIC PANEL - Abnormal; Notable for the following components:   Sodium 122 (*)    Chloride 81 (*)    CO2 21 (*)    Glucose, Bld 117 (*)    BUN 88 (*)    Creatinine, Ser 2.52 (*)    Calcium  8.7 (*)    Albumin  3.3 (*)    GFR, Estimated 28 (*)    Anion gap 20 (*)    All other components within normal limits  CBC WITH DIFFERENTIAL/PLATELET - Abnormal; Notable for the following components:   WBC 16.2 (*)    Hemoglobin 12.5 (*)    HCT 37.4 (*)    MCV 77.4 (*)    MCH 25.9 (*)    RDW 16.2 (*)    Neutro Abs 15.2 (*)    Lymphs Abs 0.4 (*)    All other components within normal limits  PROTIME-INR - Abnormal; Notable for the following components:   Prothrombin Time 25.9 (*)    INR 2.3 (*)    All other components within normal limits  APTT - Abnormal; Notable for the following components:   aPTT 54 (*)    All other components within normal limits  BLOOD GAS, VENOUS - Abnormal; Notable for the following components:   pO2, Ven <31 (*)    Acid-base deficit 2.4 (*)    All other components within normal limits  BLOOD GAS, ARTERIAL - Abnormal; Notable for the following components:   pO2, Arterial 61 (*)    All other components within normal limits  RESP PANEL BY RT-PCR (RSV, FLU A&B, COVID)  RVPGX2  CULTURE, BLOOD (ROUTINE X 2)  CULTURE, BLOOD (ROUTINE X 2)  BRAIN NATRIURETIC PEPTIDE  URINALYSIS, W/ REFLEX TO CULTURE (INFECTION  SUSPECTED)  LACTIC ACID, PLASMA  LACTIC ACID, PLASMA  TROPONIN I (HIGH SENSITIVITY)  TROPONIN I (HIGH SENSITIVITY)     EKG  Sinus rhythm, normal QRS, normal QTc, no ischemic ST elevation, T wave is flattened in the inferior lateral leads, T wave changes new compared to prior   RADIOLOGY Independent review of chest x-ray showed consolidation in the right base.   PROCEDURES:  Critical Care performed: Yes, see critical care procedure note(s)  .Critical Care  Performed by: Shane Darling, MD Authorized by: Shane Darling, MD   Critical care provider statement:    Critical care time (minutes):  45   Critical care time was exclusive of:  Separately billable procedures and treating other patients   Critical care was necessary to treat or prevent imminent or life-threatening deterioration of the following conditions:  Sepsis and shock   Critical care was time spent personally by me on the following activities:  Development of treatment plan with patient or surrogate, discussions with consultants, evaluation of patient's response to treatment, examination of patient, ordering and review of laboratory studies, ordering and review of radiographic studies, ordering and performing treatments and interventions, pulse oximetry, re-evaluation of patient's condition and review of old charts Central Line  Date/Time: 08/18/2023 4:38 PM  Performed by: Shane Darling, MD Authorized by: Shane Darling, MD   Consent:    Consent obtained:  Written   Consent given by:  Patient   Risks discussed:  Arterial puncture, nerve damage, bleeding, infection and pneumothorax   Alternatives discussed:  Delayed treatment Universal protocol:    Procedure explained and questions answered to patient or proxy's satisfaction: yes     Relevant documents present and verified: yes     Test results available: yes     Imaging studies available: yes     Patient identity confirmed:  Verbally with patient Pre-procedure details:     Indication(s): central venous access     Hand hygiene: Hand hygiene performed prior to insertion     Sterile barrier technique: All elements of maximal sterile technique followed     Skin preparation:  Chlorhexidine    Skin preparation agent: Skin preparation agent completely dried prior to procedure   Sedation:    Sedation  type:  None Anesthesia:    Anesthesia method:  Local infiltration   Local anesthetic:  Lidocaine  1% w/o epi Procedure details:    Location:  L internal jugular   Site selection rationale:  Patient has a port on the right and had a better view of the left IJ compared to the right side.   Patient position:  Supine   Procedural supplies:  Triple lumen   Catheter size:  7 Fr   Landmarks identified: yes     Ultrasound guidance: yes     Ultrasound guidance timing: real time     Sterile ultrasound techniques: Sterile gel and sterile probe covers were used     Number of attempts:  2   Successful placement: yes   Post-procedure details:    Post-procedure:  Dressing applied and line sutured   Assessment:  Blood return through all ports, no pneumothorax on x-ray, free fluid flow and placement verified by x-ray   Procedure completion:  Tolerated Ultrasound ED Echo  Date/Time: 08/18/2023 4:40 PM  Performed by: Shane Darling, MD Authorized by: Shane Darling, MD   Procedure details:    Indications: hypotension     Views: parasternal long axis view and IVC view   Findings:    Pericardium: no pericardial effusion     IVC: collapsed   Impression:    Impression comment:  Patient is very fluid tolerant. Ultrasound ED Thoracic  Date/Time: 08/18/2023 4:40 PM  Performed by: Shane Darling, MD Authorized by: Shane Darling, MD   Procedure details:    Indications: hypotension     Left lung pleural:  Visualized   Right lung pleural:  Visualized   Images: not archived   Findings:    A-lines noted throughout: identified     B-lines noted throughout: not identified   Right  Lung Findings:     right lung sliding Left Lung Findings:     left lung sliding Impression:    Impression: none     Impression comment:  No B-lines, no fluid overloaded    MEDICATIONS ORDERED IN ED: Medications  norepinephrine  (LEVOPHED ) 4mg  in (0.016 mg/mL) premix infusion (10 mcg/min Intravenous Rate/Dose Change 08/18/23 1626)  docusate sodium  (COLACE) capsule 100 mg (has no administration in time range)  polyethylene glycol (MIRALAX  / GLYCOLAX ) packet 17 g (has no administration in time range)  ceFEPIme  (MAXIPIME ) 2 g in sodium chloride  0.9 % 100 mL IVPB (0 g Intravenous Stopped 08/18/23 1456)  metroNIDAZOLE  (FLAGYL ) IVPB 500 mg (0 mg Intravenous Stopped 08/18/23 1456)  vancomycin  (VANCOCIN ) IVPB 1000 mg/200 mL premix (0 mg Intravenous Stopped 08/18/23 1412)  lactated ringers  bolus 1,000 mL (0 mLs Intravenous Stopped 08/18/23 1412)  lactated ringers  bolus 1,500 mL (0 mLs Intravenous Stopped 08/18/23 1601)  ondansetron  (ZOFRAN ) injection 4 mg (4 mg Intravenous Given 08/18/23 1645)     IMPRESSION / MDM / ASSESSMENT AND PLAN / ED COURSE  I reviewed the triage vital signs and the nursing notes.                              Differential diagnosis includes, but is not limited to, septic shock, possibly pneumonia versus intra-abdominal, dehydration, electrolyte derangements, considered PE but patient later stated that the shortness of breath is not new.  Patient's presentation is most consistent with acute presentation with potential threat to life or bodily function.  Patient presenting with septic shock, likely pulmonary source, was already given  antibiotics with IV cefepime , vanc, Flagyl , was given a total of 2.5 L LR here, was given 1 L by EMS so he is completed his 30 cc per'kg, was started on Levophed  and had a central line placed for persistent shock.  Lactate on repeat was uptrending, will repeat.  Sepsis reassessment done at 3 PM, patient hypotensive to systolic of 70s, fluids  are still running, we will plan to place a central line and start him on Levophed  given his profound shock.  Patient's respiratory panel is negative, he is hyponatremic with an AKI, LFTs are normal.  Given patient's high risk, when having admitted to the ICU, consulted ICU who will come down to see the patient.  Clinical Course as of 08/18/23 1647  Wed Aug 18, 2023  1342 Initially placed patient for CT PE study as well as CT abdomen pelvis but is GFR is 28, will take out the CT PE study and place a CT abdomen pelvis without IV contrast. [TT]  1342 Lactic Acid, Venous(!!): 4.3 Elevated likely septic shock. [TT]  1347 Assessed patient with ultrasound, he has no B-lines bilaterally, his IVC is very collapsed. [TT]  1348 Was able to get additional history from him since he was very vague about his history before, he says that he has shortness of breath that is old, a cough that is new, nausea vomiting that is relatively new but his abdominal pain is old. [TT]  1528 DG Chest Portable 1 View Triple-lumen appears to be in the appropriate position, okay to use.  This is based on my interpretation. [TT]    Clinical Course User Index [TT] Shane Darling, MD     FINAL CLINICAL IMPRESSION(S) / ED DIAGNOSES   Final diagnoses:  Septic shock (HCC)  Pneumonia of right lung due to infectious organism, unspecified part of lung  Acute cough  Nausea and vomiting, unspecified vomiting type  History of cancer     Rx / DC Orders   ED Discharge Orders     None        Note:  This document was prepared using Dragon voice recognition software and may include unintentional dictation errors.    Shane Darling, MD 08/18/23 [phone]

## 2023-08-18 NOTE — ED Notes (Signed)
 Ok to use central line per Dr. Tan

## 2023-08-18 NOTE — ED Triage Notes (Signed)
 Pt to ER via EMS from home with c/o 1 week of SHOB, weakness, constipation, abdominal pain and n/v/d.  Pt has hx of prostate CA.

## 2023-08-18 NOTE — Progress Notes (Signed)
 PHARMACY CONSULT NOTE - ELECTROLYTES  Pharmacy Consult for Electrolyte Monitoring and Replacement   Recent Labs: Height: 5\' 11"  (180.3 cm) Weight: 72.6 kg (160 lb) IBW/kg (Calculated) : 75.3 Estimated Creatinine Clearance: 30.8 mL/min (A) (by C-G formula based on SCr of 2.52 mg/dL (H)). Potassium (mmol/L)  Date Value  08/18/2023 3.6  03/11/2014 4.0   Magnesium  (mg/dL)  Date Value  09/81/1914 2.1   Calcium  (mg/dL)  Date Value  78/29/5621 8.7 (L)   Calcium , Total (mg/dL)  Date Value  30/86/5784 8.6   Albumin  (g/dL)  Date Value  69/62/9528 3.3 (L)   Phosphorus (mg/dL)  Date Value  41/32/4401 2.7   Sodium (mmol/L)  Date Value  08/18/2023 122 (L)  03/11/2014 137   Assessment  Patrick Gaber. is a 63 y.o. male presenting with weakness, nausea, and vomiting. PMH significant for hypothyroidism, coronary artery disease, COPD. Pharmacy has been consulted to monitor and replace electrolytes.  Diet: NPO MIVF: N/A Pertinent medications: N/A  Goal of Therapy: Electrolytes WNL  Plan:  No replacement warranted today Check BMP, Mg, Phos with AM labs  Thank you for allowing pharmacy to be a part of this patient's care.  Ananias Balls, PharmD Clinical Pharmacist 08/18/2023 5:00 PM

## 2023-08-18 NOTE — Consult Note (Signed)
 Pharmacy Antibiotic Note  Patrick Bailey. is a 63 y.o. 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  Pharmacy has been consulted for cefepime  and vancomycin  dosing.  Scr 2.52 (BL ~ 0.8). Status post fluid resuscitation and now on norepinephrine . High flow nasal cannula.   Plan:  Cefepime  2 g IV q12h --Calculated CrCl = 30.8 mL/min --Monitor UOP, adjust as indicated based on renal function indices  Vancomycin  1500 mg IV LD (1000 mg + 500 mg) --Dose per levels given AKI, MRSA screen is pending --Scr tomorrow AM, assess for levels vs maintenance regimen  Height: 5\' 11"  (180.3 cm) Weight: 72.6 kg (160 lb) IBW/kg (Calculated) : 75.3  Temp (24hrs), Avg:97.4 F (36.3 C), Min:97.4 F (36.3 C), Max:97.4 F (36.3 C)  Recent Labs  Lab 08/18/23 1235 08/18/23 1520 08/18/23 1716  WBC 16.2*  --   --   CREATININE 2.52*  --   --   LATICACIDVEN 4.3* 6.7* 3.8*    Estimated Creatinine Clearance: 30.8 mL/min (A) (by C-G formula based on SCr of 2.52 mg/dL (H)).    Allergies  Allergen Reactions   Hydrocodone  Hives    Per pt able to tolerate hydromorphone     Antimicrobials this admission: Metronidazole  1/15 x 1 Vancomycin  1/15 >>  Cefepime  1/15 >>  Azithromycin  1/15 >> 1/17 (ordered x 3 doses)  Dose adjustments this admission: N/A  Microbiology results: 1/15 BCx: pending 1/15 Sputum: pending  1/15 MRSA PCR: pending  Thank you for allowing pharmacy to be a part of this patient's care.  Page Boast 08/18/2023 5:55 PM

## 2023-08-18 NOTE — H&P (Signed)
 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  He reports 1 week of weakness, nasal congestion chills and night sweats.  His sister was at bedside and reported that when she called them last week him and his father sounded pretty sick over the phone.  He also reports abdominal pain.  On EMS arrival at home he was found to be hypotensive cold and unable to obtain SpO2 so he was placed on nonrebreather.  On arrival to the ED he was found to be hypoxic requiring 12 L via high flow nasal cannula.  Labs with leukocytosis 16.2.  H&H stable platelets stable.  Creatinine 2.52 from a baseline of 0.9 mg/dL.  Bicarb 21.  Sodium 122.  Chest x-ray 01/15 with right lower lobe opacity.  CT abdomen and pelvis was obtained which showed multifocal opacities in the lower lung zones.  Pending official read.   Pertinent  Medical History  As above  Objective   Blood pressure (!) 87/66, pulse (!) 105, temperature 98.6 F (37 C), temperature source Oral, resp. rate (!) 21, height 5\' 11"  (1.803 m), weight 72.6 kg, SpO2 96%.        Intake/Output Summary (Last 24 hours) at 08/18/2023 1924 Last data filed at 08/18/2023 1900 Gross per 24 hour  Intake 2438.45 ml  Output --  Net 2438.45 ml   Filed Weights   08/18/23 1231  Weight: 72.6 kg    Examination: Physical exam Chronically ill, cachectic Tachycardic, normal S1, normal S2 regular rhythm Lungs crackles at the bases  Abdomen distended, tender diffusely  Extremities warm, no edema   Labs and imaging were reviewing.    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 Found to be in septic shock secondary to CAP and possible intraabdominal source.   #Septic shock secondary to  #CAP with CT chest multifocal opacities, leukocytosis and shortness of breaht.  # CT abdomen pelvis with distended stomach and fluid-filled esophagus likely gastroparesis in the setting of being chronically on opioids. # History of appendiceal carcinomatosis status post right hemicolectomy 11/2021 with last chemotherapy in November 2024 currently with relapse and per last note 12/12 from [facility] oncology referral for hospice was place. #AKI with Cr 2.5mg /dl from a baseline  #Lactic acidosis secondary to the above.   Neuro: Fentanyl  25 Q6H PRN. Hold Gabapentin  for tonight will need to restart it in the am.  [facility]: [place] for MAPs > 65  Lungs/ID: Vanc cefepime  flagyl  and Azithromycin . Pending micro data. MRSA swab. Sputum cx. Urine strep and legionella.  GI: NPO. Lax scheduled. NG tube placement.  Renal: Coude cath placed. UA pending. Trend cr daily.  Endo: POC 140-180. ISS  Palliative care consult in the am.   I spent 60 minutes caring for this patient today, including preparing to see the patient, obtaining a medical history , reviewing a separately obtained history, performing a medically appropriate examination and/or evaluation, counseling and educating  the patient/family/caregiver, ordering medications, tests, or procedures, documenting clinical information in the electronic health record, and independently interpreting results (not separately reported/billed) and communicating results to the patient/family/caregiver  Annitta Kindler, MD [facility] 08/18/2023  7:24 PM

## 2023-08-18 NOTE — Progress Notes (Signed)
 Elink is following code sepsis.

## 2023-08-19 ENCOUNTER — Inpatient Hospital Stay: Payer: BC Managed Care – PPO

## 2023-08-19 DIAGNOSIS — Z515 Encounter for palliative care: Secondary | ICD-10-CM | POA: Diagnosis not present

## 2023-08-19 DIAGNOSIS — R6521 Severe sepsis with septic shock: Principal | ICD-10-CM

## 2023-08-19 DIAGNOSIS — A419 Sepsis, unspecified organism: Secondary | ICD-10-CM | POA: Diagnosis not present

## 2023-08-19 DIAGNOSIS — R579 Shock, unspecified: Secondary | ICD-10-CM | POA: Diagnosis not present

## 2023-08-19 LAB — BASIC METABOLIC PANEL
Anion gap: 13 (ref 5–15)
BUN: 80 mg/dL — ABNORMAL HIGH (ref 8–23)
CO2: 25 mmol/L (ref 22–32)
Calcium: 8 mg/dL — ABNORMAL LOW (ref 8.9–10.3)
Chloride: 87 mmol/L — ABNORMAL LOW (ref 98–111)
Creatinine, Ser: 2.05 mg/dL — ABNORMAL HIGH (ref 0.61–1.24)
GFR, Estimated: 36 mL/min — ABNORMAL LOW (ref >60)
Glucose, Bld: 104 mg/dL — ABNORMAL HIGH (ref 70–99)
Potassium: 3.4 mmol/L — ABNORMAL LOW (ref 3.5–5.1)
Sodium: 125 mmol/L — ABNORMAL LOW (ref 135–145)

## 2023-08-19 LAB — CBC
HCT: 30.1 % — ABNORMAL LOW (ref 39.0–52.0)
Hemoglobin: 9.9 g/dL — ABNORMAL LOW (ref 13.0–17.0)
MCH: 25.9 pg — ABNORMAL LOW (ref 26.0–34.0)
MCHC: 32.9 g/dL (ref 30.0–36.0)
MCV: 78.8 fL — ABNORMAL LOW (ref 80.0–100.0)
Platelets: 213 10*3/uL (ref 150–400)
RBC: 3.82 MIL/uL — ABNORMAL LOW (ref 4.22–5.81)
RDW: 16.4 % — ABNORMAL HIGH (ref 11.5–15.5)
WBC: 14.1 10*3/uL — ABNORMAL HIGH (ref 4.0–10.5)
nRBC: 0 % (ref 0.0–0.2)

## 2023-08-19 LAB — MAGNESIUM: Magnesium: 1.3 mg/dL — ABNORMAL LOW (ref 1.7–2.4)

## 2023-08-19 LAB — BLOOD GAS, VENOUS
Bicarbonate: 25.6 mmol/L — ABNORMAL HIGH (ref 20.0–28.0)
O2 Saturation: 15.9 %
Patient temperature: 37 %
pCO2, Ven: 57 mmHg (ref 44–60)
pH, Ven: 7.26 (ref 7.25–7.43)
pO2, Ven: <31 mmol/L (ref 32–45)

## 2023-08-19 LAB — GLUCOSE, CAPILLARY
Glucose-Capillary: 103 mg/dL — ABNORMAL HIGH (ref 70–99)
Glucose-Capillary: 110 mg/dL — ABNORMAL HIGH (ref 70–99)
Glucose-Capillary: 116 mg/dL — ABNORMAL HIGH (ref 70–99)
Glucose-Capillary: 118 mg/dL — ABNORMAL HIGH (ref 70–99)
Glucose-Capillary: 126 mg/dL — ABNORMAL HIGH (ref 70–99)
Glucose-Capillary: 84 mg/dL (ref 70–99)
Glucose-Capillary: 89 mg/dL (ref 70–99)
Glucose-Capillary: 89 mg/dL (ref 70–99)

## 2023-08-19 LAB — PHOSPHORUS: Phosphorus: 3.4 mg/dL (ref 2.5–4.6)

## 2023-08-19 MED ORDER — POLYETHYLENE GLYCOL 3350 17 G PO PACK
17.0000 g | PACK | Freq: Every day | ORAL | Status: DC
  Administered 2023-08-19 – 2023-08-27 (×5): 17 g via ORAL
  Filled 2023-08-19 (×8): qty 1

## 2023-08-19 MED ORDER — SUCRALFATE 1 GM/10ML PO SUSP
1.0000 g | Freq: Once | ORAL | Status: AC
Start: 2023-08-19 — End: 2023-08-19
  Administered 2023-08-19: 1 g via ORAL
  Filled 2023-08-19 (×2): qty 10

## 2023-08-19 MED ORDER — FLEET ENEMA RE ENEM
1.0000 | ENEMA | Freq: Once | RECTAL | Status: AC
  Administered 2023-08-19: 1 via RECTAL

## 2023-08-19 MED ORDER — GABAPENTIN 300 MG PO CAPS
300.0000 mg | ORAL_CAPSULE | Freq: Three times a day (TID) | ORAL | Status: DC
Start: 2023-08-19 — End: 2023-08-27
  Administered 2023-08-19 – 2023-08-27 (×24): 300 mg via ORAL
  Filled 2023-08-19 (×24): qty 1

## 2023-08-19 MED ORDER — POTASSIUM CHLORIDE 10 MEQ/100ML IV SOLN
10.0000 meq | INTRAVENOUS | Status: AC
  Administered 2023-08-19 (×2): 10 meq via INTRAVENOUS
  Filled 2023-08-19 (×2): qty 100

## 2023-08-19 MED ORDER — METOPROLOL TARTRATE 25 MG PO TABS
25.0000 mg | ORAL_TABLET | Freq: Two times a day (BID) | ORAL | Status: DC
  Administered 2023-08-19 (×2): 25 mg via ORAL
  Filled 2023-08-19 (×3): qty 1

## 2023-08-19 MED ORDER — ONDANSETRON HCL 4 MG/2ML IJ SOLN
4.0000 mg | Freq: Four times a day (QID) | INTRAMUSCULAR | Status: DC
  Administered 2023-08-19 – 2023-08-27 (×28): 4 mg via INTRAVENOUS
  Filled 2023-08-19 (×29): qty 2

## 2023-08-19 MED ORDER — FENTANYL CITRATE PF 50 MCG/ML IJ SOSY
12.5000 ug | PREFILLED_SYRINGE | Freq: Once | INTRAMUSCULAR | Status: AC
  Administered 2023-08-19: 12.5 ug via INTRAVENOUS
  Filled 2023-08-19: qty 1

## 2023-08-19 MED ORDER — HYDROMORPHONE HCL 2 MG PO TABS
2.0000 mg | ORAL_TABLET | ORAL | Status: DC | PRN
  Administered 2023-08-20 – 2023-08-26 (×5): 2 mg via ORAL
  Filled 2023-08-19 (×5): qty 1

## 2023-08-19 MED ORDER — OXYCODONE HCL 5 MG PO TABS
10.0000 mg | ORAL_TABLET | Freq: Four times a day (QID) | ORAL | Status: DC
  Administered 2023-08-19 – 2023-08-20 (×6): 10 mg via ORAL
  Filled 2023-08-19 (×6): qty 2

## 2023-08-19 MED ORDER — MAGNESIUM SULFATE 2 GM/50ML IV SOLN
2.0000 g | Freq: Once | INTRAVENOUS | Status: AC
  Administered 2023-08-19: 2 g via INTRAVENOUS
  Filled 2023-08-19: qty 50

## 2023-08-19 MED ORDER — BOOST / RESOURCE BREEZE PO LIQD CUSTOM
1.0000 | Freq: Three times a day (TID) | ORAL | Status: DC
  Administered 2023-08-19 – 2023-08-22 (×3): 1 via ORAL

## 2023-08-19 MED ORDER — ADULT MULTIVITAMIN W/MINERALS CH
1.0000 | ORAL_TABLET | Freq: Every day | ORAL | Status: DC
Start: 2023-08-20 — End: 2023-08-27
  Administered 2023-08-20 – 2023-08-27 (×8): 1 via ORAL
  Filled 2023-08-19 (×8): qty 1

## 2023-08-19 NOTE — Progress Notes (Signed)
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  He reports 1 week of weakness, nasal congestion chills and night sweats.  His sister was at bedside and reported that when she called them last week him and his father sounded pretty sick over the phone.  He also reports abdominal pain.  On EMS arrival at home he was found to be hypotensive cold and unable to obtain SpO2 so he was placed on nonrebreather.  On arrival to the ED he was found to be hypoxic requiring 12 L via high flow nasal cannula.  Labs with leukocytosis 16.2.  H&H stable platelets stable.  Creatinine 2.52 from a baseline of 0.9 mg/dL.  Bicarb 21.  Sodium 122.  Chest x-ray 01/15 with right lower lobe opacity.  CT abdomen and pelvis was obtained which showed multifocal opacities in the lower lung zones.  Pending official read.   Pertinent  Medical History  As above   Subjective  This AM patient appears better. More energetic and appetite has improved.   He is off pressors.   WBC downtrending - Lactate resolved. Kidney function improving.   Objective   Blood pressure (!) 85/69, pulse (!) 106, temperature 98.1 F (36.7 C), temperature source Oral, resp. rate (!) 26, height 5\' 11"  (1.803 m), weight 70.2 kg, SpO2 99%.        Intake/Output Summary (Last 24 hours) at 08/19/2023 0940 Last data filed at 08/19/2023 0900 Gross per 24 hour  Intake 3269.41 ml  Output 2250 ml  Net 1019.41 ml   Filed Weights   08/18/23 1231 08/19/23 0500  Weight: 72.6 kg 70.2 kg    Examination: Physical exam Chronically ill,  cachectic Tachycardic, normal S1, normal S2 regular rhythm Lungs crackles at the bases  Abdomen distended, tender diffusely but improved from yesterday Extremities warm, no edema   Labs and imaging were reviewing.    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 Found to be in septic shock secondary to CAP and possible intraabdominal source.   #Septic shock secondary to  #CAP with CT chest multifocal opacities, leukocytosis and shortness of breaht.  # CT abdomen pelvis with distended stomach and fluid-filled esophagus likely gastroparesis in the setting of being chronically on opioids. # History of appendiceal carcinomatosis status post right hemicolectomy 11/2021 with last chemotherapy in November 2024 currently with relapse and per last note 12/12 from [facility] oncology referral for hospice was place. #AKI with Cr 2.5mg /dl from a baseline Improving #Lactic acidosis secondary to the above. Resolved   Neuro: Fentanyl 25 Q6H PRN. Restart home pain regimen and Gabapentin.  [facility]: NE for MAPs > 65. Restart home metoprolol at half dose.  Lungs/ID: d/c Vanc c/w cefepime flagyl and Azithromycin. Pending micro data. MRSA swab - . Sputum cx pending. Urine strep - and legionella pending.  GI: Can trial clear liquid diet. Lax scheduled. NG tube placement.  Renal: Coude cath placed. UA -.  Trend cr daily.  Endo: POC 140-180. ISS  Palliative care consult placed.   I spent 50 minutes caring for this patient today, including preparing to see the patient, obtaining a medical history , reviewing a separately obtained history, performing a medically appropriate examination and/or evaluation, counseling and educating the  patient/family/caregiver, ordering medications, tests, or procedures, documenting clinical information in the electronic health record, and independently interpreting results (not separately reported/billed) and communicating results to the patient/family/caregiver  Janann Colonel, MD [facility] 08/18/2023 7:24 PM

## 2023-08-19 NOTE — Plan of Care (Signed)

## 2023-08-19 NOTE — Progress Notes (Signed)
PHARMACY CONSULT NOTE - ELECTROLYTES  Pharmacy Consult for Electrolyte Monitoring and Replacement   Recent Labs: Height: 5\' 11"  (180.3 cm) Weight: 70.2 kg (154 lb 12.2 oz) IBW/kg (Calculated) : 75.3 Estimated Creatinine Clearance: 36.6 mL/min (A) (by C-G formula based on SCr of 2.05 mg/dL (H)). Potassium (mmol/L)  Date Value  08/19/2023 3.4 (L)  03/11/2014 4.0   Magnesium (mg/dL)  Date Value  60/45/4098 1.3 (L)   Calcium (mg/dL)  Date Value  11/91/4782 8.0 (L)   Calcium, Total (mg/dL)  Date Value  95/62/1308 8.6   Albumin (g/dL)  Date Value  65/78/4696 3.3 (L)   Phosphorus (mg/dL)  Date Value  29/52/8413 3.4   Sodium (mmol/L)  Date Value  08/19/2023 125 (L)  03/11/2014 137   Assessment  Patrick Karm. is a 63 y.o. male presenting with weakness, nausea, and vomiting. PMH significant for hypothyroidism, coronary artery disease, COPD. Pharmacy has been consulted to monitor and replace electrolytes.  Diet: NPO MIVF: N/A Pertinent medications: N/A  Goal of Therapy: Electrolytes WNL  Plan:  K 3.4: Kcl IV x 2 Mag 1.3: Mag sulfate 2g IV x 1 Check BMP, Mg, Phos with AM labs  Thank you for allowing pharmacy to be a part of this patient's care.  Bettey Costa, PharmD Clinical Pharmacist 08/19/2023 7:42 AM

## 2023-08-19 NOTE — Progress Notes (Signed)
Initial Nutrition Assessment  DOCUMENTATION CODES:   Severe malnutrition in context of chronic illness  INTERVENTION:   Boost Breeze po TID, each supplement provides 250 kcal and 9 grams of protein  Magic cup TID with meals, each supplement provides 290 kcal and 9 grams of protein  MVI po daily   Pt at high refeed risk; recommend monitor potassium, magnesium and phosphorus labs daily until stable  Daily weights   NUTRITION DIAGNOSIS:   Severe Malnutrition related to cancer and cancer related treatments as evidenced by severe fat depletion, severe muscle depletion, 23 percent weight loss in 5 months.  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 Pt is followed by home hospice.  Met with pt in room today. Pt reports fair appetite and oral intake at baseline but reports poor oral intake for 1 week pta r/t abdominal pain and nausea. Pt reports that he is feeling better today; pt asking for [place] ice. Distension improved per pt report. Pt is having bowel function. Pt has been drinking water and gingerale this morning. Pt initiated on clear liquids today. NGT in place with output; pt undergoing clamp trial  currently. Pt reports that he is unable to tolerate any "milky supplements" secondary to vomiting but he will try Boost Breeze. RD will add supplements to help pt meet his estimated needs. Pt is likely at refeed risk.   Per chart, pt is down 45lbs(23%) over the past 5 months; this is severe weight loss.   Medications reviewed and include: colace, heparin, oxycodone, protonix, miralax, azithromycin, metronidazole   Labs reviewed: Na 125(L), K 3.4(L), BUN 80(H), creat 2.05(H), P 3.4 wnl, Mg 1.3(L) Wbc- 14.1(H), Hgb 9.9(L), Hct 30.1(L), MCV 78.8(L), MCH 25.9(L) Cbgs- 89, 103, 89, 116 x 24 hrs   NUTRITION - FOCUSED PHYSICAL EXAM:  Flowsheet Row Most Recent Value  Orbital Region Moderate depletion  Upper Arm Region Severe depletion  Thoracic and Lumbar Region Severe depletion  Buccal Region Moderate depletion  Temple Region Severe depletion  Clavicle Bone Region Severe depletion  Clavicle and Acromion Bone Region Severe depletion  Scapular Bone Region Severe depletion  Dorsal Hand Severe depletion  Patellar Region Severe depletion  Anterior Thigh Region Severe depletion  Posterior Calf Region Severe depletion  Edema (RD Assessment) None  Hair Reviewed  Eyes Reviewed  Mouth Reviewed  Skin Reviewed  Nails Reviewed   Diet Order:   Diet Order             Diet clear liquid Room service appropriate? Yes; Fluid consistency: Thin  Diet effective now                  EDUCATION NEEDS:   Education needs have been addressed  Skin:  Skin Assessment: Reviewed RN Assessment  Last BM:  1/16- type 7  Height:  Ht Readings from Last 1 Encounters:  08/18/23 5\' 11"  (1.803 m)    Weight:   Wt Readings from Last 1 Encounters:  08/19/23 70.2 kg    Ideal Body Weight:  78 kg  BMI:  Body mass index is 21.59 kg/m.  Estimated Nutritional Needs:   Kcal:  2100-2400kcal/day  Protein:  105-120g/day  Fluid:  1.8-2.1L/day  Betsey Holiday MS, RD, LDN If unable to be reached,  please send secure chat to "RD inpatient" available from 8:00a-4:00p daily
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
## 2023-08-20 DIAGNOSIS — J9601 Acute respiratory failure with hypoxia: Secondary | ICD-10-CM | POA: Diagnosis not present

## 2023-08-20 DIAGNOSIS — C181 Malignant neoplasm of appendix: Secondary | ICD-10-CM

## 2023-08-20 DIAGNOSIS — J189 Pneumonia, unspecified organism: Secondary | ICD-10-CM | POA: Diagnosis not present

## 2023-08-20 DIAGNOSIS — D638 Anemia in other chronic diseases classified elsewhere: Secondary | ICD-10-CM | POA: Insufficient documentation

## 2023-08-20 DIAGNOSIS — N179 Acute kidney failure, unspecified: Secondary | ICD-10-CM

## 2023-08-20 DIAGNOSIS — E878 Other disorders of electrolyte and fluid balance, not elsewhere classified: Secondary | ICD-10-CM

## 2023-08-20 DIAGNOSIS — E43 Unspecified severe protein-calorie malnutrition: Secondary | ICD-10-CM

## 2023-08-20 DIAGNOSIS — A419 Sepsis, unspecified organism: Secondary | ICD-10-CM | POA: Diagnosis not present

## 2023-08-20 DIAGNOSIS — R338 Other retention of urine: Secondary | ICD-10-CM

## 2023-08-20 LAB — RENAL FUNCTION PANEL
Albumin: 2.5 g/dL — ABNORMAL LOW (ref 3.5–5.0)
Anion gap: 12 (ref 5–15)
BUN: 52 mg/dL — ABNORMAL HIGH (ref 8–23)
CO2: 25 mmol/L (ref 22–32)
Calcium: 8.2 mg/dL — ABNORMAL LOW (ref 8.9–10.3)
Chloride: 91 mmol/L — ABNORMAL LOW (ref 98–111)
Creatinine, Ser: 1.19 mg/dL (ref 0.61–1.24)
GFR, Estimated: >60 mL/min (ref >60)
Glucose, Bld: 114 mg/dL — ABNORMAL HIGH (ref 70–99)
Phosphorus: 2.1 mg/dL — ABNORMAL LOW (ref 2.5–4.6)
Potassium: 3.1 mmol/L — ABNORMAL LOW (ref 3.5–5.1)
Sodium: 128 mmol/L — ABNORMAL LOW (ref 135–145)

## 2023-08-20 LAB — GLUCOSE, CAPILLARY
Glucose-Capillary: 96 mg/dL (ref 70–99)
Glucose-Capillary: 98 mg/dL (ref 70–99)
Glucose-Capillary: 85 mg/dL (ref 70–99)
Glucose-Capillary: 97 mg/dL (ref 70–99)
Glucose-Capillary: 92 mg/dL (ref 70–99)

## 2023-08-20 LAB — CBC
HCT: 29.6 % — ABNORMAL LOW (ref 39.0–52.0)
Hemoglobin: 9.6 g/dL — ABNORMAL LOW (ref 13.0–17.0)
MCH: 25.9 pg — ABNORMAL LOW (ref 26.0–34.0)
MCHC: 32.4 g/dL (ref 30.0–36.0)
MCV: 79.8 fL — ABNORMAL LOW (ref 80.0–100.0)
Platelets: 189 10*3/uL (ref 150–400)
RBC: 3.71 MIL/uL — ABNORMAL LOW (ref 4.22–5.81)
RDW: 16.6 % — ABNORMAL HIGH (ref 11.5–15.5)
WBC: 10.8 10*3/uL — ABNORMAL HIGH (ref 4.0–10.5)
nRBC: 0 % (ref 0.0–0.2)

## 2023-08-20 LAB — MAGNESIUM: Magnesium: 2.1 mg/dL (ref 1.7–2.4)

## 2023-08-20 MED ORDER — OXYCODONE HCL 5 MG PO TABS
10.0000 mg | ORAL_TABLET | Freq: Four times a day (QID) | ORAL | Status: DC
  Administered 2023-08-21 – 2023-08-25 (×21): 10 mg via ORAL
  Filled 2023-08-20 (×20): qty 2

## 2023-08-20 MED ORDER — TAMSULOSIN HCL 0.4 MG PO CAPS
0.4000 mg | ORAL_CAPSULE | Freq: Every day | ORAL | Status: DC
  Administered 2023-08-20 – 2023-08-21 (×2): 0.4 mg via ORAL
  Filled 2023-08-20 (×2): qty 1

## 2023-08-20 MED ORDER — PANTOPRAZOLE SODIUM 40 MG PO TBEC
40.0000 mg | DELAYED_RELEASE_TABLET | Freq: Two times a day (BID) | ORAL | Status: DC
  Administered 2023-08-20 – 2023-08-27 (×14): 40 mg via ORAL
  Filled 2023-08-20 (×14): qty 1

## 2023-08-20 MED ORDER — POTASSIUM PHOSPHATES 15 MMOLE/5ML IV SOLN
30.0000 mmol | Freq: Once | INTRAVENOUS | Status: AC
  Administered 2023-08-20: 30 mmol via INTRAVENOUS
  Filled 2023-08-20: qty 10

## 2023-08-20 MED ORDER — CALCIUM CARBONATE ANTACID 500 MG PO CHEW
1.0000 | CHEWABLE_TABLET | Freq: Three times a day (TID) | ORAL | Status: DC
Start: 2023-08-20 — End: 2023-08-27
  Administered 2023-08-20 – 2023-08-27 (×19): 200 mg via ORAL
  Filled 2023-08-20 (×20): qty 1

## 2023-08-20 MED ORDER — SODIUM CHLORIDE 0.9 % IV SOLN
2.0000 g | Freq: Three times a day (TID) | INTRAVENOUS | Status: DC
  Administered 2023-08-20 – 2023-08-23 (×10): 2 g via INTRAVENOUS
  Filled 2023-08-20 (×12): qty 12.5

## 2023-08-20 MED ORDER — FAMOTIDINE 20 MG PO TABS
20.0000 mg | ORAL_TABLET | Freq: Once | ORAL | Status: AC
  Administered 2023-08-20: 20 mg via ORAL
  Filled 2023-08-20: qty 1

## 2023-08-20 NOTE — Assessment & Plan Note (Signed)
Present on admission with acute hypoxic respiratory failure, lactic acidosis, acute kidney injury, bilateral pneumonia, leukocytosis, hypotension and tachycardia.  Currently on Zithromax Maxipime and Flagyl.

## 2023-08-20 NOTE — Progress Notes (Signed)
Patient arrived to unit in stable condition with family at bedside. Alert, oriented and pleasant.

## 2023-08-20 NOTE — Consult Note (Signed)
Pharmacy Antibiotic Note  Patrick Bailey. is a 63 y.o. 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  Pharmacy has been consulted for cefepime dosing.  Scr improving(2.52-->1.19) (BL ~ 0.8). Status post fluid resuscitation and now on norepinephrine. High flow nasal cannula.   Plan: Cefepime 2 g IV q8h --Calculated CrCl = 61.2 mL/min --Monitor UOP, adjust as indicated based on renal function indices   Height: 5\' 11"  (180.3 cm) Weight: 68.1 kg (150 lb 2.1 oz) IBW/kg (Calculated) : 75.3  Temp (24hrs), Avg:98.1 F (36.7 C), Min:98.1 F (36.7 C), Max:98.2 F (36.8 C)  Recent Labs  Lab 08/18/23 1235 08/18/23 1520 08/18/23 1716 08/18/23 2023 08/19/23 0553 08/20/23 0517  WBC 16.2*  --   --   --  14.1* 10.8*  CREATININE 2.52*  --   --   --  2.05* 1.19  LATICACIDVEN 4.3* 6.7* 3.8* 1.6  --   --     Estimated Creatinine Clearance: 61.2 mL/min (by C-G formula based on SCr of 1.19 mg/dL).    Allergies  Allergen Reactions   Hydrocodone Hives    Per pt able to tolerate hydromorphone    Antimicrobials this admission: Metronidazole 1/15 >> Vancomycin 1/15 >> 1/16 Cefepime 1/15 >>  Azithromycin 1/15 >> 1/17 (ordered x 3 doses)  Dose adjustments this admission: N/A  Microbiology results: 1/15 BCx: NGTD 1/15 Sputum: pending  1/15 MRSA PCR: negative  Thank you for allowing pharmacy to be a part of this patient's care.  Bettey Costa 08/20/2023 12:17 PM

## 2023-08-20 NOTE — Progress Notes (Signed)
Pt transported by  bed to room 114 by NT. No telemetry.

## 2023-08-20 NOTE — Progress Notes (Signed)
Progress Note   Patient: Patrick Bailey. ONG:[id number] DOB: 1960-12-06 DOA: 08/18/2023     2 DOS: the patient was seen and examined on 08/20/2023   Brief hospital course: 63 year old man past medical history of hypertension, hyperlipidemia, right MCA stroke back in 2023, hypothyroidism, neuropathy, 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  Presented to the ER not feeling well with nasal congestion and night sweats.  Patient was found to be hypotensive and called and unable to obtain a pulse ox.  He was started on high flow nasal cannula and admitted with septic shock requiring pressors.  Patient started on antibiotics secondary to bilateral pneumonia.  1/17.  Transferred to medicine service.  Patient once the NG tube out.  Patient off pressors but blood pressure still on the lower side.  Will hold metoprolol.  Continue antibiotics for pneumonia.  Reviewed previous CT scan with general surgery and they believe that this is likely fluid collection from prior cancer rather than an abscess since it was seen on prior CT scan also.  Will transfer out of ICU.  Assessment and Plan: Septic shock (HCC) Present on admission with acute hypoxic respiratory failure, lactic acidosis, acute kidney injury, bilateral pneumonia, leukocytosis, hypotension and tachycardia.  Currently on Zithromax Maxipime and Flagyl.  Multifocal pneumonia On Maxipime and Zithromax.  Acute respiratory failure with hypoxia (HCC) Patient required high flow nasal cannula now down to 3 L.  Continue to try to taper to off.  Appendix carcinoma [facility]) Follow-up with your oncologist as outpatient.  Electrolyte abnormality Hypokalemia, hypophosphatemia and hyponatremia.  Patient on electrolyte protocol replacement.  AKI (acute kidney injury) (HCC) Today's creatinine down to 1.19.  Creatinine on presentation 2.52.  Acute urinary retention Start Flomax and continue coud catheter.  Protein-calorie  malnutrition, severe Continue supplements  Anemia of chronic disease Last hemoglobin 9.6        Subjective: Patient wants his NG tube out.  Patient off pressors at this point can transfer out to the floor.  Hold metoprolol.  Admitted with septic shock.  Physical Exam: Vitals:   08/20/23 0700 08/20/23 0800 08/20/23 0900 08/20/23 1000  BP: 91/68 97/71 111/83 94/73  Pulse: 85 93 92 86  Resp: 10 17 14 12   Temp:      TempSrc:      SpO2: 97% 98% 97% 96%  Weight:      Height:       Physical Exam HENT:     Head: Normocephalic.     Mouth/Throat:     Pharynx: No oropharyngeal exudate.  Eyes:     General: Lids are normal.     Conjunctiva/sclera: Conjunctivae normal.  Cardiovascular:     Rate and Rhythm: Regular rhythm. Tachycardia present.     Heart sounds: Normal heart sounds, S1 normal and S2 normal.  Pulmonary:     Breath sounds: Examination of the right-lower field reveals decreased breath sounds and rhonchi. Examination of the left-lower field reveals decreased breath sounds and rhonchi. Decreased breath sounds and rhonchi present. No wheezing or rales.  Abdominal:     Palpations: Abdomen is soft.     Tenderness: There is abdominal tenderness in the right lower quadrant.  Musculoskeletal:     Right lower leg: No swelling.     Left lower leg: No swelling.  Skin:    General: Skin is warm.     Findings: No rash.  Neurological:     Mental Status: He is alert and oriented to person, place, and  time.     Data Reviewed: White blood cell count 10.8, hemoglobin 9.6, platelet count 189, sodium 128, potassium 3.1, creatinine 1.19, BUN 52, phosphorus 2.1  Family Communication: Left message for father  Disposition: Status is: Inpatient Remains inpatient appropriate because: Transfer out of the ICU.  Continue IV antibiotics for septic shock.  Planned Discharge Destination: Home    Time spent: 28 minutes  Author: Alford Highland, MD 08/20/2023 12:11 PM  For on call  review [email or web address].

## 2023-08-20 NOTE — Evaluation (Signed)
Physical Therapy Evaluation Patient Details Name: Patrick Bailey. MRN: [id number] DOB: 24-Jun-1961 Today's Date: 08/20/2023  History of Present Illness  Pt is a 63 y.o. male presenting to hospital 08/18/23 with c/o abdominal pain, emesis, nausea, SOB, and weakness.  Pt admitted with septic shock secondary to CAP and possible intraabdominal source.  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  Clinical Impression  Prior to recent medical concerns, pt was ambulatory; currently living with his dad (3 STE L railing into home; flight of steps to 2nd floor bedroom).  6/10 abdominal pain reported during session (nurse updated--pt not due for pain medication yet).  Currently pt is SBA with bed mobility; CGA with transfers; and CGA to ambulate 100 feet with RW use.  SpO2 sats 86% on room air post ambulation; SpO2 improved to 92% on room air within a couple minutes of sitting rest and vc's for pursed lip breathing.  Mild dizziness reported during ambulation.  Generalized weakness and decreased activity tolerance noted from baseline.  Pt would currently benefit from skilled PT to address noted impairments and functional limitations (see below for any additional details).  Upon hospital discharge, pt would benefit from ongoing therapy.     If plan is discharge home, recommend the following: A little help with walking and/or transfers;A little help with bathing/dressing/bathroom;Assistance with cooking/housework;Assist for transportation;Help with stairs or ramp for entrance   Can travel by private vehicle    Yes    Equipment Recommendations Rolling walker (2 wheels);BSC/3in1  Recommendations for Other Services  OT consult    Functional Status Assessment Patient has had a recent decline in their functional status and demonstrates the ability to make significant improvements in function in a reasonable and  predictable amount of time.     Precautions / Restrictions Precautions Precautions: Fall Precaution Comments: R chest port Restrictions Weight Bearing Restrictions Per Provider Order: No      Mobility  Bed Mobility Overal bed mobility: Needs Assistance Bed Mobility: Supine to Sit, Sit to Supine     Supine to sit: Supervision, HOB elevated Sit to supine: Supervision, HOB elevated   General bed mobility comments: SBA for lines    Transfers Overall transfer level: Needs assistance Equipment used: Rolling walker (2 wheels) Transfers: Sit to/from Stand Sit to Stand: Contact guard assist           General transfer comment: x2 trials standing from bed; mild increased effort to stand but steady    Ambulation/Gait Ambulation/Gait assistance: Contact guard assist Gait Distance (Feet): 100 Feet Assistive device: Rolling walker (2 wheels) Gait Pattern/deviations: Step-through pattern, Decreased step length - right, Decreased step length - left Gait velocity: decreased     General Gait Details: steady ambulating with RW use; mild dizziness reported  Stairs            Wheelchair Mobility     Tilt Bed    Modified Rankin (Stroke Patients Only)       Balance Overall balance assessment: Needs assistance Sitting-balance support: No upper extremity supported, Feet supported Sitting balance-Leahy Scale: Good Sitting balance - Comments: steady reaching within BOS   Standing balance support: Single extremity supported Standing balance-Leahy Scale: Fair Standing balance comment: steady static standing with at least single UE support                             Pertinent Vitals/Pain  Pain Assessment Pain Assessment: 0-10 Pain Score: 6  Pain Location: abdominal pain Pain Descriptors / Indicators: Aching Pain Intervention(s): Limited activity within patient's tolerance, Monitored during session, Repositioned, Patient requesting pain meds-RN notified HR  stable during sessions activities.    Home Living Family/patient expects to be discharged to:: Private residence Living Arrangements: Parent (Pt reports currently staying with his dad) Available Help at Discharge: Family;Available PRN/intermittently Type of Home: House Home Access: Stairs to enter Entrance Stairs-Rails: Left Entrance Stairs-Number of Steps: 3 (from back entrance)   Home Layout: Two level;Full bath on main level;Bed/bath upstairs Home Equipment: Grab bars - tub/shower;Rolling Walker (2 wheels) Additional Comments: Above home set-up is his dad's home.    Prior Function Prior Level of Function : Independent/Modified Independent             Mobility Comments: Independent with ambulation; recent falls reported.       Extremity/Trunk Assessment   Upper Extremity Assessment Upper Extremity Assessment: Overall WFL for tasks assessed    Lower Extremity Assessment Lower Extremity Assessment: Generalized weakness    Cervical / Trunk Assessment Cervical / Trunk Assessment: Normal  Communication   Communication Communication: No apparent difficulties Cueing Techniques: Verbal cues  Cognition Arousal: Alert Behavior During Therapy: WFL for tasks assessed/performed Overall Cognitive Status: Within Functional Limits for tasks assessed                                          General Comments General comments (skin integrity, edema, etc.): Mild BM incontinence noted on bed pad; nursing not available to assist so therapist assisted pt with clean-up and placed new bed pad.  Nursing cleared pt for participation in physical therapy.  Pt agreeable to PT session.    Exercises     Assessment/Plan    PT Assessment Patient needs continued PT services  PT Problem List Decreased strength;Decreased activity tolerance;Decreased balance;Decreased mobility;Decreased knowledge of use of DME;Decreased knowledge of precautions;Pain       PT Treatment  Interventions DME instruction;Gait training;Stair training;Functional mobility training;Therapeutic activities;Therapeutic exercise;Balance training;Patient/family education    PT Goals (Current goals can be found in the Care Plan section)  Acute Rehab PT Goals Patient Stated Goal: to improve strength and walking PT Goal Formulation: With patient Time For Goal Achievement: 09/03/23 Potential to Achieve Goals: Good    Frequency Min 1X/week     Co-evaluation               AM-PAC PT "6 Clicks" Mobility  Outcome Measure Help needed turning from your back to your side while in a flat bed without using bedrails?: None Help needed moving from lying on your back to sitting on the side of a flat bed without using bedrails?: A Little Help needed moving to and from a bed to a chair (including a wheelchair)?: A Little Help needed standing up from a chair using your arms (e.g., wheelchair or bedside chair)?: A Little Help needed to walk in hospital room?: A Little Help needed climbing 3-5 steps with a railing? : A Little 6 Click Score: 19    End of Session Equipment Utilized During Treatment: Gait belt Activity Tolerance: Patient limited by fatigue Patient left: in bed;with call bell/phone within reach;with bed alarm set Nurse Communication: Mobility status;Precautions;Other (comment);Patient requests pain meds (Pt's SpO2 sats during session) PT Visit Diagnosis: Other abnormalities of gait and mobility (R26.89);Muscle weakness (generalized) (M62.81);History of  falling (Z91.81);Pain Pain - part of body:  (abdomen)    Time: 0454-0981 PT Time Calculation (min) (ACUTE ONLY): 39 min   Charges:   PT Evaluation $PT Eval Low Complexity: 1 Low PT Treatments $Therapeutic Activity: 8-22 mins PT General Charges $$ ACUTE PT VISIT: 1 Visit        Hendricks Limes, PT 08/20/23, 6:26 PM

## 2023-08-20 NOTE — Assessment & Plan Note (Signed)
Last hemoglobin 9.6. 

## 2023-08-20 NOTE — Progress Notes (Signed)
PHARMACY CONSULT NOTE - ELECTROLYTES  Pharmacy Consult for Electrolyte Monitoring and Replacement   Recent Labs: Height: 5\' 11"  (180.3 cm) Weight: 68.1 kg (150 lb 2.1 oz) IBW/kg (Calculated) : 75.3 Estimated Creatinine Clearance: 61.2 mL/min (by C-G formula based on SCr of 1.19 mg/dL). Potassium (mmol/L)  Date Value  08/20/2023 3.1 (L)  03/11/2014 4.0   Magnesium (mg/dL)  Date Value  78/29/5621 2.1   Calcium (mg/dL)  Date Value  30/86/5784 8.2 (L)   Calcium, Total (mg/dL)  Date Value  69/62/9528 8.6   Albumin (g/dL)  Date Value  41/32/4401 2.5 (L)   Phosphorus (mg/dL)  Date Value  02/72/5366 2.1 (L)   Sodium (mmol/L)  Date Value  08/20/2023 128 (L)  03/11/2014 137   Assessment  Patrick Bailey. is a 63 y.o. male presenting with weakness, nausea, and vomiting. PMH significant for hypothyroidism, coronary artery disease, COPD. Pharmacy has been consulted to monitor and replace electrolytes.  Diet: FLD MIVF: N/A Pertinent medications: N/A  Goal of Therapy: Electrolytes WNL  Plan:  K 3.1, Phos 2.1: Kphos IV x 1 Check BMP, Mg, Phos with AM labs  Thank you for allowing pharmacy to be a part of this patient's care.  Bettey Costa, PharmD Clinical Pharmacist 08/20/2023 12:14 PM

## 2023-08-20 NOTE — Progress Notes (Signed)
Report called to care nurse Tiffany; noted room is not ready, still dirty. Will transfer when room is ready. Telemetry will be discontinued.

## 2023-08-20 NOTE — Assessment & Plan Note (Signed)
Start Flomax and continue coud catheter.

## 2023-08-20 NOTE — Plan of Care (Signed)
  Problem: Education: Goal: Knowledge of General Education information will improve Description: Including pain rating scale, medication(s)/side effects and non-pharmacologic comfort measures Outcome: Progressing   Problem: Health Behavior/Discharge Planning: Goal: Ability to manage health-related needs will improve Outcome: Progressing   Problem: Clinical Measurements: Goal: Ability to maintain clinical measurements within normal limits will improve Outcome: Progressing Goal: Will remain free from infection Outcome: Progressing Goal: Diagnostic test results will improve Outcome: Progressing Goal: Cardiovascular complication will be avoided Outcome: Progressing   Problem: Nutrition: Goal: Adequate nutrition will be maintained Outcome: Progressing   Problem: Coping: Goal: Level of anxiety will decrease Outcome: Progressing   Problem: Elimination: Goal: Will not experience complications related to bowel motility Outcome: Progressing Goal: Will not experience complications related to urinary retention Outcome: Progressing

## 2023-08-20 NOTE — Assessment & Plan Note (Signed)
Continue supplements

## 2023-08-20 NOTE — Assessment & Plan Note (Signed)
Follow-up with your oncologist as outpatient.

## 2023-08-20 NOTE — Assessment & Plan Note (Signed)
Hypokalemia, hypophosphatemia, hypomagnesemia and hyponatremia.  Patient on electrolyte protocol replacement.  Potassium normal range.  Will start salt tablets for sodium of 126.  On phosphorus replacement

## 2023-08-20 NOTE — Assessment & Plan Note (Signed)
Today's creatinine down to 1.19.  Creatinine on presentation 2.52.

## 2023-08-20 NOTE — Assessment & Plan Note (Signed)
Patient required high flow nasal cannula now down to 3 L.  Continue to try to taper to off.

## 2023-08-20 NOTE — Hospital Course (Signed)
Hospital course / significant events:   HPI: 63 year old man past medical history of hypertension, hyperlipidemia, right MCA stroke back in 2023, hypothyroidism, neuropathy, 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  Presented to the ER not feeling well with nasal congestion and night sweats.  On EMS arrival at home he was found to be hypotensive cold and unable to obtain SpO2 so he was placed on nonrebreather.  01/15: admitted to ICU with septic shock due to pneumonia, requiring pressors. NG in place. CT chest multifocal opacities, CT abdomen pelvis with distended stomach and fluid-filled esophagus likely gastroparesis in the setting of being chronically on opioids. 01/16: off pressors, BP still low.  01/17: Transferred to medicine service. Patient off pressors but blood pressure still on the lower side. Continue antibiotics for pneumonia.  Reviewed previous CT scan with general surgery and they believe that this is likely fluid collection from prior cancer rather than an abscess since it was seen on prior CT scan also.  01/18.  Patient felt dizzy with walking around today.  Did well with physical therapy yesterday, orthostatic today.  Gave a fluid bolus and started on low-dose midodrine.  Metoprolol discontinued yesterday.   01/19.  Still dizzy/orthostatic. Increasing midodrine to 10 mg 3 times daily.  01/20.  pain on urination but has a Foley catheter that looks like it is draining.  Asked nursing staff to flush.  Still having problems just with standing with physical therapist today.  Blood pressures and heart rates labile 01/21.  Patient still having some pain right lower quadrant.  Nursing staff flushed his Foley.  Patient states he is still symptomatic with standing up with dizziness and lightheadedness. CTA chest, no PE, also showed upper abdominal findings consistent with metastatic disease and peritoneal carcinomatosis. New free fluid within the left upper  quadrant since recent abdominal CT. 01/22: void trial today. Pt c/o pain at urethral meatus, Foley removed  01/23: minimal UOP and minimal urine on bladder scan, hydrate and monitor I&O. Pain is improved today      Consultants:  ICU Palliative Care / Oncology   Procedures/Surgeries: non      ASSESSMENT & PLAN:   Orthostatic hypotension Metoprolol discontinued 5 days ago.   A.m. cortisol normal range.   MRI of the brain did not show any acute abnormalities but did show prior subarachnoid hemorrhage.  Continue midodrine to 10 mg 3 times daily.   TED hose.   Advise continuing activity and working with physical therapy.   Hold Flomax. Will get CT scan of the chest to rule out PE.   Septic shock - resolved  Due to CAP / Multifocal pneumonia Present on admission with acute hypoxic respiratory failure, lactic acidosis, acute kidney injury, bilateral pneumonia, leukocytosis, hypotension and tachycardia.   Completed Zithromax.   Switched Maxipime and Flagyl over to Augmentin to complete 7 days.   Monitor sepsis paramters    Acute respiratory failure with hypoxia - resolved  Patient required high flow nasal cannula during the hospital course for hypoxia (pulse ox 87% on 6 L on 1/15).  Now breathing on room air.   check pulse ox with ambulation.   Appendix carcinoma  CT showed upper abdominal findings consistent with metastatic disease and peritoneal carcinomatosis.  Follow-up with oncologist as outpatient.   Chronic pain  PDMP reviewed: home meds recent Rx filled and adjustments as follows 01/01 Gabapentin 300 mg tid (#42 x14d) - continued here  01/01 Oxycodone IR 20 mg qid (#56  x14d) - has been on 10 mg qid here --> will increase to 20 mg qid scheduled  12/24 Hydromorphone 4 mg #30 x5d - will hold on hydromorphone here unless breakthrough and then can have 2 mg po   Electrolyte abnormality Hypokalemia, hypophosphatemia, hypomagnesemia and hyponatremia.   Continue salt  tablets for sodium of 132.   IV K-Phos for potassium and phosphorus being low.   AKI (acute kidney injury) - resolved Creatinine on presentation 2.52. Monitor BMP   Acute urinary retention Hold Flomax for orthostatic hypotension.   Started Proscar  D/c coud catheter yesterday Void trial today - not meeting goal, may need to replace Foley   Protein-calorie malnutrition, severe Continue supplements   Tachycardia Metoprolol held with orthostatic hypotension.    Anemia of chronic disease Last hemoglobin 9.6 Monitor CBC           DVT prophylaxis: heparin IV fluids: no continuous IV fluids  Nutrition: regular Central lines / invasive devices: port  Code Status: FULL CODE ACP documentation reviewed:  none on file in [facility]  TOC needs: home health Barriers to dispo / significant pending items: void trial today hydrate, if otherwise stable can d/c tomorrow w/ or w/o Foley

## 2023-08-20 NOTE — Assessment & Plan Note (Signed)
Switched Maxipime and Flagyl over to Augmentin to complete 7 days.  Completed Zithromax already.

## 2023-08-21 DIAGNOSIS — A419 Sepsis, unspecified organism: Secondary | ICD-10-CM | POA: Diagnosis not present

## 2023-08-21 DIAGNOSIS — I951 Orthostatic hypotension: Secondary | ICD-10-CM

## 2023-08-21 DIAGNOSIS — J9601 Acute respiratory failure with hypoxia: Secondary | ICD-10-CM | POA: Diagnosis not present

## 2023-08-21 DIAGNOSIS — J189 Pneumonia, unspecified organism: Secondary | ICD-10-CM | POA: Diagnosis not present

## 2023-08-21 LAB — CBC
HCT: 30.5 % — ABNORMAL LOW (ref 39.0–52.0)
Hemoglobin: 10 g/dL — ABNORMAL LOW (ref 13.0–17.0)
MCH: 26.1 pg (ref 26.0–34.0)
MCHC: 32.8 g/dL (ref 30.0–36.0)
MCV: 79.6 fL — ABNORMAL LOW (ref 80.0–100.0)
Platelets: 196 10*3/uL (ref 150–400)
RBC: 3.83 MIL/uL — ABNORMAL LOW (ref 4.22–5.81)
RDW: 16.8 % — ABNORMAL HIGH (ref 11.5–15.5)
WBC: 9.4 10*3/uL (ref 4.0–10.5)
nRBC: 0 % (ref 0.0–0.2)

## 2023-08-21 LAB — RENAL FUNCTION PANEL
Albumin: 2.5 g/dL — ABNORMAL LOW (ref 3.5–5.0)
Anion gap: 10 (ref 5–15)
BUN: 28 mg/dL — ABNORMAL HIGH (ref 8–23)
CO2: 26 mmol/L (ref 22–32)
Calcium: 8.4 mg/dL — ABNORMAL LOW (ref 8.9–10.3)
Chloride: 90 mmol/L — ABNORMAL LOW (ref 98–111)
Creatinine, Ser: 0.95 mg/dL (ref 0.61–1.24)
GFR, Estimated: >60 mL/min (ref >60)
Glucose, Bld: 91 mg/dL (ref 70–99)
Phosphorus: 1.5 mg/dL — ABNORMAL LOW (ref 2.5–4.6)
Potassium: 3.6 mmol/L (ref 3.5–5.1)
Sodium: 126 mmol/L — ABNORMAL LOW (ref 135–145)

## 2023-08-21 LAB — GLUCOSE, CAPILLARY
Glucose-Capillary: 83 mg/dL (ref 70–99)
Glucose-Capillary: 96 mg/dL (ref 70–99)
Glucose-Capillary: 99 mg/dL (ref 70–99)
Glucose-Capillary: 99 mg/dL (ref 70–99)

## 2023-08-21 LAB — MAGNESIUM: Magnesium: 1.7 mg/dL (ref 1.7–2.4)

## 2023-08-21 MED ORDER — K PHOS MONO-SOD PHOS DI & MONO 155-852-130 MG PO TABS
500.0000 mg | ORAL_TABLET | ORAL | Status: AC
  Administered 2023-08-21 (×3): 500 mg via ORAL
  Filled 2023-08-21 (×3): qty 2

## 2023-08-21 MED ORDER — SODIUM CHLORIDE 0.9 % IV BOLUS
500.0000 mL | Freq: Once | INTRAVENOUS | Status: AC
  Administered 2023-08-21: 500 mL via INTRAVENOUS

## 2023-08-21 MED ORDER — POTASSIUM CHLORIDE CRYS ER 20 MEQ PO TBCR
20.0000 meq | EXTENDED_RELEASE_TABLET | Freq: Once | ORAL | Status: AC
  Administered 2023-08-21: 20 meq via ORAL
  Filled 2023-08-21: qty 1

## 2023-08-21 MED ORDER — MIDODRINE HCL 5 MG PO TABS
5.0000 mg | ORAL_TABLET | Freq: Three times a day (TID) | ORAL | Status: DC
  Administered 2023-08-21 – 2023-08-22 (×2): 5 mg via ORAL
  Filled 2023-08-21 (×2): qty 1

## 2023-08-21 MED ORDER — MAGNESIUM SULFATE 2 GM/50ML IV SOLN
2.0000 g | Freq: Once | INTRAVENOUS | Status: AC
  Administered 2023-08-21: 2 g via INTRAVENOUS
  Filled 2023-08-21: qty 50

## 2023-08-21 MED ORDER — SODIUM CHLORIDE 1 G PO TABS
1.0000 g | ORAL_TABLET | Freq: Two times a day (BID) | ORAL | Status: DC
Start: 2023-08-21 — End: 2023-08-27
  Administered 2023-08-21 – 2023-08-27 (×12): 1 g via ORAL
  Filled 2023-08-21 (×12): qty 1

## 2023-08-21 NOTE — Progress Notes (Signed)
   08/21/23 1549  Assess: MEWS Score  Temp 97.9 F (36.6 C)  BP (!) 116/98  MAP (mmHg) 105  Pulse Rate (!) 118  Resp 18  SpO2 92 %  Assess: MEWS Score  MEWS Temp 0  MEWS Systolic 0  MEWS Pulse 2  MEWS RR 0  MEWS LOC 0  MEWS Score 2  MEWS Score Color Yellow  Assess: if the MEWS score is Yellow or Red  Were vital signs accurate and taken at a resting state? Yes  Does the patient meet 2 or more of the SIRS criteria? No  MEWS guidelines implemented  Yes, yellow  Treat  MEWS Interventions Considered administering scheduled or prn medications/treatments as ordered  Take Vital Signs  Increase Vital Sign Frequency  Yellow: Q2hr x1, continue Q4hrs until patient remains green for 12hrs  Escalate  MEWS: Escalate Yellow: Discuss with charge nurse and consider notifying provider and/or RRT  Notify: Charge Nurse/RN  Name of Charge Nurse/RN Notified Debi RN  Provider Notification  Provider Name/Title Dr Renae Gloss  Date Provider Notified 08/21/23  Time Provider Notified 1550  Provider response No new orders  Date of Provider Response 08/21/23  Time of Provider Response 1553  Assess: SIRS CRITERIA  SIRS Temperature  0  SIRS Respirations  0  SIRS Pulse 1  SIRS WBC 0  SIRS Score Sum  1

## 2023-08-21 NOTE — Progress Notes (Signed)
PHARMACY CONSULT NOTE - ELECTROLYTES  Pharmacy Consult for Electrolyte Monitoring and Replacement   Recent Labs: Height: 6' (182.9 cm) Weight: 72.3 kg (159 lb 6.3 oz) IBW/kg (Calculated) : 77.6 Estimated Creatinine Clearance: 81.4 mL/min (by C-G formula based on SCr of 0.95 mg/dL). Potassium (mmol/L)  Date Value  08/21/2023 3.6  03/11/2014 4.0   Magnesium (mg/dL)  Date Value  16/05/9603 1.7   Calcium (mg/dL)  Date Value  54/04/8118 8.4 (L)   Calcium, Total (mg/dL)  Date Value  14/78/2956 8.6   Albumin (g/dL)  Date Value  21/30/8657 2.5 (L)   Phosphorus (mg/dL)  Date Value  84/69/6295 1.5 (L)   Sodium (mmol/L)  Date Value  08/21/2023 126 (L)  03/11/2014 137   Corrected Ca: 9.6   (Ca 8.4,  albumin 2.5)  Assessment  Patrick Bailey. is a 63 y.o. male presenting with weakness, nausea, and vomiting. PMH significant for hypothyroidism, coronary artery disease, COPD. Pharmacy has been consulted to monitor and replace electrolytes.  Diet: FLD MIVF: N/A Pertinent medications: calcium tabs TID  Goal of Therapy: Electrolytes WNL  Plan:  K 3.6, Phos 1.5:  Will order KCL 20 meq po x 1 and KPhos tabs 500mg  po x 3 doses Check BMP, Mg, Phos with AM labs  Thank you for allowing pharmacy to be a part of this patient's care.  Angelique Blonder, PharmD Clinical Pharmacist 08/21/2023 10:12 AM

## 2023-08-21 NOTE — Assessment & Plan Note (Signed)
Metoprolol discontinued 4 days ago.  Continue midodrine to 10 mg 3 times daily.  TED hose.  Advise continuing activity and working with physical therapy.  Hold Flomax.  A.m. cortisol normal range.  MRI of the brain did not show any acute abnormalities but did show prior subarachnoid hemorrhage.  I did not see any orthostatic vital signs today.  I will give them check every 4 hours while awake.  Will get CT scan of the chest to rule out PE.

## 2023-08-21 NOTE — Evaluation (Signed)
Occupational Therapy Evaluation Patient Details Name: Patrick Bailey. MRN: [id number] DOB: [birth year] Today's Date: 08/21/2023   History of Present Illness Pt is a 63 y.o. male presenting to hospital 08/18/23 with c/o abdominal pain, emesis, nausea, SOB, and weakness.  Pt admitted with septic shock secondary to CAP and possible intraabdominal source.  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   Clinical Impression   Pt in bed upon OT arrival, finishing soup from lunch but agreeable to OT evaluation.  Pt reports he has been staying with his dad over the last 2 weeks d/t requiring increased assist with self care, but previously living own his own.  Pt stood at bedside with supv and RW, but declined additional mobility to bathroom d/t feeling tired.  Pt presents with decreased activity tolerance and dyspnea, noting 86-88% despite vc for PLB.  RN notified as pt acknowledged feeling SOB and inquired about supplemental 02.  OT reinforced EC strategies, including slow positional changes and avoiding rushing through ADLs and mobility.  OT made recommendation for 3in1 as pt feels he will likely need sitting support to complete his bathing upon return home.  Pt receptive to 3in1.   Pt will continue to benefit from skilled OT in the acute setting to maximize indep and safety with ADLs and functional mobility.  Recommend HH OT upon d/c.        If plan is discharge home, recommend the following: A little help with walking and/or transfers;A little help with bathing/dressing/bathroom;Assist for transportation;Assistance with cooking/housework    Functional Status Assessment  Patient has had a recent decline in their functional status and demonstrates the ability to make significant improvements in function in a reasonable and predictable amount of time.  Equipment Recommendations  BSC/3in1    Recommendations for  Other Services       Precautions / Restrictions Precautions Precautions: Fall Restrictions Weight Bearing Restrictions Per Provider Order: No      Mobility Bed Mobility Overal bed mobility: Needs Assistance Bed Mobility: Supine to Sit, Sit to Supine     Supine to sit: Supervision, HOB elevated Sit to supine: Supervision, HOB elevated   General bed mobility comments: SBA for lines Patient Response: Cooperative  Transfers Overall transfer level: Needs assistance Equipment used: Rolling walker (2 wheels) Transfers: Sit to/from Stand Sit to Stand: Supervision           General transfer comment: quick transitions in/out of bed; pt verbalized SOB.  OT reinforced importance of slow positional changes for EC.      Balance Overall balance assessment: Needs assistance Sitting-balance support: No upper extremity supported, Feet supported Sitting balance-Leahy Scale: Good     Standing balance support: Single extremity supported Standing balance-Leahy Scale: Fair                             ADL either performed or assessed with clinical judgement   ADL Overall ADL's : Needs assistance/impaired Eating/Feeding: Set up Eating/Feeding Details (indicate cue type and reason): pt reports ongoing heartburn                                 Functional mobility during ADLs: Supervision/safety;Rolling walker (2 wheels) General ADL Comments: agreeable for transfer at bedside, but not to ambulate; denied need to use toilet     Vision  Patient Visual Report: No change from baseline       Perception         Praxis         Pertinent Vitals/Pain Pain Assessment Pain Assessment: Faces Faces Pain Scale: Hurts even more Pain Location: abdominal pain, and "all over," though pt couldn't verbalize a pain number Pain Descriptors / Indicators: Aching Pain Intervention(s): Limited activity within patient's tolerance, Monitored during session, Repositioned      Extremity/Trunk Assessment Upper Extremity Assessment Upper Extremity Assessment: Overall WFL for tasks assessed   Lower Extremity Assessment Lower Extremity Assessment: Generalized weakness       Communication Communication Communication: No apparent difficulties   Cognition Arousal: Alert Behavior During Therapy: WFL for tasks assessed/performed Overall Cognitive Status: Within Functional Limits for tasks assessed                                       General Comments  02 sats between 86 and 88% on room air at rest and with activity.  Vc for PLB with pt reaching only 88%.  RN notified of 02 sats and pt reporting feeling SOB and pt requesting 02.    Exercises Other Exercises Other Exercises: Educ on OT role, goals, poc   Shoulder Instructions      Home Living Family/patient expects to be discharged to:: Private residence Living Arrangements: Parent Available Help at Discharge: Family;Available PRN/intermittently Type of Home: House Home Access: Stairs to enter [employer] of Steps: 3 (from back entrance) Entrance Stairs-Rails: Left Home Layout: Two level;Full bath on main level;Bed/bath upstairs     Bathroom Shower/Tub: Chief Strategy Officer: Standard     Home Equipment: Grab bars - tub/shower;Rolling Walker (2 wheels)   Additional Comments: Living upstairs in dad's home for the last 2 weeks as pt was needing more help to care for himself.      Prior Functioning/Environment Prior Level of Function : Independent/Modified Independent             Mobility Comments: Independent with ambulation; recent falls reported. ADLs Comments: Dad assisting with his care over the last 2 weeks, but prior to that pt was indep in his own home        OT Problem List: Decreased strength;Decreased activity tolerance;Cardiopulmonary status limiting activity;Impaired balance (sitting and/or standing);Pain      OT  Treatment/Interventions: Self-care/ADL training;Therapeutic exercise;Patient/family education;Balance training;Energy conservation;Therapeutic activities;DME and/or AE instruction    OT Goals(Current goals can be found in the care plan section) Acute Rehab OT Goals Patient Stated Goal: To go home OT Goal Formulation: With patient Time For Goal Achievement: 09/04/23 Potential to Achieve Goals: Good ADL Goals Pt Will Perform Grooming: with set-up;standing Pt Will Transfer to Toilet: with supervision;ambulating;grab bars Additional ADL Goal #1: Pt will be indep to verbalize and demo 1-2 EC strategies to improve tolerance to ADLs and functional mobility.  OT Frequency: Min 1X/week                  AM-PAC OT "6 Clicks" Daily Activity     Outcome Measure Help from another person eating meals?: None Help from another person taking care of personal grooming?: A Little Help from another person toileting, which includes using toliet, bedpan, or urinal?: A Little Help from another person bathing (including washing, rinsing, drying)?: A Little Help from another person to put on and taking off regular upper body clothing?:  None Help from another person to put on and taking off regular lower body clothing?: A Little 6 Click Score: 20   End of Session Equipment Utilized During Treatment: Rolling walker (2 wheels) Nurse Communication: Other (comment) (02 sats)  Activity Tolerance: Patient limited by pain;Patient limited by fatigue Patient left: in bed;with call bell/phone within reach  OT Visit Diagnosis: Unsteadiness on feet (R26.81);Muscle weakness (generalized) (M62.81)                Time: 1347-1410 OT Time Calculation (min): 23 min Charges:  OT General Charges $OT Visit: 1 Visit OT Evaluation $OT Eval Low Complexity: 1 Low OT Treatments $Self Care/Home Management : 8-22 mins  Danelle Earthly, MS, OTR/L   [facility] Dials 08/21/2023, 2:29 PM

## 2023-08-21 NOTE — Progress Notes (Signed)
Progress Note   Patient: Patrick Bailey. ZOX:[id number] DOB: 03-Aug-1961 DOA: 08/18/2023     3 DOS: the patient was seen and examined on 08/21/2023   Brief hospital course: 63 year old man past medical history of hypertension, hyperlipidemia, right MCA stroke back in 2023, hypothyroidism, neuropathy, 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  Presented to the ER not feeling well with nasal congestion and night sweats.  Patient was found to be hypotensive and called and unable to obtain a pulse ox.  He was started on high flow nasal cannula and admitted with septic shock requiring pressors.  Patient started on antibiotics secondary to bilateral pneumonia.  1/17.  Transferred to medicine service.  Patient once the NG tube out.  Patient off pressors but blood pressure still on the lower side.  Will hold metoprolol.  Continue antibiotics for pneumonia.  Reviewed previous CT scan with general surgery and they believe that this is likely fluid collection from prior cancer rather than an abscess since it was seen on prior CT scan also.  Will transfer out of ICU. 1/18.  Patient felt dizzy with walking around today.  Did well with physical therapy yesterday.  Patient orthostatic today.  Will give a fluid bolus and started on low-dose midodrine.  Metoprolol discontinued yesterday.  Replacing phosphorus today.  Assessment and Plan: Septic shock (HCC) Present on admission with acute hypoxic respiratory failure, lactic acidosis, acute kidney injury, bilateral pneumonia, leukocytosis, hypotension and tachycardia.  Currently on Maxipime and Flagyl.  Completed Zithromax.  White blood cell count normal range.  Multifocal pneumonia On Maxipime.  Completed Zithromax.  Orthostatic hypotension Fluid bolus.  Metoprolol discontinued yesterday.  Start low-dose midodrine.  Acute respiratory failure with hypoxia [facility]) Patient required high flow nasal cannula during the hospital course  for hypoxia (pulse ox 87% on 6 L on 1/15).  Now breathing on room air.  Appendix carcinoma [facility]) Follow-up with your oncologist as outpatient.  Electrolyte abnormality Hypokalemia, hypophosphatemia, hypomagnesemia and hyponatremia.  Patient on electrolyte protocol replacement.  Potassium normal range.  Will start salt tablets for sodium of 126.  On phosphorus replacement  AKI (acute kidney injury) (HCC) Today's creatinine down to 0.95.  Creatinine on presentation 2.52.  Acute urinary retention Continue Flomax and continue coud catheter.  Protein-calorie malnutrition, severe Continue supplements  Anemia of chronic disease Last hemoglobin 10.0        Subjective: Patient felt a little lightheaded and dizzy with walking back from the bathroom.  Patient orthostatic.  Will start low-dose midodrine and give a fluid bolus.  Physical Exam: Vitals:   08/21/23 0500 08/21/23 0919 08/21/23 1148 08/21/23 1549  BP:  (!) 125/94 112/79 (!) 116/98  Pulse:  (!) 107 99 (!) 118  Resp:  16 20 18   Temp:  98.2 F (36.8 C) 98.1 F (36.7 C) 97.9 F (36.6 C)  TempSrc:      SpO2:  90% 96% 92%  Weight: 72.3 kg     Height:       Physical Exam HENT:     Head: Normocephalic.     Mouth/Throat:     Pharynx: No oropharyngeal exudate.  Eyes:     General: Lids are normal.     Conjunctiva/sclera: Conjunctivae normal.  Cardiovascular:     Rate and Rhythm: Regular rhythm. Tachycardia present.     Heart sounds: Normal heart sounds, S1 normal and S2 normal.  Pulmonary:     Breath sounds: Examination of the right-lower field reveals decreased breath sounds.  Examination of the left-lower field reveals decreased breath sounds. Decreased breath sounds present. No wheezing, rhonchi or rales.  Abdominal:     Palpations: Abdomen is soft.     Tenderness: There is abdominal tenderness in the right lower quadrant.  Musculoskeletal:     Right lower leg: No swelling.     Left lower leg: No swelling.  Skin:     General: Skin is warm.     Findings: No rash.  Neurological:     Mental Status: He is alert and oriented to person, place, and time.     Data Reviewed: Sodium 126, creatinine 0.95, phosphorus 1.5, magnesium 1.7, hemoglobin 10.0, white blood count 9.4, platelet count 196  Family Communication: Left message for father  Disposition: Status is: Inpatient Remains inpatient appropriate because: Will start low-dose midodrine and give a fluid bolus for orthostatic hypotension  Planned Discharge Destination: Home with Home Health    Time spent: 28 minutes  Author: Alford Highland, MD 08/21/2023 4:05 PM  For on call review [email or web address].

## 2023-08-22 DIAGNOSIS — J9601 Acute respiratory failure with hypoxia: Secondary | ICD-10-CM | POA: Diagnosis not present

## 2023-08-22 DIAGNOSIS — I951 Orthostatic hypotension: Secondary | ICD-10-CM | POA: Diagnosis not present

## 2023-08-22 DIAGNOSIS — J189 Pneumonia, unspecified organism: Secondary | ICD-10-CM | POA: Diagnosis not present

## 2023-08-22 DIAGNOSIS — A419 Sepsis, unspecified organism: Secondary | ICD-10-CM | POA: Diagnosis not present

## 2023-08-22 LAB — RENAL FUNCTION PANEL
Albumin: 2.4 g/dL — ABNORMAL LOW (ref 3.5–5.0)
Anion gap: 11 (ref 5–15)
BUN: 20 mg/dL (ref 8–23)
CO2: 24 mmol/L (ref 22–32)
Calcium: 8.1 mg/dL — ABNORMAL LOW (ref 8.9–10.3)
Chloride: 94 mmol/L — ABNORMAL LOW (ref 98–111)
Creatinine, Ser: 0.77 mg/dL (ref 0.61–1.24)
GFR, Estimated: >60 mL/min (ref >60)
Glucose, Bld: 79 mg/dL (ref 70–99)
Phosphorus: 1.7 mg/dL — ABNORMAL LOW (ref 2.5–4.6)
Potassium: 3.6 mmol/L (ref 3.5–5.1)
Sodium: 129 mmol/L — ABNORMAL LOW (ref 135–145)

## 2023-08-22 LAB — CBC
HCT: 26.8 % — ABNORMAL LOW (ref 39.0–52.0)
Hemoglobin: 9 g/dL — ABNORMAL LOW (ref 13.0–17.0)
MCH: 26.2 pg (ref 26.0–34.0)
MCHC: 33.6 g/dL (ref 30.0–36.0)
MCV: 77.9 fL — ABNORMAL LOW (ref 80.0–100.0)
Platelets: 184 10*3/uL (ref 150–400)
RBC: 3.44 MIL/uL — ABNORMAL LOW (ref 4.22–5.81)
RDW: 17.1 % — ABNORMAL HIGH (ref 11.5–15.5)
WBC: 5.9 10*3/uL (ref 4.0–10.5)
nRBC: 0 % (ref 0.0–0.2)

## 2023-08-22 LAB — LEGIONELLA PNEUMOPHILA SEROGP 1 UR AG: L. pneumophila Serogp 1 Ur Ag: NEGATIVE

## 2023-08-22 LAB — MAGNESIUM: Magnesium: 1.8 mg/dL (ref 1.7–2.4)

## 2023-08-22 LAB — GLUCOSE, CAPILLARY
Glucose-Capillary: 85 mg/dL (ref 70–99)
Glucose-Capillary: 72 mg/dL (ref 70–99)

## 2023-08-22 MED ORDER — CHLORHEXIDINE GLUCONATE CLOTH 2 % EX PADS
6.0000 | MEDICATED_PAD | Freq: Every day | CUTANEOUS | Status: DC
  Administered 2023-08-22 – 2023-08-25 (×4): 6 via TOPICAL

## 2023-08-22 MED ORDER — MIDODRINE HCL 5 MG PO TABS
10.0000 mg | ORAL_TABLET | Freq: Three times a day (TID) | ORAL | Status: DC
  Administered 2023-08-22 – 2023-08-27 (×16): 10 mg via ORAL
  Filled 2023-08-22 (×16): qty 2

## 2023-08-22 MED ORDER — MAGNESIUM SULFATE 2 GM/50ML IV SOLN
2.0000 g | Freq: Once | INTRAVENOUS | Status: AC
  Administered 2023-08-22: 2 g via INTRAVENOUS
  Filled 2023-08-22: qty 50

## 2023-08-22 MED ORDER — SODIUM CHLORIDE 0.9 % IV BOLUS
500.0000 mL | Freq: Once | INTRAVENOUS | Status: AC
  Administered 2023-08-22: 500 mL via INTRAVENOUS

## 2023-08-22 MED ORDER — K PHOS MONO-SOD PHOS DI & MONO 155-852-130 MG PO TABS
500.0000 mg | ORAL_TABLET | ORAL | Status: AC
  Administered 2023-08-22 (×3): 500 mg via ORAL
  Filled 2023-08-22 (×3): qty 2

## 2023-08-22 MED ORDER — FINASTERIDE 5 MG PO TABS
5.0000 mg | ORAL_TABLET | Freq: Every evening | ORAL | Status: DC
  Administered 2023-08-22 – 2023-08-26 (×5): 5 mg via ORAL
  Filled 2023-08-22 (×5): qty 1

## 2023-08-22 MED ORDER — POTASSIUM CHLORIDE CRYS ER 20 MEQ PO TBCR
20.0000 meq | EXTENDED_RELEASE_TABLET | Freq: Once | ORAL | Status: AC
  Administered 2023-08-22: 20 meq via ORAL
  Filled 2023-08-22: qty 1

## 2023-08-22 NOTE — Progress Notes (Signed)
Progress Note   Patient: Patrick Bailey. FAO:[id number] DOB: 08/03/1961 DOA: 08/18/2023     4 DOS: the patient was seen and examined on 08/22/2023   Brief hospital course: 63 year old man past medical history of hypertension, hyperlipidemia, right MCA stroke back in 2023, hypothyroidism, neuropathy, 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  Presented to the ER not feeling well with nasal congestion and night sweats.  Patient was found to be hypotensive and called and unable to obtain a pulse ox.  He was started on high flow nasal cannula and admitted with septic shock requiring pressors.  Patient started on antibiotics secondary to bilateral pneumonia.  1/17.  Transferred to medicine service.  Patient once the NG tube out.  Patient off pressors but blood pressure still on the lower side.  Will hold metoprolol.  Continue antibiotics for pneumonia.  Reviewed previous CT scan with general surgery and they believe that this is likely fluid collection from prior cancer rather than an abscess since it was seen on prior CT scan also.  Will transfer out of ICU. 1/18.  Patient felt dizzy with walking around today.  Did well with physical therapy yesterday.  Patient orthostatic today.  Will give a fluid bolus and started on low-dose midodrine.  Metoprolol discontinued yesterday.  Replacing phosphorus today. 1/19.  Still feeling dizzy today.  Still orthostatic.  Increasing midodrine to 10 mg 3 times daily.  Continue to replace phosphorus.  Assessment and Plan: * Orthostatic hypotension Fluid bolus.  Metoprolol discontinued 2 days ago.  Increase midodrine to 10 mg 3 times daily.  TED hose.  Advise continuing activity.  Hold Flomax.  Septic shock (HCC) Present on admission with acute hypoxic respiratory failure, lactic acidosis, acute kidney injury, bilateral pneumonia, leukocytosis, hypotension and tachycardia.  Currently on Maxipime and Flagyl.  Completed Zithromax.  White  blood cell count normal range.  Multifocal pneumonia On Maxipime.  Completed Zithromax.  Acute respiratory failure with hypoxia [facility]) Patient required high flow nasal cannula during the hospital course for hypoxia (pulse ox 87% on 6 L on 1/15).  Now breathing on room air.  Would like to check pulse ox with ambulation.  Appendix carcinoma [facility]) Follow-up with your oncologist as outpatient.  Electrolyte abnormality Hypokalemia, hypophosphatemia, hypomagnesemia and hyponatremia.  Patient on electrolyte protocol replacement.  Potassium normal range.  Continue salt tablets for sodium of 129.  On phosphorus replacement again today.  AKI (acute kidney injury) (HCC) Today's creatinine down to 0.77.  Creatinine on presentation 2.52.  Acute urinary retention Hold Flomax for orthostatic hypotension.  Start Proscar and continue coud catheter.  Protein-calorie malnutrition, severe Continue supplements  Anemia of chronic disease Last hemoglobin 10.0        Subjective: Patient still feeling dizzy.  Still orthostatic with standing.  Will increase midodrine to 10 mg 3 times daily.  Physical Exam: Vitals:   08/22/23 0451 08/22/23 0818 08/22/23 0930 08/22/23 0937  BP: 114/77 120/77 98/77 (!) 120/93  Pulse: (!) 106 (!) 105 (!) 114 (!) 125  Resp: 18 17    Temp: 98.1 F (36.7 C) 98 F (36.7 C)    TempSrc:  Oral    SpO2: 93% 93%    Weight:      Height:       Physical Exam HENT:     Head: Normocephalic.     Mouth/Throat:     Pharynx: No oropharyngeal exudate.  Eyes:     General: Lids are normal.  Conjunctiva/sclera: Conjunctivae normal.  Cardiovascular:     Rate and Rhythm: Regular rhythm. Tachycardia present.     Heart sounds: Normal heart sounds, S1 normal and S2 normal.  Pulmonary:     Breath sounds: Examination of the right-lower field reveals decreased breath sounds. Examination of the left-lower field reveals decreased breath sounds. Decreased breath sounds present. No  wheezing, rhonchi or rales.  Abdominal:     Palpations: Abdomen is soft.     Tenderness: There is abdominal tenderness in the right lower quadrant.  Musculoskeletal:     Right lower leg: No swelling.     Left lower leg: No swelling.  Skin:    General: Skin is warm.     Findings: No rash.  Neurological:     Mental Status: He is alert and oriented to person, place, and time.     Data Reviewed: White blood count 5.9, hemoglobin 9.0, platelet count 184, sodium 129, potassium 3.6, creatinine 0.77, phosphorus 1.7  Family Communication: Updated sister on the phone  Disposition: Status is: Inpatient Remains inpatient appropriate because: Still orthostatic today.  Increase midodrine to 10 mg 3 times daily  Planned Discharge Destination: Home with Home Health    Time spent: 28 minutes  Author: Alford Highland, MD 08/22/2023 4:19 PM  For on call review [email or web address].

## 2023-08-22 NOTE — Progress Notes (Signed)
PHARMACY CONSULT NOTE - ELECTROLYTES  Pharmacy Consult for Electrolyte Monitoring and Replacement   Recent Labs: Height: 6' (182.9 cm) Weight: 72.3 kg (159 lb 6.3 oz) IBW/kg (Calculated) : 77.6 Estimated Creatinine Clearance: 96.7 mL/min (by C-G formula based on SCr of 0.77 mg/dL). Potassium (mmol/L)  Date Value  08/22/2023 3.6  03/11/2014 4.0   Magnesium (mg/dL)  Date Value  82/95/6213 1.8   Calcium (mg/dL)  Date Value  08/65/7846 8.1 (L)   Calcium, Total (mg/dL)  Date Value  96/29/5284 8.6   Albumin (g/dL)  Date Value  13/24/4010 2.4 (L)   Phosphorus (mg/dL)  Date Value  27/25/3664 1.7 (L)   Sodium (mmol/L)  Date Value  08/22/2023 129 (L)  03/11/2014 137   Corrected Ca: 9.6   (Ca 8.4,  albumin 2.5)  Assessment  Patrick Bailey. is a 64 y.o. male presenting with weakness, nausea, and vomiting. PMH significant for hypothyroidism, coronary artery disease, COPD. Pharmacy has been consulted to monitor and replace electrolytes.  Diet: FLD MIVF: N/A Pertinent medications: calcium tabs TID  Goal of Therapy: Electrolytes WNL  Plan:  K 3.6, Phos 1.7:  Will order KCL 20 meq po x 1 MD ordered KPhos tabs 500mg  po q4h x 3 doses Mag 1.8  Will order Magnesium 2 gm IV x1 Check BMP, Mg, Phos with AM labs  Thank you for allowing pharmacy to be a part of this patient's care.  Angelique Blonder, PharmD Clinical Pharmacist 08/22/2023 9:52 AM

## 2023-08-22 NOTE — Plan of Care (Signed)
  Problem: Education: Goal: Knowledge of General Education information will improve Description: Including pain rating scale, medication(s)/side effects and non-pharmacologic comfort measures Outcome: Not Progressing   

## 2023-08-23 ENCOUNTER — Inpatient Hospital Stay: Payer: BC Managed Care – PPO

## 2023-08-23 DIAGNOSIS — I951 Orthostatic hypotension: Secondary | ICD-10-CM | POA: Diagnosis not present

## 2023-08-23 DIAGNOSIS — J189 Pneumonia, unspecified organism: Secondary | ICD-10-CM | POA: Diagnosis not present

## 2023-08-23 DIAGNOSIS — J9601 Acute respiratory failure with hypoxia: Secondary | ICD-10-CM | POA: Diagnosis not present

## 2023-08-23 DIAGNOSIS — A419 Sepsis, unspecified organism: Secondary | ICD-10-CM | POA: Diagnosis not present

## 2023-08-23 LAB — RENAL FUNCTION PANEL
Albumin: 2.2 g/dL — ABNORMAL LOW (ref 3.5–5.0)
Anion gap: 10 (ref 5–15)
BUN: 15 mg/dL (ref 8–23)
CO2: 24 mmol/L (ref 22–32)
Calcium: 8.1 mg/dL — ABNORMAL LOW (ref 8.9–10.3)
Chloride: 98 mmol/L (ref 98–111)
Creatinine, Ser: 0.78 mg/dL (ref 0.61–1.24)
GFR, Estimated: >60 mL/min (ref >60)
Glucose, Bld: 87 mg/dL (ref 70–99)
Phosphorus: 1.8 mg/dL — ABNORMAL LOW (ref 2.5–4.6)
Potassium: 3.3 mmol/L — ABNORMAL LOW (ref 3.5–5.1)
Sodium: 132 mmol/L — ABNORMAL LOW (ref 135–145)

## 2023-08-23 LAB — MAGNESIUM: Magnesium: 1.8 mg/dL (ref 1.7–2.4)

## 2023-08-23 LAB — CULTURE, BLOOD (ROUTINE X 2)
Culture: NO GROWTH
Culture: NO GROWTH
Special Requests: ADEQUATE

## 2023-08-23 LAB — HEMOGLOBIN: Hemoglobin: 9.6 g/dL — ABNORMAL LOW (ref 13.0–17.0)

## 2023-08-23 MED ORDER — AMOXICILLIN-POT CLAVULANATE 875-125 MG PO TABS
1.0000 | ORAL_TABLET | Freq: Two times a day (BID) | ORAL | Status: AC
  Administered 2023-08-23 – 2023-08-24 (×3): 1 via ORAL
  Filled 2023-08-23 (×3): qty 1

## 2023-08-23 MED ORDER — ALUM & MAG HYDROXIDE-SIMETH 200-200-20 MG/5ML PO SUSP
30.0000 mL | Freq: Once | ORAL | Status: AC
  Administered 2023-08-23: 30 mL via ORAL
  Filled 2023-08-23: qty 30

## 2023-08-23 MED ORDER — POTASSIUM PHOSPHATES 15 MMOLE/5ML IV SOLN
30.0000 mmol | Freq: Once | INTRAVENOUS | Status: AC
  Administered 2023-08-23: 30 mmol via INTRAVENOUS
  Filled 2023-08-23: qty 10

## 2023-08-23 NOTE — Plan of Care (Signed)

## 2023-08-23 NOTE — Progress Notes (Signed)
Foley and peri care completed.

## 2023-08-23 NOTE — Consult Note (Signed)
[facility] Liaison Note  08/23/2023  Patrick Bailey [birth year] [id number]  Location: RN Hospital Liaison screened the patient remotely at [facility].  Insurance: [employer] Patrick Bailey. is a 63 y.o. male who is a Primary Care Patient of Patrick Boyden, MD-[facility] at [facility]. The patient was screened for readmission hospitalization with noted extreme risk score for unplanned readmission risk with 2 IP/2 ED in 6 months.  The patient was assessed for potential Care Management service needs for post hospital transition for care coordination. Review of patient's electronic medical record reveals patient was admitted for Orthostatic hypotension. Pt followed heavily by the oncology team. No anticipated VBCI needs presented at this time.  Plan: [facility] Liaison will continue to follow progress and disposition to asess for post hospital community care coordination/management needs.  Referral request for community care coordination: anticipate Transitions of Care Team follow up.   VBCI Care Management/Population Health does not replace or interfere with any arrangements made by the Inpatient Transition of Care team.   For questions contact:   Elliot Cousin, RN, [facility] Liaison [place]   [facility], Population Health Office Hours MTWF  8:00 am-6:00 pm Direct Dial: [phone] mobile [phone] [Office toll free line] Office Hours are M-F 8:30 - 5 pm [name]

## 2023-08-23 NOTE — Progress Notes (Signed)
Physical Therapy Treatment Patient Details Name: Patrick Bailey. MRN: [id number] DOB: 07/31/1961 Today's Date: 08/23/2023   History of Present Illness Pt is a 63 y.o. male presenting to hospital 08/18/23 with c/o abdominal pain, emesis, nausea, SOB, and weakness.  Pt admitted with septic shock secondary to CAP and possible intraabdominal source.  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    PT Comments  Pt reported ongoing abdominal pain, RN notified of pt request for pain medication. Session limited due to pt reported dizziness; stated its only when he sits up or tries to get out of bed, endorsed feeling like the room was moving, no previous episodes with dizziness. BP assessed with each position change; 133/104 at rest, 120/87 with initial sitting that improved to 141/104, and then again to 127/102. Pt unable to stand long enough to obtain a standing reading due to symptoms. Pt returned to bed (supervision for bed mobility and sit <> stand with RW), needs in reach. The patient would benefit from further skilled PT intervention to continue to progress towards goals.    HR 120s-130s with standing, RN/MD notified of pt symptoms and vitals.     If plan is discharge home, recommend the following: A little help with walking and/or transfers;A little help with bathing/dressing/bathroom;Assistance with cooking/housework;Assist for transportation;Help with stairs or ramp for entrance   Can travel by private vehicle        Equipment Recommendations  Rolling walker (2 wheels);BSC/3in1    Recommendations for Other Services       Precautions / Restrictions Precautions Precautions: Fall Precaution Comments: R chest port Restrictions Weight Bearing Restrictions Per Provider Order: No     Mobility  Bed Mobility Overal bed mobility: Needs Assistance Bed Mobility: Supine to Sit, Sit to Supine      Supine to sit: Supervision, HOB elevated Sit to supine: Supervision, HOB elevated        Transfers Overall transfer level: Needs assistance Equipment used: Rolling walker (2 wheels) Transfers: Sit to/from Stand Sit to Stand: Supervision           General transfer comment: unable to tolerate standing >1 minute due to dizziness    Ambulation/Gait               General Gait Details: unable to due dizziness, weakness   Stairs             Wheelchair Mobility     Tilt Bed    Modified Rankin (Stroke Patients Only)       Balance Overall balance assessment: Needs assistance Sitting-balance support: No upper extremity supported, Feet supported Sitting balance-Leahy Scale: Good     Standing balance support: Bilateral upper extremity supported Standing balance-Leahy Scale: Poor                              Cognition Arousal: Alert Behavior During Therapy: WFL for tasks assessed/performed Overall Cognitive Status: Within Functional Limits for tasks assessed                                          Exercises      General Comments        Pertinent Vitals/Pain Pain Assessment Pain Score: 7  Pain Location: abdominal pain Pain Descriptors / Indicators: Aching,  Sore, Sharp Pain Intervention(s): Limited activity within patient's tolerance, Monitored during session, Repositioned, Patient requesting pain meds-RN notified    Home Living                          Prior Function            PT Goals (current goals can now be found in the care plan section) Progress towards PT goals: Progressing toward goals    Frequency    Min 1X/week      PT Plan      Co-evaluation              AM-PAC PT "6 Clicks" Mobility   Outcome Measure  Help needed turning from your back to your side while in a flat bed without using bedrails?: None Help needed moving from lying on your back to sitting on the side of a  flat bed without using bedrails?: None Help needed moving to and from a bed to a chair (including a wheelchair)?: A Little Help needed standing up from a chair using your arms (e.g., wheelchair or bedside chair)?: A Little Help needed to walk in hospital room?: A Lot Help needed climbing 3-5 steps with a railing? : A Lot 6 Click Score: 18    End of Session   Activity Tolerance: Patient limited by fatigue Patient left: in bed;with call bell/phone within reach;with bed alarm set Nurse Communication: Mobility status;Other (comment);Patient requests pain meds (pt HR during mobility) PT Visit Diagnosis: Other abnormalities of gait and mobility (R26.89);Muscle weakness (generalized) (M62.81);History of falling (Z91.81);Pain Pain - part of body:  (abdomen)     Time: 7425-9563 PT Time Calculation (min) (ACUTE ONLY): 26 min  Charges:    $Therapeutic Activity: 23-37 mins PT General Charges $$ ACUTE PT VISIT: 1 Visit                     Olga Coaster PT, DPT 3:35 PM,08/23/23

## 2023-08-23 NOTE — Progress Notes (Signed)
Progress Note   Patient: Patrick Bailey. CHE:[id number] DOB: 12-24-1960 DOA: 08/18/2023     5 DOS: the patient was seen and examined on 08/23/2023   Brief hospital course: 63 year old man past medical history of hypertension, hyperlipidemia, right MCA stroke back in 2023, hypothyroidism, neuropathy, 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  Presented to the ER not feeling well with nasal congestion and night sweats.  Patient was found to be hypotensive and called and unable to obtain a pulse ox.  He was started on high flow nasal cannula and admitted with septic shock requiring pressors.  Patient started on antibiotics secondary to bilateral pneumonia.  1/17.  Transferred to medicine service.  Patient once the NG tube out.  Patient off pressors but blood pressure still on the lower side.  Will hold metoprolol.  Continue antibiotics for pneumonia.  Reviewed previous CT scan with general surgery and they believe that this is likely fluid collection from prior cancer rather than an abscess since it was seen on prior CT scan also.  Will transfer out of ICU. 1/18.  Patient felt dizzy with walking around today.  Did well with physical therapy yesterday.  Patient orthostatic today.  Will give a fluid bolus and started on low-dose midodrine.  Metoprolol discontinued yesterday.  Replacing phosphorus today. 1/19.  Still feeling dizzy today.  Still orthostatic.  Increasing midodrine to 10 mg 3 times daily.  Continue to replace phosphorus. 1/20.  Patient was having pain on urination but has a Foley catheter that looks like it is draining.  Asked nursing staff to flush.  Still having problems just with standing with physical therapist today.  Blood pressures and heart rates all over the place.  Assessment and Plan: * Orthostatic hypotension Fluid bolus.  Metoprolol discontinued 2 days ago.  Increase midodrine to 10 mg 3 times daily.  TED hose.  Advise continuing activity.  Hold  Flomax.  Septic shock (HCC) Present on admission with acute hypoxic respiratory failure, lactic acidosis, acute kidney injury, bilateral pneumonia, leukocytosis, hypotension and tachycardia.  Currently on Maxipime and Flagyl.  Completed Zithromax.  White blood cell count normal range.  Multifocal pneumonia On Maxipime.  Completed Zithromax.  Acute respiratory failure with hypoxia [facility]) Patient required high flow nasal cannula during the hospital course for hypoxia (pulse ox 87% on 6 L on 1/15).  Now breathing on room air.  Would like to check pulse ox with ambulation.  Appendix carcinoma [facility]) Follow-up with your oncologist as outpatient.  Electrolyte abnormality Hypokalemia, hypophosphatemia, hypomagnesemia and hyponatremia.  Patient on electrolyte protocol replacement.  Potassium normal range.  Continue salt tablets for sodium of 129.  On phosphorus replacement again today.  AKI (acute kidney injury) (HCC) Today's creatinine down to 0.77.  Creatinine on presentation 2.52.  Acute urinary retention Hold Flomax for orthostatic hypotension.  Start Proscar and continue coud catheter.  Protein-calorie malnutrition, severe Continue supplements  Anemia of chronic disease Last hemoglobin 10.0        Subjective: Patient still feeling dizzy with standing up.  On midodrine for orthostatic hypotension.  With physical therapist blood pressures and heart rates were all over the place.  Still felt very dizzy with standing up.  Physical Exam: Vitals:   08/22/23 2356 08/23/23 0500 08/23/23 0605 08/23/23 0730  BP:   (!) 139/97 136/80  Pulse:   (!) 107 (!) 112  Resp:   18 16  Temp: 98.3 F (36.8 C)  97.7 F (36.5 C) 98.3 F (36.8  C)  TempSrc:   Oral   SpO2:   97% 96%  Weight:  69.5 kg    Height:       Physical Exam HENT:     Head: Normocephalic.     Mouth/Throat:     Pharynx: No oropharyngeal exudate.  Eyes:     General: Lids are normal.     Conjunctiva/sclera: Conjunctivae  normal.  Cardiovascular:     Rate and Rhythm: Regular rhythm. Tachycardia present.     Heart sounds: Normal heart sounds, S1 normal and S2 normal.  Pulmonary:     Breath sounds: Examination of the right-lower field reveals decreased breath sounds. Examination of the left-lower field reveals decreased breath sounds. Decreased breath sounds present. No wheezing, rhonchi or rales.  Abdominal:     Palpations: Abdomen is soft.     Tenderness: There is abdominal tenderness in the right lower quadrant.  Musculoskeletal:     Right lower leg: No swelling.     Left lower leg: No swelling.  Skin:    General: Skin is warm.     Findings: No rash.  Neurological:     Mental Status: He is alert and oriented to person, place, and time.     Data Reviewed: Phosphorus 1.8, potassium 3.3, last hemoglobin 9.6  Family Communication: Briefly spoke with father on the phone  Disposition: Status is: Inpatient Remains inpatient appropriate because: Still having problems just with standing today.  Will get an MRI of the brain and check an a.m. cortisol.  Planned Discharge Destination: Hopefully home with home health but still having a lot of issues with even standing now.    Time spent: 28 minutes  Author: Alford Highland, MD 08/23/2023 3:42 PM  For on call review [email or web address].

## 2023-08-24 ENCOUNTER — Inpatient Hospital Stay: Payer: BC Managed Care – PPO

## 2023-08-24 DIAGNOSIS — A419 Sepsis, unspecified organism: Secondary | ICD-10-CM | POA: Diagnosis not present

## 2023-08-24 DIAGNOSIS — R000 Tachycardia, unspecified: Secondary | ICD-10-CM | POA: Insufficient documentation

## 2023-08-24 DIAGNOSIS — I951 Orthostatic hypotension: Secondary | ICD-10-CM | POA: Diagnosis not present

## 2023-08-24 DIAGNOSIS — J9601 Acute respiratory failure with hypoxia: Secondary | ICD-10-CM | POA: Diagnosis not present

## 2023-08-24 DIAGNOSIS — J189 Pneumonia, unspecified organism: Secondary | ICD-10-CM | POA: Diagnosis not present

## 2023-08-24 LAB — BASIC METABOLIC PANEL
Anion gap: 10 (ref 5–15)
BUN: 12 mg/dL (ref 8–23)
CO2: 26 mmol/L (ref 22–32)
Calcium: 8.3 mg/dL — ABNORMAL LOW (ref 8.9–10.3)
Chloride: 96 mmol/L — ABNORMAL LOW (ref 98–111)
Creatinine, Ser: 0.79 mg/dL (ref 0.61–1.24)
GFR, Estimated: >60 mL/min (ref >60)
Glucose, Bld: 93 mg/dL (ref 70–99)
Potassium: 3.5 mmol/L (ref 3.5–5.1)
Sodium: 132 mmol/L — ABNORMAL LOW (ref 135–145)

## 2023-08-24 LAB — CORTISOL-AM, BLOOD: Cortisol - AM: 20.3 ug/dL (ref 6.7–22.6)

## 2023-08-24 LAB — GLUCOSE, CAPILLARY: Glucose-Capillary: 92 mg/dL (ref 70–99)

## 2023-08-24 LAB — PHOSPHORUS: Phosphorus: 2.2 mg/dL — ABNORMAL LOW (ref 2.5–4.6)

## 2023-08-24 MED ORDER — POTASSIUM PHOSPHATES 15 MMOLE/5ML IV SOLN
30.0000 mmol | Freq: Once | INTRAVENOUS | Status: AC
  Administered 2023-08-24: 30 mmol via INTRAVENOUS
  Filled 2023-08-24: qty 10

## 2023-08-24 MED ORDER — IOHEXOL 350 MG/ML SOLN
75.0000 mL | Freq: Once | INTRAVENOUS | Status: AC | PRN
  Administered 2023-08-24: 75 mL via INTRAVENOUS

## 2023-08-24 MED ORDER — ALUM & MAG HYDROXIDE-SIMETH 200-200-20 MG/5ML PO SUSP
30.0000 mL | Freq: Once | ORAL | Status: AC
  Administered 2023-08-24: 30 mL via ORAL
  Filled 2023-08-24: qty 30

## 2023-08-24 NOTE — Plan of Care (Signed)
  Problem: Education: Goal: Knowledge of General Education information will improve Description: Including pain rating scale, medication(s)/side effects and non-pharmacologic comfort measures Outcome: Progressing   Problem: Activity: Goal: Risk for activity intolerance will decrease Outcome: Progressing   Problem: Coping: Goal: Level of anxiety will decrease Outcome: Progressing   

## 2023-08-24 NOTE — TOC Initial Note (Signed)
Transition of Care (TOC) - Initial/Assessment Note    Patient Details  Name: Patrick Bailey. MRN: [id number] Date of Birth: [birth year]  Transition of Care [facility]) CM/SW Contact:    Allena Katz, LCSW Phone Number: 08/24/2023, 3:36 PM  Clinical Narrative:     CSW met with patient at bedside. Pt reports he recently moved in with his father as he needed more assistance. Dad transports to appts. Pt active with dr Sharen Hones. Pt had hospice prior to admission but does not know the name of the agency and he now wants HH. Spoke to patient about possible copay he still wants to consider. No preference. He does want a rw and BSC also. CSW to order.              Expected Discharge Plan: Home w Home Health Services Barriers to Discharge: Continued Medical Work up   Patient Goals and CMS Choice     Choice offered to / list presented to : Patient      Expected Discharge Plan and Services                                              Prior Living Arrangements/Services   Lives with::  (Dad) Patient language and need for interpreter reviewed:: Yes Do you feel safe going back to the place where you live?: Yes      Need for Family Participation in Patient Care: Yes (Comment) Care giver support system in place?: Yes (comment)   Criminal Activity/Legal Involvement Pertinent to Current Situation/Hospitalization: Yes - Comment as needed  Activities of Daily Living   ADL Screening (condition at time of admission) Independently performs ADLs?: Yes (appropriate for developmental age) Is the patient deaf or have difficulty hearing?: No Does the patient have difficulty seeing, even when wearing glasses/contacts?: No Does the patient have difficulty concentrating, remembering, or making decisions?: No  Permission Sought/Granted         Permission granted to share info w AGENCY: HH Company and DME companies        Emotional Assessment       Orientation: : Oriented to  Self, Oriented to Place, Oriented to  Time, Oriented to Situation      Admission diagnosis:  Shock (HCC) [R57.9] History of cancer [Z85.9] Septic shock (HCC) [A41.9, R65.21] Pneumonia of right lung due to infectious organism, unspecified part of lung [J18.9] Acute cough [R05.1] Nausea and vomiting, unspecified vomiting type [R11.2] Patient Active Problem List   Diagnosis Date Noted   Orthostatic hypotension 08/21/2023   Protein-calorie malnutrition, severe 08/20/2023   Acute urinary retention 08/20/2023   Anemia of chronic disease 08/20/2023   Multifocal pneumonia 08/20/2023   Acute respiratory failure with hypoxia (HCC) 08/20/2023   Palliative care encounter 08/19/2023   Septic shock (HCC) 08/19/2023   Hyponatremia 07/05/2023   Abdominal pain 07/04/2023   COPD (chronic obstructive pulmonary disease) (HCC) 07/04/2023   HLD (hyperlipidemia) 07/04/2023   Chronic diastolic CHF (congestive heart failure) (HCC) 07/04/2023   Electrolyte abnormality 07/04/2023   Hypocalcemia 07/04/2023   Nausea vomiting and diarrhea 07/04/2023   Depression with anxiety 07/04/2023   Asthmatic bronchitis 03/17/2023   Chronic diarrhea 10/14/2022   Drug-induced skin desquamation 10/14/2022   Peritoneal carcinomatosis (HCC) 10/14/2022   Malnutrition of moderate degree 09/30/2022   Ileus (HCC) 09/28/2022   SIRS (systemic inflammatory response syndrome) (HCC) 09/28/2022  Lactic acidosis 09/28/2022   UTI (urinary tract infection) 09/28/2022   AKI (acute kidney injury) (HCC) 09/28/2022   Hypokalemia 09/28/2022   Hypomagnesemia 09/28/2022   History of alcohol abuse 09/28/2022   Internal carotid artery stenosis, right 08/10/2022   Acute ischemic right MCA stroke (HCC) 07/01/2022   Impacted cerumen, right ear 05/08/2022   Penile ulcer 03/04/2022   Immunocompromised (HCC) 03/04/2022   Cyst of lumbar facet joint 03/04/2022   Advanced directives, counseling/discussion 01/08/2022   GERD (gastroesophageal  reflux disease) 01/08/2022   Synovial cyst of lumbar facet joint 01/08/2022   Appendix carcinoma (HCC) 12/01/2021   Other spondylosis with radiculopathy, lumbar region 11/06/2021   Facet hypertrophy of lumbar region 11/06/2021   Closed fracture of distal end of radius 03/10/2021   Left leg numbness 07/20/2020   BPH (benign prostatic hyperplasia) 01/26/2020   Central retinal artery occlusion of right eye 01/17/2020   Carotid stenosis 01/14/2020   Diastolic dysfunction 01/14/2020   Herpes labialis 01/05/2020   Hearing loss secondary to cerumen impaction, right 01/05/2020   S/P total knee arthroplasty 01/31/2019   Primary osteoarthritis of right knee 01/10/2019   Peripheral neuropathy 03/13/2018   Scalp lesion 08/04/2016   History of cerebellar stroke 07/17/2016   Hypertriglyceridemia 06/17/2015   Hypothyroidism    Degenerative arthritis of knee, bilateral 10/19/2014   Pseudogout 10/19/2014   Leg pain 08/10/2014   Essential hypertension 08/13/2010   Alcohol use 02/05/2009   TOBACCO ABUSE, HX OF 02/05/2009   Alcohol abuse 02/05/2009   History of oral cancer 11/01/2008   Anxiety state 07/24/2008   Chronic insomnia 07/24/2008   Shoulder joint pain 12/20/2006   PCP:  Eustaquio Boyden, MD Pharmacy:   [facility] [address] [place] - [address] [place] [address] Phone: [phone] Fax: [phone]  [facility] [phone] - [place], [place] - [address] [place] [address] Phone: [phone] Fax: [phone]     Social Drivers of Health (SDOH) Social History: SDOH Screenings   Food Insecurity: No Food Insecurity (08/20/2023)  Housing: Low Risk  (08/20/2023)  Transportation Needs: No Transportation Needs (08/20/2023)  Utilities: Not At Risk (08/20/2023)  Depression (PHQ2-9): Medium Risk (01/06/2022)  Financial Resource Strain: Low Risk  (12/15/2021)   [facility] from [facility]  Tobacco Use: Medium  Risk (08/18/2023)  Health Literacy: Low Risk  (12/15/2021)   [facility] from [facility], [facility]   SDOH Interventions:     Readmission Risk Interventions    08/24/2023    3:35 PM 07/06/2023   10:49 AM  Readmission Risk Prevention Plan  Transportation Screening Complete Complete  Medication Review (RN Care Manager) Complete Complete  PCP or Specialist appointment within 3-5 days of discharge Complete Complete  HRI or Home Care Consult Complete Complete  SW Recovery Care/Counseling Consult Complete Complete  Palliative Care Screening Not Applicable Not Applicable  Skilled Nursing Facility Not Applicable Not Complete  SNF Comments  patient not agreeable too SNF

## 2023-08-24 NOTE — Progress Notes (Deleted)
   08/23/23 2329  Assess: MEWS Score  Temp 98 F (36.7 C)  BP (!) 115/96  MAP (mmHg) 103  Pulse Rate (!) 123  SpO2 92 %  O2 Device Room Air  Assess: MEWS Score  MEWS Temp 0  MEWS Systolic 0  MEWS Pulse 2  MEWS RR 0  MEWS LOC 0  MEWS Score 2  MEWS Score Color Yellow  Assess: if the MEWS score is Yellow or Red  Were vital signs accurate and taken at a resting state? Yes  Does the patient meet 2 or more of the SIRS criteria? No  MEWS guidelines implemented  Yes, yellow  Treat  MEWS Interventions Considered administering scheduled or prn medications/treatments as ordered  Take Vital Signs  Increase Vital Sign Frequency  Yellow: Q2hr x1, continue Q4hrs until patient remains green for 12hrs  Escalate  MEWS: Escalate Yellow: Discuss with charge nurse and consider notifying provider and/or RRT  Notify: Charge Nurse/RN  Name of Charge Nurse/RN Notified Lupita Leash, RN  Assess: SIRS CRITERIA  SIRS Temperature  0  SIRS Respirations  0  SIRS Pulse 1  SIRS WBC 0  SIRS Score Sum  1   HR might have been elevated b/c of pain and or nausea. Will continue to monitor and have day shift RN address with attending.

## 2023-08-24 NOTE — Assessment & Plan Note (Addendum)
Metoprolol held with orthostatic hypotension.  Will get CT scan of the chest to rule out PE.

## 2023-08-24 NOTE — Progress Notes (Signed)
   08/23/23 2329  Assess: MEWS Score  Temp 98 F (36.7 C)  BP (!) 115/96  MAP (mmHg) 103  Pulse Rate (!) 123  SpO2 92 %  O2 Device Room Air  Assess: MEWS Score  MEWS Temp 0  MEWS Systolic 0  MEWS Pulse 2  MEWS RR 0  MEWS LOC 0  MEWS Score 2  MEWS Score Color Yellow  Assess: if the MEWS score is Yellow or Red  Were vital signs accurate and taken at a resting state? Yes  Does the patient meet 2 or more of the SIRS criteria? No  MEWS guidelines implemented  Yes, yellow  Treat  MEWS Interventions Considered administering scheduled or prn medications/treatments as ordered  Take Vital Signs  Increase Vital Sign Frequency  Yellow: Q2hr x1, continue Q4hrs until patient remains green for 12hrs  Escalate  MEWS: Escalate Yellow: Discuss with charge nurse and consider notifying provider and/or RRT  Notify: Charge Nurse/RN  Name of Charge Nurse/RN Notified Lupita Leash, RN  Assess: SIRS CRITERIA  SIRS Temperature  0  SIRS Respirations  0  SIRS Pulse 1  SIRS WBC 0  SIRS Score Sum  1   Don't know if pt's HR is related to chronic pain or the persistent nausea he's experiencing.  Will send attending a secure chat in the am.  He's been tachycardic repeatedly during this admission.

## 2023-08-24 NOTE — Progress Notes (Signed)
Progress Note   Patient: Patrick Bailey. KGM:[id number] DOB: 10-24-1960 DOA: 08/18/2023     6 DOS: the patient was seen and examined on 08/24/2023   Brief hospital course: 63 year old man past medical history of hypertension, hyperlipidemia, right MCA stroke back in 2023, hypothyroidism, neuropathy, 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  Presented to the ER not feeling well with nasal congestion and night sweats.  Patient was found to be hypotensive and called and unable to obtain a pulse ox.  He was started on high flow nasal cannula and admitted with septic shock requiring pressors.  Patient started on antibiotics secondary to bilateral pneumonia.  1/17.  Transferred to medicine service.  Patient once the NG tube out.  Patient off pressors but blood pressure still on the lower side.  Will hold metoprolol.  Continue antibiotics for pneumonia.  Reviewed previous CT scan with general surgery and they believe that this is likely fluid collection from prior cancer rather than an abscess since it was seen on prior CT scan also.  Will transfer out of ICU. 1/18.  Patient felt dizzy with walking around today.  Did well with physical therapy yesterday.  Patient orthostatic today.  Will give a fluid bolus and started on low-dose midodrine.  Metoprolol discontinued yesterday.  Replacing phosphorus today. 1/19.  Still feeling dizzy today.  Still orthostatic.  Increasing midodrine to 10 mg 3 times daily.  Continue to replace phosphorus. 1/20.  Patient was having pain on urination but has a Foley catheter that looks like it is draining.  Asked nursing staff to flush.  Still having problems just with standing with physical therapist today.  Blood pressures and heart rates all over the place. 1/21.  Patient still having some pain but likely in the right lower quadrant.  Nursing staff flushed his Foley.  Patient states he is still symptomatic with standing up with dizziness and  lightheadedness.  Assessment and Plan: * Orthostatic hypotension Metoprolol discontinued 4 days ago.  Continue midodrine to 10 mg 3 times daily.  TED hose.  Advise continuing activity and working with physical therapy.  Hold Flomax.  A.m. cortisol normal range.  MRI of the brain did not show any acute abnormalities but did show prior subarachnoid hemorrhage.  I did not see any orthostatic vital signs today.  I will give them check every 4 hours while awake.  Will get CT scan of the chest to rule out PE.  Septic shock (HCC) Present on admission with acute hypoxic respiratory failure, lactic acidosis, acute kidney injury, bilateral pneumonia, leukocytosis, hypotension and tachycardia.  Completed Zithromax.  Switched Maxipime and Flagyl over to Augmentin to complete 7 days.  White blood cell count normal range.  Multifocal pneumonia Switched Maxipime and Flagyl over to Augmentin to complete 7 days.  Completed Zithromax already.  Acute respiratory failure with hypoxia [facility]) Patient required high flow nasal cannula during the hospital course for hypoxia (pulse ox 87% on 6 L on 1/15).  Now breathing on room air.  Would like to check pulse ox with ambulation.  Appendix carcinoma [facility]) Follow-up with your oncologist as outpatient.  Electrolyte abnormality Hypokalemia, hypophosphatemia, hypomagnesemia and hyponatremia.  Continue salt tablets for sodium of 132.  IV K-Phos for potassium and phosphorus being low.  AKI (acute kidney injury) (HCC) Today's creatinine down to 0.79.  Creatinine on presentation 2.52.  Acute urinary retention Hold Flomax for orthostatic hypotension.  Started Proscar and continue coud catheter.  Patient having pain.  Asked  nursing staff to flush Foley again.  Patient hesitant on taking out Foley currently.  Protein-calorie malnutrition, severe Continue supplements  Tachycardia Metoprolol held with orthostatic hypotension.  Will get CT scan of the chest to rule out  PE.  Anemia of chronic disease Last hemoglobin 9.6        Subjective: Patient states he still very symptomatic when standing up did not do too well yesterday with physical therapy.  Admitted 6 days ago with septic shock.  Physical Exam: Vitals:   08/23/23 2329 08/24/23 0434 08/24/23 0500 08/24/23 0953  BP: (!) 115/96 115/86  124/89  Pulse: (!) 123 62  (!) 115  Resp:    16  Temp: 98 F (36.7 C) 98.1 F (36.7 C)  98.2 F (36.8 C)  TempSrc: Oral Oral  Oral  SpO2: 92% 95%  96%  Weight:   70.2 kg   Height:       Physical Exam HENT:     Head: Normocephalic.     Mouth/Throat:     Pharynx: No oropharyngeal exudate.  Eyes:     General: Lids are normal.     Conjunctiva/sclera: Conjunctivae normal.  Cardiovascular:     Rate and Rhythm: Regular rhythm. Tachycardia present.     Heart sounds: Normal heart sounds, S1 normal and S2 normal.  Pulmonary:     Breath sounds: Examination of the right-lower field reveals decreased breath sounds. Examination of the left-lower field reveals decreased breath sounds. Decreased breath sounds present. No wheezing, rhonchi or rales.  Abdominal:     Palpations: Abdomen is soft.     Tenderness: There is abdominal tenderness in the right lower quadrant.  Musculoskeletal:     Right lower leg: No swelling.     Left lower leg: No swelling.  Skin:    General: Skin is warm.     Findings: No rash.  Neurological:     Mental Status: He is alert and oriented to person, place, and time.     Data Reviewed: Sodium 132, creatinine 0.79, phosphorus 2.2  Family Communication: Updated sister on the phone  Disposition: Status is: Inpatient Remains inpatient appropriate because: Continues to do better with physical therapy in order to go home.  Patient still symptomatic when standing up.  Planned Discharge Destination: Home with Home Health    Time spent: 28 minutes  Author: Alford Highland, MD 08/24/2023 4:53 PM  For on call review  [email or web address].

## 2023-08-25 DIAGNOSIS — I951 Orthostatic hypotension: Secondary | ICD-10-CM | POA: Diagnosis not present

## 2023-08-25 LAB — CBC
HCT: 31.8 % — ABNORMAL LOW (ref 39.0–52.0)
Hemoglobin: 10.2 g/dL — ABNORMAL LOW (ref 13.0–17.0)
MCH: 25.6 pg — ABNORMAL LOW (ref 26.0–34.0)
MCHC: 32.1 g/dL (ref 30.0–36.0)
MCV: 79.9 fL — ABNORMAL LOW (ref 80.0–100.0)
Platelets: 279 10*3/uL (ref 150–400)
RBC: 3.98 MIL/uL — ABNORMAL LOW (ref 4.22–5.81)
RDW: 17.3 % — ABNORMAL HIGH (ref 11.5–15.5)
WBC: 7.1 10*3/uL (ref 4.0–10.5)
nRBC: 0 % (ref 0.0–0.2)

## 2023-08-25 LAB — BASIC METABOLIC PANEL
Anion gap: 10 (ref 5–15)
BUN: 11 mg/dL (ref 8–23)
CO2: 28 mmol/L (ref 22–32)
Calcium: 8.4 mg/dL — ABNORMAL LOW (ref 8.9–10.3)
Chloride: 93 mmol/L — ABNORMAL LOW (ref 98–111)
Creatinine, Ser: 0.77 mg/dL (ref 0.61–1.24)
GFR, Estimated: >60 mL/min (ref >60)
Glucose, Bld: 92 mg/dL (ref 70–99)
Potassium: 3.8 mmol/L (ref 3.5–5.1)
Sodium: 131 mmol/L — ABNORMAL LOW (ref 135–145)

## 2023-08-25 LAB — PHOSPHORUS: Phosphorus: 2.2 mg/dL — ABNORMAL LOW (ref 2.5–4.6)

## 2023-08-25 LAB — MAGNESIUM: Magnesium: 1.5 mg/dL — ABNORMAL LOW (ref 1.7–2.4)

## 2023-08-25 MED ORDER — ALUM & MAG HYDROXIDE-SIMETH 200-200-20 MG/5ML PO SUSP
30.0000 mL | Freq: Four times a day (QID) | ORAL | Status: DC | PRN
  Administered 2023-08-25 – 2023-08-26 (×2): 30 mL via ORAL
  Filled 2023-08-25 (×2): qty 30

## 2023-08-25 MED ORDER — WHITE PETROLATUM EX OINT
TOPICAL_OINTMENT | CUTANEOUS | Status: DC | PRN
  Filled 2023-08-25: qty 5

## 2023-08-25 MED ORDER — JUVEN PO PACK
1.0000 | PACK | Freq: Two times a day (BID) | ORAL | Status: DC
  Administered 2023-08-25 – 2023-08-27 (×4): 1 via ORAL

## 2023-08-25 NOTE — Plan of Care (Signed)
  Problem: Education: Goal: Knowledge of General Education information will improve Description: Including pain rating scale, medication(s)/side effects and non-pharmacologic comfort measures Outcome: Progressing   Problem: Health Behavior/Discharge Planning: Goal: Ability to manage health-related needs will improve Outcome: Progressing   Problem: Elimination: Goal: Will not experience complications related to bowel motility Outcome: Progressing   Problem: Skin Integrity: Goal: Risk for impaired skin integrity will decrease Outcome: Progressing   Problem: Clinical Measurements: Goal: Respiratory complications will improve Outcome: Not Progressing   Problem: Nutrition: Goal: Adequate nutrition will be maintained Outcome: Not Progressing   Problem: Coping: Goal: Level of anxiety will decrease Outcome: Not Progressing   Problem: Elimination: Goal: Will not experience complications related to urinary retention Outcome: Not Progressing   Problem: Pain Managment: Goal: General experience of comfort will improve and/or be controlled Outcome: Not Progressing

## 2023-08-25 NOTE — Progress Notes (Signed)
Nutrition Follow-up  DOCUMENTATION CODES:   Severe malnutrition in context of chronic illness  INTERVENTION:   -Liberalize diet to regular for widest variety of meal selections -MVI with minerals daily -D/c Boost Breeze po TID, each supplement provides 250 kcal and 9 grams of protein  -1 packet Juven BID, each packet provides 95 calories, 2.5 grams of protein (collagen), and 9.8 grams of carbohydrate (3 grams sugar); also contains 7 grams of L-arginine and L-glutamine, 300 mg vitamin C, 15 mg vitamin E, 1.2 mcg vitamin B-12, 9.5 mg zinc, 200 mg calcium, and 1.5 g  Calcium Beta-hydroxy-Beta-methylbutyrate to support wound healing  -Continue Magic cup TID with meals, each supplement provides 290 kcal and 9 grams of protein   NUTRITION DIAGNOSIS:   Severe Malnutrition related to cancer and cancer related treatments as evidenced by severe fat depletion, severe muscle depletion, percent weight loss.  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 Pt is followed by home hospice.  1/16- advanced to clear liquid diet 1/17- NGT d/c, advanced to full liquid diet, advanced to soft diet  Reviewed  I/O's: -300 ml x 24 hours and -1.4 L x 24 hours  UOP: 300 ml x 24 hours  Pt unavailable at time of visit. Attempted to speak with pt via call to hospital room phone, however, unable to reach.    Staff checking orthostatic vital signs. Pt with poor progress with physical therapy and feels very symptomatic when standing up.   Pt currently on a soft diet. No meal completion data available to assess at this time. Pt is refusing Boost Breeze supplements. Pt reports he does not like milky supplements as they make him throw up.   Per TOC notes, plan to d/c home with home health services once medically stable.   Per palliative care notes, suspect pt is nearing end of life, however, pt desires full code status.   Medications reviewed and include colace, neurontin, zofran, protonix, miralax, and sodium chloride.   Labs reviewed: Na: 131, CBGS: 92 (inpatient orders for glycemic control are none).    Diet Order:   Diet Order             DIET SOFT Room service appropriate? Yes; Fluid consistency: Thin  Diet effective now                   EDUCATION NEEDS:   Education needs have been addressed  Skin:  Skin Assessment: Reviewed RN Assessment  Last BM:  1/16- type 7  Height:   Ht Readings from Last 1 Encounters:  08/20/23 6' (1.829 m)    Weight:   Wt Readings from Last 1 Encounters:  08/24/23 70.2 kg    Ideal Body Weight:  78 kg  BMI:  Body mass index is 20.99 kg/m.  Estimated Nutritional Needs:   Kcal:  2100-2400kcal/day  Protein:  105-120g/day  Fluid:  1.8-2.1L/day    Levada Schilling, RD, LDN, CDCES Registered Dietitian III Certified Diabetes Care and Education Specialist If unable to reach this RD, please use "RD Inpatient" group chat on secure chat between hours of 8am-4 pm daily

## 2023-08-25 NOTE — Progress Notes (Signed)
Occupational Therapy Treatment Patient Details Name: Patrick Bailey. MRN: [id number] DOB: 1961/04/06 Today's Date: 08/25/2023   History of present illness Pt is a 63 y.o. male presenting to hospital 08/18/23 with c/o abdominal pain, emesis, nausea, SOB, and weakness.  Pt admitted with septic shock secondary to CAP and possible intraabdominal source.  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   OT comments  Pt making progress towards goals. TED hose donned bed level prior to mobility. Supervision for bed mobility + transfers, and walks in hallway ~70 ft with CGA +2 for chair follow for safety. Returns to recliner to perform oral care and grooming tasks with setup. Pt will benefit from continued OT services to address deficits in balance, activity tolerance and strength. Discharge recommendation appropriate.       If plan is discharge home, recommend the following:  A little help with walking and/or transfers;A little help with bathing/dressing/bathroom;Assist for transportation;Assistance with cooking/housework   Equipment Recommendations  BSC/3in1       Precautions / Restrictions Precautions Precautions: Fall Precaution Comments: R chest port Restrictions Weight Bearing Restrictions Per Provider Order: No       Mobility Bed Mobility Overal bed mobility: Needs Assistance Bed Mobility: Supine to Sit     Supine to sit: Supervision, HOB elevated          Transfers Overall transfer level: Needs assistance Equipment used: Rolling walker (2 wheels) Transfers: Sit to/from Stand Sit to Stand: Supervision                 Balance Overall balance assessment: Needs assistance Sitting-balance support: No upper extremity supported, Feet supported Sitting balance-Leahy Scale: Good Sitting balance - Comments: no LOB or unsteadiness while seated in recliner for ADLs   Standing  balance support: Bilateral upper extremity supported Standing balance-Leahy Scale: Poor                             ADL either performed or assessed with clinical judgement   ADL Overall ADL's : Needs assistance/impaired     Grooming: Wash/dry face;Wash/dry hands;Oral care;Set up;Sitting Grooming Details (indicate cue type and reason): in recliner end of session             Lower Body Dressing: Maximal assistance;Bed level Lower Body Dressing Details (indicate cue type and reason): TED hose donned, socks donned bed level prior to mobility             Functional mobility during ADLs: Supervision/safety;Rolling walker (2 wheels) General ADL Comments: Orthostatic BP taken throughout, mild dizziness but improved overall (see flowsheet). Functional mobility with RW, supervision/CGA with +2 chair follow.    Extremity/Trunk Assessment               Cognition Arousal: Alert Behavior During Therapy: WFL for tasks assessed/performed Overall Cognitive Status: Within Functional Limits for tasks assessed                                                     Pertinent Vitals/ Pain       Pain Assessment Pain Assessment: Faces Faces Pain Scale: Hurts whole lot Pain Location: abdominal pain, genital pain Pain Descriptors / Indicators: Aching, Sore, Sharp Pain Intervention(s): Limited activity within  patient's tolerance, Monitored during session, Premedicated before session   Frequency  Min 1X/week        Progress Toward Goals  OT Goals(current goals can now be found in the care plan section)  Progress towards OT goals: Progressing toward goals  Acute Rehab OT Goals OT Goal Formulation: With patient Time For Goal Achievement: 09/04/23 Potential to Achieve Goals: Good ADL Goals Pt Will Perform Grooming: with set-up;standing Pt Will Transfer to Toilet: with supervision;ambulating;grab bars Additional ADL Goal #1: Pt will be indep to  verbalize and demo 1-2 EC strategies to improve tolerance to ADLs and functional mobility.  Plan      Co-evaluation    PT/OT/SLP Co-Evaluation/Treatment: Yes Reason for Co-Treatment: To address functional/ADL transfers PT goals addressed during session: Mobility/safety with mobility;Balance;Proper use of DME OT goals addressed during session: ADL's and self-care      AM-PAC OT "6 Clicks" Daily Activity     Outcome Measure   Help from another person eating meals?: None Help from another person taking care of personal grooming?: A Little Help from another person toileting, which includes using toliet, bedpan, or urinal?: A Little Help from another person bathing (including washing, rinsing, drying)?: A Little Help from another person to put on and taking off regular upper body clothing?: None Help from another person to put on and taking off regular lower body clothing?: A Little 6 Click Score: 20    End of Session Equipment Utilized During Treatment: Rolling walker (2 wheels)  OT Visit Diagnosis: Unsteadiness on feet (R26.81);Muscle weakness (generalized) (M62.81)   Activity Tolerance Patient tolerated treatment well   Patient Left in chair;with call bell/phone within reach   Nurse Communication Other (comment);Mobility status        Time: 7829-5621 OT Time Calculation (min): 21 min  Charges: OT General Charges $OT Visit: 1 Visit OT Treatments $Self Care/Home Management : 8-22 mins  Sarahgrace Broman L. Trice Aspinall, OTR/L  08/25/23, 2:44 PM

## 2023-08-25 NOTE — Progress Notes (Signed)
PROGRESS NOTE    Patrick Bailey.   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  Presented to the ER not feeling well with nasal congestion and night sweats.  On EMS arrival at home he was found to be hypotensive cold and unable to obtain SpO2 so he was placed on nonrebreather.  01/15: admitted to ICU with septic shock due to pneumonia, requiring pressors. NG in place. CT chest multifocal opacities, CT abdomen pelvis with distended stomach and fluid-filled esophagus likely gastroparesis in the setting of being chronically on opioids. 01/16: off pressors, BP still low.  01/17: Transferred to medicine service. Patient off pressors but blood pressure still on the lower side. Continue antibiotics for pneumonia.  Reviewed previous CT scan with general surgery and they believe that this is likely fluid collection from prior cancer rather than an abscess since it was seen on prior CT scan also.  01/18.  Patient felt dizzy with walking around today.  Did well with physical therapy yesterday, orthostatic today.  Gave a fluid bolus and started on low-dose midodrine.  Metoprolol discontinued yesterday.   01/19.  Still dizzy/orthostatic. Increasing midodrine to 10 mg 3 times daily.  01/20.  pain on urination but has a Foley catheter that looks like it is draining.  Asked nursing staff to flush.  Still having problems just with standing with physical therapist today.  Blood pressures and heart rates labile 01/21.  Patient still having some pain right lower quadrant.  Nursing staff flushed his Foley.  Patient states he is still symptomatic with standing up with dizziness and  lightheadedness. CTA chest, no PE, also showed upper abdominal findings consistent with metastatic disease and peritoneal carcinomatosis. New free fluid within the left upper quadrant since recent abdominal CT. 01/22: void trial today. Pt c/o pain at urethral meatus, Foley removed      Consultants:  ICU Palliative Care / Oncology   Procedures/Surgeries: non      ASSESSMENT & PLAN:   Orthostatic hypotension Metoprolol discontinued 5 days ago.   A.m. cortisol normal range.   MRI of the brain did not show any acute abnormalities but did show prior subarachnoid hemorrhage.  Continue midodrine to 10 mg 3 times daily.   TED hose.   Advise continuing activity and working with physical therapy.   Hold Flomax. Will get CT scan of the chest to rule out PE.   Septic shock - resolved  Due to CAP / Multifocal pneumonia Present on admission with acute hypoxic respiratory failure, lactic acidosis, acute kidney injury, bilateral pneumonia, leukocytosis, hypotension and tachycardia.   Completed Zithromax.   Switched Maxipime and Flagyl over to Augmentin to complete 7 days.   Monitor sepsis paramters    Acute respiratory failure with hypoxia - resolved  Patient required high flow nasal cannula during the hospital course for hypoxia (pulse ox 87% on 6 L on 1/15).  Now breathing on room air.   check pulse ox with ambulation.   Appendix carcinoma  CT showed upper abdominal findings consistent with metastatic disease and peritoneal carcinomatosis.  Follow-up with oncologist as outpatient.   Electrolyte abnormality Hypokalemia, hypophosphatemia, hypomagnesemia and hyponatremia.   Continue salt tablets for sodium of  132.   IV K-Phos for potassium and phosphorus being low.   AKI (acute kidney injury) - resolved Creatinine on presentation 2.52. Monitor BMP   Acute urinary retention Hold Flomax for orthostatic hypotension.   Started Proscar  continue coud catheter.   Void trial, if  pain persistent / need to replace Foley will d/w urology    Protein-calorie malnutrition, severe Continue supplements   Tachycardia Metoprolol held with orthostatic hypotension.    Anemia of chronic disease Last hemoglobin 9.6 Monitor CBC           DVT prophylaxis: heparin IV fluids: no continuous IV fluids  Nutrition: regular Central lines / invasive devices: FOley --> removal ordered today   Code Status: FULL CODE ACP documentation reviewed:  none on file in [facility]  TOC needs: home health Barriers to dispo / significant pending items: void trial              Subjective / Brief ROS:  Patient reports pain / burning / sharp pain at urethra Discussed discharge readiness, pt states "I can't go home like this" and when asked to elaborate he notes pain in urethra, mild headache  Denies CP/SOB.  Reprots heartburn  Denies new weakness.  Tolerating diet.   Family Communication: none at this time     Objective Findings:  Vitals:   08/24/23 1730 08/24/23 2144 08/25/23 0458 08/25/23 0921  BP: (!) 135/91 (!) 128/94 123/87 122/74  Pulse: (!) 109 95 63 66  Resp: 18   19  Temp: (!) 97.4 F (36.3 C) 98.2 F (36.8 C) 98 F (36.7 C) 98.3 F (36.8 C)  TempSrc:      SpO2: 99% 95% 95% 95%  Weight:      Height:        Intake/Output Summary (Last 24 hours) at 08/25/2023 1657 Last data filed at 08/25/2023 1409 Gross per 24 hour  Intake 419.8 ml  Output 300 ml  Net 119.8 ml   Filed Weights   08/21/23 0500 08/23/23 0500 08/24/23 0500  Weight: 72.3 kg 69.5 kg 70.2 kg    Examination:  Physical Exam Constitutional:      General: He is not in acute distress.    Appearance: He is ill-appearing.  Cardiovascular:     Rate and Rhythm: Normal rate and regular rhythm.  Pulmonary:     Effort: Pulmonary effort is normal.     Breath sounds: Normal breath sounds.  Abdominal:     Palpations: Abdomen is soft.     Tenderness: There is no abdominal tenderness.   Genitourinary:    Comments: Mild irritation and very small excoriated area at urethral meatus, no blistering/vesicles, no erythema   Skin:    General: Skin is warm and dry.  Neurological:     General: No focal deficit present.     Mental Status: He is alert.  Psychiatric:        Mood and Affect: Mood is depressed.        Behavior: Behavior normal.          Scheduled Medications:   calcium carbonate  1 tablet Oral TID   Chlorhexidine Gluconate Cloth  6 each Topical Daily   docusate sodium  100 mg Oral BID   finasteride  5 mg Oral QPM   gabapentin  300 mg Oral TID   heparin injection (subcutaneous)  5,000 Units Subcutaneous Q8H   midodrine  10 mg Oral TID WC   multivitamin with minerals  1 tablet Oral Daily  nutrition supplement (JUVEN)  1 packet Oral BID BM   ondansetron (ZOFRAN) IV  4 mg Intravenous Q6H   oxyCODONE  10 mg Oral QID   pantoprazole  40 mg Oral BID   polyethylene glycol  17 g Oral Daily   sodium chloride  1 g Oral BID WC    Continuous Infusions:   PRN Medications:  alum & mag hydroxide-simeth, HYDROmorphone, polyethylene glycol, white petrolatum  Antimicrobials from admission:  Anti-infectives (From admission, onward)    Start     Dose/Rate Route Frequency Ordered Stop   08/23/23 2200  amoxicillin-clavulanate (AUGMENTIN) 875-125 MG per tablet 1 tablet        1 tablet Oral Every 12 hours 08/23/23 1324 08/24/23 2156   08/20/23 0900  ceFEPIme (MAXIPIME) 2 g in sodium chloride 0.9 % 100 mL IVPB  Status:  Discontinued        2 g 200 mL/hr over 30 Minutes Intravenous Every 8 hours 08/20/23 0744 08/23/23 1324   08/19/23 0000  ceFEPIme (MAXIPIME) 2 g in sodium chloride 0.9 % 100 mL IVPB  Status:  Discontinued        2 g 200 mL/hr over 30 Minutes Intravenous Every 12 hours 08/18/23 1755 08/20/23 0744   08/18/23 2000  vancomycin (VANCOREADY) IVPB 500 mg/100 mL        500 mg 100 mL/hr over 60 Minutes Intravenous  Once 08/18/23 1754 08/19/23 0701    08/18/23 1900  metroNIDAZOLE (FLAGYL) IVPB 500 mg  Status:  Discontinued        500 mg 100 mL/hr over 60 Minutes Intravenous Every 12 hours 08/18/23 1808 08/23/23 1324   08/18/23 1800  azithromycin (ZITHROMAX) 500 mg in sodium chloride 0.9 % 250 mL IVPB        500 mg 250 mL/hr over 60 Minutes Intravenous Every 24 hours 08/18/23 1747 08/21/23 0057   08/18/23 1755  vancomycin variable dose per unstable renal function (pharmacist dosing)  Status:  Discontinued         Does not apply See admin instructions 08/18/23 1755 08/19/23 0943   08/18/23 1245  ceFEPIme (MAXIPIME) 2 g in sodium chloride 0.9 % 100 mL IVPB        2 g 200 mL/hr over 30 Minutes Intravenous  Once 08/18/23 1239 08/18/23 1456   08/18/23 1245  metroNIDAZOLE (FLAGYL) IVPB 500 mg        500 mg 100 mL/hr over 60 Minutes Intravenous  Once 08/18/23 1239 08/18/23 1456   08/18/23 1245  vancomycin (VANCOCIN) IVPB 1000 mg/200 mL premix        1,000 mg 200 mL/hr over 60 Minutes Intravenous  Once 08/18/23 1239 08/18/23 1412           Data Reviewed:  I have personally reviewed the following...  CBC: Recent Labs  Lab 08/19/23 0553 08/20/23 0517 08/21/23 0922 08/22/23 0503 08/23/23 0501 08/25/23 0535  WBC 14.1* 10.8* 9.4 5.9  --  7.1  HGB 9.9* 9.6* 10.0* 9.0* 9.6* 10.2*  HCT 30.1* 29.6* 30.5* 26.8*  --  31.8*  MCV 78.8* 79.8* 79.6* 77.9*  --  79.9*  PLT 213 189 196 184  --  279   Basic Metabolic Panel: Recent Labs  Lab 08/20/23 0517 08/21/23 0922 08/22/23 0503 08/23/23 0501 08/23/23 0502 08/24/23 0511 08/25/23 0535  NA 128* 126* 129*  --  132* 132* 131*  K 3.1* 3.6 3.6  --  3.3* 3.5 3.8  CL 91* 90* 94*  --  98 96* 93*  CO2  25 26 24   --  24 26 28   GLUCOSE 114* 91 79  --  87 93 92  BUN 52* 28* 20  --  15 12 11   CREATININE 1.19 0.95 0.77  --  0.78 0.79 0.77  CALCIUM 8.2* 8.4* 8.1*  --  8.1* 8.3* 8.4*  MG 2.1 1.7 1.8 1.8  --   --  1.5*  PHOS 2.1* 1.5* 1.7*  --  1.8* 2.2* 2.2*   GFR: Estimated Creatinine  Clearance: 93.8 mL/min (by C-G formula based on SCr of 0.77 mg/dL). Liver Function Tests: Recent Labs  Lab 08/20/23 0517 08/21/23 0922 08/22/23 0503 08/23/23 0502  ALBUMIN 2.5* 2.5* 2.4* 2.2*   No results for input(s): "LIPASE", "AMYLASE" in the last 168 hours. No results for input(s): "AMMONIA" in the last 168 hours. Coagulation Profile: No results for input(s): "INR", "PROTIME" in the last 168 hours.  Cardiac Enzymes: No results for input(s): "CKTOTAL", "CKMB", "CKMBINDEX", "TROPONINI" in the last 168 hours. BNP (last 3 results) No results for input(s): "PROBNP" in the last 8760 hours. HbA1C: No results for input(s): "HGBA1C" in the last 72 hours. CBG: Recent Labs  Lab 08/21/23 1145 08/21/23 1545 08/22/23 0454 08/22/23 1608 08/24/23 1727  GLUCAP 99 83 85 72 92   Lipid Profile: No results for input(s): "CHOL", "HDL", "LDLCALC", "TRIG", "CHOLHDL", "LDLDIRECT" in the last 72 hours. Thyroid Function Tests: No results for input(s): "TSH", "T4TOTAL", "FREET4", "T3FREE", "THYROIDAB" in the last 72 hours. Anemia Panel: No results for input(s): "VITAMINB12", "FOLATE", "FERRITIN", "TIBC", "IRON", "RETICCTPCT" in the last 72 hours. Most Recent Urinalysis On File:     Component Value Date/Time   COLORURINE AMBER (A) 08/18/2023 1910   APPEARANCEUR HAZY (A) 08/18/2023 1910   LABSPEC 1.020 08/18/2023 1910   PHURINE 5.0 08/18/2023 1910   GLUCOSEU NEGATIVE 08/18/2023 1910   HGBUR SMALL (A) 08/18/2023 1910   BILIRUBINUR NEGATIVE 08/18/2023 1910   BILIRUBINUR negative 04/07/2019 1643   KETONESUR NEGATIVE 08/18/2023 1910   PROTEINUR NEGATIVE 08/18/2023 1910   UROBILINOGEN 0.2 04/07/2019 1643   NITRITE NEGATIVE 08/18/2023 1910   LEUKOCYTESUR NEGATIVE 08/18/2023 1910   Sepsis Labs: @LABRCNTIP (procalcitonin:4,lacticidven:4) Microbiology: Recent Results (from the past 240 hours)  Blood Culture (routine x 2)     Status: None   Collection Time: 08/18/23 12:35 PM   Specimen: BLOOD   Result Value Ref Range Status   Specimen Description   Final    BLOOD RIGHT AC Performed at [facility], [address] [place], [place] [address]    Special Requests   Final    BOTTLES DRAWN AEROBIC AND ANAEROBIC Blood Culture results may not be optimal due to an inadequate volume of blood received in culture bottles Performed at [facility], [address] [place], [place] [address]    Culture   Final    NO GROWTH 5 DAYS Performed at [facility], [address] [place], [place] [address]    Report Status 08/23/2023 FINAL  Final  Resp panel by RT-PCR (RSV, Flu A&B, Covid) Anterior Nasal Swab     Status: None   Collection Time: 08/18/23  1:13 PM   Specimen: Anterior Nasal Swab  Result Value Ref Range Status   SARS Coronavirus 2 by RT PCR NEGATIVE NEGATIVE Final    Comment: (NOTE) SARS-CoV-2 target nucleic acids are NOT DETECTED.  The SARS-CoV-2 RNA is generally detectable in upper respiratory specimens during the acute phase of infection. The lowest concentration of SARS-CoV-2 viral copies this assay can detect  is 138 copies/mL. A negative result does not preclude SARS-Cov-2 infection and should not be used as the sole basis for treatment or other patient management decisions. A negative result may occur with  improper specimen collection/handling, submission of specimen other than nasopharyngeal swab, presence of viral mutation(s) within the areas targeted by this assay, and inadequate number of viral copies(<138 copies/mL). A negative result must be combined with clinical observations, patient history, and epidemiological information. The expected result is Negative.  Fact Sheet for Patients:  BloggerCourse.com  Fact Sheet for Healthcare Providers:  SeriousBroker.it  This test is no t yet approved or cleared by the [place] FDA and  has been authorized for detection and/or diagnosis of  SARS-CoV-2 by FDA under an Emergency Use Authorization (EUA). This EUA will remain  in effect (meaning this test can be used) for the duration of the COVID-19 declaration under Section 564(b)(1) of the Act, 21 U.S.C.section 360bbb-3(b)(1), unless the authorization is terminated  or revoked sooner.       Influenza A by PCR NEGATIVE NEGATIVE Final   Influenza B by PCR NEGATIVE NEGATIVE Final    Comment: (NOTE) The Xpert Xpress SARS-CoV-2/FLU/RSV plus assay is intended as an aid in the diagnosis of influenza from Nasopharyngeal swab specimens and should not be used as a sole basis for treatment. Nasal washings and aspirates are unacceptable for Xpert Xpress SARS-CoV-2/FLU/RSV testing.  Fact Sheet for Patients: BloggerCourse.com  Fact Sheet for Healthcare Providers: SeriousBroker.it  This test is not yet approved or cleared by the [place] FDA and has been authorized for detection and/or diagnosis of SARS-CoV-2 by FDA under an Emergency Use Authorization (EUA). This EUA will remain in effect (meaning this test can be used) for the duration of the COVID-19 declaration under Section 564(b)(1) of the Act, 21 U.S.C. section 360bbb-3(b)(1), unless the authorization is terminated or revoked.     Resp Syncytial Virus by PCR NEGATIVE NEGATIVE Final    Comment: (NOTE) Fact Sheet for Patients: BloggerCourse.com  Fact Sheet for Healthcare Providers: SeriousBroker.it  This test is not yet approved or cleared by the [place] FDA and has been authorized for detection and/or diagnosis of SARS-CoV-2 by FDA under an Emergency Use Authorization (EUA). This EUA will remain in effect (meaning this test can be used) for the duration of the COVID-19 declaration under Section 564(b)(1) of the Act, 21 U.S.C. section 360bbb-3(b)(1), unless the authorization is terminated  or revoked.  Performed at [facility], [address] [place], [place] [address]   Blood Culture (routine x 2)     Status: None   Collection Time: 08/18/23  1:13 PM   Specimen: BLOOD  Result Value Ref Range Status   Specimen Description   Final    BLOOD BLOOD LEFT HAND Performed at [facility], [address], [place], [place] [address]    Special Requests   Final    BOTTLES DRAWN AEROBIC AND ANAEROBIC Blood Culture adequate volume Performed at [facility], [address] [place], [place] [address]    Culture   Final    NO GROWTH 5 DAYS Performed at [facility], [address] [place], [place] [address]    Report Status 08/23/2023 FINAL  Final  MRSA Next Gen by PCR, Nasal     Status: None   Collection Time: 08/18/23  5:57 PM   Specimen: Nasal Mucosa; Nasal Swab  Result Value Ref Range Status   MRSA by PCR Next Gen NOT DETECTED NOT DETECTED  Final    Comment: (NOTE) The GeneXpert MRSA Assay (FDA approved for NASAL specimens only), is one component of a comprehensive MRSA colonization surveillance program. It is not intended to diagnose MRSA infection nor to guide or monitor treatment for MRSA infections. Test performance is not FDA approved in patients less than 32 years old. Performed at [facility], [address], [place], [place] [address]       Radiology Studies last 3 days: CT Angio Chest Pulmonary Embolism (PE) W or WO Contrast Result Date: 08/24/2023 CLINICAL DATA:  Mid chest pain, short of breath, negative D-dimer, history of colon cancer and peritoneal carcinomatosis EXAM: CT ANGIOGRAPHY CHEST WITH CONTRAST TECHNIQUE: Multidetector CT imaging of the chest was performed using the standard protocol during bolus administration of intravenous contrast. Multiplanar CT image reconstructions and MIPs were obtained to evaluate the vascular anatomy. RADIATION DOSE REDUCTION: This exam was performed according to the  departmental dose-optimization program which includes automated exposure control, adjustment of the mA and/or kV according to patient size and/or use of iterative reconstruction technique. CONTRAST:  75mL OMNIPAQUE IOHEXOL 350 MG/ML SOLN COMPARISON:  11/21/2021, 08/18/2023 FINDINGS: Cardiovascular: This is a technically adequate evaluation of the pulmonary vasculature. No filling defects or pulmonary emboli. The heart is unremarkable without pericardial effusion. Aneurysm of the ascending thoracic aorta and aortic arch measuring up to 4 cm. Evaluation of the aortic lumen is limited due to timing of contrast bolus. There is atherosclerosis throughout the aorta and coronary vasculature. Right chest wall port via internal jugular approach tip within the superior vena cava. Mediastinum/Nodes: Fluid-filled esophagus likely due to reflux given the degree of gastric distension. Thyroid and trachea are unremarkable. Enlarged subcarinal lymph node measuring up to 13 mm, likely reactive. No other pathologic adenopathy. Lungs/Pleura: There is multifocal bilateral airspace disease, most pronounced within the lower lobes, not significantly changed since prior CT. There are small bilateral pleural effusions, right greater than left. No pneumothorax. Stable emphysema. Central airways are patent. Upper Abdomen: Moderate free fluid within the left upper quadrant. Soft tissue stranding within the omentum consistent with previous history of peritoneal carcinomatosis. 3 cm capsular hypodensity along the inferolateral margin of the right lobe liver image 145/4 is not appreciably changed since prior study, but has developed since 09/28/2022 exam, concerning for capsular metastatic implant. Musculoskeletal: No acute or destructive bony abnormalities. Reconstructed images demonstrate no additional findings. Review of the MIP images confirms the above findings. IMPRESSION: 1. No evidence of pulmonary embolus. 2. Bilateral airspace disease,  greatest within the lower lobes, consistent with multifocal pneumonia or widespread aspiration. 3. Small bilateral pleural effusions, right greater than left, new since recent abdominal CT. 4. Upper abdominal findings consistent with metastatic disease and peritoneal carcinomatosis. New free fluid within the left upper quadrant since recent abdominal CT. 5. 4 cm thoracic aortic aneurysm involving the ascending aorta and aortic arch. Recommend semi-annual imaging followup by CTA or MRA and referral to cardiothoracic surgery if not already obtained. This recommendation follows 2010 ACCF/AHA/AATS/ACR/ASA/SCA/SCAI/SIR/STS/SVM Guidelines for the Diagnosis and Management of Patients With Thoracic Aortic Disease. Circulation. 2010; 121: G401-U27. Aortic aneurysm NOS (ICD10-I71.9) 6. Aortic Atherosclerosis (ICD10-I70.0) and Emphysema (ICD10-J43.9). 7. Subcarinal adenopathy, likely reactive. Electronically Signed   By: Sharlet Salina M.D.   On: 08/24/2023 22:08   MR BRAIN WO CONTRAST Result Date: 08/24/2023 CLINICAL DATA:  Initial evaluation for acute neuro deficit, stroke suspected. EXAM: MRI HEAD WITHOUT CONTRAST TECHNIQUE: Multiplanar, multiecho pulse sequences of the brain and surrounding structures were obtained without intravenous  contrast. COMPARISON:  Prior brain MRI from 07/08/2023. FINDINGS: Brain: Cerebral volume within normal limits for age. Patchy T2/FLAIR hyperintensity involving the periventricular deep white matter both cerebral hemispheres, consistent with chronic small vessel ischemic disease, mild in nature. No evidence for acute or subacute ischemia. Gray-white matter differentiation maintained. No areas of chronic cortical infarction. No acute intracranial hemorrhage. Scattered areas of superficial siderosis noted about a few cortical sulci of the right cerebral hemisphere, most notably about the central sulcus (series 10, image 53). Findings consistent with prior subarachnoid hemorrhage at these  locations, stable from prior. Few additional punctate chronic micro hemorrhages noted. No mass lesion, midline shift or mass effect. No hydrocephalus or extra-axial fluid collection. Pituitary gland and suprasellar region within normal limits. Vascular: Loss of normal flow void within the hypoplastic right vertebral artery, which could be related to slow flow and/or occlusion, stable from prior. Major intracranial vascular flow voids are otherwise maintained. Skull and upper cervical spine: Craniocervical junction within normal limits. Bone marrow signal intensity normal. No scalp soft tissue abnormality. Sinuses/Orbits: Globes and orbital soft tissues within normal limits. Mild mucosal thickening noted about the ethmoidal air cells and maxillary sinuses. Small right mastoid effusion, similar to prior, and of doubtful significance. Negative nasopharynx. Other: None. IMPRESSION: 1. No acute intracranial abnormality. 2. Mild chronic microvascular ischemic disease for age. 3. Scattered areas of superficial siderosis about a few cortical sulci of the right cerebral hemisphere, consistent with prior subarachnoid hemorrhage, stable from prior. 4. Loss of normal flow void within the hypoplastic right vertebral artery, which could be related to slow flow and/or occlusion, also stable from prior. Electronically Signed   By: Rise Mu M.D.   On: 08/24/2023 04:42         Sunnie Nielsen, DO [facility] 08/25/2023, 4:57 PM    Dictation software may have been used to generate the above note. Typos may occur and escape review in typed/dictated notes. Please contact Dr Lyn Hollingshead directly for clarity if needed.  Staff may message me via secure chat in Epic  but this may not receive an immediate response,  please page me for urgent matters!  If 7PM-7AM, please contact night coverage [email or web address]

## 2023-08-25 NOTE — Progress Notes (Signed)
Physical Therapy Treatment Patient Details Name: Patrick Bailey. MRN: [id number] DOB: 04-28-1961 Today's Date: 08/25/2023   History of Present Illness Pt is a 63 y.o. male presenting to hospital 08/18/23 with c/o abdominal pain, emesis, nausea, SOB, and weakness.  Pt admitted with septic shock secondary to CAP and possible intraabdominal source.  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    PT Comments  Patient alert, agreeable to PT/OT co-treat despite elevated pain levels, RN in room to give pain medication. Orthostatic vitals assessed, negative for orthostatic hypotension but pt did still report dizziness (though less than last session). He was able to perform bed mobility and transfers with supervision, and ambulate ~48ft. Pt did fatigue quickly but able to complete with CGA. Returned to room with OT at bedside. The patient would benefit from further skilled PT intervention to continue to progress towards goals.    If plan is discharge home, recommend the following: A little help with walking and/or transfers;A little help with bathing/dressing/bathroom;Assistance with cooking/housework;Assist for transportation;Help with stairs or ramp for entrance   Can travel by private vehicle        Equipment Recommendations  Rolling walker (2 wheels);BSC/3in1    Recommendations for Other Services       Precautions / Restrictions Precautions Precautions: Fall Precaution Comments: R chest port Restrictions Weight Bearing Restrictions Per Provider Order: No     Mobility  Bed Mobility Overal bed mobility: Needs Assistance Bed Mobility: Supine to Sit     Supine to sit: Supervision, HOB elevated          Transfers Overall transfer level: Needs assistance Equipment used: Rolling walker (2 wheels) Transfers: Sit to/from Stand Sit to Stand: Supervision                 Ambulation/Gait Ambulation/Gait assistance: Contact guard assist Gait Distance (Feet): 70 Feet Assistive device: Rolling walker (2 wheels) Gait Pattern/deviations: Step-through pattern, Decreased step length - right, Decreased step length - left       General Gait Details: pt did fatigue but able to ambulate with CGA and RW   Stairs             Wheelchair Mobility     Tilt Bed    Modified Rankin (Stroke Patients Only)       Balance Overall balance assessment: Needs assistance Sitting-balance support: No upper extremity supported, Feet supported Sitting balance-Leahy Scale: Good     Standing balance support: Bilateral upper extremity supported Standing balance-Leahy Scale: Poor                              Cognition Arousal: Alert Behavior During Therapy: WFL for tasks assessed/performed Overall Cognitive Status: Within Functional Limits for tasks assessed                                          Exercises      General Comments        Pertinent Vitals/Pain Pain Assessment Pain Assessment: Faces Faces Pain Scale: Hurts whole lot Pain Location: abdominal pain, genital pain Pain Descriptors / Indicators: Aching, Sore, Sharp Pain Intervention(s): Limited activity within patient's tolerance, Monitored during session, Repositioned, RN gave pain meds during session    Home Living  Prior Function            PT Goals (current goals can now be found in the care plan section) Progress towards PT goals: Progressing toward goals    Frequency    Min 1X/week      PT Plan      Co-evaluation PT/OT/SLP Co-Evaluation/Treatment: Yes Reason for Co-Treatment: To address functional/ADL transfers PT goals addressed during session: Mobility/safety with mobility;Balance;Proper use of DME OT goals addressed during session: ADL's and self-care      AM-PAC PT "6 Clicks" Mobility   Outcome  Measure  Help needed turning from your back to your side while in a flat bed without using bedrails?: None Help needed moving from lying on your back to sitting on the side of a flat bed without using bedrails?: None Help needed moving to and from a bed to a chair (including a wheelchair)?: A Little Help needed standing up from a chair using your arms (e.g., wheelchair or bedside chair)?: A Little Help needed to walk in hospital room?: A Little Help needed climbing 3-5 steps with a railing? : A Lot 6 Click Score: 19    End of Session   Activity Tolerance: Patient limited by fatigue Patient left: in bed;with call bell/phone within reach;with bed alarm set Nurse Communication: Mobility status;Other (comment);Patient requests pain meds (pt HR during mobility) PT Visit Diagnosis: Other abnormalities of gait and mobility (R26.89);Muscle weakness (generalized) (M62.81);History of falling (Z91.81);Pain Pain - part of body:  (abdomen)     Time: 1610-9604 PT Time Calculation (min) (ACUTE ONLY): 21 min  Charges:    $Therapeutic Activity: 8-22 mins PT General Charges $$ ACUTE PT VISIT: 1 Visit                     Olga Coaster PT, DPT 1:11 PM,08/25/23

## 2023-08-26 DIAGNOSIS — I951 Orthostatic hypotension: Secondary | ICD-10-CM | POA: Diagnosis not present

## 2023-08-26 MED ORDER — OXYCODONE HCL 5 MG PO TABS
20.0000 mg | ORAL_TABLET | Freq: Four times a day (QID) | ORAL | Status: DC
  Administered 2023-08-26 – 2023-08-27 (×6): 20 mg via ORAL
  Filled 2023-08-26 (×6): qty 4

## 2023-08-26 MED ORDER — HYDROMORPHONE HCL 2 MG PO TABS: 2.0000 mg | ORAL_TABLET | ORAL | Status: DC | PRN

## 2023-08-26 MED ORDER — ENOXAPARIN SODIUM 40 MG/0.4ML IJ SOSY
40.0000 mg | PREFILLED_SYRINGE | INTRAMUSCULAR | Status: DC
Start: 2023-08-26 — End: 2023-08-27
  Administered 2023-08-26 – 2023-08-27 (×2): 40 mg via SUBCUTANEOUS
  Filled 2023-08-26 (×2): qty 0.4

## 2023-08-26 NOTE — Progress Notes (Signed)
PROGRESS NOTE    Patrick Bailey.   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  Presented to the ER not feeling well with nasal congestion and night sweats.  On EMS arrival at home he was found to be hypotensive cold and unable to obtain SpO2 so he was placed on nonrebreather.  01/15: admitted to ICU with septic shock due to pneumonia, requiring pressors. NG in place. CT chest multifocal opacities, CT abdomen pelvis with distended stomach and fluid-filled esophagus likely gastroparesis in the setting of being chronically on opioids. 01/16: off pressors, BP still low.  01/17: Transferred to medicine service. Patient off pressors but blood pressure still on the lower side. Continue antibiotics for pneumonia.  Reviewed previous CT scan with general surgery and they believe that this is likely fluid collection from prior cancer rather than an abscess since it was seen on prior CT scan also.  01/18.  Patient felt dizzy with walking around today.  Did well with physical therapy yesterday, orthostatic today.  Gave a fluid bolus and started on low-dose midodrine.  Metoprolol discontinued yesterday.   01/19.  Still dizzy/orthostatic. Increasing midodrine to 10 mg 3 times daily.  01/20.  pain on urination but has a Foley catheter that looks like it is draining.  Asked nursing staff to flush.  Still having problems just with standing with physical therapist today.  Blood pressures and heart rates labile 01/21.  Patient still having some pain right lower quadrant.  Nursing staff flushed his Foley.  Patient states he is still symptomatic with standing up with dizziness and  lightheadedness. CTA chest, no PE, also showed upper abdominal findings consistent with metastatic disease and peritoneal carcinomatosis. New free fluid within the left upper quadrant since recent abdominal CT. 01/22: void trial today. Pt c/o pain at urethral meatus, Foley removed  01/23: minimal UOP and minimal urine on bladder scan, hydrate and monitor I&O. Pain is improved today      Consultants:  ICU Palliative Care / Oncology   Procedures/Surgeries: non      ASSESSMENT & PLAN:   Orthostatic hypotension Metoprolol discontinued 5 days ago.   A.m. cortisol normal range.   MRI of the brain did not show any acute abnormalities but did show prior subarachnoid hemorrhage.  Continue midodrine to 10 mg 3 times daily.   TED hose.   Advise continuing activity and working with physical therapy.   Hold Flomax. Will get CT scan of the chest to rule out PE.   Septic shock - resolved  Due to CAP / Multifocal pneumonia Present on admission with acute hypoxic respiratory failure, lactic acidosis, acute kidney injury, bilateral pneumonia, leukocytosis, hypotension and tachycardia.   Completed Zithromax.   Switched Maxipime and Flagyl over to Augmentin to complete 7 days.   Monitor sepsis paramters    Acute respiratory failure with hypoxia - resolved  Patient required high flow nasal cannula during the hospital course for hypoxia (pulse ox 87% on 6 L on 1/15).  Now breathing on room air.   check pulse ox with ambulation.   Appendix carcinoma  CT showed upper abdominal findings consistent with metastatic disease and peritoneal carcinomatosis.  Follow-up with oncologist as  outpatient.   Chronic pain  PDMP reviewed: home meds recent Rx filled and adjustments as follows 01/01 Gabapentin 300 mg tid (#42 x14d) - continued here  01/01 Oxycodone IR 20 mg qid (#56 x14d) - has been on 10 mg qid here --> will increase to 20 mg qid scheduled  12/24 Hydromorphone 4 mg #30 x5d - will hold on  hydromorphone here unless breakthrough and then can have 2 mg po   Electrolyte abnormality Hypokalemia, hypophosphatemia, hypomagnesemia and hyponatremia.   Continue salt tablets for sodium of 132.   IV K-Phos for potassium and phosphorus being low.   AKI (acute kidney injury) - resolved Creatinine on presentation 2.52. Monitor BMP   Acute urinary retention Hold Flomax for orthostatic hypotension.   Started Proscar  D/c coud catheter yesterday Void trial today - not meeting goal, may need to replace Foley   Protein-calorie malnutrition, severe Continue supplements   Tachycardia Metoprolol held with orthostatic hypotension.    Anemia of chronic disease Last hemoglobin 9.6 Monitor CBC           DVT prophylaxis: heparin IV fluids: no continuous IV fluids  Nutrition: regular Central lines / invasive devices: port  Code Status: FULL CODE ACP documentation reviewed:  none on file in [facility]  TOC needs: home health Barriers to dispo / significant pending items: void trial today hydrate, if otherwise stable can d/c tomorrow w/ or w/o Foley              Subjective / Brief ROS:  Patient reports pain / burning / sharp pain at urethra is better  Pain overall better since we went back up on his pain med dose to what he is taking outside the hospital  Denies CP/SOB.  Reprots heartburn  Denies new weakness.  Tolerating diet.   Family Communication: none at this time     Objective Findings:  Vitals:   08/25/23 1721 08/25/23 1942 08/26/23 0409 08/26/23 0901  BP: (!) 133/98 107/81 (!) 122/90 (!) 121/91  Pulse: (!) 114 60 (!) 110   Resp: 18 18 19 15   Temp:  98.7 F (37.1 C) 98 F (36.7 C) 97.7 F (36.5 C)  TempSrc:  Oral    SpO2: 96% 99% 94% 91%  Weight:      Height:        Intake/Output Summary (Last 24 hours) at 08/26/2023 1619 Last data filed at 08/26/2023 1048 Gross per 24 hour  Intake 240 ml  Output --  Net 240 ml   Filed Weights   08/21/23  0500 08/23/23 0500 08/24/23 0500  Weight: 72.3 kg 69.5 kg 70.2 kg    Examination:  Physical Exam Constitutional:      General: He is not in acute distress.    Appearance: He is not ill-appearing.  Cardiovascular:     Rate and Rhythm: Normal rate and regular rhythm.  Pulmonary:     Effort: Pulmonary effort is normal.     Breath sounds: Normal breath sounds.  Abdominal:     Palpations: Abdomen is soft.     Tenderness: There is no abdominal tenderness.  Genitourinary:    Comments: Mild irritation and very small excoriated area at urethral meatus, no blistering/vesicles, no erythema   Skin:    General: Skin is warm and dry.  Neurological:     General: No focal deficit present.     Mental Status: He is alert.  Psychiatric:        Mood and Affect: Mood is not depressed.  Behavior: Behavior normal.          Scheduled Medications:   calcium carbonate  1 tablet Oral TID   docusate sodium  100 mg Oral BID   enoxaparin (LOVENOX) injection  40 mg Subcutaneous Q24H   finasteride  5 mg Oral QPM   gabapentin  300 mg Oral TID   midodrine  10 mg Oral TID WC   multivitamin with minerals  1 tablet Oral Daily   nutrition supplement (JUVEN)  1 packet Oral BID BM   ondansetron (ZOFRAN) IV  4 mg Intravenous Q6H   oxyCODONE  20 mg Oral QID   pantoprazole  40 mg Oral BID   polyethylene glycol  17 g Oral Daily   sodium chloride  1 g Oral BID WC    Continuous Infusions:   PRN Medications:  alum & mag hydroxide-simeth, HYDROmorphone, polyethylene glycol, white petrolatum  Antimicrobials from admission:  Anti-infectives (From admission, onward)    Start     Dose/Rate Route Frequency Ordered Stop   08/23/23 2200  amoxicillin-clavulanate (AUGMENTIN) 875-125 MG per tablet 1 tablet        1 tablet Oral Every 12 hours 08/23/23 1324 08/24/23 2156   08/20/23 0900  ceFEPIme (MAXIPIME) 2 g in sodium chloride 0.9 % 100 mL IVPB  Status:  Discontinued        2 g 200 mL/hr over 30  Minutes Intravenous Every 8 hours 08/20/23 0744 08/23/23 1324   08/19/23 0000  ceFEPIme (MAXIPIME) 2 g in sodium chloride 0.9 % 100 mL IVPB  Status:  Discontinued        2 g 200 mL/hr over 30 Minutes Intravenous Every 12 hours 08/18/23 1755 08/20/23 0744   08/18/23 2000  vancomycin (VANCOREADY) IVPB 500 mg/100 mL        500 mg 100 mL/hr over 60 Minutes Intravenous  Once 08/18/23 1754 08/19/23 0701   08/18/23 1900  metroNIDAZOLE (FLAGYL) IVPB 500 mg  Status:  Discontinued        500 mg 100 mL/hr over 60 Minutes Intravenous Every 12 hours 08/18/23 1808 08/23/23 1324   08/18/23 1800  azithromycin (ZITHROMAX) 500 mg in sodium chloride 0.9 % 250 mL IVPB        500 mg 250 mL/hr over 60 Minutes Intravenous Every 24 hours 08/18/23 1747 08/21/23 0057   08/18/23 1755  vancomycin variable dose per unstable renal function (pharmacist dosing)  Status:  Discontinued         Does not apply See admin instructions 08/18/23 1755 08/19/23 0943   08/18/23 1245  ceFEPIme (MAXIPIME) 2 g in sodium chloride 0.9 % 100 mL IVPB        2 g 200 mL/hr over 30 Minutes Intravenous  Once 08/18/23 1239 08/18/23 1456   08/18/23 1245  metroNIDAZOLE (FLAGYL) IVPB 500 mg        500 mg 100 mL/hr over 60 Minutes Intravenous  Once 08/18/23 1239 08/18/23 1456   08/18/23 1245  vancomycin (VANCOCIN) IVPB 1000 mg/200 mL premix        1,000 mg 200 mL/hr over 60 Minutes Intravenous  Once 08/18/23 1239 08/18/23 1412           Data Reviewed:  I have personally reviewed the following...  CBC: Recent Labs  Lab 08/20/23 0517 08/21/23 0922 08/22/23 0503 08/23/23 0501 08/25/23 0535  WBC 10.8* 9.4 5.9  --  7.1  HGB 9.6* 10.0* 9.0* 9.6* 10.2*  HCT 29.6* 30.5* 26.8*  --  31.8*  MCV 79.8* 79.6*  77.9*  --  79.9*  PLT 189 196 184  --  279   Basic Metabolic Panel: Recent Labs  Lab 08/20/23 0517 08/21/23 0922 08/22/23 0503 08/23/23 0501 08/23/23 0502 08/24/23 0511 08/25/23 0535  NA 128* 126* 129*  --  132* 132* 131*   K 3.1* 3.6 3.6  --  3.3* 3.5 3.8  CL 91* 90* 94*  --  98 96* 93*  CO2 25 26 24   --  24 26 28   GLUCOSE 114* 91 79  --  87 93 92  BUN 52* 28* 20  --  15 12 11   CREATININE 1.19 0.95 0.77  --  0.78 0.79 0.77  CALCIUM 8.2* 8.4* 8.1*  --  8.1* 8.3* 8.4*  MG 2.1 1.7 1.8 1.8  --   --  1.5*  PHOS 2.1* 1.5* 1.7*  --  1.8* 2.2* 2.2*   GFR: Estimated Creatinine Clearance: 93.8 mL/min (by C-G formula based on SCr of 0.77 mg/dL). Liver Function Tests: Recent Labs  Lab 08/20/23 0517 08/21/23 0922 08/22/23 0503 08/23/23 0502  ALBUMIN 2.5* 2.5* 2.4* 2.2*   No results for input(s): "LIPASE", "AMYLASE" in the last 168 hours. No results for input(s): "AMMONIA" in the last 168 hours. Coagulation Profile: No results for input(s): "INR", "PROTIME" in the last 168 hours.  Cardiac Enzymes: No results for input(s): "CKTOTAL", "CKMB", "CKMBINDEX", "TROPONINI" in the last 168 hours. BNP (last 3 results) No results for input(s): "PROBNP" in the last 8760 hours. HbA1C: No results for input(s): "HGBA1C" in the last 72 hours. CBG: Recent Labs  Lab 08/21/23 1145 08/21/23 1545 08/22/23 0454 08/22/23 1608 08/24/23 1727  GLUCAP 99 83 85 72 92   Lipid Profile: No results for input(s): "CHOL", "HDL", "LDLCALC", "TRIG", "CHOLHDL", "LDLDIRECT" in the last 72 hours. Thyroid Function Tests: No results for input(s): "TSH", "T4TOTAL", "FREET4", "T3FREE", "THYROIDAB" in the last 72 hours. Anemia Panel: No results for input(s): "VITAMINB12", "FOLATE", "FERRITIN", "TIBC", "IRON", "RETICCTPCT" in the last 72 hours. Most Recent Urinalysis On File:     Component Value Date/Time   COLORURINE AMBER (A) 08/18/2023 1910   APPEARANCEUR HAZY (A) 08/18/2023 1910   LABSPEC 1.020 08/18/2023 1910   PHURINE 5.0 08/18/2023 1910   GLUCOSEU NEGATIVE 08/18/2023 1910   HGBUR SMALL (A) 08/18/2023 1910   BILIRUBINUR NEGATIVE 08/18/2023 1910   BILIRUBINUR negative 04/07/2019 1643   KETONESUR NEGATIVE 08/18/2023 1910    PROTEINUR NEGATIVE 08/18/2023 1910   UROBILINOGEN 0.2 04/07/2019 1643   NITRITE NEGATIVE 08/18/2023 1910   LEUKOCYTESUR NEGATIVE 08/18/2023 1910   Sepsis Labs: @LABRCNTIP (procalcitonin:4,lacticidven:4) Microbiology: Recent Results (from the past 240 hours)  Blood Culture (routine x 2)     Status: None   Collection Time: 08/18/23 12:35 PM   Specimen: BLOOD  Result Value Ref Range Status   Specimen Description   Final    BLOOD RIGHT AC Performed at [facility], [address], [place], [place] [address]    Special Requests   Final    BOTTLES DRAWN AEROBIC AND ANAEROBIC Blood Culture results may not be optimal due to an inadequate volume of blood received in culture bottles Performed at [facility], [address] [place], [place] [address]    Culture   Final    NO GROWTH 5 DAYS Performed at [facility], [address], [place], [place] [address]    Report Status 08/23/2023 FINAL  Final  Resp panel by RT-PCR (RSV, Flu A&B, Covid) Anterior Nasal Swab     Status:  None   Collection Time: 08/18/23  1:13 PM   Specimen: Anterior Nasal Swab  Result Value Ref Range Status   SARS Coronavirus 2 by RT PCR NEGATIVE NEGATIVE Final    Comment: (NOTE) SARS-CoV-2 target nucleic acids are NOT DETECTED.  The SARS-CoV-2 RNA is generally detectable in upper respiratory specimens during the acute phase of infection. The lowest concentration of SARS-CoV-2 viral copies this assay can detect is 138 copies/mL. A negative result does not preclude SARS-Cov-2 infection and should not be used as the sole basis for treatment or other patient management decisions. A negative result may occur with  improper specimen collection/handling, submission of specimen other than nasopharyngeal swab, presence of viral mutation(s) within the areas targeted by this assay, and inadequate number of viral copies(<138 copies/mL). A negative result must be combined with clinical observations,  patient history, and epidemiological information. The expected result is Negative.  Fact Sheet for Patients:  BloggerCourse.com  Fact Sheet for Healthcare Providers:  SeriousBroker.it  This test is no t yet approved or cleared by the [place] FDA and  has been authorized for detection and/or diagnosis of SARS-CoV-2 by FDA under an Emergency Use Authorization (EUA). This EUA will remain  in effect (meaning this test can be used) for the duration of the COVID-19 declaration under Section 564(b)(1) of the Act, 21 U.S.C.section 360bbb-3(b)(1), unless the authorization is terminated  or revoked sooner.       Influenza A by PCR NEGATIVE NEGATIVE Final   Influenza B by PCR NEGATIVE NEGATIVE Final    Comment: (NOTE) The Xpert Xpress SARS-CoV-2/FLU/RSV plus assay is intended as an aid in the diagnosis of influenza from Nasopharyngeal swab specimens and should not be used as a sole basis for treatment. Nasal washings and aspirates are unacceptable for Xpert Xpress SARS-CoV-2/FLU/RSV testing.  Fact Sheet for Patients: BloggerCourse.com  Fact Sheet for Healthcare Providers: SeriousBroker.it  This test is not yet approved or cleared by the [place] FDA and has been authorized for detection and/or diagnosis of SARS-CoV-2 by FDA under an Emergency Use Authorization (EUA). This EUA will remain in effect (meaning this test can be used) for the duration of the COVID-19 declaration under Section 564(b)(1) of the Act, 21 U.S.C. section 360bbb-3(b)(1), unless the authorization is terminated or revoked.     Resp Syncytial Virus by PCR NEGATIVE NEGATIVE Final    Comment: (NOTE) Fact Sheet for Patients: BloggerCourse.com  Fact Sheet for Healthcare Providers: SeriousBroker.it  This test is not yet approved or cleared by the [place] FDA and has been authorized for detection and/or diagnosis of SARS-CoV-2 by FDA under an Emergency Use Authorization (EUA). This EUA will remain in effect (meaning this test can be used) for the duration of the COVID-19 declaration under Section 564(b)(1) of the Act, 21 U.S.C. section 360bbb-3(b)(1), unless the authorization is terminated or revoked.  Performed at [facility], [address] [place], [place] [address]   Blood Culture (routine x 2)     Status: None   Collection Time: 08/18/23  1:13 PM   Specimen: BLOOD  Result Value Ref Range Status   Specimen Description   Final    BLOOD BLOOD LEFT HAND Performed at [facility], [address], [place], [place] [address]    Special Requests   Final    BOTTLES DRAWN AEROBIC AND ANAEROBIC Blood Culture adequate volume Performed at [facility], [address], [place], [place] [address]    Culture   Final  NO GROWTH 5 DAYS Performed at [facility], [address], [place], [place] [address]    Report Status 08/23/2023 FINAL  Final  MRSA Next Gen by PCR, Nasal     Status: None   Collection Time: 08/18/23  5:57 PM   Specimen: Nasal Mucosa; Nasal Swab  Result Value Ref Range Status   MRSA by PCR Next Gen NOT DETECTED NOT DETECTED Final    Comment: (NOTE) The GeneXpert MRSA Assay (FDA approved for NASAL specimens only), is one component of a comprehensive MRSA colonization surveillance program. It is not intended to diagnose MRSA infection nor to guide or monitor treatment for MRSA infections. Test performance is not FDA approved in patients less than 62 years old. Performed at [facility], [address] [place], [place] [address]       Radiology Studies last 3 days: CT Angio Chest Pulmonary Embolism (PE) W or WO Contrast Result Date: 08/24/2023 CLINICAL DATA:  Mid chest pain, short of breath, negative D-dimer, history of colon cancer and peritoneal carcinomatosis  EXAM: CT ANGIOGRAPHY CHEST WITH CONTRAST TECHNIQUE: Multidetector CT imaging of the chest was performed using the standard protocol during bolus administration of intravenous contrast. Multiplanar CT image reconstructions and MIPs were obtained to evaluate the vascular anatomy. RADIATION DOSE REDUCTION: This exam was performed according to the departmental dose-optimization program which includes automated exposure control, adjustment of the mA and/or kV according to patient size and/or use of iterative reconstruction technique. CONTRAST:  75mL OMNIPAQUE IOHEXOL 350 MG/ML SOLN COMPARISON:  11/21/2021, 08/18/2023 FINDINGS: Cardiovascular: This is a technically adequate evaluation of the pulmonary vasculature. No filling defects or pulmonary emboli. The heart is unremarkable without pericardial effusion. Aneurysm of the ascending thoracic aorta and aortic arch measuring up to 4 cm. Evaluation of the aortic lumen is limited due to timing of contrast bolus. There is atherosclerosis throughout the aorta and coronary vasculature. Right chest wall port via internal jugular approach tip within the superior vena cava. Mediastinum/Nodes: Fluid-filled esophagus likely due to reflux given the degree of gastric distension. Thyroid and trachea are unremarkable. Enlarged subcarinal lymph node measuring up to 13 mm, likely reactive. No other pathologic adenopathy. Lungs/Pleura: There is multifocal bilateral airspace disease, most pronounced within the lower lobes, not significantly changed since prior CT. There are small bilateral pleural effusions, right greater than left. No pneumothorax. Stable emphysema. Central airways are patent. Upper Abdomen: Moderate free fluid within the left upper quadrant. Soft tissue stranding within the omentum consistent with previous history of peritoneal carcinomatosis. 3 cm capsular hypodensity along the inferolateral margin of the right lobe liver image 145/4 is not appreciably changed since  prior study, but has developed since 09/28/2022 exam, concerning for capsular metastatic implant. Musculoskeletal: No acute or destructive bony abnormalities. Reconstructed images demonstrate no additional findings. Review of the MIP images confirms the above findings. IMPRESSION: 1. No evidence of pulmonary embolus. 2. Bilateral airspace disease, greatest within the lower lobes, consistent with multifocal pneumonia or widespread aspiration. 3. Small bilateral pleural effusions, right greater than left, new since recent abdominal CT. 4. Upper abdominal findings consistent with metastatic disease and peritoneal carcinomatosis. New free fluid within the left upper quadrant since recent abdominal CT. 5. 4 cm thoracic aortic aneurysm involving the ascending aorta and aortic arch. Recommend semi-annual imaging followup by CTA or MRA and referral to cardiothoracic surgery if not already obtained. This recommendation follows 2010 ACCF/AHA/AATS/ACR/ASA/SCA/SCAI/SIR/STS/SVM Guidelines for the Diagnosis and Management of Patients With Thoracic Aortic Disease. Circulation. 2010; 121:  W098-J19. Aortic aneurysm NOS (ICD10-I71.9) 6. Aortic Atherosclerosis (ICD10-I70.0) and Emphysema (ICD10-J43.9). 7. Subcarinal adenopathy, likely reactive. Electronically Signed   By: Sharlet Salina M.D.   On: 08/24/2023 22:08   MR BRAIN WO CONTRAST Result Date: 08/24/2023 CLINICAL DATA:  Initial evaluation for acute neuro deficit, stroke suspected. EXAM: MRI HEAD WITHOUT CONTRAST TECHNIQUE: Multiplanar, multiecho pulse sequences of the brain and surrounding structures were obtained without intravenous contrast. COMPARISON:  Prior brain MRI from 07/08/2023. FINDINGS: Brain: Cerebral volume within normal limits for age. Patchy T2/FLAIR hyperintensity involving the periventricular deep white matter both cerebral hemispheres, consistent with chronic small vessel ischemic disease, mild in nature. No evidence for acute or subacute ischemia.  Gray-white matter differentiation maintained. No areas of chronic cortical infarction. No acute intracranial hemorrhage. Scattered areas of superficial siderosis noted about a few cortical sulci of the right cerebral hemisphere, most notably about the central sulcus (series 10, image 53). Findings consistent with prior subarachnoid hemorrhage at these locations, stable from prior. Few additional punctate chronic micro hemorrhages noted. No mass lesion, midline shift or mass effect. No hydrocephalus or extra-axial fluid collection. Pituitary gland and suprasellar region within normal limits. Vascular: Loss of normal flow void within the hypoplastic right vertebral artery, which could be related to slow flow and/or occlusion, stable from prior. Major intracranial vascular flow voids are otherwise maintained. Skull and upper cervical spine: Craniocervical junction within normal limits. Bone marrow signal intensity normal. No scalp soft tissue abnormality. Sinuses/Orbits: Globes and orbital soft tissues within normal limits. Mild mucosal thickening noted about the ethmoidal air cells and maxillary sinuses. Small right mastoid effusion, similar to prior, and of doubtful significance. Negative nasopharynx. Other: None. IMPRESSION: 1. No acute intracranial abnormality. 2. Mild chronic microvascular ischemic disease for age. 3. Scattered areas of superficial siderosis about a few cortical sulci of the right cerebral hemisphere, consistent with prior subarachnoid hemorrhage, stable from prior. 4. Loss of normal flow void within the hypoplastic right vertebral artery, which could be related to slow flow and/or occlusion, also stable from prior. Electronically Signed   By: Rise Mu M.D.   On: 08/24/2023 04:42         Sunnie Nielsen, DO [facility] 08/26/2023, 4:19 PM    Dictation software may have been used to generate the above note. Typos may occur and escape review in typed/dictated  notes. Please contact Dr Lyn Hollingshead directly for clarity if needed.  Staff may message me via secure chat in Epic  but this may not receive an immediate response,  please page me for urgent matters!  If 7PM-7AM, please contact night coverage [email or web address]

## 2023-08-26 NOTE — Progress Notes (Addendum)
PT Cancellation Note  Patient Details Name: Patrick Bailey. MRN: [id number] DOB: Jun 22, 1961  Addendum: patient declined PT again this pm as his dad was visiting. Will re-attempt at later date.     Cancelled Treatment:    Reason Eval/Treat Not Completed: Pain limiting ability to participate. Patient requests pm session. Will try to come back later as able. Otherwise will be tomorrow.    Terin Dierolf 08/26/2023, 10:42 AM

## 2023-08-26 NOTE — Plan of Care (Signed)
  Problem: Education: Goal: Knowledge of General Education information will improve Description: Including pain rating scale, medication(s)/side effects and non-pharmacologic comfort measures Outcome: Progressing   Problem: Activity: Goal: Risk for activity intolerance will decrease Outcome: Progressing   Problem: Nutrition: Goal: Adequate nutrition will be maintained Outcome: Progressing   Problem: Coping: Goal: Level of anxiety will decrease Outcome: Progressing   Problem: Elimination: Goal: Will not experience complications related to bowel motility Outcome: Progressing   Problem: Pain Managment: Goal: General experience of comfort will improve and/or be controlled Outcome: Progressing   Problem: Skin Integrity: Goal: Risk for impaired skin integrity will decrease Outcome: Progressing

## 2023-08-27 DIAGNOSIS — I951 Orthostatic hypotension: Secondary | ICD-10-CM | POA: Diagnosis not present

## 2023-08-27 LAB — BASIC METABOLIC PANEL
Anion gap: 11 (ref 5–15)
BUN: 17 mg/dL (ref 8–23)
CO2: 26 mmol/L (ref 22–32)
Calcium: 8.5 mg/dL — ABNORMAL LOW (ref 8.9–10.3)
Chloride: 95 mmol/L — ABNORMAL LOW (ref 98–111)
Creatinine, Ser: 0.94 mg/dL (ref 0.61–1.24)
GFR, Estimated: >60 mL/min (ref >60)
Glucose, Bld: 73 mg/dL (ref 70–99)
Potassium: 4.4 mmol/L (ref 3.5–5.1)
Sodium: 132 mmol/L — ABNORMAL LOW (ref 135–145)

## 2023-08-27 LAB — CBC
HCT: 33 % — ABNORMAL LOW (ref 39.0–52.0)
Hemoglobin: 10.3 g/dL — ABNORMAL LOW (ref 13.0–17.0)
MCH: 25.9 pg — ABNORMAL LOW (ref 26.0–34.0)
MCHC: 31.2 g/dL (ref 30.0–36.0)
MCV: 83.1 fL (ref 80.0–100.0)
Platelets: 289 10*3/uL (ref 150–400)
RBC: 3.97 MIL/uL — ABNORMAL LOW (ref 4.22–5.81)
RDW: 17.9 % — ABNORMAL HIGH (ref 11.5–15.5)
WBC: 8.7 10*3/uL (ref 4.0–10.5)
nRBC: 0 % (ref 0.0–0.2)

## 2023-08-27 MED ORDER — ADULT MULTIVITAMIN W/MINERALS CH: 1.0000 | ORAL_TABLET | Freq: Every day | ORAL | Status: DC

## 2023-08-27 MED ORDER — OXYCODONE HCL 20 MG PO TABS: 10.0000 mg | ORAL_TABLET | Freq: Four times a day (QID) | ORAL | 0 refills | Status: DC | PRN

## 2023-08-27 MED ORDER — CALCIUM CARBONATE ANTACID 500 MG PO CHEW: 1.0000 | CHEWABLE_TABLET | Freq: Three times a day (TID) | ORAL | Status: DC

## 2023-08-27 MED ORDER — POLYETHYLENE GLYCOL 3350 17 G PO PACK: 17.0000 g | PACK | Freq: Every day | ORAL | 0 refills | Status: DC

## 2023-08-27 MED ORDER — ALUM & MAG HYDROXIDE-SIMETH 200-200-20 MG/5ML PO SUSP: 30.0000 mL | Freq: Four times a day (QID) | ORAL | Status: DC | PRN

## 2023-08-27 MED ORDER — MIDODRINE HCL 10 MG PO TABS: 10.0000 mg | ORAL_TABLET | Freq: Three times a day (TID) | ORAL | 0 refills | Status: DC

## 2023-08-27 MED ORDER — SODIUM CHLORIDE 1 G PO TABS: 1.0000 g | ORAL_TABLET | Freq: Two times a day (BID) | ORAL | 0 refills | Status: DC

## 2023-08-27 MED ORDER — FINASTERIDE 5 MG PO TABS: 5.0000 mg | ORAL_TABLET | Freq: Every evening | ORAL | 0 refills | Status: DC

## 2023-08-27 MED ORDER — DOCUSATE SODIUM 100 MG PO CAPS: 100.0000 mg | ORAL_CAPSULE | Freq: Two times a day (BID) | ORAL | 0 refills | Status: DC

## 2023-08-27 NOTE — TOC Transition Note (Signed)
Transition of Care [facility]) - Discharge Note   Patient Details  Name: Patrick Bailey. MRN: [id number] Date of Birth: 1960/09/14  [facility] CM/SW Contact:  Allena Katz, LCSW Phone Number: 08/27/2023, 12:43 PM   Clinical Narrative:   CSW shared copay amount with pt for [facility] services. Pt does not want HH. He does want the 3in1 and RW. This was ordered through [place] with [employer]. Pt reports no other TOC needs.     Final next level of care: Home w Home Health Services Barriers to Discharge: Barriers Resolved   Patient Goals and CMS Choice Patient states their goals for this hospitalization and ongoing recovery are:: Discharge home CMS Medicare.gov Compare Post Acute Care list provided to:: Patient Choice offered to / list presented to : Patient      Discharge Placement                    Patient and family notified of of transfer: 08/27/23  Discharge Plan and Services Additional resources added to the After Visit Summary for                  DME Arranged: 3-N-1, Walker rolling DME Agency: [employer] Date DME Agency Contacted: 08/27/23   Representative spoke with at DME Agency: jon            Social Drivers of Health (SDOH) Interventions SDOH Screenings   Food Insecurity: No Food Insecurity (08/20/2023)  Housing: Low Risk  (08/20/2023)  Transportation Needs: No Transportation Needs (08/20/2023)  Utilities: Not At Risk (08/20/2023)  Depression (PHQ2-9): Medium Risk (01/06/2022)  Financial Resource Strain: Low Risk  (12/15/2021)   [facility]  Tobacco Use: Medium Risk (08/18/2023)  Health Literacy: Low Risk  (12/15/2021)   Received from [facility], [facility]     Readmission Risk Interventions    08/24/2023    3:35 PM 07/06/2023   10:49 AM  Readmission Risk Prevention Plan  Transportation Screening Complete Complete  Medication Review Oceanographer) Complete Complete  PCP or Specialist appointment  within 3-5 days of discharge Complete Complete  HRI or Home Care Consult Complete Complete  SW Recovery Care/Counseling Consult Complete Complete  Palliative Care Screening Not Applicable Not Applicable  Skilled Nursing Facility Not Applicable Not Complete  SNF Comments  patient not agreeable too SNF

## 2023-08-27 NOTE — Progress Notes (Signed)
Patient was offered x 2 to be bladder scanned. Last night said he was sleepy and would wait until morning. This morning said he urinated and had a BM.

## 2023-08-27 NOTE — TOC CM/SW Note (Signed)
Patient is not able to walk the distance required to go the bathroom, or he/she is unable to safely negotiate stairs required to access the bathroom.  A 3in1 BSC will alleviate this problem

## 2023-08-27 NOTE — Progress Notes (Signed)
OT Cancellation Note  Patient Details Name: Patrick Bailey. MRN: [id number] DOB: October 04, 1960   Cancelled Treatment:    Reason Eval/Treat Not Completed: Other (comment). Pt politely declining participation in OT at this time; states he is going home and walked in the hallway earlier. No further OT needs at this time, OT will sign off.  Alyah Boehning L. Zacharee Gaddie, OTR/L  08/27/23, 2:12 PM

## 2023-08-27 NOTE — Progress Notes (Signed)
Physical Therapy Treatment Patient Details Name: Patrick Bailey. MRN: [id number] DOB: [birth year] Today's Date: 08/27/2023   History of Present Illness Pt is a 63 y.o. male presenting to hospital 08/18/23 with c/o abdominal pain, emesis, nausea, SOB, and weakness.  Pt admitted with septic shock secondary to CAP and possible intraabdominal source.  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    PT Comments  Pt alert, agreeable to PT, premedicated prior to session. Pt demonstrated great progression towards goals today and had minimal complaints of light headedness that did not limit mobility. modI for bed mobility, modI for sit <> stand transfers with RW. He ambulated ~70ft with supervision, no LOB. Returned to room up in recliner, pt waiting for breakfast. The patient would benefit from further skilled PT intervention to continue to progress towards goals.     If plan is discharge home, recommend the following: A little help with walking and/or transfers;A little help with bathing/dressing/bathroom;Assistance with cooking/housework;Assist for transportation;Help with stairs or ramp for entrance   Can travel by private vehicle        Equipment Recommendations  Rolling walker (2 wheels);BSC/3in1    Recommendations for Other Services       Precautions / Restrictions Precautions Precautions: Fall Precaution Comments: R chest port Restrictions Weight Bearing Restrictions Per Provider Order: No     Mobility  Bed Mobility Overal bed mobility: Modified Independent       Supine to sit: Modified independent (Device/Increase time)          Transfers Overall transfer level: Needs assistance Equipment used: Rolling walker (2 wheels) Transfers: Sit to/from Stand                  Ambulation/Gait Ambulation/Gait assistance: Supervision Gait Distance (Feet): 90 Feet Assistive device:  Rolling walker (2 wheels)         General Gait Details: denied dizziness, no LOB   Stairs             Wheelchair Mobility     Tilt Bed    Modified Rankin (Stroke Patients Only)       Balance Overall balance assessment: Needs assistance Sitting-balance support: No upper extremity supported, Feet supported Sitting balance-Leahy Scale: Good Sitting balance - Comments: no LOB or unsteadiness while seated in recliner for ADLs   Standing balance support: Bilateral upper extremity supported Standing balance-Leahy Scale: Fair                              Cognition Arousal: Alert Behavior During Therapy: WFL for tasks assessed/performed Overall Cognitive Status: Within Functional Limits for tasks assessed                                          Exercises      General Comments        Pertinent Vitals/Pain Pain Assessment Pain Assessment: Faces Faces Pain Scale: Hurts a little bit Pain Location: abdominal pain Pain Descriptors / Indicators: Sore Pain Intervention(s): Limited activity within patient's tolerance, Monitored during session, Premedicated before session    Home Living                          Prior Function  PT Goals (current goals can now be found in the care plan section) Progress towards PT goals: Progressing toward goals    Frequency    Min 1X/week      PT Plan      Co-evaluation              AM-PAC PT "6 Clicks" Mobility   Outcome Measure  Help needed turning from your back to your side while in a flat bed without using bedrails?: None Help needed moving from lying on your back to sitting on the side of a flat bed without using bedrails?: None Help needed moving to and from a bed to a chair (including a wheelchair)?: None Help needed standing up from a chair using your arms (e.g., wheelchair or bedside chair)?: None Help needed to walk in hospital room?: None Help  needed climbing 3-5 steps with a railing? : A Little 6 Click Score: 23    End of Session   Activity Tolerance: Patient tolerated treatment well Patient left: with call bell/phone within reach;in chair Nurse Communication: Mobility status PT Visit Diagnosis: Other abnormalities of gait and mobility (R26.89);Muscle weakness (generalized) (M62.81);History of falling (Z91.81);Pain Pain - part of body:  (abdomen)     Time: 1610-9604 PT Time Calculation (min) (ACUTE ONLY): 12 min  Charges:    $Therapeutic Activity: 8-22 mins PT General Charges $$ ACUTE PT VISIT: 1 Visit                     Olga Coaster PT, DPT 9:27 AM,08/27/23

## 2023-08-27 NOTE — Progress Notes (Signed)
Mobility Specialist - Progress Note     08/27/23 1200  Mobility  Activity Ambulated with assistance in hallway  Level of Assistance Standby assist, set-up cues, supervision of patient - no hands on  Assistive Device Front wheel walker  Distance Ambulated (ft) 160 ft  Range of Motion/Exercises Active  Activity Response Tolerated well  Mobility Referral Yes  Mobility visit 1 Mobility   Pt resting in recliner on RA upon entry. Pt STS and ambulates to hallway around NS. Pt endorses chronic pain in stomach and heart burn. RN notified. Pt returned to recliner and left with needs in reach.   Johnathan Hausen Mobility Specialist 08/27/23, 2:33 PM

## 2023-08-27 NOTE — Discharge Summary (Signed)
Physician Discharge Summary   Patient: Patrick Bailey. MRN: [id number]  DOB: [birth year]   Admit:     Date of Admission: 08/18/2023 Admitted from: home   Discharge: Date of discharge: 08/27/23 Disposition: Home Condition at discharge: good  CODE STATUS: FULL CODE     Discharge Physician: Sunnie Nielsen, DO [facility]     PCP: Eustaquio Boyden, MD  Recommendations for Outpatient Follow-up:  Follow up with PCP Eustaquio Boyden, MD in 1-2 weeks Follow up with oncology as directed     Discharge Instructions     Diet general   Complete by: As directed    Increase activity slowly   Complete by: As directed          Discharge Diagnoses: Principal Problem:   Orthostatic hypotension Active Problems:   Septic shock (HCC)   Multifocal pneumonia   Acute respiratory failure with hypoxia (HCC)   Appendix carcinoma (HCC)   AKI (acute kidney injury) (HCC)   Electrolyte abnormality   Acute urinary retention   Protein-calorie malnutrition, severe   Palliative care encounter   Anemia of chronic disease   Tachycardia         Hospital course / significant events:   HPI: 63 year old man past medical history of hypertension, hyperlipidemia, right MCA stroke back in 2023, hypothyroidism, neuropathy, 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  Presented to the ER not feeling well with nasal congestion and night sweats.  On EMS arrival at home he was found to be hypotensive cold and unable to obtain SpO2 so he was placed on nonrebreather.  01/15: admitted to ICU with septic shock due to pneumonia, requiring pressors. NG in place. CT chest multifocal opacities, CT abdomen pelvis with distended stomach and fluid-filled esophagus likely gastroparesis in the setting of being chronically on opioids. 01/16: off pressors, BP still low.  01/17: Transferred to medicine service. Patient off pressors but blood pressure still on the  lower side. Continue antibiotics for pneumonia.  Reviewed previous CT scan with general surgery and they believe that this is likely fluid collection from prior cancer rather than an abscess since it was seen on prior CT scan also.  01/18.  Patient felt dizzy with walking around today.  Did well with physical therapy yesterday, orthostatic today.  Gave a fluid bolus and started on low-dose midodrine.  Metoprolol discontinued yesterday.   01/19.  Still dizzy/orthostatic. Increasing midodrine to 10 mg 3 times daily.  01/20.  pain on urination but has a Foley catheter that looks like it is draining.  Asked nursing staff to flush.  Still having problems just with standing with physical therapist today.  Blood pressures and heart rates labile 01/21.  Patient still having some pain right lower quadrant.  Nursing staff flushed his Foley.  Patient states he is still symptomatic with standing up with dizziness and lightheadedness. CTA chest, no PE, also showed upper abdominal findings consistent with metastatic disease and peritoneal carcinomatosis. New free fluid within the left upper quadrant since recent abdominal CT. 01/22: void trial today. Pt c/o pain at urethral meatus, Foley removed  01/23: minimal UOP and minimal urine on bladder scan, hydrate and monitor I&O. Pain is improved today  01/24: pt urinating, difficult stream but he refuses replacement of catheter and feels ok to follow outpatient. Pain is improved. Breathing feels normal. Stable for discharge, he declined home health services, was given rx for DME w/ walker, shower chair, BSC. Limited Rx pain meds and  he will need to follow w/ oncology for refills      Consultants:  ICU Palliative Care / Oncology   Procedures/Surgeries: non      ASSESSMENT & PLAN:   Orthostatic hypotension Metoprolol discontinued 5 days ago.   A.m. cortisol normal range.   MRI of the brain did not show any acute abnormalities but did show prior subarachnoid  hemorrhage.  Continue midodrine to 10 mg 3 times daily.   TED hose.   Advise continuing activity and working with physical therapy.   Hold Flomax.   Septic shock - resolved  Due to CAP / Multifocal pneumonia Present on admission with acute hypoxic respiratory failure, lactic acidosis, acute kidney injury, bilateral pneumonia, leukocytosis, hypotension and tachycardia.   Completed Zithromax.   Switched Maxipime and Flagyl over to Augmentin and completed 7 days.   Monitor sepsis paramters    Acute respiratory failure with hypoxia - resolved  Patient required high flow nasal cannula during the hospital course for hypoxia (pulse ox 87% on 6 L on 1/15).  Now breathing on room air.   check pulse ox with ambulation.   Appendix carcinoma  CT showed upper abdominal findings consistent with metastatic disease and peritoneal carcinomatosis.  Follow-up with oncologist as outpatient.   Chronic pain  PDMP reviewed: home meds recent Rx filled and adjustments as follows 01/01 Gabapentin 300 mg tid (#42 x14d) - continued here  01/01 Oxycodone IR 20 mg qid (#56 x14d) - has been on 10 mg qid here --> will increase to 20 mg qid scheduled  12/24 Hydromorphone 4 mg #30 x5d - will hold on hydromorphone here unless breakthrough and then can have 2 mg po  Limited Rx pain meds on discharge for the oxycodone, and he will need to follow w/ oncology for refills   Electrolyte abnormality Hypokalemia, hypophosphatemia, hypomagnesemia and hyponatremia.   Continue salt tablets for sodium of 132.   IV K-Phos for potassium and phosphorus being low.   AKI (acute kidney injury) - resolved Creatinine on presentation 2.52. Monitor BMP   Acute urinary retention Hold Flomax for orthostatic hypotension.   Started Proscar  D/c coud catheter 2 days ago pt urinating, difficult stream but he refuses replacement of catheter and feels ok to follow outpatient.    Protein-calorie malnutrition, severe Continue  supplements   Tachycardia Metoprolol held with orthostatic hypotension.    Anemia of chronic disease Last hemoglobin 9.6 Monitor CBC                Discharge Instructions  Allergies as of 08/27/2023       Reactions   Hydrocodone Hives   Per pt able to tolerate hydromorphone        Medication List     STOP taking these medications    amitriptyline 100 MG tablet Commonly known as: ELAVIL   metoprolol tartrate 100 MG tablet Commonly known as: LOPRESSOR   morphine 15 MG tablet Commonly known as: MSIR   potassium chloride 10 MEQ tablet Commonly known as: KLOR-CON M   tamsulosin 0.4 MG Caps capsule Commonly known as: FLOMAX       TAKE these medications    alum & mag hydroxide-simeth 200-200-20 MG/5ML suspension Commonly known as: MAALOX/MYLANTA Take 30 mLs by mouth every 6 (six) hours as needed for indigestion or heartburn.   aspirin EC 81 MG tablet Take 1 tablet (81 mg total) by mouth daily. Swallow whole.   atorvastatin 40 MG tablet Commonly known as: LIPITOR Take 1 tablet (40 mg  total) by mouth daily.   calcium carbonate 500 MG chewable tablet Commonly known as: TUMS - dosed in mg elemental calcium Chew 1 tablet (200 mg of elemental calcium total) by mouth 3 (three) times daily.   clopidogrel 75 MG tablet Commonly known as: PLAVIX Take 1 tablet by mouth once daily   docusate sodium 100 MG capsule Commonly known as: COLACE Take 1 capsule (100 mg total) by mouth 2 (two) times daily.   fenofibrate 160 MG tablet Take 1 tablet by mouth once daily   finasteride 5 MG tablet Commonly known as: PROSCAR Take 1 tablet (5 mg total) by mouth every evening.   gabapentin 400 MG capsule Commonly known as: NEURONTIN Take 1 capsule (400 mg total) by mouth 2 (two) times daily AND 3 capsules (1,200 mg total) at bedtime. TAKE 1 CAPSULE BY MOUTH TWICE DAILY AND 3 AT BEDTIME Strength: 400 mg.   levothyroxine 150 MCG tablet Commonly known as:  SYNTHROID Take 1 tablet (150 mcg total) by mouth daily.   lipase/protease/amylase 19147 UNITS Cpep capsule Commonly known as: Creon Take 2 capsules (72,000 Units total) by mouth 3 (three) times daily with meals. May also take 1 capsule (36,000 Units total) as needed (with snacks - up to 4 snacks daily).   MAGnesium-Oxide 400 (240 Mg) MG tablet Generic drug: magnesium oxide Take 1 tablet by mouth 2 (two) times daily.   methocarbamol 500 MG tablet Commonly known as: ROBAXIN Take 1,000 mg by mouth 2 (two) times daily.   midodrine 10 MG tablet Commonly known as: PROAMATINE Take 1 tablet (10 mg total) by mouth 3 (three) times daily with meals.   Mitigare 0.6 MG Caps Generic drug: Colchicine Take 0.6 mg by mouth daily as needed (gout flare). TAKE 1 CAPSULE BY MOUTH IN THE MORNING AS NEEDED What changed: additional instructions   multivitamin with minerals Tabs tablet Take 1 tablet by mouth daily. Start taking on: August 28, 2023   naloxone 4 MG/0.1ML Liqd nasal spray kit Commonly known as: Counselling psychologist 1 spray into the nose daily.   ondansetron 4 MG tablet Commonly known as: Zofran Take 1 tablet (4 mg total) by mouth daily as needed for nausea or vomiting.   Oxycodone HCl 20 MG Tabs Take 0.5-1 tablets (10-20 mg total) by mouth every 6 (six) hours as needed for severe pain (pain score 7-10) or moderate pain (pain score 4-6). What changed:  medication strength how much to take reasons to take this   pantoprazole 40 MG tablet Commonly known as: PROTONIX Take 1 tablet (40 mg total) by mouth daily.   polyethylene glycol 17 g packet Commonly known as: MIRALAX / GLYCOLAX Take 17 g by mouth daily. Start taking on: August 28, 2023   prochlorperazine 10 MG tablet Commonly known as: COMPAZINE Take 10 mg by mouth every 6 (six) hours as needed.   sodium chloride 1 g tablet Take 1 tablet (1 g total) by mouth 2 (two) times daily with a meal.               Durable  Medical Equipment  (From admission, onward)           Start     Ordered   08/27/23 1229  DME Walker  Once       Question Answer Comment  Walker: With 5 Inch Wheels   Patient needs a walker to treat with the following condition Cancer (HCC)      08/27/23 1229   08/27/23 1229  DME  3-in-1  Once        08/27/23 1229   08/27/23 1229  DME Shower stool  Once        08/27/23 1229             Follow-up Information     Eustaquio Boyden, MD Follow up.   Specialty: Family Medicine Why: Hospital follow up Contact information: [address] [place] [address] [phone]                 Allergies  Allergen Reactions   Hydrocodone Hives    Per pt able to tolerate hydromorphone     Subjective: pt reports feeling pretty good today, no other concerns. Pain is controlled. He is urinating, difficult stream but he refuses replacement of catheter and feels ok to follow outpatient. Pain is improved. Breathing feels normal.   Discharge Exam: BP 128/83 (BP Location: Left Arm)   Pulse (!) 102   Temp 98.1 F (36.7 C)   Resp 18   Ht 6' (1.829 m)   Wt 70.2 kg   SpO2 97%   BMI 20.99 kg/m  General: Pt is alert, awake, not in acute distress Cardiovascular: RRR, S1/S2 +, no rubs, no gallops Respiratory: CTA bilaterally, no wheezing, no rhonchi Abdominal: Soft, NT, ND, bowel sounds + Extremities: no edema, no cyanosis     The results of significant diagnostics from this hospitalization (including imaging, microbiology, ancillary and laboratory) are listed below for reference.     Microbiology: Recent Results (from the past 240 hours)  Blood Culture (routine x 2)     Status: None   Collection Time: 08/18/23 12:35 PM   Specimen: BLOOD  Result Value Ref Range Status   Specimen Description   Final    BLOOD RIGHT [facility] Performed at [facility], [address], [place], [place] [address]    Special Requests   Final    BOTTLES DRAWN AEROBIC AND  ANAEROBIC Blood Culture results may not be optimal due to an inadequate volume of blood received in culture bottles Performed at [facility] [address], [place], [place] [address]    Culture   Final    NO GROWTH 5 DAYS Performed at [facility], [address] [place], [place] [address]    Report Status 08/23/2023 FINAL  Final  Resp panel by RT-PCR (RSV, Flu A&B, Covid) Anterior Nasal Swab     Status: None   Collection Time: 08/18/23  1:13 PM   Specimen: Anterior Nasal Swab  Result Value Ref Range Status   SARS Coronavirus 2 by RT PCR NEGATIVE NEGATIVE Final    Comment: (NOTE) SARS-CoV-2 target nucleic acids are NOT DETECTED.  The SARS-CoV-2 RNA is generally detectable in upper respiratory specimens during the acute phase of infection. The lowest concentration of SARS-CoV-2 viral copies this assay can detect is 138 copies/mL. A negative result does not preclude SARS-Cov-2 infection and should not be used as the sole basis for treatment or other patient management decisions. A negative result may occur with  improper specimen collection/handling, submission of specimen other than nasopharyngeal swab, presence of viral mutation(s) within the areas targeted by this assay, and inadequate number of viral copies(<138 copies/mL). A negative result must be combined with clinical observations, patient history, and epidemiological information. The expected result is Negative.  Fact Sheet for Patients:  BloggerCourse.com  Fact Sheet for Healthcare Providers:  SeriousBroker.it  This test is no t yet approved or cleared by the [place] FDA and  has been authorized for detection and/or diagnosis of SARS-CoV-2 by FDA under an Emergency Use Authorization (EUA). This EUA will remain  in effect (meaning this test can be used) for the duration of the COVID-19 declaration under Section 564(b)(1) of the Act,  21 U.S.C.section 360bbb-3(b)(1), unless the authorization is terminated  or revoked sooner.       Influenza A by PCR NEGATIVE NEGATIVE Final   Influenza B by PCR NEGATIVE NEGATIVE Final    Comment: (NOTE) The Xpert Xpress SARS-CoV-2/FLU/RSV plus assay is intended as an aid in the diagnosis of influenza from Nasopharyngeal swab specimens and should not be used as a sole basis for treatment. Nasal washings and aspirates are unacceptable for Xpert Xpress SARS-CoV-2/FLU/RSV testing.  Fact Sheet for Patients: BloggerCourse.com  Fact Sheet for Healthcare Providers: SeriousBroker.it  This test is not yet approved or cleared by the [place] FDA and has been authorized for detection and/or diagnosis of SARS-CoV-2 by FDA under an Emergency Use Authorization (EUA). This EUA will remain in effect (meaning this test can be used) for the duration of the COVID-19 declaration under Section 564(b)(1) of the Act, 21 U.S.C. section 360bbb-3(b)(1), unless the authorization is terminated or revoked.     Resp Syncytial Virus by PCR NEGATIVE NEGATIVE Final    Comment: (NOTE) Fact Sheet for Patients: BloggerCourse.com  Fact Sheet for Healthcare Providers: SeriousBroker.it  This test is not yet approved or cleared by the [place] FDA and has been authorized for detection and/or diagnosis of SARS-CoV-2 by FDA under an Emergency Use Authorization (EUA). This EUA will remain in effect (meaning this test can be used) for the duration of the COVID-19 declaration under Section 564(b)(1) of the Act, 21 U.S.C. section 360bbb-3(b)(1), unless the authorization is terminated or revoked.  Performed at [facility], [address] [place], [place] [address]   Blood Culture (routine x 2)     Status: None   Collection Time: 08/18/23  1:13 PM   Specimen: BLOOD  Result Value Ref Range  Status   Specimen Description   Final    BLOOD BLOOD LEFT HAND Performed at [facility], [address], [place], [place] [address]    Special Requests   Final    BOTTLES DRAWN AEROBIC AND ANAEROBIC Blood Culture adequate volume Performed at [facility], [address], [place], [place] [address]    Culture   Final    NO GROWTH 5 DAYS Performed at [facility], [address] [place], [place] [address]    Report Status 08/23/2023 FINAL  Final  MRSA Next Gen by PCR, Nasal     Status: None   Collection Time: 08/18/23  5:57 PM   Specimen: Nasal Mucosa; Nasal Swab  Result Value Ref Range Status   MRSA by PCR Next Gen NOT DETECTED NOT DETECTED Final    Comment: (NOTE) The GeneXpert MRSA Assay (FDA approved for NASAL specimens only), is one component of a comprehensive MRSA colonization surveillance program. It is not intended to diagnose MRSA infection nor to guide or monitor treatment for MRSA infections. Test performance is not FDA approved in patients less than 71 years old. Performed at [facility], [address] [place], [place] [address]      Labs: BNP (last 3 results) Recent Labs    07/04/23 1314 08/18/23 1235  BNP 52.3 38.1   Basic Metabolic Panel: Recent Labs  Lab 08/21/23 0922 08/22/23 0503 08/23/23 0501 08/23/23 0502 08/24/23  1610 08/25/23 0535 08/27/23 0548  NA 126* 129*  --  132* 132* 131* 132*  K 3.6 3.6  --  3.3* 3.5 3.8 4.4  CL 90* 94*  --  98 96* 93* 95*  CO2 26 24  --  24 26 28 26   GLUCOSE 91 79  --  87 93 92 73  BUN 28* 20  --  15 12 11 17   CREATININE 0.95 0.77  --  0.78 0.79 0.77 0.94  CALCIUM 8.4* 8.1*  --  8.1* 8.3* 8.4* 8.5*  MG 1.7 1.8 1.8  --   --  1.5*  --   PHOS 1.5* 1.7*  --  1.8* 2.2* 2.2*  --    Liver Function Tests: Recent Labs  Lab 08/21/23 0922 08/22/23 0503 08/23/23 0502  ALBUMIN 2.5* 2.4* 2.2*   No results for input(s): "LIPASE", "AMYLASE" in the last 168 hours. No results for  input(s): "AMMONIA" in the last 168 hours. CBC: Recent Labs  Lab 08/21/23 0922 08/22/23 0503 08/23/23 0501 08/25/23 0535 08/27/23 0548  WBC 9.4 5.9  --  7.1 8.7  HGB 10.0* 9.0* 9.6* 10.2* 10.3*  HCT 30.5* 26.8*  --  31.8* 33.0*  MCV 79.6* 77.9*  --  79.9* 83.1  PLT 196 184  --  279 289   Cardiac Enzymes: No results for input(s): "CKTOTAL", "CKMB", "CKMBINDEX", "TROPONINI" in the last 168 hours. BNP: Invalid input(s): "POCBNP" CBG: Recent Labs  Lab 08/21/23 1145 08/21/23 1545 08/22/23 0454 08/22/23 1608 08/24/23 1727  GLUCAP 99 83 85 72 92   D-Dimer No results for input(s): "DDIMER" in the last 72 hours. Hgb A1c No results for input(s): "HGBA1C" in the last 72 hours. Lipid Profile No results for input(s): "CHOL", "HDL", "LDLCALC", "TRIG", "CHOLHDL", "LDLDIRECT" in the last 72 hours. Thyroid function studies No results for input(s): "TSH", "T4TOTAL", "T3FREE", "THYROIDAB" in the last 72 hours.  Invalid input(s): "FREET3" Anemia work up No results for input(s): "VITAMINB12", "FOLATE", "FERRITIN", "TIBC", "IRON", "RETICCTPCT" in the last 72 hours. Urinalysis    Component Value Date/Time   COLORURINE AMBER (A) 08/18/2023 1910   APPEARANCEUR HAZY (A) 08/18/2023 1910   LABSPEC 1.020 08/18/2023 1910   PHURINE 5.0 08/18/2023 1910   GLUCOSEU NEGATIVE 08/18/2023 1910   HGBUR SMALL (A) 08/18/2023 1910   BILIRUBINUR NEGATIVE 08/18/2023 1910   BILIRUBINUR negative 04/07/2019 1643   KETONESUR NEGATIVE 08/18/2023 1910   PROTEINUR NEGATIVE 08/18/2023 1910   UROBILINOGEN 0.2 04/07/2019 1643   NITRITE NEGATIVE 08/18/2023 1910   LEUKOCYTESUR NEGATIVE 08/18/2023 1910   Sepsis Labs Recent Labs  Lab 08/21/23 0922 08/22/23 0503 08/25/23 0535 08/27/23 0548  WBC 9.4 5.9 7.1 8.7   Microbiology Recent Results (from the past 240 hours)  Blood Culture (routine x 2)     Status: None   Collection Time: 08/18/23 12:35 PM   Specimen: BLOOD  Result Value Ref Range Status    Specimen Description   Final    BLOOD RIGHT [facility] Performed at [facility], [address], [place], [place] [address]    Special Requests   Final    BOTTLES DRAWN AEROBIC AND ANAEROBIC Blood Culture results may not be optimal due to an inadequate volume of blood received in culture bottles Performed at [facility], [address], [place], [place] [address]    Culture   Final    NO GROWTH 5 DAYS Performed at [facility], [address], [place], [place] [address]    Report Status 08/23/2023  FINAL  Final  Resp panel by RT-PCR (RSV, Flu A&B, Covid) Anterior Nasal Swab     Status: None   Collection Time: 08/18/23  1:13 PM   Specimen: Anterior Nasal Swab  Result Value Ref Range Status   SARS Coronavirus 2 by RT PCR NEGATIVE NEGATIVE Final    Comment: (NOTE) SARS-CoV-2 target nucleic acids are NOT DETECTED.  The SARS-CoV-2 RNA is generally detectable in upper respiratory specimens during the acute phase of infection. The lowest concentration of SARS-CoV-2 viral copies this assay can detect is 138 copies/mL. A negative result does not preclude SARS-Cov-2 infection and should not be used as the sole basis for treatment or other patient management decisions. A negative result may occur with  improper specimen collection/handling, submission of specimen other than nasopharyngeal swab, presence of viral mutation(s) within the areas targeted by this assay, and inadequate number of viral copies(<138 copies/mL). A negative result must be combined with clinical observations, patient history, and epidemiological information. The expected result is Negative.  Fact Sheet for Patients:  BloggerCourse.com  Fact Sheet for Healthcare Providers:  SeriousBroker.it  This test is no t yet approved or cleared by the [place] FDA and  has been authorized for detection and/or diagnosis of SARS-CoV-2 by FDA under an  Emergency Use Authorization (EUA). This EUA will remain  in effect (meaning this test can be used) for the duration of the COVID-19 declaration under Section 564(b)(1) of the Act, 21 U.S.C.section 360bbb-3(b)(1), unless the authorization is terminated  or revoked sooner.       Influenza A by PCR NEGATIVE NEGATIVE Final   Influenza B by PCR NEGATIVE NEGATIVE Final    Comment: (NOTE) The Xpert Xpress SARS-CoV-2/FLU/RSV plus assay is intended as an aid in the diagnosis of influenza from Nasopharyngeal swab specimens and should not be used as a sole basis for treatment. Nasal washings and aspirates are unacceptable for Xpert Xpress SARS-CoV-2/FLU/RSV testing.  Fact Sheet for Patients: BloggerCourse.com  Fact Sheet for Healthcare Providers: SeriousBroker.it  This test is not yet approved or cleared by the [place] FDA and has been authorized for detection and/or diagnosis of SARS-CoV-2 by FDA under an Emergency Use Authorization (EUA). This EUA will remain in effect (meaning this test can be used) for the duration of the COVID-19 declaration under Section 564(b)(1) of the Act, 21 U.S.C. section 360bbb-3(b)(1), unless the authorization is terminated or revoked.     Resp Syncytial Virus by PCR NEGATIVE NEGATIVE Final    Comment: (NOTE) Fact Sheet for Patients: BloggerCourse.com  Fact Sheet for Healthcare Providers: SeriousBroker.it  This test is not yet approved or cleared by the [place] FDA and has been authorized for detection and/or diagnosis of SARS-CoV-2 by FDA under an Emergency Use Authorization (EUA). This EUA will remain in effect (meaning this test can be used) for the duration of the COVID-19 declaration under Section 564(b)(1) of the Act, 21 U.S.C. section 360bbb-3(b)(1), unless the authorization is terminated or revoked.  Performed at [facility], [address] [place], [place] [address]   Blood Culture (routine x 2)     Status: None   Collection Time: 08/18/23  1:13 PM   Specimen: BLOOD  Result Value Ref Range Status   Specimen Description   Final    BLOOD BLOOD LEFT HAND Performed at [facility], [address], [place], [place] [address]    Special Requests   Final    BOTTLES DRAWN AEROBIC AND ANAEROBIC Blood Culture adequate volume Performed  at [facility], [address] [place], [place] [address]    Culture   Final    NO GROWTH 5 DAYS Performed at [facility], [address] [place], [place] [address]    Report Status 08/23/2023 FINAL  Final  MRSA Next Gen by PCR, Nasal     Status: None   Collection Time: 08/18/23  5:57 PM   Specimen: Nasal Mucosa; Nasal Swab  Result Value Ref Range Status   MRSA by PCR Next Gen NOT DETECTED NOT DETECTED Final    Comment: (NOTE) The GeneXpert MRSA Assay (FDA approved for NASAL specimens only), is one component of a comprehensive MRSA colonization surveillance program. It is not intended to diagnose MRSA infection nor to guide or monitor treatment for MRSA infections. Test performance is not FDA approved in patients less than 55 years old. Performed at [facility], [address], [place], [place] [id number]    Imaging US Venous Img Lower Unilateral Left (DVT) Result Date: 08/19/2023 CLINICAL DATA:  10026 Shortness of breath 10026. EXAM: Left LOWER EXTREMITY VENOUS DOPPLER ULTRASOUND TECHNIQUE: Gray-scale sonography with compression, as well as color and duplex ultrasound, were performed to evaluate the deep venous system(s) from the level of the common femoral vein through the popliteal and proximal calf veins. COMPARISON:  Ultrasound bilateral lower extremity venous Doppler 07/01/2022 FINDINGS: VENOUS Normal compressibility of the common femoral, superficial femoral, and popliteal veins, as well as the visualized calf veins.  Visualized portions of profunda femoral vein and great saphenous vein unremarkable. No filling defects to suggest DVT on grayscale or color Doppler imaging. Doppler waveforms show normal direction of venous flow, normal respiratory plasticity and response to augmentation. OTHER None. Limitations: none IMPRESSION: Negative. Electronically Signed   By: Tish Frederickson M.D.   On: 08/19/2023 20:22   DG Abd 1 View Result Date: 08/18/2023 CLINICAL DATA:  NG tube placement EXAM: ABDOMEN - 1 VIEW COMPARISON:  CT 08/18/2023 FINDINGS: Enteric tube tip at the mid gastric region, side-port near GE junction. Air distension of bowel in the left upper quadrant IMPRESSION: Enteric tube tip at the mid gastric region, side-port near GE junction, further advancement could be considered for more optimal positioning. Electronically Signed   By: Jasmine Pang M.D.   On: 08/18/2023 21:01   CT ABDOMEN PELVIS WO CONTRAST Result Date: 08/18/2023 CLINICAL DATA:  Sepsis. Nausea and vomiting. Hypotension. One-week history of shortness of breath, weakness, constipation, abdominal pain, nausea, vomiting, and diarrhea. History of prostate cancer. EXAM: CT ABDOMEN AND PELVIS WITHOUT CONTRAST TECHNIQUE: Multidetector CT imaging of the abdomen and pelvis was performed following the standard protocol without IV contrast. RADIATION DOSE REDUCTION: This exam was performed according to the departmental dose-optimization program which includes automated exposure control, adjustment of the mA and/or kV according to patient size and/or use of iterative reconstruction technique. COMPARISON:  07/04/2023 FINDINGS: Lower chest: Dense airspace consolidation in both lung bases, new since prior study. This is likely to represent multifocal pneumonia. Aspiration would be a secondary consideration. The esophagus is fluid-filled with mild distention. This may be due to reflux or dysmotility. Hepatobiliary: Focal liver lesion in segment 6 measuring 2.5 cm  diameter. This is better characterized on the previous contrast-enhanced study, likely representing a cyst. Increased density in the gallbladder could represent stones or sludge. No gallbladder wall thickening. No bile duct dilatation. Pancreas: Unremarkable. No pancreatic ductal dilatation or surrounding inflammatory changes. Spleen: Normal in size without focal abnormality. Adrenals/Urinary Tract: Adrenal glands are unremarkable.  Kidneys are normal, without renal calculi, focal lesion, or hydronephrosis. Bladder is unremarkable. Stomach/Bowel: Stomach is distended with fluid. Suggestion of mild gastric wall thickening possibly gastritis. Small bowel are mostly decompressed with a few scattered gas-filled small bowel loops. Anastomosis in the anterior mid abdomen likely represents an ileocolic anastomosis. The colon is mildly distended with gas. There is mild gastric wall thickening which may indicate colitis. Vascular/Lymphatic: Calcification of the aorta. Scattered lymph nodes in the porta hepatis and gastrohepatic ligament are not pathologically enlarged, likely reactive. Reproductive: No pelvic mass or lymphadenopathy. Other: Loculated fluid collection in the right lower quadrant measuring up to about 5 x 10.1 cm. Collection appears roughly similar to the prior study. Decreased collection in the left pericolic gutter region since prior study. Decreased free fluid in the abdomen and pelvis. No free air identified. Small right inguinal hernia containing fat. Calcification and scarring in the anterior abdominal wall is likely postoperative Musculoskeletal: Degenerative changes in the spine and hips. No acute bony abnormalities. IMPRESSION: 1. New development of dense consolidation in both lung bases likely representing multifocal pneumonia or possibly aspiration. 2. Stomach is distended with fluid. Gastric wall thickening suggesting gastritis. Fluid-filled lower esophagus may indicate reflux disease or  esophagitis. 3. Gaseous distention of colon is progressing since prior study. Colonic wall thickening may indicate colitis. 4. Loculated fluid collection in the right lower quadrant is similar to prior study, possibly an abscess. Free fluid and left pericolic gutter fluid is decreasing since prior study. 5. Aortic atherosclerosis. Electronically Signed   By: Burman Nieves M.D.   On: 08/18/2023 18:18   DG Chest Portable 1 View Result Date: 08/18/2023 CLINICAL DATA:  63 year old male with possible sepsis. Weakness, shortness of breath, abdominal pain. History of prostate cancer. Central line placement. EXAM: PORTABLE CHEST 1 VIEW COMPARISON:  Portable chest 1257 hours today. FINDINGS: Portable AP semi upright view at 1510 hours. New left IJ approach central line in place, tip projects at the central line placement. Innominate vein confluence, upper SVC. Stable pre-existing right chest power port. No pneumothorax. Ongoing abnormal confluent right lung base opacity, and on this image the right hilum also appears abnormally enlarged, opacified. Lung markings and mediastinal contours elsewhere within normal limits. No pleural effusion. Stable visualized osseous structures. Negative visible bowel gas. IMPRESSION: 1. Left IJ central line placed, tip at the innominate vein confluence, upper SVC. No pneumothorax. 2. Abnormal right perihilar and right lung base confluent opacity suspicious for pneumonia, aspiration in this setting. No pleural effusion. Electronically Signed   By: Odessa Fleming M.D.   On: 08/18/2023 15:24   DG Chest Port 1 View Result Date: 08/18/2023 CLINICAL DATA:  63 year old male with possible sepsis. Weakness, shortness of breath, abdominal pain. History of prostate cancer. EXAM: PORTABLE CHEST 1 VIEW COMPARISON:  Portable chest 10/02/2022 and earlier. FINDINGS: Portable AP semi upright view at 1257 hours. Similar patient rotation to the right. Chronic right chest Port-A-Cath. Stable lung volumes and  mediastinal contours. No pneumothorax, pulmonary edema, pleural effusion. But new patchy, reticulonodular right lung base opacity since last year. Similar bilateral upper lung predominant opacities at that time appear resolved. Small surgical clips at the right thoracic inlet. No acute osseous abnormality identified. IMPRESSION: Patchy right lung base opacity since last year suspicious for aspiration or pneumonia in this setting. No pleural effusion. Electronically Signed   By: Odessa Fleming M.D.   On: 08/18/2023 15:22      Time coordinating discharge: over 30 minutes  SIGNED:  Sunnie Nielsen  DO [facility]

## 2023-08-28 DIAGNOSIS — I10 Essential (primary) hypertension: Secondary | ICD-10-CM | POA: Diagnosis not present

## 2023-08-28 DIAGNOSIS — E039 Hypothyroidism, unspecified: Secondary | ICD-10-CM | POA: Diagnosis not present

## 2023-08-28 DIAGNOSIS — C786 Secondary malignant neoplasm of retroperitoneum and peritoneum: Secondary | ICD-10-CM | POA: Diagnosis not present

## 2023-08-28 DIAGNOSIS — C760 Malignant neoplasm of head, face and neck: Secondary | ICD-10-CM | POA: Diagnosis not present

## 2023-08-28 DIAGNOSIS — Z515 Encounter for palliative care: Secondary | ICD-10-CM | POA: Diagnosis not present

## 2023-08-28 DIAGNOSIS — I6930 Unspecified sequelae of cerebral infarction: Secondary | ICD-10-CM | POA: Diagnosis not present

## 2023-08-29 DIAGNOSIS — C786 Secondary malignant neoplasm of retroperitoneum and peritoneum: Secondary | ICD-10-CM | POA: Diagnosis not present

## 2023-08-29 DIAGNOSIS — I10 Essential (primary) hypertension: Secondary | ICD-10-CM | POA: Diagnosis not present

## 2023-08-29 DIAGNOSIS — E039 Hypothyroidism, unspecified: Secondary | ICD-10-CM | POA: Diagnosis not present

## 2023-08-29 DIAGNOSIS — Z515 Encounter for palliative care: Secondary | ICD-10-CM | POA: Diagnosis not present

## 2023-08-29 DIAGNOSIS — C760 Malignant neoplasm of head, face and neck: Secondary | ICD-10-CM | POA: Diagnosis not present

## 2023-08-29 DIAGNOSIS — I6930 Unspecified sequelae of cerebral infarction: Secondary | ICD-10-CM | POA: Diagnosis not present

## 2023-08-30 ENCOUNTER — Telehealth: Payer: Self-pay

## 2023-08-30 DIAGNOSIS — C760 Malignant neoplasm of head, face and neck: Secondary | ICD-10-CM | POA: Diagnosis not present

## 2023-08-30 DIAGNOSIS — Z515 Encounter for palliative care: Secondary | ICD-10-CM | POA: Diagnosis not present

## 2023-08-30 DIAGNOSIS — E039 Hypothyroidism, unspecified: Secondary | ICD-10-CM | POA: Diagnosis not present

## 2023-08-30 DIAGNOSIS — I6930 Unspecified sequelae of cerebral infarction: Secondary | ICD-10-CM | POA: Diagnosis not present

## 2023-08-30 DIAGNOSIS — I10 Essential (primary) hypertension: Secondary | ICD-10-CM | POA: Diagnosis not present

## 2023-08-30 DIAGNOSIS — C786 Secondary malignant neoplasm of retroperitoneum and peritoneum: Secondary | ICD-10-CM | POA: Diagnosis not present

## 2023-08-30 NOTE — Transitions of Care (Post Inpatient/ED Visit) (Signed)
   08/30/2023  Name: Patrick Bailey. MRN: [id number] DOB: [birth year]  Today's TOC FU Call Status: Today's TOC FU Call Status:: Unsuccessful Call (1st Attempt) Unsuccessful Call (1st Attempt) Date: 08/30/23  Attempted to reach the patient regarding the most recent Inpatient/ED visit.  Follow Up Plan: Additional outreach attempts will be made to reach the patient to complete the Transitions of Care (Post Inpatient/ED visit) call.   Deidre Ala, BSN, RN [place]  [facility] - [employer] [facility] RN Care Manager [phone]

## 2023-08-31 ENCOUNTER — Ambulatory Visit: Payer: BC Managed Care – PPO | Admitting: Family Medicine

## 2023-08-31 ENCOUNTER — Encounter: Payer: Self-pay | Admitting: General Practice

## 2023-08-31 ENCOUNTER — Telehealth: Payer: Self-pay | Admitting: *Deleted

## 2023-08-31 ENCOUNTER — Ambulatory Visit (INDEPENDENT_AMBULATORY_CARE_PROVIDER_SITE_OTHER): Payer: BC Managed Care – PPO | Admitting: General Practice

## 2023-08-31 VITALS — BP 124/68 | HR 101 | Temp 97.4°F | Ht 72.0 in | Wt 155.0 lb

## 2023-08-31 DIAGNOSIS — Z515 Encounter for palliative care: Secondary | ICD-10-CM | POA: Diagnosis not present

## 2023-08-31 DIAGNOSIS — I6930 Unspecified sequelae of cerebral infarction: Secondary | ICD-10-CM | POA: Diagnosis not present

## 2023-08-31 DIAGNOSIS — H6123 Impacted cerumen, bilateral: Secondary | ICD-10-CM

## 2023-08-31 DIAGNOSIS — E039 Hypothyroidism, unspecified: Secondary | ICD-10-CM | POA: Diagnosis not present

## 2023-08-31 DIAGNOSIS — I10 Essential (primary) hypertension: Secondary | ICD-10-CM | POA: Diagnosis not present

## 2023-08-31 DIAGNOSIS — C786 Secondary malignant neoplasm of retroperitoneum and peritoneum: Secondary | ICD-10-CM | POA: Diagnosis not present

## 2023-08-31 DIAGNOSIS — C760 Malignant neoplasm of head, face and neck: Secondary | ICD-10-CM | POA: Diagnosis not present

## 2023-08-31 NOTE — Patient Instructions (Signed)
It was nice meeting you.   If you develop any new symptoms, please schedule follow up with PCP.

## 2023-08-31 NOTE — Assessment & Plan Note (Signed)
Bilateral cerumen impaction identified on exam. Patient consented to irrigation of canals bilaterally.  Bilateral canals irrigated. Patient tolerated well. TM's and canals post irrigation unremarkable.   Discussed home care instructions.

## 2023-08-31 NOTE — Transitions of Care (Post Inpatient/ED Visit) (Signed)
   08/31/2023  Name: Patrick Bailey. MRN: [id number] DOB: [birth year]  Today's TOC FU Call Status: Today's TOC FU Call Status:: Unsuccessful Call (2nd Attempt) Unsuccessful Call (2nd Attempt) Date: 08/31/23  Attempted to reach the patient regarding the most recent Inpatient/ED visit.  Follow Up Plan: Additional outreach attempts will be made to reach the patient to complete the Transitions of Care (Post Inpatient/ED visit) call.   Gean Maidens BSN RN [facility] Health Care Management Coordinator [email or web address] Direct Dial: [phone]  Fax: [phone] Website: [email or web address]
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
## 2023-09-01 ENCOUNTER — Telehealth: Payer: Self-pay

## 2023-09-01 DIAGNOSIS — C760 Malignant neoplasm of head, face and neck: Secondary | ICD-10-CM | POA: Diagnosis not present

## 2023-09-01 DIAGNOSIS — I10 Essential (primary) hypertension: Secondary | ICD-10-CM | POA: Diagnosis not present

## 2023-09-01 DIAGNOSIS — Z515 Encounter for palliative care: Secondary | ICD-10-CM | POA: Diagnosis not present

## 2023-09-01 DIAGNOSIS — I6930 Unspecified sequelae of cerebral infarction: Secondary | ICD-10-CM | POA: Diagnosis not present

## 2023-09-01 DIAGNOSIS — C786 Secondary malignant neoplasm of retroperitoneum and peritoneum: Secondary | ICD-10-CM | POA: Diagnosis not present

## 2023-09-01 DIAGNOSIS — E039 Hypothyroidism, unspecified: Secondary | ICD-10-CM | POA: Diagnosis not present

## 2023-09-01 NOTE — Transitions of Care (Post Inpatient/ED Visit) (Signed)
   09/01/2023  Name: Patrick Bailey. MRN: [id number] DOB: 09-20-1960  Today's TOC FU Call Status: Today's TOC FU Call Status:: Unsuccessful Call (3rd Attempt) Unsuccessful Call (3rd Attempt) Date: 09/01/23  Attempted to reach the patient regarding the most recent Inpatient/ED visit.  Follow Up Plan: No further outreach attempts will be made at this time. We have been unable to contact the patient.  Jodelle Gross RN, BSN, CCM [place]  [facility] Manager Population Health Direct Dial: [phone]  Fax: [phone]

## 2023-09-02 DIAGNOSIS — I6930 Unspecified sequelae of cerebral infarction: Secondary | ICD-10-CM | POA: Diagnosis not present

## 2023-09-02 DIAGNOSIS — Z515 Encounter for palliative care: Secondary | ICD-10-CM | POA: Diagnosis not present

## 2023-09-02 DIAGNOSIS — E039 Hypothyroidism, unspecified: Secondary | ICD-10-CM | POA: Diagnosis not present

## 2023-09-02 DIAGNOSIS — C760 Malignant neoplasm of head, face and neck: Secondary | ICD-10-CM | POA: Diagnosis not present

## 2023-09-02 DIAGNOSIS — C786 Secondary malignant neoplasm of retroperitoneum and peritoneum: Secondary | ICD-10-CM | POA: Diagnosis not present

## 2023-09-02 DIAGNOSIS — I10 Essential (primary) hypertension: Secondary | ICD-10-CM | POA: Diagnosis not present

## 2023-09-03 DIAGNOSIS — I6930 Unspecified sequelae of cerebral infarction: Secondary | ICD-10-CM | POA: Diagnosis not present

## 2023-09-03 DIAGNOSIS — E039 Hypothyroidism, unspecified: Secondary | ICD-10-CM | POA: Diagnosis not present

## 2023-09-03 DIAGNOSIS — C760 Malignant neoplasm of head, face and neck: Secondary | ICD-10-CM | POA: Diagnosis not present

## 2023-09-03 DIAGNOSIS — Z515 Encounter for palliative care: Secondary | ICD-10-CM | POA: Diagnosis not present

## 2023-09-03 DIAGNOSIS — C786 Secondary malignant neoplasm of retroperitoneum and peritoneum: Secondary | ICD-10-CM | POA: Diagnosis not present

## 2023-09-03 DIAGNOSIS — I10 Essential (primary) hypertension: Secondary | ICD-10-CM | POA: Diagnosis not present

## 2023-09-04 DIAGNOSIS — Z515 Encounter for palliative care: Secondary | ICD-10-CM | POA: Diagnosis not present

## 2023-09-04 DIAGNOSIS — C786 Secondary malignant neoplasm of retroperitoneum and peritoneum: Secondary | ICD-10-CM | POA: Diagnosis not present

## 2023-09-04 DIAGNOSIS — I6930 Unspecified sequelae of cerebral infarction: Secondary | ICD-10-CM | POA: Diagnosis not present

## 2023-09-04 DIAGNOSIS — C760 Malignant neoplasm of head, face and neck: Secondary | ICD-10-CM | POA: Diagnosis not present

## 2023-09-04 DIAGNOSIS — E039 Hypothyroidism, unspecified: Secondary | ICD-10-CM | POA: Diagnosis not present

## 2023-09-04 DIAGNOSIS — I10 Essential (primary) hypertension: Secondary | ICD-10-CM | POA: Diagnosis not present

## 2023-09-05 DIAGNOSIS — I10 Essential (primary) hypertension: Secondary | ICD-10-CM | POA: Diagnosis not present

## 2023-09-05 DIAGNOSIS — C760 Malignant neoplasm of head, face and neck: Secondary | ICD-10-CM | POA: Diagnosis not present

## 2023-09-05 DIAGNOSIS — C786 Secondary malignant neoplasm of retroperitoneum and peritoneum: Secondary | ICD-10-CM | POA: Diagnosis not present

## 2023-09-05 DIAGNOSIS — Z515 Encounter for palliative care: Secondary | ICD-10-CM | POA: Diagnosis not present

## 2023-09-05 DIAGNOSIS — I6930 Unspecified sequelae of cerebral infarction: Secondary | ICD-10-CM | POA: Diagnosis not present

## 2023-09-05 DIAGNOSIS — E039 Hypothyroidism, unspecified: Secondary | ICD-10-CM | POA: Diagnosis not present

## 2023-09-06 DIAGNOSIS — Z515 Encounter for palliative care: Secondary | ICD-10-CM | POA: Diagnosis not present

## 2023-09-06 DIAGNOSIS — I6930 Unspecified sequelae of cerebral infarction: Secondary | ICD-10-CM | POA: Diagnosis not present

## 2023-09-06 DIAGNOSIS — C760 Malignant neoplasm of head, face and neck: Secondary | ICD-10-CM | POA: Diagnosis not present

## 2023-09-06 DIAGNOSIS — I10 Essential (primary) hypertension: Secondary | ICD-10-CM | POA: Diagnosis not present

## 2023-09-06 DIAGNOSIS — C786 Secondary malignant neoplasm of retroperitoneum and peritoneum: Secondary | ICD-10-CM | POA: Diagnosis not present

## 2023-09-06 DIAGNOSIS — E039 Hypothyroidism, unspecified: Secondary | ICD-10-CM | POA: Diagnosis not present

## 2023-09-07 DIAGNOSIS — C786 Secondary malignant neoplasm of retroperitoneum and peritoneum: Secondary | ICD-10-CM | POA: Diagnosis not present

## 2023-09-07 DIAGNOSIS — I6930 Unspecified sequelae of cerebral infarction: Secondary | ICD-10-CM | POA: Diagnosis not present

## 2023-09-07 DIAGNOSIS — E039 Hypothyroidism, unspecified: Secondary | ICD-10-CM | POA: Diagnosis not present

## 2023-09-07 DIAGNOSIS — I10 Essential (primary) hypertension: Secondary | ICD-10-CM | POA: Diagnosis not present

## 2023-09-07 DIAGNOSIS — Z515 Encounter for palliative care: Secondary | ICD-10-CM | POA: Diagnosis not present

## 2023-09-07 DIAGNOSIS — C760 Malignant neoplasm of head, face and neck: Secondary | ICD-10-CM | POA: Diagnosis not present

## 2023-09-08 DIAGNOSIS — C786 Secondary malignant neoplasm of retroperitoneum and peritoneum: Secondary | ICD-10-CM | POA: Diagnosis not present

## 2023-09-08 DIAGNOSIS — E039 Hypothyroidism, unspecified: Secondary | ICD-10-CM | POA: Diagnosis not present

## 2023-09-08 DIAGNOSIS — C760 Malignant neoplasm of head, face and neck: Secondary | ICD-10-CM | POA: Diagnosis not present

## 2023-09-08 DIAGNOSIS — I10 Essential (primary) hypertension: Secondary | ICD-10-CM | POA: Diagnosis not present

## 2023-09-08 DIAGNOSIS — I6930 Unspecified sequelae of cerebral infarction: Secondary | ICD-10-CM | POA: Diagnosis not present

## 2023-09-08 DIAGNOSIS — Z515 Encounter for palliative care: Secondary | ICD-10-CM | POA: Diagnosis not present

## 2023-09-09 ENCOUNTER — Ambulatory Visit: Payer: Self-pay | Admitting: Family Medicine

## 2023-09-09 DIAGNOSIS — E039 Hypothyroidism, unspecified: Secondary | ICD-10-CM | POA: Diagnosis not present

## 2023-09-09 DIAGNOSIS — I6930 Unspecified sequelae of cerebral infarction: Secondary | ICD-10-CM | POA: Diagnosis not present

## 2023-09-09 DIAGNOSIS — C786 Secondary malignant neoplasm of retroperitoneum and peritoneum: Secondary | ICD-10-CM | POA: Diagnosis not present

## 2023-09-09 DIAGNOSIS — C760 Malignant neoplasm of head, face and neck: Secondary | ICD-10-CM | POA: Diagnosis not present

## 2023-09-09 DIAGNOSIS — I10 Essential (primary) hypertension: Secondary | ICD-10-CM | POA: Diagnosis not present

## 2023-09-09 DIAGNOSIS — Z515 Encounter for palliative care: Secondary | ICD-10-CM | POA: Diagnosis not present

## 2023-09-09 MED ORDER — OFLOXACIN 0.3 % OT SOLN: 5.0000 [drp] | Freq: Every day | OTIC | 0 refills | Status: DC

## 2023-09-09 NOTE — Telephone Encounter (Signed)
 Reason for Disposition . Third attempt to contact caller AND no contact made. Phone number verified.  Answer Assessment - Initial Assessment Questions N/A Unable to reach patient for triage.  Protocols used: No Contact or Duplicate Contact Call-A-AH

## 2023-09-09 NOTE — Telephone Encounter (Signed)
 I can send antibiotic ear drop in case any external otitis after irrigation but he really needs this re evaluated in office if ongoing or not improved.  ERx Oflox otic ear drops to [facility].  Let me know if he agrees to come in and I can fit him in tomorrow - 4:30pm.

## 2023-09-09 NOTE — Telephone Encounter (Signed)
 Chief Complaint: Earache follow-up Symptoms: right ear ringing and echoing  Frequency: constant  Pertinent Negatives: Patient denies drainage from the ear, pain, balance issue Disposition: [] ED /[] Urgent Care (no appt availability in office) / [] Appointment(In office/virtual)/ []  [place] [facility]/ [] Home Care/ [] Refused Recommended Disposition /[] [place] [facility]/ [x]  Follow-up with PCP Additional Notes: Patient stated he was seen on  08/31/23 by the NP in the office for Impacted cerumen of both ears. Patient had both ears irrigated during the visit. Patient states the right ear still feels full and he hears an echo with some ringing in the ear. Patient states it is driving him crazy. Care advice was given and patient was offered a follow appointment. Patient declined and asked that a Rx be sent to his pharmacy. Advised I would forward request to PCP for additional recommendations. Patient verbalized understanding.  Copied from CRM [id number]. Topic: Clinical - Pink Word Triage >> Sep 09, 2023  9:16 AM [place] ORN wrote: Reason for Triage: Patient called states previous seen for ear. Provider flushed it but now it is stopped up again. It is ringing and he can not hear out of it. Would like to see if provider can flush again. States he came to office yesterday but went to the office across the hall and they were unable to do it. Thank You Reason for Disposition  [1] Recent medical visit within 24 hours AND [2] condition / symptoms WORSE  Answer Assessment - Initial Assessment Questions 1. MAIN CONCERN OR SYMPTOM:  What is your main concern right now? What question do you have? What's the main symptom you're worried about? (e.g., breathing difficulty, cough, fever. pain)     Ringing in the right ear  2. ONSET: When did the  ringing  start?     08/30/23 3. BETTER-SAME-WORSE: Are you getting better, staying the same, or getting worse compared to how you felt at your last visit to the  doctor (most recent medical visit)?     Worse  4. VISIT DATE: When were you seen? (Date)     08/31/23 5. VISIT DOCTOR: What is the name of the doctor taking care of you now?     NP  6. VISIT DIAGNOSIS:  What was the main symptom or problem that you were seen for? Were you given a diagnosis?      Wax impaction  7. VISIT MEDICINES: Did the doctor order any new medicines for you to use? If Yes, ask: Have you filled the prescription and started taking the medicine?      None  8. NEXT APPOINTMENT: Have you scheduled a follow-up appointment with your doctor?     No  9. PAIN: Is there any pain? If Yes, ask: How bad is it?  (Scale 0-10; or mild, moderate, severe)    - NONE (0): no pain    - MILD (1-3): doesn't interfere with normal activities     - MODERATE (4-7): interferes with normal activities or awakens from sleep     - SEVERE (8-10): excruciating pain, unable to do any normal activities     No pain  10. FEVER: Do you have a fever? If Yes, ask: What is it, how was it measured  and when did it start?       No  11. OTHER SYMPTOMS: Do you have any other symptoms?       Decreased hearing, echoing  Protocols used: Recent Medical Visit for Illness Follow-up Call-A-AH

## 2023-09-09 NOTE — Telephone Encounter (Signed)
 This RN made second call attempt to triage. No answer, unable to leave a message. Will route appropriately for more attempts.

## 2023-09-09 NOTE — Telephone Encounter (Addendum)
  Chief Complaint: Triage  Disposition: [] ED /[] Urgent Care (no appt availability in office) / [] Appointment(In office/virtual)/ []  [place] [facility]/ [] Home Care/ [] Refused Recommended Disposition /[] [facility]/ [x]  Follow-up with PCP Additional Notes: Unable to reach patient for triage, forwarding to clinic for review and f/u.   This RN 3rd attempt to contact patient for triage, unable to leave voicemail, forwarding to the clinic for f/u.  This RN first attempt to contact patient for triage. Unable to leave voicemail.  Copied from CRM [id number]. Topic: Clinical - Pink Word Triage >> Sep 09, 2023  9:16 AM Graeme ORN wrote: Reason for Triage: Patient called states previous seen for ear. Provider flushed it but now it is stopped up again. It is ringing and he can not hear out of it. Would like to see if provider can flush again. States he came to office yesterday but went to the office across the hall and they were unable to do it. Thank You

## 2023-09-09 NOTE — Telephone Encounter (Signed)
 Called patient to let him know Dr. KANDICE is not in office today. Advised he will need to be seen. No appointments open at our office or [place]. Patient declined to go to any other clinic. Scheduled for 920 with [facility] tomorrow. Advised patient he could go to urgent care he declined. Reviewed all red words with patient and if any he will go to urgent care or ED.   After disconnecting call I seen where Dr. Rilla had responded while I was on the phone. Advised him of the message. He will have father go pick up meds now. He wanted to keep appointment with [facility] do to time. He will reach out if any further questions.

## 2023-09-10 ENCOUNTER — Emergency Department: Payer: BC Managed Care – PPO

## 2023-09-10 ENCOUNTER — Encounter: Payer: Self-pay | Admitting: Anesthesiology

## 2023-09-10 ENCOUNTER — Other Ambulatory Visit: Payer: Self-pay

## 2023-09-10 ENCOUNTER — Inpatient Hospital Stay
Admission: EM | Admit: 2023-09-10 | Discharge: 2023-09-14 | DRG: 374 | Disposition: A | Discharge status: Hospice | Payer: BC Managed Care – PPO | Attending: Internal Medicine | Admitting: Internal Medicine

## 2023-09-10 ENCOUNTER — Ambulatory Visit: Payer: BC Managed Care – PPO | Admitting: Nurse Practitioner

## 2023-09-10 DIAGNOSIS — Z1152 Encounter for screening for COVID-19: Secondary | ICD-10-CM

## 2023-09-10 DIAGNOSIS — E876 Hypokalemia: Secondary | ICD-10-CM | POA: Diagnosis present

## 2023-09-10 DIAGNOSIS — R109 Unspecified abdominal pain: Secondary | ICD-10-CM | POA: Diagnosis present

## 2023-09-10 DIAGNOSIS — F1010 Alcohol abuse, uncomplicated: Secondary | ICD-10-CM | POA: Diagnosis present

## 2023-09-10 DIAGNOSIS — Z8619 Personal history of other infectious and parasitic diseases: Secondary | ICD-10-CM

## 2023-09-10 DIAGNOSIS — Z79890 Hormone replacement therapy (postmenopausal): Secondary | ICD-10-CM

## 2023-09-10 DIAGNOSIS — I6930 Unspecified sequelae of cerebral infarction: Secondary | ICD-10-CM | POA: Diagnosis not present

## 2023-09-10 DIAGNOSIS — Z66 Do not resuscitate: Secondary | ICD-10-CM | POA: Diagnosis present

## 2023-09-10 DIAGNOSIS — D62 Acute posthemorrhagic anemia: Secondary | ICD-10-CM | POA: Diagnosis present

## 2023-09-10 DIAGNOSIS — Z8249 Family history of ischemic heart disease and other diseases of the circulatory system: Secondary | ICD-10-CM

## 2023-09-10 DIAGNOSIS — K56699 Other intestinal obstruction unspecified as to partial versus complete obstruction: Secondary | ICD-10-CM | POA: Diagnosis present

## 2023-09-10 DIAGNOSIS — R627 Adult failure to thrive: Secondary | ICD-10-CM | POA: Diagnosis not present

## 2023-09-10 DIAGNOSIS — K311 Adult hypertrophic pyloric stenosis: Secondary | ICD-10-CM | POA: Diagnosis present

## 2023-09-10 DIAGNOSIS — K922 Gastrointestinal hemorrhage, unspecified: Secondary | ICD-10-CM | POA: Diagnosis not present

## 2023-09-10 DIAGNOSIS — Z7982 Long term (current) use of aspirin: Secondary | ICD-10-CM

## 2023-09-10 DIAGNOSIS — R64 Cachexia: Secondary | ICD-10-CM | POA: Diagnosis present

## 2023-09-10 DIAGNOSIS — K2101 Gastro-esophageal reflux disease with esophagitis, with bleeding: Secondary | ICD-10-CM | POA: Diagnosis not present

## 2023-09-10 DIAGNOSIS — R918 Other nonspecific abnormal finding of lung field: Secondary | ICD-10-CM | POA: Diagnosis not present

## 2023-09-10 DIAGNOSIS — R54 Age-related physical debility: Secondary | ICD-10-CM | POA: Diagnosis present

## 2023-09-10 DIAGNOSIS — K920 Hematemesis: Secondary | ICD-10-CM

## 2023-09-10 DIAGNOSIS — Z85819 Personal history of malignant neoplasm of unspecified site of lip, oral cavity, and pharynx: Secondary | ICD-10-CM

## 2023-09-10 DIAGNOSIS — C760 Malignant neoplasm of head, face and neck: Secondary | ICD-10-CM | POA: Diagnosis not present

## 2023-09-10 DIAGNOSIS — E039 Hypothyroidism, unspecified: Secondary | ICD-10-CM | POA: Diagnosis present

## 2023-09-10 DIAGNOSIS — I959 Hypotension, unspecified: Secondary | ICD-10-CM | POA: Diagnosis not present

## 2023-09-10 DIAGNOSIS — F419 Anxiety disorder, unspecified: Secondary | ICD-10-CM | POA: Diagnosis present

## 2023-09-10 DIAGNOSIS — Z5320 Procedure and treatment not carried out because of patient's decision for unspecified reasons: Secondary | ICD-10-CM | POA: Diagnosis not present

## 2023-09-10 DIAGNOSIS — E871 Hypo-osmolality and hyponatremia: Secondary | ICD-10-CM | POA: Diagnosis present

## 2023-09-10 DIAGNOSIS — D500 Iron deficiency anemia secondary to blood loss (chronic): Secondary | ICD-10-CM | POA: Diagnosis not present

## 2023-09-10 DIAGNOSIS — I2510 Atherosclerotic heart disease of native coronary artery without angina pectoris: Secondary | ICD-10-CM | POA: Diagnosis present

## 2023-09-10 DIAGNOSIS — Z96653 Presence of artificial knee joint, bilateral: Secondary | ICD-10-CM | POA: Diagnosis present

## 2023-09-10 DIAGNOSIS — M109 Gout, unspecified: Secondary | ICD-10-CM | POA: Diagnosis present

## 2023-09-10 DIAGNOSIS — E43 Unspecified severe protein-calorie malnutrition: Secondary | ICD-10-CM | POA: Diagnosis not present

## 2023-09-10 DIAGNOSIS — D649 Anemia, unspecified: Principal | ICD-10-CM

## 2023-09-10 DIAGNOSIS — Z8673 Personal history of transient ischemic attack (TIA), and cerebral infarction without residual deficits: Secondary | ICD-10-CM

## 2023-09-10 DIAGNOSIS — M1990 Unspecified osteoarthritis, unspecified site: Secondary | ICD-10-CM | POA: Diagnosis present

## 2023-09-10 DIAGNOSIS — E781 Pure hyperglyceridemia: Secondary | ICD-10-CM | POA: Diagnosis present

## 2023-09-10 DIAGNOSIS — Z87891 Personal history of nicotine dependence: Secondary | ICD-10-CM

## 2023-09-10 DIAGNOSIS — J449 Chronic obstructive pulmonary disease, unspecified: Secondary | ICD-10-CM | POA: Diagnosis not present

## 2023-09-10 DIAGNOSIS — D849 Immunodeficiency, unspecified: Secondary | ICD-10-CM | POA: Diagnosis present

## 2023-09-10 DIAGNOSIS — K219 Gastro-esophageal reflux disease without esophagitis: Secondary | ICD-10-CM | POA: Diagnosis present

## 2023-09-10 DIAGNOSIS — Z515 Encounter for palliative care: Secondary | ICD-10-CM | POA: Diagnosis not present

## 2023-09-10 DIAGNOSIS — F32A Depression, unspecified: Secondary | ICD-10-CM | POA: Diagnosis present

## 2023-09-10 DIAGNOSIS — K7689 Other specified diseases of liver: Secondary | ICD-10-CM | POA: Diagnosis not present

## 2023-09-10 DIAGNOSIS — R1013 Epigastric pain: Secondary | ICD-10-CM | POA: Diagnosis not present

## 2023-09-10 DIAGNOSIS — K56609 Unspecified intestinal obstruction, unspecified as to partial versus complete obstruction: Secondary | ICD-10-CM | POA: Insufficient documentation

## 2023-09-10 DIAGNOSIS — F418 Other specified anxiety disorders: Secondary | ICD-10-CM | POA: Diagnosis present

## 2023-09-10 DIAGNOSIS — Z9981 Dependence on supplemental oxygen: Secondary | ICD-10-CM

## 2023-09-10 DIAGNOSIS — R140 Abdominal distension (gaseous): Secondary | ICD-10-CM | POA: Diagnosis not present

## 2023-09-10 DIAGNOSIS — Z6821 Body mass index (BMI) 21.0-21.9, adult: Secondary | ICD-10-CM

## 2023-09-10 DIAGNOSIS — Z7952 Long term (current) use of systemic steroids: Secondary | ICD-10-CM

## 2023-09-10 DIAGNOSIS — Z8701 Personal history of pneumonia (recurrent): Secondary | ICD-10-CM

## 2023-09-10 DIAGNOSIS — E785 Hyperlipidemia, unspecified: Secondary | ICD-10-CM | POA: Diagnosis present

## 2023-09-10 DIAGNOSIS — Z7902 Long term (current) use of antithrombotics/antiplatelets: Secondary | ICD-10-CM

## 2023-09-10 DIAGNOSIS — R0902 Hypoxemia: Secondary | ICD-10-CM | POA: Diagnosis present

## 2023-09-10 DIAGNOSIS — Z885 Allergy status to narcotic agent status: Secondary | ICD-10-CM

## 2023-09-10 DIAGNOSIS — K769 Liver disease, unspecified: Secondary | ICD-10-CM | POA: Diagnosis not present

## 2023-09-10 DIAGNOSIS — I10 Essential (primary) hypertension: Secondary | ICD-10-CM | POA: Diagnosis present

## 2023-09-10 DIAGNOSIS — R000 Tachycardia, unspecified: Secondary | ICD-10-CM | POA: Diagnosis present

## 2023-09-10 DIAGNOSIS — N400 Enlarged prostate without lower urinary tract symptoms: Secondary | ICD-10-CM | POA: Diagnosis present

## 2023-09-10 DIAGNOSIS — C181 Malignant neoplasm of appendix: Secondary | ICD-10-CM | POA: Diagnosis not present

## 2023-09-10 DIAGNOSIS — Z79899 Other long term (current) drug therapy: Secondary | ICD-10-CM

## 2023-09-10 DIAGNOSIS — K627 Radiation proctitis: Secondary | ICD-10-CM | POA: Diagnosis not present

## 2023-09-10 DIAGNOSIS — C786 Secondary malignant neoplasm of retroperitoneum and peritoneum: Principal | ICD-10-CM | POA: Diagnosis present

## 2023-09-10 DIAGNOSIS — R21 Rash and other nonspecific skin eruption: Secondary | ICD-10-CM | POA: Diagnosis not present

## 2023-09-10 DIAGNOSIS — Z4682 Encounter for fitting and adjustment of non-vascular catheter: Secondary | ICD-10-CM | POA: Diagnosis not present

## 2023-09-10 DIAGNOSIS — Z7189 Other specified counseling: Secondary | ICD-10-CM | POA: Diagnosis not present

## 2023-09-10 DIAGNOSIS — E162 Hypoglycemia, unspecified: Secondary | ICD-10-CM | POA: Diagnosis present

## 2023-09-10 DIAGNOSIS — K6389 Other specified diseases of intestine: Secondary | ICD-10-CM | POA: Diagnosis not present

## 2023-09-10 DIAGNOSIS — R944 Abnormal results of kidney function studies: Secondary | ICD-10-CM | POA: Diagnosis present

## 2023-09-10 DIAGNOSIS — Z9049 Acquired absence of other specified parts of digestive tract: Secondary | ICD-10-CM

## 2023-09-10 LAB — COMPREHENSIVE METABOLIC PANEL
ALT: 11 U/L (ref 0–44)
AST: 19 U/L (ref 15–41)
Albumin: 2.7 g/dL — ABNORMAL LOW (ref 3.5–5.0)
Alkaline Phosphatase: 56 U/L (ref 38–126)
Anion gap: 16 — ABNORMAL HIGH (ref 5–15)
BUN: 29 mg/dL — ABNORMAL HIGH (ref 8–23)
CO2: 26 mmol/L (ref 22–32)
Calcium: 8.1 mg/dL — ABNORMAL LOW (ref 8.9–10.3)
Chloride: 93 mmol/L — ABNORMAL LOW (ref 98–111)
Creatinine, Ser: 1.11 mg/dL (ref 0.61–1.24)
GFR, Estimated: >60 mL/min (ref >60)
Glucose, Bld: 118 mg/dL — ABNORMAL HIGH (ref 70–99)
Potassium: 3.9 mmol/L (ref 3.5–5.1)
Sodium: 135 mmol/L (ref 135–145)
Total Bilirubin: 0.9 mg/dL (ref 0.0–1.2)
Total Protein: 7 g/dL (ref 6.5–8.1)

## 2023-09-10 LAB — CBC
HCT: 25.3 % — ABNORMAL LOW (ref 39.0–52.0)
Hemoglobin: 8.2 g/dL — ABNORMAL LOW (ref 13.0–17.0)
MCH: 26.5 pg (ref 26.0–34.0)
MCHC: 32.4 g/dL (ref 30.0–36.0)
MCV: 81.9 fL (ref 80.0–100.0)
Platelets: 312 10*3/uL (ref 150–400)
RBC: 3.09 MIL/uL — ABNORMAL LOW (ref 4.22–5.81)
RDW: 18.6 % — ABNORMAL HIGH (ref 11.5–15.5)
WBC: 11.8 10*3/uL — ABNORMAL HIGH (ref 4.0–10.5)
nRBC: 0 % (ref 0.0–0.2)

## 2023-09-10 LAB — RESP PANEL BY RT-PCR (RSV, FLU A&B, COVID)  RVPGX2
Influenza A by PCR: NEGATIVE
Influenza B by PCR: NEGATIVE
Resp Syncytial Virus by PCR: NEGATIVE
SARS Coronavirus 2 by RT PCR: NEGATIVE

## 2023-09-10 LAB — PREPARE RBC (CROSSMATCH)

## 2023-09-10 LAB — LIPASE, BLOOD: Lipase: 20 U/L (ref 11–51)

## 2023-09-10 MED ORDER — SODIUM CHLORIDE 0.9% FLUSH
10.0000 mL | INTRAVENOUS | Status: DC | PRN
  Administered 2023-09-13: 10 mL

## 2023-09-10 MED ORDER — ONDANSETRON HCL 4 MG PO TABS: 4.0000 mg | ORAL_TABLET | Freq: Four times a day (QID) | ORAL | Status: DC | PRN

## 2023-09-10 MED ORDER — LIDOCAINE 5 % EX PTCH
1.0000 | MEDICATED_PATCH | CUTANEOUS | Status: DC
  Administered 2023-09-10 – 2023-09-13 (×3): 1 via TRANSDERMAL
  Filled 2023-09-10 (×4): qty 1

## 2023-09-10 MED ORDER — PANTOPRAZOLE SODIUM 40 MG IV SOLR
80.0000 mg | Freq: Once | INTRAVENOUS | Status: AC
  Administered 2023-09-10: 80 mg via INTRAVENOUS
  Filled 2023-09-10: qty 20

## 2023-09-10 MED ORDER — DIPHENHYDRAMINE HCL 50 MG/ML IJ SOLN: 12.5000 mg | Freq: Four times a day (QID) | INTRAMUSCULAR | Status: AC | PRN

## 2023-09-10 MED ORDER — ONDANSETRON HCL 4 MG/2ML IJ SOLN
4.0000 mg | Freq: Four times a day (QID) | INTRAMUSCULAR | Status: DC | PRN
  Administered 2023-09-11 – 2023-09-14 (×6): 4 mg via INTRAVENOUS
  Filled 2023-09-10 (×6): qty 2

## 2023-09-10 MED ORDER — ACETAMINOPHEN 650 MG RE SUPP: 650.0000 mg | Freq: Four times a day (QID) | RECTAL | Status: DC | PRN

## 2023-09-10 MED ORDER — PANTOPRAZOLE SODIUM 40 MG IV SOLR
40.0000 mg | Freq: Two times a day (BID) | INTRAVENOUS | Status: DC
  Administered 2023-09-10 – 2023-09-14 (×7): 40 mg via INTRAVENOUS
  Filled 2023-09-10 (×8): qty 10

## 2023-09-10 MED ORDER — LACTATED RINGERS IV BOLUS
1000.0000 mL | Freq: Once | INTRAVENOUS | Status: AC
  Administered 2023-09-10: 1000 mL via INTRAVENOUS

## 2023-09-10 MED ORDER — SODIUM CHLORIDE 0.9% FLUSH
10.0000 mL | Freq: Two times a day (BID) | INTRAVENOUS | Status: DC
  Administered 2023-09-10: 20 mL
  Administered 2023-09-11 – 2023-09-12 (×3): 10 mL
  Administered 2023-09-13: 20 mL
  Administered 2023-09-14: 10 mL

## 2023-09-10 MED ORDER — HYDRALAZINE HCL 20 MG/ML IJ SOLN: 5.0000 mg | Freq: Four times a day (QID) | INTRAMUSCULAR | Status: DC | PRN

## 2023-09-10 MED ORDER — SODIUM CHLORIDE 0.9 % IV SOLN
10.0000 mL/h | Freq: Once | INTRAVENOUS | Status: AC
  Administered 2023-09-10: 10 mL/h via INTRAVENOUS

## 2023-09-10 MED ORDER — ACETAMINOPHEN 325 MG PO TABS: 650.0000 mg | ORAL_TABLET | Freq: Four times a day (QID) | ORAL | Status: DC | PRN

## 2023-09-10 MED ORDER — LEVOTHYROXINE SODIUM 50 MCG PO TABS: 150.0000 ug | ORAL_TABLET | Freq: Every day | ORAL | Status: DC

## 2023-09-10 MED ORDER — CHLORHEXIDINE GLUCONATE CLOTH 2 % EX PADS
6.0000 | MEDICATED_PAD | Freq: Every day | CUTANEOUS | Status: DC
  Administered 2023-09-12 – 2023-09-13 (×2): 6 via TOPICAL
  Filled 2023-09-10 (×2): qty 6

## 2023-09-10 MED ORDER — IOHEXOL 350 MG/ML SOLN
100.0000 mL | Freq: Once | INTRAVENOUS | Status: AC | PRN
  Administered 2023-09-10: 100 mL via INTRAVENOUS

## 2023-09-10 MED ORDER — SENNOSIDES-DOCUSATE SODIUM 8.6-50 MG PO TABS
1.0000 | ORAL_TABLET | Freq: Every evening | ORAL | Status: DC | PRN
  Filled 2023-09-10: qty 1

## 2023-09-10 MED ORDER — FAMOTIDINE IN NACL 20-0.9 MG/50ML-% IV SOLN
20.0000 mg | Freq: Once | INTRAVENOUS | Status: DC
  Filled 2023-09-10: qty 50

## 2023-09-10 MED ORDER — SODIUM CHLORIDE 0.9 % IV SOLN: 12.5000 mg | Freq: Four times a day (QID) | INTRAVENOUS | Status: AC | PRN

## 2023-09-10 NOTE — ED Triage Notes (Signed)
 Pt to ED via [facility] from home. Pt reports coffee ground emesis x1 wk. Pt w/ hx of stomach cancer. Pt reports has not did chemo in one month. Pt wears 3L [place] chronically.

## 2023-09-10 NOTE — Assessment & Plan Note (Signed)
 Home levothyroxine  not resumed on admission as patient needs to be n.p.o. in setting of possible gastric outlet obstruction, SBO

## 2023-09-10 NOTE — Assessment & Plan Note (Signed)
 Patient is appropriate for end-of-life care, very poor prognosis Patient was seen by palliative and hospitalization from 08/18/2023 to 08/27/2023: Patient fired palliative care at that time Extensive discussion regarding goals of care at bedside, patient states that he would like to get chest compressions 1 more time

## 2023-09-10 NOTE — Assessment & Plan Note (Signed)
 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

## 2023-09-10 NOTE — Assessment & Plan Note (Signed)
 Continue Protonix  twice daily IV

## 2023-09-10 NOTE — Consult Note (Addendum)
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 thickening with innumerable tiny bilateral pulmonary nodules, right greater than left. Imaging features/inflammatory etiology or metastatic involvement. There was prominent airspace disease in both lung bases on previous chest CT of 08/24/2023 and this has improved in the interval. 6. Small right pleural effusion. 7. Layering high density material in the posterior right bladder lumen, potentially excreted contrast material although bladder stones not excluded. 8. 11 mm ill-defined subtle hypodense lesion in the posterior right liver (35/13 was not visible on the previous exam performed without intravenous contrast but is similar to a postcontrast CT of 07/04/2023. Posterior right capsular lesion along the liver with soft tissue nodularity along the anterior liver margin. Findings compatible with metastatic disease. 9.  Aortic Atherosclerosis (ICD10-I70.0). Electronically Signed   By: Camellia Candle M.D.   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 Full NPO at midnight Labs in am- bmp, cbc, inr Protonix  40 mg iv q12 h Hold dvt ppx Hold plavix  (last dose 09/10/23) Monitor H&H.  Transfusion and resuscitation as per primary team Avoid frequent lab draws to prevent lab induced anemia Supportive care and antiemetics as per primary team Maintain two sites IV access Avoid nsaids Monitor for GIB.  Consider NGT to Low intermittent wall suction for decompression  Esophagogastroduodenoscopy with possible biopsy, control of bleeding, polypectomy, and interventions as necessary has been discussed with the patient/patient representative. Informed consent was obtained from the patient/patient representative after explaining the indication, nature, and risks of the procedure including but not limited to death, bleeding, perforation, missed neoplasm/lesions, cardiorespiratory compromise, and reaction to medications. Opportunity for questions was given and appropriate answers were provided. Patient/patient representative has verbalized understanding is amenable to undergoing the procedure.   Patient and father seem to have poor insight into the severity of patient's disease/cancer. Conversations have been documented between the patient, his  oncologist, PCCM, and palliative care on separate occasions where this information was conveyed to him. Palliative care saw him on last hospitalization, but patient did not choose to participate.  Hospice is the most appropriate route for this patient at this time, but he would like everything to be done to live longer. I went on to explain to him that should  he require CPR, he would still wake up in a potentially worse state along with his metastatic cancer still being present. He still expressed he would like to be full code despite this  I offered the patient to manage things conservatively with blood transfusion and ppi in the case of a gi ulcer or esophagitis as likely causes of his bleeding issues, but pt preference is to undergo egd.  Should hemodynamic instability persist, consider stat CTA in case embolization is needed/appropriate  Extensive time spent in counseling patient, discussing disease, reviewing previous hospital and oncology notes  Management of other medical comorbidities as per primary team  I personally performed the service.  Thank you for allowing us  to participate in this patient's care. Please don't hesitate to call if any questions or concerns arise.   Elspeth Ozell Jungling, DO [facility]  Portions of the record may have been created with voice recognition software. Occasional wrong-word or 'sound-a-like' substitutions may have occurred due to the inherent limitations of voice recognition software.  Read the chart carefully and recognize, using context, where substitutions may have occurred.

## 2023-09-10 NOTE — ED Triage Notes (Signed)
 First nurse note: pt to ED [facility] from home for coffee ground emesis x4 days. Pt has stomach cancer. +shob. Wears 3 L [place] chronic. 500 NS PTA. 20g left AC

## 2023-09-10 NOTE — ED Provider Notes (Signed)
 [facility] Provider Note   Event Date/Time   First MD Initiated Contact with Patient 09/10/23 1204     (approximate) History  Hematemesis  HPI Patrick Bailey. is a 63 y.o. male with a stated past medical history of appendiceal cancer with metastasis currently on hospice who presents complaining of hematemesis over the last 7 to 8 days.  Patient states that he has had at least 2 episodes of emesis over this time.  Patient now states this morning that he is having significant abdominal pain with dyspnea on exertion, tachycardia, and worsening shortness of breath.  Patient also complains of melena over the last 2 weeks. ROS: Patient currently denies any vision changes, tinnitus, difficulty speaking, facial droop, sore throat, chest pain, shortness of breath, diarrhea, dysuria, or weakness/numbness/paresthesias in any extremity   Physical Exam  Triage Vital Signs: ED Triage Vitals [09/10/23 1030]  Encounter Vitals Group     BP 108/82     Systolic BP Percentile      Diastolic BP Percentile      Pulse Rate (!) 115     Resp 20     Temp 98 F (36.7 C)     Temp Source Oral     SpO2 98 %     Weight      Height      Head Circumference      Peak Flow      Pain Score 6     Pain Loc      Pain Education      Exclude from Growth Chart    Most recent vital signs: Vitals:   09/11/23 0500 09/11/23 0734  BP: 98/71   Pulse: (!) 112   Resp: 15   Temp:  97.7 F (36.5 C)  SpO2: 100%    General: Awake, oriented x4. CV:  Good peripheral perfusion.  Resp:  Normal effort.  Abd:  No distention.  Epigastric tenderness to palpation Other:  Middle-aged cachectic Caucasian male resting comfortably in no acute distress ED Results / Procedures / Treatments  Labs (all labs ordered are listed, but only abnormal results are displayed) Labs Reviewed  COMPREHENSIVE METABOLIC PANEL - Abnormal; Notable for the following components:      Result Value   Chloride 93 (*)     Glucose, Bld 118 (*)    BUN 29 (*)    Calcium  8.1 (*)    Albumin  2.7 (*)    Anion gap 16 (*)    All other components within normal limits  CBC - Abnormal; Notable for the following components:   WBC 11.8 (*)    RBC 3.09 (*)    Hemoglobin 8.2 (*)    HCT 25.3 (*)    RDW 18.6 (*)    All other components within normal limits  BASIC METABOLIC PANEL - Abnormal; Notable for the following components:   Potassium 3.2 (*)    Chloride 93 (*)    Glucose, Bld 109 (*)    BUN 27 (*)    Calcium  7.9 (*)    Anion gap 16 (*)    All other components within normal limits  CBC - Abnormal; Notable for the following components:   RBC 3.28 (*)    Hemoglobin 8.7 (*)    HCT 26.9 (*)    RDW 17.6 (*)    All other components within normal limits  PROTIME-INR - Abnormal; Notable for the following components:   Prothrombin Time 19.6 (*)    INR 1.6 (*)  All other components within normal limits  RESP PANEL BY RT-PCR (RSV, FLU A&B, COVID)  RVPGX2  LIPASE, BLOOD  URINALYSIS, ROUTINE W REFLEX MICROSCOPIC  TYPE AND SCREEN  PREPARE RBC (CROSSMATCH)   EKG ED ECG REPORT I, Artist MARLA Kerns, the attending physician, personally viewed and interpreted this ECG. Date: 09/10/2023 EKG Time: 1546 Rate: 113 Rhythm: Tachycardic sinus rhythm QRS Axis: normal Intervals: normal ST/T Wave abnormalities: normal Narrative Interpretation: Tachycardic sinus rhythm.  No evidence of acute ischemia RADIOLOGY ED MD interpretation: CT angiography of the abdomen pelvis shows interval increase in intra-abdominal mass with innumerable metastasis throughout the abdomen and chest. -Agree with radiology assessment Official radiology report(s): CT ANGIO GI BLEED Result Date: 09/10/2023 CLINICAL DATA:  Hematemesis. History of metastatic mucinous appendiceal adenocarcinoma. Coffee ground emesis for 1 week. * Tracking Code: BO * EXAM: CTA ABDOMEN AND PELVIS WITHOUT AND WITH CONTRAST TECHNIQUE: Multidetector CT imaging of the abdomen  and pelvis was performed using the standard protocol during bolus administration of intravenous contrast. Multiplanar reconstructed images and MIPs were obtained and reviewed to evaluate the vascular anatomy. RADIATION DOSE REDUCTION: This exam was performed according to the departmental dose-optimization program which includes automated exposure control, adjustment of the mA and/or kV according to patient size and/or use of iterative reconstruction technique. CONTRAST:  OMNIPAQUE  IOHEXOL  350 MG/ML SOLN COMPARISON:  Abdomen pelvis CT 08/18/2023. FINDINGS: VASCULAR Aorta: Normal caliber aorta without aneurysm, dissection, vasculitis or significant stenosis. Mild to moderate atherosclerotic calcification. Celiac: Patent without evidence of aneurysm, dissection, vasculitis or significant stenosis. SMA: Patent without evidence of aneurysm, dissection, vasculitis or significant stenosis. Renals: Both renal arteries are patent without evidence of aneurysm, dissection, vasculitis, fibromuscular dysplasia or significant stenosis. IMA: Patent without evidence of aneurysm, dissection, vasculitis or significant stenosis. Inflow: Patent without evidence of aneurysm, dissection, vasculitis or significant stenosis. Proximal Outflow: Bilateral common femoral and visualized portions of the superficial and profunda femoral arteries are patent without evidence of aneurysm, dissection, vasculitis or significant stenosis. Veins: No obvious venous abnormality within the limitations of this arterial phase study. Review of the MIP images confirms the above findings. NON-VASCULAR Lower chest: Fluid in the distal esophagus may be related to reflux or dysmotility. Diffuse bronchial wall thickening noted with innumerable tiny bilateral pulmonary nodules, right greater than left. There is consolidative airspace disease in the posterior right lower lobe with small right pleural effusion. The patchy basilar airspace disease was worse on a  previous chest CTA from 08/24/2023. Hepatobiliary: No suspicious focal abnormality within the liver parenchyma. 11 mm ill-defined subtle hypodense lesion in the posterior right liver (35/13 was not visible on the previous exam performed without intravenous contrast but is similar to a postcontrast CT of 07/04/2023. Subcapsular 19 x 10 mm posterior right hepatic lobe lesion on 29/13 was 29 x 22 mm previously (remeasured). Gallbladder is distended with some fluid around the gallbladder, nonspecific. Mild intrahepatic biliary duct prominence with no substantial extrahepatic biliary duct dilatation. Pancreas: Diffuse parenchymal atrophy without main duct dilatation. Spleen: No splenomegaly. No suspicious focal mass lesion. Adrenals/Urinary Tract: No adrenal nodule or mass. Kidneys unremarkable. No evidence for hydroureter. Layering high density material is seen in the posterior right bladder lumen, potentially excreted contrast material although bladder stones not excluded. Stomach/Bowel: Stomach is markedly distended with food and fluid. Wall thickening noted in the distal stomach/antral region. Duodenum is fluid-filled and distended. Small bowel loops in the left abdomen measure up to 5.3 cm diameter. Diffuse small bowel wall thickening is noted in the  abdomen with vascular congestion and areas of matted small bowel obscuring mesenteric fat in the central abdomen and upper pelvis. Right lower quadrant loculated rim enhancing fluid collection is similar to prior measuring 4.1 x 9.6 x 8.0 cm today (image 61/13). Ileocolic anastomosis noted anterior abdomen. Large colonic stool volume evident. No evidence for tear old phase contrast extravasation into the stomach or duodenum. No definite contrast extravasation in the small bowel or colon. Lymphatic: No retroperitoneal lymphadenopathy. No pelvic sidewall lymphadenopathy. Reproductive: The prostate gland and seminal vesicles are unremarkable. Other: Nodularity lung the  anterior liver margin is compatible with metastatic disease. The diffuse ill-defined amorphous soft tissue in the central mesentery appears progressive in the interval. This may be related to metastatic disease or edema from potential small bowel obstruction. Musculoskeletal: No worrisome lytic or sclerotic osseous abnormality. Body wall edema noted lower abdomen and pelvis. Bilateral groin hernias contain only fat. IMPRESSION: 1. No evidence for arterial phase contrast extravasation in the stomach, small bowel, or colon to suggest active arterial bleeding. 2. Marked distention of the stomach with food and fluid. There is prominent wall thickening in the distal stomach with luminal narrowing. Imaging features raise concern for a component of gastric outlet obstruction. Lower esophagus is fluid-filled suggesting reflux. 3. Duodenum is fluid-filled and distended. Small bowel loops in the left abdomen measure up to 5.3 cm diameter although other small bowel loops are decompressed. Diffuse small bowel wall thickening is noted in the abdomen with vascular congestion and areas of matted small bowel obscuring mesenteric fat in the central abdomen and upper pelvis. Diffuse ill-defined amorphous soft tissue in the central mesentery appears progressive in the interval. Imaging features compatible with metastatic disease. Given the markedly dilated small bowel loop in the left abdomen, small-bowel obstruction including a closed loop etiology cannot be excluded. 4. Right lower quadrant loculated rim enhancing fluid collection is similar to prior measuring 4.1 x 9.6 x 8.0 cm today. This could represent loculated ascites secondary to peritoneal/mesenteric metastatic disease. Abscess cannot be excluded. 5. Diffuse bronchial wall thickening with innumerable tiny bilateral pulmonary nodules, right greater than left. Imaging features/inflammatory etiology or metastatic involvement. There was prominent airspace disease in both lung  bases on previous chest CT of 08/24/2023 and this has improved in the interval. 6. Small right pleural effusion. 7. Layering high density material in the posterior right bladder lumen, potentially excreted contrast material although bladder stones not excluded. 8. 11 mm ill-defined subtle hypodense lesion in the posterior right liver (35/13 was not visible on the previous exam performed without intravenous contrast but is similar to a postcontrast CT of 07/04/2023. Posterior right capsular lesion along the liver with soft tissue nodularity along the anterior liver margin. Findings compatible with metastatic disease. 9.  Aortic Atherosclerosis (ICD10-I70.0). Electronically Signed   By: Camellia Candle M.D.   On: 09/10/2023 16:05   PROCEDURES: Critical Care performed: Yes, see critical care procedure note(s) Procedures MEDICATIONS ORDERED IN ED: Medications  pantoprazole  (PROTONIX ) injection 40 mg (40 mg Intravenous Given 09/10/23 1836)  acetaminophen  (TYLENOL ) tablet 650 mg (has no administration in time range)    Or  acetaminophen  (TYLENOL ) suppository 650 mg (has no administration in time range)  ondansetron  (ZOFRAN ) tablet 4 mg ( Oral See Alternative 09/11/23 0541)    Or  ondansetron  (ZOFRAN ) injection 4 mg (4 mg Intravenous Given 09/11/23 0541)  senna-docusate (Senokot-S) tablet 1 tablet (has no administration in time range)  lidocaine  (LIDODERM ) 5 % 1 patch (1 patch Transdermal Patch Removed 09/11/23  9584)  sodium chloride  flush (NS) 0.9 % injection 10-40 mL (20 mLs Intracatheter Given 09/10/23 2250)  sodium chloride  flush (NS) 0.9 % injection 10-40 mL (has no administration in time range)  Chlorhexidine  Gluconate Cloth 2 % PADS 6 each (6 each Topical Not Given 09/10/23 2102)  diphenhydrAMINE  (BENADRYL ) injection 12.5 mg (has no administration in time range)  hydrALAZINE  (APRESOLINE ) injection 5 mg (has no administration in time range)  promethazine  (PHENERGAN ) 12.5 mg in sodium chloride  0.9 % 50 mL IVPB  (has no administration in time range)  lactated ringers  bolus 1,000 mL (0 mLs Intravenous Stopped 09/10/23 1458)  0.9 %  sodium chloride  infusion (0 mL/hr Intravenous Stopped 09/10/23 2002)  iohexol  (OMNIPAQUE ) 350 MG/ML injection 100 mL (100 mLs Intravenous Contrast Given 09/10/23 1417)  pantoprazole  (PROTONIX ) injection 80 mg (80 mg Intravenous Given 09/10/23 1514)   IMPRESSION / MDM / ASSESSMENT AND PLAN / ED COURSE  I reviewed the triage vital signs and the nursing notes.                             The patient is on the cardiac monitor to evaluate for evidence of arrhythmia and/or significant heart rate changes. Patient's presentation is most consistent with acute presentation with potential threat to life or bodily function. + abdominal pain + mixed coffee ground and bright red hematemesis + black stool per rectum Given history and exam patient's presentation most consistent with upper GI bleed possibly secondary to peptic ulcer disease or variceal bleeding. I have low suspicion for aortoenteric fistula, ENT bleeding mimic, Boerhaave's, Pulmonary bleeding mimic.  Workup: CBC, BMP, LFTs, Lipase, PT/INR, Type and Screen  Interventions: Analgesia and antiemetic medications PRN Protonix  40mg  IVP Octreotide 50mcg push -> 50 mcg/hr drip Ceftriaxone  1 gram IV PRBC transfusion  Findings: Hb: 8.7  Disposition: Admit for close monitoring.   FINAL CLINICAL IMPRESSION(S) / ED DIAGNOSES   Final diagnoses:  Symptomatic anemia  Acute upper GI bleeding  Hematemesis with nausea  Epigastric abdominal pain  Hypotension, unspecified hypotension type   Rx / DC Orders   ED Discharge Orders     None      Note:  This document was prepared using Dragon voice recognition software and may include unintentional dictation errors.   Varonica Siharath K, MD 09/11/23 [phone]

## 2023-09-10 NOTE — Assessment & Plan Note (Signed)
 No scheduled antihypertensive medication will be given in the hospital Hydralazine  5 mg IV every 6 hours as needed for SBP greater than 170, 6 days ordered

## 2023-09-10 NOTE — ED Notes (Signed)
 Blood consent signed at bedside by this RN and the patient.

## 2023-09-10 NOTE — IPAL (Addendum)
  Interdisciplinary Goals of Care Family Meeting   Date carried out: 09/10/2023  Location of the meeting: Bedside  Member's involved: Physician, Bedside Registered Nurse, and Family Member or next of kin, father at bedside.  Durable Power of Insurance risk surveyor: Patient is awake and alert to self, location, current age, current year.    Discussion: We discussed goals of care for [employer]. SABRA    Extensive discussion at bedside regarding his poor prognosis and failure to thrive .  Patient is a poor candidate for any anesthesiology procedure.  I discussed extensively that pursuing endoscopy and treatment will culminate to futile care. Patient will suffer from chest compression resulting in breastbone and ribs fracture. He will experience excruciating pain with essentially zero likelihood of meaningful recovery.  Code status:   Code Status: Full Code   Disposition: Continue current acute care  Time spent for the meeting: 32 minutes  Dr. Sherre  09/10/2023, 4:47 PM

## 2023-09-10 NOTE — Assessment & Plan Note (Signed)
 Suspect secondary to SBO/GOO Avoid opioid at this time given patient is low normotensive requiring fluid/PRBC to maintain perfusion IV Toradol /NSAID cannot be given as this can increase bleeding risk Lidocaine , 1 patch transdermal every 24 hours
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
## 2023-09-10 NOTE — Hospital Course (Addendum)
 [name] is a 63 year old male with history of appendiceal carcinoma with metastasis, peritoneal carcinomatosis, history of left CVA in December 2023, hypertension, hyperlipidemia, depression, anxiety, failure to thrive , GERD, hypothyroid, who presents emergency department for chief concerns of coffee-ground emesis. CT abdomen/pelvis showed small bowel obstructions at multiple levels, distended stomach. Patient condition is terminal, for comfort, patient was started on NG suction, IV fluids, and the pain medicine. After long discussion with the family, decision was made to transfer patient back to hospice.   Patient is made a DNR.  Patient will not be able to eat, will attempt to place to vent PEG, but does not seem to be appropriate for GI and IR.  At this point, does not seem to offer, patient will be discharged home with hospice.

## 2023-09-10 NOTE — Assessment & Plan Note (Signed)
 Patient reports the rash developed after patient received IV injection of medication for bleeding stomach Protonix  benefits in setting of upper GI bleed outweighs risk of allergy reaction Continue Protonix  IV as ordered Benadryl  12.5 mg IV every 6 hours as needed for allergies and itching, 1 day ordered

## 2023-09-10 NOTE — Assessment & Plan Note (Signed)
 Or gastric outlet obstruction NPO If patient has persistent nausea or vomiting, we can order NG tube placement with intermittent wall suction

## 2023-09-11 ENCOUNTER — Encounter
Admission: EM | Disposition: A | Discharge status: Hospice | Payer: Self-pay | Source: Home / Self Care | Attending: Internal Medicine

## 2023-09-11 ENCOUNTER — Encounter: Payer: Self-pay | Admitting: Internal Medicine

## 2023-09-11 ENCOUNTER — Emergency Department: Payer: BC Managed Care – PPO

## 2023-09-11 DIAGNOSIS — R627 Adult failure to thrive: Secondary | ICD-10-CM

## 2023-09-11 DIAGNOSIS — C181 Malignant neoplasm of appendix: Secondary | ICD-10-CM | POA: Diagnosis not present

## 2023-09-11 DIAGNOSIS — E43 Unspecified severe protein-calorie malnutrition: Secondary | ICD-10-CM | POA: Diagnosis not present

## 2023-09-11 DIAGNOSIS — K922 Gastrointestinal hemorrhage, unspecified: Secondary | ICD-10-CM | POA: Diagnosis not present

## 2023-09-11 LAB — MAGNESIUM: Magnesium: 1.1 mg/dL — ABNORMAL LOW (ref 1.7–2.4)

## 2023-09-11 LAB — BPAM RBC
Blood Product Expiration Date: 202503132359
ISSUE DATE / TIME: 202502071607
Unit Type and Rh: 6200

## 2023-09-11 LAB — BASIC METABOLIC PANEL
Anion gap: 16 — ABNORMAL HIGH (ref 5–15)
BUN: 27 mg/dL — ABNORMAL HIGH (ref 8–23)
CO2: 26 mmol/L (ref 22–32)
Calcium: 7.9 mg/dL — ABNORMAL LOW (ref 8.9–10.3)
Chloride: 93 mmol/L — ABNORMAL LOW (ref 98–111)
Creatinine, Ser: 1 mg/dL (ref 0.61–1.24)
GFR, Estimated: >60 mL/min (ref >60)
Glucose, Bld: 109 mg/dL — ABNORMAL HIGH (ref 70–99)
Potassium: 3.2 mmol/L — ABNORMAL LOW (ref 3.5–5.1)
Sodium: 135 mmol/L (ref 135–145)

## 2023-09-11 LAB — CBC
HCT: 26.9 % — ABNORMAL LOW (ref 39.0–52.0)
Hemoglobin: 8.7 g/dL — ABNORMAL LOW (ref 13.0–17.0)
MCH: 26.5 pg (ref 26.0–34.0)
MCHC: 32.3 g/dL (ref 30.0–36.0)
MCV: 82 fL (ref 80.0–100.0)
Platelets: 235 10*3/uL (ref 150–400)
RBC: 3.28 MIL/uL — ABNORMAL LOW (ref 4.22–5.81)
RDW: 17.6 % — ABNORMAL HIGH (ref 11.5–15.5)
WBC: 10.1 10*3/uL (ref 4.0–10.5)
nRBC: 0 % (ref 0.0–0.2)

## 2023-09-11 LAB — TYPE AND SCREEN
ABO/RH(D): A POS
Antibody Screen: NEGATIVE
Unit division: 0

## 2023-09-11 LAB — PROTIME-INR
INR: 1.6 — ABNORMAL HIGH (ref 0.8–1.2)
Prothrombin Time: 19.6 s — ABNORMAL HIGH (ref 11.4–15.2)

## 2023-09-11 SURGERY — ESOPHAGOGASTRODUODENOSCOPY (EGD) WITH PROPOFOL
Anesthesia: General

## 2023-09-11 MED ORDER — POTASSIUM CHLORIDE IN NACL 20-0.9 MEQ/L-% IV SOLN
INTRAVENOUS | Status: AC
  Filled 2023-09-11 (×5): qty 1000

## 2023-09-11 MED ORDER — POTASSIUM CHLORIDE 10 MEQ/100ML IV SOLN
10.0000 meq | INTRAVENOUS | Status: AC
  Administered 2023-09-11 (×3): 10 meq via INTRAVENOUS
  Filled 2023-09-11 (×4): qty 100

## 2023-09-11 MED ORDER — HYDROMORPHONE HCL 1 MG/ML IJ SOLN
0.5000 mg | INTRAMUSCULAR | Status: DC | PRN
  Administered 2023-09-11 – 2023-09-12 (×2): 0.5 mg via INTRAVENOUS
  Filled 2023-09-11 (×2): qty 0.5

## 2023-09-11 MED ORDER — HYDROMORPHONE HCL 1 MG/ML IJ SOLN
0.5000 mg | INTRAMUSCULAR | Status: DC | PRN
  Administered 2023-09-11 (×3): 0.5 mg via INTRAVENOUS
  Filled 2023-09-11 (×3): qty 0.5

## 2023-09-11 MED ORDER — MAGNESIUM SULFATE 4 GM/100ML IV SOLN
4.0000 g | Freq: Once | INTRAVENOUS | Status: AC
  Administered 2023-09-11: 4 g via INTRAVENOUS
  Filled 2023-09-11 (×2): qty 100

## 2023-09-11 NOTE — ED Notes (Signed)
 Messaged provider about patient request for stronger pain medication.

## 2023-09-11 NOTE — Progress Notes (Signed)
 Inpatient Follow-up/Progress Note   Patient ID: Patrick Bailey. is a 63 y.o. male.  Overnight Events / Subjective Findings Pt with emesis overnight. Reviewed his CT scan with him. Discussed case with him, his sister (on phone) and anesthesia. Decision to cancel EGD as risks are outweigh benefits at this juncture. Still with abdominal discomfort. Previously refused NGT o/n.   Review of Systems  Constitutional:  Positive for activity change, appetite change, fatigue, fever and unexpected weight change. Negative for chills and diaphoresis.  HENT:  Negative for trouble swallowing and voice change.   Respiratory:  Negative for shortness of breath and wheezing.   Cardiovascular:  Negative for chest pain, palpitations and leg swelling.  Gastrointestinal:  Positive for blood in stool (melena), nausea and vomiting (coffee ground emesis). Negative for abdominal distention, abdominal pain, anal bleeding, constipation and diarrhea.  Musculoskeletal:  Negative for arthralgias and myalgias.  Skin:  Negative for color change and pallor.  Neurological:  Positive for weakness. Negative for dizziness and syncope.  Psychiatric/Behavioral:  Negative for confusion. The patient is not nervous/anxious.   All other systems reviewed and are negative.  Medications  Current Facility-Administered Medications:    acetaminophen  (TYLENOL ) tablet 650 mg, 650 mg, Oral, Q6H PRN **OR** acetaminophen  (TYLENOL ) suppository 650 mg, 650 mg, Rectal, Q6H PRN, Cox, Amy N, DO   Chlorhexidine  Gluconate Cloth 2 % PADS 6 each, 6 each, Topical, Daily, Cox, Amy N, DO   diphenhydrAMINE  (BENADRYL ) injection 12.5 mg, 12.5 mg, Intravenous, Q6H PRN, Cox, Amy N, DO   hydrALAZINE  (APRESOLINE ) injection 5 mg, 5 mg, Intravenous, Q6H PRN, Cox, Amy N, DO   HYDROmorphone  (DILAUDID ) injection 0.5 mg, 0.5 mg, Intravenous, Q4H PRN, Zhang, Dekui, MD   lidocaine  (LIDODERM ) 5 % 1 patch, 1 patch, Transdermal, Q24H, Cox, Amy N, DO, 1 patch at  09/10/23 1602   ondansetron  (ZOFRAN ) tablet 4 mg, 4 mg, Oral, Q6H PRN **OR** ondansetron  (ZOFRAN ) injection 4 mg, 4 mg, Intravenous, Q6H PRN, Cox, Amy N, DO, 4 mg at 09/11/23 0541   pantoprazole  (PROTONIX ) injection 40 mg, 40 mg, Intravenous, Q12H, Cox, Amy N, DO, 40 mg at 09/10/23 1836   potassium chloride  10 mEq in 100 mL IVPB, 10 mEq, Intravenous, Q1 Hr x 3, Laurita Pillion, MD, Last Rate: 100 mL/hr at 09/11/23 0908, 10 mEq at 09/11/23 0908   promethazine  (PHENERGAN ) 12.5 mg in sodium chloride  0.9 % 50 mL IVPB, 12.5 mg, Intravenous, Q6H PRN, Cox, Amy N, DO   senna-docusate (Senokot-S) tablet 1 tablet, 1 tablet, Oral, QHS PRN, Cox, Amy N, DO   sodium chloride  flush (NS) 0.9 % injection 10-40 mL, 10-40 mL, Intracatheter, Q12H, Cox, Amy N, DO, 20 mL at 09/10/23 2250   sodium chloride  flush (NS) 0.9 % injection 10-40 mL, 10-40 mL, Intracatheter, PRN, Cox, Amy N, DO  Current Outpatient Medications:    alum & mag hydroxide-simeth (MAALOX/MYLANTA) 200-200-20 MG/5ML suspension, Take 30 mLs by mouth every 6 (six) hours as needed for indigestion or heartburn., Disp: , Rfl:    atorvastatin  (LIPITOR) 40 MG tablet, Take 1 tablet (40 mg total) by mouth daily., Disp: 90 tablet, Rfl: 4   calcium  carbonate (TUMS - DOSED IN MG ELEMENTAL CALCIUM ) 500 MG chewable tablet, Chew 1 tablet (200 mg of elemental calcium  total) by mouth 3 (three) times daily., Disp: , Rfl:    docusate sodium  (COLACE) 100 MG capsule, Take 1 capsule (100 mg total) by mouth 2 (two) times daily., Disp: 60 capsule, Rfl: 0   fenofibrate  160  MG tablet, Take 1 tablet by mouth once daily, Disp: 90 tablet, Rfl: 0   finasteride  (PROSCAR ) 5 MG tablet, Take 1 tablet (5 mg total) by mouth every evening., Disp: 30 tablet, Rfl: 0   lipase/protease/amylase (CREON ) 36000 UNITS CPEP capsule, Take 2 capsules (72,000 Units total) by mouth 3 (three) times daily with meals. May also take 1 capsule (36,000 Units total) as needed (with snacks - up to 4 snacks daily).,  Disp: 300 capsule, Rfl: 11   MITIGARE  0.6 MG CAPS, Take 0.6 mg by mouth daily as needed (gout flare). TAKE 1 CAPSULE BY MOUTH IN THE MORNING AS NEEDED (Patient taking differently: Take 0.6 mg by mouth daily as needed (gout flare).), Disp: , Rfl:    oxyCODONE  20 MG TABS, Take 0.5-1 tablets (10-20 mg total) by mouth every 6 (six) hours as needed for severe pain (pain score 7-10) or moderate pain (pain score 4-6)., Disp: 20 tablet, Rfl: 0   amitriptyline  (ELAVIL ) 100 MG tablet, Take by mouth., Disp: , Rfl:    amLODipine  (NORVASC ) 10 MG tablet, Take 1 tablet by mouth daily., Disp: , Rfl:    aspirin  EC 81 MG tablet, Take 1 tablet (81 mg total) by mouth daily. Swallow whole. (Patient not taking: Reported on 09/11/2023), Disp: 30 tablet, Rfl: 2   cephALEXin (KEFLEX) 500 MG capsule, Take 500 mg by mouth 4 (four) times daily., Disp: , Rfl:    clopidogrel  (PLAVIX ) 75 MG tablet, Take 1 tablet by mouth once daily, Disp: 90 tablet, Rfl: 0   dexamethasone  (DECADRON ) 2 MG tablet, Take 2 mg by mouth daily., Disp: , Rfl:    diphenoxylate-atropine  (LOMOTIL) 2.5-0.025 MG tablet, Take by mouth., Disp: , Rfl:    gabapentin  (NEURONTIN ) 400 MG capsule, Take 1 capsule (400 mg total) by mouth 2 (two) times daily AND 3 capsules (1,200 mg total) at bedtime. TAKE 1 CAPSULE BY MOUTH TWICE DAILY AND 3 AT BEDTIME Strength: 400 mg., Disp: 450 capsule, Rfl: 4   HYDROmorphone  (DILAUDID ) 4 MG tablet, Take 4 mg by mouth every 4 (four) hours as needed. (Patient not taking: Reported on 09/11/2023), Disp: , Rfl:    levothyroxine  (SYNTHROID ) 150 MCG tablet, Take 1 tablet (150 mcg total) by mouth daily., Disp: 90 tablet, Rfl: 3   LONSURF 15-6.14 MG tablet, Take by mouth., Disp: , Rfl:    MAGNESIUM -OXIDE 400 (240 Mg) MG tablet, Take 1 tablet by mouth 2 (two) times daily., Disp: , Rfl:    methocarbamol  (ROBAXIN ) 500 MG tablet, Take 1,000 mg by mouth 2 (two) times daily., Disp: , Rfl:    metoCLOPramide  (REGLAN ) 10 MG tablet, Take by mouth., Disp:  , Rfl:    midodrine  (PROAMATINE ) 10 MG tablet, Take 1 tablet (10 mg total) by mouth 3 (three) times daily with meals., Disp: 90 tablet, Rfl: 0   Multiple Vitamin (MULTIVITAMIN WITH MINERALS) TABS tablet, Take 1 tablet by mouth daily., Disp: , Rfl:    naloxone  (NARCAN ) nasal spray 4 mg/0.1 mL, Place 1 spray into the nose daily., Disp: , Rfl:    ofloxacin  (FLOXIN ) 0.3 % OTIC solution, Place 5 drops into the right ear daily. (Patient not taking: Reported on 09/11/2023), Disp: 5 mL, Rfl: 0   ondansetron  (ZOFRAN ) 4 MG tablet, Take 1 tablet (4 mg total) by mouth daily as needed for nausea or vomiting. (Patient not taking: Reported on 09/11/2023), Disp: 30 tablet, Rfl: 1   pantoprazole  (PROTONIX ) 40 MG tablet, Take 1 tablet (40 mg total) by mouth daily., Disp: 90 tablet,  Rfl: 3   polyethylene glycol (MIRALAX  / GLYCOLAX ) 17 g packet, Take 17 g by mouth daily., Disp: 14 each, Rfl: 0   potassium chloride  (KLOR-CON ) 10 MEQ tablet, Take 10 mEq by mouth 2 (two) times daily. (Patient not taking: Reported on 09/11/2023), Disp: , Rfl:    prochlorperazine (COMPAZINE) 10 MG tablet, Take 10 mg by mouth every 6 (six) hours as needed. (Patient not taking: Reported on 09/11/2023), Disp: , Rfl:    sodium chloride  1 g tablet, Take 1 tablet (1 g total) by mouth 2 (two) times daily with a meal., Disp: 60 tablet, Rfl: 0  potassium chloride  10 mEq (09/11/23 0908)   promethazine  (PHENERGAN ) injection (IM or IVPB)      acetaminophen  **OR** acetaminophen , diphenhydrAMINE , hydrALAZINE , HYDROmorphone  (DILAUDID ) injection, ondansetron  **OR** ondansetron  (ZOFRAN ) IV, promethazine  (PHENERGAN ) injection (IM or IVPB), senna-docusate, sodium chloride  flush   Objective    Vitals:   09/11/23 0430 09/11/23 0500 09/11/23 0734 09/11/23 0800  BP: (!) 120/93 98/71  108/75  Pulse:  (!) 112  (!) 115  Resp: 20 15  18   Temp:   97.7 F (36.5 C) 98.7 F (37.1 C)  TempSrc:   Oral Oral  SpO2:  100%  97%  Weight:    70.3 kg  Height:    6' (1.829 m)      Physical Exam Vitals and nursing note reviewed.  Constitutional:      General: He is not in acute distress.    Appearance: Normal appearance. He is ill-appearing. He is not toxic-appearing or diaphoretic.     Comments: Frail and cachetic appearing  HENT:     Head: Normocephalic and atraumatic.     Comments: Bitemporal wasting Dry; appears to have old emesis along his teeth/beard    Nose: Nose normal.     Mouth/Throat:     Comments: Dry; appears to have old emesis along his teeth/beard Eyes:     General: No scleral icterus.    Extraocular Movements: Extraocular movements intact.  Cardiovascular:     Rate and Rhythm: Regular rhythm. Tachycardia present.  Pulmonary:     Comments: Coarse, productive cough. Poor air movement. Barrel chested. Diminished breath sounds bilaterally Abdominal:     Tenderness: There is no abdominal tenderness. There is no guarding or rebound.     Comments: Abdomen is hard to palpation (suspected relating to mets) and stomach/bowel distention  Musculoskeletal:     Cervical back: Neck supple.     Right lower leg: No edema.     Left lower leg: No edema.  Skin:    General: Skin is warm and dry.     Coloration: Skin is not jaundiced or pale.  Neurological:     General: No focal deficit present.     Mental Status: He is alert and oriented to person, place, and time. Mental status is at baseline.     Laboratory Data Recent Labs  Lab 09/10/23 1034 09/11/23 0446  WBC 11.8* 10.1  HGB 8.2* 8.7*  HCT 25.3* 26.9*  PLT 312 235   Recent Labs  Lab 09/10/23 1034 09/11/23 0446  NA 135 135  K 3.9 3.2*  CL 93* 93*  CO2 26 26  BUN 29* 27*  CREATININE 1.11 1.00  CALCIUM  8.1* 7.9*  PROT 7.0  --   BILITOT 0.9  --   ALKPHOS 56  --   ALT 11  --   AST 19  --   GLUCOSE 118* 109*   Recent Labs  Lab 09/11/23  0446  INR 1.6*      Imaging Studies: CT ANGIO GI BLEED Result Date: 09/10/2023 CLINICAL DATA:  Hematemesis. History of metastatic mucinous  appendiceal adenocarcinoma. Coffee ground emesis for 1 week. * Tracking Code: BO * EXAM: CTA ABDOMEN AND PELVIS WITHOUT AND WITH CONTRAST TECHNIQUE: Multidetector CT imaging of the abdomen and pelvis was performed using the standard protocol during bolus administration of intravenous contrast. Multiplanar reconstructed images and MIPs were obtained and reviewed to evaluate the vascular anatomy. RADIATION DOSE REDUCTION: This exam was performed according to the departmental dose-optimization program which includes automated exposure control, adjustment of the mA and/or kV according to patient size and/or use of iterative reconstruction technique. CONTRAST:  OMNIPAQUE  IOHEXOL  350 MG/ML SOLN COMPARISON:  Abdomen pelvis CT 08/18/2023. FINDINGS: VASCULAR Aorta: Normal caliber aorta without aneurysm, dissection, vasculitis or significant stenosis. Mild to moderate atherosclerotic calcification. Celiac: Patent without evidence of aneurysm, dissection, vasculitis or significant stenosis. SMA: Patent without evidence of aneurysm, dissection, vasculitis or significant stenosis. Renals: Both renal arteries are patent without evidence of aneurysm, dissection, vasculitis, fibromuscular dysplasia or significant stenosis. IMA: Patent without evidence of aneurysm, dissection, vasculitis or significant stenosis. Inflow: Patent without evidence of aneurysm, dissection, vasculitis or significant stenosis. Proximal Outflow: Bilateral common femoral and visualized portions of the superficial and profunda femoral arteries are patent without evidence of aneurysm, dissection, vasculitis or significant stenosis. Veins: No obvious venous abnormality within the limitations of this arterial phase study. Review of the MIP images confirms the above findings. NON-VASCULAR Lower chest: Fluid in the distal esophagus may be related to reflux or dysmotility. Diffuse bronchial wall thickening noted with innumerable tiny bilateral pulmonary  nodules, right greater than left. There is consolidative airspace disease in the posterior right lower lobe with small right pleural effusion. The patchy basilar airspace disease was worse on a previous chest CTA from 08/24/2023. Hepatobiliary: No suspicious focal abnormality within the liver parenchyma. 11 mm ill-defined subtle hypodense lesion in the posterior right liver (35/13 was not visible on the previous exam performed without intravenous contrast but is similar to a postcontrast CT of 07/04/2023. Subcapsular 19 x 10 mm posterior right hepatic lobe lesion on 29/13 was 29 x 22 mm previously (remeasured). Gallbladder is distended with some fluid around the gallbladder, nonspecific. Mild intrahepatic biliary duct prominence with no substantial extrahepatic biliary duct dilatation. Pancreas: Diffuse parenchymal atrophy without main duct dilatation. Spleen: No splenomegaly. No suspicious focal mass lesion. Adrenals/Urinary Tract: No adrenal nodule or mass. Kidneys unremarkable. No evidence for hydroureter. Layering high density material is seen in the posterior right bladder lumen, potentially excreted contrast material although bladder stones not excluded. Stomach/Bowel: Stomach is markedly distended with food and fluid. Wall thickening noted in the distal stomach/antral region. Duodenum is fluid-filled and distended. Small bowel loops in the left abdomen measure up to 5.3 cm diameter. Diffuse small bowel wall thickening is noted in the abdomen with vascular congestion and areas of matted small bowel obscuring mesenteric fat in the central abdomen and upper pelvis. Right lower quadrant loculated rim enhancing fluid collection is similar to prior measuring 4.1 x 9.6 x 8.0 cm today (image 61/13). Ileocolic anastomosis noted anterior abdomen. Large colonic stool volume evident. No evidence for tear old phase contrast extravasation into the stomach or duodenum. No definite contrast extravasation in the small bowel  or colon. Lymphatic: No retroperitoneal lymphadenopathy. No pelvic sidewall lymphadenopathy. Reproductive: The prostate gland and seminal vesicles are unremarkable. Other: Nodularity lung the anterior liver margin is compatible with metastatic  disease. The diffuse ill-defined amorphous soft tissue in the central mesentery appears progressive in the interval. This may be related to metastatic disease or edema from potential small bowel obstruction. Musculoskeletal: No worrisome lytic or sclerotic osseous abnormality. Body wall edema noted lower abdomen and pelvis. Bilateral groin hernias contain only fat. IMPRESSION: 1. No evidence for arterial phase contrast extravasation in the stomach, small bowel, or colon to suggest active arterial bleeding. 2. Marked distention of the stomach with food and fluid. There is prominent wall thickening in the distal stomach with luminal narrowing. Imaging features raise concern for a component of gastric outlet obstruction. Lower esophagus is fluid-filled suggesting reflux. 3. Duodenum is fluid-filled and distended. Small bowel loops in the left abdomen measure up to 5.3 cm diameter although other small bowel loops are decompressed. Diffuse small bowel wall thickening is noted in the abdomen with vascular congestion and areas of matted small bowel obscuring mesenteric fat in the central abdomen and upper pelvis. Diffuse ill-defined amorphous soft tissue in the central mesentery appears progressive in the interval. Imaging features compatible with metastatic disease. Given the markedly dilated small bowel loop in the left abdomen, small-bowel obstruction including a closed loop etiology cannot be excluded. 4. Right lower quadrant loculated rim enhancing fluid collection is similar to prior measuring 4.1 x 9.6 x 8.0 cm today. This could represent loculated ascites secondary to peritoneal/mesenteric metastatic disease. Abscess cannot be excluded. 5. Diffuse bronchial wall thickening  with innumerable tiny bilateral pulmonary nodules, right greater than left. Imaging features/inflammatory etiology or metastatic involvement. There was prominent airspace disease in both lung bases on previous chest CT of 08/24/2023 and this has improved in the interval. 6. Small right pleural effusion. 7. Layering high density material in the posterior right bladder lumen, potentially excreted contrast material although bladder stones not excluded. 8. 11 mm ill-defined subtle hypodense lesion in the posterior right liver (35/13 was not visible on the previous exam performed without intravenous contrast but is similar to a postcontrast CT of 07/04/2023. Posterior right capsular lesion along the liver with soft tissue nodularity along the anterior liver margin. Findings compatible with metastatic disease. 9.  Aortic Atherosclerosis (ICD10-I70.0). Electronically Signed   By: Camellia Candle M.D.   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 N1b -Status post right hemicolectomy -Systemic chemotherapy failure with progression of disease and chemotherapy intolerance - peritoneal carcinomatosis   #Right MCA stroke December 2023 on Plavix    # cachexia # Severe protein calorie malnutrition   #NSAID dependence   # COPD Home O2 dependence-3 L # Recent multifocal pna   # h/o etoh and tobacco dependence    Plan:  After extensive discussion with patient, patient's sister (on phone), and anesthesia- all parties agreed it is best  to defer invasive procedures, in this case EGD, as he is high risk for complications such as aspiration requiring intubation in setting of potential SBO/GOO with food/debris in the stomach  Both he and his sister agreed that conservative measures  such as bid ppi are the best course of action at this time given his advanced malignancy Continue to hold plavix . Hold DVT pppx Given possible SBO/GOO, will maintain NPO status for now depending if patient moves back towards hospice/comfort care. Pending patient preferences, recommendation remains for NGT to [facility]WS at this time. Monitor H&H.  Transfusion and resuscitation as per primary team Avoid frequent lab draws to prevent lab induced anemia Supportive care and antiemetics as per primary team Maintain two sites IV access Avoid nsaids Monitor for GIB.  I returned back to patient's room to discuss case with his sister and father again at bedside.   Management of other medical comorbidities as per primary team  I personally performed the service.  Thank you for allowing us  to participate in this patient's care. Please don't hesitate to call if any questions or concerns arise.   Elspeth Ozell Jungling, DO [facility]  Portions of the record may have been created with voice recognition software. Occasional wrong-word or 'sound-a-like' substitutions may have occurred due to the inherent limitations of voice recognition software.  Read the chart carefully and recognize, using context, where substitutions may have occurred.

## 2023-09-11 NOTE — ED Notes (Signed)
 Messaged pharmacy about Magnesium  sulfate "missing dose".

## 2023-09-11 NOTE — Progress Notes (Signed)
 Progress Note   Patient: Patrick Bailey. FMW:[id number] DOB: [birth year] DOA: 09/10/2023     1 DOS: the patient was seen and examined on 09/11/2023   Brief hospital course: [name] is a 63 year old male with history of appendiceal carcinoma with metastasis, peritoneal carcinomatosis, history of left CVA in December 2023, hypertension, hyperlipidemia, depression, anxiety, failure to thrive , GERD, hypothyroid, who presents emergency department for chief concerns of coffee-ground emesis. CT abdomen/pelvis showed small bowel obstructions at multiple levels, distended stomach.    Principal Problem:   Upper GI bleed Active Problems:   Alcohol abuse   Appendix carcinoma (HCC)   Abdominal pain   Protein-calorie malnutrition, severe   Essential hypertension   History of cerebellar stroke   GERD (gastroesophageal reflux disease)   HLD (hyperlipidemia)   Depression with anxiety   Hypokalemia   Hypomagnesemia   COPD (chronic obstructive pulmonary disease) (HCC)   Hypothyroidism   Hypertriglyceridemia   BPH (benign prostatic hyperplasia)   Immunocompromised (HCC)   SBO (small bowel obstruction) (HCC)   Failure to thrive  in adult   Rash   Assessment and Plan: Adenocarcinoma of appendix with metastasis. Small bowel obstruction secondary to cancer metastasis. Gastric outlet obstruction secondary to cancer metastasis. * Upper GI bleed  Acute blood loss anemia s/p PRBC. Patient has a terminal cancer, which has caused significant bowel obstruction.  Patient has a large amount of gastric content which caused discomfort.  NG tube placed to suction it out.  Afterwards, patient be only placed on liquid diet tomorrow. Protonix  was started for GI bleed.  Patient also being seen by GI. Had a long discussion with patient and his sister, condition terminal.  There is no option for treatment.  Patient is dying from cancer.  We also talk about to not resuscitate status, but family and the  patient does not want it. They insist to get IV fluids as patient has not been able to eat.  I explained to them that IV fluids will only slow down but not able to prevent the inevitable. At this point, I will obtain palliative care to sort this out. Placed on Dilaudid  for pain.  Hyponatremia. Hypokalemia. Severe hypomagnesemia. Currently, family does not want comfort care yet.  Will continue supplement electrolytes and recheck levels tomorrow.  Essential hypertension Hold off all blood pressure medicines.  Hypothyroidism Hold off Synthroid .  Rash Appeared at IV site, appear to be improving.  Failure to thrive  in adult Severe protein calorie malnutrition. No treatment option.       Subjective:  Patient complaining of abdominal pain, requesting pain medicine.  No nausea vomiting.  Physical Exam: Vitals:   09/11/23 0500 09/11/23 0734 09/11/23 0800 09/11/23 1000  BP: 98/71  108/75 104/75  Pulse: (!) 112  (!) 115 (!) 119  Resp: 15  18 13   Temp:  97.7 F (36.5 C) 98.7 F (37.1 C)   TempSrc:  Oral Oral   SpO2: 100%  97% 99%  Weight:   70.3 kg   Height:   6' (1.829 m)    General exam: Appears calm and comfortable, severely malnourished. Respiratory system: Clear to auscultation. Respiratory effort normal. Cardiovascular system: S1 & S2 heard, RRR. No JVD, murmurs, rubs, gallops or clicks. No pedal edema. Gastrointestinal system: Abdomen is nondistended, soft and nontender. No organomegaly or masses felt. Normal bowel sounds heard. Central nervous system: Alert and oriented. No focal neurological deficits. Extremities: Symmetric 5 x 5 power. Skin: No rashes, lesions or ulcers Psychiatry:  Mood &  affect appropriate.    Data Reviewed:  CT scan results and lab results reviewed.  Family Communication: Sister and father updated at the bedside.  Disposition: Patient is appropriate for inpatient due to IV treatment,     Time spent: 50 minutes  Author: Murvin Mana, MD 09/11/2023 12:21 PM  For on call review [email or web address].

## 2023-09-11 NOTE — ED Notes (Signed)
 Endo transferred patient off floor.

## 2023-09-11 NOTE — Care Plan (Signed)
 Brief GI note  Spoke with patient along with his sister and father at bedside Discussed the current course outlined in my previous note. Continue twice daily PPI No planned endoscopic intervention The patient is open to returning to hospice and being evaluated by palliative care given his current medical condition  GI to sign off. Available as needed. Please do not hesitate to call regarding questions or concerns.  Elspeth EMERSON Jungling, DO [facility]

## 2023-09-11 NOTE — ED Notes (Signed)
 Stretcher switched out and sheets changed aft pt had an episode of dark bloody emesis

## 2023-09-12 DIAGNOSIS — Z515 Encounter for palliative care: Secondary | ICD-10-CM

## 2023-09-12 DIAGNOSIS — E43 Unspecified severe protein-calorie malnutrition: Secondary | ICD-10-CM

## 2023-09-12 DIAGNOSIS — R1013 Epigastric pain: Secondary | ICD-10-CM | POA: Diagnosis not present

## 2023-09-12 DIAGNOSIS — K56609 Unspecified intestinal obstruction, unspecified as to partial versus complete obstruction: Secondary | ICD-10-CM | POA: Diagnosis not present

## 2023-09-12 DIAGNOSIS — C181 Malignant neoplasm of appendix: Secondary | ICD-10-CM

## 2023-09-12 DIAGNOSIS — K922 Gastrointestinal hemorrhage, unspecified: Secondary | ICD-10-CM | POA: Diagnosis not present

## 2023-09-12 DIAGNOSIS — K920 Hematemesis: Secondary | ICD-10-CM

## 2023-09-12 LAB — BASIC METABOLIC PANEL
Anion gap: 11 (ref 5–15)
BUN: 22 mg/dL (ref 8–23)
CO2: 28 mmol/L (ref 22–32)
Calcium: 7.4 mg/dL — ABNORMAL LOW (ref 8.9–10.3)
Chloride: 99 mmol/L (ref 98–111)
Creatinine, Ser: 0.71 mg/dL (ref 0.61–1.24)
GFR, Estimated: >60 mL/min (ref >60)
Glucose, Bld: 87 mg/dL (ref 70–99)
Potassium: 3.4 mmol/L — ABNORMAL LOW (ref 3.5–5.1)
Sodium: 138 mmol/L (ref 135–145)

## 2023-09-12 LAB — PHOSPHORUS: Phosphorus: 2.5 mg/dL (ref 2.5–4.6)

## 2023-09-12 LAB — GLUCOSE, CAPILLARY: Glucose-Capillary: 68 mg/dL — ABNORMAL LOW (ref 70–99)

## 2023-09-12 LAB — MAGNESIUM: Magnesium: 2 mg/dL (ref 1.7–2.4)

## 2023-09-12 MED ORDER — HYDROMORPHONE HCL 1 MG/ML IJ SOLN
1.0000 mg | INTRAMUSCULAR | Status: DC | PRN
Start: 2023-09-12 — End: 2023-09-14
  Administered 2023-09-12 – 2023-09-14 (×11): 1 mg via INTRAVENOUS
  Filled 2023-09-12 (×11): qty 1

## 2023-09-12 MED ORDER — POTASSIUM CHLORIDE 10 MEQ/100ML IV SOLN
10.0000 meq | INTRAVENOUS | Status: AC
Start: 2023-09-12 — End: 2023-09-12
  Administered 2023-09-12 (×2): 10 meq via INTRAVENOUS
  Filled 2023-09-12 (×2): qty 100

## 2023-09-12 MED ORDER — MORPHINE SULFATE 10 MG/5ML PO SOLN
5.0000 mg | ORAL | Status: DC | PRN
  Administered 2023-09-12 – 2023-09-14 (×10): 5 mg via SUBLINGUAL
  Filled 2023-09-12 (×10): qty 5

## 2023-09-12 NOTE — Progress Notes (Signed)
 [facility] LIAISON NOTE  Received request from Patrick Edison, RN, Transitions of Care Manager, for hospice services at home after discharge. Spoke with patient Patrick Bailey, his father, Patrick Bailey., sister, Patrick Bailey and Brother in [place], Patrick Bailey at bedside to initiate education related to hospice philosophy, services, and team approach to care. Patient/family verbalized understanding of information given. Per discussion, the plan is for discharge home likely by private vehicle when medically cleared.   During my conversation with patient & family question arose about possibility of a venting peg at home for comfort.  Information shared with hospital medical team.  DME needs discussed.   Patient has the following equipment in the home:  by [facility] oxygen concentrator with portables and 3 in 1 chair. Owned:  walker and bed  Patient/family requests the following equipment for delivery:    Hospice Assessment RN to assess other DME upon initial visit.   The address has been verified and is correct in the chart.  Patient, Patrick Baileyis the family contact to arrange time of equipment delivery.  Please send signed and completed DNR home with patient/family if applicable.   Please provide prescriptions at discharge as needed to ensure ongoing symptom management.   [facility] information and contact numbers given to patient and above family members.   Above information shared with Patrick Bailey, Transitions of Care Manager and hospital medical care team.  Please call with any hospice related questions or concerns.  Thank you for the opportunity to participate in this patient's care.  Patrick HILARIO Na, MA, BSN, RN, FNE Nurse Liaison [phone]

## 2023-09-12 NOTE — Progress Notes (Signed)
 Patient is a yellow mews for HR of 113. going 107 to 113. Has 8/10 pain most of the time . Pain meds given as allowed.

## 2023-09-12 NOTE — Progress Notes (Signed)
 Progress Note   Patient: Patrick Bailey. FMW:[id number] DOB: 18-Jan-1961 DOA: 09/10/2023     2 DOS: the patient was seen and examined on 09/12/2023   Brief hospital course: [name] is a 63 year old male with history of appendiceal carcinoma with metastasis, peritoneal carcinomatosis, history of left CVA in December 2023, hypertension, hyperlipidemia, depression, anxiety, failure to thrive , GERD, hypothyroid, who presents emergency department for chief concerns of coffee-ground emesis. CT abdomen/pelvis showed small bowel obstructions at multiple levels, distended stomach. Patient condition is terminal, for comfort, patient was started on NG suction, IV fluids, and the pain medicine. After long discussion with the family, decision was made to transfer patient back to hospice.  Palliative care is working with hospice for discharge tomorrow.    Principal Problem:   Upper GI bleed Active Problems:   Alcohol abuse   Appendix carcinoma (HCC)   Abdominal pain   Protein-calorie malnutrition, severe   Essential hypertension   History of cerebellar stroke   GERD (gastroesophageal reflux disease)   HLD (hyperlipidemia)   Depression with anxiety   Hypokalemia   Hypomagnesemia   COPD (chronic obstructive pulmonary disease) (HCC)   Hypothyroidism   Hypertriglyceridemia   BPH (benign prostatic hyperplasia)   Immunocompromised (HCC)   SBO (small bowel obstruction) (HCC)   Failure to thrive  in adult   Rash   Assessment and Plan: Adenocarcinoma of appendix with metastasis. Small bowel obstruction secondary to cancer metastasis. Gastric outlet obstruction secondary to cancer metastasis. * Upper GI bleed  Acute blood loss anemia s/p PRBC. Patient has a terminal cancer, which has caused significant bowel obstruction.  Patient has a large amount of gastric content which caused discomfort.  NG tube placed to suction it out.  Afterwards, patient be only placed on liquid diet  tomorrow. Protonix  was started for GI bleed.  Patient also being seen by GI. Had a long discussion with patient and his sister, condition terminal.  There is no option for treatment.  Patient is dying from cancer.  We also talk about to not resuscitate status, but family and the patient does not want it. They insist to get IV fluids as patient has not been able to eat.  I explained to them that IV fluids will only slow down but not able to prevent the inevitable. Had a family meeting again, explained the futility of resuscitation, family had decided to go with DO NOT RESUSCITATE status. Discussed with palliative care, they will try to set up another agency for hospice.  At this point, I will continue pain medicine, another day of IV fluids for family request.  Will remove the NG tube, placed on liquid diet to see if patient can tolerate.   Hyponatremia. Hypokalemia. Severe hypomagnesemia. Give additional dose of potassium.   Essential hypertension Hold off all blood pressure medicines.   Hypothyroidism Hold off Synthroid .   Rash Appeared at IV site, appear to be improving.   Failure to thrive  in adult Severe protein calorie malnutrition. No treatment option.      Subjective:  Patient feels better today, still complaining of abdominal pain.  Physical Exam: Vitals:   09/11/23 2026 09/12/23 0034 09/12/23 0354 09/12/23 0846  BP: 110/81 114/82 119/85 113/84  Pulse: (!) 108 (!) 113 (!) 109 (!) 109  Resp: 16 18 20 16   Temp: 97.6 F (36.4 C) 98.1 F (36.7 C) 98.1 F (36.7 C) (!) 97.5 F (36.4 C)  TempSrc:      SpO2: 97% 92% 94% 91%  Weight:  Height:       General exam: Appears calm and comfortable, severely malnourished. Respiratory system: Clear to auscultation. Respiratory effort normal. Cardiovascular system: S1 & S2 heard, RRR. No JVD, murmurs, rubs, gallops or clicks. No pedal edema. Gastrointestinal system: Abdomen is nondistended, soft and nontender. No  organomegaly or masses felt. Normal bowel sounds heard. Central nervous system: Alert and oriented. No focal neurological deficits. Extremities: Symmetric 5 x 5 power. Skin: No rashes, lesions or ulcers Psychiatry: Mood & affect appropriate.    Data Reviewed:  Lab results reviewed.  Family Communication: Had a family meeting in patient room.  Disposition: Status is: Inpatient Remains inpatient appropriate because: Severity of disease, IV treatment, unsafe discharge option.     Time spent: 50 minutes  Author: Murvin Mana, MD 09/12/2023 11:53 AM  For on call review [email or web address].

## 2023-09-12 NOTE — Plan of Care (Signed)
  Problem: Education: Goal: Knowledge of General Education information will improve Description: Including pain rating scale, medication(s)/side effects and non-pharmacologic comfort measures Outcome: Progressing   Problem: Health Behavior/Discharge Planning: Goal: Ability to manage health-related needs will improve Outcome: Progressing   Problem: Clinical Measurements: Goal: Ability to maintain clinical measurements within normal limits will improve Outcome: Progressing Goal: Will remain free from infection Outcome: Progressing   Problem: Activity: Goal: Risk for activity intolerance will decrease Outcome: Progressing   Problem: Coping: Goal: Level of anxiety will decrease Outcome: Progressing   Problem: Elimination: Goal: Will not experience complications related to urinary retention Outcome: Progressing   Problem: Pain Managment: Goal: General experience of comfort will improve and/or be controlled Outcome: Progressing   Problem: Safety: Goal: Ability to remain free from injury will improve Outcome: Progressing   Problem: Skin Integrity: Goal: Risk for impaired skin integrity will decrease Outcome: Progressing

## 2023-09-12 NOTE — Consult Note (Addendum)
 Consultation Note Date: 09/12/2023   Patient Name: Patrick Bailey.  DOB: 20-May-1961  MRN: [id number]  Age / Sex: 63 y.o., male  PCP: Rilla Baller, MD Referring Physician: Laurita Pillion, MD  Reason for Consultation: Establishing goals of care   HPI/Brief Hospital Course: 63 y.o. male  with past medical history of appendiceal carcinoma with metastasis, peritoneal carcinomatosis, CVA, HTN, HLD, anxiety, depression, FTT and hypothyroidism admitted from home on 09/10/2023 with coffee-ground emesis. CT revealed SBO at multiple levels and distended stomach  GI consulted: deferring invasive interventions due to high risk for complications, recommend proceeding with conservative measures  Patient being followed at home with [facility] hosp[facility] due to ongoing progression and lack of treatment options related to her malignancy  Palliative medicine was consulted for assisting with goals of care conversations  Subjective:  Extensive chart review has been completed prior to meeting patient including labs, vital signs, imaging, progress notes, orders, and available advanced directive documents from current and previous encounters.  Visited with Patrick Bailey at his bedside. He is awake, alert with intermittent confusion. No family at bedside during my time of visit.  Introduced myself as a publishing rights manager as a member of the palliative care team. Explained palliative medicine is specialized medical care for people living with serious illness. It focuses on providing relief from the symptoms and stress of a serious illness. The goal is to improve quality of life for both the patient and the family.   Patrick Bailey complains of pain overnight that was not relieved, review of MAR primary team has adjusted dosing and frequency. Patrick Bailey shares he is feeling better this AM after adjustments have been made.  Patrick Bailey shares he lives at home but near his father and sister who  help care for him. He is not currently married and does not have children. He is able to share with me he knows he is in the hospital but unclear as to why and what the plan is. He requests I return to bedside once his sister and father are present to formulate a plan.  Returned to bedside once father and sister present in collaboration with Dr. Laurita. Dr. Laurita provides medical updates and shares interventions and treatment options are limited. At this time he recommends removal of NGT and initiating clear liquid diet but explains this could potentially worsen symptoms as blockage is still present. At this point recommendations are for comfort/hospice care.  Sister shares she is interested in switching hospice agencies and is interested in [facility] at home. She hopes to discuss possibilities of as needed IV fluid boluses at home if appropriate.  We discussed code status and the difference between Full Code and Do Not Resuscitate. Encouraged patient/family to consider DNR/DNI status understanding evidenced based poor outcomes in similar hospitalized patients, as the cause of the arrest is likely associated with chronic/terminal disease rather than a reversible acute cardio-pulmonary event.  Ms. Goehring clear that at this time he feels DNR/DNI is most appropriate.  TOC and [facility] hospice liaison engaged.  Discussions had with hospice team and primary team, concern for persistent obstruction with concern for symptom management. Recommendations for venting PEG, per GI unable to place due to high risk for complications related to anesthesia, primary team to reach out to IR.  All questions/concerns addressed. Emotional support provided to patient/family/support persons. PMT will continue to follow and support patient as needed.  Objective: Primary Diagnoses: Present on Admission:  Upper GI bleed  Protein-calorie malnutrition, severe  Hypothyroidism  Immunocompromised (HCC)  HLD (hyperlipidemia)   Hypertriglyceridemia  Essential hypertension  GERD (gastroesophageal reflux disease)  COPD (chronic obstructive pulmonary disease) (HCC)  Depression with anxiety  Appendix carcinoma (HCC)  BPH (benign prostatic hyperplasia)  Alcohol abuse  Abdominal pain  Hypokalemia  Hypomagnesemia   Physical Exam Constitutional:      General: He is not in acute distress.    Appearance: He is ill-appearing.  Pulmonary:     Effort: Pulmonary effort is normal. No respiratory distress.  Abdominal:     General: There is distension.     Tenderness: There is abdominal tenderness.  Skin:    General: Skin is warm and dry.  Neurological:     Mental Status: He is alert.     Motor: Weakness present.  Psychiatric:        Mood and Affect: Mood normal.        Thought Content: Thought content normal.     Vital Signs: BP 113/84 (BP Location: Right Arm)   Pulse (!) 109   Temp (!) 97.5 F (36.4 C)   Resp 16   Ht 6' (1.829 m)   Wt 70.3 kg   SpO2 91%   BMI 21.02 kg/m  Pain Scale: 0-10   Pain Score: Asleep  IO: Intake/output summary:  Intake/Output Summary (Last 24 hours) at 09/12/2023 1157 Last data filed at 09/12/2023 1103 Gross per 24 hour  Intake 2055.76 ml  Output 2000 ml  Net 55.76 ml    LBM: Last BM Date : 09/10/23 (per pt) Baseline Weight: Weight: 70.3 kg Most recent weight: Weight: 70.3 kg      Assessment and Plan  SUMMARY OF RECOMMENDATIONS   Home with hospice-[facility] DNR Venting PEG possibly to be placed by IR  Discussed With: Primary team and nursing staff   Thank you for this consult and allowing Palliative Medicine to participate in the care of Patrick R. Patrick Bailey. Palliative medicine will continue to follow and assist as needed.   Time Total: 75 minutes  Time spent includes: Detailed review of medical records (labs, imaging, vital signs), medically appropriate exam (mental status, respiratory, cardiac, skin), discussed with treatment team, counseling and educating  patient, family and staff, documenting clinical information, medication management and coordination of care.   Signed by: Waddell Lesches, DNP, AGNP-C Palliative Medicine    Please contact Palliative Medicine Team phone at [phone] for questions and concerns.  For individual provider: See Tracey

## 2023-09-12 NOTE — TOC Progression Note (Signed)
 Transition of Care (TOC) - Progression Note    Patient Details  Name: Patrick Bailey. MRN: [id number] Date of Birth: Aug 01, 1961  Transition of [facility] CM/SW Contact  Heron KATHEE Edison, RN Phone Number: 09/12/2023, 12:18 PM  Clinical Narrative: 2/9: Palliative following/consulting. Patient/family would like to proceed with evaluation via [facility] liaison. RN CM had left secure message earlier with [facility] hosp[facility] to contact this RN CM but have not yet had a call back, but will inform of family choice if call is returned. [facility] hospice hospital liaison aware of family choice.    Bing Edison MSN RN CM  RN Case Manager [place]  Transitions of Care Direct Dial: [phone] (Weekends Only) [facility] Main Office Phone: [phone] [facility] Fax: [phone] .[email or web address]         Expected Discharge Plan and Services                                               Social Determinants of Health (SDOH) Interventions SDOH Screenings   Food Insecurity: No Food Insecurity (09/11/2023)  Housing: Low Risk  (09/11/2023)  Transportation Needs: No Transportation Needs (09/11/2023)  Utilities: Not At Risk (09/11/2023)  Depression (PHQ2-9): Medium Risk (01/06/2022)  Financial Resource Strain: Low Risk  (12/15/2021)   Received from [facility], [facility]  Tobacco Use: Medium Risk (09/11/2023)  Health Literacy: Low Risk  (12/15/2021)   Received from [facility], [facility]    Readmission Risk Interventions    08/24/2023    3:35 PM 07/06/2023   10:49 AM  Readmission Risk Prevention Plan  Transportation Screening Complete Complete  Medication Review (RN Care Manager) Complete Complete  PCP or Specialist appointment within 3-5 days of discharge Complete Complete  HRI or Home Care Consult Complete Complete  SW Recovery Care/Counseling Consult Complete Complete  Palliative Care Screening Not Applicable Not Applicable  Skilled Nursing Facility Not  Applicable Not Complete  SNF Comments  patient not agreeable too SNF

## 2023-09-12 NOTE — TOC Progression Note (Signed)
 Transition of Care (TOC) - Progression Note    Patient Details  Name: Patrick Bailey. MRN: [id number] Date of Birth: 10/02/1960  Transition of Care [facility]) CM/SW Contact  Heron KATHEE Edison, RN Phone Number: 09/12/2023, 10:38 AM  Clinical Narrative:  2/9: RN CM left a VM with Chiquita Hussar [phone] as the hospice contact at [facility]. RN CM left number to call back to verify pta hospice at home per [facility] hospice per request from inpatient palliative following.    Bing Edison MSN RN CM  RN Case Manager [place]  Transitions of Care Direct Dial: [phone] (Weekends Only) [facility] Main Office Phone: [phone] [facility] Fax: [phone] [email or web address]          Expected Discharge Plan and Services                                               Social Determinants of Health (SDOH) Interventions SDOH Screenings   Food Insecurity: No Food Insecurity (09/11/2023)  Housing: Low Risk  (09/11/2023)  Transportation Needs: No Transportation Needs (09/11/2023)  Utilities: Not At Risk (09/11/2023)  Depression (PHQ2-9): Medium Risk (01/06/2022)  Financial Resource Strain: Low Risk  (12/15/2021)   Received from [facility], [facility]  Tobacco Use: Medium Risk (09/11/2023)  Health Literacy: Low Risk  (12/15/2021)   Received from [facility], [facility]    Readmission Risk Interventions    08/24/2023    3:35 PM 07/06/2023   10:49 AM  Readmission Risk Prevention Plan  Transportation Screening Complete Complete  Medication Review (RN Care Manager) Complete Complete  PCP or Specialist appointment within 3-5 days of discharge Complete Complete  HRI or Home Care Consult Complete Complete  SW Recovery Care/Counseling Consult Complete Complete  Palliative Care Screening Not Applicable Not Applicable  Skilled Nursing Facility Not Applicable Not Complete  SNF Comments  patient not agreeable too SNF

## 2023-09-13 ENCOUNTER — Encounter: Payer: Self-pay | Admitting: Family Medicine

## 2023-09-13 DIAGNOSIS — Z7189 Other specified counseling: Secondary | ICD-10-CM

## 2023-09-13 DIAGNOSIS — K922 Gastrointestinal hemorrhage, unspecified: Secondary | ICD-10-CM | POA: Diagnosis not present

## 2023-09-13 DIAGNOSIS — K56609 Unspecified intestinal obstruction, unspecified as to partial versus complete obstruction: Secondary | ICD-10-CM | POA: Diagnosis not present

## 2023-09-13 DIAGNOSIS — C181 Malignant neoplasm of appendix: Secondary | ICD-10-CM | POA: Diagnosis not present

## 2023-09-13 MED ORDER — POTASSIUM CHLORIDE IN NACL 20-0.9 MEQ/L-% IV SOLN: INTRAVENOUS | Status: DC

## 2023-09-13 MED ORDER — MORPHINE SULFATE 10 MG/5ML PO SOLN: 5.0000 mg | ORAL | 0 refills | Status: DC | PRN

## 2023-09-13 MED ORDER — MORPHINE SULFATE (CONCENTRATE) 20 MG/ML PO SOLN: 5.0000 mg | ORAL | 0 refills | Status: DC | PRN

## 2023-09-13 MED ORDER — POTASSIUM CHLORIDE IN NACL 20-0.9 MEQ/L-% IV SOLN
INTRAVENOUS | Status: DC
  Filled 2023-09-13 (×4): qty 1000

## 2023-09-13 NOTE — Progress Notes (Signed)
 Daily Progress Note   Patient Name: Patrick Bailey.       Date: 09/13/2023 DOB: July 26, 1961  Age: 63 y.o. MRN#: [id number] Attending Physician: Donaciano Frizzle, MD Primary Care Physician: Claire Crick, MD Admit Date: 09/10/2023  Reason for Consultation/Follow-up: Establishing goals of care  Subjective: Notes and labs reviewed. In to see patient. He is sitting in bed at this time. He has been updated and he discusses that he is aware he is not a candidate for a venting PEG.  He states he is hungry and wants to eat. He states he is eager to go home if everything is in place for D/C. Attending team, IR and other team members updated via epic chat.   Length of Stay: 3  Current Medications: Scheduled Meds:   Chlorhexidine  Gluconate Cloth  6 each Topical Daily   lidocaine   1 patch Transdermal Q24H   pantoprazole  (PROTONIX ) IV  40 mg Intravenous Q12H   sodium chloride  flush  10-40 mL Intracatheter Q12H    Continuous Infusions:  0.9 % NaCl with KCl 20 mEq / L 100 mL/hr at 09/13/23 0317    PRN Meds: acetaminophen  **OR** acetaminophen , hydrALAZINE , HYDROmorphone  (DILAUDID ) injection, morphine , ondansetron  **OR** ondansetron  (ZOFRAN ) IV, senna-docusate, sodium chloride  flush  Physical Exam Pulmonary:     Effort: Pulmonary effort is normal.  Neurological:     Mental Status: He is alert.             Vital Signs: BP (!) 130/102 (BP Location: Right Arm)   Pulse (!) 109   Temp 97.7 F (36.5 C)   Resp 16   Ht 6' (1.829 m)   Wt 70.3 kg   SpO2 91%   BMI 21.02 kg/m  SpO2: SpO2: 91 % O2 Device: O2 Device: Room Air O2 Flow Rate: O2 Flow Rate (L/min): 2 L/min  Intake/output summary:  Intake/Output Summary (Last 24 hours) at 09/13/2023 1018 Last data filed at 09/13/2023 0500 Gross per  24 hour  Intake 1396.6 ml  Output 1300 ml  Net 96.6 ml   LBM: Last BM Date : 09/11/23 (per pt) Baseline Weight: Weight: 70.3 kg Most recent weight: Weight: 70.3 kg   Patient Active Problem List   Diagnosis Date Noted   Upper GI bleed 09/10/2023   SBO (small bowel obstruction) (HCC) 09/10/2023   Failure  to thrive  in adult 09/10/2023   Rash 09/10/2023   Tachycardia 08/24/2023   Orthostatic hypotension 08/21/2023   Protein-calorie malnutrition, severe 08/20/2023   Acute urinary retention 08/20/2023   Anemia of chronic disease 08/20/2023   Multifocal pneumonia 08/20/2023   Acute respiratory failure with hypoxia (HCC) 08/20/2023   Palliative care encounter 08/19/2023   Septic shock (HCC) 08/19/2023   Hyponatremia 07/05/2023   Abdominal pain 07/04/2023   COPD (chronic obstructive pulmonary disease) (HCC) 07/04/2023   HLD (hyperlipidemia) 07/04/2023   Chronic diastolic CHF (congestive heart failure) (HCC) 07/04/2023   Electrolyte abnormality 07/04/2023   Hypocalcemia 07/04/2023   Nausea vomiting and diarrhea 07/04/2023   Depression with anxiety 07/04/2023   Asthmatic bronchitis 03/17/2023   Chronic diarrhea 10/14/2022   Drug-induced skin desquamation 10/14/2022   Peritoneal carcinomatosis (HCC) 10/14/2022   Malnutrition of moderate degree 09/30/2022   Ileus (HCC) 09/28/2022   SIRS (systemic inflammatory response syndrome) (HCC) 09/28/2022   Lactic acidosis 09/28/2022   UTI (urinary tract infection) 09/28/2022   AKI (acute kidney injury) (HCC) 09/28/2022   Hypokalemia 09/28/2022   Hypomagnesemia 09/28/2022   History of alcohol abuse 09/28/2022   Internal carotid artery stenosis, right 08/10/2022   Acute ischemic right MCA stroke (HCC) 07/01/2022   Impacted cerumen, right ear 05/08/2022   Penile ulcer 03/04/2022   Immunocompromised (HCC) 03/04/2022   Cyst of lumbar facet joint 03/04/2022   Advanced directives, counseling/discussion 01/08/2022   GERD (gastroesophageal  reflux disease) 01/08/2022   Synovial cyst of lumbar facet joint 01/08/2022   Appendix carcinoma (HCC) 12/01/2021   Other spondylosis with radiculopathy, lumbar region 11/06/2021   Facet hypertrophy of lumbar region 11/06/2021   Closed fracture of distal end of radius 03/10/2021   Left leg numbness 07/20/2020   BPH (benign prostatic hyperplasia) 01/26/2020   Central retinal artery occlusion of right eye 01/17/2020   Carotid stenosis 01/14/2020   Diastolic dysfunction 01/14/2020   Herpes labialis 01/05/2020   Hearing loss secondary to cerumen impaction, right 01/05/2020   S/P total knee arthroplasty 01/31/2019   Primary osteoarthritis of right knee 01/10/2019   Peripheral neuropathy 03/13/2018   Scalp lesion 08/04/2016   History of cerebellar stroke 07/17/2016   Hypertriglyceridemia 06/17/2015   Hypothyroidism    Degenerative arthritis of knee, bilateral 10/19/2014   Pseudogout 10/19/2014   Leg pain 08/10/2014   Essential hypertension 08/13/2010   Alcohol use 02/05/2009   TOBACCO ABUSE, HX OF 02/05/2009   Alcohol abuse 02/05/2009   History of oral cancer 11/01/2008   Anxiety state 07/24/2008   Chronic insomnia 07/24/2008   Shoulder joint pain 12/20/2006    Palliative Care Assessment & Plan    Recommendations/Plan: Patient discharging with hospice to follow.  PMT will sign off as goals are set.    Code Status:    Code Status Orders  (From admission, onward)           Start     Ordered   09/12/23 1151  Do not attempt resuscitation (DNR) Pre-Arrest Interventions Desired  (Code Status)  Continuous       Question Answer Comment  If pulseless and not breathing No CPR or chest compressions.   In Pre-Arrest Conditions (Patient Has Pulse and Is Breathing) May intubate, use advanced airway interventions and cardioversion/ACLS medications if appropriate or indicated. May transfer to ICU.   Consent: Discussion documented in EHR or advanced directives reviewed       09/12/23 1150           Code Status History  Date Active Date Inactive Code Status Order ID Comments User Context   09/10/2023 1519 09/12/2023 1150 Full Code [id number]  Cedric Cohn, DO ED   08/18/2023 1642 08/27/2023 2140 Full Code [id number]  Annitta Kindler, MD ED   07/04/2023 1931 07/06/2023 1918 Full Code [id number]  Fidencio Hue, MD ED   09/28/2022 1457 10/05/2022 2046 Full Code [id number]  Lena Qualia, MD ED   07/20/2022 0945 07/21/2022 0509 Full Code [id number]  Luellen Sages, MD HOV   07/01/2022 1312 07/02/2022 1439 Full Code [id number]  Cox, Amy N, DO ED   11/15/2021 0149 11/25/2021 2012 Full Code [id number]  Dorena Gander, MD ED   01/11/2020 1947 01/12/2020 2034 Full Code [id number]  Sabas Cradle, MD ED   01/31/2019 1319 02/01/2019 1432 Full Code [id number]  Carlie Chen III, PA-C Inpatient   08/24/2017 1727 08/25/2017 1514 Full Code [id number]  Patrisha Boot, PA-C Inpatient       Prognosis:  Poor   Thank you for allowing the Palliative Medicine Team to assist in the care of this patient.   Meribeth Standard, NP  Please contact Palliative Medicine Team phone at [phone] for questions and concerns.

## 2023-09-13 NOTE — TOC Transition Note (Signed)
 Transition of Care [facility]) - Discharge Note   Patient Details  Name: Patrick Bailey. MRN: [id number] Date of Birth: February 14, 1961  Transition of [facility]) CM/SW Contact:  Odilia Bennett, LCSW Phone Number: 09/13/2023, 2:07 PM   Clinical Narrative:  Patient has orders to discharge home today. [facility] liaison is aware. Per RN, family will transport. No further concerns. CSW signing off.   Final next level of care: Home w Hospice Care Barriers to Discharge: Barriers Resolved   Patient Goals and CMS Choice            Discharge Placement                Patient to be transferred to facility by: Family   Patient and family notified of of transfer: 09/13/23  Discharge Plan and Services Additional resources added to the After Visit Summary for                                       Social Drivers of Health (SDOH) Interventions SDOH Screenings   Food Insecurity: No Food Insecurity (09/11/2023)  Housing: Low Risk  (09/11/2023)  Transportation Needs: No Transportation Needs (09/11/2023)  Utilities: Not At Risk (09/11/2023)  Depression (PHQ2-9): Medium Risk (01/06/2022)  Financial Resource Strain: Low Risk  (12/15/2021)   Received from [facility], [facility]  Tobacco Use: Medium Risk (09/11/2023)  Health Literacy: Low Risk  (12/15/2021)   [facility]     Readmission Risk Interventions    08/24/2023    3:35 PM 07/06/2023   10:49 AM  Readmission Risk Prevention Plan  Transportation Screening Complete Complete  Medication Review (RN Care Manager) Complete Complete  PCP or Specialist appointment within 3-5 days of discharge Complete Complete  HRI or Home Care Consult Complete Complete  SW Recovery Care/Counseling Consult Complete Complete  Palliative Care Screening Not Applicable Not Applicable  Skilled Nursing Facility Not Applicable Not Complete  SNF Comments  patient not agreeable too SNF

## 2023-09-13 NOTE — Progress Notes (Signed)
 IR asked to evaluate patient for possible venting gastrostomy tube. Imaging reviewed by Dr. Julietta Ogren with recommendations against g-tube placement due to the stomach looking thick and abnormal on CT. This was discussed with the primary team via epic chat.   No IR procedure planned and the order will be deleted.   Jetta Morrow, AGACNP-BC 09/13/2023, 9:27 AM

## 2023-09-13 NOTE — Plan of Care (Signed)
  Problem: Clinical Measurements: Goal: Ability to maintain clinical measurements within normal limits will improve Outcome: Not Progressing   Problem: Activity: Goal: Risk for activity intolerance will decrease Outcome: Not Progressing   

## 2023-09-13 NOTE — Discharge Summary (Signed)
 Physician Discharge Summary   Patient: Patrick Bailey. MRN: [id number] DOB: 1961/03/28  Admit date:     09/10/2023  Discharge date: 09/13/23  Discharge Physician: Donaciano Frizzle   PCP: Claire Crick, MD   Recommendations at discharge:   Follow-up with hospice as outpatient.  Discharge Diagnoses: Principal Problem:   Upper GI bleed Active Problems:   Alcohol abuse   Appendix carcinoma (HCC)   Abdominal pain   Protein-calorie malnutrition, severe   Essential hypertension   History of cerebellar stroke   GERD (gastroesophageal reflux disease)   HLD (hyperlipidemia)   Depression with anxiety   Hypokalemia   Hypomagnesemia   COPD (chronic obstructive pulmonary disease) (HCC)   Hypothyroidism   Hypertriglyceridemia   BPH (benign prostatic hyperplasia)   Immunocompromised (HCC)   SBO (small bowel obstruction) (HCC)   Failure to thrive  in adult   Rash  Resolved Problems:   * No resolved hospital problems. [facility] Course: Patrick Bailey is a 63 year old male with history of appendiceal carcinoma with metastasis, peritoneal carcinomatosis, history of left CVA in December 2023, hypertension, hyperlipidemia, depression, anxiety, failure to thrive , GERD, hypothyroid, who presents emergency department for chief concerns of coffee-ground emesis. CT abdomen/pelvis showed small bowel obstructions at multiple levels, distended stomach. Patient condition is terminal, for comfort, patient was started on NG suction, IV fluids, and the pain medicine. After long discussion with the family, decision was made to transfer patient back to hospice.   Patient is made a DNR.  Patient will not be able to eat, will attempt to place to vent PEG, but does not seem to be appropriate for GI and IR.  At this point, does not seem to offer, patient will be discharged home with hospice.   Assessment and Plan: Adenocarcinoma of appendix with metastasis. Small bowel obstruction secondary to cancer  metastasis. Gastric outlet obstruction secondary to cancer metastasis. * Upper GI bleed  Acute blood loss anemia s/p PRBC. Patient has a terminal cancer, which has caused significant bowel obstruction.  Patient has a large amount of gastric content which caused discomfort.  NG tube placed to suction it out.  Afterwards, patient be only placed on liquid diet tomorrow. Protonix  was started for GI bleed.  Patient also being seen by GI. Had a long discussion with patient and his sister, condition terminal.  There is no option for treatment.  Patient is dying from cancer.  We also talk about to not resuscitate status, but family and the patient does not want it. They insist to get IV fluids as patient has not been able to eat.  I explained to them that IV fluids will only slow down but not able to prevent the inevitable. Had a family meeting again, explained the futility of resuscitation, family had decided to go with DO NOT RESUSCITATE status. Discussed with palliative care, they will try to set up another agency for hospice.  We also tried to place the vent PEG, but it is determined not safe to perform. At this point, patient is set to go home with hospice.  Anticipating readmission to hospice versus the hospital pretty soon. Patient also likes to eat, but at this point, it is not possible.  I did order clear liquid diet for comfort.   Hyponatremia. Hypokalemia. Severe hypomagnesemia. Repleted potassium magnesium .  No additional treatment is needed.   Essential hypertension Hold off all blood pressure medicines.   Hypothyroidism Hold off Synthroid .   Rash Appeared at IV site, appear to be  improving.   Failure to thrive  in adult Severe protein calorie malnutrition. Hypoglycemia secondary to poor p.o. intake. No treatment option.       Consultants: IR and GI. Procedures performed: None  Disposition: Hospice care Diet recommendation:  Discharge Diet Orders (From admission, onward)      Start     Ordered   09/13/23 0000  Diet clear liquid        09/13/23 1033           Clear liquid diet DISCHARGE MEDICATION: Allergies as of 09/13/2023       Reactions   Hydrocodone  Hives   Per pt able to tolerate hydromorphone         Medication List     STOP taking these medications    alum & mag hydroxide-simeth 200-200-20 MG/5ML suspension Commonly known as: MAALOX/MYLANTA   amitriptyline  100 MG tablet Commonly known as: ELAVIL    amLODipine  10 MG tablet Commonly known as: NORVASC    aspirin  EC 81 MG tablet   atorvastatin  40 MG tablet Commonly known as: LIPITOR   calcium  carbonate 500 MG chewable tablet Commonly known as: TUMS - dosed in mg elemental calcium    cephALEXin 500 MG capsule Commonly known as: KEFLEX   clopidogrel  75 MG tablet Commonly known as: PLAVIX    dexamethasone  2 MG tablet Commonly known as: DECADRON    diphenoxylate-atropine  2.5-0.025 MG tablet Commonly known as: LOMOTIL   docusate sodium  100 MG capsule Commonly known as: COLACE   fenofibrate  160 MG tablet   finasteride  5 MG tablet Commonly known as: PROSCAR    gabapentin  400 MG capsule Commonly known as: NEURONTIN    HYDROmorphone  4 MG tablet Commonly known as: DILAUDID    levothyroxine  150 MCG tablet Commonly known as: SYNTHROID    lipase/protease/amylase 78295 UNITS Cpep capsule Commonly known as: Creon    Lonsurf 15-6.14 MG tablet Generic drug: trifluridine-tipiracil   MAGnesium -Oxide 400 (240 Mg) MG tablet Generic drug: magnesium  oxide   methocarbamol  500 MG tablet Commonly known as: ROBAXIN    metoCLOPramide  10 MG tablet Commonly known as: REGLAN    midodrine  10 MG tablet Commonly known as: PROAMATINE    Mitigare  0.6 MG Caps Generic drug: Colchicine    multivitamin with minerals Tabs tablet   naloxone  4 MG/0.1ML Liqd nasal spray kit Commonly known as: NARCAN    ofloxacin  0.3 % OTIC solution Commonly known as: FLOXIN    ondansetron  4 MG  tablet Commonly known as: Zofran    Oxycodone  HCl 20 MG Tabs   pantoprazole  40 MG tablet Commonly known as: PROTONIX    polyethylene glycol 17 g packet Commonly known as: MIRALAX  / GLYCOLAX    potassium chloride  10 MEQ tablet Commonly known as: KLOR-CON    prochlorperazine 10 MG tablet Commonly known as: COMPAZINE   sodium chloride  1 g tablet       TAKE these medications    morphine  10 MG/5ML solution Place 2.5 mLs (5 mg total) under the tongue every 2 (two) hours as needed for severe pain (pain score 7-10).        Discharge Exam: Filed Weights   09/11/23 0800  Weight: 70.3 kg   General exam: Appears calm and comfortable, severely malnourished. Respiratory system: Clear to auscultation. Respiratory effort normal. Cardiovascular system: S1 & S2 heard, RRR. No JVD, murmurs, rubs, gallops or clicks. No pedal edema. Gastrointestinal system: Abdomen is nondistended, soft and nontender. No organomegaly or masses felt. Normal bowel sounds heard. Central nervous system: Alert and oriented. No focal neurological deficits. Extremities: Symmetric 5 x 5 power. Skin: No rashes, lesions or ulcers Psychiatry: Judgement and  insight appear normal. Mood & affect appropriate.    Condition at discharge: poor  The results of significant diagnostics from this hospitalization (including imaging, microbiology, ancillary and laboratory) are listed below for reference.   Imaging Studies: DG Abd Portable 1V Result Date: 09/11/2023 CLINICAL DATA:  [id number] Encounter for nasogastric (NG) tube placement [id number] EXAM: PORTABLE ABDOMEN - 1 VIEW COMPARISON:  08/18/2023 abdominal radiograph FINDINGS: Enteric tube terminates in proximal stomach with side port at the esophagogastric junction. Surgical clip in the left upper abdomen. Moderate colonic gas. No dilated small bowel loops, pneumatosis or pneumoperitoneum in the upper abdomen. Patchy opacities at both lung bases. Superior approach central venous  catheter terminates over the cavoatrial junction. Marked thoracolumbar spondylosis. IMPRESSION: Enteric tube terminates in the proximal stomach with side port at the esophagogastric junction, consider 3-5 cm advancement. Patchy opacities at both lung bases, consider chest radiograph correlation. Electronically Signed   By: Levell Reach M.D.   On: 09/11/2023 12:04   CT ANGIO GI BLEED Result Date: 09/10/2023 CLINICAL DATA:  Hematemesis. History of metastatic mucinous appendiceal adenocarcinoma. Coffee ground emesis for 1 week. * Tracking Code: BO * EXAM: CTA ABDOMEN AND PELVIS WITHOUT AND WITH CONTRAST TECHNIQUE: Multidetector CT imaging of the abdomen and pelvis was performed using the standard protocol during bolus administration of intravenous contrast. Multiplanar reconstructed images and MIPs were obtained and reviewed to evaluate the vascular anatomy. RADIATION DOSE REDUCTION: This exam was performed according to the departmental dose-optimization program which includes automated exposure control, adjustment of the mA and/or kV according to patient size and/or use of iterative reconstruction technique. CONTRAST:  OMNIPAQUE  IOHEXOL  350 MG/ML SOLN COMPARISON:  Abdomen pelvis CT 08/18/2023. FINDINGS: VASCULAR Aorta: Normal caliber aorta without aneurysm, dissection, vasculitis or significant stenosis. Mild to moderate atherosclerotic calcification. Celiac: Patent without evidence of aneurysm, dissection, vasculitis or significant stenosis. SMA: Patent without evidence of aneurysm, dissection, vasculitis or significant stenosis. Renals: Both renal arteries are patent without evidence of aneurysm, dissection, vasculitis, fibromuscular dysplasia or significant stenosis. IMA: Patent without evidence of aneurysm, dissection, vasculitis or significant stenosis. Inflow: Patent without evidence of aneurysm, dissection, vasculitis or significant stenosis. Proximal Outflow: Bilateral common femoral and visualized  portions of the superficial and profunda femoral arteries are patent without evidence of aneurysm, dissection, vasculitis or significant stenosis. Veins: No obvious venous abnormality within the limitations of this arterial phase study. Review of the MIP images confirms the above findings. NON-VASCULAR Lower chest: Fluid in the distal esophagus may be related to reflux or dysmotility. Diffuse bronchial wall thickening noted with innumerable tiny bilateral pulmonary nodules, right greater than left. There is consolidative airspace disease in the posterior right lower lobe with small right pleural effusion. The patchy basilar airspace disease was worse on a previous chest CTA from 08/24/2023. Hepatobiliary: No suspicious focal abnormality within the liver parenchyma. 11 mm ill-defined subtle hypodense lesion in the posterior right liver (35/13 was not visible on the previous exam performed without intravenous contrast but is similar to a postcontrast CT of 07/04/2023. Subcapsular 19 x 10 mm posterior right hepatic lobe lesion on 29/13 was 29 x 22 mm previously (remeasured). Gallbladder is distended with some fluid around the gallbladder, nonspecific. Mild intrahepatic biliary duct prominence with no substantial extrahepatic biliary duct dilatation. Pancreas: Diffuse parenchymal atrophy without main duct dilatation. Spleen: No splenomegaly. No suspicious focal mass lesion. Adrenals/Urinary Tract: No adrenal nodule or mass. Kidneys unremarkable. No evidence for hydroureter. Layering high density material is seen in the posterior right bladder lumen, potentially  excreted contrast material although bladder stones not excluded. Stomach/Bowel: Stomach is markedly distended with food and fluid. Wall thickening noted in the distal stomach/antral region. Duodenum is fluid-filled and distended. Small bowel loops in the left abdomen measure up to 5.3 cm diameter. Diffuse small bowel wall thickening is noted in the abdomen with  vascular congestion and areas of matted small bowel obscuring mesenteric fat in the central abdomen and upper pelvis. Right lower quadrant loculated rim enhancing fluid collection is similar to prior measuring 4.1 x 9.6 x 8.0 cm today (image 61/13). Ileocolic anastomosis noted anterior abdomen. Large colonic stool volume evident. No evidence for tear old phase contrast extravasation into the stomach or duodenum. No definite contrast extravasation in the small bowel or colon. Lymphatic: No retroperitoneal lymphadenopathy. No pelvic sidewall lymphadenopathy. Reproductive: The prostate gland and seminal vesicles are unremarkable. Other: Nodularity lung the anterior liver margin is compatible with metastatic disease. The diffuse ill-defined amorphous soft tissue in the central mesentery appears progressive in the interval. This may be related to metastatic disease or edema from potential small bowel obstruction. Musculoskeletal: No worrisome lytic or sclerotic osseous abnormality. Body wall edema noted lower abdomen and pelvis. Bilateral groin hernias contain only fat. IMPRESSION: 1. No evidence for arterial phase contrast extravasation in the stomach, small bowel, or colon to suggest active arterial bleeding. 2. Marked distention of the stomach with food and fluid. There is prominent wall thickening in the distal stomach with luminal narrowing. Imaging features raise concern for a component of gastric outlet obstruction. Lower esophagus is fluid-filled suggesting reflux. 3. Duodenum is fluid-filled and distended. Small bowel loops in the left abdomen measure up to 5.3 cm diameter although other small bowel loops are decompressed. Diffuse small bowel wall thickening is noted in the abdomen with vascular congestion and areas of matted small bowel obscuring mesenteric fat in the central abdomen and upper pelvis. Diffuse ill-defined amorphous soft tissue in the central mesentery appears progressive in the interval. Imaging  features compatible with metastatic disease. Given the markedly dilated small bowel loop in the left abdomen, small-bowel obstruction including a closed loop etiology cannot be excluded. 4. Right lower quadrant loculated rim enhancing fluid collection is similar to prior measuring 4.1 x 9.6 x 8.0 cm today. This could represent loculated ascites secondary to peritoneal/mesenteric metastatic disease. Abscess cannot be excluded. 5. Diffuse bronchial wall thickening with innumerable tiny bilateral pulmonary nodules, right greater than left. Imaging features/inflammatory etiology or metastatic involvement. There was prominent airspace disease in both lung bases on previous chest CT of 08/24/2023 and this has improved in the interval. 6. Small right pleural effusion. 7. Layering high density material in the posterior right bladder lumen, potentially excreted contrast material although bladder stones not excluded. 8. 11 mm ill-defined subtle hypodense lesion in the posterior right liver (35/13 was not visible on the previous exam performed without intravenous contrast but is similar to a postcontrast CT of 07/04/2023. Posterior right capsular lesion along the liver with soft tissue nodularity along the anterior liver margin. Findings compatible with metastatic disease. 9.  Aortic Atherosclerosis (ICD10-I70.0). Electronically Signed   By: Donnal Fusi M.D.   On: 09/10/2023 16:05   CT Angio Chest Pulmonary Embolism (PE) W or WO Contrast Result Date: 08/24/2023 CLINICAL DATA:  Mid chest pain, short of breath, negative D-dimer, history of colon cancer and peritoneal carcinomatosis EXAM: CT ANGIOGRAPHY CHEST WITH CONTRAST TECHNIQUE: Multidetector CT imaging of the chest was performed using the standard protocol during bolus administration of intravenous contrast. Multiplanar  CT image reconstructions and MIPs were obtained to evaluate the vascular anatomy. RADIATION DOSE REDUCTION: This exam was performed according to the  departmental dose-optimization program which includes automated exposure control, adjustment of the mA and/or kV according to patient size and/or use of iterative reconstruction technique. CONTRAST:  75mL OMNIPAQUE  IOHEXOL  350 MG/ML SOLN COMPARISON:  11/21/2021, 08/18/2023 FINDINGS: Cardiovascular: This is a technically adequate evaluation of the pulmonary vasculature. No filling defects or pulmonary emboli. The heart is unremarkable without pericardial effusion. Aneurysm of the ascending thoracic aorta and aortic arch measuring up to 4 cm. Evaluation of the aortic lumen is limited due to timing of contrast bolus. There is atherosclerosis throughout the aorta and coronary vasculature. Right chest wall port via internal jugular approach tip within the superior vena cava. Mediastinum/Nodes: Fluid-filled esophagus likely due to reflux given the degree of gastric distension. Thyroid  and trachea are unremarkable. Enlarged subcarinal lymph node measuring up to 13 mm, likely reactive. No other pathologic adenopathy. Lungs/Pleura: There is multifocal bilateral airspace disease, most pronounced within the lower lobes, not significantly changed since prior CT. There are small bilateral pleural effusions, right greater than left. No pneumothorax. Stable emphysema. Central airways are patent. Upper Abdomen: Moderate free fluid within the left upper quadrant. Soft tissue stranding within the omentum consistent with previous history of peritoneal carcinomatosis. 3 cm capsular hypodensity along the inferolateral margin of the right lobe liver image 145/4 is not appreciably changed since prior study, but has developed since 09/28/2022 exam, concerning for capsular metastatic implant. Musculoskeletal: No acute or destructive bony abnormalities. Reconstructed images demonstrate no additional findings. Review of the MIP images confirms the above findings. IMPRESSION: 1. No evidence of pulmonary embolus. 2. Bilateral airspace disease,  greatest within the lower lobes, consistent with multifocal pneumonia or widespread aspiration. 3. Small bilateral pleural effusions, right greater than left, new since recent abdominal CT. 4. Upper abdominal findings consistent with metastatic disease and peritoneal carcinomatosis. New free fluid within the left upper quadrant since recent abdominal CT. 5. 4 cm thoracic aortic aneurysm involving the ascending aorta and aortic arch. Recommend semi-annual imaging followup by CTA or MRA and referral to cardiothoracic surgery if not already obtained. This recommendation follows 2010 ACCF/AHA/AATS/ACR/ASA/SCA/SCAI/SIR/STS/SVM Guidelines for the Diagnosis and Management of Patients With Thoracic Aortic Disease. Circulation. 2010; 121: J884-Z66. Aortic aneurysm NOS (ICD10-I71.9) 6. Aortic Atherosclerosis (ICD10-I70.0) and Emphysema (ICD10-J43.9). 7. Subcarinal adenopathy, likely reactive. Electronically Signed   By: Bobbye Burrow M.D.   On: 08/24/2023 22:08   MR BRAIN WO CONTRAST Result Date: 08/24/2023 CLINICAL DATA:  Initial evaluation for acute neuro deficit, stroke suspected. EXAM: MRI HEAD WITHOUT CONTRAST TECHNIQUE: Multiplanar, multiecho pulse sequences of the brain and surrounding structures were obtained without intravenous contrast. COMPARISON:  Prior brain MRI from 07/08/2023. FINDINGS: Brain: Cerebral volume within normal limits for age. Patchy T2/FLAIR hyperintensity involving the periventricular deep white matter both cerebral hemispheres, consistent with chronic small vessel ischemic disease, mild in nature. No evidence for acute or subacute ischemia. Gray-white matter differentiation maintained. No areas of chronic cortical infarction. No acute intracranial hemorrhage. Scattered areas of superficial siderosis noted about a few cortical sulci of the right cerebral hemisphere, most notably about the central sulcus (series 10, image 53). Findings consistent with prior subarachnoid hemorrhage at these  locations, stable from prior. Few additional punctate chronic micro hemorrhages noted. No mass lesion, midline shift or mass effect. No hydrocephalus or extra-axial fluid collection. Pituitary gland and suprasellar region within normal limits. Vascular: Loss of normal flow void within the  hypoplastic right vertebral artery, which could be related to slow flow and/or occlusion, stable from prior. Major intracranial vascular flow voids are otherwise maintained. Skull and upper cervical spine: Craniocervical junction within normal limits. Bone marrow signal intensity normal. No scalp soft tissue abnormality. Sinuses/Orbits: Globes and orbital soft tissues within normal limits. Mild mucosal thickening noted about the ethmoidal air cells and maxillary sinuses. Small right mastoid effusion, similar to prior, and of doubtful significance. Negative nasopharynx. Other: None. IMPRESSION: 1. No acute intracranial abnormality. 2. Mild chronic microvascular ischemic disease for age. 3. Scattered areas of superficial siderosis about a few cortical sulci of the right cerebral hemisphere, consistent with prior subarachnoid hemorrhage, stable from prior. 4. Loss of normal flow void within the hypoplastic right vertebral artery, which could be related to slow flow and/or occlusion, also stable from prior. Electronically Signed   By: Virgia Griffins M.D.   On: 08/24/2023 04:42   US  Venous Img Lower Unilateral Left (DVT) Result Date: 08/19/2023 CLINICAL DATA:  10026 Shortness of breath 10026. EXAM: Left LOWER EXTREMITY VENOUS DOPPLER ULTRASOUND TECHNIQUE: Gray-scale sonography with compression, as well as color and duplex ultrasound, were performed to evaluate the deep venous system(s) from the level of the common femoral vein through the popliteal and proximal calf veins. COMPARISON:  Ultrasound bilateral lower extremity venous Doppler 07/01/2022 FINDINGS: VENOUS Normal compressibility of the common femoral, superficial  femoral, and popliteal veins, as well as the visualized calf veins. Visualized portions of profunda femoral vein and great saphenous vein unremarkable. No filling defects to suggest DVT on grayscale or color Doppler imaging. Doppler waveforms show normal direction of venous flow, normal respiratory plasticity and response to augmentation. OTHER None. Limitations: none IMPRESSION: Negative. Electronically Signed   By: Morgane  Naveau M.D.   On: 08/19/2023 20:22   DG Abd 1 View Result Date: 08/18/2023 CLINICAL DATA:  NG tube placement EXAM: ABDOMEN - 1 VIEW COMPARISON:  CT 08/18/2023 FINDINGS: Enteric tube tip at the mid gastric region, side-port near GE junction. Air distension of bowel in the left upper quadrant IMPRESSION: Enteric tube tip at the mid gastric region, side-port near GE junction, further advancement could be considered for more optimal positioning. Electronically Signed   By: Esmeralda Hedge M.D.   On: 08/18/2023 21:01   CT ABDOMEN PELVIS WO CONTRAST Result Date: 08/18/2023 CLINICAL DATA:  Sepsis. Nausea and vomiting. Hypotension. One-week history of shortness of breath, weakness, constipation, abdominal pain, nausea, vomiting, and diarrhea. History of prostate cancer. EXAM: CT ABDOMEN AND PELVIS WITHOUT CONTRAST TECHNIQUE: Multidetector CT imaging of the abdomen and pelvis was performed following the standard protocol without IV contrast. RADIATION DOSE REDUCTION: This exam was performed according to the departmental dose-optimization program which includes automated exposure control, adjustment of the mA and/or kV according to patient size and/or use of iterative reconstruction technique. COMPARISON:  07/04/2023 FINDINGS: Lower chest: Dense airspace consolidation in both lung bases, new since prior study. This is likely to represent multifocal pneumonia. Aspiration would be a secondary consideration. The esophagus is fluid-filled with mild distention. This may be due to reflux or dysmotility.  Hepatobiliary: Focal liver lesion in segment 6 measuring 2.5 cm diameter. This is better characterized on the previous contrast-enhanced study, likely representing a cyst. Increased density in the gallbladder could represent stones or sludge. No gallbladder wall thickening. No bile duct dilatation. Pancreas: Unremarkable. No pancreatic ductal dilatation or surrounding inflammatory changes. Spleen: Normal in size without focal abnormality. Adrenals/Urinary Tract: Adrenal glands are unremarkable. Kidneys are normal, without renal  calculi, focal lesion, or hydronephrosis. Bladder is unremarkable. Stomach/Bowel: Stomach is distended with fluid. Suggestion of mild gastric wall thickening possibly gastritis. Small bowel are mostly decompressed with a few scattered gas-filled small bowel loops. Anastomosis in the anterior mid abdomen likely represents an ileocolic anastomosis. The colon is mildly distended with gas. There is mild gastric wall thickening which may indicate colitis. Vascular/Lymphatic: Calcification of the aorta. Scattered lymph nodes in the porta hepatis and gastrohepatic ligament are not pathologically enlarged, likely reactive. Reproductive: No pelvic mass or lymphadenopathy. Other: Loculated fluid collection in the right lower quadrant measuring up to about 5 x 10.1 cm. Collection appears roughly similar to the prior study. Decreased collection in the left pericolic gutter region since prior study. Decreased free fluid in the abdomen and pelvis. No free air identified. Small right inguinal hernia containing fat. Calcification and scarring in the anterior abdominal wall is likely postoperative Musculoskeletal: Degenerative changes in the spine and hips. No acute bony abnormalities. IMPRESSION: 1. New development of dense consolidation in both lung bases likely representing multifocal pneumonia or possibly aspiration. 2. Stomach is distended with fluid. Gastric wall thickening suggesting gastritis.  Fluid-filled lower esophagus may indicate reflux disease or esophagitis. 3. Gaseous distention of colon is progressing since prior study. Colonic wall thickening may indicate colitis. 4. Loculated fluid collection in the right lower quadrant is similar to prior study, possibly an abscess. Free fluid and left pericolic gutter fluid is decreasing since prior study. 5. Aortic atherosclerosis. Electronically Signed   By: Boyce Byes M.D.   On: 08/18/2023 18:18   DG Chest Portable 1 View Result Date: 08/18/2023 CLINICAL DATA:  63 year old male with possible sepsis. Weakness, shortness of breath, abdominal pain. History of prostate cancer. Central line placement. EXAM: PORTABLE CHEST 1 VIEW COMPARISON:  Portable chest 1257 hours today. FINDINGS: Portable AP semi upright view at 1510 hours. New left IJ approach central line in place, tip projects at the central line placement. Innominate vein confluence, upper SVC. Stable pre-existing right chest power port. No pneumothorax. Ongoing abnormal confluent right lung base opacity, and on this image the right hilum also appears abnormally enlarged, opacified. Lung markings and mediastinal contours elsewhere within normal limits. No pleural effusion. Stable visualized osseous structures. Negative visible bowel gas. IMPRESSION: 1. Left IJ central line placed, tip at the innominate vein confluence, upper SVC. No pneumothorax. 2. Abnormal right perihilar and right lung base confluent opacity suspicious for pneumonia, aspiration in this setting. No pleural effusion. Electronically Signed   By: Marlise Simpers M.D.   On: 08/18/2023 15:24   DG Chest Port 1 View Result Date: 08/18/2023 CLINICAL DATA:  63 year old male with possible sepsis. Weakness, shortness of breath, abdominal pain. History of prostate cancer. EXAM: PORTABLE CHEST 1 VIEW COMPARISON:  Portable chest 10/02/2022 and earlier. FINDINGS: Portable AP semi upright view at 1257 hours. Similar patient rotation to the right.  Chronic right chest Port-A-Cath. Stable lung volumes and mediastinal contours. No pneumothorax, pulmonary edema, pleural effusion. But new patchy, reticulonodular right lung base opacity since last year. Similar bilateral upper lung predominant opacities at that time appear resolved. Small surgical clips at the right thoracic inlet. No acute osseous abnormality identified. IMPRESSION: Patchy right lung base opacity since last year suspicious for aspiration or pneumonia in this setting. No pleural effusion. Electronically Signed   By: Marlise Simpers M.D.   On: 08/18/2023 15:22    Microbiology: Results for orders placed or performed during the hospital encounter of 09/10/23  Resp panel by RT-PCR (  RSV, Flu A&B, Covid) Anterior Nasal Swab     Status: None   Collection Time: 09/10/23  2:43 PM   Specimen: Anterior Nasal Swab  Result Value Ref Range Status   SARS Coronavirus 2 by RT PCR NEGATIVE NEGATIVE Final    Comment: (NOTE) SARS-CoV-2 target nucleic acids are NOT DETECTED.  The SARS-CoV-2 RNA is generally detectable in upper respiratory specimens during the acute phase of infection. The lowest concentration of SARS-CoV-2 viral copies this assay can detect is 138 copies/mL. A negative result does not preclude SARS-Cov-2 infection and should not be used as the sole basis for treatment or other patient management decisions. A negative result may occur with  improper specimen collection/handling, submission of specimen other than nasopharyngeal swab, presence of viral mutation(s) within the areas targeted by this assay, and inadequate number of viral copies(<138 copies/mL). A negative result must be combined with clinical observations, patient history, and epidemiological information. The expected result is Negative.  Fact Sheet for Patients:  BloggerCourse.com  Fact Sheet for Healthcare Providers:  SeriousBroker.it  This test is no t yet approved  or cleared by the [place]  FDA and  has been authorized for detection and/or diagnosis of SARS-CoV-2 by FDA under an Emergency Use Authorization (EUA). This EUA will remain  in effect (meaning this test can be used) for the duration of the COVID-19 declaration under Section 564(b)(1) of the Act, 21 U.S.C.section 360bbb-3(b)(1), unless the authorization is terminated  or revoked sooner.       Influenza A by PCR NEGATIVE NEGATIVE Final   Influenza B by PCR NEGATIVE NEGATIVE Final    Comment: (NOTE) The Xpert Xpress SARS-CoV-2/FLU/RSV plus assay is intended as an aid in the diagnosis of influenza from Nasopharyngeal swab specimens and should not be used as a sole basis for treatment. Nasal washings and aspirates are unacceptable for Xpert Xpress SARS-CoV-2/FLU/RSV testing.  Fact Sheet for Patients: BloggerCourse.com  Fact Sheet for Healthcare Providers: SeriousBroker.it  This test is not yet approved or cleared by the [place]  FDA and has been authorized for detection and/or diagnosis of SARS-CoV-2 by FDA under an Emergency Use Authorization (EUA). This EUA will remain in effect (meaning this test can be used) for the duration of the COVID-19 declaration under Section 564(b)(1) of the Act, 21 U.S.C. section 360bbb-3(b)(1), unless the authorization is terminated or revoked.     Resp Syncytial Virus by PCR NEGATIVE NEGATIVE Final    Comment: (NOTE) Fact Sheet for Patients: BloggerCourse.com  Fact Sheet for Healthcare Providers: SeriousBroker.it  This test is not yet approved or cleared by the [place]  FDA and has been authorized for detection and/or diagnosis of SARS-CoV-2 by FDA under an Emergency Use Authorization (EUA). This EUA will remain in effect (meaning this test can be used) for the duration of the COVID-19 declaration under Section 564(b)(1) of the Act,  21 U.S.C. section 360bbb-3(b)(1), unless the authorization is terminated or revoked.  Performed at [facility], [address] [place], [place] [address]     Labs: CBC: Recent Labs  Lab 09/10/23 1034 09/11/23 0446  WBC 11.8* 10.1  HGB 8.2* 8.7*  HCT 25.3* 26.9*  MCV 81.9 82.0  PLT 312 235   Basic Metabolic Panel: Recent Labs  Lab 09/10/23 1034 09/11/23 0446 09/12/23 0540  NA 135 135 138  K 3.9 3.2* 3.4*  CL 93* 93* 99  CO2 26 26 28   GLUCOSE 118* 109* 87  BUN 29* 27* 22  CREATININE 1.11 1.00 0.71  CALCIUM   8.1* 7.9* 7.4*  MG  --  1.1* 2.0  PHOS  --   --  2.5   Liver Function Tests: Recent Labs  Lab 09/10/23 1034  AST 19  ALT 11  ALKPHOS 56  BILITOT 0.9  PROT 7.0  ALBUMIN  2.7*   CBG: Recent Labs  Lab 09/12/23 1209  GLUCAP 68*    Discharge time spent: greater than 30 minutes.  Signed: Donaciano Frizzle, MD [facility] 09/13/2023

## 2023-09-14 ENCOUNTER — Other Ambulatory Visit: Payer: Self-pay

## 2023-09-14 DIAGNOSIS — C181 Malignant neoplasm of appendix: Secondary | ICD-10-CM | POA: Diagnosis not present

## 2023-09-14 DIAGNOSIS — K56609 Unspecified intestinal obstruction, unspecified as to partial versus complete obstruction: Secondary | ICD-10-CM | POA: Diagnosis not present

## 2023-09-14 DIAGNOSIS — K922 Gastrointestinal hemorrhage, unspecified: Secondary | ICD-10-CM | POA: Diagnosis not present

## 2023-09-14 MED ORDER — MORPHINE SULFATE (CONCENTRATE) 20 MG/ML PO SOLN
5.0000 mg | ORAL | 0 refills | Status: DC | PRN
  Filled 2023-09-14: qty 20, 7d supply, fill #0

## 2023-09-14 MED ORDER — MORPHINE SULFATE (CONCENTRATE) 20 MG/ML PO SOLN
5.0000 mg | ORAL | 0 refills | Status: DC | PRN
  Filled 2023-09-14: qty 30, 10d supply, fill #0

## 2023-09-14 MED ORDER — ALUM & MAG HYDROXIDE-SIMETH 200-200-20 MG/5ML PO SUSP
15.0000 mL | Freq: Four times a day (QID) | ORAL | Status: DC | PRN
  Administered 2023-09-14 (×2): 15 mL via ORAL
  Filled 2023-09-14 (×2): qty 30

## 2023-09-14 MED ORDER — HEPARIN SOD (PORK) LOCK FLUSH 100 UNIT/ML IV SOLN
500.0000 [IU] | Freq: Once | INTRAVENOUS | Status: AC
  Administered 2023-09-14: 500 [IU] via INTRAVENOUS
  Filled 2023-09-14: qty 5

## 2023-09-14 NOTE — Discharge Summary (Signed)
 Physician Discharge Summary   Patient: Patrick Bailey. MRN: [id number] DOB: [birth year]  Admit date:     09/10/2023  Discharge date: 09/14/23  Discharge Physician: Patrick Bailey   PCP: Patrick Boyden, MD   Recommendations at discharge:   Follow-up with hospice as outpatient.  Discharge Diagnoses: Principal Problem:   Upper GI bleed Active Problems:   Alcohol abuse   Appendix carcinoma (HCC)   Abdominal pain   Protein-calorie malnutrition, severe   Essential hypertension   History of cerebellar stroke   GERD (gastroesophageal reflux disease)   HLD (hyperlipidemia)   Depression with anxiety   Hypokalemia   Hypomagnesemia   COPD (chronic obstructive pulmonary disease) (HCC)   Hypothyroidism   Hypertriglyceridemia   BPH (benign prostatic hyperplasia)   Immunocompromised (HCC)   SBO (small bowel obstruction) (HCC)   Failure to thrive in adult   Rash  Resolved Problems:   * No resolved hospital problems. [facility] Course: [name] is a 63 year old male with history of appendiceal carcinoma with metastasis, peritoneal carcinomatosis, history of left CVA in December 2023, hypertension, hyperlipidemia, depression, anxiety, failure to thrive, GERD, hypothyroid, who presents emergency department for chief concerns of coffee-ground emesis. CT abdomen/pelvis showed small bowel obstructions at multiple levels, distended stomach. Patient condition is terminal, for comfort, patient was started on NG suction, IV fluids, and the pain medicine. After long discussion with the family, decision was made to transfer patient back to hospice.   Patient is made a DNR.  Patient will not be able to eat, will attempt to place to vent PEG, but does not seem to be appropriate for GI and IR.  At this point, does not seem to offer, patient will be discharged home with hospice.   Assessment and Plan: Adenocarcinoma of appendix with metastasis. Small bowel obstruction secondary to cancer  metastasis. Gastric outlet obstruction secondary to cancer metastasis. * Upper GI bleed  Acute blood loss anemia s/p PRBC. Patient has a terminal cancer, which has caused significant bowel obstruction.  Patient has a large amount of gastric content which caused discomfort.  NG tube placed to suction it out.  Afterwards, patient be only placed on liquid diet tomorrow. Protonix was started for GI bleed.  Patient also being seen by GI. Had a long discussion with patient and his sister, condition terminal.  There is no option for treatment.  Patient is dying from cancer.  We also talk about to not resuscitate status, but family and the patient does not want it. They insist to get IV fluids as patient has not been able to eat.  I explained to them that IV fluids will only slow down but not able to prevent the inevitable. Had a family meeting again, explained the futility of resuscitation, family had decided to go with DO NOT RESUSCITATE status. Discussed with palliative care, they will try to set up another agency for hospice.  We also tried to place the vent PEG, but it is determined not safe to perform. At this point, patient is set to go home with hospice.  Anticipating readmission to hospice versus the hospital pretty soon. Patient also likes to eat, but at this point, it is not possible.  I did order clear liquid diet for comfort. Patient was discharged yesterday, but could not go.  I sent out 3 prescriptions to [facility] pharmacy for liquid morphine, they do not have in their stock.  As result, patient could not discharge.  After researching with the pharmacy, morphine  can be filled in [facility] pharmacy.  Will deliver to patient before discharge.   Hyponatremia. Hypokalemia. Severe hypomagnesemia. Repleted potassium magnesium.  No additional treatment is needed.   Essential hypertension Hold off all blood pressure medicines.   Hypothyroidism Hold off Synthroid.   Rash Appeared at IV site, appear  to be improving.   Failure to thrive in adult Severe protein calorie malnutrition. Hypoglycemia secondary to poor p.o. intake. No treatment option.             Consultants: GI, IR Procedures performed: None  Disposition: Hospice care Diet recommendation:  Discharge Diet Orders (From admission, onward)     Start     Ordered   09/13/23 0000  Diet clear liquid        09/13/23 1033           Clear liquid diet DISCHARGE MEDICATION: Allergies as of 09/14/2023       Reactions   Hydrocodone Hives   Per pt able to tolerate hydromorphone        Medication List     STOP taking these medications    amitriptyline 100 MG tablet Commonly known as: ELAVIL   amLODipine 10 MG tablet Commonly known as: NORVASC   aspirin EC 81 MG tablet   atorvastatin 40 MG tablet Commonly known as: LIPITOR   calcium carbonate 500 MG chewable tablet Commonly known as: TUMS - dosed in mg elemental calcium   cephALEXin 500 MG capsule Commonly known as: KEFLEX   clopidogrel 75 MG tablet Commonly known as: PLAVIX   dexamethasone 2 MG tablet Commonly known as: DECADRON   diphenoxylate-atropine 2.5-0.025 MG tablet Commonly known as: LOMOTIL   docusate sodium 100 MG capsule Commonly known as: COLACE   fenofibrate 160 MG tablet   finasteride 5 MG tablet Commonly known as: PROSCAR   gabapentin 400 MG capsule Commonly known as: NEURONTIN   HYDROmorphone 4 MG tablet Commonly known as: DILAUDID   levothyroxine 150 MCG tablet Commonly known as: SYNTHROID   lipase/protease/amylase 16109 UNITS Cpep capsule Commonly known as: Creon   Lonsurf 15-6.14 MG tablet Generic drug: trifluridine-tipiracil   MAGnesium-Oxide 400 (240 Mg) MG tablet Generic drug: magnesium oxide   methocarbamol 500 MG tablet Commonly known as: ROBAXIN   metoCLOPramide 10 MG tablet Commonly known as: REGLAN   midodrine 10 MG tablet Commonly known as: PROAMATINE   Mitigare 0.6 MG Caps Generic  drug: Colchicine   multivitamin with minerals Tabs tablet   naloxone 4 MG/0.1ML Liqd nasal spray kit Commonly known as: NARCAN   ofloxacin 0.3 % OTIC solution Commonly known as: FLOXIN   ondansetron 4 MG tablet Commonly known as: Zofran   Oxycodone HCl 20 MG Tabs   polyethylene glycol 17 g packet Commonly known as: MIRALAX / GLYCOLAX   potassium chloride 10 MEQ tablet Commonly known as: KLOR-CON   prochlorperazine 10 MG tablet Commonly known as: COMPAZINE   sodium chloride 1 g tablet       TAKE these medications    alum & mag hydroxide-simeth 200-200-20 MG/5ML suspension Commonly known as: MAALOX/MYLANTA Take 30 mLs by mouth every 6 (six) hours as needed for indigestion or heartburn.   morphine 20 MG/ML concentrated solution Commonly known as: ROXANOL Place 0.25 mLs (5 mg total) under the tongue every 2 (two) hours as needed for severe pain (pain score 7-10).   pantoprazole 40 MG tablet Commonly known as: PROTONIX Take 1 tablet (40 mg total) by mouth daily.        Discharge  Exam: Filed Weights   09/11/23 0800  Weight: 70.3 kg   General exam: Appears calm and comfortable, severely malnourished. Respiratory system: Clear to auscultation. Respiratory effort normal. Cardiovascular system: S1 & S2 heard, RRR. No JVD, murmurs, rubs, gallops or clicks. No pedal edema. Gastrointestinal system: Abdomen is nondistended, soft and nontender. No organomegaly or masses felt. Normal bowel sounds heard. Central nervous system: Alert and oriented. No focal neurological deficits. Extremities: Symmetric 5 x 5 power. Skin: No rashes, lesions or ulcers Psychiatry: Judgement and insight appear normal. Mood & affect appropriate.    Condition at discharge:  poor, anticipating death in 2 to 4 weeks.  The results of significant diagnostics from this hospitalization (including imaging, microbiology, ancillary and laboratory) are listed below for reference.   Imaging  Studies: DG Abd Portable 1V Result Date: 09/11/2023 CLINICAL DATA:  [id number] Encounter for nasogastric (NG) tube placement [id number] EXAM: PORTABLE ABDOMEN - 1 VIEW COMPARISON:  08/18/2023 abdominal radiograph FINDINGS: Enteric tube terminates in proximal stomach with side port at the esophagogastric junction. Surgical clip in the left upper abdomen. Moderate colonic gas. No dilated small bowel loops, pneumatosis or pneumoperitoneum in the upper abdomen. Patchy opacities at both lung bases. Superior approach central venous catheter terminates over the cavoatrial junction. Marked thoracolumbar spondylosis. IMPRESSION: Enteric tube terminates in the proximal stomach with side port at the esophagogastric junction, consider 3-5 cm advancement. Patchy opacities at both lung bases, consider chest radiograph correlation. Electronically Signed   By: Delbert Phenix M.D.   On: 09/11/2023 12:04   CT ANGIO GI BLEED Result Date: 09/10/2023 CLINICAL DATA:  Hematemesis. History of metastatic mucinous appendiceal adenocarcinoma. Coffee ground emesis for 1 week. * Tracking Code: BO * EXAM: CTA ABDOMEN AND PELVIS WITHOUT AND WITH CONTRAST TECHNIQUE: Multidetector CT imaging of the abdomen and pelvis was performed using the standard protocol during bolus administration of intravenous contrast. Multiplanar reconstructed images and MIPs were obtained and reviewed to evaluate the vascular anatomy. RADIATION DOSE REDUCTION: This exam was performed according to the departmental dose-optimization program which includes automated exposure control, adjustment of the mA and/or kV according to patient size and/or use of iterative reconstruction technique. CONTRAST:  OMNIPAQUE IOHEXOL 350 MG/ML SOLN COMPARISON:  Abdomen pelvis CT 08/18/2023. FINDINGS: VASCULAR Aorta: Normal caliber aorta without aneurysm, dissection, vasculitis or significant stenosis. Mild to moderate atherosclerotic calcification. Celiac: Patent without evidence of aneurysm,  dissection, vasculitis or significant stenosis. SMA: Patent without evidence of aneurysm, dissection, vasculitis or significant stenosis. Renals: Both renal arteries are patent without evidence of aneurysm, dissection, vasculitis, fibromuscular dysplasia or significant stenosis. IMA: Patent without evidence of aneurysm, dissection, vasculitis or significant stenosis. Inflow: Patent without evidence of aneurysm, dissection, vasculitis or significant stenosis. Proximal Outflow: Bilateral common femoral and visualized portions of the superficial and profunda femoral arteries are patent without evidence of aneurysm, dissection, vasculitis or significant stenosis. Veins: No obvious venous abnormality within the limitations of this arterial phase study. Review of the MIP images confirms the above findings. NON-VASCULAR Lower chest: Fluid in the distal esophagus may be related to reflux or dysmotility. Diffuse bronchial wall thickening noted with innumerable tiny bilateral pulmonary nodules, right greater than left. There is consolidative airspace disease in the posterior right lower lobe with small right pleural effusion. The patchy basilar airspace disease was worse on a previous chest CTA from 08/24/2023. Hepatobiliary: No suspicious focal abnormality within the liver parenchyma. 11 mm ill-defined subtle hypodense lesion in the posterior right liver (35/13 was not visible on the previous exam  performed without intravenous contrast but is similar to a postcontrast CT of 07/04/2023. Subcapsular 19 x 10 mm posterior right hepatic lobe lesion on 29/13 was 29 x 22 mm previously (remeasured). Gallbladder is distended with some fluid around the gallbladder, nonspecific. Mild intrahepatic biliary duct prominence with no substantial extrahepatic biliary duct dilatation. Pancreas: Diffuse parenchymal atrophy without main duct dilatation. Spleen: No splenomegaly. No suspicious focal mass lesion. Adrenals/Urinary Tract: No adrenal  nodule or mass. Kidneys unremarkable. No evidence for hydroureter. Layering high density material is seen in the posterior right bladder lumen, potentially excreted contrast material although bladder stones not excluded. Stomach/Bowel: Stomach is markedly distended with food and fluid. Wall thickening noted in the distal stomach/antral region. Duodenum is fluid-filled and distended. Small bowel loops in the left abdomen measure up to 5.3 cm diameter. Diffuse small bowel wall thickening is noted in the abdomen with vascular congestion and areas of matted small bowel obscuring mesenteric fat in the central abdomen and upper pelvis. Right lower quadrant loculated rim enhancing fluid collection is similar to prior measuring 4.1 x 9.6 x 8.0 cm today (image 61/13). Ileocolic anastomosis noted anterior abdomen. Large colonic stool volume evident. No evidence for tear old phase contrast extravasation into the stomach or duodenum. No definite contrast extravasation in the small bowel or colon. Lymphatic: No retroperitoneal lymphadenopathy. No pelvic sidewall lymphadenopathy. Reproductive: The prostate gland and seminal vesicles are unremarkable. Other: Nodularity lung the anterior liver margin is compatible with metastatic disease. The diffuse ill-defined amorphous soft tissue in the central mesentery appears progressive in the interval. This may be related to metastatic disease or edema from potential small bowel obstruction. Musculoskeletal: No worrisome lytic or sclerotic osseous abnormality. Body wall edema noted lower abdomen and pelvis. Bilateral groin hernias contain only fat. IMPRESSION: 1. No evidence for arterial phase contrast extravasation in the stomach, small bowel, or colon to suggest active arterial bleeding. 2. Marked distention of the stomach with food and fluid. There is prominent wall thickening in the distal stomach with luminal narrowing. Imaging features raise concern for a component of gastric outlet  obstruction. Lower esophagus is fluid-filled suggesting reflux. 3. Duodenum is fluid-filled and distended. Small bowel loops in the left abdomen measure up to 5.3 cm diameter although other small bowel loops are decompressed. Diffuse small bowel wall thickening is noted in the abdomen with vascular congestion and areas of matted small bowel obscuring mesenteric fat in the central abdomen and upper pelvis. Diffuse ill-defined amorphous soft tissue in the central mesentery appears progressive in the interval. Imaging features compatible with metastatic disease. Given the markedly dilated small bowel loop in the left abdomen, small-bowel obstruction including a closed loop etiology cannot be excluded. 4. Right lower quadrant loculated rim enhancing fluid collection is similar to prior measuring 4.1 x 9.6 x 8.0 cm today. This could represent loculated ascites secondary to peritoneal/mesenteric metastatic disease. Abscess cannot be excluded. 5. Diffuse bronchial wall thickening with innumerable tiny bilateral pulmonary nodules, right greater than left. Imaging features/inflammatory etiology or metastatic involvement. There was prominent airspace disease in both lung bases on previous chest CT of 08/24/2023 and this has improved in the interval. 6. Small right pleural effusion. 7. Layering high density material in the posterior right bladder lumen, potentially excreted contrast material although bladder stones not excluded. 8. 11 mm ill-defined subtle hypodense lesion in the posterior right liver (35/13 was not visible on the previous exam performed without intravenous contrast but is similar to a postcontrast CT of 07/04/2023. Posterior right capsular  lesion along the liver with soft tissue nodularity along the anterior liver margin. Findings compatible with metastatic disease. 9.  Aortic Atherosclerosis (ICD10-I70.0). Electronically Signed   By: Kennith Center M.D.   On: 09/10/2023 16:05   CT Angio Chest Pulmonary  Embolism (PE) W or WO Contrast Result Date: 08/24/2023 CLINICAL DATA:  Mid chest pain, short of breath, negative D-dimer, history of colon cancer and peritoneal carcinomatosis EXAM: CT ANGIOGRAPHY CHEST WITH CONTRAST TECHNIQUE: Multidetector CT imaging of the chest was performed using the standard protocol during bolus administration of intravenous contrast. Multiplanar CT image reconstructions and MIPs were obtained to evaluate the vascular anatomy. RADIATION DOSE REDUCTION: This exam was performed according to the departmental dose-optimization program which includes automated exposure control, adjustment of the mA and/or kV according to patient size and/or use of iterative reconstruction technique. CONTRAST:  75mL OMNIPAQUE IOHEXOL 350 MG/ML SOLN COMPARISON:  11/21/2021, 08/18/2023 FINDINGS: Cardiovascular: This is a technically adequate evaluation of the pulmonary vasculature. No filling defects or pulmonary emboli. The heart is unremarkable without pericardial effusion. Aneurysm of the ascending thoracic aorta and aortic arch measuring up to 4 cm. Evaluation of the aortic lumen is limited due to timing of contrast bolus. There is atherosclerosis throughout the aorta and coronary vasculature. Right chest wall port via internal jugular approach tip within the superior vena cava. Mediastinum/Nodes: Fluid-filled esophagus likely due to reflux given the degree of gastric distension. Thyroid and trachea are unremarkable. Enlarged subcarinal lymph node measuring up to 13 mm, likely reactive. No other pathologic adenopathy. Lungs/Pleura: There is multifocal bilateral airspace disease, most pronounced within the lower lobes, not significantly changed since prior CT. There are small bilateral pleural effusions, right greater than left. No pneumothorax. Stable emphysema. Central airways are patent. Upper Abdomen: Moderate free fluid within the left upper quadrant. Soft tissue stranding within the omentum consistent with  previous history of peritoneal carcinomatosis. 3 cm capsular hypodensity along the inferolateral margin of the right lobe liver image 145/4 is not appreciably changed since prior study, but has developed since 09/28/2022 exam, concerning for capsular metastatic implant. Musculoskeletal: No acute or destructive bony abnormalities. Reconstructed images demonstrate no additional findings. Review of the MIP images confirms the above findings. IMPRESSION: 1. No evidence of pulmonary embolus. 2. Bilateral airspace disease, greatest within the lower lobes, consistent with multifocal pneumonia or widespread aspiration. 3. Small bilateral pleural effusions, right greater than left, new since recent abdominal CT. 4. Upper abdominal findings consistent with metastatic disease and peritoneal carcinomatosis. New free fluid within the left upper quadrant since recent abdominal CT. 5. 4 cm thoracic aortic aneurysm involving the ascending aorta and aortic arch. Recommend semi-annual imaging followup by CTA or MRA and referral to cardiothoracic surgery if not already obtained. This recommendation follows 2010 ACCF/AHA/AATS/ACR/ASA/SCA/SCAI/SIR/STS/SVM Guidelines for the Diagnosis and Management of Patients With Thoracic Aortic Disease. Circulation. 2010; 121: Z610-R60. Aortic aneurysm NOS (ICD10-I71.9) 6. Aortic Atherosclerosis (ICD10-I70.0) and Emphysema (ICD10-J43.9). 7. Subcarinal adenopathy, likely reactive. Electronically Signed   By: Sharlet Salina M.D.   On: 08/24/2023 22:08   MR BRAIN WO CONTRAST Result Date: 08/24/2023 CLINICAL DATA:  Initial evaluation for acute neuro deficit, stroke suspected. EXAM: MRI HEAD WITHOUT CONTRAST TECHNIQUE: Multiplanar, multiecho pulse sequences of the brain and surrounding structures were obtained without intravenous contrast. COMPARISON:  Prior brain MRI from 07/08/2023. FINDINGS: Brain: Cerebral volume within normal limits for age. Patchy T2/FLAIR hyperintensity involving the  periventricular deep white matter both cerebral hemispheres, consistent with chronic small vessel ischemic disease, mild in nature.  No evidence for acute or subacute ischemia. Gray-white matter differentiation maintained. No areas of chronic cortical infarction. No acute intracranial hemorrhage. Scattered areas of superficial siderosis noted about a few cortical sulci of the right cerebral hemisphere, most notably about the central sulcus (series 10, image 53). Findings consistent with prior subarachnoid hemorrhage at these locations, stable from prior. Few additional punctate chronic micro hemorrhages noted. No mass lesion, midline shift or mass effect. No hydrocephalus or extra-axial fluid collection. Pituitary gland and suprasellar region within normal limits. Vascular: Loss of normal flow void within the hypoplastic right vertebral artery, which could be related to slow flow and/or occlusion, stable from prior. Major intracranial vascular flow voids are otherwise maintained. Skull and upper cervical spine: Craniocervical junction within normal limits. Bone marrow signal intensity normal. No scalp soft tissue abnormality. Sinuses/Orbits: Globes and orbital soft tissues within normal limits. Mild mucosal thickening noted about the ethmoidal air cells and maxillary sinuses. Small right mastoid effusion, similar to prior, and of doubtful significance. Negative nasopharynx. Other: None. IMPRESSION: 1. No acute intracranial abnormality. 2. Mild chronic microvascular ischemic disease for age. 3. Scattered areas of superficial siderosis about a few cortical sulci of the right cerebral hemisphere, consistent with prior subarachnoid hemorrhage, stable from prior. 4. Loss of normal flow void within the hypoplastic right vertebral artery, which could be related to slow flow and/or occlusion, also stable from prior. Electronically Signed   By: Rise Mu M.D.   On: 08/24/2023 04:42   US Venous Img Lower  Unilateral Left (DVT) Result Date: 08/19/2023 CLINICAL DATA:  10026 Shortness of breath 10026. EXAM: Left LOWER EXTREMITY VENOUS DOPPLER ULTRASOUND TECHNIQUE: Gray-scale sonography with compression, as well as color and duplex ultrasound, were performed to evaluate the deep venous system(s) from the level of the common femoral vein through the popliteal and proximal calf veins. COMPARISON:  Ultrasound bilateral lower extremity venous Doppler 07/01/2022 FINDINGS: VENOUS Normal compressibility of the common femoral, superficial femoral, and popliteal veins, as well as the visualized calf veins. Visualized portions of profunda femoral vein and great saphenous vein unremarkable. No filling defects to suggest DVT on grayscale or color Doppler imaging. Doppler waveforms show normal direction of venous flow, normal respiratory plasticity and response to augmentation. OTHER None. Limitations: none IMPRESSION: Negative. Electronically Signed   By: Tish Frederickson M.D.   On: 08/19/2023 20:22   DG Abd 1 View Result Date: 08/18/2023 CLINICAL DATA:  NG tube placement EXAM: ABDOMEN - 1 VIEW COMPARISON:  CT 08/18/2023 FINDINGS: Enteric tube tip at the mid gastric region, side-port near GE junction. Air distension of bowel in the left upper quadrant IMPRESSION: Enteric tube tip at the mid gastric region, side-port near GE junction, further advancement could be considered for more optimal positioning. Electronically Signed   By: Jasmine Pang M.D.   On: 08/18/2023 21:01   CT ABDOMEN PELVIS WO CONTRAST Result Date: 08/18/2023 CLINICAL DATA:  Sepsis. Nausea and vomiting. Hypotension. One-week history of shortness of breath, weakness, constipation, abdominal pain, nausea, vomiting, and diarrhea. History of prostate cancer. EXAM: CT ABDOMEN AND PELVIS WITHOUT CONTRAST TECHNIQUE: Multidetector CT imaging of the abdomen and pelvis was performed following the standard protocol without IV contrast. RADIATION DOSE REDUCTION: This exam  was performed according to the departmental dose-optimization program which includes automated exposure control, adjustment of the mA and/or kV according to patient size and/or use of iterative reconstruction technique. COMPARISON:  07/04/2023 FINDINGS: Lower chest: Dense airspace consolidation in both lung bases, new since prior study. This is  likely to represent multifocal pneumonia. Aspiration would be a secondary consideration. The esophagus is fluid-filled with mild distention. This may be due to reflux or dysmotility. Hepatobiliary: Focal liver lesion in segment 6 measuring 2.5 cm diameter. This is better characterized on the previous contrast-enhanced study, likely representing a cyst. Increased density in the gallbladder could represent stones or sludge. No gallbladder wall thickening. No bile duct dilatation. Pancreas: Unremarkable. No pancreatic ductal dilatation or surrounding inflammatory changes. Spleen: Normal in size without focal abnormality. Adrenals/Urinary Tract: Adrenal glands are unremarkable. Kidneys are normal, without renal calculi, focal lesion, or hydronephrosis. Bladder is unremarkable. Stomach/Bowel: Stomach is distended with fluid. Suggestion of mild gastric wall thickening possibly gastritis. Small bowel are mostly decompressed with a few scattered gas-filled small bowel loops. Anastomosis in the anterior mid abdomen likely represents an ileocolic anastomosis. The colon is mildly distended with gas. There is mild gastric wall thickening which may indicate colitis. Vascular/Lymphatic: Calcification of the aorta. Scattered lymph nodes in the porta hepatis and gastrohepatic ligament are not pathologically enlarged, likely reactive. Reproductive: No pelvic mass or lymphadenopathy. Other: Loculated fluid collection in the right lower quadrant measuring up to about 5 x 10.1 cm. Collection appears roughly similar to the prior study. Decreased collection in the left pericolic gutter region  since prior study. Decreased free fluid in the abdomen and pelvis. No free air identified. Small right inguinal hernia containing fat. Calcification and scarring in the anterior abdominal wall is likely postoperative Musculoskeletal: Degenerative changes in the spine and hips. No acute bony abnormalities. IMPRESSION: 1. New development of dense consolidation in both lung bases likely representing multifocal pneumonia or possibly aspiration. 2. Stomach is distended with fluid. Gastric wall thickening suggesting gastritis. Fluid-filled lower esophagus may indicate reflux disease or esophagitis. 3. Gaseous distention of colon is progressing since prior study. Colonic wall thickening may indicate colitis. 4. Loculated fluid collection in the right lower quadrant is similar to prior study, possibly an abscess. Free fluid and left pericolic gutter fluid is decreasing since prior study. 5. Aortic atherosclerosis. Electronically Signed   By: Burman Nieves M.D.   On: 08/18/2023 18:18   DG Chest Portable 1 View Result Date: 08/18/2023 CLINICAL DATA:  63 year old male with possible sepsis. Weakness, shortness of breath, abdominal pain. History of prostate cancer. Central line placement. EXAM: PORTABLE CHEST 1 VIEW COMPARISON:  Portable chest 1257 hours today. FINDINGS: Portable AP semi upright view at 1510 hours. New left IJ approach central line in place, tip projects at the central line placement. Innominate vein confluence, upper SVC. Stable pre-existing right chest power port. No pneumothorax. Ongoing abnormal confluent right lung base opacity, and on this image the right hilum also appears abnormally enlarged, opacified. Lung markings and mediastinal contours elsewhere within normal limits. No pleural effusion. Stable visualized osseous structures. Negative visible bowel gas. IMPRESSION: 1. Left IJ central line placed, tip at the innominate vein confluence, upper SVC. No pneumothorax. 2. Abnormal right perihilar and  right lung base confluent opacity suspicious for pneumonia, aspiration in this setting. No pleural effusion. Electronically Signed   By: Odessa Fleming M.D.   On: 08/18/2023 15:24   DG Chest Port 1 View Result Date: 08/18/2023 CLINICAL DATA:  63 year old male with possible sepsis. Weakness, shortness of breath, abdominal pain. History of prostate cancer. EXAM: PORTABLE CHEST 1 VIEW COMPARISON:  Portable chest 10/02/2022 and earlier. FINDINGS: Portable AP semi upright view at 1257 hours. Similar patient rotation to the right. Chronic right chest Port-A-Cath. Stable lung volumes and mediastinal contours.  No pneumothorax, pulmonary edema, pleural effusion. But new patchy, reticulonodular right lung base opacity since last year. Similar bilateral upper lung predominant opacities at that time appear resolved. Small surgical clips at the right thoracic inlet. No acute osseous abnormality identified. IMPRESSION: Patchy right lung base opacity since last year suspicious for aspiration or pneumonia in this setting. No pleural effusion. Electronically Signed   By: Odessa Fleming M.D.   On: 08/18/2023 15:22    Microbiology: Results for orders placed or performed during the hospital encounter of 09/10/23  Resp panel by RT-PCR (RSV, Flu A&B, Covid) Anterior Nasal Swab     Status: None   Collection Time: 09/10/23  2:43 PM   Specimen: Anterior Nasal Swab  Result Value Ref Range Status   SARS Coronavirus 2 by RT PCR NEGATIVE NEGATIVE Final    Comment: (NOTE) SARS-CoV-2 target nucleic acids are NOT DETECTED.  The SARS-CoV-2 RNA is generally detectable in upper respiratory specimens during the acute phase of infection. The lowest concentration of SARS-CoV-2 viral copies this assay can detect is 138 copies/mL. A negative result does not preclude SARS-Cov-2 infection and should not be used as the sole basis for treatment or other patient management decisions. A negative result may occur with  improper specimen  collection/handling, submission of specimen other than nasopharyngeal swab, presence of viral mutation(s) within the areas targeted by this assay, and inadequate number of viral copies(<138 copies/mL). A negative result must be combined with clinical observations, patient history, and epidemiological information. The expected result is Negative.  Fact Sheet for Patients:  BloggerCourse.com  Fact Sheet for Healthcare Providers:  SeriousBroker.it  This test is no t yet approved or cleared by the [place] FDA and  has been authorized for detection and/or diagnosis of SARS-CoV-2 by FDA under an Emergency Use Authorization (EUA). This EUA will remain  in effect (meaning this test can be used) for the duration of the COVID-19 declaration under Section 564(b)(1) of the Act, 21 U.S.C.section 360bbb-3(b)(1), unless the authorization is terminated  or revoked sooner.       Influenza A by PCR NEGATIVE NEGATIVE Final   Influenza B by PCR NEGATIVE NEGATIVE Final    Comment: (NOTE) The Xpert Xpress SARS-CoV-2/FLU/RSV plus assay is intended as an aid in the diagnosis of influenza from Nasopharyngeal swab specimens and should not be used as a sole basis for treatment. Nasal washings and aspirates are unacceptable for Xpert Xpress SARS-CoV-2/FLU/RSV testing.  Fact Sheet for Patients: BloggerCourse.com  Fact Sheet for Healthcare Providers: SeriousBroker.it  This test is not yet approved or cleared by the [place] FDA and has been authorized for detection and/or diagnosis of SARS-CoV-2 by FDA under an Emergency Use Authorization (EUA). This EUA will remain in effect (meaning this test can be used) for the duration of the COVID-19 declaration under Section 564(b)(1) of the Act, 21 U.S.C. section 360bbb-3(b)(1), unless the authorization is terminated or revoked.     Resp Syncytial  Virus by PCR NEGATIVE NEGATIVE Final    Comment: (NOTE) Fact Sheet for Patients: BloggerCourse.com  Fact Sheet for Healthcare Providers: SeriousBroker.it  This test is not yet approved or cleared by the [place] FDA and has been authorized for detection and/or diagnosis of SARS-CoV-2 by FDA under an Emergency Use Authorization (EUA). This EUA will remain in effect (meaning this test can be used) for the duration of the COVID-19 declaration under Section 564(b)(1) of the Act, 21 U.S.C. section 360bbb-3(b)(1), unless the authorization is terminated or revoked.  Performed at  [facility], [address] [place], [place] [address]     Labs: CBC: Recent Labs  Lab 09/10/23 1034 09/11/23 0446  WBC 11.8* 10.1  HGB 8.2* 8.7*  HCT 25.3* 26.9*  MCV 81.9 82.0  PLT 312 235   Basic Metabolic Panel: Recent Labs  Lab 09/10/23 1034 09/11/23 0446 09/12/23 0540  NA 135 135 138  K 3.9 3.2* 3.4*  CL 93* 93* 99  CO2 26 26 28   GLUCOSE 118* 109* 87  BUN 29* 27* 22  CREATININE 1.11 1.00 0.71  CALCIUM 8.1* 7.9* 7.4*  MG  --  1.1* 2.0  PHOS  --   --  2.5   Liver Function Tests: Recent Labs  Lab 09/10/23 1034  AST 19  ALT 11  ALKPHOS 56  BILITOT 0.9  PROT 7.0  ALBUMIN 2.7*   CBG: Recent Labs  Lab 09/12/23 1209  GLUCAP 68*    Discharge time spent: greater than 30 minutes.  Signed: Marrion Coy, MD [facility] 09/14/2023

## 2023-09-14 NOTE — TOC Transition Note (Signed)
Transition of Care [facility]) - Discharge Note   Patient Details  Name: Patrick Bailey. MRN: [id number] Date of Birth: 1961/07/12  [facility] CM/SW Contact:  Allena Katz, LCSW Phone Number: 09/14/2023, 12:41 PM   Clinical Narrative:  Pt discharging home with [facility] home hosp[facility].    Final next level of care: Home w Hospice Care Barriers to Discharge: Barriers Resolved   Patient Goals and CMS Choice            Discharge Placement                Patient to be transferred to facility by: Family   Patient and family notified of of transfer: 09/14/23  Discharge Plan and Services Additional resources added to the After Visit Summary for                                       Social Drivers of Health (SDOH) Interventions SDOH Screenings   Food Insecurity: No Food Insecurity (09/11/2023)  Housing: Low Risk  (09/11/2023)  Transportation Needs: No Transportation Needs (09/11/2023)  Utilities: Not At Risk (09/11/2023)  Depression (PHQ2-9): Medium Risk (01/06/2022)  Financial Resource Strain: Low Risk  (12/15/2021)   Received from [facility]  Tobacco Use: Medium Risk (09/11/2023)  Health Literacy: Low Risk  (12/15/2021)   Received from [facility]     Readmission Risk Interventions    08/24/2023    3:35 PM 07/06/2023   10:49 AM  Readmission Risk Prevention Plan  Transportation Screening Complete Complete  Medication Review (RN Care Manager) Complete Complete  PCP or Specialist appointment within 3-5 days of discharge Complete Complete  HRI or Home Care Consult Complete Complete  SW Recovery Care/Counseling Consult Complete Complete  Palliative Care Screening Not Applicable Not Applicable  Skilled Nursing Facility Not Applicable Not Complete  SNF Comments  patient not agreeable too SNF

## 2023-09-14 NOTE — Consult Note (Signed)
[facility] Liaison Consult Note   09/14/2023  Kashus Karlen 1960-10-25 [id number]  *Druscilla Brownie RN [facility] Liaison rounding remote review at [facility]  Insurance: [facility] [place] [employer]  Primary Care Provider: Eustaquio Boyden, MD, with [place] at [facility], this provider office is listed for the Transition of Care follow up and calls.  Chart reviewed  for less than 30 days readmission with extreme high risk scores for unplanned readmission.  Also, reviewed from [facility] consult notes and reveals the patient has been in [facility].  Plan: If patient transitions to hospice care  then patient will have full care coordination services through Hospice and needs will be met at the hospice level of care. No planned follow up at this time for transitional needs. Will review for final disposition.  Will sign off at transition from hospital.  For questions,   Charlesetta Shanks, RN, BSN, CCM [place]  [facility], [facility] Liaison Direct Dial: [phone] or secure chat Email: [email or web address]

## 2023-09-14 NOTE — Progress Notes (Signed)
  [facility] Liaison note:  Patient discharging home today.  [facility] referrals team notified of discharge. Nursing visit set for 3pm today.    Please call for any hospice related questions or concerns.   [facility] Liaison [phone]

## 2023-09-16 MED ORDER — DEXAMETHASONE 2 MG TABLET
ORAL_TABLET | Freq: Every day | ORAL | 4 refills | 30.00 days
Start: 2023-09-16 — End: ?

## 2023-09-22 DIAGNOSIS — C786 Secondary malignant neoplasm of retroperitoneum and peritoneum: Principal | ICD-10-CM

## 2023-09-22 DIAGNOSIS — C181 Malignant neoplasm of appendix: Principal | ICD-10-CM

## 2023-09-23 ENCOUNTER — Telehealth (HOSPITAL_COMMUNITY): Payer: Self-pay

## 2023-09-23 ENCOUNTER — Other Ambulatory Visit (HOSPITAL_COMMUNITY): Payer: Self-pay | Admitting: Interventional Radiology

## 2023-09-23 DIAGNOSIS — C181 Malignant neoplasm of appendix: Principal | ICD-10-CM

## 2023-09-23 DIAGNOSIS — C786 Secondary malignant neoplasm of retroperitoneum and peritoneum: Principal | ICD-10-CM

## 2023-09-23 DIAGNOSIS — I771 Stricture of artery: Secondary | ICD-10-CM

## 2023-09-23 NOTE — Telephone Encounter (Signed)
Called to schedule US carotid, no answer, no vm. AB

## 2023-10-02 DEATH — deceased

## 2024-01-28 ENCOUNTER — Encounter (HOSPITAL_COMMUNITY): Payer: Self-pay | Admitting: Interventional Radiology

## 2024-06-09 ENCOUNTER — Other Ambulatory Visit: Payer: BC Managed Care – PPO

## 2024-06-16 ENCOUNTER — Encounter: Payer: BC Managed Care – PPO | Admitting: Family Medicine
